# Patient Record
Sex: Male | Born: 1935 | Race: White | Hispanic: No | Marital: Married | Smoking: Former smoker
Health system: Southern US, Community
[De-identification: ages and names within clinical notes are randomized; demographics above are authoritative.]

## PROBLEM LIST (undated history)

## (undated) DIAGNOSIS — J90 Pleural effusion, not elsewhere classified: Secondary | ICD-10-CM

## (undated) DIAGNOSIS — I330 Acute and subacute infective endocarditis: Secondary | ICD-10-CM

## (undated) DIAGNOSIS — I456 Pre-excitation syndrome: Secondary | ICD-10-CM

## (undated) DIAGNOSIS — Z950 Presence of cardiac pacemaker: Secondary | ICD-10-CM

## (undated) DIAGNOSIS — D509 Iron deficiency anemia, unspecified: Secondary | ICD-10-CM

## (undated) DIAGNOSIS — E349 Endocrine disorder, unspecified: Secondary | ICD-10-CM

## (undated) DIAGNOSIS — Z951 Presence of aortocoronary bypass graft: Secondary | ICD-10-CM

## (undated) DIAGNOSIS — D693 Immune thrombocytopenic purpura: Secondary | ICD-10-CM

## (undated) DIAGNOSIS — Z954 Presence of other heart-valve replacement: Secondary | ICD-10-CM

## (undated) DIAGNOSIS — I351 Nonrheumatic aortic (valve) insufficiency: Secondary | ICD-10-CM

## (undated) DIAGNOSIS — G4733 Obstructive sleep apnea (adult) (pediatric): Secondary | ICD-10-CM

## (undated) DIAGNOSIS — R32 Unspecified urinary incontinence: Principal | ICD-10-CM

## (undated) DIAGNOSIS — Z9889 Other specified postprocedural states: Secondary | ICD-10-CM

## (undated) DIAGNOSIS — I5033 Acute on chronic diastolic (congestive) heart failure: Secondary | ICD-10-CM

## (undated) DIAGNOSIS — I10 Essential (primary) hypertension: Secondary | ICD-10-CM

## (undated) DIAGNOSIS — Z953 Presence of xenogenic heart valve: Secondary | ICD-10-CM

## (undated) DIAGNOSIS — E785 Hyperlipidemia, unspecified: Secondary | ICD-10-CM

## (undated) DIAGNOSIS — D61818 Other pancytopenia: Principal | ICD-10-CM

## (undated) DIAGNOSIS — R739 Hyperglycemia, unspecified: Secondary | ICD-10-CM

## (undated) DIAGNOSIS — Z952 Presence of prosthetic heart valve: Secondary | ICD-10-CM

## (undated) DIAGNOSIS — N189 Chronic kidney disease, unspecified: Secondary | ICD-10-CM

## (undated) DIAGNOSIS — E870 Hyperosmolality and hypernatremia: Secondary | ICD-10-CM

## (undated) DIAGNOSIS — I2510 Atherosclerotic heart disease of native coronary artery without angina pectoris: Secondary | ICD-10-CM

## (undated) DIAGNOSIS — M1990 Unspecified osteoarthritis, unspecified site: Secondary | ICD-10-CM

## (undated) DIAGNOSIS — N179 Acute kidney failure, unspecified: Secondary | ICD-10-CM

## (undated) DIAGNOSIS — Z8679 Personal history of other diseases of the circulatory system: Secondary | ICD-10-CM

## (undated) DIAGNOSIS — D72819 Decreased white blood cell count, unspecified: Secondary | ICD-10-CM

## (undated) DIAGNOSIS — N390 Urinary tract infection, site not specified: Secondary | ICD-10-CM

## (undated) DIAGNOSIS — K909 Intestinal malabsorption, unspecified: Secondary | ICD-10-CM

## (undated) DIAGNOSIS — R42 Dizziness and giddiness: Secondary | ICD-10-CM

## (undated) DIAGNOSIS — I219 Acute myocardial infarction, unspecified: Secondary | ICD-10-CM

## (undated) DIAGNOSIS — I441 Atrioventricular block, second degree: Secondary | ICD-10-CM

## (undated) DIAGNOSIS — N183 Chronic kidney disease, stage 3 (moderate): Secondary | ICD-10-CM

## (undated) DIAGNOSIS — I4891 Unspecified atrial fibrillation: Secondary | ICD-10-CM

## (undated) DIAGNOSIS — Z8619 Personal history of other infectious and parasitic diseases: Secondary | ICD-10-CM

## (undated) DIAGNOSIS — D649 Anemia, unspecified: Secondary | ICD-10-CM

## (undated) DIAGNOSIS — D696 Thrombocytopenia, unspecified: Secondary | ICD-10-CM

## (undated) HISTORY — DX: Personal history of other infectious and parasitic diseases: Z86.19

## (undated) HISTORY — DX: Endocrine disorder, unspecified: E34.9

## (undated) HISTORY — DX: Essential (primary) hypertension: I10

## (undated) HISTORY — DX: Other pancytopenia: D61.818

## (undated) HISTORY — PX: CORONARY ANGIOPLASTY WITH STENT PLACEMENT: SHX49

## (undated) HISTORY — DX: Intestinal malabsorption, unspecified: K90.9

## (undated) HISTORY — DX: Unspecified atrial fibrillation: I48.91

## (undated) HISTORY — DX: Decreased white blood cell count, unspecified: D72.819

## (undated) HISTORY — DX: Thrombocytopenia, unspecified: D69.6

## (undated) HISTORY — DX: Anemia, unspecified: D64.9

## (undated) HISTORY — DX: Obstructive sleep apnea (adult) (pediatric): G47.33

## (undated) HISTORY — DX: Iron deficiency anemia, unspecified: D50.9

## (undated) HISTORY — DX: Atrioventricular block, second degree: I44.1

## (undated) HISTORY — DX: Hyperosmolality and hypernatremia: E87.0

## (undated) HISTORY — DX: Pre-excitation syndrome: I45.6

## (undated) HISTORY — DX: Immune thrombocytopenic purpura: D69.3

## (undated) HISTORY — DX: Dizziness and giddiness: R42

## (undated) HISTORY — DX: Atherosclerotic heart disease of native coronary artery without angina pectoris: I25.10

## (undated) HISTORY — DX: Hyperglycemia, unspecified: R73.9

## (undated) HISTORY — DX: Unspecified urinary incontinence: R32

## (undated) HISTORY — PX: OTHER SURGICAL HISTORY: SHX169

## (undated) HISTORY — DX: Hyperlipidemia, unspecified: E78.5

---

## 1999-05-10 ENCOUNTER — Encounter: Payer: Self-pay | Admitting: Emergency Medicine

## 1999-05-10 ENCOUNTER — Inpatient Hospital Stay (HOSPITAL_COMMUNITY): Admission: EM | Admit: 1999-05-10 | Discharge: 1999-05-11 | Payer: Self-pay | Admitting: Emergency Medicine

## 1999-05-11 ENCOUNTER — Encounter: Payer: Self-pay | Admitting: Cardiovascular Disease

## 2005-09-27 LAB — HM COLONOSCOPY

## 2012-08-12 ENCOUNTER — Encounter: Payer: Self-pay | Admitting: Cardiology

## 2012-08-12 ENCOUNTER — Encounter: Payer: Self-pay | Admitting: *Deleted

## 2012-08-12 DIAGNOSIS — E785 Hyperlipidemia, unspecified: Secondary | ICD-10-CM | POA: Insufficient documentation

## 2012-08-12 DIAGNOSIS — I10 Essential (primary) hypertension: Secondary | ICD-10-CM | POA: Insufficient documentation

## 2012-08-12 DIAGNOSIS — I2510 Atherosclerotic heart disease of native coronary artery without angina pectoris: Secondary | ICD-10-CM | POA: Insufficient documentation

## 2012-08-13 ENCOUNTER — Ambulatory Visit (INDEPENDENT_AMBULATORY_CARE_PROVIDER_SITE_OTHER): Payer: Medicare Other | Admitting: Cardiology

## 2012-08-13 ENCOUNTER — Encounter: Payer: Self-pay | Admitting: Cardiology

## 2012-08-13 VITALS — BP 122/70 | HR 49 | Ht 71.0 in | Wt 189.0 lb

## 2012-08-13 DIAGNOSIS — E785 Hyperlipidemia, unspecified: Secondary | ICD-10-CM

## 2012-08-13 DIAGNOSIS — I10 Essential (primary) hypertension: Secondary | ICD-10-CM

## 2012-08-13 DIAGNOSIS — I679 Cerebrovascular disease, unspecified: Secondary | ICD-10-CM

## 2012-08-13 DIAGNOSIS — R002 Palpitations: Secondary | ICD-10-CM

## 2012-08-13 DIAGNOSIS — I2510 Atherosclerotic heart disease of native coronary artery without angina pectoris: Secondary | ICD-10-CM

## 2012-08-13 MED ORDER — NADOLOL 20 MG PO TABS: ORAL_TABLET | ORAL | Status: DC

## 2012-08-13 NOTE — Assessment & Plan Note (Signed)
If he has recurrent episodes in the future we'll plan CardioNet.

## 2012-08-13 NOTE — Assessment & Plan Note (Signed)
Continue aspirin and statin. Followup carotid Dopplers February 2014. 

## 2012-08-13 NOTE — Patient Instructions (Addendum)
Your physician wants you to follow-up in: 6 MONTHS WITH DR Jens Som You will receive a reminder letter in the mail two months in advance. If you don't receive a letter, please call our office to schedule the follow-up appointment.   DECREASE NADOLOL TO 5 MG ONCE DAILY= 1/4 OF 20 MG TABLET  Your physician has requested that you have a carotid duplex. This test is an ultrasound of the carotid arteries in your neck. It looks at blood flow through these arteries that supply the brain with blood. Allow one hour for this exam. There are no restrictions or special instructions. DUE IN [place]

## 2012-08-13 NOTE — Progress Notes (Signed)
HPI: 78 year old male for evaluation of coronary artery disease. Patient previously resided in [place] and recently moved here and would like to establish care. The patient had a previous LAD infarct and had PCI of his LAD in April of 1999. Stress echocardiogram in June of 2011 showed no ischemia at some pressure or heart rate with an ejection fraction of 55-60%. Carotid Dopplers in February 2013 showed less than 50% stenosis of the right internal carotid artery and less than 50% stenosis of the left. Patient has dyspnea with more extreme activities but not routine activities. No orthopnea, PND, pedal edema, claudication, syncope or chest pain. He has had one episode of palpitations that lasted several minutes and resolve spontaneously. No associated syncope.  Current Outpatient Prescriptions  Medication Sig Dispense Refill  . aspirin 325 MG tablet Take 325 mg by mouth daily.      Marland Kitchen atorvastatin (LIPITOR) 10 MG tablet Take 10 mg by mouth daily.      . Cholecalciferol (VITAMIN D3) 2000 UNITS TABS Take 1 tablet by mouth daily.      . Cyanocobalamin (VITAMIN B-12 CR PO) Take 1 tablet by mouth daily.      . Multiple Vitamin (MULTIVITAMIN) capsule Take 1 capsule by mouth daily.      . nadolol (CORGARD) 20 MG tablet 1/2 tab po qd      . nitroGLYCERIN (NITROSTAT) 0.4 MG SL tablet Place 0.4 mg under the tongue every 5 (five) minutes as needed.      . Pyridoxine HCl (VITAMIN B-6 PO) Take 1 tablet by mouth daily.        No Known Allergies  Past Medical History  Diagnosis Date  . CAD (coronary artery disease)   . Dyslipidemia   . HTN (hypertension)   . WPW (Wolff-Parkinson-White syndrome)     Past Surgical History  Procedure Date  . Coronary angioplasty with stent placement   . Vastectomy     History   Social History  . Marital Status: Married    Spouse Name: N/A    Number of Children: 3  . Years of Education: N/A   Occupational History  .      Retired   Social History Main Topics    . Smoking status: Former Games developer  . Smokeless tobacco: Not on file  . Alcohol Use: Yes     Occasional  . Drug Use: No  . Sexually Active: Not on file   Other Topics Concern  . Not on file   Social History Narrative  . No narrative on file    Family History  Problem Relation Age of Onset  . Hypertension Father   . Stroke Mother   . Coronary artery disease Sister     MI at age 36    ROS:  no fevers or chills, productive cough, hemoptysis, dysphasia, odynophagia, melena, hematochezia, dysuria, hematuria, rash, seizure activity, orthopnea, PND, pedal edema, claudication. Remaining systems are negative.  Physical Exam:   Blood pressure 122/70, pulse 49, height 5\' 11"  (1.803 m), weight 189 lb (85.73 kg).  General:  Well developed/well nourished in NAD Skin warm/dry Patient not depressed No peripheral clubbing Back-normal HEENT-normal/normal eyelids Neck supple/normal carotid upstroke bilaterally; no bruits; no JVD; no thyromegaly chest - CTA/ normal expansion CV - RRR/normal S1 and S2; no murmurs, rubs or gallops;  PMI nondisplaced Abdomen -NT/ND, no HSM, no mass, + bowel sounds, no bruit 2+ femoral pulses, no bruits Ext-no edema, chords, 2+ DP Neuro-grossly nonfocal  ECG marked sinus bradycardia at  a rate of 49. First degree AV block. Incomplete right bundle branch block. No significant ST changes.

## 2012-08-13 NOTE — Assessment & Plan Note (Signed)
Continue aspirin and statin. We'll plan functional study in one year.

## 2012-08-13 NOTE — Assessment & Plan Note (Signed)
Continue statin. 

## 2012-08-13 NOTE — Assessment & Plan Note (Signed)
Blood pressure is controlled. However he has a very long first degree AV block on his electrocardiogram and his heart rate is 49. Decrease nadolol to 5 mg daily. Follow blood pressure and add additional medications as needed.

## 2012-08-15 ENCOUNTER — Encounter: Payer: Self-pay | Admitting: Cardiology

## 2012-09-25 ENCOUNTER — Telehealth: Payer: Self-pay | Admitting: Cardiology

## 2012-09-25 MED ORDER — NITROGLYCERIN 0.4 MG SL SUBL: 0.4000 mg | SUBLINGUAL_TABLET | SUBLINGUAL | Status: DC | PRN

## 2012-09-25 NOTE — Telephone Encounter (Signed)
Pt needs refill of nitro, lost his bottle, uses [facility]

## 2012-09-27 ENCOUNTER — Other Ambulatory Visit: Payer: Self-pay | Admitting: Cardiology

## 2012-09-27 MED ORDER — NITROGLYCERIN 0.4 MG SL SUBL: 0.4000 mg | SUBLINGUAL_TABLET | SUBLINGUAL | Status: DC | PRN

## 2012-09-27 NOTE — Telephone Encounter (Signed)
Plz resend pt RX to [facility] ASAP, original request from 09/25/12 does not have a preferred pharmacy attached to the order, pt can be reached at hm# for additional info if needed

## 2012-10-01 ENCOUNTER — Telehealth: Payer: Self-pay | Admitting: Cardiology

## 2012-10-01 MED ORDER — NITROGLYCERIN 0.4 MG SL SUBL: 0.4000 mg | SUBLINGUAL_TABLET | SUBLINGUAL | Status: DC | PRN

## 2012-10-01 NOTE — Telephone Encounter (Signed)
k

## 2013-02-20 ENCOUNTER — Telehealth: Payer: Self-pay | Admitting: Cardiology

## 2013-02-20 NOTE — Telephone Encounter (Signed)
New problem   Pt need to speak with you concerning a form he received that he needed to have a carotid test done. Pt would like to speak to you about this.

## 2013-02-20 NOTE — Telephone Encounter (Signed)
Pt scheduled for carotid doppler study as ordered and f/u with Dr Jens Som in the [facility] office

## 2013-03-03 ENCOUNTER — Encounter (INDEPENDENT_AMBULATORY_CARE_PROVIDER_SITE_OTHER): Payer: Medicare Other

## 2013-03-03 DIAGNOSIS — I679 Cerebrovascular disease, unspecified: Secondary | ICD-10-CM

## 2013-03-05 ENCOUNTER — Ambulatory Visit (INDEPENDENT_AMBULATORY_CARE_PROVIDER_SITE_OTHER): Payer: Medicare Other | Admitting: Cardiology

## 2013-03-05 ENCOUNTER — Encounter: Payer: Self-pay | Admitting: Cardiology

## 2013-03-05 VITALS — BP 130/80 | HR 53 | Wt 192.0 lb

## 2013-03-05 DIAGNOSIS — R55 Syncope and collapse: Secondary | ICD-10-CM | POA: Insufficient documentation

## 2013-03-05 DIAGNOSIS — I10 Essential (primary) hypertension: Secondary | ICD-10-CM

## 2013-03-05 DIAGNOSIS — I2510 Atherosclerotic heart disease of native coronary artery without angina pectoris: Secondary | ICD-10-CM

## 2013-03-05 MED ORDER — ATORVASTATIN CALCIUM 10 MG PO TABS: 10.0000 mg | ORAL_TABLET | Freq: Every day | ORAL | Status: DC

## 2013-03-05 MED ORDER — AMLODIPINE BESYLATE 5 MG PO TABS: 5.0000 mg | ORAL_TABLET | Freq: Every day | ORAL | Status: DC

## 2013-03-05 NOTE — Assessment & Plan Note (Signed)
Etiology unclear. However I am concerned about the possibility of bradycardia mediated events. He has an extremely long first degree AV block on his electrocardiogram. Discontinue nadalol. Schedule CardioNet. I have asked him to allow his wife to drive while his monitor is in place.

## 2013-03-05 NOTE — Assessment & Plan Note (Addendum)
Continue aspirin and statin. Schedule stress Cardiolite for risk stratification.

## 2013-03-05 NOTE — Progress Notes (Signed)
HPI: Pleasant male for fu of coronary artery disease. The patient had a previous LAD infarct and had PCI of his LAD in April of 1999. Stress echocardiogram in June of 2011 showed no ischemia with an ejection fraction of 55-60%. Carotid Dopplers in February 2013 showed less than 50% stenosis of the right internal carotid artery and less than 50% stenosis of the left. I last saw him in Sept 2013. Since then, the patient has had presyncope recently. He had 3 separate episodes while in [place]. Each episode occurred suddenly and was described as pressure in his head with a machine sound in his ears. He felt like he was going to pass out and it would resolve in 2-3 seconds. He has not had frank syncope. He otherwise denies dyspnea on exertion, orthopnea, PND, pedal edema, palpitations.   Current Outpatient Prescriptions  Medication Sig Dispense Refill  . aspirin 325 MG tablet Take 325 mg by mouth daily.      Marland Kitchen atorvastatin (LIPITOR) 10 MG tablet Take 10 mg by mouth daily.      . Cholecalciferol (VITAMIN D3) 2000 UNITS TABS Take 1 tablet by mouth daily.      . Cyanocobalamin (VITAMIN B-12 CR PO) Take 1 tablet by mouth daily.      Marland Kitchen loratadine (CLARITIN) 10 MG tablet Take 10 mg by mouth daily.      . Multiple Vitamin (MULTIVITAMIN) capsule Take 1 capsule by mouth daily.      . nadolol (CORGARD) 20 MG tablet 1/4 tab po qd      . nitroGLYCERIN (NITROSTAT) 0.4 MG SL tablet Place 1 tablet (0.4 mg total) under the tongue every 5 (five) minutes as needed.  25 tablet  12  . Oxymetazoline HCl (NASAL SPRAY NA) Place into the nose as directed.      . Pyridoxine HCl (VITAMIN B-6 PO) Take 1 tablet by mouth daily.       No current facility-administered medications for this visit.     Past Medical History  Diagnosis Date  . CAD (coronary artery disease)   . Dyslipidemia   . HTN (hypertension)   . WPW (Wolff-Parkinson-White syndrome)     Past Surgical History  Procedure Laterality Date  . Coronary  angioplasty with stent placement    . Vastectomy      History   Social History  . Marital Status: Married    Spouse Name: N/A    Number of Children: 3  . Years of Education: N/A   Occupational History  .      Retired   Social History Main Topics  . Smoking status: Former Games developer  . Smokeless tobacco: Not on file  . Alcohol Use: Yes     Comment: Occasional  . Drug Use: No  . Sexually Active: Not on file   Other Topics Concern  . Not on file   Social History Narrative  . No narrative on file    ROS: no fevers or chills, productive cough, hemoptysis, dysphasia, odynophagia, melena, hematochezia, dysuria, hematuria, rash, seizure activity, orthopnea, PND, pedal edema, claudication. Remaining systems are negative.  Physical Exam: Well-developed well-nourished in no acute distress.  Skin is warm and dry.  HEENT is normal.  Neck is supple.  Chest is clear to auscultation with normal expansion.  Cardiovascular exam is regular rate and rhythm.  Abdominal exam nontender or distended. No masses palpated. Extremities show no edema. neuro grossly intact  ECG sinus rhythm at a rate of 53. First degree AV block.  RV conduction delay.

## 2013-03-05 NOTE — Assessment & Plan Note (Signed)
Discontinue beta blocker as outlined under near syncope. Instead treat with amlodipine 5 mg daily. Adjust regimen as needed.

## 2013-03-05 NOTE — Assessment & Plan Note (Signed)
Continue aspirin and statin. Patient had followup carotid Dopplers recently and we will await the final results.

## 2013-03-05 NOTE — Assessment & Plan Note (Signed)
Continue statin. 

## 2013-03-05 NOTE — Patient Instructions (Addendum)
Your physician recommends that you schedule a follow-up appointment in: 6- 8 WEEKS WITH DR CRENSHAW IN [place]  Your physician has requested that you have en exercise stress myoview. For further information please visit [email or web address] Please follow instruction sheet, as given.   STOP NADOLOL  START AMLODIPINE 5 MG ONCE DAILY  Your physician has recommended that you wear an event monitor. Event monitors are medical devices that record the heart's electrical activity. Doctors most often [place] these monitors to diagnose arrhythmias. Arrhythmias are problems with the speed or rhythm of the heartbeat. The monitor is a small, portable device. You can wear one while you do your normal daily activities. This is usually used to diagnose what is causing palpitations/syncope (passing out).

## 2013-03-10 ENCOUNTER — Telehealth: Payer: Self-pay | Admitting: Cardiology

## 2013-03-10 NOTE — Telephone Encounter (Signed)
Spoke with pt, aware unable to put monitor on today. He will come tomorrow morning for appointment

## 2013-03-10 NOTE — Telephone Encounter (Signed)
New problem     C/O  Heart rhythm . Wants to know can he get his heart monitor today.

## 2013-03-10 NOTE — Telephone Encounter (Signed)
Spoke with pt, he had a bad episode of palpitations last evening.  His pulse was irregular. He is going out of town tomorrow and wants to know if he can get the moniotr today. Will discuss with tara.

## 2013-03-11 ENCOUNTER — Telehealth: Payer: Self-pay | Admitting: *Deleted

## 2013-03-11 ENCOUNTER — Encounter (INDEPENDENT_AMBULATORY_CARE_PROVIDER_SITE_OTHER): Payer: Medicare Other

## 2013-03-11 ENCOUNTER — Encounter (HOSPITAL_COMMUNITY): Payer: Medicare Other

## 2013-03-11 DIAGNOSIS — R55 Syncope and collapse: Secondary | ICD-10-CM

## 2013-03-11 NOTE — Telephone Encounter (Signed)
21 day event monitor placed on Pt 03/11/13 TK

## 2013-03-14 ENCOUNTER — Telehealth: Payer: Self-pay | Admitting: *Deleted

## 2013-03-14 NOTE — Telephone Encounter (Signed)
Unable to reach pt or leave a message, e-cardio alert strips reviewed by dr Jens Som. 2nd degree AVB type ll noted. Need to make sure pt is not taking corgard. He also needs to see the EP doctors regarding pacemaker soon.

## 2013-03-18 NOTE — Telephone Encounter (Signed)
Left message for pt to call.

## 2013-03-19 NOTE — Telephone Encounter (Signed)
New Problem:    Patient called in returning your call. Please call back. 

## 2013-03-19 NOTE — Telephone Encounter (Signed)
Spoke with pt, he is aware of monitor strips. Follow up appt made for pt to see dr Graciela Husbands. He will call prior to appt with problems and will go to the ER for any episodes of syncope.

## 2013-03-20 ENCOUNTER — Ambulatory Visit (HOSPITAL_COMMUNITY): Payer: Medicare Other | Attending: Cardiology | Admitting: Radiology

## 2013-03-20 VITALS — BP 151/76 | HR 57 | Ht 71.0 in | Wt 192.0 lb

## 2013-03-20 DIAGNOSIS — Z87891 Personal history of nicotine dependence: Secondary | ICD-10-CM | POA: Insufficient documentation

## 2013-03-20 DIAGNOSIS — I4892 Unspecified atrial flutter: Secondary | ICD-10-CM

## 2013-03-20 DIAGNOSIS — R42 Dizziness and giddiness: Secondary | ICD-10-CM | POA: Insufficient documentation

## 2013-03-20 DIAGNOSIS — I10 Essential (primary) hypertension: Secondary | ICD-10-CM | POA: Insufficient documentation

## 2013-03-20 DIAGNOSIS — E785 Hyperlipidemia, unspecified: Secondary | ICD-10-CM | POA: Insufficient documentation

## 2013-03-20 DIAGNOSIS — Z8249 Family history of ischemic heart disease and other diseases of the circulatory system: Secondary | ICD-10-CM | POA: Insufficient documentation

## 2013-03-20 DIAGNOSIS — I252 Old myocardial infarction: Secondary | ICD-10-CM | POA: Insufficient documentation

## 2013-03-20 DIAGNOSIS — R002 Palpitations: Secondary | ICD-10-CM | POA: Insufficient documentation

## 2013-03-20 DIAGNOSIS — I2510 Atherosclerotic heart disease of native coronary artery without angina pectoris: Secondary | ICD-10-CM

## 2013-03-20 DIAGNOSIS — R55 Syncope and collapse: Secondary | ICD-10-CM | POA: Insufficient documentation

## 2013-03-20 DIAGNOSIS — I779 Disorder of arteries and arterioles, unspecified: Secondary | ICD-10-CM | POA: Insufficient documentation

## 2013-03-20 MED ORDER — TECHNETIUM TC 99M SESTAMIBI GENERIC - CARDIOLITE
30.0000 | Freq: Once | INTRAVENOUS | Status: AC | PRN
  Administered 2013-03-20: 30 via INTRAVENOUS

## 2013-03-20 MED ORDER — TECHNETIUM TC 99M SESTAMIBI GENERIC - CARDIOLITE
10.0000 | Freq: Once | INTRAVENOUS | Status: AC | PRN
  Administered 2013-03-20: 10 via INTRAVENOUS

## 2013-03-20 NOTE — Progress Notes (Signed)
[facility] SITE 3 NUCLEAR MED [address] [place], [place] [address] [phone]    Cardiology Nuclear Med Study  Justin [name] is a 79 y.o. male     MRN : [id number]     DOB: March 15, 1933  Procedure Date: 03/20/2013  Nuclear Med Background Indication for Stress Test:  Evaluation for Ischemia and Stent Patency History:  h/o WPW; '99 MI>Stent-LAD; '11 Stress Echo:no ischemia, EF=60% Cardiac Risk Factors: Carotid Disease, Family History - CAD, History of Smoking, Hypertension and Lipids  Symptoms:  Light-Headedness, Near Syncope, Palpitations and Rapid HR   Nuclear Pre-Procedure Caffeine/Decaff Intake:  None NPO After: 7:30pm   Lungs:  Clear. O2 Sat: 97% on room air. IV 0.9% NS with Angio Cath:  20g  IV Site: R Wrist  IV Started by:  Stanton Kidney, EMT-P  Chest Size (in):  42 Cup Size: n/a  Height: 5\' 11"  (1.803 m)  Weight:  192 lb (87.091 kg)  BMI:  Body mass index is 26.79 kg/(m^2). Tech Comments:  NA    Nuclear Med Study 1 or 2 day study: 1 day  Stress Test Type:  Stress  Reading MD: Marca Ancona, MD  Order Authorizing Provider:  Olga Millers, MD  Resting Radionuclide: Technetium 78m Sestamibi  Resting Radionuclide Dose: 11.0 mCi   Stress Radionuclide:  Technetium 31m Sestamibi  Stress Radionuclide Dose: 33.0 mCi           Stress Protocol Rest HR: 57 Stress HR: 126  Rest BP: 151/76 Stress BP: 201/55  Exercise Time (min): 8:03 METS: 10.1   Predicted Max HR: 142 bpm % Max HR: 88.73 bpm Rate Pressure Product: 96295   Dose of Adenosine (mg):  n/a Dose of Lexiscan: n/a mg  Dose of Atropine (mg): n/a Dose of Dobutamine: n/a mcg/kg/min (at max HR)  Stress Test Technologist: Smiley Houseman, CMA-N  Nuclear Technologist:  Domenic Polite, CNMT     Rest Procedure:  Myocardial perfusion imaging was performed at rest 45 minutes following the intravenous administration of Technetium 15m Sestamibi.  Rest ECG: NSR - Normal EKG  Stress Procedure:  The patient  exercised on the treadmill utilizing the Bruce Protocol for 8:03 minutes. The patient stopped due to fatigue and denied any chest pain.  Technetium 26m Sestamibi was injected at peak exercise and myocardial perfusion imaging was performed after a brief delay.  Stress EKG's were discussed with Dr. Myrtis Ser, DOD, and it was felt safe for the patient to leave and keep his follow up appointment with Dr. Berton Mount.  Stress ECG: Nonspecific changes.  Possible atypical atrial flutter in recovery.   QPS Raw Data Images:  Normal; no motion artifact; normal heart/lung ratio. Stress Images:  Normal homogeneous uptake in all areas of the myocardium. Rest Images:  Normal homogeneous uptake in all areas of the myocardium. Subtraction (SDS):  There is no evidence of scar or ischemia. Transient Ischemic Dilatation (Normal <1.22):  0.90 Lung/Heart Ratio (Normal <0.45):  0.28  Quantitative Gated Spect Images QGS EDV:  NA QGS ESV:  NA  Impression Exercise Capacity:  Fair exercise capacity. BP Response:  Hypertensive blood pressure response. Clinical Symptoms:  Fatigue, dyspnea.  ECG Impression:  Nonspecific ECG changes.  Possible atrial flutter in recovery.  Comparison with Prior Nuclear Study: No images to compare  Overall Impression:  No evidence for scar or ischemia.  Possible atypical atrial flutter in recovery.   LV Ejection Fraction: Study not gated.  LV Wall Motion: NA  Marca Ancona 03/20/2013

## 2013-04-03 ENCOUNTER — Encounter: Payer: Self-pay | Admitting: Internal Medicine

## 2013-04-03 ENCOUNTER — Ambulatory Visit (INDEPENDENT_AMBULATORY_CARE_PROVIDER_SITE_OTHER): Payer: Medicare Other | Admitting: Internal Medicine

## 2013-04-03 ENCOUNTER — Encounter: Payer: Self-pay | Admitting: *Deleted

## 2013-04-03 ENCOUNTER — Encounter (HOSPITAL_COMMUNITY): Payer: Self-pay | Admitting: Pharmacy Technician

## 2013-04-03 VITALS — BP 140/68 | HR 62 | Ht 71.0 in | Wt 195.0 lb

## 2013-04-03 DIAGNOSIS — I4891 Unspecified atrial fibrillation: Secondary | ICD-10-CM | POA: Insufficient documentation

## 2013-04-03 DIAGNOSIS — R002 Palpitations: Secondary | ICD-10-CM

## 2013-04-03 DIAGNOSIS — R42 Dizziness and giddiness: Secondary | ICD-10-CM

## 2013-04-03 DIAGNOSIS — R55 Syncope and collapse: Secondary | ICD-10-CM

## 2013-04-03 DIAGNOSIS — I441 Atrioventricular block, second degree: Secondary | ICD-10-CM

## 2013-04-03 DIAGNOSIS — I10 Essential (primary) hypertension: Secondary | ICD-10-CM

## 2013-04-03 LAB — BASIC METABOLIC PANEL
BUN: 23 mg/dL (ref 6–23)
Calcium: 9.3 mg/dL (ref 8.4–10.5)
Creatinine, Ser: 1.1 mg/dL (ref 0.4–1.5)
GFR: 66.69 mL/min (ref >60.00)
Glucose, Bld: 85 mg/dL (ref 70–99)

## 2013-04-03 LAB — CBC WITH DIFFERENTIAL/PLATELET
Basophils Absolute: 0 10*3/uL (ref 0.0–0.1)
Lymphocytes Relative: 29.5 % (ref 12.0–46.0)
Lymphs Abs: 1.3 10*3/uL (ref 0.7–4.0)
Monocytes Relative: 14.9 % — ABNORMAL HIGH (ref 3.0–12.0)
Neutrophils Relative %: 53.5 % (ref 43.0–77.0)
Platelets: 122 10*3/uL — ABNORMAL LOW (ref 150.0–400.0)
RDW: 13.1 % (ref 11.5–14.6)
WBC: 4.3 10*3/uL — ABNORMAL LOW (ref 4.5–10.5)

## 2013-04-03 NOTE — Assessment & Plan Note (Signed)
Patient has near syncope with documented symptomatic associated 2 to one block. This is not reversible. We have discussed pacemaker implantation as to the only therapeutic option. The benefits and risks were reviewed including but not limited to death,  perforation, infection, lead dislodgement and device malfunction.  The patient understands agrees and is willing to proceed. Dr. Ladona Ridgel is available next week while on vacation to do this.

## 2013-04-03 NOTE — Assessment & Plan Note (Signed)
The patient has identified atrial fibrillation. Thrombolic risk factors would make appropriate for him to be on anticoagulation. As such, I would recommend discontinuation of aspirin. We have reviewed NOAC therapy.specifically the use of apixaban. I will defer the final recommendation to Dr. Jens Som.

## 2013-04-03 NOTE — Assessment & Plan Note (Signed)
His symptoms of palpitations correlate with both PVCs and atrial fibrillation. If the latter is significantly problematic, antiarrhythmic therapy would be appropriate as rate control is already accomplished

## 2013-04-03 NOTE — Assessment & Plan Note (Signed)
Well controlled 

## 2013-04-03 NOTE — Assessment & Plan Note (Signed)
As above.

## 2013-04-03 NOTE — Progress Notes (Signed)
 ELECTROPHYSIOLOGY CONSULT NOTE  Patient ID: Justin [name], MRN: [id number], DOB/AGE: 77/09/1933 77 y.o. Admit date: (Not on file) Date of Consult: 04/03/2013  Primary Physician: SMITH,LYLE, MD Primary Cardiologist:  BC  Chief Complaint:  presyncope   HPI Justin [name] is a 79 y.o. male   Seen at the request of Dr. BC He is a history of coronary artery disease with prior LAD infarct and PCI of his LAD April 1999. Stress echo 2011 demonstrated no ischemia and ejection of 55-60%.  Has had recurrent episodes of presyncope.he has profound, greater than 400 ms,first degree AV block and he was given an event recorder. v QRS with normal  Event recorder demonstrated abrupt onset 2-1 heart block associated with symptomatic lightheadedness.   It also demonstrated atrial fibrillation associated with a sensation of irregularity which has been recurring symptom often associated with 4 or 5 hours of palpitations but not associated particularly with impairment of exercise performance. He has been on aspirin but not on anticoagulation. Thromboembolic risk factors are notable for age and Hypertenison for a CHADS-  score of two and a CHADS-VASc score of 4    Past Medical History  Diagnosis Date  . CAD (coronary artery disease) prior stenting 1999   . Dyslipidemia   . HTN (hypertension)   . WPW (Wolff-Parkinson-White syndrome) loss of preexcitation   . Atrial fibrillation   . AV block, Mobitz II   . Presyncope       Surgical History:  Past Surgical History  Procedure Laterality Date  . Coronary angioplasty with stent placement    . Vastectomy       Home Meds: Prior to Admission medications   Medication Sig Start Date End Date Taking? Authorizing Provider  amLODipine (NORVASC) 5 MG tablet Take 1 tablet (5 mg total) by mouth daily. 03/05/13 03/05/14 Yes Brian S Crenshaw, MD  aspirin 325 MG tablet Take 325 mg by mouth daily.   Yes Historical Provider, MD  atorvastatin (LIPITOR) 10 MG tablet  Take 1 tablet (10 mg total) by mouth daily. 03/05/13  Yes Brian S Crenshaw, MD  Cholecalciferol (VITAMIN D3) 2000 UNITS TABS Take 1 tablet by mouth daily.   Yes Historical Provider, MD  Cyanocobalamin (VITAMIN B-12 CR PO) Take 1 tablet by mouth daily.   Yes Historical Provider, MD  loratadine (CLARITIN) 10 MG tablet Take 10 mg by mouth daily.   Yes Historical Provider, MD  Multiple Vitamin (MULTIVITAMIN) capsule Take 1 capsule by mouth daily.   Yes Historical Provider, MD  nitroGLYCERIN (NITROSTAT) 0.4 MG SL tablet Place 1 tablet (0.4 mg total) under the tongue every 5 (five) minutes as needed. 10/01/12  Yes Brian S Crenshaw, MD  Oxymetazoline HCl (NASAL SPRAY NA) Place into the nose as directed.   Yes Historical Provider, MD  Pyridoxine HCl (VITAMIN B-6 PO) Take 1 tablet by mouth daily.   Yes Historical Provider, MD     Allergies: No Known Allergies  History   Social History  . Marital Status: Married    Spouse Name: N/A    Number of Children: 3  . Years of Education: N/A   Occupational History  .      Retired   Social History Main Topics  . Smoking status: Former Smoker  . Smokeless tobacco: Not on file  . Alcohol Use: Yes     Comment: Occasional  . Drug Use: No  . Sexually Active: Not on file   Other Topics Concern  . Not on file     Social History Narrative  . No narrative on file     Family History  Problem Relation Age of Onset  . Hypertension Father   . Stroke Mother   . Coronary artery disease Sister     MI at age 80     ROS:  Please see the history of present illness.   a  All other systems reviewed and negative.    Physical Exam:   Blood pressure 140/68, pulse 62, height 5' 11" (1.803 m), weight 195 lb (88.451 kg). General: Well developed, well nourished male in no acute distress. Head: Normocephalic, atraumatic, sclera non-icteric, no xanthomas, nares are without discharge. EENT: normal Lymph Nodes:  none Back: without scoliosis/kyphosis , no CVA  tendersness Neck: Negative for carotid bruits. JVD not elevated. Lungs: Clear bilaterally to auscultation without wheezes, rales, or rhonchi. Breathing is unlabored. Heart: RRR with soft S1 S2. No murmur , rubs, or gallops appreciated. Abdomen: Soft, non-tender, non-distended with normoactive bowel sounds. No hepatomegaly. No rebound/guarding. No obvious abdominal masses. Msk:  Strength and tone appear normal for age. Extremities: No clubbing or cyanosis. No edema.  Distal pedal pulses are 2+ and equal bilaterally. Skin: Warm and Dry Neuro: Alert and oriented X 3. CN III-XII intact Grossly normal sensory and motor function . Psych:  Responds to questions appropriately with a normal affect.       EKG:   Sinus rhythm with first degree AV block at 440 ms with a narrow QRS  Event recorder demonstrated atrial fibrillation with controlled ventricular response, atrial flutter, PVCs, and abrupt onset of 21 AV block associated with symptoms  Assessment and Plan:    Livan Hires     

## 2013-04-03 NOTE — Patient Instructions (Addendum)
Your physician recommends that you return for lab work drawn today - BMP, CBC  Your physician has recommended that you have a pacemaker inserted. A pacemaker is a small device that is placed under the skin of your chest or abdomen to help control abnormal heart rhythms. This device uses electrical pulses to prompt the heart to beat at a normal rate. Pacemakers are used to treat heart rhythms that are too slow. Wire (leads) are attached to the pacemaker that goes into the chambers of you heart. This is done in the hospital and usually requires and overnight stay. Please see the instruction sheet given to you today for more information.

## 2013-04-04 ENCOUNTER — Telehealth: Payer: Self-pay | Admitting: *Deleted

## 2013-04-04 DIAGNOSIS — R0989 Other specified symptoms and signs involving the circulatory and respiratory systems: Secondary | ICD-10-CM

## 2013-04-04 NOTE — Telephone Encounter (Signed)
Per dr Jens Som , pt had abdominal bruit when seen by dr Graciela Husbands. Will check abdominal aorta for aneurysm due to family hx of AAA. Pt made aware and ultrasound scheduled.

## 2013-04-06 MED ORDER — CEFAZOLIN SODIUM-DEXTROSE 2-3 GM-% IV SOLR
2.0000 g | INTRAVENOUS | Status: DC
  Filled 2013-04-06: qty 50

## 2013-04-06 MED ORDER — SODIUM CHLORIDE 0.9 % IR SOLN
80.0000 mg | Status: DC
  Filled 2013-04-06 (×2): qty 2

## 2013-04-07 ENCOUNTER — Ambulatory Visit (HOSPITAL_COMMUNITY)
Admission: RE | Admit: 2013-04-07 | Discharge: 2013-04-08 | Disposition: A | Discharge status: Home / Self Care | Payer: Medicare Other | Source: Ambulatory Visit | Attending: Internal Medicine | Admitting: Internal Medicine

## 2013-04-07 ENCOUNTER — Encounter (HOSPITAL_COMMUNITY): Payer: Self-pay | Admitting: General Practice

## 2013-04-07 ENCOUNTER — Ambulatory Visit (HOSPITAL_COMMUNITY): Payer: Medicare Other

## 2013-04-07 ENCOUNTER — Encounter (HOSPITAL_COMMUNITY)
Admission: RE | Disposition: A | Discharge status: Home / Self Care | Payer: Self-pay | Source: Ambulatory Visit | Attending: Internal Medicine

## 2013-04-07 DIAGNOSIS — Z950 Presence of cardiac pacemaker: Secondary | ICD-10-CM

## 2013-04-07 DIAGNOSIS — I441 Atrioventricular block, second degree: Secondary | ICD-10-CM

## 2013-04-07 DIAGNOSIS — I10 Essential (primary) hypertension: Secondary | ICD-10-CM | POA: Insufficient documentation

## 2013-04-07 DIAGNOSIS — I456 Pre-excitation syndrome: Secondary | ICD-10-CM | POA: Insufficient documentation

## 2013-04-07 DIAGNOSIS — E785 Hyperlipidemia, unspecified: Secondary | ICD-10-CM | POA: Insufficient documentation

## 2013-04-07 DIAGNOSIS — I4891 Unspecified atrial fibrillation: Secondary | ICD-10-CM | POA: Insufficient documentation

## 2013-04-07 DIAGNOSIS — I2510 Atherosclerotic heart disease of native coronary artery without angina pectoris: Secondary | ICD-10-CM | POA: Insufficient documentation

## 2013-04-07 DIAGNOSIS — I252 Old myocardial infarction: Secondary | ICD-10-CM | POA: Insufficient documentation

## 2013-04-07 DIAGNOSIS — Z9861 Coronary angioplasty status: Secondary | ICD-10-CM | POA: Insufficient documentation

## 2013-04-07 HISTORY — DX: Unspecified osteoarthritis, unspecified site: M19.90

## 2013-04-07 HISTORY — DX: Presence of cardiac pacemaker: Z95.0

## 2013-04-07 HISTORY — PX: PERMANENT PACEMAKER INSERTION: SHX5480

## 2013-04-07 HISTORY — PX: INSERT / REPLACE / REMOVE PACEMAKER: SUR710

## 2013-04-07 HISTORY — DX: Acute myocardial infarction, unspecified: I21.9

## 2013-04-07 SURGERY — PERMANENT PACEMAKER INSERTION
Anesthesia: LOCAL

## 2013-04-07 MED ORDER — CEFAZOLIN SODIUM-DEXTROSE 2-3 GM-% IV SOLR
2.0000 g | Freq: Four times a day (QID) | INTRAVENOUS | Status: AC
  Administered 2013-04-07 – 2013-04-08 (×3): 2 g via INTRAVENOUS
  Filled 2013-04-07 (×3): qty 50

## 2013-04-07 MED ORDER — SODIUM CHLORIDE 0.9 % IV SOLN
INTRAVENOUS | Status: DC
  Administered 2013-04-07: 06:00:00 via INTRAVENOUS

## 2013-04-07 MED ORDER — VITAMIN B-12 1000 MCG PO TABS
1000.0000 ug | ORAL_TABLET | Freq: Every evening | ORAL | Status: DC
  Filled 2013-04-07 (×2): qty 1

## 2013-04-07 MED ORDER — ATORVASTATIN CALCIUM 10 MG PO TABS
10.0000 mg | ORAL_TABLET | Freq: Every day | ORAL | Status: DC
Start: 2013-04-07 — End: 2013-04-08
  Administered 2013-04-07: 23:00:00 10 mg via ORAL
  Filled 2013-04-07 (×3): qty 1

## 2013-04-07 MED ORDER — ONDANSETRON HCL 4 MG/2ML IJ SOLN: 4.0000 mg | Freq: Four times a day (QID) | INTRAMUSCULAR | Status: DC | PRN

## 2013-04-07 MED ORDER — ASPIRIN 325 MG PO TABS
325.0000 mg | ORAL_TABLET | Freq: Every day | ORAL | Status: DC
  Filled 2013-04-07 (×2): qty 1

## 2013-04-07 MED ORDER — CHLORHEXIDINE GLUCONATE 4 % EX LIQD: 60.0000 mL | Freq: Once | CUTANEOUS | Status: DC

## 2013-04-07 MED ORDER — ADULT MULTIVITAMIN W/MINERALS CH
1.0000 | ORAL_TABLET | Freq: Every day | ORAL | Status: DC
  Filled 2013-04-07 (×2): qty 1

## 2013-04-07 MED ORDER — FENTANYL CITRATE 0.05 MG/ML IJ SOLN
INTRAMUSCULAR | Status: AC
  Filled 2013-04-07: qty 2

## 2013-04-07 MED ORDER — NITROGLYCERIN 0.4 MG SL SUBL: 0.4000 mg | SUBLINGUAL_TABLET | SUBLINGUAL | Status: DC | PRN

## 2013-04-07 MED ORDER — ACETAMINOPHEN 325 MG PO TABS
325.0000 mg | ORAL_TABLET | ORAL | Status: DC | PRN
  Administered 2013-04-07: 22:00:00 650 mg via ORAL
  Filled 2013-04-07: qty 2

## 2013-04-07 MED ORDER — VITAMIN B-6 100 MG PO TABS
100.0000 mg | ORAL_TABLET | Freq: Every evening | ORAL | Status: DC
  Filled 2013-04-07 (×2): qty 1

## 2013-04-07 MED ORDER — YOU HAVE A PACEMAKER BOOK
Freq: Once | Status: AC
  Administered 2013-04-07: 12:00:00
  Filled 2013-04-07: qty 1

## 2013-04-07 MED ORDER — LORATADINE 10 MG PO TABS
10.0000 mg | ORAL_TABLET | Freq: Every day | ORAL | Status: DC | PRN
  Filled 2013-04-07: qty 1

## 2013-04-07 MED ORDER — MUPIROCIN 2 % EX OINT
TOPICAL_OINTMENT | Freq: Two times a day (BID) | CUTANEOUS | Status: DC
  Administered 2013-04-07: 06:00:00 via NASAL

## 2013-04-07 MED ORDER — MIDAZOLAM HCL 5 MG/5ML IJ SOLN
INTRAMUSCULAR | Status: AC
  Filled 2013-04-07: qty 5

## 2013-04-07 MED ORDER — AMLODIPINE BESYLATE 5 MG PO TABS
5.0000 mg | ORAL_TABLET | Freq: Every day | ORAL | Status: DC
  Administered 2013-04-07 – 2013-04-08 (×2): 5 mg via ORAL
  Filled 2013-04-07 (×3): qty 1

## 2013-04-07 NOTE — H&P (View-Only) (Signed)
ELECTROPHYSIOLOGY CONSULT NOTE  Patient ID: Justin [name], MRN: [id number], DOB/AGE: Dec 30, 1932 79 y.o. Admit date: (Not on file) Date of Consult: 04/03/2013  Primary Physician: Jari Favre, MD Primary Cardiologist:  [facility]  Chief Complaint:  presyncope   HPI Justin [name] is a 79 y.o. male   Seen at the request of Dr. Marsa Aris He is a history of coronary artery disease with prior LAD infarct and PCI of his LAD April 1999. Stress echo 2011 demonstrated no ischemia and ejection of 55-60%.  Has had recurrent episodes of presyncope.he has profound, greater than 400 ms,first degree AV block and he was given an event recorder. v QRS with normal  Event recorder demonstrated abrupt onset 2-1 heart block associated with symptomatic lightheadedness.   It also demonstrated atrial fibrillation associated with a sensation of irregularity which has been recurring symptom often associated with 4 or 5 hours of palpitations but not associated particularly with impairment of exercise performance. He has been on aspirin but not on anticoagulation. Thromboembolic risk factors are notable for age and Hypertenison for a CHADS-  score of two and a CHADS-VASc score of 4    Past Medical History  Diagnosis Date  . CAD (coronary artery disease) prior stenting 1999   . Dyslipidemia   . HTN (hypertension)   . WPW (Wolff-Parkinson-White syndrome) loss of preexcitation   . Atrial fibrillation   . AV block, Mobitz II   . Presyncope       Surgical History:  Past Surgical History  Procedure Laterality Date  . Coronary angioplasty with stent placement    . Vastectomy       Home Meds: Prior to Admission medications   Medication Sig Start Date End Date Taking? Authorizing Provider  amLODipine (NORVASC) 5 MG tablet Take 1 tablet (5 mg total) by mouth daily. 03/05/13 03/05/14 Yes Lewayne Bunting, MD  aspirin 325 MG tablet Take 325 mg by mouth daily.   Yes Historical Provider, MD  atorvastatin (LIPITOR) 10 MG tablet  Take 1 tablet (10 mg total) by mouth daily. 03/05/13  Yes Lewayne Bunting, MD  Cholecalciferol (VITAMIN D3) 2000 UNITS TABS Take 1 tablet by mouth daily.   Yes Historical Provider, MD  Cyanocobalamin (VITAMIN B-12 CR PO) Take 1 tablet by mouth daily.   Yes Historical Provider, MD  loratadine (CLARITIN) 10 MG tablet Take 10 mg by mouth daily.   Yes Historical Provider, MD  Multiple Vitamin (MULTIVITAMIN) capsule Take 1 capsule by mouth daily.   Yes Historical Provider, MD  nitroGLYCERIN (NITROSTAT) 0.4 MG SL tablet Place 1 tablet (0.4 mg total) under the tongue every 5 (five) minutes as needed. 10/01/12  Yes Lewayne Bunting, MD  Oxymetazoline HCl (NASAL SPRAY NA) Place into the nose as directed.   Yes Historical Provider, MD  Pyridoxine HCl (VITAMIN B-6 PO) Take 1 tablet by mouth daily.   Yes Historical Provider, MD     Allergies: No Known Allergies  History   Social History  . Marital Status: Married    Spouse Name: N/A    Number of Children: 3  . Years of Education: N/A   Occupational History  .      Retired   Social History Main Topics  . Smoking status: Former Games developer  . Smokeless tobacco: Not on file  . Alcohol Use: Yes     Comment: Occasional  . Drug Use: No  . Sexually Active: Not on file   Other Topics Concern  . Not on file  Social History Narrative  . No narrative on file     Family History  Problem Relation Age of Onset  . Hypertension Father   . Stroke Mother   . Coronary artery disease Sister     MI at age 18     ROS:  Please see the history of present illness.   a  All other systems reviewed and negative.    Physical Exam:   Blood pressure 140/68, pulse 62, height 5\' 11"  (1.803 m), weight 195 lb (88.451 kg). General: Well developed, well nourished male in no acute distress. Head: Normocephalic, atraumatic, sclera non-icteric, no xanthomas, nares are without discharge. EENT: normal Lymph Nodes:  none Back: without scoliosis/kyphosis , no CVA  tendersness Neck: Negative for carotid bruits. JVD not elevated. Lungs: Clear bilaterally to auscultation without wheezes, rales, or rhonchi. Breathing is unlabored. Heart: RRR with soft S1 S2. No murmur , rubs, or gallops appreciated. Abdomen: Soft, non-tender, non-distended with normoactive bowel sounds. No hepatomegaly. No rebound/guarding. No obvious abdominal masses. Msk:  Strength and tone appear normal for age. Extremities: No clubbing or cyanosis. No edema.  Distal pedal pulses are 2+ and equal bilaterally. Skin: Warm and Dry Neuro: Alert and oriented X 3. CN III-XII intact Grossly normal sensory and motor function . Psych:  Responds to questions appropriately with a normal affect.       EKG:   Sinus rhythm with first degree AV block at 440 ms with a narrow QRS  Event recorder demonstrated atrial fibrillation with controlled ventricular response, atrial flutter, PVCs, and abrupt onset of 21 AV block associated with symptoms  Assessment and Plan:    Sherryl Manges

## 2013-04-07 NOTE — Progress Notes (Signed)
Utilization Review Completed Panfilo Ketchum J. Karita Dralle, RN, BSN, NCM [phone]  

## 2013-04-07 NOTE — Op Note (Signed)
DDD PPM insertion via the left subclavian vein without immediate complication. [id number].

## 2013-04-07 NOTE — Interval H&P Note (Signed)
History and Physical Interval Note:  04/07/2013 7:35 AM  Justin [name]  has presented today for surgery, with the diagnosis of 2nd degree AV block  The various methods of treatment have been discussed with the patient and family. After consideration of risks, benefits and other options for treatment, the patient has consented to  Procedure(s): PERMANENT PACEMAKER INSERTION (N/A) as a surgical intervention .  The patient's history has been reviewed, patient examined, no change in status, stable for surgery.  I have reviewed the patient's chart and labs.  Questions were answered to the patient's satisfaction.     Leonia Reeves.D.

## 2013-04-08 ENCOUNTER — Ambulatory Visit (HOSPITAL_COMMUNITY): Payer: Medicare Other

## 2013-04-08 MED FILL — Heparin Sodium (Porcine) 2 Unit/ML in Sodium Chloride 0.9%: INTRAMUSCULAR | Qty: 500 | Status: AC

## 2013-04-08 MED FILL — Mupirocin Oint 2%: CUTANEOUS | Qty: 22 | Status: AC

## 2013-04-08 NOTE — Op Note (Signed)
[name], Justin NO.:  [id number]  MEDICAL RECORD NO.:  [id number]  LOCATION:  6529                         FACILITY:  [facility]  PHYSICIAN:  Doylene Canning. Ladona Ridgel, MD    DATE OF BIRTH:  1933/10/01  DATE OF PROCEDURE:  04/07/2013 DATE OF DISCHARGE:                              OPERATIVE REPORT   PROCEDURE PERFORMED:  Insertion of a dual-chamber pacemaker.  INDICATION:  Symptomatic 2:1 heart block.  INTRODUCTION:  The patient is a 79 year old man who has a history of weakness, fatigue, and was subsequently found to have symptomatic 2:1 heart block.  He had no reversible causes for his heart block, specifically was not on any AV nodal blocking drugs.  He is now referred for insertion of a dual-chamber pacemaker insertion.  PROCEDURE:  After informed consent was obtained, the patient was taken to the Diagnostic EP Lab in the fasting state.  After usual preparation and draping, intravenous fentanyl and midazolam was given for sedation. A 30 mL of lidocaine was infiltrated into the left infraclavicular region.  A 5-cm incision was carried out over this region and electrocautery was utilized to dissect down to the fascial plane.  The left subclavian vein was punctured x2 and a Medtronic model 5076 58-cm active fixation pacing lead, serial number [id number] was advanced into the right ventricle and the Medtronic model 5076 52-cm active fixation pacing lead, serial number [id number] was advanced to the right atrium. Mapping was then carried out on the right ventricle.  At the final site, the R-waves were 18, the pacing impedance 1000 ohms and the threshold with the lead actively fixed was 0.8 volts at 0.5 milliseconds.  There was large injury current.  There was no diaphragmatic stimulation with 10-volt pacing.  The right ventricular lead in satisfactory position, attention was then turned to the atrial lead, was placed in the anterolateral portion of the right atrium  where the P-waves measured 4 millivolts, the pacing impedance with the lead actively fixed was 700 ohms and threshold was initially 1.3 volts at 0.5 milliseconds.  There was a modest injury current.  A 10-volt pacing did not stimulate the diaphragm.  With these satisfactory parameters, the leads were secured to the subpectoral fascia with a figure-of-eight silk suture.  The sewing sleeve was secured with silk suture.  Electrocautery was utilized to make a subcutaneous pocket.  Antibiotic irrigation was utilized to irrigate the pocket and electrocautery was utilized to assure hemostasis.  The Medtronic Adapta L dual-chamber pacemaker, serial number [id number] was connected to the atrial and ventricular leads and placed back in the subcutaneous pocket.  The pocket was irrigated with antibiotic irrigation and incision was closed with 2-0 and 3-0 Vicryl. Benzoin and Steri-Strips were painted on the skin.  A pressure dressing was applied.  The patient was returned to his room in satisfactory condition.  COMPLICATIONS:  There were no immediate procedure complications.  RESULTS:  This demonstrate successful implantation of a Medtronic dual- chamber pacemaker in the patient with symptomatic 2:1 heart block with no reversible causes.     Doylene Canning. Ladona Ridgel, MD     GWT/MEDQ  D:  04/07/2013  T:  04/08/2013  Job:  [id number]  cc:   Duke Salvia, MD, [facility]

## 2013-04-08 NOTE — Discharge Summary (Signed)
ELECTROPHYSIOLOGY DISCHARGE SUMMARY    Patient ID: Justin [name],  MRN: [id number], DOB/AGE: 1933/02/04 79 y.o.  Admit date: 04/07/2013 Discharge date: 04/08/2013  Primary Care Physician: Jari Favre, MD Primary Cardiologist / EP: Jens Som, MD / Graciela Husbands, MD  Primary Discharge Diagnosis:  1. Symptomatic 2:1 AV block s/p PPM implantation  Secondary Discharge Diagnoses:  1. CAD 2. PAF 3. Wolff-Parkinson White syndrome 4. Dyslipidemia 5. HTN  Procedures This Admission:  1. Dual chamber PPM implantation  RA lead - Medtronic model 5076 52-cm active fixation pacing lead, serial number [id number] RV lead - Medtronic model 5076 58-cm active fixation pacing lead, serial number [id number]  Device - Medtronic Adapta L dual-chamber pacemaker, serial number [id number]   History and Hospital Course:  Mr. Cromwell is a 79 year old man who has a history of weakness, fatigue, and was subsequently found to have symptomatic 2:1 heart block. He had no reversible causes for his heart block, specifically was not on any AV nodal blocking drugs. He presented yesterday for insertion of a dual-chamber pacemaker. Mr. Arocho tolerated this procedure well without any immediate complication. He remains hemodynamically stable and afebrile. His chest xray shows stable lead placement without pneumothorax. His device interrogation shows normal PPM function with stable lead parameters/measurements. His implant site is intact without significant bleeding or hematoma. He has been given discharge instructions including wound care and activity restrictions. He will follow-up in 10 days for wound check. There were no changes made to his medications. Of note, Mr. Haskell CHADS2-VASc score is 4 and Dr. Graciela Husbands recommended anticoagulation. According to his office note, he discussed NOACs with Mr. Stock; however, he deferred the final decision to his primary cardiologist, Dr. Jens Som, whom he will see in follow-up in 2 weeks. He has been  seen, examined and deemed stable for discharge today by Dr. Lewayne Bunting.  Physical Exam: Vitals: Blood pressure 131/79, pulse 66, temperature 98.1 F (36.7 C), temperature source Oral, resp. rate 18, height 5\' 11"  (1.803 m), weight 190 lb 4.1 oz (86.3 kg), SpO2 97.00%.  General: WD, WN 79 year old male in no acute distress. Neck: Supple. No JVD. Heart: RRR. No murmur, rub or S3.  Lungs: CTA bilaterally. No wheezes, rales or rhonchi. Abdomen: Soft, nondistended. Extremities: No cyanosis, clubbing or edema. Skin: Left upper chest intact/implant site intact without bleeding or hematoma.  Labs: Lab Results  Component Value Date   WBC 4.3* 04/03/2013   HGB 13.6 04/03/2013   HCT 39.2 04/03/2013   MCV 98.1 04/03/2013   PLT 122.0* 04/03/2013     Recent Labs Lab 04/03/13 1111  NA 137  K 4.3  CL 106  CO2 28  BUN 23  CREATININE 1.1  CALCIUM 9.3  GLUCOSE 85    Disposition:  The patient is being discharged in stable condition.  Follow-up:     Follow-up Information   Follow up with [facility] On 04/14/2013. (At 11:30 AM for wound check)    Contact information:   Architectural technologist - [place] [address] [place] [address] [phone]      Follow up with Sherryl Manges, MD On 07/14/2013. (At 9:00 AM)    Contact information:   Architectural technologist - [place] [address] [place] [address] [phone]      Follow up with Olga Millers, MD On 04/23/2013. (At 11:30 AM)    Contact information:   Pharmacist, hospital  [address] [employer] [address] [place]  [address] [phone]      Follow up with [facility] On 04/14/2013. (At 8:00 AM)    Contact information:   [address] [place] [address] [phone]     Discharge Medications:    Medication List    TAKE these medications       amLODipine 5 MG tablet  Commonly known as:  NORVASC  Take 5 mg by mouth every morning.     aspirin 325  MG tablet  Take 325 mg by mouth every morning.     atorvastatin 10 MG tablet  Commonly known as:  LIPITOR  Take 10 mg by mouth every evening.     FINACEA 15 % cream  Generic drug:  Azelaic Acid  Apply 1 application topically daily. After skin is thoroughly washed and patted dry, gently but thoroughly massage a thin film of azelaic acid creame     fluticasone 50 MCG/ACT nasal spray  Commonly known as:  FLONASE  Place 1 spray into the nose daily as needed for allergies. For seasonal allergies     loratadine 10 MG tablet  Commonly known as:  CLARITIN  Take 10 mg by mouth daily as needed for allergies. For seasonal allergies     multivitamin with minerals Tabs  Take 1 tablet by mouth daily.     nitroGLYCERIN 0.4 MG SL tablet  Commonly known as:  NITROSTAT  Place 0.4 mg under the tongue every 5 (five) minutes as needed for chest pain. x3 doses as needed for chest pain     pyridOXINE 100 MG tablet  Commonly known as:  VITAMIN B-6  Take 100 mg by mouth every evening.     vitamin B-12 1000 MCG tablet  Commonly known as:  CYANOCOBALAMIN  Take 1,000 mcg by mouth every evening.     Vitamin D3 2000 UNITS Tabs  Take 1 tablet by mouth every evening.       Duration of Discharge Encounter: Greater than 30 minutes including physician time.  Signed, Rick Duff, PA-C 04/08/2013, 8:18 AM  EP Attending  Patient seen and examined. Agree with above. Ok to discharge home with usual followup.  Leonia Reeves.D.

## 2013-04-14 ENCOUNTER — Encounter: Payer: Self-pay | Admitting: Internal Medicine

## 2013-04-14 ENCOUNTER — Ambulatory Visit (INDEPENDENT_AMBULATORY_CARE_PROVIDER_SITE_OTHER): Payer: Medicare Other | Admitting: *Deleted

## 2013-04-14 ENCOUNTER — Other Ambulatory Visit: Payer: Self-pay

## 2013-04-14 ENCOUNTER — Encounter (INDEPENDENT_AMBULATORY_CARE_PROVIDER_SITE_OTHER): Payer: Medicare Other

## 2013-04-14 DIAGNOSIS — I700 Atherosclerosis of aorta: Secondary | ICD-10-CM

## 2013-04-14 DIAGNOSIS — I441 Atrioventricular block, second degree: Secondary | ICD-10-CM

## 2013-04-14 DIAGNOSIS — I4891 Unspecified atrial fibrillation: Secondary | ICD-10-CM

## 2013-04-14 DIAGNOSIS — R0989 Other specified symptoms and signs involving the circulatory and respiratory systems: Secondary | ICD-10-CM

## 2013-04-14 LAB — PACEMAKER DEVICE OBSERVATION
AL AMPLITUDE: 5.6 mv
AL IMPEDENCE PM: 572 Ohm
BAMS-0001: 150 {beats}/min
BATTERY VOLTAGE: 2.8 V
BATTERY VOLTAGE: 2.8 V
RV LEAD AMPLITUDE: 15.68 mv
RV LEAD AMPLITUDE: 15.68 mv
RV LEAD THRESHOLD: 0.75 V
VENTRICULAR PACING PM: 100

## 2013-04-14 NOTE — Progress Notes (Signed)
Wound check ppm in clinic. Normal device function. Battery longevity 10.5 years. ROV 07-22-13 with SK.

## 2013-04-15 ENCOUNTER — Telehealth: Payer: Self-pay | Admitting: Cardiology

## 2013-04-15 NOTE — Telephone Encounter (Signed)
Left message for pt to call.

## 2013-04-15 NOTE — Telephone Encounter (Signed)
Spoke with pt, he recently had a pacer inserted and was told they were also going to give him anticoagulant for atrial fib. He has not heard anything and he restarted his aspirin 325 mg yesterday. He has a follow up appt with dr Jens Som 04-23-13. He wanted to know should he cont the asa until then or does he need to change. Will forward for dr Jens Som review

## 2013-04-15 NOTE — Telephone Encounter (Signed)
New problem   Pt need to talk to you concerning his medications. Please call pt.

## 2013-04-15 NOTE — Telephone Encounter (Signed)
Will address at upcoming ov after looking at pacer site [facility]

## 2013-04-16 ENCOUNTER — Ambulatory Visit: Payer: Medicare Other

## 2013-04-16 NOTE — Telephone Encounter (Signed)
Spoke with pt, Aware of dr crenshaw's recommendations.  °

## 2013-04-23 ENCOUNTER — Telehealth: Payer: Self-pay | Admitting: Internal Medicine

## 2013-04-23 ENCOUNTER — Encounter: Payer: Self-pay | Admitting: Cardiology

## 2013-04-23 ENCOUNTER — Ambulatory Visit (INDEPENDENT_AMBULATORY_CARE_PROVIDER_SITE_OTHER): Payer: Medicare Other | Admitting: Cardiology

## 2013-04-23 VITALS — BP 120/80 | HR 68 | Wt 193.0 lb

## 2013-04-23 DIAGNOSIS — I4891 Unspecified atrial fibrillation: Secondary | ICD-10-CM

## 2013-04-23 MED ORDER — APIXABAN 5 MG PO TABS: 5.0000 mg | ORAL_TABLET | Freq: Two times a day (BID) | ORAL | Status: DC

## 2013-04-23 MED ORDER — NADOLOL 20 MG PO TABS: ORAL_TABLET | ORAL | Status: DC

## 2013-04-23 NOTE — Assessment & Plan Note (Signed)
Patient was noted to have intermittent paroxysmal atrial fibrillation on his monitor. Discontinue Norvasc. Resume nadalol 10 mg by mouth daily. Embolic risk factors of hypertension and age greater than 38. Discontinue aspirin. Begin apixiban 5 mg by mouth twice a day. Check hemoglobin and renal function in one week. Note his white blood cell count and platelet count were mildly decreased on pre-procedure laboratories. We will recheck this as well. Schedule TSH and echocardiogram.

## 2013-04-23 NOTE — Telephone Encounter (Signed)
New Prob      Pt calling in wanting to speak to nurse. Did not specify what call was regarding.

## 2013-04-23 NOTE — Assessment & Plan Note (Signed)
Discontinue aspirin given need for anticoagulation. Continue statin. Recent nuclear study negative.

## 2013-04-23 NOTE — Patient Instructions (Addendum)
Your physician wants you to follow-up in: 4 MONTHS WITH DR Jens Som You will receive a reminder letter in the mail two months in advance. If you don't receive a letter, please call our office to schedule the follow-up appointment.   STOP ASPIRIN  START ELIQUIS 5 MG ONE TABLET TWICE DAILY  Your physician has requested that you have an echocardiogram. Echocardiography is a painless test that uses sound waves to create images of your heart. It provides your doctor with information about the size and shape of your heart and how well your heart's chambers and valves are working. This procedure takes approximately one hour. There are no restrictions for this procedure.   STOP AMLODIPINE  START NADOLOL 10 MG ONCE DAILY  Your physician recommends that you return for lab work in: 4 WEEKS

## 2013-04-23 NOTE — Progress Notes (Signed)
HPI: Pleasant male for fu of coronary artery disease. The patient had a previous LAD infarct and had PCI of his LAD in April of 1999. Carotid Dopplers in April of 2014 showed 0-39% bilateral stenosis. Followup recommended in 2 years. Last nuclear study in April of 2014 showed no scar or ischemia. There was possible atypical flutter in recovery. Abdominal ultrasound in May 2014 showed no aneurysm. When I last saw himhe was complaining of presyncopal episodes. A monitor showed abrupt onset of 2-1 heart block with associated symptoms. There is also atrial fibrillation associated with palpitations. Patient ultimately had a pacemaker placed. Note his white blood cell count was mildly decreased as was platelets on preprocedure laboratories. Since he was last seen, he occasionally has brief palpitations. No chest pain, dyspnea or syncope.   Current Outpatient Prescriptions  Medication Sig Dispense Refill  . amLODipine (NORVASC) 5 MG tablet Take 5 mg by mouth every morning.      Marland Kitchen aspirin 325 MG tablet Take 325 mg by mouth every morning.       Marland Kitchen atorvastatin (LIPITOR) 10 MG tablet Take 10 mg by mouth every evening.      . Azelaic Acid (FINACEA) 15 % cream Apply 1 application topically daily. After skin is thoroughly washed and patted dry, gently but thoroughly massage a thin film of azelaic acid creame      . Cholecalciferol (VITAMIN D3) 2000 UNITS TABS Take 1 tablet by mouth every evening.       . fluticasone (FLONASE) 50 MCG/ACT nasal spray Place 1 spray into the nose daily as needed for allergies. For seasonal allergies      . loratadine (CLARITIN) 10 MG tablet Take 10 mg by mouth daily as needed for allergies. For seasonal allergies      . Multiple Vitamin (MULTIVITAMIN WITH MINERALS) TABS Take 1 tablet by mouth daily.      . nitroGLYCERIN (NITROSTAT) 0.4 MG SL tablet Place 0.4 mg under the tongue every 5 (five) minutes as needed for chest pain. x3 doses as needed for chest pain      . pyridOXINE  (VITAMIN B-6) 100 MG tablet Take 100 mg by mouth every evening.      . vitamin B-12 (CYANOCOBALAMIN) 1000 MCG tablet Take 1,000 mcg by mouth every evening.       No current facility-administered medications for this visit.     Past Medical History  Diagnosis Date  . CAD (coronary artery disease) prior stenting 1999   . Dyslipidemia   . HTN (hypertension)   . WPW (Wolff-Parkinson-White syndrome) loss of preexcitation   . Atrial fibrillation   . AV block, Mobitz II   . Presyncope   . Myocardial infarction   . Pacemaker 04/07/2013  . Sleep apnea     USES CPAP  . Arthritis     Past Surgical History  Procedure Laterality Date  . Coronary angioplasty with stent placement    . Vastectomy    . Insert / replace / remove pacemaker  04/07/2013    History   Social History  . Marital Status: Married    Spouse Name: N/A    Number of Children: 3  . Years of Education: N/A   Occupational History  .      Retired   Social History Main Topics  . Smoking status: Former Smoker    Quit date: 12/05/1991  . Smokeless tobacco: Never Used  . Alcohol Use: Yes     Comment: Occasional  . Drug Use:  No  . Sexually Active: Not on file   Other Topics Concern  . Not on file   Social History Narrative  . No narrative on file    ROS: no fevers or chills, productive cough, hemoptysis, dysphasia, odynophagia, melena, hematochezia, dysuria, hematuria, rash, seizure activity, orthopnea, PND, pedal edema, claudication. Remaining systems are negative.  Physical Exam: Well-developed well-nourished in no acute distress.  Skin is warm and dry.  HEENT is normal.  Neck is supple.  Chest is clear to auscultation with normal expansion. Pacemaker left chest. Incision without evidence of infection. Cardiovascular exam is regular rate and rhythm.  Abdominal exam nontender or distended. No masses palpated. Extremities show no edema. neuro grossly intact

## 2013-04-23 NOTE — Assessment & Plan Note (Signed)
Continue statin. 

## 2013-04-23 NOTE — Assessment & Plan Note (Signed)
Continue statin. Followup carotid Dopplers April 2016. 

## 2013-04-23 NOTE — Telephone Encounter (Signed)
Spoke with pt, they need me to call [facility] and give the verbal okay for 90 day script for eliquis. Left message on voicemail at [facility].

## 2013-04-23 NOTE — Assessment & Plan Note (Signed)
Patient was noted to have AV block on recent monitor and is now status post pacemaker.

## 2013-04-23 NOTE — Assessment & Plan Note (Signed)
Given palpitations and intermittent atrial fibrillation  I will discontinue his Norvasc and instead treat with a beta blocker.

## 2013-05-20 ENCOUNTER — Other Ambulatory Visit (INDEPENDENT_AMBULATORY_CARE_PROVIDER_SITE_OTHER): Payer: Medicare Other

## 2013-05-20 ENCOUNTER — Ambulatory Visit (HOSPITAL_COMMUNITY): Payer: Medicare Other | Attending: Cardiovascular Disease | Admitting: Radiology

## 2013-05-20 DIAGNOSIS — I456 Pre-excitation syndrome: Secondary | ICD-10-CM | POA: Insufficient documentation

## 2013-05-20 DIAGNOSIS — I2510 Atherosclerotic heart disease of native coronary artery without angina pectoris: Secondary | ICD-10-CM

## 2013-05-20 DIAGNOSIS — I441 Atrioventricular block, second degree: Secondary | ICD-10-CM | POA: Insufficient documentation

## 2013-05-20 DIAGNOSIS — I4891 Unspecified atrial fibrillation: Secondary | ICD-10-CM

## 2013-05-20 LAB — CBC WITH DIFFERENTIAL/PLATELET
Basophils Absolute: 0 10*3/uL (ref 0.0–0.1)
Eosinophils Absolute: 0.1 10*3/uL (ref 0.0–0.7)
Hemoglobin: 13.3 g/dL (ref 13.0–17.0)
Lymphocytes Relative: 31.5 % (ref 12.0–46.0)
MCHC: 34.2 g/dL (ref 30.0–36.0)
Monocytes Relative: 13.5 % — ABNORMAL HIGH (ref 3.0–12.0)
Neutrophils Relative %: 51.5 % (ref 43.0–77.0)
Platelets: 102 10*3/uL — ABNORMAL LOW (ref 150.0–400.0)
RDW: 12.8 % (ref 11.5–14.6)

## 2013-05-20 NOTE — Progress Notes (Signed)
Echocardiogram performed.  

## 2013-05-21 LAB — BASIC METABOLIC PANEL
CO2: 29 mEq/L (ref 19–32)
Chloride: 110 mEq/L (ref 96–112)
Potassium: 4.5 mEq/L (ref 3.5–5.1)
Sodium: 149 mEq/L — ABNORMAL HIGH (ref 135–145)

## 2013-05-21 LAB — TSH: TSH: 1.91 u[IU]/mL (ref 0.35–5.50)

## 2013-05-22 ENCOUNTER — Telehealth: Payer: Self-pay | Admitting: Internal Medicine

## 2013-05-29 NOTE — Telephone Encounter (Signed)
New Problem  Pt is calling regarding you faxing his blood work results over to Dr Katrinka Blazing office.  His fax [phone]  Attn Dr Maggie Schwalbe. He also advice of switching one of his providers into the [facility] system.

## 2013-05-29 NOTE — Telephone Encounter (Signed)
Spoke with pt, aware labs faxed to PCP. His PCP is retiring and he was wondering about seeing a [facility] primary care. Questions answered.

## 2013-05-30 ENCOUNTER — Telehealth: Payer: Self-pay | Admitting: Cardiology

## 2013-05-30 NOTE — Telephone Encounter (Signed)
Spoke with Sherri at [facility] who states she did not receive the lab results that Debra faxed on 6/26.  Sherri states their office has been experiencing some problems with the fax machine.  I refaxed labs to [phone] (original number given) and also to [phone].  I received confirmation from both faxes.  Sherri will call me if there are any further problems.

## 2013-05-30 NOTE — Telephone Encounter (Signed)
New problem   Justin [name]/[facility] is need lab results to be fax to [phone]. Pt stated Dr Jens Som wanted his PCP to look at some lab results. They haven't received a fax of the labs yet.

## 2013-06-03 ENCOUNTER — Encounter: Payer: Self-pay | Admitting: Family Medicine

## 2013-06-03 ENCOUNTER — Ambulatory Visit (INDEPENDENT_AMBULATORY_CARE_PROVIDER_SITE_OTHER): Payer: Medicare Other | Admitting: Family Medicine

## 2013-06-03 VITALS — BP 130/72 | HR 60 | Temp 98.1°F | Ht 71.0 in | Wt 195.0 lb

## 2013-06-03 DIAGNOSIS — Z79899 Other long term (current) drug therapy: Secondary | ICD-10-CM

## 2013-06-03 DIAGNOSIS — R32 Unspecified urinary incontinence: Secondary | ICD-10-CM

## 2013-06-03 DIAGNOSIS — E785 Hyperlipidemia, unspecified: Secondary | ICD-10-CM | POA: Insufficient documentation

## 2013-06-03 DIAGNOSIS — I25810 Atherosclerosis of coronary artery bypass graft(s) without angina pectoris: Secondary | ICD-10-CM

## 2013-06-03 DIAGNOSIS — E870 Hyperosmolality and hypernatremia: Secondary | ICD-10-CM | POA: Insufficient documentation

## 2013-06-03 DIAGNOSIS — D61818 Other pancytopenia: Secondary | ICD-10-CM

## 2013-06-03 DIAGNOSIS — I10 Essential (primary) hypertension: Secondary | ICD-10-CM

## 2013-06-03 DIAGNOSIS — D696 Thrombocytopenia, unspecified: Secondary | ICD-10-CM | POA: Insufficient documentation

## 2013-06-03 DIAGNOSIS — R55 Syncope and collapse: Secondary | ICD-10-CM

## 2013-06-03 DIAGNOSIS — E878 Other disorders of electrolyte and fluid balance, not elsewhere classified: Secondary | ICD-10-CM

## 2013-06-03 DIAGNOSIS — D72819 Decreased white blood cell count, unspecified: Secondary | ICD-10-CM | POA: Insufficient documentation

## 2013-06-03 DIAGNOSIS — N400 Enlarged prostate without lower urinary tract symptoms: Secondary | ICD-10-CM | POA: Insufficient documentation

## 2013-06-03 DIAGNOSIS — I4891 Unspecified atrial fibrillation: Secondary | ICD-10-CM

## 2013-06-03 DIAGNOSIS — G4733 Obstructive sleep apnea (adult) (pediatric): Secondary | ICD-10-CM | POA: Insufficient documentation

## 2013-06-03 DIAGNOSIS — D649 Anemia, unspecified: Secondary | ICD-10-CM | POA: Insufficient documentation

## 2013-06-03 HISTORY — DX: Unspecified urinary incontinence: R32

## 2013-06-03 HISTORY — DX: Hyperosmolality and hypernatremia: E87.0

## 2013-06-03 HISTORY — DX: Obstructive sleep apnea (adult) (pediatric): G47.33

## 2013-06-03 HISTORY — DX: Decreased white blood cell count, unspecified: D72.819

## 2013-06-03 HISTORY — DX: Other pancytopenia: D61.818

## 2013-06-03 HISTORY — DX: Thrombocytopenia, unspecified: D69.6

## 2013-06-03 NOTE — Assessment & Plan Note (Signed)
No further episodes since pacemaker placed.

## 2013-06-03 NOTE — Progress Notes (Signed)
Patient ID: Justin [name], male   DOB: 02/08/1933, 79 y.o.   MRN: [id number] KEONTRE DEFINO [id number] February 21, 1933 06/03/2013      Progress Note New Patient  Subjective  Chief Complaint  Chief Complaint  Patient presents with  . Establish Care    new patient    HPI  79 year old Caucasian male who is in today to establish care. He's recently moved to the area from [place] with his wife to be closer to their daughter. He is residing at [facility]. He had a pacemaker placed 6 weeks ago and is tolerating that well. No complications with placement. He's had no more presyncopal episodes since then but does acknowledge his frustration with persistent fatigue. He had a would've improved more after his pacemaker placement. Otherwise he feels well. He does note some urinary hesitancy in the a.m. and occasional episodes of vertigo, but recently as well. Denies dysuria or hematuria.  No abdominal or back pain. Bowels move relatively well. No chest pain, palpitations, shortness of breath noted today  Past Medical History  Diagnosis Date  . CAD (coronary artery disease) prior stenting 1999   . Dyslipidemia   . HTN (hypertension)   . WPW (Wolff-Parkinson-White syndrome) loss of preexcitation   . Atrial fibrillation   . AV block, Mobitz II   . Presyncope   . Myocardial infarction   . Pacemaker 04/07/2013  . Sleep apnea     USES CPAP  . Arthritis   . History of chicken pox   . Hyperlipidemia   . Anemia   . Hypernatremia 06/03/2013  . Thrombocytopenia, unspecified 06/03/2013  . Leukopenia 06/03/2013  . Obstructive sleep apnea 06/03/2013  . Urinary incontinence 06/03/2013    Past Surgical History  Procedure Laterality Date  . Coronary angioplasty with stent placement    . Vastectomy    . Insert / replace / remove pacemaker  04/07/2013    Family History  Problem Relation Age of Onset  . Stroke Mother   . Hypertension Mother   . Stroke Sister   . Arthritis Sister     rheumatoid  . Heart  attack Sister   . Other Brother     tube put in aorta  . Other Son     cortisone deficiency  . Arthritis Son   . Stroke Brother   . Alcohol abuse Brother   . Barrett's esophagus Son     History   Social History  . Marital Status: Married    Spouse Name: N/A    Number of Children: 3  . Years of Education: N/A   Occupational History  .      Retired   Social History Main Topics  . Smoking status: Former Smoker    Quit date: 12/05/1991  . Smokeless tobacco: Never Used  . Alcohol Use: Yes     Comment: Occasional  . Drug Use: No  . Sexually Active: Not on file   Other Topics Concern  . Not on file   Social History Narrative  . No narrative on file    Current Outpatient Prescriptions on File Prior to Visit  Medication Sig Dispense Refill  . apixaban (ELIQUIS) 5 MG TABS tablet Take 1 tablet (5 mg total) by mouth 2 (two) times daily.  180 tablet  3  . atorvastatin (LIPITOR) 10 MG tablet Take 10 mg by mouth every evening.      . Azelaic Acid (FINACEA) 15 % cream Apply 1 application topically daily. After skin is thoroughly washed  and patted dry, gently but thoroughly massage a thin film of azelaic acid creame      . Cholecalciferol (VITAMIN D3) 2000 UNITS TABS Take 1 tablet by mouth every evening.       . loratadine (CLARITIN) 10 MG tablet Take 10 mg by mouth daily as needed for allergies. For seasonal allergies      . Multiple Vitamin (MULTIVITAMIN WITH MINERALS) TABS Take 1 tablet by mouth daily.      . nadolol (CORGARD) 20 MG tablet TAKE ONE HALF TABLET ONCE DAILY  90 tablet  4  . pyridOXINE (VITAMIN B-6) 100 MG tablet Take 100 mg by mouth every evening.      . vitamin B-12 (CYANOCOBALAMIN) 1000 MCG tablet Take 1,000 mcg by mouth every evening.      . fluticasone (FLONASE) 50 MCG/ACT nasal spray Place 1 spray into the nose daily as needed for allergies. For seasonal allergies      . nitroGLYCERIN (NITROSTAT) 0.4 MG SL tablet Place 0.4 mg under the tongue every 5 (five)  minutes as needed for chest pain. x3 doses as needed for chest pain       No current facility-administered medications on file prior to visit.    No Known Allergies  Review of Systems  Review of Systems  Constitutional: Positive for malaise/fatigue. Negative for fever and chills.  HENT: Negative for hearing loss, nosebleeds and congestion.   Eyes: Negative for pain and discharge.  Respiratory: Negative for cough, sputum production, shortness of breath and wheezing.   Cardiovascular: Negative for chest pain, palpitations and leg swelling.  Gastrointestinal: Negative for heartburn, nausea, vomiting, abdominal pain, diarrhea, constipation and blood in stool.  Genitourinary: Negative for dysuria, urgency, frequency and hematuria.  Musculoskeletal: Negative for myalgias, back pain and falls.  Skin: Negative for rash.  Neurological: Negative for dizziness, tremors, sensory change, focal weakness, loss of consciousness, weakness and headaches.  Endo/Heme/Allergies: Negative for polydipsia. Does not bruise/bleed easily.  Psychiatric/Behavioral: Negative for depression and suicidal ideas. The patient is not nervous/anxious and does not have insomnia.     Objective  BP 130/72  Pulse 60  Temp(Src) 98.1 F (36.7 C) (Oral)  Ht 5\' 11"  (1.803 m)  Wt 195 lb (88.451 kg)  BMI 27.21 kg/m2  SpO2 96%  Physical Exam  Physical Exam  Constitutional: He is oriented to person, place, and time and well-developed, well-nourished, and in no distress. No distress.  HENT:  Head: Normocephalic and atraumatic.  Eyes: Conjunctivae are normal.  Neck: Neck supple. No thyromegaly present.  Cardiovascular: Normal rate and regular rhythm.  Exam reveals no gallop.   Pacemaker palpable in left upper outer chest wall  Pulmonary/Chest: Effort normal and breath sounds normal. No respiratory distress.  Abdominal: He exhibits no distension and no mass. There is no tenderness.  Musculoskeletal: He exhibits no edema.   Neurological: He is alert and oriented to person, place, and time.  Skin: Skin is warm.  Psychiatric: Memory, affect and judgment normal.       Assessment & Plan  Near syncope No further episodes since pacemaker placed.   Atrial fibrillation Tolerating Eliquis. Follows with cardiology  HTN (hypertension) Well controlled, no changes today  Hyperlipidemia Avoid trans fats, tolerating Atorvastatin  Urinary incontinence Check PSA and UA, referred to urology for further work up has episodes of sudden urge incontinence  Hypernatremia New, repeat renal panel today, minimize sodium in diet  Anemia Very mild and new repeat cbc today  Thrombocytopenia, unspecified Persistent, repeat cbc today,  may need referral to hematology for further consideration  Obstructive sleep apnea Discussed possible referral to pulmonology for sleep study in future, will continue daily use of CPAP now

## 2013-06-03 NOTE — Assessment & Plan Note (Signed)
Avoid trans fats, tolerating Atorvastatin

## 2013-06-03 NOTE — Assessment & Plan Note (Signed)
Check PSA and UA, referred to urology for further work up has episodes of sudden urge incontinence

## 2013-06-03 NOTE — Assessment & Plan Note (Addendum)
New, repeat renal panel today, minimize sodium in diet

## 2013-06-03 NOTE — Assessment & Plan Note (Signed)
Discussed possible referral to pulmonology for sleep study in future, will continue daily use of CPAP now

## 2013-06-03 NOTE — Assessment & Plan Note (Signed)
Persistent, repeat cbc today, may need referral to hematology for further consideration

## 2013-06-03 NOTE — Assessment & Plan Note (Signed)
Very mild and new repeat cbc today

## 2013-06-03 NOTE — Assessment & Plan Note (Signed)
Tolerating Eliquis. Follows with cardiology

## 2013-06-03 NOTE — Patient Instructions (Addendum)
Rel of rec pulmonologist in [place] for sleep study and CPAP settings Dr Wilson Singer pmd in [facility] for Adults, Male A healthy lifestyle and preventive care can promote health and wellness. Preventive health guidelines for men include the following key practices:  A routine yearly physical is a good way to check with your caregiver about your health and preventative screening. It is a chance to share any concerns and updates on your health, and to receive a thorough exam.  Visit your dentist for a routine exam and preventative care every 6 months. Brush your teeth twice a day and floss once a day. Good oral hygiene prevents tooth decay and gum disease.  The frequency of eye exams is based on your age, health, family medical history, use of contact lenses, and other factors. Follow your caregiver's recommendations for frequency of eye exams.  Eat a healthy diet. Foods like vegetables, fruits, whole grains, low-fat dairy products, and lean protein foods contain the nutrients you need without too many calories. Decrease your intake of foods high in solid fats, added sugars, and salt. Eat the right amount of calories for you.Get information about a proper diet from your caregiver, if necessary.  Regular physical exercise is one of the most important things you can do for your health. Most adults should get at least 150 minutes of moderate-intensity exercise (any activity that increases your heart rate and causes you to sweat) each week. In addition, most adults need muscle-strengthening exercises on 2 or more days a week.  Maintain a healthy weight. The body mass index (BMI) is a screening tool to identify possible weight problems. It provides an estimate of body fat based on height and weight. Your caregiver can help determine your BMI, and can help you achieve or maintain a healthy weight.For adults 20 years and older:  A BMI below 18.5 is considered underweight.  A BMI of 18.5  to 24.9 is normal.  A BMI of 25 to 29.9 is considered overweight.  A BMI of 30 and above is considered obese.  Maintain normal blood lipids and cholesterol levels by exercising and minimizing your intake of saturated fat. Eat a balanced diet with plenty of fruit and vegetables. Blood tests for lipids and cholesterol should begin at age 70 and be repeated every 5 years. If your lipid or cholesterol levels are high, you are over 50, or you are a high risk for heart disease, you may need your cholesterol levels checked more frequently.Ongoing high lipid and cholesterol levels should be treated with medicines if diet and exercise are not effective.  If you smoke, find out from your caregiver how to quit. If you do not use tobacco, do not start.  If you choose to drink alcohol, do not exceed 2 drinks per day. One drink is considered to be 12 ounces (355 mL) of beer, 5 ounces (148 mL) of wine, or 1.5 ounces (44 mL) of liquor.  Avoid use of street drugs. Do not share needles with anyone. Ask for help if you need support or instructions about stopping the use of drugs.  High blood pressure causes heart disease and increases the risk of stroke. Your blood pressure should be checked at least every 1 to 2 years. Ongoing high blood pressure should be treated with medicines, if weight loss and exercise are not effective.  If you are 73 to 77 years old, ask your caregiver if you should take aspirin to prevent heart disease.  Diabetes screening involves  taking a blood sample to check your fasting blood sugar level. This should be done once every 3 years, after age 14, if you are within normal weight and without risk factors for diabetes. Testing should be considered at a younger age or be carried out more frequently if you are overweight and have at least 1 risk factor for diabetes.  Colorectal cancer can be detected and often prevented. Most routine colorectal cancer screening begins at the age of 47 and  continues through age 56. However, your caregiver may recommend screening at an earlier age if you have risk factors for colon cancer. On a yearly basis, your caregiver may provide home test kits to check for hidden blood in the stool. Use of a small camera at the end of a tube, to directly examine the colon (sigmoidoscopy or colonoscopy), can detect the earliest forms of colorectal cancer. Talk to your caregiver about this at age 69, when routine screening begins. Direct examination of the colon should be repeated every 5 to 10 years through age 77, unless early forms of pre-cancerous polyps or small growths are found.  Hepatitis C blood testing is recommended for all people born from [birth year] through 1965 and any individual with known risks for hepatitis C.  Practice safe sex. Use condoms and avoid high-risk sexual practices to reduce the spread of sexually transmitted infections (STIs). STIs include gonorrhea, chlamydia, syphilis, trichomonas, herpes, HPV, and human immunodeficiency virus (HIV). Herpes, HIV, and HPV are viral illnesses that have no cure. They can result in disability, cancer, and death.  A one-time screening for abdominal aortic aneurysm (AAA) and surgical repair of large AAAs by sound wave imaging (ultrasonography) is recommended for ages 4 to 89 years who are current or former smokers.  Healthy men should no longer receive prostate-specific antigen (PSA) blood tests as part of routine cancer screening. Consult with your caregiver about prostate cancer screening.  Testicular cancer screening is not recommended for adult males who have no symptoms. Screening includes self-exam, caregiver exam, and other screening tests. Consult with your caregiver about any symptoms you have or any concerns you have about testicular cancer.  Use sunscreen with skin protection factor (SPF) of 30 or more. Apply sunscreen liberally and repeatedly throughout the day. You should seek shade when your shadow  is shorter than you. Protect yourself by wearing long sleeves, pants, a wide-brimmed hat, and sunglasses year round, whenever you are outdoors.  Once a month, do a whole body skin exam, using a mirror to look at the skin on your back. Notify your caregiver of new moles, moles that have irregular borders, moles that are larger than a pencil eraser, or moles that have changed in shape or color.  Stay current with required immunizations.  Influenza. You need a dose every fall (or winter). The composition of the flu vaccine changes each year, so being vaccinated once is not enough.  Pneumococcal polysaccharide. You need 1 to 2 doses if you smoke cigarettes or if you have certain chronic medical conditions. You need 1 dose at age 66 (or older) if you have never been vaccinated.  Tetanus, diphtheria, pertussis (Tdap, Td). Get 1 dose of Tdap vaccine if you are younger than age 76 years, are over 44 and have contact with an infant, are a Research scientist (physical sciences), or simply want to be protected from whooping cough. After that, you need a Td booster dose every 10 years. Consult your caregiver if you have not had at least 3 tetanus and  diphtheria-containing shots sometime in your life or have a deep or dirty wound.  HPV. This vaccine is recommended for males 13 through 77 years of age. This vaccine may be given to men 22 through 77 years of age who have not completed the 3 dose series. It is recommended for men through age 27 who have sex with men or whose immune system is weakened because of HIV infection, other illness, or medications. The vaccine is given in 3 doses over 6 months.  Measles, mumps, rubella (MMR). You need at least 1 dose of MMR if you were born in 1957 or later. You may also need a 2nd dose.  Meningococcal. If you are age 32 to 73 years and a Orthoptist living in a residence hall, or have one of several medical conditions, you need to get vaccinated against meningococcal disease. You  may also need additional booster doses.  Zoster (shingles). If you are age 74 years or older, you should get this vaccine.  Varicella (chickenpox). If you have never had chickenpox or you were vaccinated but received only 1 dose, talk to your caregiver to find out if you need this vaccine.  Hepatitis A. You need this vaccine if you have a specific risk factor for hepatitis A virus infection, or you simply wish to be protected from this disease. The vaccine is usually given as 2 doses, 6 to 18 months apart.  Hepatitis B. You need this vaccine if you have a specific risk factor for hepatitis B virus infection or you simply wish to be protected from this disease. The vaccine is given in 3 doses, usually over 6 months. Preventative Service / Frequency Ages 89 to 34  Blood pressure check.** / Every 1 to 2 years.  Lipid and cholesterol check.** / Every 5 years beginning at age 27.  Hepatitis C blood test.** / For any individual with known risks for hepatitis C.  Skin self-exam. / Monthly.  Influenza immunization.** / Every year.  Pneumococcal polysaccharide immunization.** / 1 to 2 doses if you smoke cigarettes or if you have certain chronic medical conditions.  Tetanus, diphtheria, pertussis (Tdap,Td) immunization. / A one-time dose of Tdap vaccine. After that, you need a Td booster dose every 10 years.  HPV immunization. / 3 doses over 6 months, if 26 and younger.  Measles, mumps, rubella (MMR) immunization. / You need at least 1 dose of MMR if you were born in 1957 or later. You may also need a 2nd dose.  Meningococcal immunization. / 1 dose if you are age 42 to 33 years and a Orthoptist living in a residence hall, or have one of several medical conditions, you need to get vaccinated against meningococcal disease. You may also need additional booster doses.  Varicella immunization.** / Consult your caregiver.  Hepatitis A immunization.** / Consult your caregiver. 2  doses, 6 to 18 months apart.  Hepatitis B immunization.** / Consult your caregiver. 3 doses usually over 6 months. Ages 33 to 46  Blood pressure check.** / Every 1 to 2 years.  Lipid and cholesterol check.** / Every 5 years beginning at age 67.  Fecal occult blood test (FOBT) of stool. / Every year beginning at age 37 and continuing until age 41. You may not have to do this test if you get colonoscopy every 10 years.  Flexible sigmoidoscopy** or colonoscopy.** / Every 5 years for a flexible sigmoidoscopy or every 10 years for a colonoscopy beginning at age 36 and continuing  until age 59.  Hepatitis C blood test.** / For all people born from [birth year] through 1965 and any individual with known risks for hepatitis C.  Skin self-exam. / Monthly.  Influenza immunization.** / Every year.  Pneumococcal polysaccharide immunization.** / 1 to 2 doses if you smoke cigarettes or if you have certain chronic medical conditions.  Tetanus, diphtheria, pertussis (Tdap/Td) immunization.** / A one-time dose of Tdap vaccine. After that, you need a Td booster dose every 10 years.  Measles, mumps, rubella (MMR) immunization. / You need at least 1 dose of MMR if you were born in 1957 or later. You may also need a 2nd dose.  Varicella immunization.**/ Consult your caregiver.  Meningococcal immunization.** / Consult your caregiver.  Hepatitis A immunization.** / Consult your caregiver. 2 doses, 6 to 18 months apart.  Hepatitis B immunization.** / Consult your caregiver. 3 doses, usually over 6 months. Ages 52 and over  Blood pressure check.** / Every 1 to 2 years.  Lipid and cholesterol check.**/ Every 5 years beginning at age 59.  Fecal occult blood test (FOBT) of stool. / Every year beginning at age 62 and continuing until age 43. You may not have to do this test if you get colonoscopy every 10 years.  Flexible sigmoidoscopy** or colonoscopy.** / Every 5 years for a flexible sigmoidoscopy or every 10  years for a colonoscopy beginning at age 91 and continuing until age 42.  Hepatitis C blood test.** / For all people born from [birth year] through 1965 and any individual with known risks for hepatitis C.  Abdominal aortic aneurysm (AAA) screening.** / A one-time screening for ages 74 to 70 years who are current or former smokers.  Skin self-exam. / Monthly.  Influenza immunization.** / Every year.  Pneumococcal polysaccharide immunization.** / 1 dose at age 85 (or older) if you have never been vaccinated.  Tetanus, diphtheria, pertussis (Tdap, Td) immunization. / A one-time dose of Tdap vaccine if you are over 65 and have contact with an infant, are a Research scientist (physical sciences), or simply want to be protected from whooping cough. After that, you need a Td booster dose every 10 years.  Varicella immunization. ** / Consult your caregiver.  Meningococcal immunization.** / Consult your caregiver.  Hepatitis A immunization. ** / Consult your caregiver. 2 doses, 6 to 18 months apart.  Hepatitis B immunization.** / Check with your caregiver. 3 doses, usually over 6 months. **Family history and personal history of risk and conditions may change your caregiver's recommendations. Document Released: 01/16/2002 Document Revised: 02/12/2012 Document Reviewed: 04/17/2011 [facility] Patient Information 2014 [place], [place].

## 2013-06-03 NOTE — Assessment & Plan Note (Signed)
Well controlled, no changes today 

## 2013-06-04 LAB — HEPATIC FUNCTION PANEL
Albumin: 3.9 g/dL (ref 3.5–5.2)
Total Protein: 6.9 g/dL (ref 6.0–8.3)

## 2013-06-04 LAB — URINALYSIS
Bilirubin Urine: NEGATIVE
Ketones, ur: NEGATIVE mg/dL
Protein, ur: NEGATIVE mg/dL
Urobilinogen, UA: 0.2 mg/dL (ref 0.0–1.0)

## 2013-06-04 LAB — RENAL FUNCTION PANEL
BUN: 24 mg/dL — ABNORMAL HIGH (ref 6–23)
Calcium: 9.1 mg/dL (ref 8.4–10.5)
Creat: 1.21 mg/dL (ref 0.50–1.35)
Phosphorus: 3.5 mg/dL (ref 2.3–4.6)

## 2013-06-04 LAB — RETICULOCYTES
RBC.: 4.07 MIL/uL — ABNORMAL LOW (ref 4.22–5.81)
Retic Ct Pct: 0.7 % (ref 0.4–2.3)

## 2013-06-04 LAB — PSA: PSA: 2.81 ng/mL (ref <=4.00)

## 2013-06-05 LAB — URINE CULTURE: Organism ID, Bacteria: NO GROWTH

## 2013-06-13 ENCOUNTER — Encounter: Payer: Self-pay | Admitting: Cardiology

## 2013-07-09 ENCOUNTER — Other Ambulatory Visit: Payer: Self-pay

## 2013-07-14 ENCOUNTER — Encounter: Payer: Medicare Other | Admitting: Internal Medicine

## 2013-07-21 ENCOUNTER — Other Ambulatory Visit: Payer: Self-pay | Admitting: Family Medicine

## 2013-07-21 ENCOUNTER — Ambulatory Visit (INDEPENDENT_AMBULATORY_CARE_PROVIDER_SITE_OTHER): Payer: Medicare Other | Admitting: Family Medicine

## 2013-07-21 ENCOUNTER — Encounter: Payer: Self-pay | Admitting: Family Medicine

## 2013-07-21 VITALS — BP 118/64 | HR 60 | Temp 98.0°F | Ht 71.0 in | Wt 193.0 lb

## 2013-07-21 DIAGNOSIS — D649 Anemia, unspecified: Secondary | ICD-10-CM

## 2013-07-21 DIAGNOSIS — N400 Enlarged prostate without lower urinary tract symptoms: Secondary | ICD-10-CM

## 2013-07-21 DIAGNOSIS — D72819 Decreased white blood cell count, unspecified: Secondary | ICD-10-CM

## 2013-07-21 DIAGNOSIS — R945 Abnormal results of liver function studies: Secondary | ICD-10-CM

## 2013-07-21 DIAGNOSIS — E785 Hyperlipidemia, unspecified: Secondary | ICD-10-CM

## 2013-07-21 DIAGNOSIS — I10 Essential (primary) hypertension: Secondary | ICD-10-CM

## 2013-07-21 DIAGNOSIS — K769 Liver disease, unspecified: Secondary | ICD-10-CM

## 2013-07-21 LAB — HEPATIC FUNCTION PANEL
ALT: 35 U/L (ref 0–53)
AST: 47 U/L — ABNORMAL HIGH (ref 0–37)
Albumin: 4.2 g/dL (ref 3.5–5.2)
Alkaline Phosphatase: 54 U/L (ref 39–117)
Bilirubin, Direct: 0.2 mg/dL (ref 0.0–0.3)
Indirect Bilirubin: 0.4 mg/dL (ref 0.0–0.9)
Total Bilirubin: 0.6 mg/dL (ref 0.3–1.2)
Total Protein: 6.8 g/dL (ref 6.0–8.3)

## 2013-07-21 NOTE — Patient Instructions (Addendum)

## 2013-07-21 NOTE — Assessment & Plan Note (Addendum)
Responding well to Flomax, seen by Urology

## 2013-07-22 ENCOUNTER — Ambulatory Visit (INDEPENDENT_AMBULATORY_CARE_PROVIDER_SITE_OTHER): Payer: Medicare Other | Admitting: Internal Medicine

## 2013-07-22 ENCOUNTER — Encounter: Payer: Self-pay | Admitting: Internal Medicine

## 2013-07-22 VITALS — BP 144/69 | HR 60 | Ht 71.0 in | Wt 193.0 lb

## 2013-07-22 DIAGNOSIS — I10 Essential (primary) hypertension: Secondary | ICD-10-CM

## 2013-07-22 DIAGNOSIS — R55 Syncope and collapse: Secondary | ICD-10-CM

## 2013-07-22 DIAGNOSIS — I495 Sick sinus syndrome: Secondary | ICD-10-CM

## 2013-07-22 DIAGNOSIS — Z950 Presence of cardiac pacemaker: Secondary | ICD-10-CM

## 2013-07-22 DIAGNOSIS — I441 Atrioventricular block, second degree: Secondary | ICD-10-CM

## 2013-07-22 DIAGNOSIS — I498 Other specified cardiac arrhythmias: Secondary | ICD-10-CM

## 2013-07-22 DIAGNOSIS — I4891 Unspecified atrial fibrillation: Secondary | ICD-10-CM

## 2013-07-22 LAB — PACEMAKER DEVICE OBSERVATION
AL IMPEDENCE PM: 621 Ohm
AL THRESHOLD: 0.5 V
ATRIAL PACING PM: 55
BATTERY VOLTAGE: 2.8 V
RV LEAD IMPEDENCE PM: 699 Ohm
VENTRICULAR PACING PM: 100

## 2013-07-22 LAB — CBC
Hemoglobin: 13.2 g/dL (ref 13.0–17.0)
MCH: 32.4 pg (ref 26.0–34.0)
MCHC: 34.1 g/dL (ref 30.0–36.0)
MCV: 94.9 fL (ref 78.0–100.0)

## 2013-07-22 NOTE — Assessment & Plan Note (Signed)
No recurrent events following pacemaker insertion

## 2013-07-22 NOTE — Assessment & Plan Note (Signed)
Reasonably controlled 

## 2013-07-22 NOTE — Assessment & Plan Note (Signed)
Infrequent episodes of atrial fibrillation. On apixaban

## 2013-07-22 NOTE — Progress Notes (Signed)
Patient Care Team: Bradd Canary, MD as PCP - General (Family Medicine) Lewayne Bunting, MD as Attending Physician (Cardiology)   HPI  Justin [name] is a 79 y.o. male Seen in followup for pacemaker implantation 4 atrial fibrillation presyncope and documented 2: 1 heart block. He underwent pacemaker implantation 5/14.  He has had no recurrent presyncope. He has noted some exercise intolerance and noted that his heart rate with peak at 65-70 following exertion    He has a history of coronary artery disease with prior LAD infarct and PCI April 1999. Stress echo 2011 demonstrated no ischemia and normal left ventricular function  Past Medical History  Diagnosis Date  . CAD (coronary artery disease) prior stenting 1999   . Dyslipidemia   . HTN (hypertension)   . WPW (Wolff-Parkinson-White syndrome) loss of preexcitation   . Atrial fibrillation   . AV block, Mobitz II   . Presyncope   . Myocardial infarction   . Pacemaker 04/07/2013  . Sleep apnea     USES CPAP  . Arthritis   . History of chicken pox   . Hyperlipidemia   . Anemia   . Hypernatremia 06/03/2013  . Thrombocytopenia, unspecified 06/03/2013  . Leukopenia 06/03/2013  . Obstructive sleep apnea 06/03/2013  . Urinary incontinence 06/03/2013    Past Surgical History  Procedure Laterality Date  . Coronary angioplasty with stent placement    . Vastectomy    . Insert / replace / remove pacemaker  04/07/2013    Current Outpatient Prescriptions  Medication Sig Dispense Refill  . apixaban (ELIQUIS) 5 MG TABS tablet Take 1 tablet (5 mg total) by mouth 2 (two) times daily.  180 tablet  3  . atorvastatin (LIPITOR) 10 MG tablet Take 10 mg by mouth every evening.      . Azelaic Acid (FINACEA) 15 % cream Apply 1 application topically daily. After skin is thoroughly washed and patted dry, gently but thoroughly massage a thin film of azelaic acid creame      . Cholecalciferol (VITAMIN D-3) 1000 UNITS CAPS Take 1 capsule by mouth daily.       . fluticasone (FLONASE) 50 MCG/ACT nasal spray Place 1 spray into the nose daily as needed for allergies. For seasonal allergies      . loratadine (CLARITIN) 10 MG tablet Take 10 mg by mouth daily as needed for allergies. For seasonal allergies      . Multiple Vitamin (MULTIVITAMIN WITH MINERALS) TABS Take 1 tablet by mouth 2 (two) times a week.       . nadolol (CORGARD) 20 MG tablet TAKE ONE HALF TABLET ONCE DAILY  90 tablet  4  . nitroGLYCERIN (NITROSTAT) 0.4 MG SL tablet Place 0.4 mg under the tongue every 5 (five) minutes as needed for chest pain. x3 doses as needed for chest pain      . pyridOXINE (VITAMIN B-6) 100 MG tablet Take 100 mg by mouth every evening.      . tamsulosin (FLOMAX) 0.4 MG CAPS capsule Take 0.4 mg by mouth at bedtime.      . vitamin B-12 (CYANOCOBALAMIN) 1000 MCG tablet Take 1,000 mcg by mouth every evening.       No current facility-administered medications for this visit.    No Known Allergies  Review of Systems negative except from HPI and PMH  Physical Exam BP 144/69  Pulse 60  Ht 5\' 11"  (1.803 m)  Wt 193 lb (87.544 kg)  BMI 26.93 kg/m2 Well developed and well  nourished in no acute distress HENT normal E scleral and icterus clear Neck Supple JVP flat; carotids brisk and full Clear to ausculation Device pocket well healed; without hematoma or erythema.  There is no tethering Regular rate and rhythm, no murmurs gallops or rub Soft with active bowel sounds No clubbing cyanosis none Edema Alert and oriented, grossly normal motor and sensory function Skin Warm and Dry  ECG demonstrates P. Synchronous pacing  Assessment and  Plan \

## 2013-07-22 NOTE — Assessment & Plan Note (Signed)
The patient's device was interrogated and the information was fully reviewed.  The device was reprogrammed to maximize longevity and  We heactivated rate response

## 2013-07-22 NOTE — Assessment & Plan Note (Signed)
Results post pacing

## 2013-07-22 NOTE — Assessment & Plan Note (Signed)
Heart rate excursion seems to be blunted. Activated rate response. He will let us know how next empiric reprogramming helps with his exercise.

## 2013-07-22 NOTE — Patient Instructions (Signed)
Your physician wants you to follow-up in: May 2015 with Rick Duff, PA for Dr. Graciela Husbands. You will receive a reminder letter in the mail two months in advance. If you don't receive a letter, please call our office to schedule the follow-up appointment.  Your physician recommends that you continue on your current medications as directed. Please refer to the Current Medication list given to you today.

## 2013-07-27 NOTE — Assessment & Plan Note (Signed)
Tolerating Lipitor, avoid trans fats, increase activity as tolerated.

## 2013-07-27 NOTE — Progress Notes (Signed)
Patient ID: Justin [name], male   DOB: Apr 25, 1933, 79 y.o.   MRN: [id number] Justin [name] [id number] 1933/06/11 07/27/2013      Progress Note-Follow Up  Subjective  Chief Complaint  Chief Complaint  Patient presents with  . Follow-up    6 week    HPI  Patient is a 79 year old Caucasian male who is in today for followup. Feeling well. Taking medications as prescribed. Denies any recent illness. No chest pain, palpitations, shortness of breath, GI or GU concerns noted. Has been seen by urology and was started on Flomax and he is noting improvement and getting up less at night.  Past Medical History  Diagnosis Date  . CAD (coronary artery disease) prior stenting 1999   . Dyslipidemia   . HTN (hypertension)   . WPW (Wolff-Parkinson-White syndrome) loss of preexcitation   . Atrial fibrillation   . AV block, Mobitz II   . Presyncope   . Myocardial infarction   . Pacemaker 04/07/2013  . Sleep apnea     USES CPAP  . Arthritis   . History of chicken pox   . Hyperlipidemia   . Anemia   . Hypernatremia 06/03/2013  . Thrombocytopenia, unspecified 06/03/2013  . Leukopenia 06/03/2013  . Obstructive sleep apnea 06/03/2013  . Urinary incontinence 06/03/2013    Past Surgical History  Procedure Laterality Date  . Coronary angioplasty with stent placement    . Vastectomy    . Insert / replace / remove pacemaker  04/07/2013    Family History  Problem Relation Age of Onset  . Stroke Mother   . Hypertension Mother   . Stroke Sister   . Arthritis Sister     rheumatoid  . Heart attack Sister   . Anemia Sister   . Other Brother     tube put in aorta  . Anemia Brother   . Other Son     cortisone deficiency  . Arthritis Son   . Stroke Brother   . Alcohol abuse Brother   . Barrett's esophagus Son     History   Social History  . Marital Status: Married    Spouse Name: N/A    Number of Children: 3  . Years of Education: N/A   Occupational History  .      Retired   Social  History Main Topics  . Smoking status: Former Smoker    Quit date: 12/05/1991  . Smokeless tobacco: Never Used  . Alcohol Use: Yes     Comment: Occasional  . Drug Use: No  . Sexual Activity: Not on file   Other Topics Concern  . Not on file   Social History Narrative  . No narrative on file    Current Outpatient Prescriptions on File Prior to Visit  Medication Sig Dispense Refill  . apixaban (ELIQUIS) 5 MG TABS tablet Take 1 tablet (5 mg total) by mouth 2 (two) times daily.  180 tablet  3  . atorvastatin (LIPITOR) 10 MG tablet Take 10 mg by mouth every evening.      . Azelaic Acid (FINACEA) 15 % cream Apply 1 application topically daily. After skin is thoroughly washed and patted dry, gently but thoroughly massage a thin film of azelaic acid creame      . fluticasone (FLONASE) 50 MCG/ACT nasal spray Place 1 spray into the nose daily as needed for allergies. For seasonal allergies      . loratadine (CLARITIN) 10 MG tablet Take 10 mg by mouth daily as  needed for allergies. For seasonal allergies      . Multiple Vitamin (MULTIVITAMIN WITH MINERALS) TABS Take 1 tablet by mouth 2 (two) times a week.       . nadolol (CORGARD) 20 MG tablet TAKE ONE HALF TABLET ONCE DAILY  90 tablet  4  . nitroGLYCERIN (NITROSTAT) 0.4 MG SL tablet Place 0.4 mg under the tongue every 5 (five) minutes as needed for chest pain. x3 doses as needed for chest pain      . pyridOXINE (VITAMIN B-6) 100 MG tablet Take 100 mg by mouth every evening.      . vitamin B-12 (CYANOCOBALAMIN) 1000 MCG tablet Take 1,000 mcg by mouth every evening.       No current facility-administered medications on file prior to visit.    No Known Allergies  Review of Systems  Review of Systems  Constitutional: Negative for fever and malaise/fatigue.  HENT: Negative for congestion.   Eyes: Negative for discharge.  Respiratory: Negative for shortness of breath.   Cardiovascular: Negative for chest pain, palpitations and leg swelling.   Gastrointestinal: Negative for nausea, abdominal pain and diarrhea.  Genitourinary: Negative for dysuria.  Musculoskeletal: Negative for falls.  Skin: Negative for rash.  Neurological: Negative for loss of consciousness and headaches.  Endo/Heme/Allergies: Negative for polydipsia.  Psychiatric/Behavioral: Negative for depression and suicidal ideas. The patient is not nervous/anxious and does not have insomnia.     Objective  BP 118/64  Pulse 60  Temp(Src) 98 F (36.7 C) (Oral)  Ht 5\' 11"  (1.803 m)  Wt 193 lb (87.544 kg)  BMI 26.93 kg/m2  SpO2 97%  Physical Exam  Physical Exam  Constitutional: He is oriented to person, place, and time and well-developed, well-nourished, and in no distress. No distress.  HENT:  Head: Normocephalic and atraumatic.  Eyes: Conjunctivae are normal.  Neck: Neck supple. No thyromegaly present.  Cardiovascular: Normal rate and normal heart sounds.   No murmur heard. Pulmonary/Chest: Effort normal and breath sounds normal. No respiratory distress.  Abdominal: He exhibits no distension and no mass. There is no tenderness.  Musculoskeletal: He exhibits no edema.  Neurological: He is alert and oriented to person, place, and time.  Skin: Skin is warm.  Psychiatric: Memory, affect and judgment normal.    Lab Results  Component Value Date   TSH 1.91 05/20/2013   Lab Results  Component Value Date   WBC 5.1 07/21/2013   HGB 13.2 07/21/2013   HCT 38.7* 07/21/2013   MCV 94.9 07/21/2013   PLT 132* 07/21/2013   Lab Results  Component Value Date   CREATININE 1.21 06/03/2013   BUN 24* 06/03/2013   NA 142 06/03/2013   K 4.6 06/03/2013   CL 106 06/03/2013   CO2 29 06/03/2013   Lab Results  Component Value Date   ALT 35 07/21/2013   AST 47* 07/21/2013   ALKPHOS 54 07/21/2013   BILITOT 0.6 07/21/2013     Assessment & Plan  BPH (benign prostatic hyperplasia) Responding well to Flomax, seen by Urology  HTN (hypertension) Well controlled, no  changes  Anemia Very mild, increase leafy greens and red meat.   Hyperlipidemia Tolerating Lipitor, avoid trans fats, increase activity as tolerated.

## 2013-07-27 NOTE — Assessment & Plan Note (Signed)
Very mild, increase leafy greens and red meat.

## 2013-07-27 NOTE — Assessment & Plan Note (Signed)
Well controlled, no changes 

## 2013-08-13 ENCOUNTER — Encounter: Payer: Self-pay | Admitting: Cardiology

## 2013-08-13 ENCOUNTER — Ambulatory Visit (INDEPENDENT_AMBULATORY_CARE_PROVIDER_SITE_OTHER): Payer: Medicare Other | Admitting: Cardiology

## 2013-08-13 VITALS — BP 130/80 | HR 62 | Wt 183.0 lb

## 2013-08-13 DIAGNOSIS — I10 Essential (primary) hypertension: Secondary | ICD-10-CM

## 2013-08-13 DIAGNOSIS — E785 Hyperlipidemia, unspecified: Secondary | ICD-10-CM

## 2013-08-13 DIAGNOSIS — I059 Rheumatic mitral valve disease, unspecified: Secondary | ICD-10-CM

## 2013-08-13 DIAGNOSIS — I2510 Atherosclerotic heart disease of native coronary artery without angina pectoris: Secondary | ICD-10-CM

## 2013-08-13 DIAGNOSIS — Z950 Presence of cardiac pacemaker: Secondary | ICD-10-CM

## 2013-08-13 DIAGNOSIS — I679 Cerebrovascular disease, unspecified: Secondary | ICD-10-CM

## 2013-08-13 DIAGNOSIS — I340 Nonrheumatic mitral (valve) insufficiency: Secondary | ICD-10-CM | POA: Insufficient documentation

## 2013-08-13 DIAGNOSIS — I4891 Unspecified atrial fibrillation: Secondary | ICD-10-CM

## 2013-08-13 NOTE — Assessment & Plan Note (Signed)
Continue statin. Followup carotid Dopplers April 2016. 

## 2013-08-13 NOTE — Patient Instructions (Addendum)
Your physician recommends that you continue on your current medications as directed. Please refer to the Current Medication list given to you today.  Your physician wants you to follow-up in: 6 months with Justin [name]. You will receive a reminder letter in the mail two months in advance. If you don't receive a letter, please call our office to schedule the follow-up appointment.

## 2013-08-13 NOTE — Assessment & Plan Note (Signed)
Management per electrophysiology. 

## 2013-08-13 NOTE — Assessment & Plan Note (Addendum)
Continue statin. Not on aspirin given need for anticoagulation. 

## 2013-08-13 NOTE — Assessment & Plan Note (Signed)
Plan follow-up echocardiogram in the future. 

## 2013-08-13 NOTE — Assessment & Plan Note (Signed)
Plan continue beta blocker as well as apixaban.

## 2013-08-13 NOTE — Assessment & Plan Note (Signed)
Continue present blood pressure medications. 

## 2013-08-13 NOTE — Progress Notes (Signed)
HPI: Pleasant male for fu of coronary artery disease and atrial fibrillation. The patient had a previous LAD infarct and had PCI of his LAD in April of 1999. Carotid Dopplers in April of 2014 showed 0-39% bilateral stenosis. Followup recommended in 2 years. Last nuclear study in April of 2014 showed no scar or ischemia. There was possible atypical flutter in recovery. Abdominal ultrasound in May 2014 showed no aneurysm. Previous monitor showed symptomatic 2:1 AV block and paroxysmal atrial fibrillation. Patient had a pacemaker placed. Echocardiogram in June of 2014 showed an ejection fraction of 50-55%, moderate mitral regurgitation, trace aortic insufficiency and mild left atrial enlargement. Patient had CBC in June of 2014 that showed mild neutropenia, macrocytosis and hypernatremia. He was asked to followup with primary care. Since I last saw him the patient denies any dyspnea on exertion, orthopnea, PND, pedal edema, palpitations, syncope or chest pain.    Current Outpatient Prescriptions  Medication Sig Dispense Refill  . apixaban (ELIQUIS) 5 MG TABS tablet Take 1 tablet (5 mg total) by mouth 2 (two) times daily.  180 tablet  3  . atorvastatin (LIPITOR) 10 MG tablet Take 10 mg by mouth every evening.      . Azelaic Acid (FINACEA) 15 % cream Apply 1 application topically daily. After skin is thoroughly washed and patted dry, gently but thoroughly massage a thin film of azelaic acid creame      . Cholecalciferol (VITAMIN D-3) 1000 UNITS CAPS Take 1 capsule by mouth daily.      . fluticasone (FLONASE) 50 MCG/ACT nasal spray Place 1 spray into the nose daily as needed for allergies. For seasonal allergies      . loratadine (CLARITIN) 10 MG tablet Take 10 mg by mouth daily as needed for allergies. For seasonal allergies      . Multiple Vitamin (MULTIVITAMIN WITH MINERALS) TABS Take 1 tablet by mouth 2 (two) times a week.       . nadolol (CORGARD) 20 MG tablet TAKE ONE HALF TABLET ONCE DAILY  90  tablet  4  . nitroGLYCERIN (NITROSTAT) 0.4 MG SL tablet Place 0.4 mg under the tongue every 5 (five) minutes as needed for chest pain. x3 doses as needed for chest pain      . pyridOXINE (VITAMIN B-6) 100 MG tablet Take 100 mg by mouth every evening.      . tamsulosin (FLOMAX) 0.4 MG CAPS capsule Take 0.4 mg by mouth at bedtime.      . vitamin B-12 (CYANOCOBALAMIN) 1000 MCG tablet Take 1,000 mcg by mouth every evening.       No current facility-administered medications for this visit.     Past Medical History  Diagnosis Date  . CAD (coronary artery disease) prior stenting 1999   . Dyslipidemia   . HTN (hypertension)   . WPW (Wolff-Parkinson-White syndrome) loss of preexcitation   . Atrial fibrillation   . AV block, Mobitz II   . Presyncope   . Myocardial infarction   . Pacemaker 04/07/2013  . Sleep apnea     USES CPAP  . Arthritis   . History of chicken pox   . Hyperlipidemia   . Anemia   . Hypernatremia 06/03/2013  . Thrombocytopenia, unspecified 06/03/2013  . Leukopenia 06/03/2013  . Obstructive sleep apnea 06/03/2013  . Urinary incontinence 06/03/2013  . Atrial fibrillation     Past Surgical History  Procedure Laterality Date  . Coronary angioplasty with stent placement    . Vastectomy    .  Insert / replace / remove pacemaker  04/07/2013    History   Social History  . Marital Status: Married    Spouse Name: N/A    Number of Children: 3  . Years of Education: N/A   Occupational History  .      Retired   Social History Main Topics  . Smoking status: Former Smoker    Quit date: 12/05/1991  . Smokeless tobacco: Never Used  . Alcohol Use: Yes     Comment: Occasional  . Drug Use: No  . Sexual Activity: Not on file   Other Topics Concern  . Not on file   Social History Narrative  . No narrative on file    ROS: no fevers or chills, productive cough, hemoptysis, dysphasia, odynophagia, melena, hematochezia, dysuria, hematuria, rash, seizure activity, orthopnea,  PND, pedal edema, claudication. Remaining systems are negative.  Physical Exam: Well-developed well-nourished in no acute distress.  Skin is warm and dry.  HEENT is normal.  Neck is supple.  Chest is clear to auscultation with normal expansion.  Cardiovascular exam is regular rate and rhythm. 2/6 systolic murmur apex Abdominal exam nontender or distended. No masses palpated. Extremities show no edema. neuro grossly intact

## 2013-08-13 NOTE — Assessment & Plan Note (Signed)
Continue statin. 

## 2013-10-09 ENCOUNTER — Encounter: Payer: Self-pay | Admitting: Family Medicine

## 2013-10-09 ENCOUNTER — Other Ambulatory Visit: Payer: Self-pay

## 2013-11-12 ENCOUNTER — Telehealth: Payer: Self-pay

## 2013-11-12 NOTE — Telephone Encounter (Signed)
Patient has appointment on 11/18/13 for a 3 month follow-up with Dr. Abner Greenspan.  After reviewing most recent office note and lab results, it seems he has periodically had a CBC with low platelet counts.  Maybe she was going to recheck levels?  I would just wait and either message her directly or just let her place labs at patient's appointment.

## 2013-11-12 NOTE — Telephone Encounter (Signed)
Patient came in to get labs drawn today.  I don't see where Dr Abner Greenspan asked pt to come in?  Please advise if any lab orders need to be put in?

## 2013-11-13 NOTE — Telephone Encounter (Signed)
I spoke Katrina and the chemistry is good for 7 days but the hematology part is only good for 24 hours. So we will just have to redraw if MD wants labs

## 2013-11-17 ENCOUNTER — Telehealth: Payer: Self-pay

## 2013-11-17 DIAGNOSIS — D649 Anemia, unspecified: Secondary | ICD-10-CM

## 2013-11-17 DIAGNOSIS — I10 Essential (primary) hypertension: Secondary | ICD-10-CM

## 2013-11-17 DIAGNOSIS — E785 Hyperlipidemia, unspecified: Secondary | ICD-10-CM

## 2013-11-17 NOTE — Telephone Encounter (Signed)
Patient called stating he was wanting his lab results. Pt has an appt tomorrow with Dr Abner Greenspan.  This is the conversation from last week:  Court Joy, RMA at 11/13/2013  2:45 PM      Status: Signed            I spoke Katrina and the chemistry is good for 7 days but the hematology part is only good for 24 hours. So we will just have to redraw if MD wants labs         Piedad Climes, PA-C at 11/12/2013  8:37 PM      Status: Signed            Patient has appointment on 11/18/13 for a 3 month follow-up with Dr. Abner Greenspan.  After reviewing most recent office note and lab results, it seems he has periodically had a CBC with low platelet counts.  Maybe she was going to recheck levels?  I would just wait and either message her directly or just let her place labs at patient's appointment.         Court Joy, RMA at 11/12/2013  9:26 AM      Status: Signed            Patient came in to get labs drawn today.   I don't see where Dr Abner Greenspan asked pt to come in?   Please advise if any lab orders need to be put in?    Pt is upset and yelling at me due to this being a big mess and that "I" even wrote down what he needed on a piece of paper? I explained several times that "I" was not the one that saw him yesterday. Pt stated yes you were the one I saw last week.

## 2013-11-17 NOTE — Telephone Encounter (Signed)
FYI:  Pt informed and states he will just reschedule. Pt wants to know how the mistake won't happen again? I informed pt that I was putting the lab orders in and transferring the call to the front desk to reschedule. Pt stated that this was not helping him understand that this won't happen again. I tried to explain again that I can't explain what someone else had done.  Pt agreed to come in for labs and then stated that he would transfer.  I transferred the call to Victorino Dike to reschedule and she called me back 2 times because the guy had questions that had already been answered. Victorino Dike then back for the 3rd time to have me please talk to the pt.   I took the call again and explained to the pt what we were ordering. Pt states that is not what is wrote on his paper? I asked pt to just bring in the paper with him and we will look at it to see what the MD wrote. Pt stated why don't you just order what she wrote? I tried to explain that we would need to see the paper. Pt then stated "why don't I just ask the doctor what she wants ordered". I told the pt I was not going to argue with him or have him getting "rude" with me and he hung up.  I then when to get a pt and heard Judeth Cornfield trying to explain the same thing to him. Bethann Berkshire then went to tell Judeth Cornfield to have the pt bring the paper in that MD wrote on and we would go from there.  I didn't put orders in. Will order when pt brings in paperwork

## 2013-11-17 NOTE — Telephone Encounter (Signed)
So offer a reschedule if he wants, I am fine for him to do labs while he is here if he does not want to reschedule or he can do labs tomorrow and see me after the holidays

## 2013-11-17 NOTE — Telephone Encounter (Signed)
Needs cbc, renal, hepatic, for anemia, abnormal liver tests and renal insufficiency, not fasting tests

## 2013-11-18 ENCOUNTER — Ambulatory Visit: Payer: Medicare Other | Admitting: Family Medicine

## 2013-11-18 LAB — CBC
HCT: 40.6 % (ref 39.0–52.0)
MCHC: 35.2 g/dL (ref 30.0–36.0)
MCV: 95.1 fL (ref 78.0–100.0)
Platelets: 137 10*3/uL — ABNORMAL LOW (ref 150–400)
RDW: 13.5 % (ref 11.5–15.5)
WBC: 4.7 10*3/uL (ref 4.0–10.5)

## 2013-11-18 LAB — RENAL FUNCTION PANEL
Albumin: 4.4 g/dL (ref 3.5–5.2)
BUN: 23 mg/dL (ref 6–23)
CO2: 28 mEq/L (ref 19–32)
Calcium: 9.5 mg/dL (ref 8.4–10.5)
Chloride: 104 mEq/L (ref 96–112)
Creat: 1.16 mg/dL (ref 0.50–1.35)
Phosphorus: 2.8 mg/dL (ref 2.3–4.6)

## 2013-11-18 LAB — TSH: TSH: 3.207 u[IU]/mL (ref 0.350–4.500)

## 2013-11-18 LAB — LIPID PANEL
Cholesterol: 135 mg/dL (ref 0–200)
HDL: 54 mg/dL (ref >39)
Total CHOL/HDL Ratio: 2.5 Ratio
Triglycerides: 63 mg/dL (ref <150)

## 2013-11-18 LAB — HEPATIC FUNCTION PANEL
ALT: 20 U/L (ref 0–53)
AST: 29 U/L (ref 0–37)
Albumin: 4.4 g/dL (ref 3.5–5.2)
Bilirubin, Direct: 0.2 mg/dL (ref 0.0–0.3)
Indirect Bilirubin: 0.5 mg/dL (ref 0.0–0.9)

## 2013-12-16 ENCOUNTER — Encounter: Payer: Self-pay | Admitting: Family Medicine

## 2013-12-16 ENCOUNTER — Ambulatory Visit (INDEPENDENT_AMBULATORY_CARE_PROVIDER_SITE_OTHER): Payer: Medicare Other | Admitting: Family Medicine

## 2013-12-16 ENCOUNTER — Telehealth: Payer: Self-pay | Admitting: Family Medicine

## 2013-12-16 VITALS — BP 154/80 | HR 73 | Temp 98.0°F | Ht 71.0 in | Wt 187.1 lb

## 2013-12-16 DIAGNOSIS — E785 Hyperlipidemia, unspecified: Secondary | ICD-10-CM

## 2013-12-16 DIAGNOSIS — Z0000 Encounter for general adult medical examination without abnormal findings: Secondary | ICD-10-CM

## 2013-12-16 DIAGNOSIS — I2510 Atherosclerotic heart disease of native coronary artery without angina pectoris: Secondary | ICD-10-CM

## 2013-12-16 DIAGNOSIS — D696 Thrombocytopenia, unspecified: Secondary | ICD-10-CM

## 2013-12-16 DIAGNOSIS — I10 Essential (primary) hypertension: Secondary | ICD-10-CM

## 2013-12-16 DIAGNOSIS — N400 Enlarged prostate without lower urinary tract symptoms: Secondary | ICD-10-CM

## 2013-12-16 DIAGNOSIS — H9190 Unspecified hearing loss, unspecified ear: Secondary | ICD-10-CM

## 2013-12-16 DIAGNOSIS — D649 Anemia, unspecified: Secondary | ICD-10-CM

## 2013-12-16 DIAGNOSIS — R7309 Other abnormal glucose: Secondary | ICD-10-CM

## 2013-12-16 DIAGNOSIS — I4891 Unspecified atrial fibrillation: Secondary | ICD-10-CM

## 2013-12-16 DIAGNOSIS — R739 Hyperglycemia, unspecified: Secondary | ICD-10-CM

## 2013-12-16 MED ORDER — LOSARTAN POTASSIUM 25 MG PO TABS: 25.0000 mg | ORAL_TABLET | Freq: Every day | ORAL | Status: DC

## 2013-12-16 NOTE — Progress Notes (Signed)
Patient ID: Justin [name], male   DOB: 09-Sep-1933, 80 y.o.   MRN: [id number]   CC: 3 mo f/u HTN & anemia.   HPI: Pt reports no new medical complaints. Pt reports Flomax for BPH is very helpful. Denies dry mouth or lightheadedness. Taking medications as prescribed. No recent illness or fevers  NUU:VOZDGU of Systems  Constitutional: Negative for fever, chills and malaise/fatigue.  Respiratory: Negative for cough and shortness of breath.   Cardiovascular: Negative for chest pain, palpitations, orthopnea and leg swelling.  Gastrointestinal: Negative for nausea, vomiting, abdominal pain, diarrhea and constipation.  Genitourinary: Negative for dysuria, urgency and frequency.  Musculoskeletal: Negative for falls and myalgias.  Endo/Heme/Allergies: Negative for polydipsia.    Past Medical History  Diagnosis Date  . CAD (coronary artery disease) prior stenting 1999   . Dyslipidemia   . HTN (hypertension)   . WPW (Wolff-Parkinson-White syndrome) loss of preexcitation   . Atrial fibrillation   . AV block, Mobitz II   . Presyncope   . Myocardial infarction   . Pacemaker 04/07/2013  . Sleep apnea     USES CPAP  . Arthritis   . History of chicken pox   . Hyperlipidemia   . Anemia   . Hypernatremia 06/03/2013  . Thrombocytopenia, unspecified 06/03/2013  . Leukopenia 06/03/2013  . Obstructive sleep apnea 06/03/2013  . Urinary incontinence 06/03/2013  . Atrial fibrillation     Family History  Problem Relation Age of Onset  . Stroke Mother   . Hypertension Mother   . Stroke Sister   . Arthritis Sister     rheumatoid  . Heart attack Sister   . Anemia Sister   . Other Brother     tube put in aorta  . Anemia Brother   . Other Son     cortisone deficiency  . Arthritis Son   . Stroke Brother   . Alcohol abuse Brother   . Barrett's esophagus Son    No Known Allergies  Current Outpatient Prescriptions on File Prior to Visit  Medication Sig Dispense Refill  . apixaban (ELIQUIS) 5 MG TABS  tablet Take 1 tablet (5 mg total) by mouth 2 (two) times daily.  180 tablet  3  . atorvastatin (LIPITOR) 10 MG tablet Take 10 mg by mouth every evening.      . Azelaic Acid (FINACEA) 15 % cream Apply 1 application topically daily. After skin is thoroughly washed and patted dry, gently but thoroughly massage a thin film of azelaic acid creame      . Cholecalciferol (VITAMIN D-3) 1000 UNITS CAPS Take 1 capsule by mouth daily.      . fluticasone (FLONASE) 50 MCG/ACT nasal spray Place 1 spray into the nose daily as needed for allergies. For seasonal allergies      . loratadine (CLARITIN) 10 MG tablet Take 10 mg by mouth daily as needed for allergies. For seasonal allergies      . Multiple Vitamin (MULTIVITAMIN WITH MINERALS) TABS Take 1 tablet by mouth 2 (two) times a week.       . nadolol (CORGARD) 20 MG tablet TAKE ONE HALF TABLET ONCE DAILY  90 tablet  4  . nitroGLYCERIN (NITROSTAT) 0.4 MG SL tablet Place 0.4 mg under the tongue every 5 (five) minutes as needed for chest pain. x3 doses as needed for chest pain      . pyridOXINE (VITAMIN B-6) 100 MG tablet Take 100 mg by mouth every evening.      . tamsulosin (FLOMAX)  0.4 MG CAPS capsule Take 0.4 mg by mouth at bedtime.      . vitamin B-12 (CYANOCOBALAMIN) 1000 MCG tablet Take 1,000 mcg by mouth every evening.       No current facility-administered medications on file prior to visit.   Physical Exam  Constitutional: He is oriented to person, place, and time and well-developed, well-nourished, and in no distress. He appears not dehydrated and not jaundiced.  Non-toxic appearance. No distress.  HENT:  Head: Normocephalic and atraumatic.  Right Ear: Hearing and external ear normal.  Left Ear: Hearing and external ear normal.  Pt wears hearing aids bilaterally.   Eyes: Right eye exhibits no discharge. Left eye exhibits no discharge.  Neck: No tracheal deviation present.  Cardiovascular: Normal rate, regular rhythm and normal heart sounds.  Exam  reveals no gallop and no friction rub.   No murmur heard. Pt has pacemaker  Pulmonary/Chest: Effort normal and breath sounds normal. No stridor. No respiratory distress. He has no wheezes. He has no rales.  Musculoskeletal: He exhibits no edema.  Neurological: He is alert and oriented to person, place, and time. Gait normal.  Skin: Skin is warm and dry.  Psychiatric: Memory, affect and judgment normal.     Labs reviewed with patient.  Recent Results (from the past 2160 hour(s))  LIPID PANEL     Status: None   Collection Time    11/18/13 11:36 AM      Result Value Range   Cholesterol 135  0 - 200 mg/dL   Comment: ATP III Classification:           < 200        mg/dL        Desirable          200 - 239     mg/dL        Borderline High          >= 240        mg/dL        High         Triglycerides 63  <150 mg/dL   HDL 54  >39 mg/dL   Total CHOL/HDL Ratio 2.5     VLDL 13  0 - 40 mg/dL   LDL Cholesterol 68  0 - 99 mg/dL   Comment:       Total Cholesterol/HDL Ratio:CHD Risk                            Coronary Heart Disease Risk Table                                            Men       Women              1/2 Average Risk              3.4        3.3                  Average Risk              5.0        4.4               2X Average Risk  9.6        7.1               3X Average Risk             23.4       11.0     Use the calculated Patient Ratio above and the CHD Risk table      to determine the patient's CHD Risk.     ATP III Classification (LDL):           < 100        mg/dL         Optimal          100 - 129     mg/dL         Near or Above Optimal          130 - 159     mg/dL         Borderline High          160 - 189     mg/dL         High           > 190        mg/dL         Very High        RENAL FUNCTION PANEL     Status: Abnormal   Collection Time    11/18/13 11:36 AM      Result Value Range   Sodium 141  135 - 145 mEq/L   Potassium 4.7  3.5 - 5.3 mEq/L    Chloride 104  96 - 112 mEq/L   CO2 28  19 - 32 mEq/L   Glucose, Bld 119 (*) 70 - 99 mg/dL   BUN 23  6 - 23 mg/dL   Creat 1.16  0.50 - 1.35 mg/dL   Albumin 4.4  3.5 - 5.2 g/dL   Calcium 9.5  8.4 - 10.5 mg/dL   Phosphorus 2.8  2.3 - 4.6 mg/dL  CBC     Status: Abnormal   Collection Time    11/18/13 11:36 AM      Result Value Range   WBC 4.7  4.0 - 10.5 K/uL   RBC 4.27  4.22 - 5.81 MIL/uL   Hemoglobin 14.3  13.0 - 17.0 g/dL   HCT 40.6  39.0 - 52.0 %   MCV 95.1  78.0 - 100.0 fL   MCH 33.5  26.0 - 34.0 pg   MCHC 35.2  30.0 - 36.0 g/dL   RDW 13.5  11.5 - 15.5 %   Platelets 137 (*) 150 - 400 K/uL  TSH     Status: None   Collection Time    11/18/13 11:36 AM      Result Value Range   TSH 3.207  0.350 - 4.500 uIU/mL  HEPATIC FUNCTION PANEL     Status: None   Collection Time    11/18/13 11:36 AM      Result Value Range   Total Bilirubin 0.7  0.3 - 1.2 mg/dL   Bilirubin, Direct 0.2  0.0 - 0.3 mg/dL   Indirect Bilirubin 0.5  0.0 - 0.9 mg/dL   Alkaline Phosphatase 50  39 - 117 U/L   AST 29  0 - 37 U/L   ALT 20  0 - 53 U/L   Total Protein 7.5  6.0 - 8.3 g/dL   Albumin 4.4  3.5 - 5.2 g/dL    Assessment  1.  HTN uncontrolled with medications 2. BPH controlled with Flomax 3. A Fib, CVA risk controlled with Elliquis 4. Pacemaker, monitored by cardiology 5. H/o cerebrovascular disease 6. H/o OSA 7. H/o CAD  Plan  Pharm: Add Losartan x mg once per day.  Non-pharm: Continue to check BP at home. Pt to set up pacemaker home device.  Pt. Educaton: Discussed cholesterol panel and cbc improvements. Pt understands risks/benefits of taking Losartan for blood pressure regimen.  Referral: [facility] pt [facility].  Labs/diagnostics: See below. Follow up:  2-3 weeks to check BP and medication tolerance  3-4 months with labs prior, including CBC, Renal panel, Liver panel, TSH, Lipid panel, HgbA1c   Patient seen, interviewed and examined with patient agree  with documentation per above.  HTN (hypertension) Add Losartan 25 mg daily, attempt DASH diet  CAD (coronary artery disease) Asymptomatic, follows with cardiology  Atrial fibrillation Tolerating Eliquis RRR with pacer  Thrombocytopenia, unspecified Mild, asymptomatic, recheck at next visit.  Hearing loss Referred to audiology for further consideration  Hyperlipidemia Well controlled on Atorvastatin, avoid trans fats  Hyperglycemia Minimize simple carbs, check hgba1c with next blood draw  BPH (benign prostatic hyperplasia) Good response to Flomax

## 2013-12-16 NOTE — Patient Instructions (Signed)

## 2013-12-16 NOTE — Telephone Encounter (Signed)
Lab order week of 01-12-2014 -4 weeks nurse visit for BP check and labs to include, lipid, renal, cbc, hgba1c, tsh, hepatic then repeat these same l

## 2013-12-16 NOTE — Progress Notes (Signed)
Pre visit review using our clinic review tool, if applicable. No additional management support is needed unless otherwise documented below in the visit note. 

## 2013-12-17 ENCOUNTER — Telehealth: Payer: Self-pay | Admitting: Family Medicine

## 2013-12-17 NOTE — Telephone Encounter (Signed)
Relevant patient education assigned to patient using Emmi. ° °

## 2013-12-19 NOTE — Telephone Encounter (Signed)
Lab order placed except the A1c. It won't let me order

## 2013-12-21 ENCOUNTER — Encounter: Payer: Self-pay | Admitting: Family Medicine

## 2013-12-21 DIAGNOSIS — R739 Hyperglycemia, unspecified: Secondary | ICD-10-CM | POA: Insufficient documentation

## 2013-12-21 DIAGNOSIS — H9190 Unspecified hearing loss, unspecified ear: Secondary | ICD-10-CM | POA: Insufficient documentation

## 2013-12-21 HISTORY — DX: Hyperglycemia, unspecified: R73.9

## 2013-12-21 NOTE — Assessment & Plan Note (Signed)
Asymptomatic, follows with cardiology 

## 2013-12-21 NOTE — Assessment & Plan Note (Signed)
Tolerating Eliquis RRR with pacer

## 2013-12-21 NOTE — Assessment & Plan Note (Signed)
Mild, asymptomatic, recheck at next visit.

## 2013-12-21 NOTE — Assessment & Plan Note (Signed)
Well controlled on Atorvastatin, avoid trans fats

## 2013-12-21 NOTE — Assessment & Plan Note (Signed)
Good response to Flomax 

## 2013-12-21 NOTE — Assessment & Plan Note (Signed)
Minimize simple carbs, check hgba1c with next blood draw

## 2013-12-21 NOTE — Assessment & Plan Note (Signed)
Referred to audiology for further consideration. 

## 2013-12-21 NOTE — Assessment & Plan Note (Signed)
Add Losartan 25 mg daily, attempt DASH diet

## 2014-01-13 ENCOUNTER — Ambulatory Visit (INDEPENDENT_AMBULATORY_CARE_PROVIDER_SITE_OTHER): Payer: Medicare Other | Admitting: Family

## 2014-01-13 VITALS — BP 140/86 | HR 65 | Temp 97.8°F | Resp 16 | Ht 71.0 in | Wt 185.0 lb

## 2014-01-13 DIAGNOSIS — I10 Essential (primary) hypertension: Secondary | ICD-10-CM

## 2014-01-13 LAB — RENAL FUNCTION PANEL
ALBUMIN: 4 g/dL (ref 3.5–5.2)
BUN: 23 mg/dL (ref 6–23)
CALCIUM: 9.4 mg/dL (ref 8.4–10.5)
CO2: 28 mEq/L (ref 19–32)
CREATININE: 1.11 mg/dL (ref 0.50–1.35)
Chloride: 104 mEq/L (ref 96–112)
Glucose, Bld: 97 mg/dL (ref 70–99)
Phosphorus: 3.1 mg/dL (ref 2.3–4.6)
Potassium: 4.5 mEq/L (ref 3.5–5.3)
SODIUM: 141 meq/L (ref 135–145)

## 2014-01-13 LAB — HEPATIC FUNCTION PANEL
ALT: 19 U/L (ref 0–53)
AST: 28 U/L (ref 0–37)
Albumin: 4 g/dL (ref 3.5–5.2)
Alkaline Phosphatase: 45 U/L (ref 39–117)
BILIRUBIN DIRECT: 0.2 mg/dL (ref 0.0–0.3)
Indirect Bilirubin: 0.6 mg/dL (ref 0.2–1.2)
Total Bilirubin: 0.8 mg/dL (ref 0.2–1.2)
Total Protein: 7.1 g/dL (ref 6.0–8.3)

## 2014-01-13 LAB — TSH: TSH: 1.892 u[IU]/mL (ref 0.350–4.500)

## 2014-01-13 LAB — CBC
HCT: 38.9 % — ABNORMAL LOW (ref 39.0–52.0)
Hemoglobin: 13.6 g/dL (ref 13.0–17.0)
MCH: 33.3 pg (ref 26.0–34.0)
MCHC: 35 g/dL (ref 30.0–36.0)
MCV: 95.3 fL (ref 78.0–100.0)
PLATELETS: 124 10*3/uL — AB (ref 150–400)
RBC: 4.08 MIL/uL — AB (ref 4.22–5.81)
RDW: 13.4 % (ref 11.5–15.5)
WBC: 3.5 10*3/uL — AB (ref 4.0–10.5)

## 2014-01-13 LAB — LIPID PANEL
Cholesterol: 126 mg/dL (ref 0–200)
HDL: 49 mg/dL (ref >39)
LDL CALC: 64 mg/dL (ref 0–99)
Total CHOL/HDL Ratio: 2.6 Ratio
Triglycerides: 66 mg/dL (ref <150)
VLDL: 13 mg/dL (ref 0–40)

## 2014-01-13 NOTE — Assessment & Plan Note (Signed)
BP Readings from Last 3 Encounters:  01/13/14 140/86  12/21/13 154/80  08/13/13 130/80   Continue current meds.  No changes.

## 2014-02-24 ENCOUNTER — Encounter: Payer: Self-pay | Admitting: Cardiology

## 2014-03-13 ENCOUNTER — Encounter: Payer: Self-pay | Admitting: Cardiology

## 2014-03-17 ENCOUNTER — Other Ambulatory Visit: Payer: Self-pay

## 2014-03-17 ENCOUNTER — Other Ambulatory Visit: Payer: Self-pay | Admitting: *Deleted

## 2014-03-17 DIAGNOSIS — I4891 Unspecified atrial fibrillation: Secondary | ICD-10-CM

## 2014-03-17 MED ORDER — ATORVASTATIN CALCIUM 10 MG PO TABS: 10.0000 mg | ORAL_TABLET | Freq: Every evening | ORAL | Status: DC

## 2014-03-17 MED ORDER — APIXABAN 5 MG PO TABS: 5.0000 mg | ORAL_TABLET | Freq: Two times a day (BID) | ORAL | Status: DC

## 2014-03-18 ENCOUNTER — Encounter: Payer: Self-pay | Admitting: Cardiology

## 2014-03-18 ENCOUNTER — Ambulatory Visit (INDEPENDENT_AMBULATORY_CARE_PROVIDER_SITE_OTHER): Payer: Medicare Other | Admitting: Cardiology

## 2014-03-18 VITALS — BP 138/80 | HR 73 | Ht 71.0 in | Wt 180.0 lb

## 2014-03-18 DIAGNOSIS — I4891 Unspecified atrial fibrillation: Secondary | ICD-10-CM

## 2014-03-18 DIAGNOSIS — I10 Essential (primary) hypertension: Secondary | ICD-10-CM

## 2014-03-18 DIAGNOSIS — I2510 Atherosclerotic heart disease of native coronary artery without angina pectoris: Secondary | ICD-10-CM

## 2014-03-18 MED ORDER — APIXABAN 5 MG PO TABS: 5.0000 mg | ORAL_TABLET | Freq: Two times a day (BID) | ORAL | Status: DC

## 2014-03-18 MED ORDER — ATORVASTATIN CALCIUM 10 MG PO TABS: 10.0000 mg | ORAL_TABLET | Freq: Every evening | ORAL | Status: AC

## 2014-03-18 MED ORDER — NITROGLYCERIN 0.4 MG SL SUBL: 0.4000 mg | SUBLINGUAL_TABLET | SUBLINGUAL | Status: AC | PRN

## 2014-03-18 MED ORDER — LOSARTAN POTASSIUM 25 MG PO TABS: 25.0000 mg | ORAL_TABLET | Freq: Every day | ORAL | Status: DC

## 2014-03-18 MED ORDER — NADOLOL 20 MG PO TABS: ORAL_TABLET | ORAL | Status: DC

## 2014-03-18 NOTE — Assessment & Plan Note (Signed)
Management per electrophysiology. 

## 2014-03-18 NOTE — Assessment & Plan Note (Signed)
Followup carotid Dopplers April 2016. 

## 2014-03-18 NOTE — Assessment & Plan Note (Signed)
Blood pressure controlled. Continue present medications. 

## 2014-03-18 NOTE — Assessment & Plan Note (Signed)
Plan continue beta blocker and apixaban.

## 2014-03-18 NOTE — Patient Instructions (Signed)
Your physician wants you to follow-up in: [place] will receive a reminder letter in the mail two months in advance. If you don't receive a letter, please call our office to schedule the follow-up appointment. b

## 2014-03-18 NOTE — Assessment & Plan Note (Signed)
Continue statin. Not on aspirin given need for Anticoagulation. 

## 2014-03-18 NOTE — Assessment & Plan Note (Signed)
Followup echocardiogram one year.

## 2014-03-18 NOTE — Progress Notes (Signed)
HPI: FU coronary artery disease and atrial fibrillation. The patient had a previous LAD infarct and had PCI of his LAD in April of 1999. Carotid Dopplers in April of 2014 showed 0-39% bilateral stenosis. Followup recommended in 2 years. Last nuclear study in April of 2014 showed no scar or ischemia. There was possible atypical flutter in recovery. Abdominal ultrasound in May 2014 showed no aneurysm. Previous monitor showed symptomatic 2:1 AV block and paroxysmal atrial fibrillation. Patient had a pacemaker placed. Echocardiogram in June of 2014 showed an ejection fraction of 50-55%, moderate mitral regurgitation, trace aortic insufficiency and mild left atrial enlargement. Since I last saw him the patient denies any dyspnea on exertion, orthopnea, PND, pedal edema, palpitations, syncope or chest pain.   Current Outpatient Prescriptions  Medication Sig Dispense Refill  . apixaban (ELIQUIS) 5 MG TABS tablet Take 1 tablet (5 mg total) by mouth 2 (two) times daily.  180 tablet  0  . atorvastatin (LIPITOR) 10 MG tablet Take 1 tablet (10 mg total) by mouth every evening.  30 tablet  3  . Azelaic Acid (FINACEA) 15 % cream Apply 1 application topically daily. After skin is thoroughly washed and patted dry, gently but thoroughly massage a thin film of azelaic acid creame      . Cholecalciferol (VITAMIN D-3) 1000 UNITS CAPS Take 1 capsule by mouth daily.      . fluticasone (FLONASE) 50 MCG/ACT nasal spray Place 1 spray into the nose 2 (two) times daily. For seasonal allergies      . losartan (COZAAR) 25 MG tablet Take 1 tablet (25 mg total) by mouth daily.  30 tablet  3  . Multiple Vitamin (MULTIVITAMIN WITH MINERALS) TABS Take 1 tablet by mouth 2 (two) times a week.       . nadolol (CORGARD) 20 MG tablet TAKE ONE HALF TABLET ONCE DAILY  90 tablet  4  . nitroGLYCERIN (NITROSTAT) 0.4 MG SL tablet Place 0.4 mg under the tongue every 5 (five) minutes as needed for chest pain. x3 doses as needed for chest  pain      . Probiotic Product (PROBIOTIC DAILY PO) Take by mouth daily.      Marland Kitchen pyridOXINE (VITAMIN B-6) 100 MG tablet Take 100 mg by mouth every evening.      . tamsulosin (FLOMAX) 0.4 MG CAPS capsule Take 0.4 mg by mouth at bedtime.      . vitamin B-12 (CYANOCOBALAMIN) 1000 MCG tablet Take 1,000 mcg by mouth every evening.       No current facility-administered medications for this visit.     Past Medical History  Diagnosis Date  . CAD (coronary artery disease) prior stenting 1999   . Dyslipidemia   . HTN (hypertension)   . WPW (Wolff-Parkinson-White syndrome) loss of preexcitation   . Atrial fibrillation   . AV block, Mobitz II   . Presyncope   . Myocardial infarction   . Pacemaker 04/07/2013  . Sleep apnea     USES CPAP  . Arthritis   . History of chicken pox   . Hyperlipidemia   . Anemia   . Hypernatremia 06/03/2013  . Thrombocytopenia, unspecified 06/03/2013  . Leukopenia 06/03/2013  . Obstructive sleep apnea 06/03/2013  . Urinary incontinence 06/03/2013  . Atrial fibrillation   . Hyperglycemia 12/21/2013    Past Surgical History  Procedure Laterality Date  . Coronary angioplasty with stent placement    . Vastectomy    . Insert / replace / remove  pacemaker  04/07/2013    History   Social History  . Marital Status: Married    Spouse Name: N/A    Number of Children: 3  . Years of Education: N/A   Occupational History  .      Retired   Social History Main Topics  . Smoking status: Former Smoker    Quit date: 12/05/1991  . Smokeless tobacco: Never Used  . Alcohol Use: Yes     Comment: Occasional  . Drug Use: No  . Sexual Activity: Not on file   Other Topics Concern  . Not on file   Social History Narrative  . No narrative on file    ROS: no fevers or chills, productive cough, hemoptysis, dysphasia, odynophagia, melena, hematochezia, dysuria, hematuria, rash, seizure activity, orthopnea, PND, pedal edema, claudication. Remaining systems are  negative.  Physical Exam: Well-developed well-nourished in no acute distress.  Skin is warm and dry.  HEENT is normal.  Neck is supple.  Chest is clear to auscultation with normal expansion. Pacemaker left chest Cardiovascular exam is regular rate and rhythm.  Abdominal exam nontender or distended. No masses palpated. Extremities show no edema. neuro grossly intact  ECG AV paced.

## 2014-03-18 NOTE — Assessment & Plan Note (Signed)
Continue statin. 

## 2014-03-31 ENCOUNTER — Encounter: Payer: Medicare Other | Admitting: Cardiology

## 2014-04-14 ENCOUNTER — Encounter: Payer: Self-pay | Admitting: Family Medicine

## 2014-04-14 ENCOUNTER — Ambulatory Visit (INDEPENDENT_AMBULATORY_CARE_PROVIDER_SITE_OTHER): Payer: Medicare Other | Admitting: Family Medicine

## 2014-04-14 VITALS — BP 128/62 | HR 76 | Temp 98.2°F | Ht 71.0 in | Wt 183.1 lb

## 2014-04-14 DIAGNOSIS — R6889 Other general symptoms and signs: Secondary | ICD-10-CM

## 2014-04-14 DIAGNOSIS — E785 Hyperlipidemia, unspecified: Secondary | ICD-10-CM

## 2014-04-14 DIAGNOSIS — R739 Hyperglycemia, unspecified: Secondary | ICD-10-CM

## 2014-04-14 DIAGNOSIS — D649 Anemia, unspecified: Secondary | ICD-10-CM

## 2014-04-14 DIAGNOSIS — R7309 Other abnormal glucose: Secondary | ICD-10-CM

## 2014-04-14 DIAGNOSIS — Z23 Encounter for immunization: Secondary | ICD-10-CM

## 2014-04-14 DIAGNOSIS — I2510 Atherosclerotic heart disease of native coronary artery without angina pectoris: Secondary | ICD-10-CM

## 2014-04-14 DIAGNOSIS — I10 Essential (primary) hypertension: Secondary | ICD-10-CM

## 2014-04-14 LAB — CBC
HEMATOCRIT: 31.6 % — AB (ref 39.0–52.0)
HEMOGLOBIN: 10.8 g/dL — AB (ref 13.0–17.0)
MCH: 32.4 pg (ref 26.0–34.0)
MCHC: 34.2 g/dL (ref 30.0–36.0)
MCV: 94.9 fL (ref 78.0–100.0)
Platelets: 97 10*3/uL — ABNORMAL LOW (ref 150–400)
RBC: 3.33 MIL/uL — ABNORMAL LOW (ref 4.22–5.81)
RDW: 13.3 % (ref 11.5–15.5)
WBC: 5.8 10*3/uL (ref 4.0–10.5)

## 2014-04-14 LAB — HEMOGLOBIN A1C
Hgb A1c MFr Bld: 5.5 %
Mean Plasma Glucose: 111 mg/dL

## 2014-04-14 LAB — RENAL FUNCTION PANEL
ALBUMIN: 3.8 g/dL (ref 3.5–5.2)
BUN: 23 mg/dL (ref 6–23)
CALCIUM: 8.8 mg/dL (ref 8.4–10.5)
CO2: 25 mEq/L (ref 19–32)
CREATININE: 1.25 mg/dL (ref 0.50–1.35)
Chloride: 105 mEq/L (ref 96–112)
GLUCOSE: 102 mg/dL — AB (ref 70–99)
Phosphorus: 2.6 mg/dL (ref 2.3–4.6)
Potassium: 4.7 mEq/L (ref 3.5–5.3)
Sodium: 139 mEq/L (ref 135–145)

## 2014-04-14 LAB — TSH: TSH: 1.311 u[IU]/mL (ref 0.350–4.500)

## 2014-04-14 NOTE — Progress Notes (Signed)
Pre visit review using our clinic review tool, if applicable. No additional management support is needed unless otherwise documented below in the visit note. 

## 2014-04-14 NOTE — Patient Instructions (Signed)

## 2014-04-16 ENCOUNTER — Telehealth: Payer: Self-pay | Admitting: Family Medicine

## 2014-04-16 DIAGNOSIS — D649 Anemia, unspecified: Secondary | ICD-10-CM

## 2014-04-16 NOTE — Telephone Encounter (Signed)
Patient called back stating that a nurse does not need to call him since he has appointment tomorrow

## 2014-04-16 NOTE — Telephone Encounter (Signed)
Patient says that he has questions regarding last labs. He states that he can see the results in mychart but still has questions.

## 2014-04-17 ENCOUNTER — Encounter: Payer: Self-pay | Admitting: Family Medicine

## 2014-04-17 ENCOUNTER — Ambulatory Visit (INDEPENDENT_AMBULATORY_CARE_PROVIDER_SITE_OTHER): Payer: Medicare Other | Admitting: Family Medicine

## 2014-04-17 VITALS — BP 148/54 | HR 74 | Temp 98.4°F | Ht 71.0 in | Wt 183.1 lb

## 2014-04-17 DIAGNOSIS — R55 Syncope and collapse: Secondary | ICD-10-CM

## 2014-04-17 DIAGNOSIS — R0609 Other forms of dyspnea: Secondary | ICD-10-CM

## 2014-04-17 DIAGNOSIS — I10 Essential (primary) hypertension: Secondary | ICD-10-CM

## 2014-04-17 DIAGNOSIS — R5381 Other malaise: Secondary | ICD-10-CM

## 2014-04-17 DIAGNOSIS — R6889 Other general symptoms and signs: Secondary | ICD-10-CM

## 2014-04-17 DIAGNOSIS — E785 Hyperlipidemia, unspecified: Secondary | ICD-10-CM

## 2014-04-17 DIAGNOSIS — R5383 Other fatigue: Secondary | ICD-10-CM

## 2014-04-17 DIAGNOSIS — R739 Hyperglycemia, unspecified: Secondary | ICD-10-CM

## 2014-04-17 DIAGNOSIS — I4891 Unspecified atrial fibrillation: Secondary | ICD-10-CM

## 2014-04-17 DIAGNOSIS — D649 Anemia, unspecified: Secondary | ICD-10-CM

## 2014-04-17 DIAGNOSIS — R0989 Other specified symptoms and signs involving the circulatory and respiratory systems: Secondary | ICD-10-CM

## 2014-04-17 DIAGNOSIS — I2510 Atherosclerotic heart disease of native coronary artery without angina pectoris: Secondary | ICD-10-CM

## 2014-04-17 DIAGNOSIS — D61818 Other pancytopenia: Secondary | ICD-10-CM

## 2014-04-17 DIAGNOSIS — R002 Palpitations: Secondary | ICD-10-CM

## 2014-04-17 DIAGNOSIS — R0600 Dyspnea, unspecified: Secondary | ICD-10-CM

## 2014-04-17 DIAGNOSIS — R7309 Other abnormal glucose: Secondary | ICD-10-CM

## 2014-04-17 NOTE — Patient Instructions (Signed)

## 2014-04-17 NOTE — Progress Notes (Signed)
Pre visit review using our clinic review tool, if applicable. No additional management support is needed unless otherwise documented below in the visit note. 

## 2014-04-19 ENCOUNTER — Encounter: Payer: Self-pay | Admitting: Family Medicine

## 2014-04-19 NOTE — Progress Notes (Signed)
Patient ID: Justin [name], male   DOB: September 20, 1933, 80 y.o.   MRN: [id number] Justin [name] [id number] 1933/11/06 04/19/2014      Progress Note-Follow Up  Subjective  Chief Complaint  Chief Complaint  Patient presents with  . Follow-up    on lab results    HPI  Patient is a 80 year old male in today for routine medical care. He is here today with his wife to discuss his lab results. He denies any changes in bowel habits. He's not having any abdominal pain, bloody or tarry stool. He denies a change in appetite, nausea vomiting. No fevers or recent illness. No chest pain, palpitations or shortness of breath.  Past Medical History  Diagnosis Date  . CAD (coronary artery disease) prior stenting 1999   . Dyslipidemia   . HTN (hypertension)   . WPW (Wolff-Parkinson-White syndrome) loss of preexcitation   . Atrial fibrillation   . AV block, Mobitz II   . Presyncope   . Myocardial infarction   . Pacemaker 04/07/2013  . Sleep apnea     USES CPAP  . Arthritis   . History of chicken pox   . Hyperlipidemia   . Anemia   . Hypernatremia 06/03/2013  . Thrombocytopenia, unspecified 06/03/2013  . Leukopenia 06/03/2013  . Obstructive sleep apnea 06/03/2013  . Urinary incontinence 06/03/2013  . Atrial fibrillation   . Hyperglycemia 12/21/2013  . Pancytopenia 06/03/2013    Past Surgical History  Procedure Laterality Date  . Coronary angioplasty with stent placement    . Vastectomy    . Insert / replace / remove pacemaker  04/07/2013    Family History  Problem Relation Age of Onset  . Stroke Mother   . Hypertension Mother   . Stroke Sister   . Arthritis Sister     rheumatoid  . Heart attack Sister   . Anemia Sister   . Other Brother     tube put in aorta  . Anemia Brother   . Other Son     cortisone deficiency  . Arthritis Son   . Stroke Brother   . Alcohol abuse Brother   . Barrett's esophagus Son     History   Social History  . Marital Status: Married    Spouse Name: N/A   Number of Children: 3  . Years of Education: N/A   Occupational History  .      Retired   Social History Main Topics  . Smoking status: Former Smoker    Quit date: 12/05/1991  . Smokeless tobacco: Never Used  . Alcohol Use: Yes     Comment: Occasional  . Drug Use: No  . Sexual Activity: Not on file   Other Topics Concern  . Not on file   Social History Narrative  . No narrative on file    Current Outpatient Prescriptions on File Prior to Visit  Medication Sig Dispense Refill  . apixaban (ELIQUIS) 5 MG TABS tablet Take 1 tablet (5 mg total) by mouth 2 (two) times daily.  180 tablet  3  . atorvastatin (LIPITOR) 10 MG tablet Take 1 tablet (10 mg total) by mouth every evening.  90 tablet  3  . Azelaic Acid (FINACEA) 15 % cream Apply 1 application topically daily. After skin is thoroughly washed and patted dry, gently but thoroughly massage a thin film of azelaic acid creame      . Cholecalciferol (VITAMIN D-3) 1000 UNITS CAPS Take 1 capsule by mouth daily.      Marland Kitchen  fluticasone (FLONASE) 50 MCG/ACT nasal spray Place 1 spray into the nose 2 (two) times daily. For seasonal allergies      . losartan (COZAAR) 25 MG tablet Take 1 tablet (25 mg total) by mouth daily.  90 tablet  3  . Multiple Vitamin (MULTIVITAMIN WITH MINERALS) TABS Take 1 tablet by mouth 2 (two) times a week.       . nadolol (CORGARD) 20 MG tablet TAKE ONE HALF TABLET ONCE DAILY  90 tablet  4  . nitroGLYCERIN (NITROSTAT) 0.4 MG SL tablet Place 1 tablet (0.4 mg total) under the tongue every 5 (five) minutes as needed for chest pain. x3 doses as needed for chest pain  25 tablet  12  . Probiotic Product (PROBIOTIC DAILY PO) Take by mouth daily.      Marland Kitchen pyridOXINE (VITAMIN B-6) 100 MG tablet Take 100 mg by mouth every evening.      . tamsulosin (FLOMAX) 0.4 MG CAPS capsule Take 0.4 mg by mouth at bedtime.      . vitamin B-12 (CYANOCOBALAMIN) 1000 MCG tablet Take 1,000 mcg by mouth every evening.       No current  facility-administered medications on file prior to visit.    No Known Allergies  Review of Systems  Review of Systems  Constitutional: Negative for fever and malaise/fatigue.  HENT: Negative for congestion.   Eyes: Negative for discharge.  Respiratory: Negative for shortness of breath.   Cardiovascular: Negative for chest pain, palpitations and leg swelling.  Gastrointestinal: Negative for nausea, abdominal pain and diarrhea.  Genitourinary: Negative for dysuria.  Musculoskeletal: Negative for falls.  Skin: Negative for rash.  Neurological: Negative for loss of consciousness and headaches.  Endo/Heme/Allergies: Negative for polydipsia.  Psychiatric/Behavioral: Negative for depression and suicidal ideas. The patient is not nervous/anxious and does not have insomnia.     Objective  BP 148/54  Pulse 74  Temp(Src) 98.4 F (36.9 C) (Oral)  Ht 5\' 11"  (1.803 m)  Wt 183 lb 1.3 oz (83.045 kg)  BMI 25.55 kg/m2  SpO2 97%  Physical Exam  Physical Exam  Constitutional: He is oriented to person, place, and time and well-developed, well-nourished, and in no distress. No distress.  HENT:  Head: Normocephalic and atraumatic.  Eyes: Conjunctivae are normal.  Neck: Neck supple. No thyromegaly present.  Cardiovascular: Normal rate, regular rhythm and normal heart sounds.   No murmur heard. Pulmonary/Chest: Effort normal and breath sounds normal. No respiratory distress.  Abdominal: He exhibits no distension and no mass. There is no tenderness.  Musculoskeletal: He exhibits no edema.  Neurological: He is alert and oriented to person, place, and time.  Skin: Skin is warm.  Psychiatric: Memory, affect and judgment normal.    Lab Results  Component Value Date   TSH 1.311 04/14/2014   Lab Results  Component Value Date   WBC 5.8 04/14/2014   HGB 10.8* 04/14/2014   HCT 31.6* 04/14/2014   MCV 94.9 04/14/2014   PLT 97* 04/14/2014   Lab Results  Component Value Date   CREATININE 1.25  04/14/2014   BUN 23 04/14/2014   NA 139 04/14/2014   K 4.7 04/14/2014   CL 105 04/14/2014   CO2 25 04/14/2014   Lab Results  Component Value Date   ALT 19 01/13/2014   AST 28 01/13/2014   ALKPHOS 45 01/13/2014   BILITOT 0.8 01/13/2014   Lab Results  Component Value Date   CHOL 126 01/13/2014   Lab Results  Component Value Date  HDL 49 01/13/2014   Lab Results  Component Value Date   LDLCALC 64 01/13/2014   Lab Results  Component Value Date   TRIG 66 01/13/2014   Lab Results  Component Value Date   CHOLHDL 2.6 01/13/2014     Assessment & Plan  HTN (hypertension) Adequately controlled, no changes to meds. Encouraged heart healthy diet such as the DASH diet and exercise as tolerated.   Hyperlipidemia Encouraged heart healthy diet, increase exercise, avoid trans fats, consider a krill oil cap daily   Atrial fibrillation Tolerating Eliquis  Pancytopenia Check IFOB and he is referred to hematology for further consideration  Hyperglycemia A1C is 5.5. Minimize simple carbs

## 2014-04-19 NOTE — Assessment & Plan Note (Signed)
A1C is 5.5. Minimize simple carbs

## 2014-04-19 NOTE — Assessment & Plan Note (Signed)
Encouraged heart healthy diet, increase exercise, avoid trans fats, consider a krill oil cap daily 

## 2014-04-19 NOTE — Assessment & Plan Note (Signed)
Sent home with IFOB to complete.  Repeat CBC

## 2014-04-19 NOTE — Assessment & Plan Note (Signed)
Tolerating statin, encouraged heart healthy diet, avoid trans fats, minimize simple carbs and saturated fats. Increase exercise as tolerated 

## 2014-04-19 NOTE — Assessment & Plan Note (Signed)
Tolerating Eliquis 

## 2014-04-19 NOTE — Assessment & Plan Note (Signed)
Check IFOB and he is referred to hematology for further consideration

## 2014-04-19 NOTE — Assessment & Plan Note (Signed)
A1C 5.5. Minimize simple carbs

## 2014-04-19 NOTE — Assessment & Plan Note (Signed)
Well controlled, no changes to meds. Encouraged heart healthy diet such as the DASH diet and exercise as tolerated.  °

## 2014-04-19 NOTE — Assessment & Plan Note (Signed)
Adequately controlled, no changes to meds. Encouraged heart healthy diet such as the DASH diet and exercise as tolerated.

## 2014-04-19 NOTE — Progress Notes (Signed)
Patient ID: Justin [name], male   DOB: 1933/03/01, 80 y.o.   MRN: [id number] Justin [name] [id number] Dec 18, 1932 04/19/2014      Progress Note-Follow Up  Subjective  Chief Complaint  Chief Complaint  Patient presents with  . Follow-up  . Injections    pneumovax    HPI  Patient is a 80 year old male in today for routine medical care. In today with his wife complaining of increased fatigue and coldness.  it is worse in the p.m. and others are not cold. He has lost roughly 10 pounds but has been trying to eat less and eat better. Secondary to his blood pressure and diabetes. His only other complaint is of stiffness in his joints especially the right thumb  Past Medical History  Diagnosis Date  . CAD (coronary artery disease) prior stenting 1999   . Dyslipidemia   . HTN (hypertension)   . WPW (Wolff-Parkinson-White syndrome) loss of preexcitation   . Atrial fibrillation   . AV block, Mobitz II   . Presyncope   . Myocardial infarction   . Pacemaker 04/07/2013  . Sleep apnea     USES CPAP  . Arthritis   . History of chicken pox   . Hyperlipidemia   . Anemia   . Hypernatremia 06/03/2013  . Thrombocytopenia, unspecified 06/03/2013  . Leukopenia 06/03/2013  . Obstructive sleep apnea 06/03/2013  . Urinary incontinence 06/03/2013  . Atrial fibrillation   . Hyperglycemia 12/21/2013    Past Surgical History  Procedure Laterality Date  . Coronary angioplasty with stent placement    . Vastectomy    . Insert / replace / remove pacemaker  04/07/2013    Family History  Problem Relation Age of Onset  . Stroke Mother   . Hypertension Mother   . Stroke Sister   . Arthritis Sister     rheumatoid  . Heart attack Sister   . Anemia Sister   . Other Brother     tube put in aorta  . Anemia Brother   . Other Son     cortisone deficiency  . Arthritis Son   . Stroke Brother   . Alcohol abuse Brother   . Barrett's esophagus Son     History   Social History  . Marital Status: Married    Spouse Name: N/A    Number of Children: 3  . Years of Education: N/A   Occupational History  .      Retired   Social History Main Topics  . Smoking status: Former Smoker    Quit date: 12/05/1991  . Smokeless tobacco: Never Used  . Alcohol Use: Yes     Comment: Occasional  . Drug Use: No  . Sexual Activity: Not on file   Other Topics Concern  . Not on file   Social History Narrative  . No narrative on file    Current Outpatient Prescriptions on File Prior to Visit  Medication Sig Dispense Refill  . apixaban (ELIQUIS) 5 MG TABS tablet Take 1 tablet (5 mg total) by mouth 2 (two) times daily.  180 tablet  3  . atorvastatin (LIPITOR) 10 MG tablet Take 1 tablet (10 mg total) by mouth every evening.  90 tablet  3  . Azelaic Acid (FINACEA) 15 % cream Apply 1 application topically daily. After skin is thoroughly washed and patted dry, gently but thoroughly massage a thin film of azelaic acid creame      . Cholecalciferol (VITAMIN D-3) 1000 UNITS CAPS  Take 1 capsule by mouth daily.      . fluticasone (FLONASE) 50 MCG/ACT nasal spray Place 1 spray into the nose 2 (two) times daily. For seasonal allergies      . losartan (COZAAR) 25 MG tablet Take 1 tablet (25 mg total) by mouth daily.  90 tablet  3  . Multiple Vitamin (MULTIVITAMIN WITH MINERALS) TABS Take 1 tablet by mouth 2 (two) times a week.       . nadolol (CORGARD) 20 MG tablet TAKE ONE HALF TABLET ONCE DAILY  90 tablet  4  . nitroGLYCERIN (NITROSTAT) 0.4 MG SL tablet Place 1 tablet (0.4 mg total) under the tongue every 5 (five) minutes as needed for chest pain. x3 doses as needed for chest pain  25 tablet  12  . Probiotic Product (PROBIOTIC DAILY PO) Take by mouth daily.      Marland Kitchen pyridOXINE (VITAMIN B-6) 100 MG tablet Take 100 mg by mouth every evening.      . tamsulosin (FLOMAX) 0.4 MG CAPS capsule Take 0.4 mg by mouth at bedtime.      . vitamin B-12 (CYANOCOBALAMIN) 1000 MCG tablet Take 1,000 mcg by mouth every evening.       No  current facility-administered medications on file prior to visit.    No Known Allergies  Review of Systems  Review of Systems  Constitutional: Positive for malaise/fatigue. Negative for fever and chills.  HENT: Negative for congestion, hearing loss and nosebleeds.   Eyes: Negative for discharge.  Respiratory: Negative for cough, sputum production, shortness of breath and wheezing.   Cardiovascular: Negative for chest pain, palpitations and leg swelling.  Gastrointestinal: Negative for heartburn, nausea, vomiting, abdominal pain, diarrhea, constipation and blood in stool.  Genitourinary: Negative for dysuria, urgency, frequency and hematuria.  Musculoskeletal: Positive for joint pain. Negative for back pain, falls and myalgias.  Skin: Negative for rash.  Neurological: Negative for dizziness, tremors, sensory change, focal weakness, loss of consciousness, weakness and headaches.  Endo/Heme/Allergies: Negative for polydipsia. Does not bruise/bleed easily.  Psychiatric/Behavioral: Negative for depression and suicidal ideas. The patient is not nervous/anxious and does not have insomnia.     Objective  BP 128/62  Pulse 76  Temp(Src) 98.2 F (36.8 C) (Oral)  Ht 5\' 11"  (1.803 m)  Wt 183 lb 1.9 oz (83.063 kg)  BMI 25.55 kg/m2  SpO2 97%  Physical Exam  Physical Exam  Constitutional: He is oriented to person, place, and time and well-developed, well-nourished, and in no distress. No distress.  HENT:  Head: Normocephalic and atraumatic.  Eyes: Conjunctivae are normal.  Neck: Neck supple. No thyromegaly present.  Cardiovascular: Normal rate, regular rhythm and normal heart sounds.   Pulmonary/Chest: Effort normal and breath sounds normal. No respiratory distress.  Abdominal: He exhibits no distension and no mass. There is no tenderness.  Musculoskeletal: He exhibits no edema.  Neurological: He is alert and oriented to person, place, and time.  Skin: Skin is warm.  Psychiatric:  Memory, affect and judgment normal.    Lab Results  Component Value Date   TSH 1.311 04/14/2014   Lab Results  Component Value Date   WBC 5.8 04/14/2014   HGB 10.8* 04/14/2014   HCT 31.6* 04/14/2014   MCV 94.9 04/14/2014   PLT 97* 04/14/2014   Lab Results  Component Value Date   CREATININE 1.25 04/14/2014   BUN 23 04/14/2014   NA 139 04/14/2014   K 4.7 04/14/2014   CL 105 04/14/2014   CO2 25 04/14/2014  Lab Results  Component Value Date   ALT 19 01/13/2014   AST 28 01/13/2014   ALKPHOS 45 01/13/2014   BILITOT 0.8 01/13/2014   Lab Results  Component Value Date   CHOL 126 01/13/2014   Lab Results  Component Value Date   HDL 49 01/13/2014   Lab Results  Component Value Date   LDLCALC 64 01/13/2014   Lab Results  Component Value Date   TRIG 66 01/13/2014   Lab Results  Component Value Date   CHOLHDL 2.6 01/13/2014     Assessment & Plan  HTN (hypertension) Well controlled, no changes to meds. Encouraged heart healthy diet such as the DASH diet and exercise as tolerated.   Hyperlipidemia Tolerating statin, encouraged heart healthy diet, avoid trans fats, minimize simple carbs and saturated fats. Increase exercise as tolerated  Hyperglycemia A1C 5.5. Minimize simple carbs  Anemia Sent home with IFOB to complete.  Repeat CBC

## 2014-04-21 ENCOUNTER — Encounter: Payer: Medicare Other | Admitting: Cardiology

## 2014-04-24 ENCOUNTER — Telehealth: Payer: Self-pay | Admitting: Family Medicine

## 2014-04-24 DIAGNOSIS — D649 Anemia, unspecified: Secondary | ICD-10-CM

## 2014-04-24 NOTE — Telephone Encounter (Signed)
He had labs drawn on 04-17-2014 and would like the results

## 2014-04-28 ENCOUNTER — Other Ambulatory Visit (INDEPENDENT_AMBULATORY_CARE_PROVIDER_SITE_OTHER): Payer: Medicare Other

## 2014-04-28 DIAGNOSIS — D649 Anemia, unspecified: Secondary | ICD-10-CM

## 2014-04-28 LAB — FECAL OCCULT BLOOD, IMMUNOCHEMICAL: Fecal Occult Bld: POSITIVE — AB

## 2014-04-28 MED ORDER — FERROUS FUMARATE-FOLIC ACID 324-1 MG PO TABS: 1.0000 | ORAL_TABLET | Freq: Every day | ORAL | Status: DC

## 2014-04-28 MED ORDER — OMEPRAZOLE 20 MG PO CPDR: 20.0000 mg | DELAYED_RELEASE_CAPSULE | Freq: Every day | ORAL | Status: DC

## 2014-04-28 NOTE — Telephone Encounter (Signed)
Left a detailed message on patients home answering machine. Sent both RX's and ordered lab work. Asked patient to let us know about GI

## 2014-04-28 NOTE — Telephone Encounter (Signed)
I left a message for patient to return my call.  I don't see where the labs have been drawn? Will send a note to Katrina w/[facility]

## 2014-04-28 NOTE — Telephone Encounter (Signed)
More anemic, needs to start Hemocyte F 1 tab daily repeat CBC in next week, has referred to hematology and if he does not have a gastroenterologist I can set up referral for heme positive and anemia

## 2014-04-28 NOTE — Telephone Encounter (Signed)
Please advise? Lab results put on md's desk

## 2014-04-28 NOTE — Telephone Encounter (Signed)
Also start Omeprazole 20 mg daily, disp 30 with 3 refill

## 2014-04-29 NOTE — Telephone Encounter (Signed)
04/17/14 results still not crossing into EPIC. Received hard copy and forwarded to Provider for recommendations. Pt would also like copy. Please advise.

## 2014-04-29 NOTE — Telephone Encounter (Signed)
Please follow up and make sure I get these labs

## 2014-04-29 NOTE — Telephone Encounter (Signed)
I cannot see the 5/15 labs

## 2014-04-29 NOTE — Telephone Encounter (Signed)
Advised pt of below recommendations. He requested copy of results and I advised him that I released them to mychart. Upon further discussion I see that results from 04/17/14 have not crossed over into EPIC. Advised pt I would call the lab and we would get back with him re: additional results. Spoke with Maudie Mercury at [facility] and asked her to re-send and fax hard copy of 04/17/14 results. Awaiting results.

## 2014-04-30 ENCOUNTER — Other Ambulatory Visit: Payer: Self-pay | Admitting: Family Medicine

## 2014-04-30 ENCOUNTER — Telehealth: Payer: Self-pay | Admitting: Family Medicine

## 2014-04-30 ENCOUNTER — Encounter: Payer: Medicare Other | Admitting: Cardiology

## 2014-04-30 DIAGNOSIS — Z79899 Other long term (current) drug therapy: Secondary | ICD-10-CM

## 2014-04-30 DIAGNOSIS — D649 Anemia, unspecified: Secondary | ICD-10-CM

## 2014-04-30 DIAGNOSIS — R195 Other fecal abnormalities: Secondary | ICD-10-CM

## 2014-04-30 NOTE — Telephone Encounter (Signed)
Requesting referral to Gastroenterology

## 2014-04-30 NOTE — Telephone Encounter (Signed)
done

## 2014-05-01 ENCOUNTER — Telehealth: Payer: Self-pay | Admitting: Family Medicine

## 2014-05-01 NOTE — Telephone Encounter (Signed)
This is already been done and patient informed

## 2014-05-01 NOTE — Telephone Encounter (Signed)
Patient was told by Alyse Low that she mailed his lab results  He has not received them and wants to pick up a copy at the office today.

## 2014-05-01 NOTE — Telephone Encounter (Signed)
The results are not crossing over into EPIC so [facility] faxed [place] a copy on Wednesday and I placed the hard copy in Provider's tray on her desk with a sticky note to see phone note re: labs. Alyse Low, can you follow up on this?

## 2014-05-01 NOTE — Telephone Encounter (Signed)
I informed him that it would take a few days before he got this. And that the other copy had been sent to be scanned into his records at another location. I informed him that I didn't have a copy of his records in front of me until they get this scanned in.

## 2014-05-05 ENCOUNTER — Ambulatory Visit (INDEPENDENT_AMBULATORY_CARE_PROVIDER_SITE_OTHER): Payer: Medicare Other | Admitting: Gastroenterology

## 2014-05-05 ENCOUNTER — Encounter: Payer: Self-pay | Admitting: Internal Medicine

## 2014-05-05 ENCOUNTER — Ambulatory Visit (INDEPENDENT_AMBULATORY_CARE_PROVIDER_SITE_OTHER): Payer: Medicare Other | Admitting: Internal Medicine

## 2014-05-05 ENCOUNTER — Encounter: Payer: Self-pay | Admitting: Gastroenterology

## 2014-05-05 VITALS — BP 136/74 | HR 87 | Ht 71.0 in | Wt 190.0 lb

## 2014-05-05 VITALS — BP 124/70 | HR 66 | Ht 71.0 in | Wt 188.8 lb

## 2014-05-05 DIAGNOSIS — I2510 Atherosclerotic heart disease of native coronary artery without angina pectoris: Secondary | ICD-10-CM

## 2014-05-05 DIAGNOSIS — R195 Other fecal abnormalities: Secondary | ICD-10-CM

## 2014-05-05 DIAGNOSIS — D649 Anemia, unspecified: Secondary | ICD-10-CM

## 2014-05-05 DIAGNOSIS — I441 Atrioventricular block, second degree: Secondary | ICD-10-CM

## 2014-05-05 LAB — MDC_IDC_ENUM_SESS_TYPE_INCLINIC
Battery Impedance: 100 Ohm
Battery Remaining Longevity: 138 mo
Battery Voltage: 2.8 V
Brady Statistic AP VP Percent: 71 %
Brady Statistic AP VS Percent: 0 %
Brady Statistic AS VP Percent: 29 %
Brady Statistic AS VS Percent: 0 %
Date Time Interrogation Session: 20150602161537
Lead Channel Impedance Value: 498 Ohm
Lead Channel Impedance Value: 516 Ohm
Lead Channel Pacing Threshold Amplitude: 0.5 V
Lead Channel Pacing Threshold Amplitude: 0.75 V
Lead Channel Pacing Threshold Pulse Width: 0.4 ms
Lead Channel Pacing Threshold Pulse Width: 0.4 ms
Lead Channel Sensing Intrinsic Amplitude: 11.2 mV
Lead Channel Sensing Intrinsic Amplitude: 4 mV
Lead Channel Setting Pacing Amplitude: 2 V
Lead Channel Setting Pacing Amplitude: 2.5 V
Lead Channel Setting Pacing Pulse Width: 0.4 ms
Lead Channel Setting Sensing Sensitivity: 2.8 mV

## 2014-05-05 MED ORDER — MOVIPREP 100 G PO SOLR: 1.0000 | Freq: Once | ORAL | Status: DC

## 2014-05-05 NOTE — Progress Notes (Signed)
HPI: This is a   very pleasant 80 year old man whom I am meeting for the first time today.   Hb 10.8, normal MCV.  Follow up stool testing was positive.  He does not see blood in his stool.  Hb 1 year ago 13.  Normal renal function.   plts a bit low.   Maybe a bit grey in colo (stool).  Lost 10 pounds intentionally, some back on.  Had colonoscopy, most recent in [place] (8-9 years ago).  He believes it was for routine screening.  Does not recall any polyps.  No FH of colon cancer or polyps.  Rarely NSAIDs.  No serious abd pians.  No overt hematemesis.  Has been on eliquis for about a year, for a fib.  Never big etoh drinker.  Review of systems: Pertinent positive and negative review of systems were noted in the above HPI section. Complete review of systems was performed and was otherwise normal.    Past Medical History  Diagnosis Date  . CAD (coronary artery disease) prior stenting 1999   . Dyslipidemia   . HTN (hypertension)   . WPW (Wolff-Parkinson-White syndrome) loss of preexcitation   . Atrial fibrillation   . AV block, Mobitz II   . Presyncope   . Myocardial infarction   . Pacemaker 04/07/2013  . Sleep apnea     USES CPAP  . Arthritis   . History of chicken pox   . Hyperlipidemia   . Anemia   . Hypernatremia 06/03/2013  . Thrombocytopenia, unspecified 06/03/2013  . Leukopenia 06/03/2013  . Obstructive sleep apnea 06/03/2013  . Urinary incontinence 06/03/2013  . Atrial fibrillation   . Hyperglycemia 12/21/2013  . Pancytopenia 06/03/2013    Past Surgical History  Procedure Laterality Date  . Coronary angioplasty with stent placement    . Vastectomy    . Insert / replace / remove pacemaker  04/07/2013    Current Outpatient Prescriptions  Medication Sig Dispense Refill  . apixaban (ELIQUIS) 5 MG TABS tablet Take 1 tablet (5 mg total) by mouth 2 (two) times daily.  180 tablet  3  . atorvastatin (LIPITOR) 10 MG tablet Take 1 tablet (10 mg total) by mouth every  evening.  90 tablet  3  . Azelaic Acid (FINACEA) 15 % cream Apply 1 application topically daily. After skin is thoroughly washed and patted dry, gently but thoroughly massage a thin film of azelaic acid creame      . Cholecalciferol (VITAMIN D-3) 1000 UNITS CAPS Take 1 capsule by mouth daily.      . Ferrous Fumarate-Folic Acid (HEMOCYTE-F) 324-1 MG TABS Take 1 tablet by mouth daily.  30 each  3  . fluticasone (FLONASE) 50 MCG/ACT nasal spray Place 1 spray into the nose 2 (two) times daily. For seasonal allergies      . losartan (COZAAR) 25 MG tablet Take 1 tablet (25 mg total) by mouth daily.  90 tablet  3  . Multiple Vitamin (MULTIVITAMIN WITH MINERALS) TABS Take 1 tablet by mouth 2 (two) times a week.       . nadolol (CORGARD) 20 MG tablet TAKE ONE HALF TABLET ONCE DAILY  90 tablet  4  . nitroGLYCERIN (NITROSTAT) 0.4 MG SL tablet Place 1 tablet (0.4 mg total) under the tongue every 5 (five) minutes as needed for chest pain. x3 doses as needed for chest pain  25 tablet  12  . omeprazole (PRILOSEC) 20 MG capsule Take 1 capsule (20 mg total) by mouth daily.  30 capsule  3  . Probiotic Product (PROBIOTIC DAILY PO) Take by mouth daily.      Marland Kitchen pyridOXINE (VITAMIN B-6) 100 MG tablet Take 100 mg by mouth every evening.      . tamsulosin (FLOMAX) 0.4 MG CAPS capsule Take 0.4 mg by mouth at bedtime.      . vitamin B-12 (CYANOCOBALAMIN) 1000 MCG tablet Take 1,000 mcg by mouth every evening.       No current facility-administered medications for this visit.    Allergies as of 05/05/2014  . (No Known Allergies)    Family History  Problem Relation Age of Onset  . Stroke Mother   . Hypertension Mother   . Stroke Sister   . Arthritis Sister     rheumatoid  . Heart attack Sister   . Anemia Sister   . Other Brother     tube put in aorta  . Anemia Brother   . Other Son     cortisone deficiency  . Arthritis Son   . Stroke Brother   . Alcohol abuse Brother   . Barrett's esophagus Son      History   Social History  . Marital Status: Married    Spouse Name: N/A    Number of Children: 3  . Years of Education: N/A   Occupational History  .      Retired   Social History Main Topics  . Smoking status: Former Smoker    Quit date: 12/05/1991  . Smokeless tobacco: Never Used  . Alcohol Use: Yes     Comment: Occasional  . Drug Use: No  . Sexual Activity: Not on file   Other Topics Concern  . Not on file   Social History Narrative  . No narrative on file       Physical Exam: BP 124/70  Pulse 66  Ht 5\' 11"  (1.803 m)  Wt 188 lb 12.8 oz (85.639 kg)  BMI 26.34 kg/m2 Constitutional: generally well-appearing Psychiatric: alert and oriented x3 Eyes: extraocular movements intact Mouth: oral pharynx moist, no lesions Neck: supple no lymphadenopathy Cardiovascular: heart regular rate and rhythm Lungs: clear to auscultation bilaterally Abdomen: soft, nontender, nondistended, no obvious ascites, no peritoneal signs, normal bowel sounds Extremities: no lower extremity edema bilaterally Skin: no lesions on visible extremities    Assessment and plan: 80 y.o. male with  anemia, Hemoccult positive stool  I did not mention above but he does have intermittent left lower quadrant pains that are usually improved when he moves his bowels. I would like to proceed with colonoscopy plus minus EGD if nothing is found on the colonoscopy. He understands that he is on a blood thinner and that increases his risk for procedure complications. We will communicate with his cardiologist about holding the blood thinner for 2 days prior colonoscopy, upper endoscopy. I see no reason for any further blood tests or imaging studies prior to then.

## 2014-05-05 NOTE — Patient Instructions (Signed)
You will be set up for a colonoscopy +/- EGD for hemocult positive anemia. We will contact Dr. Stanford Breed about the safety of holding eliquis for 2 days prior to those tests.

## 2014-05-05 NOTE — Progress Notes (Signed)
Patient Care Team: Mosie Lukes, MD as PCP - General (Family Medicine) Lelon Perla, MD as Attending Physician (Cardiology)   HPI  Justin [name] is a 80 y.o. male Seen in followup for pacemaker implantation for atrial fibrillation presyncope and documented 2: 1 heart block. He underwent pacemaker implantation 5/14.   He has had no recurrent presyncope. He has noted some exercise intolerance and noted that his heart rate with peak at 65-70 following exertion   He has had no recurrent syncope.  He has intercurrently been diagnosed with guaiac positive stool and anemia. He is being seen by Dr. Ardis Hughs He is scheduled to undergo colonoscopy  He has a history of coronary artery disease with prior LAD infarct and PCI April 1999. Echocardiogram June 2000 4T demonstrated normal LV function, moderate MR and mild LAE    Past Medical History  Diagnosis Date  . CAD (coronary artery disease) prior stenting 1999   . Dyslipidemia   . HTN (hypertension)   . WPW (Wolff-Parkinson-White syndrome) loss of preexcitation   . Atrial fibrillation   . AV block, Mobitz II   . Presyncope   . Myocardial infarction   . Pacemaker 04/07/2013  . Sleep apnea     USES CPAP  . Arthritis   . History of chicken pox   . Hyperlipidemia   . Anemia   . Hypernatremia 06/03/2013  . Thrombocytopenia, unspecified 06/03/2013  . Leukopenia 06/03/2013  . Obstructive sleep apnea 06/03/2013  . Urinary incontinence 06/03/2013  . Atrial fibrillation   . Hyperglycemia 12/21/2013  . Pancytopenia 06/03/2013    Past Surgical History  Procedure Laterality Date  . Coronary angioplasty with stent placement    . Vastectomy    . Insert / replace / remove pacemaker  04/07/2013    Current Outpatient Prescriptions  Medication Sig Dispense Refill  . apixaban (ELIQUIS) 5 MG TABS tablet Take 1 tablet (5 mg total) by mouth 2 (two) times daily.  180 tablet  3  . atorvastatin (LIPITOR) 10 MG tablet Take 1 tablet (10 mg total) by  mouth every evening.  90 tablet  3  . Azelaic Acid (FINACEA) 15 % cream Apply 1 application topically daily. After skin is thoroughly washed and patted dry, gently but thoroughly massage a thin film of azelaic acid creame      . Cholecalciferol (VITAMIN D-3) 1000 UNITS CAPS Take 1 capsule by mouth daily.      . Ferrous Fumarate-Folic Acid (HEMOCYTE-F) 324-1 MG TABS Take 1 tablet by mouth daily.  30 each  3  . fluticasone (FLONASE) 50 MCG/ACT nasal spray Place 1 spray into the nose 2 (two) times daily. For seasonal allergies      . losartan (COZAAR) 25 MG tablet Take 1 tablet (25 mg total) by mouth daily.  90 tablet  3  . MOVIPREP 100 G SOLR Take 1 kit (200 g total) by mouth once.  1 kit  0  . Multiple Vitamin (MULTIVITAMIN WITH MINERALS) TABS Take 1 tablet by mouth 2 (two) times a week.       . nadolol (CORGARD) 20 MG tablet TAKE ONE HALF TABLET ONCE DAILY  90 tablet  4  . nitroGLYCERIN (NITROSTAT) 0.4 MG SL tablet Place 1 tablet (0.4 mg total) under the tongue every 5 (five) minutes as needed for chest pain. x3 doses as needed for chest pain  25 tablet  12  . omeprazole (PRILOSEC) 20 MG capsule Take 1 capsule (20 mg total)  by mouth daily.  30 capsule  3  . Probiotic Product (PROBIOTIC DAILY PO) Take by mouth daily.      Marland Kitchen pyridOXINE (VITAMIN B-6) 100 MG tablet Take 100 mg by mouth every evening.      . tamsulosin (FLOMAX) 0.4 MG CAPS capsule Take 0.4 mg by mouth at bedtime.      . vitamin B-12 (CYANOCOBALAMIN) 1000 MCG tablet Take 1,000 mcg by mouth every evening.       No current facility-administered medications for this visit.    No Known Allergies  Review of Systems negative except from HPI and PMH  Physical Exam BP 136/74  Pulse 87  Ht $R'5\' 11"'gb$  (1.803 m)  Wt 190 lb (86.183 kg)  BMI 26.51 kg/m2 Well developed and well nourished in no acute distress HENT normal E scleral and icterus clear Neck Supple JVP flat; carotids brisk and full Clear to ausculation  Regular rate and rhythm,  no murmurs gallops or rub Soft with active bowel sounds No clubbing cyanosis  Edema Alert and oriented, grossly normal motor and sensory function Skin Warm and Dry    Assessment and  Plan  High Grade Heart Block  PPacemaker Medtronic The patient's device was interrogated.  The information was reviewed. No changes were made in the programming.    CAD prior stenting  Sinus node dysfunction  Hypertension   Anemia/Guaic Positive Stool  And is scheduled to undergo colonoscopy. His apixaban can be stopped 48 hours prior the procedure. His wife particularly is concerned about the implications of this study.  Well controlled HTN  No angina

## 2014-05-05 NOTE — Patient Instructions (Signed)

## 2014-05-07 ENCOUNTER — Encounter: Payer: Self-pay | Admitting: Internal Medicine

## 2014-05-08 ENCOUNTER — Telehealth: Payer: Self-pay

## 2014-05-08 ENCOUNTER — Telehealth: Payer: Self-pay | Admitting: Gastroenterology

## 2014-05-08 NOTE — Telephone Encounter (Signed)
Response received in a office note per Dr Lequita Halt 05/05/14 pt is aware

## 2014-05-08 NOTE — Telephone Encounter (Signed)
I have placed a call to Dr Stanford Breed regarding the Eloquis.  The pt is aware that I will call him as soon as a recommendation is received  I researched the note per Dr Lequita Halt and the hold has been ok'd    His apixaban can be stopped 48 hours prior the procedure. (see 05/05/14 note)   Pt is aware

## 2014-05-08 NOTE — Telephone Encounter (Signed)
Message copied by Barron Alvine on Fri May 08, 2014  8:59 AM ------      Message from: Barron Alvine      Created: Tue May 05, 2014 10:00 AM       Dr Stanford Breed anti coag ------

## 2014-05-11 ENCOUNTER — Encounter: Payer: Self-pay | Admitting: Family Medicine

## 2014-05-13 ENCOUNTER — Encounter: Payer: Self-pay | Admitting: Internal Medicine

## 2014-05-13 ENCOUNTER — Ambulatory Visit (AMBULATORY_SURGERY_CENTER): Payer: Medicare Other | Admitting: Gastroenterology

## 2014-05-13 ENCOUNTER — Encounter: Payer: Self-pay | Admitting: Gastroenterology

## 2014-05-13 VITALS — BP 128/56 | HR 61 | Temp 98.0°F | Resp 18 | Ht 71.0 in | Wt 188.0 lb

## 2014-05-13 DIAGNOSIS — K449 Diaphragmatic hernia without obstruction or gangrene: Secondary | ICD-10-CM

## 2014-05-13 DIAGNOSIS — D126 Benign neoplasm of colon, unspecified: Secondary | ICD-10-CM

## 2014-05-13 DIAGNOSIS — R195 Other fecal abnormalities: Secondary | ICD-10-CM

## 2014-05-13 DIAGNOSIS — K644 Residual hemorrhoidal skin tags: Secondary | ICD-10-CM

## 2014-05-13 DIAGNOSIS — D649 Anemia, unspecified: Secondary | ICD-10-CM

## 2014-05-13 DIAGNOSIS — K648 Other hemorrhoids: Secondary | ICD-10-CM

## 2014-05-13 MED ORDER — SODIUM CHLORIDE 0.9 % IV SOLN: 500.0000 mL | INTRAVENOUS | Status: DC

## 2014-05-13 NOTE — Op Note (Signed)
[facility]  [address] [place], [id number]   ENDOSCOPY PROCEDURE REPORT  PATIENT: Braxen, Dobek  MR#: [id number] BIRTHDATE: Jun 05, 1933 , 81  yrs. old GENDER: Male ENDOSCOPIST: Milus Banister, MD PROCEDURE DATE:  05/13/2014 PROCEDURE:  EGD, diagnostic ASA CLASS:     Class III INDICATIONS:  hemocult positive anemia, on blood thinners. MEDICATIONS: MAC sedation, administered by CRNA and propofol (Diprivan) 100mg  IV TOPICAL ANESTHETIC: none  DESCRIPTION OF PROCEDURE: After the risks benefits and alternatives of the procedure were thoroughly explained, informed consent was obtained.  The LB [id number] endoscope was introduced through the mouth and advanced to the second portion of the duodenum. Without limitations.  The instrument was slowly withdrawn as the mucosa was fully examined.    There was a 4cm hiatal hernia with sings of Cameron's type erosion, irritation.  The examination was otherwise normal.  Retroflexed views revealed no abnormalities.     The scope was then withdrawn from the patient and the procedure completed. COMPLICATIONS: There were no complications.  ENDOSCOPIC IMPRESSION: There was a 4cm hiatal hernia with sings of Cameron's type erosion, irritation. The examination was otherwise normal.  RECOMMENDATIONS: The findings on today's colonoscopy and EGD do not clearly explain your anemia.  It is ok for you to restart eloquis today.  My office will arrange repeat CBC in 1 month and if your anemia is worsening then will likely plan on further GI testing (such as SB capsule endoscopy).   eSigned:  Milus Banister, MD 05/13/2014 9:05 AM   CC: Penni Homans, MD

## 2014-05-13 NOTE — Progress Notes (Signed)
Called to room to assist during endoscopic procedure.  Patient ID and intended procedure confirmed with present staff. Received instructions for my participation in the procedure from the performing physician.  

## 2014-05-13 NOTE — Op Note (Signed)
[place]  [address] [place], [id number]   COLONOSCOPY PROCEDURE REPORT  PATIENT: Justin [name], Justin [name]  MR#: [id number] BIRTHDATE: Aug 26, 1933 , 81  yrs. old GENDER: Male ENDOSCOPIST: Milus Banister, MD REFERRED XB:JYNWG Charlett Blake, M.D. PROCEDURE DATE:  05/13/2014 PROCEDURE:   Colonoscopy with snare polypectomy First Screening Colonoscopy - Avg.  risk and is 50 yrs.  old or older - No.  Prior Negative Screening - Now for repeat screening. N/A  History of Adenoma - Now for follow-up colonoscopy & has been > or = to 3 yrs.  N/A  Polyps Removed Today? Yes. ASA CLASS:   Class III INDICATIONS:hemocult positive, anemia. MEDICATIONS: MAC sedation, administered by CRNA and propofol (Diprivan) 200mg  IV  DESCRIPTION OF PROCEDURE:   After the risks benefits and alternatives of the procedure were thoroughly explained, informed consent was obtained.  A digital rectal exam revealed no abnormalities of the rectum.   The LB [id number]  endoscope was introduced through the anus and advanced to the cecum, which was identified by both the appendix and ileocecal valve. No adverse events experienced.   The quality of the prep was good.  The instrument was then slowly withdrawn as the colon was fully examined.   COLON FINDINGS: Two polyps were found, removed and sent to pathology.  These were both sessile, 2-42mm across, located in cecum and sigmoid, removed with cold snare.  There were medium sized internal/external hemorrhoids.  Retroflexed views revealed no abnormalities. The time to cecum=4 minutes 55 seconds.  Withdrawal time=9 minutes 48 seconds.  The scope was withdrawn and the procedure completed. COMPLICATIONS: There were no complications.  ENDOSCOPIC IMPRESSION: Two polyps were found, removed and sent to pathology. There were medium sized internal/external hemorrhoids. These findings probably do NOT explain his anemia and so will proceed with EGD  now.  RECOMMENDATIONS: You will receive a letter within 1-2 weeks with the results of your biopsy as well as final recommendations.  Please call my office if you have not received a letter after 3 weeks.   eSigned:  Milus Banister, MD 05/13/2014 8:53 AM

## 2014-05-13 NOTE — Patient Instructions (Signed)
YOU HAD AN ENDOSCOPIC PROCEDURE TODAY AT THE [facility]: Refer to the procedure report that was given to you for any specific questions about what was found during the examination.  If the procedure report does not answer your questions, please call your gastroenterologist to clarify.  If you requested that your care partner not be given the details of your procedure findings, then the procedure report has been included in a sealed envelope for you to review at your convenience later.  YOU SHOULD EXPECT: Some feelings of bloating in the abdomen. Passage of more gas than usual.  Walking can help get rid of the air that was put into your GI tract during the procedure and reduce the bloating. If you had a lower endoscopy (such as a colonoscopy or flexible sigmoidoscopy) you may notice spotting of blood in your stool or on the toilet paper. If you underwent a bowel prep for your procedure, then you may not have a normal bowel movement for a few days.  DIET: Your first meal following the procedure should be a light meal and then it is ok to progress to your normal diet.  A half-sandwich or bowl of soup is an example of a good first meal.  Heavy or fried foods are harder to digest and may make you feel nauseous or bloated.  Likewise meals heavy in dairy and vegetables can cause extra gas to form and this can also increase the bloating.  Drink plenty of fluids but you should avoid alcoholic beverages for 24 hours.  ACTIVITY: Your care partner should take you home directly after the procedure.  You should plan to take it easy, moving slowly for the rest of the day.  You can resume normal activity the day after the procedure however you should NOT DRIVE or use heavy machinery for 24 hours (because of the sedation medicines used during the test).    SYMPTOMS TO REPORT IMMEDIATELY: A gastroenterologist can be reached at any hour.  During normal business hours, 8:30 AM to 5:00 PM Monday through Friday,  call [phone].  After hours and on weekends, please call the GI answering service at [phone] who will take a message and have the physician on call contact you.   Following lower endoscopy (colonoscopy or flexible sigmoidoscopy):  Excessive amounts of blood in the stool  Significant tenderness or worsening of abdominal pains  Swelling of the abdomen that is new, acute  Fever of 100F or higher  Following upper endoscopy (EGD)  Vomiting of blood or coffee ground material  New chest pain or pain under the shoulder blades  Painful or persistently difficult swallowing  New shortness of breath  Fever of 100F or higher  Black, tarry-looking stools  FOLLOW UP: If any biopsies were taken you will be contacted by phone or by letter within the next 1-3 weeks.  Call your gastroenterologist if you have not heard about the biopsies in 3 weeks.  Our staff will call the home number listed on your records the next business day following your procedure to check on you and address any questions or concerns that you may have at that time regarding the information given to you following your procedure. This is a courtesy call and so if there is no answer at the home number and we have not heard from you through the emergency physician on call, we will assume that you have returned to your regular daily activities without incident.  SIGNATURES/CONFIDENTIALITY: You and/or your care   partner have signed paperwork which will be entered into your electronic medical record.  These signatures attest to the fact that that the information above on your After Visit Summary has been reviewed and is understood.  Full responsibility of the confidentiality of this discharge information lies with you and/or your care-partner.  Polyps, hemorrhoids, hiatal hernia-handouts given  Restart Eloquis today 05/13/14.  Repeat labs in 1 month, office will set this up for you.

## 2014-05-14 ENCOUNTER — Telehealth: Payer: Self-pay

## 2014-05-14 ENCOUNTER — Telehealth: Payer: Self-pay | Admitting: Family Medicine

## 2014-05-14 NOTE — Telephone Encounter (Signed)
Pt informed and states that he doesn't want a mask that covers his mouth and nose. He wants to keep the mask he is using.   However, pt would like to know if there is anything he can use/take for the drainage.   Pt was informed that someone will get back with him tomorrow. Pt voiced understanding

## 2014-05-14 NOTE — Telephone Encounter (Signed)
OK to talk to res med about new mask but patient needs to know that the mask that covers the mouth also covers the nose. If he wants that we can send an order

## 2014-05-14 NOTE — Telephone Encounter (Signed)
Patient states that the cpap machine that he uses now is giving him sinus drainage. He would like to know if he could change from a nasal mask to one that blows air into his mouth? He states that he uses [employer] but any company would be fine.

## 2014-05-14 NOTE — Telephone Encounter (Signed)
Start with plain Mucinex 600 mg twice daily and nasal saline flushes twice daily. Let us know if no improvement

## 2014-05-14 NOTE — Telephone Encounter (Signed)
  Follow up Call-  Call back number 05/13/2014  Post procedure Call Back phone  # [phone]  Permission to leave phone message Yes     Patient questions:  Do you have a fever, pain , or abdominal swelling? no Pain Score  0 *  Have you tolerated food without any problems? yes  Have you been able to return to your normal activities? yes  Do you have any questions about your discharge instructions: Diet   no Medications  no Follow up visit  no  Do you have questions or concerns about your Care? no  Actions: * If pain score is 4 or above: No action needed, pain <4.

## 2014-05-15 ENCOUNTER — Encounter: Payer: Self-pay | Admitting: Physician Assistant

## 2014-05-15 ENCOUNTER — Ambulatory Visit (HOSPITAL_BASED_OUTPATIENT_CLINIC_OR_DEPARTMENT_OTHER): Admission: RE | Admit: 2014-05-15 | Payer: Medicare Other | Source: Ambulatory Visit

## 2014-05-15 ENCOUNTER — Ambulatory Visit (INDEPENDENT_AMBULATORY_CARE_PROVIDER_SITE_OTHER): Payer: Medicare Other | Admitting: Physician Assistant

## 2014-05-15 ENCOUNTER — Ambulatory Visit (HOSPITAL_BASED_OUTPATIENT_CLINIC_OR_DEPARTMENT_OTHER)
Admission: RE | Admit: 2014-05-15 | Discharge: 2014-05-15 | Disposition: A | Discharge status: Home / Self Care | Payer: Medicare Other | Source: Ambulatory Visit | Attending: Physician Assistant | Admitting: Physician Assistant

## 2014-05-15 ENCOUNTER — Other Ambulatory Visit: Payer: Self-pay | Admitting: Family Medicine

## 2014-05-15 ENCOUNTER — Telehealth: Payer: Self-pay | Admitting: Family Medicine

## 2014-05-15 ENCOUNTER — Other Ambulatory Visit: Payer: Self-pay

## 2014-05-15 ENCOUNTER — Telehealth: Payer: Self-pay | Admitting: *Deleted

## 2014-05-15 VITALS — BP 148/62 | HR 68 | Temp 97.8°F | Resp 16 | Ht 71.0 in | Wt 186.8 lb

## 2014-05-15 DIAGNOSIS — D649 Anemia, unspecified: Secondary | ICD-10-CM

## 2014-05-15 DIAGNOSIS — R0602 Shortness of breath: Secondary | ICD-10-CM

## 2014-05-15 DIAGNOSIS — I2510 Atherosclerotic heart disease of native coronary artery without angina pectoris: Secondary | ICD-10-CM

## 2014-05-15 DIAGNOSIS — J209 Acute bronchitis, unspecified: Secondary | ICD-10-CM

## 2014-05-15 DIAGNOSIS — J90 Pleural effusion, not elsewhere classified: Secondary | ICD-10-CM | POA: Insufficient documentation

## 2014-05-15 LAB — IRON AND TIBC
%SAT: 12 % — AB (ref 20–55)
IRON: 33 ug/dL — AB (ref 42–165)
TIBC: 265 ug/dL (ref 215–435)
UIBC: 232 ug/dL (ref 125–400)

## 2014-05-15 LAB — CBC
HCT: 29.8 % — ABNORMAL LOW (ref 39.0–52.0)
Hemoglobin: 10.4 g/dL — ABNORMAL LOW (ref 13.0–17.0)
MCH: 32.2 pg (ref 26.0–34.0)
MCHC: 34.9 g/dL (ref 30.0–36.0)
MCV: 92.3 fL (ref 78.0–100.0)
Platelets: 99 10*3/uL — ABNORMAL LOW (ref 150–400)
RBC: 3.23 MIL/uL — AB (ref 4.22–5.81)
RDW: 13.3 % (ref 11.5–15.5)
WBC: 6.3 10*3/uL (ref 4.0–10.5)

## 2014-05-15 MED ORDER — AMOXICILLIN 875 MG PO TABS: 875.0000 mg | ORAL_TABLET | Freq: Two times a day (BID) | ORAL | Status: DC

## 2014-05-15 NOTE — Telephone Encounter (Signed)
Will send as FYI to PCP: Pt was having issue with phone lines at living facility earlier, called on mobile and spoke with pt to Triage symptoms, as pt had already been placed on [facility] schedule for today at 3:30p w/o being triaged. Patient reports that last month when he seen his PCP [Dr. Blyth] , his Anemia had worsened & he was told that "he was bleeding internally somewhere" and was sent to Dr. Ardis Hughs [GI]; results note stated: Notes Recorded by Mosie Lukes, MD on 04/14/2014 at 11:07 PM Anemia worse all of a sudden. Please have him complete an IFOB and repeat cbc on 5/14 or 15 with iron studies and seek care if bloody or tarry stool note; labs ordered have never been completed by patient. Patient seen GI and had Colonoscopy & Endoscopy on 06.10.15, which did not show any location of internal bleed and informed patient that they would f/u in one month w/further testing. Patient seems to be very anxious about this matter and doesn't feel that he should wait that length of time. Patient is experiencing weakness, no energy & SOB on exertion; he is Asymptomatic of urgent symptoms [NO lightheaded/dizzy, change in vision, nausea/vomiting, H/A, pain in jaw/chest/arm]. Pt seems to be concerned about the Anemia/loss of blood and would like to have more tests done sooner. Reassured pt that we can do repeat labs [that will show [place] if blood count has dropped] and in office check for fecal blood at [facility] today; seemed to feel better about symptoms knowing this. Pt was instructed to proceed to Emergent facility if any symptoms discussed were to become present or worsening before his appointment today; pt understood & agreed. Provider informed/SLS

## 2014-05-15 NOTE — Telephone Encounter (Signed)
Pt called complaining of SOB when he walks around. I advised pt to come in to be seen to evaluate symptoms. Pt transferred to [facility] for possible work-in.

## 2014-05-15 NOTE — Telephone Encounter (Signed)
LMOM to CB. 

## 2014-05-15 NOTE — Patient Instructions (Addendum)
Please obtain labs. I will call you with your results.  Continue medications as directed.  Continue Eliquis.  If there are any worrisome findings, I will call you from home this weekend.  For congestion -- Stay well-hydrated.  Take antibiotic as directed with food.  Take Mucinex for cough/congestion.  Stay well hydrated.  Put a humidifier in your bedroom.  Please stop on the 1st floor for imaging.

## 2014-05-15 NOTE — Telephone Encounter (Signed)
Attempted multiple times to contact patient via [phone]. Call would not go through. Called cell phone multiple times and unable to reach patient at this time for triage. Left message for patient to contact office.

## 2014-05-15 NOTE — Telephone Encounter (Signed)
Call placed to patient for triage of difficulty breathing. Call placed to patient, no answer. Call is already being handled by Maryln Manuel RN.

## 2014-05-15 NOTE — Progress Notes (Signed)
Patient presents to clinic today c/o continued SOB with exertion and mild fatigue.  Patient also endorses occasional productive cough.  Denies wheezing or pleuritic chest pain.  Patient with recent diagnosis of anemia with + hemoccult.  Patient has underwent colonoscopy that was negative for bleed.  GI recommended capsule endoscopy to assess small intestines.  Patient is worried his anemia has worsened.  Patient denies fever, chills, sweats or aches.  Patient endorses taking medications as directed.   Past Medical History  Diagnosis Date  . CAD (coronary artery disease) prior stenting 1999   . Dyslipidemia   . HTN (hypertension)   . WPW (Wolff-Parkinson-White syndrome) loss of preexcitation   . Atrial fibrillation   . AV block, Mobitz II   . Presyncope   . Myocardial infarction   . Pacemaker 04/07/2013  . Sleep apnea     USES CPAP  . Arthritis   . History of chicken pox   . Hyperlipidemia   . Anemia   . Hypernatremia 06/03/2013  . Thrombocytopenia, unspecified 06/03/2013  . Leukopenia 06/03/2013  . Obstructive sleep apnea 06/03/2013  . Urinary incontinence 06/03/2013  . Atrial fibrillation   . Hyperglycemia 12/21/2013  . Pancytopenia 06/03/2013    Current Outpatient Prescriptions on File Prior to Visit  Medication Sig Dispense Refill  . apixaban (ELIQUIS) 5 MG TABS tablet Take 1 tablet (5 mg total) by mouth 2 (two) times daily.  180 tablet  3  . atorvastatin (LIPITOR) 10 MG tablet Take 1 tablet (10 mg total) by mouth every evening.  90 tablet  3  . Azelaic Acid (FINACEA) 15 % cream Apply 1 application topically daily. After skin is thoroughly washed and patted dry, gently but thoroughly massage a thin film of azelaic acid creame      . Cholecalciferol (VITAMIN D-3) 1000 UNITS CAPS Take 1 capsule by mouth daily.      . Ferrous Fumarate-Folic Acid (HEMOCYTE-F) 324-1 MG TABS Take 1 tablet by mouth daily.  30 each  3  . fluticasone (FLONASE) 50 MCG/ACT nasal spray Place 1 spray into the nose 2  (two) times daily. For seasonal allergies      . losartan (COZAAR) 25 MG tablet Take 1 tablet (25 mg total) by mouth daily.  90 tablet  3  . Multiple Vitamin (MULTIVITAMIN WITH MINERALS) TABS Take 1 tablet by mouth 2 (two) times a week.       . nadolol (CORGARD) 20 MG tablet TAKE ONE HALF TABLET ONCE DAILY  90 tablet  4  . nitroGLYCERIN (NITROSTAT) 0.4 MG SL tablet Place 1 tablet (0.4 mg total) under the tongue every 5 (five) minutes as needed for chest pain. x3 doses as needed for chest pain  25 tablet  12  . omeprazole (PRILOSEC) 20 MG capsule Take 1 capsule (20 mg total) by mouth daily.  30 capsule  3  . Probiotic Product (PROBIOTIC DAILY PO) Take by mouth daily.      Marland Kitchen pyridOXINE (VITAMIN B-6) 100 MG tablet Take 100 mg by mouth every evening.      . tamsulosin (FLOMAX) 0.4 MG CAPS capsule Take 0.4 mg by mouth at bedtime.      . vitamin B-12 (CYANOCOBALAMIN) 1000 MCG tablet Take 1,000 mcg by mouth every evening.       No current facility-administered medications on file prior to visit.    No Known Allergies  Family History  Problem Relation Age of Onset  . Stroke Mother   . Hypertension Mother   .  Stroke Sister   . Arthritis Sister     rheumatoid  . Heart attack Sister   . Anemia Sister   . Other Brother     tube put in aorta  . Anemia Brother   . Other Son     cortisone deficiency  . Arthritis Son   . Stroke Brother   . Alcohol abuse Brother   . Barrett's esophagus Son   . Colon cancer Maternal Grandmother   . Colon cancer Maternal Grandfather     History   Social History  . Marital Status: Married    Spouse Name: N/A    Number of Children: 3  . Years of Education: N/A   Occupational History  .      Retired   Social History Main Topics  . Smoking status: Former Smoker    Quit date: 12/05/1991  . Smokeless tobacco: Never Used  . Alcohol Use: Yes     Comment: Occasional  . Drug Use: No  . Sexual Activity: None   Other Topics Concern  . None   Social  History Narrative  . None   Review of Systems - See HPI.  All other ROS are negative.  BP 148/62  Pulse 68  Temp(Src) 97.8 F (36.6 C) (Oral)  Resp 16  Ht 5\' 11"  (1.803 m)  Wt 186 lb 12 oz (84.709 kg)  BMI 26.06 kg/m2  SpO2 98%  Physical Exam  Constitutional: He is oriented to person, place, and time and well-developed, well-nourished, and in no distress.  HENT:  Head: Normocephalic and atraumatic.  Right Ear: External ear normal.  Left Ear: External ear normal.  Nose: Nose normal.  Mouth/Throat: Oropharynx is clear and moist. No oropharyngeal exudate.  TM within normal limits bilaterally.  Eyes: Conjunctivae are normal. Pupils are equal, round, and reactive to light.  Neck: Neck supple.  Cardiovascular: Normal rate, regular rhythm, normal heart sounds and intact distal pulses.   Pulmonary/Chest: Effort normal and breath sounds normal. No respiratory distress. He has no wheezes. He has no rales. He exhibits no tenderness.  Lymphadenopathy:    He has no cervical adenopathy.  Neurological: He is alert and oriented to person, place, and time.  Skin: Skin is warm and dry. No rash noted.  Psychiatric: Affect normal.    Recent Results (from the past 2160 hour(s))  CBC     Status: Abnormal   Collection Time    04/14/14 10:44 AM      Result Value Ref Range   WBC 5.8  4.0 - 10.5 K/uL   RBC 3.33 (*) 4.22 - 5.81 MIL/uL   Hemoglobin 10.8 (*) 13.0 - 17.0 g/dL   HCT 31.6 (*) 39.0 - 52.0 %   MCV 94.9  78.0 - 100.0 fL   MCH 32.4  26.0 - 34.0 pg   MCHC 34.2  30.0 - 36.0 g/dL   RDW 13.3  11.5 - 15.5 %   Platelets 97 (*) 150 - 400 K/uL  RENAL FUNCTION PANEL     Status: Abnormal   Collection Time    04/14/14 10:44 AM      Result Value Ref Range   Sodium 139  135 - 145 mEq/L   Potassium 4.7  3.5 - 5.3 mEq/L   Chloride 105  96 - 112 mEq/L   CO2 25  19 - 32 mEq/L   Glucose, Bld 102 (*) 70 - 99 mg/dL   BUN 23  6 - 23 mg/dL   Creat 1.25  0.50 -  1.35 mg/dL   Albumin 3.8  3.5 - 5.2  g/dL   Calcium 8.8  8.4 - 10.5 mg/dL   Phosphorus 2.6  2.3 - 4.6 mg/dL  TSH     Status: None   Collection Time    04/14/14 10:44 AM      Result Value Ref Range   TSH 1.311  0.350 - 4.500 uIU/mL  HEMOGLOBIN A1C     Status: None   Collection Time    04/14/14 10:44 AM      Result Value Ref Range   Hemoglobin A1C 5.5  <5.7 %   Comment:                                                                            According to the ADA Clinical Practice Recommendations for 2011, when     HbA1c is used as a screening test:             >=6.5%   Diagnostic of Diabetes Mellitus                (if abnormal result is confirmed)           5.7-6.4%   Increased risk of developing Diabetes Mellitus           References:Diagnosis and Classification of Diabetes Mellitus,Diabetes     IRSW,5462,70(JJKKX 1):S62-S69 and Standards of Medical Care in             Diabetes - 2011,Diabetes Care,2011,34 (Suppl 1):S11-S61.         Mean Plasma Glucose 111  <117 mg/dL  FECAL OCCULT BLOOD, IMMUNOCHEMICAL     Status: Abnormal   Collection Time    04/28/14 11:59 AM      Result Value Ref Range   Fecal Occult Bld Positive (*) Negative  MDC_IDC_ENUM_SESS_TYPE_INCLINIC     Status: None   Collection Time    05/05/14  7:58 PM      Result Value Ref Range   Date Time Interrogation Session [id number]     Pulse Generator Manufacturer Medtronic     Pulse Gen Model [id number] Adapta     Pulse Gen Serial Number [id number]     RV Sense Sensitivity 2.80     RA Pace Amplitude 2.000     RV Pace PulseWidth 0.40     RV Pace Amplitude 2.500     RA Impedance 498     RA Amplitude 4.00     RA Pacing Amplitude 0.500     RA Pacing PulseWidth 0.40     RV IMPEDANCE 516     RV Amplitude 11.20     RV Pacing Amplitude 0.750     RV Pacing PulseWidth 0.40     Battery Status Unknown     Battery Longevity 138     Battery Voltage 2.80     Battery Impedance 100     Brady AP VP Percent 71     Brady AS VP Percent 29     Brady AP VS  Percent 0     Brady AS VS Percent 0     Eval Rhythm SR@62      Miscellaneous Comment       Value: Pacemaker check in clinic.  Normal device function. Thresholds, sensing, impedances consistent with previous measurements. Device programmed to maximize longevity. 17 mode switches, <0.1%, + eliquis.  No high ventricular rates noted. Device programmed at      appropriate safety margins. Histogram distribution appropriate for patient activity level. Device programmed to optimize intrinsic conduction. Estimated longevity 11.5 years. Patient enrolled in remote follow-up/TTM's with [employer]. Plan to follow every 3      months remotely and see annually in office. Patient education completed.  [facility] 08/06/14.  INTRINSIC FACTOR ANTIBODIES     Status: None   Collection Time    05/15/14  4:28 PM      Result Value Ref Range   Intrinsic Factor Negative  Negative  VITAMIN B12     Status: Abnormal   Collection Time    05/15/14  4:28 PM      Result Value Ref Range   Vitamin B-12 919 (*) 211 - 911 pg/mL  FOLATE     Status: None   Collection Time    05/15/14  4:28 PM      Result Value Ref Range   Folate >20.0     Comment:       Reference Ranges             Deficient:       0.4 - 3.3 ng/mL             Indeterminate:   3.4 - 5.4 ng/mL             Normal:              > 5.4 ng/mL        CBC     Status: Abnormal   Collection Time    05/15/14  4:28 PM      Result Value Ref Range   WBC 6.3  4.0 - 10.5 K/uL   RBC 3.23 (*) 4.22 - 5.81 MIL/uL   Hemoglobin 10.4 (*) 13.0 - 17.0 g/dL   HCT 29.8 (*) 39.0 - 52.0 %   MCV 92.3  78.0 - 100.0 fL   MCH 32.2  26.0 - 34.0 pg   MCHC 34.9  30.0 - 36.0 g/dL   RDW 13.3  11.5 - 15.5 %   Platelets 99 (*) 150 - 400 K/uL  IRON AND TIBC     Status: Abnormal   Collection Time    05/15/14  4:28 PM      Result Value Ref Range   Iron 33 (*) 42 - 165 ug/dL   UIBC 232  125 - 400 ug/dL   TIBC 265  215 - 435 ug/dL   %SAT 12 (*) 20 - 55 %    Assessment/Plan: Acute  bronchitis Rx azithromycin.  Increase fluids.  Rest. Saline nasal spray.  Plain Mucinex.  Humidifier in bedroom.  Will obtain CBC and CXR.  SOB (shortness of breath) Patient with recent diagnosis of anemia, slow bleed suspected.  Will recheck CBC, CMP and anemia panel.  Will also obtain CXR to assess for infection. Recommend he set up capsule endoscopy.  If anemia stable and CXR unremarkable, further workup may be needed.

## 2014-05-15 NOTE — Telephone Encounter (Signed)
I spoke with pt, advised him to come in for office visit to evaluate symptoms. Colletta [place] will try to work him in today.

## 2014-05-15 NOTE — Progress Notes (Signed)
Pre visit review using our clinic review tool, if applicable. No additional management support is needed unless otherwise documented below in the visit note/SLS  

## 2014-05-16 LAB — FOLATE

## 2014-05-16 LAB — VITAMIN B12: Vitamin B-12: 919 pg/mL — ABNORMAL HIGH (ref 211–911)

## 2014-05-16 LAB — INTRINSIC FACTOR ANTIBODIES: Intrinsic Factor: NEGATIVE

## 2014-05-16 MED ORDER — AZITHROMYCIN 250 MG PO TABS: ORAL_TABLET | ORAL | Status: DC

## 2014-05-16 NOTE — Telephone Encounter (Signed)
Spoke with patient concerning lab results and further instructions -- Hgb stable. Anemia panel pending. CXR with possible evidence of infection.  D/C amoxicillin and will start Azithromycin to cover bronchitis and possible developing CAP. CXR also reveals signs of early congestive heart failure.  Patient advised to return to clinic on Monday for appointment to re-evaluate and for further lab work to include BNP.  Will also need echocardiogram.  Advised to limit salt intake.  Continue other medications as directed.

## 2014-05-17 ENCOUNTER — Other Ambulatory Visit: Payer: Self-pay | Admitting: Family Medicine

## 2014-05-18 ENCOUNTER — Ambulatory Visit (INDEPENDENT_AMBULATORY_CARE_PROVIDER_SITE_OTHER): Payer: Medicare Other | Admitting: Physician Assistant

## 2014-05-18 ENCOUNTER — Encounter: Payer: Self-pay | Admitting: Physician Assistant

## 2014-05-18 VITALS — BP 130/62 | HR 76 | Temp 98.4°F | Resp 16 | Ht 71.0 in | Wt 187.8 lb

## 2014-05-18 DIAGNOSIS — I2510 Atherosclerotic heart disease of native coronary artery without angina pectoris: Secondary | ICD-10-CM

## 2014-05-18 DIAGNOSIS — J209 Acute bronchitis, unspecified: Secondary | ICD-10-CM

## 2014-05-18 DIAGNOSIS — I509 Heart failure, unspecified: Secondary | ICD-10-CM

## 2014-05-18 DIAGNOSIS — R0602 Shortness of breath: Secondary | ICD-10-CM | POA: Insufficient documentation

## 2014-05-18 MED ORDER — FUROSEMIDE 20 MG PO TABS: 20.0000 mg | ORAL_TABLET | Freq: Every day | ORAL | Status: DC

## 2014-05-18 NOTE — Patient Instructions (Signed)
Please finish antibiotic as directed.  Continue medications.  Take Lasix each am.  I will call you with your labs results.  You will be contacted for an echocardiogram.  I will try to contact Dr. Ardis Hughs to discuss your case with him.  The front desk will try to help you schedule an appointment with Cardiology.

## 2014-05-18 NOTE — Assessment & Plan Note (Signed)
Rx azithromycin.  Increase fluids.  Rest. Saline nasal spray.  Plain Mucinex.  Humidifier in bedroom.  Will obtain CBC and CXR.

## 2014-05-18 NOTE — Assessment & Plan Note (Signed)
Patient with recent diagnosis of anemia, slow bleed suspected.  Will recheck CBC, CMP and anemia panel.  Will also obtain CXR to assess for infection. Recommend he set up capsule endoscopy.  If anemia stable and CXR unremarkable, further workup may be needed.

## 2014-05-18 NOTE — Progress Notes (Signed)
Pre visit review using our clinic review tool, if applicable. No additional management support is needed unless otherwise documented below in the visit note/SLS  

## 2014-05-18 NOTE — Progress Notes (Signed)
Patient presents to clinic today c/o to discuss lab results in further detail.  Patient with chronic complaint of SOB with history of OSA and anemia related to small blood loss, presented to clinic last week for his chronic symptoms.  Since last visit labs and cxr were obtained.  Anemia is stable. Patient still with upcoming capsule endoscopy.  CXR revealed possible beginnings of respiratory infection, as well as signs on CHF.  Patient here today for further workup.  Endorses some improvement in symptoms since starting antibiotic.  endorses taking as directed.  Denies new or worsening symptoms.  Past Medical History  Diagnosis Date  . CAD (coronary artery disease) prior stenting 1999   . Dyslipidemia   . HTN (hypertension)   . WPW (Wolff-Parkinson-White syndrome) loss of preexcitation   . Atrial fibrillation   . AV block, Mobitz II   . Presyncope   . Myocardial infarction   . Pacemaker 04/07/2013  . Sleep apnea     USES CPAP  . Arthritis   . History of chicken pox   . Hyperlipidemia   . Anemia   . Hypernatremia 06/03/2013  . Thrombocytopenia, unspecified 06/03/2013  . Leukopenia 06/03/2013  . Obstructive sleep apnea 06/03/2013  . Urinary incontinence 06/03/2013  . Atrial fibrillation   . Hyperglycemia 12/21/2013  . Pancytopenia 06/03/2013    Current Outpatient Prescriptions on File Prior to Visit  Medication Sig Dispense Refill  . apixaban (ELIQUIS) 5 MG TABS tablet Take 1 tablet (5 mg total) by mouth 2 (two) times daily.  180 tablet  3  . atorvastatin (LIPITOR) 10 MG tablet Take 1 tablet (10 mg total) by mouth every evening.  90 tablet  3  . Azelaic Acid (FINACEA) 15 % cream Apply 1 application topically daily. After skin is thoroughly washed and patted dry, gently but thoroughly massage a thin film of azelaic acid creame      . azithromycin (ZITHROMAX) 250 MG tablet Take 2 tablets on Day 1.  Then take 1 tablet daily.  6 tablet  0  . Cholecalciferol (VITAMIN D-3) 1000 UNITS CAPS Take 1 capsule  by mouth daily.      . Ferrous Fumarate-Folic Acid (HEMOCYTE-F) 324-1 MG TABS Take 1 tablet by mouth daily.  30 each  3  . fluticasone (FLONASE) 50 MCG/ACT nasal spray Place 1 spray into the nose 2 (two) times daily. For seasonal allergies      . losartan (COZAAR) 25 MG tablet Take 1 tablet (25 mg total) by mouth daily.  90 tablet  3  . Multiple Vitamin (MULTIVITAMIN WITH MINERALS) TABS Take 1 tablet by mouth 2 (two) times a week.       . nadolol (CORGARD) 20 MG tablet TAKE ONE HALF TABLET ONCE DAILY  90 tablet  4  . nitroGLYCERIN (NITROSTAT) 0.4 MG SL tablet Place 1 tablet (0.4 mg total) under the tongue every 5 (five) minutes as needed for chest pain. x3 doses as needed for chest pain  25 tablet  12  . omeprazole (PRILOSEC) 20 MG capsule Take 1 capsule (20 mg total) by mouth daily.  30 capsule  3  . Probiotic Product (PROBIOTIC DAILY PO) Take 1 capsule by mouth daily.       Marland Kitchen pyridOXINE (VITAMIN B-6) 100 MG tablet Take 100 mg by mouth every evening.      . tamsulosin (FLOMAX) 0.4 MG CAPS capsule Take 0.4 mg by mouth at bedtime.      . vitamin B-12 (CYANOCOBALAMIN) 1000 MCG tablet Take 1,000  mcg by mouth every evening.       No current facility-administered medications on file prior to visit.    No Known Allergies  Family History  Problem Relation Age of Onset  . Stroke Mother   . Hypertension Mother   . Stroke Sister   . Arthritis Sister     rheumatoid  . Heart attack Sister   . Anemia Sister   . Other Brother     tube put in aorta  . Anemia Brother   . Other Son     cortisone deficiency  . Arthritis Son   . Stroke Brother   . Alcohol abuse Brother   . Barrett's esophagus Son   . Colon cancer Maternal Grandmother   . Colon cancer Maternal Grandfather     History   Social History  . Marital Status: Married    Spouse Name: N/A    Number of Children: 3  . Years of Education: N/A   Occupational History  .      Retired   Social History Main Topics  . Smoking status:  Former Smoker    Quit date: 12/05/1991  . Smokeless tobacco: Never Used  . Alcohol Use: Yes     Comment: Occasional  . Drug Use: No  . Sexual Activity: None   Other Topics Concern  . None   Social History Narrative  . None   Review of Systems - See HPI.  All other ROS are negative.  BP 130/62  Pulse 76  Temp(Src) 98.4 F (36.9 C) (Oral)  Resp 16  Ht 5\' 11"  (1.803 m)  Wt 187 lb 12 oz (85.163 kg)  BMI 26.20 kg/m2  SpO2 98%  Physical Exam  Vitals reviewed. Constitutional: He is oriented to person, place, and time and well-developed, well-nourished, and in no distress.  Cardiovascular: Normal rate, regular rhythm, normal heart sounds and intact distal pulses.   Pulses:      Dorsalis pedis pulses are 2+ on the right side, and 2+ on the left side.       Posterior tibial pulses are 2+ on the right side, and 2+ on the left side.  No peripheral edema or JVD noted.  Pulmonary/Chest: Effort normal and breath sounds normal. No respiratory distress. He has no wheezes. He has no rales. He exhibits no tenderness.  Neurological: He is alert and oriented to person, place, and time.  Skin: Skin is warm and dry. No rash noted.  Psychiatric: Affect normal.    Recent Results (from the past 2160 hour(s))  CBC     Status: Abnormal   Collection Time    04/14/14 10:44 AM      Result Value Ref Range   WBC 5.8  4.0 - 10.5 K/uL   RBC 3.33 (*) 4.22 - 5.81 MIL/uL   Hemoglobin 10.8 (*) 13.0 - 17.0 g/dL   HCT 31.6 (*) 39.0 - 52.0 %   MCV 94.9  78.0 - 100.0 fL   MCH 32.4  26.0 - 34.0 pg   MCHC 34.2  30.0 - 36.0 g/dL   RDW 13.3  11.5 - 15.5 %   Platelets 97 (*) 150 - 400 K/uL  RENAL FUNCTION PANEL     Status: Abnormal   Collection Time    04/14/14 10:44 AM      Result Value Ref Range   Sodium 139  135 - 145 mEq/L   Potassium 4.7  3.5 - 5.3 mEq/L   Chloride 105  96 - 112 mEq/L  CO2 25  19 - 32 mEq/L   Glucose, Bld 102 (*) 70 - 99 mg/dL   BUN 23  6 - 23 mg/dL   Creat 1.25  0.50 - 1.35  mg/dL   Albumin 3.8  3.5 - 5.2 g/dL   Calcium 8.8  8.4 - 10.5 mg/dL   Phosphorus 2.6  2.3 - 4.6 mg/dL  TSH     Status: None   Collection Time    04/14/14 10:44 AM      Result Value Ref Range   TSH 1.311  0.350 - 4.500 uIU/mL  HEMOGLOBIN A1C     Status: None   Collection Time    04/14/14 10:44 AM      Result Value Ref Range   Hemoglobin A1C 5.5  <5.7 %   Comment:                                                                            According to the ADA Clinical Practice Recommendations for 2011, when     HbA1c is used as a screening test:             >=6.5%   Diagnostic of Diabetes Mellitus                (if abnormal result is confirmed)           5.7-6.4%   Increased risk of developing Diabetes Mellitus           References:Diagnosis and Classification of Diabetes Mellitus,Diabetes     LIDC,3013,14(HOOIL 1):S62-S69 and Standards of Medical Care in             Diabetes - 2011,Diabetes Care,2011,34 (Suppl 1):S11-S61.         Mean Plasma Glucose 111  <117 mg/dL  FECAL OCCULT BLOOD, IMMUNOCHEMICAL     Status: Abnormal   Collection Time    04/28/14 11:59 AM      Result Value Ref Range   Fecal Occult Bld Positive (*) Negative  MDC_IDC_ENUM_SESS_TYPE_INCLINIC     Status: None   Collection Time    05/05/14  7:58 PM      Result Value Ref Range   Date Time Interrogation Session [id number]     Pulse Generator Manufacturer Medtronic     Pulse Gen Model [id number] Adapta     Pulse Gen Serial Number [id number]     RV Sense Sensitivity 2.80     RA Pace Amplitude 2.000     RV Pace PulseWidth 0.40     RV Pace Amplitude 2.500     RA Impedance 498     RA Amplitude 4.00     RA Pacing Amplitude 0.500     RA Pacing PulseWidth 0.40     RV IMPEDANCE 516     RV Amplitude 11.20     RV Pacing Amplitude 0.750     RV Pacing PulseWidth 0.40     Battery Status Unknown     Battery Longevity 138     Battery Voltage 2.80     Battery Impedance 100     Brady AP VP Percent 71     Brady AS  VP Percent 29     Brady AP VS Percent 0  Brady AS VS Percent 0     Eval Rhythm SR@62      Miscellaneous Comment       Value: Pacemaker check in clinic. Normal device function. Thresholds, sensing, impedances consistent with previous measurements. Device programmed to maximize longevity. 17 mode switches, <0.1%, + eliquis.  No high ventricular rates noted. Device programmed at      appropriate safety margins. Histogram distribution appropriate for patient activity level. Device programmed to optimize intrinsic conduction. Estimated longevity 11.5 years. Patient enrolled in remote follow-up/TTM's with [employer]. Plan to follow every 3      months remotely and see annually in office. Patient education completed.  [facility] 08/06/14.  INTRINSIC FACTOR ANTIBODIES     Status: None   Collection Time    05/15/14  4:28 PM      Result Value Ref Range   Intrinsic Factor Negative  Negative  VITAMIN B12     Status: Abnormal   Collection Time    05/15/14  4:28 PM      Result Value Ref Range   Vitamin B-12 919 (*) 211 - 911 pg/mL  FOLATE     Status: None   Collection Time    05/15/14  4:28 PM      Result Value Ref Range   Folate >20.0     Comment:       Reference Ranges             Deficient:       0.4 - 3.3 ng/mL             Indeterminate:   3.4 - 5.4 ng/mL             Normal:              > 5.4 ng/mL        VITAMIN B1     Status: None   Collection Time    05/15/14  4:28 PM      Result Value Ref Range   Vitamin B1 (Thiamine) 11  8 - 30 nmol/L  CBC     Status: Abnormal   Collection Time    05/15/14  4:28 PM      Result Value Ref Range   WBC 6.3  4.0 - 10.5 K/uL   RBC 3.23 (*) 4.22 - 5.81 MIL/uL   Hemoglobin 10.4 (*) 13.0 - 17.0 g/dL   HCT 29.8 (*) 39.0 - 52.0 %   MCV 92.3  78.0 - 100.0 fL   MCH 32.2  26.0 - 34.0 pg   MCHC 34.9  30.0 - 36.0 g/dL   RDW 13.3  11.5 - 15.5 %   Platelets 99 (*) 150 - 400 K/uL  IRON AND TIBC     Status: Abnormal   Collection Time    05/15/14  4:28 PM       Result Value Ref Range   Iron 33 (*) 42 - 165 ug/dL   UIBC 232  125 - 400 ug/dL   TIBC 265  215 - 435 ug/dL   %SAT 12 (*) 20 - 55 %  BRAIN NATRIURETIC PEPTIDE     Status: Abnormal   Collection Time    05/18/14  2:37 PM      Result Value Ref Range   Brain Natriuretic Peptide 357.1 (*) 0.0 - 100.0 pg/mL  BRAIN NATRIURETIC PEPTIDE     Status: Abnormal   Collection Time    05/20/14 11:16 AM      Result Value Ref Range  Brain Natriuretic Peptide 330.0 (*) 0.0 - 100.0 pg/mL  BASIC METABOLIC PANEL     Status: Abnormal   Collection Time    05/20/14 11:16 AM      Result Value Ref Range   Sodium 137  135 - 145 mEq/L   Potassium 3.9  3.5 - 5.3 mEq/L   Chloride 102  96 - 112 mEq/L   CO2 27  19 - 32 mEq/L   Glucose, Bld 117 (*) 70 - 99 mg/dL   BUN 26 (*) 6 - 23 mg/dL   Creat 1.34  0.50 - 1.35 mg/dL   Calcium 8.9  8.4 - 10.5 mg/dL   Assessment/Plan: CHF (congestive heart failure) Will obtain BNP and Echocardiogram. Appointment scheduled with Cardiology. Monitor fluid and salt intake.  Acute bronchitis Continue antibiotic and supportive measures as discussed at last visit.

## 2014-05-19 ENCOUNTER — Telehealth: Payer: Self-pay

## 2014-05-19 ENCOUNTER — Encounter: Payer: Self-pay | Admitting: Gastroenterology

## 2014-05-19 LAB — BRAIN NATRIURETIC PEPTIDE: BRAIN NATRIURETIC PEPTIDE: 357.1 pg/mL — AB (ref 0.0–100.0)

## 2014-05-19 NOTE — Telephone Encounter (Signed)
FYI:  Pt left a message stating that [facility] is sending some paperwork for md to sign so he can get a different cpap mask.  Pt stated he just wanted md to know he did ask [facility] to send something

## 2014-05-20 ENCOUNTER — Telehealth: Payer: Self-pay | Admitting: Family Medicine

## 2014-05-20 ENCOUNTER — Encounter: Payer: Medicare Other | Admitting: Cardiology

## 2014-05-20 DIAGNOSIS — I509 Heart failure, unspecified: Secondary | ICD-10-CM

## 2014-05-20 LAB — VITAMIN B1: VITAMIN B1 (THIAMINE): 11 nmol/L (ref 8–30)

## 2014-05-20 NOTE — Progress Notes (Signed)
HPI: FU coronary artery disease and atrial fibrillation. The patient had a previous LAD infarct and had PCI of his LAD in April of 1999. Carotid Dopplers in April of 2014 showed 0-39% bilateral stenosis. Followup recommended in 2 years. Last nuclear study in April of 2014 showed no scar or ischemia. There was possible atypical flutter in recovery. Abdominal ultrasound in May 2014 showed no aneurysm. Previous monitor showed symptomatic 2:1 AV block and paroxysmal atrial fibrillation. Patient had a pacemaker placed. Echocardiogram in June of 2014 showed an ejection fraction of 50-55%, moderate mitral regurgitation, trace aortic insufficiency and mild left atrial enlargement. Patient has recently been seen for dyspnea. Treated for possible bronchitis. Chest x-ray 05/15/2014 showed bilateral pleural effusions. BNP on June 16 357. Patient undergoing evaluation for anemia/thrombocytopenia. Hemoglobin June 12 10.4 with platelet count 99,000.    Current Outpatient Prescriptions  Medication Sig Dispense Refill  . apixaban (ELIQUIS) 5 MG TABS tablet Take 1 tablet (5 mg total) by mouth 2 (two) times daily.  180 tablet  3  . atorvastatin (LIPITOR) 10 MG tablet Take 1 tablet (10 mg total) by mouth every evening.  90 tablet  3  . Azelaic Acid (FINACEA) 15 % cream Apply 1 application topically daily. After skin is thoroughly washed and patted dry, gently but thoroughly massage a thin film of azelaic acid creame      . azithromycin (ZITHROMAX) 250 MG tablet Take 2 tablets on Day 1.  Then take 1 tablet daily.  6 tablet  0  . Cholecalciferol (VITAMIN D-3) 1000 UNITS CAPS Take 1 capsule by mouth daily.      . Ferrous Fumarate-Folic Acid (HEMOCYTE-F) 324-1 MG TABS Take 1 tablet by mouth daily.  30 each  3  . fluticasone (FLONASE) 50 MCG/ACT nasal spray Place 1 spray into the nose 2 (two) times daily. For seasonal allergies      . furosemide (LASIX) 20 MG tablet Take 1 tablet (20 mg total) by mouth daily.  30  tablet  1  . losartan (COZAAR) 25 MG tablet Take 1 tablet (25 mg total) by mouth daily.  90 tablet  3  . Multiple Vitamin (MULTIVITAMIN WITH MINERALS) TABS Take 1 tablet by mouth 2 (two) times a week.       . nadolol (CORGARD) 20 MG tablet TAKE ONE HALF TABLET ONCE DAILY  90 tablet  4  . nitroGLYCERIN (NITROSTAT) 0.4 MG SL tablet Place 1 tablet (0.4 mg total) under the tongue every 5 (five) minutes as needed for chest pain. x3 doses as needed for chest pain  25 tablet  12  . omeprazole (PRILOSEC) 20 MG capsule Take 1 capsule (20 mg total) by mouth daily.  30 capsule  3  . Probiotic Product (PROBIOTIC DAILY PO) Take 1 capsule by mouth daily.       Marland Kitchen pyridOXINE (VITAMIN B-6) 100 MG tablet Take 100 mg by mouth every evening.      . tamsulosin (FLOMAX) 0.4 MG CAPS capsule Take 0.4 mg by mouth at bedtime.      . vitamin B-12 (CYANOCOBALAMIN) 1000 MCG tablet Take 1,000 mcg by mouth every evening.       No current facility-administered medications for this visit.     Past Medical History  Diagnosis Date  . CAD (coronary artery disease) prior stenting 1999   . Dyslipidemia   . HTN (hypertension)   . WPW (Wolff-Parkinson-White syndrome) loss of preexcitation   . Atrial fibrillation   . AV block,  Mobitz II   . Presyncope   . Myocardial infarction   . Pacemaker 04/07/2013  . Sleep apnea     USES CPAP  . Arthritis   . History of chicken pox   . Hyperlipidemia   . Anemia   . Hypernatremia 06/03/2013  . Thrombocytopenia, unspecified 06/03/2013  . Leukopenia 06/03/2013  . Obstructive sleep apnea 06/03/2013  . Urinary incontinence 06/03/2013  . Atrial fibrillation   . Hyperglycemia 12/21/2013  . Pancytopenia 06/03/2013    Past Surgical History  Procedure Laterality Date  . Coronary angioplasty with stent placement    . Vastectomy    . Insert / replace / remove pacemaker  04/07/2013    History   Social History  . Marital Status: Married    Spouse Name: N/A    Number of Children: 3  . Years of  Education: N/A   Occupational History  .      Retired   Social History Main Topics  . Smoking status: Former Smoker    Quit date: 12/05/1991  . Smokeless tobacco: Never Used  . Alcohol Use: Yes     Comment: Occasional  . Drug Use: No  . Sexual Activity: Not on file   Other Topics Concern  . Not on file   Social History Narrative  . No narrative on file    ROS: no fevers or chills, productive cough, hemoptysis, dysphasia, odynophagia, melena, hematochezia, dysuria, hematuria, rash, seizure activity, orthopnea, PND, pedal edema, claudication. Remaining systems are negative.  Physical Exam: Well-developed well-nourished in no acute distress.  Skin is warm and dry.  HEENT is normal.  Neck is supple.  Chest is clear to auscultation with normal expansion.  Cardiovascular exam is regular rate and rhythm.  Abdominal exam nontender or distended. No masses palpated. Extremities show no edema. neuro grossly intact  ECG     This encounter was created in error - please disregard.

## 2014-05-20 NOTE — Telephone Encounter (Signed)
Please inform pt that the results are sent to his mychart for him to look at. Have him please check that.  But also this is what md said Anemia essentially stable, intrinsic factor neg and B12 actually hi, no need for increased supplements. Could lower the level being taken some. Other results normal so far

## 2014-05-20 NOTE — Telephone Encounter (Signed)
Patient states that he has looked in [place] but does not understand results. Please call patient

## 2014-05-20 NOTE — Telephone Encounter (Signed)
Please call patient. This is clinical and I do not feel comfortable telling patient this. Thanks!

## 2014-05-20 NOTE — Telephone Encounter (Signed)
Patient informed, reports that he missed his appointment with Dr. Stanford Breed this morning d/t coming in at wrong time and is not rescheduled until 07.01.15/SLS Per VO Dr. Charlett Blake, patient is to double dosage of Lasix for the next three days [40 mg total daily] and come in for STAT labs Friday [repeat BNP & Renal] and this will let us know if there is an urgency for pt to be seen sooner by Cardiology. Patient informed, understood & agreed; lab orders placed. Debra w/Dr. Stanford Breed also informed of provider instructions/SLS

## 2014-05-20 NOTE — Telephone Encounter (Signed)
Requesting lab results

## 2014-05-20 NOTE — Telephone Encounter (Signed)
Ok tell him to check his mychart. The message is on there.

## 2014-05-22 LAB — BASIC METABOLIC PANEL
BUN: 26 mg/dL — AB (ref 6–23)
CO2: 27 mEq/L (ref 19–32)
Calcium: 8.9 mg/dL (ref 8.4–10.5)
Chloride: 102 mEq/L (ref 96–112)
Creat: 1.34 mg/dL (ref 0.50–1.35)
Glucose, Bld: 117 mg/dL — ABNORMAL HIGH (ref 70–99)
Potassium: 3.9 mEq/L (ref 3.5–5.3)
SODIUM: 137 meq/L (ref 135–145)

## 2014-05-22 LAB — BRAIN NATRIURETIC PEPTIDE: Brain Natriuretic Peptide: 330 pg/mL — ABNORMAL HIGH (ref 0.0–100.0)

## 2014-05-24 DIAGNOSIS — I509 Heart failure, unspecified: Secondary | ICD-10-CM | POA: Insufficient documentation

## 2014-05-24 NOTE — Assessment & Plan Note (Signed)
Will obtain BNP and Echocardiogram. Appointment scheduled with Cardiology. Monitor fluid and salt intake.

## 2014-05-24 NOTE — Assessment & Plan Note (Signed)
Continue antibiotic and supportive measures as discussed at last visit.

## 2014-05-25 ENCOUNTER — Telehealth: Payer: Self-pay | Admitting: Family Medicine

## 2014-05-25 NOTE — Telephone Encounter (Signed)
I did this paperwork already, It should be there somewhere I would prefer not to do it again.

## 2014-05-25 NOTE — Telephone Encounter (Signed)
Paperwork refaxed and in provider's box on desk/SLS

## 2014-05-25 NOTE — Telephone Encounter (Signed)
Patient wants to know if we received this paperwork

## 2014-05-25 NOTE — Telephone Encounter (Signed)
Patient left message requesting the status of his paperwork that was sent over from [facility] for his full face mask attachment for Cpap machine.

## 2014-05-26 NOTE — Telephone Encounter (Signed)
The paperwork was found and re-faxed, patient informed

## 2014-05-26 NOTE — Telephone Encounter (Signed)
Justin Booty, do you know where this paperwork is? [facility] called again stating that something has been wrong with their fax and still have not received this. Fax# [phone]

## 2014-05-27 ENCOUNTER — Ambulatory Visit (HOSPITAL_COMMUNITY)
Admission: RE | Admit: 2014-05-27 | Discharge: 2014-05-27 | Disposition: A | Discharge status: Home / Self Care | Payer: Medicare Other | Source: Ambulatory Visit | Attending: Physician Assistant | Admitting: Physician Assistant

## 2014-05-27 DIAGNOSIS — I059 Rheumatic mitral valve disease, unspecified: Secondary | ICD-10-CM | POA: Insufficient documentation

## 2014-05-27 DIAGNOSIS — I509 Heart failure, unspecified: Secondary | ICD-10-CM

## 2014-05-27 NOTE — Telephone Encounter (Signed)
Paperwork brought in office this Am by Dr. Charlett Blake completed, forwarded to [facility] for faxing to [facility]/SLS

## 2014-05-27 NOTE — Telephone Encounter (Signed)
Faxed paperwork again to [facility].

## 2014-05-27 NOTE — Progress Notes (Signed)
2D Echo Performed 05/27/2014    Tammie Crouch, RCS  

## 2014-05-29 NOTE — Progress Notes (Signed)
Pt informed and voiced understanding

## 2014-06-01 ENCOUNTER — Telehealth: Payer: Self-pay

## 2014-06-01 NOTE — Telephone Encounter (Signed)
Message copied by Varney Daily on Mon Jun 01, 2014  8:39 AM ------      Message from: Penni Homans A      Created: Fri May 29, 2014  7:41 PM       Let him know I ran the echo results by Dr Stanford Breed, no structural changes but the function is a little less so he agreed to see him in follow up. Please let him know and have jenn call to set him up an appt with with cardiology for further consideration. Thx      ----- Message -----         From: Lelon Perla, MD         Sent: 05/29/2014   6:02 PM           To: Mosie Lukes, MD      Subject: RE: heavin, Flavio echo results                           i would be happy to see in fu. Thanks!      BC            ----- Message -----         From: Mosie Lukes, MD         Sent: 05/29/2014   5:05 PM           To: Lelon Perla, MD      Subject: Otila Kluver, [place] echo results                               Hoping you would be willing to review this mutual patient's new Echo. He has begun to experience increased SOB and his BNP was elevated so we repeated an Echo.             No major changes in EF, structure or valves which I passed on to him but he did show worsening of his diastolic dysfunction.       Please let me know if you think it would be beneficial for you to see him again sooner or if you would like me to adjust his meds and monitor him here.            Thanks for your assistance            Mosie Lukes, MD             ------

## 2014-06-01 NOTE — Telephone Encounter (Signed)
FYI:  I called and informed patient and he stated that he has an appt with Dr Stanford Breed at 11:30 on July 1st.

## 2014-06-03 ENCOUNTER — Encounter: Payer: Self-pay | Admitting: Cardiology

## 2014-06-03 ENCOUNTER — Ambulatory Visit (INDEPENDENT_AMBULATORY_CARE_PROVIDER_SITE_OTHER): Payer: Medicare Other | Admitting: Cardiology

## 2014-06-03 VITALS — BP 170/80 | HR 81 | Ht 71.0 in | Wt 188.0 lb

## 2014-06-03 DIAGNOSIS — I5032 Chronic diastolic (congestive) heart failure: Secondary | ICD-10-CM

## 2014-06-03 DIAGNOSIS — R0602 Shortness of breath: Secondary | ICD-10-CM

## 2014-06-03 DIAGNOSIS — D6489 Other specified anemias: Secondary | ICD-10-CM

## 2014-06-03 DIAGNOSIS — I5030 Unspecified diastolic (congestive) heart failure: Secondary | ICD-10-CM | POA: Insufficient documentation

## 2014-06-03 DIAGNOSIS — E785 Hyperlipidemia, unspecified: Secondary | ICD-10-CM

## 2014-06-03 DIAGNOSIS — Z950 Presence of cardiac pacemaker: Secondary | ICD-10-CM

## 2014-06-03 DIAGNOSIS — I059 Rheumatic mitral valve disease, unspecified: Secondary | ICD-10-CM

## 2014-06-03 DIAGNOSIS — I509 Heart failure, unspecified: Secondary | ICD-10-CM

## 2014-06-03 DIAGNOSIS — I2510 Atherosclerotic heart disease of native coronary artery without angina pectoris: Secondary | ICD-10-CM

## 2014-06-03 DIAGNOSIS — I10 Essential (primary) hypertension: Secondary | ICD-10-CM

## 2014-06-03 DIAGNOSIS — I679 Cerebrovascular disease, unspecified: Secondary | ICD-10-CM

## 2014-06-03 DIAGNOSIS — I340 Nonrheumatic mitral (valve) insufficiency: Secondary | ICD-10-CM

## 2014-06-03 DIAGNOSIS — I2583 Coronary atherosclerosis due to lipid rich plaque: Secondary | ICD-10-CM

## 2014-06-03 MED ORDER — LOSARTAN POTASSIUM 50 MG PO TABS: 50.0000 mg | ORAL_TABLET | Freq: Every day | ORAL | Status: AC

## 2014-06-03 MED ORDER — FUROSEMIDE 20 MG PO TABS: 40.0000 mg | ORAL_TABLET | Freq: Every day | ORAL | Status: DC

## 2014-06-03 NOTE — Assessment & Plan Note (Signed)
Patient has developed anemia. He also has thrombocytopenia. This could be contributing to his fatigue. GI evaluation in progress. He is also scheduled to see hematology. Continue iron. Repeat hemoglobin.

## 2014-06-03 NOTE — Assessment & Plan Note (Signed)
Continue statin. 

## 2014-06-03 NOTE — Assessment & Plan Note (Signed)
Continue statin. Followup carotid Dopplers April 2016.

## 2014-06-03 NOTE — Patient Instructions (Addendum)
Your physician recommends that you schedule a follow-up appointment in: Regino Ramirez TO 40 MG ONCE DAILY= 2 20 MG TABLETS ONCE DAILY  INCREASE LOSARTAN TO 10 MG ONCE DAILY  Your physician recommends that you return for lab work IN ONE WEEK IN [place]  A chest x-ray takes a picture of the organs and structures inside the chest, including the heart, lungs, and blood vessels. This test can show several things, including, whether the heart is enlarges; whether fluid is building up in the lungs; and whether pacemaker / defibrillator leads are still in place. IN ONE WEEK IN [place]

## 2014-06-03 NOTE — Assessment & Plan Note (Signed)
Moderate on most recent echocardiogram. Question contributing to CHF. Blood pressure needs improved control. Increase Cozaar. If symptoms persist could consider transesophageal echocardiogram to further assess.

## 2014-06-03 NOTE — Assessment & Plan Note (Signed)
Patient has developed dyspnea on exertion and cough. Previous chest x-ray showed pleural effusions. Echocardiogram showed diastolic dysfunction. Increase Lasix to 40 mg daily. Check potassium, renal function and BNP in one week. Repeat chest x-ray in one week following diuresis. Hopefully his symptoms will improve with diuresis. Otherwise he may require a pulmonary evaluation.

## 2014-06-03 NOTE — Progress Notes (Signed)
HPI: FU coronary artery disease and atrial fibrillation. The patient had a previous LAD infarct and had PCI of his LAD in April of 1999. Carotid Dopplers in April of 2014 showed 0-39% bilateral stenosis. Followup recommended in 2 years. Last nuclear study in April of 2014 showed no scar or ischemia. There was possible atypical flutter in recovery. Abdominal ultrasound in May 2014 showed no aneurysm. Previous monitor showed symptomatic 2:1 AV block and paroxysmal atrial fibrillation. Patient had a pacemaker placed. Patient seen recently for increased dyspnea. Chest x-ray June 2015 showed bilateral pleural effusions and vascular congestion. Echocardiogram in June of 2015 showed normal LV function, grade 2 diastolic dysfunction, mild aortic insufficiency, moderate mitral regurgitation, mild left atrial enlargement. Laboratories in June of 2015 showed a BNP of 330. BUN 26 and creatinine 1.34. Hemoglobin 10.4 and platelet count 99,000. Since he was last seen, He has noticed fatigue. He also describes dyspnea on exertion that improved with diuretics but persists. He did have orthopnea but this has resolved with diuretics. Minimal pedal edema. No chest pain. He has developed a cough that is nonproductive and not related to position. Note he apparently has had an EGD and colonoscopy for anemia which has been essentially unrevealing. He may require capsule endoscopy. He has an appointment to see hematology for anemia/thrombocytopenia. TSH in May 2015 normal.   Current Outpatient Prescriptions  Medication Sig Dispense Refill  . apixaban (ELIQUIS) 5 MG TABS tablet Take 1 tablet (5 mg total) by mouth 2 (two) times daily.  180 tablet  3  . atorvastatin (LIPITOR) 10 MG tablet Take 1 tablet (10 mg total) by mouth every evening.  90 tablet  3  . Azelaic Acid (FINACEA) 15 % cream Apply 1 application topically daily. After skin is thoroughly washed and patted dry, gently but thoroughly massage a thin film of azelaic  acid creame      . Cholecalciferol (VITAMIN D-3) 1000 UNITS CAPS Take 1 capsule by mouth daily.      . Ferrous Fumarate-Folic Acid (HEMOCYTE-F) 324-1 MG TABS Take 1 tablet by mouth daily.  30 each  3  . fluticasone (FLONASE) 50 MCG/ACT nasal spray Place 1 spray into the nose 2 (two) times daily. For seasonal allergies      . furosemide (LASIX) 20 MG tablet Take 1 tablet (20 mg total) by mouth daily.  30 tablet  1  . losartan (COZAAR) 25 MG tablet Take 1 tablet (25 mg total) by mouth daily.  90 tablet  3  . Multiple Vitamin (MULTIVITAMIN WITH MINERALS) TABS Take 1 tablet by mouth 2 (two) times a week.       . nadolol (CORGARD) 20 MG tablet TAKE ONE HALF TABLET ONCE DAILY  90 tablet  4  . nitroGLYCERIN (NITROSTAT) 0.4 MG SL tablet Place 1 tablet (0.4 mg total) under the tongue every 5 (five) minutes as needed for chest pain. x3 doses as needed for chest pain  25 tablet  12  . omeprazole (PRILOSEC) 20 MG capsule Take 20 mg by mouth as needed.      . Probiotic Product (PROBIOTIC DAILY PO) Take 1 capsule by mouth daily.       Marland Kitchen pyridOXINE (VITAMIN B-6) 100 MG tablet Take 100 mg by mouth every evening.      . tamsulosin (FLOMAX) 0.4 MG CAPS capsule Take 0.4 mg by mouth at bedtime.      . vitamin B-12 (CYANOCOBALAMIN) 1000 MCG tablet Take 1,000 mcg by mouth every evening.  No current facility-administered medications for this visit.     Past Medical History  Diagnosis Date  . CAD (coronary artery disease) prior stenting 1999   . Dyslipidemia   . HTN (hypertension)   . WPW (Wolff-Parkinson-White syndrome) loss of preexcitation   . Atrial fibrillation   . AV block, Mobitz II   . Presyncope   . Myocardial infarction   . Pacemaker 04/07/2013  . Sleep apnea     USES CPAP  . Arthritis   . History of chicken pox   . Hyperlipidemia   . Anemia   . Hypernatremia 06/03/2013  . Thrombocytopenia, unspecified 06/03/2013  . Leukopenia 06/03/2013  . Obstructive sleep apnea 06/03/2013  . Urinary  incontinence 06/03/2013  . Atrial fibrillation   . Hyperglycemia 12/21/2013  . Pancytopenia 06/03/2013    Past Surgical History  Procedure Laterality Date  . Coronary angioplasty with stent placement    . Vastectomy    . Insert / replace / remove pacemaker  04/07/2013    History   Social History  . Marital Status: Married    Spouse Name: N/A    Number of Children: 3  . Years of Education: N/A   Occupational History  .      Retired   Social History Main Topics  . Smoking status: Former Smoker    Quit date: 12/05/1991  . Smokeless tobacco: Never Used  . Alcohol Use: Yes     Comment: Occasional  . Drug Use: No  . Sexual Activity: Not on file   Other Topics Concern  . Not on file   Social History Narrative  . No narrative on file    ROS: Fatigue and cough but no fevers or chills, hemoptysis, dysphasia, odynophagia, melena, hematochezia, dysuria, hematuria, rash, seizure activity, claudication. Remaining systems are negative.  Physical Exam: Well-developed well-nourished in no acute distress.  Skin is warm and dry.  HEENT is normal.  Neck is supple.  Chest is clear to auscultation with normal expansion.  Cardiovascular exam is regular rate and rhythm.  Abdominal exam nontender or distended. No masses palpated. Extremities show trace edema. neuro grossly intact  ECG AV paced rhythm.

## 2014-06-03 NOTE — Assessment & Plan Note (Signed)
Followed by electrophysiology. 

## 2014-06-03 NOTE — Assessment & Plan Note (Signed)
Blood pressure elevated. Increase Cozaar to 50 mg daily and follow.

## 2014-06-03 NOTE — Assessment & Plan Note (Signed)
Patient remains in sinus rhythm. Continue beta blocker and apixaban. 

## 2014-06-03 NOTE — Assessment & Plan Note (Signed)
Continue statin. Not on aspirin given need for anticoagulation. 

## 2014-06-08 ENCOUNTER — Other Ambulatory Visit: Payer: Self-pay | Admitting: Family Medicine

## 2014-06-08 ENCOUNTER — Telehealth: Payer: Self-pay | Admitting: Family Medicine

## 2014-06-08 DIAGNOSIS — J309 Allergic rhinitis, unspecified: Secondary | ICD-10-CM

## 2014-06-08 NOTE — Telephone Encounter (Signed)
Please inform patient

## 2014-06-08 NOTE — Telephone Encounter (Signed)
I have placed referral to ENT for evaluation if they do not have  A good answer will try allergist next

## 2014-06-08 NOTE — Telephone Encounter (Signed)
Patient states that he has rhinitis which causes him to have constant mucous flow and it makes it hard for him to use his cpap machine. He would like to be referred to someone who specifically specializes in rhinitis.

## 2014-06-08 NOTE — Telephone Encounter (Signed)
Left detailed message informing patient of this. °

## 2014-06-10 ENCOUNTER — Ambulatory Visit (HOSPITAL_BASED_OUTPATIENT_CLINIC_OR_DEPARTMENT_OTHER)
Admission: RE | Admit: 2014-06-10 | Discharge: 2014-06-10 | Disposition: A | Discharge status: Home / Self Care | Payer: Medicare Other | Source: Ambulatory Visit | Attending: Cardiology | Admitting: Cardiology

## 2014-06-10 DIAGNOSIS — J9819 Other pulmonary collapse: Secondary | ICD-10-CM | POA: Insufficient documentation

## 2014-06-10 DIAGNOSIS — I509 Heart failure, unspecified: Secondary | ICD-10-CM

## 2014-06-10 DIAGNOSIS — J90 Pleural effusion, not elsewhere classified: Secondary | ICD-10-CM | POA: Insufficient documentation

## 2014-06-10 DIAGNOSIS — R0602 Shortness of breath: Secondary | ICD-10-CM

## 2014-06-10 LAB — BASIC METABOLIC PANEL WITH GFR
BUN: 22 mg/dL (ref 6–23)
CHLORIDE: 103 meq/L (ref 96–112)
CO2: 26 meq/L (ref 19–32)
Calcium: 8.7 mg/dL (ref 8.4–10.5)
Creat: 1.21 mg/dL (ref 0.50–1.35)
GFR, Est African American: 65 mL/min
GFR, Est Non African American: 57 mL/min — ABNORMAL LOW
Glucose, Bld: 114 mg/dL — ABNORMAL HIGH (ref 70–99)
Potassium: 4.1 mEq/L (ref 3.5–5.3)
Sodium: 137 mEq/L (ref 135–145)

## 2014-06-11 ENCOUNTER — Other Ambulatory Visit: Payer: Self-pay | Admitting: *Deleted

## 2014-06-11 DIAGNOSIS — I509 Heart failure, unspecified: Secondary | ICD-10-CM

## 2014-06-11 DIAGNOSIS — R0602 Shortness of breath: Secondary | ICD-10-CM

## 2014-06-11 LAB — CBC
HEMATOCRIT: 28.2 % — AB (ref 39.0–52.0)
Hemoglobin: 9.8 g/dL — ABNORMAL LOW (ref 13.0–17.0)
MCH: 31.2 pg (ref 26.0–34.0)
MCHC: 34.8 g/dL (ref 30.0–36.0)
MCV: 89.8 fL (ref 78.0–100.0)
Platelets: 83 10*3/uL — ABNORMAL LOW (ref 150–400)
RBC: 3.14 MIL/uL — ABNORMAL LOW (ref 4.22–5.81)
RDW: 13.9 % (ref 11.5–15.5)
WBC: 5 10*3/uL (ref 4.0–10.5)

## 2014-06-11 LAB — BRAIN NATRIURETIC PEPTIDE: Brain Natriuretic Peptide: 307 pg/mL — ABNORMAL HIGH (ref 0.0–100.0)

## 2014-06-11 MED ORDER — FUROSEMIDE 20 MG PO TABS: 60.0000 mg | ORAL_TABLET | Freq: Every day | ORAL | Status: AC

## 2014-06-15 ENCOUNTER — Telehealth: Payer: Self-pay

## 2014-06-15 NOTE — Telephone Encounter (Signed)
The patient has been notified that labs are due

## 2014-06-15 NOTE — Telephone Encounter (Signed)
Message copied by Barron Alvine on Mon Jun 15, 2014  8:17 AM ------      Message from: Barron Alvine      Created: Fri May 15, 2014  9:39 AM       Pt to get labs see procedure report  ------

## 2014-06-22 ENCOUNTER — Telehealth: Payer: Self-pay | Admitting: Gastroenterology

## 2014-06-22 LAB — BASIC METABOLIC PANEL WITH GFR
BUN: 31 mg/dL — ABNORMAL HIGH (ref 6–23)
CO2: 28 meq/L (ref 19–32)
Calcium: 8.4 mg/dL (ref 8.4–10.5)
Chloride: 103 mEq/L (ref 96–112)
Creat: 1.38 mg/dL — ABNORMAL HIGH (ref 0.50–1.35)
GFR, Est African American: 56 mL/min — ABNORMAL LOW
GFR, Est Non African American: 48 mL/min — ABNORMAL LOW
GLUCOSE: 122 mg/dL — AB (ref 70–99)
POTASSIUM: 3.7 meq/L (ref 3.5–5.3)
SODIUM: 136 meq/L (ref 135–145)

## 2014-06-22 NOTE — Telephone Encounter (Signed)
Spoke with Justin [name], he was supposed to have a follow-up CBC one month following his ECL with Dr. Ardis Hughs and if HGB has dropped possible capsule endo. Justin [name] had CBC 06/10/14 with cardiologist and it had dropped slightly. Justin [name] states he sees his cardiologist 06/24/14 and also hematologist 06/24/14. Justin [name] states in the last week or so his stool has changed to a firm brown instead of sticky black stool. Justin [name] wants to wait on a CBC for Dr. Ardis Hughs since he will have more labs later this week. Dr. Ardis Hughs notified.

## 2014-06-22 NOTE — Telephone Encounter (Signed)
Results discussed with pt. Note sent back to Dr. Ardis Hughs to make him aware.

## 2014-06-22 NOTE — Telephone Encounter (Signed)
No answer no voice mail  

## 2014-06-23 ENCOUNTER — Telehealth: Payer: Self-pay | Admitting: Hematology & Oncology

## 2014-06-23 NOTE — Telephone Encounter (Signed)
Pt aware.

## 2014-06-23 NOTE — Telephone Encounter (Signed)
Spoke w NEW PATIENT wife Britt Boozer) today to remind them of their appointment with Dr. Marin Olp. Also, advised them to bring all medication bottles and insurance card information.

## 2014-06-23 NOTE — Telephone Encounter (Signed)
Ok, please have him contact our office after her cardiology and hematology appts to discuss plan.

## 2014-06-24 ENCOUNTER — Encounter (HOSPITAL_COMMUNITY): Payer: Self-pay | Admitting: Pharmacy Technician

## 2014-06-24 ENCOUNTER — Other Ambulatory Visit (HOSPITAL_BASED_OUTPATIENT_CLINIC_OR_DEPARTMENT_OTHER): Payer: Medicare Other | Admitting: Lab

## 2014-06-24 ENCOUNTER — Ambulatory Visit (INDEPENDENT_AMBULATORY_CARE_PROVIDER_SITE_OTHER): Payer: Medicare Other | Admitting: Cardiology

## 2014-06-24 ENCOUNTER — Ambulatory Visit: Payer: Medicare Other

## 2014-06-24 ENCOUNTER — Encounter: Payer: Self-pay | Admitting: Hematology & Oncology

## 2014-06-24 ENCOUNTER — Encounter: Payer: Self-pay | Admitting: *Deleted

## 2014-06-24 ENCOUNTER — Other Ambulatory Visit: Payer: Self-pay | Admitting: Cardiology

## 2014-06-24 ENCOUNTER — Encounter: Payer: Self-pay | Admitting: Cardiology

## 2014-06-24 ENCOUNTER — Ambulatory Visit (HOSPITAL_BASED_OUTPATIENT_CLINIC_OR_DEPARTMENT_OTHER): Payer: Medicare Other | Admitting: Hematology & Oncology

## 2014-06-24 VITALS — BP 136/53 | HR 74 | Temp 98.4°F | Resp 18 | Ht 70.0 in | Wt 185.0 lb

## 2014-06-24 VITALS — BP 112/48 | HR 72 | Ht 71.0 in | Wt 184.1 lb

## 2014-06-24 DIAGNOSIS — D631 Anemia in chronic kidney disease: Secondary | ICD-10-CM

## 2014-06-24 DIAGNOSIS — D500 Iron deficiency anemia secondary to blood loss (chronic): Secondary | ICD-10-CM

## 2014-06-24 DIAGNOSIS — D61818 Other pancytopenia: Secondary | ICD-10-CM

## 2014-06-24 DIAGNOSIS — I509 Heart failure, unspecified: Secondary | ICD-10-CM

## 2014-06-24 DIAGNOSIS — I2584 Coronary atherosclerosis due to calcified coronary lesion: Secondary | ICD-10-CM

## 2014-06-24 DIAGNOSIS — I4891 Unspecified atrial fibrillation: Secondary | ICD-10-CM

## 2014-06-24 DIAGNOSIS — I059 Rheumatic mitral valve disease, unspecified: Secondary | ICD-10-CM

## 2014-06-24 DIAGNOSIS — I679 Cerebrovascular disease, unspecified: Secondary | ICD-10-CM

## 2014-06-24 DIAGNOSIS — I2510 Atherosclerotic heart disease of native coronary artery without angina pectoris: Secondary | ICD-10-CM

## 2014-06-24 DIAGNOSIS — I5031 Acute diastolic (congestive) heart failure: Secondary | ICD-10-CM

## 2014-06-24 DIAGNOSIS — N039 Chronic nephritic syndrome with unspecified morphologic changes: Secondary | ICD-10-CM

## 2014-06-24 DIAGNOSIS — N189 Chronic kidney disease, unspecified: Secondary | ICD-10-CM

## 2014-06-24 DIAGNOSIS — Z950 Presence of cardiac pacemaker: Secondary | ICD-10-CM

## 2014-06-24 LAB — CBC WITH DIFFERENTIAL (CANCER CENTER ONLY)
BASO#: 0 10*3/uL (ref 0.0–0.2)
BASO%: 0.3 % (ref 0.0–2.0)
EOS%: 0.1 % (ref 0.0–7.0)
Eosinophils Absolute: 0 10*3/uL (ref 0.0–0.5)
HCT: 30.6 % — ABNORMAL LOW (ref 38.7–49.9)
HGB: 10 g/dL — ABNORMAL LOW (ref 13.0–17.1)
LYMPH#: 0.9 10*3/uL (ref 0.9–3.3)
LYMPH%: 13.1 % — AB (ref 14.0–48.0)
MCH: 31.3 pg (ref 28.0–33.4)
MCHC: 32.7 g/dL (ref 32.0–35.9)
MCV: 96 fL (ref 82–98)
MONO#: 0.7 10*3/uL (ref 0.1–0.9)
MONO%: 10.5 % (ref 0.0–13.0)
NEUT%: 76 % (ref 40.0–80.0)
NEUTROS ABS: 5.1 10*3/uL (ref 1.5–6.5)
PLATELETS: 38 10*3/uL — AB (ref 145–400)
RBC: 3.19 10*6/uL — ABNORMAL LOW (ref 4.20–5.70)
RDW: 13.7 % (ref 11.1–15.7)
WBC: 6.7 10*3/uL (ref 4.0–10.0)

## 2014-06-24 LAB — BASIC METABOLIC PANEL WITH GFR
BUN: 36 mg/dL — ABNORMAL HIGH (ref 6–23)
CALCIUM: 8.1 mg/dL — AB (ref 8.4–10.5)
CO2: 28 mEq/L (ref 19–32)
CREATININE: 1.35 mg/dL (ref 0.50–1.35)
Chloride: 99 mEq/L (ref 96–112)
GFR, EST AFRICAN AMERICAN: 57 mL/min — AB
GFR, EST NON AFRICAN AMERICAN: 50 mL/min — AB
Glucose, Bld: 130 mg/dL — ABNORMAL HIGH (ref 70–99)
Potassium: 3.7 mEq/L (ref 3.5–5.3)
Sodium: 134 mEq/L — ABNORMAL LOW (ref 135–145)

## 2014-06-24 LAB — CHCC SATELLITE - SMEAR

## 2014-06-24 LAB — CMP (CANCER CENTER ONLY)
ALK PHOS: 66 U/L (ref 26–84)
ALT(SGPT): 45 U/L (ref 10–47)
AST: 66 U/L — ABNORMAL HIGH (ref 11–38)
Albumin: 2.9 g/dL — ABNORMAL LOW (ref 3.3–5.5)
BILIRUBIN TOTAL: 1.1 mg/dL (ref 0.20–1.60)
BUN: 34 mg/dL — AB (ref 7–22)
CO2: 30 mEq/L (ref 18–33)
CREATININE: 1.5 mg/dL — AB (ref 0.6–1.2)
Calcium: 8.8 mg/dL (ref 8.0–10.3)
Chloride: 98 mEq/L (ref 98–108)
GLUCOSE: 124 mg/dL — AB (ref 73–118)
Potassium: 3.4 mEq/L (ref 3.3–4.7)
SODIUM: 134 meq/L (ref 128–145)
Total Protein: 7.5 g/dL (ref 6.4–8.1)

## 2014-06-24 LAB — HEPATIC FUNCTION PANEL
ALBUMIN: 3 g/dL — AB (ref 3.5–5.2)
ALK PHOS: 65 U/L (ref 39–117)
ALT: 40 U/L (ref 0–53)
AST: 63 U/L — AB (ref 0–37)
Bilirubin, Direct: 0.3 mg/dL (ref 0.0–0.3)
Indirect Bilirubin: 0.7 mg/dL (ref 0.2–1.2)
TOTAL PROTEIN: 6.7 g/dL (ref 6.0–8.3)
Total Bilirubin: 1 mg/dL (ref 0.2–1.2)

## 2014-06-24 LAB — TECHNOLOGIST REVIEW CHCC SATELLITE

## 2014-06-24 NOTE — Assessment & Plan Note (Signed)
Continue statin. Not on aspirin given need for anticoagulation. 

## 2014-06-24 NOTE — Assessment & Plan Note (Addendum)
For hematology evaluation today. I will order SPEP, UPEP and free kappa light chain today.

## 2014-06-24 NOTE — Assessment & Plan Note (Signed)
Managed electrophysiology.

## 2014-06-24 NOTE — Progress Notes (Signed)
HPI: FU coronary artery disease and atrial fibrillation. The patient had a previous LAD infarct and had PCI of his LAD in April of 1999. Carotid Dopplers in April of 2014 showed 0-39% bilateral stenosis. Followup recommended in 2 years. Last nuclear study in April of 2014 showed no scar or ischemia. There was possible atypical flutter in recovery. Abdominal ultrasound in May 2014 showed no aneurysm. Previous monitor showed symptomatic 2:1 AV block and paroxysmal atrial fibrillation. Patient had a pacemaker placed. Patient seen recently for increased dyspnea. Chest x-ray June 2015 showed bilateral pleural effusions and vascular congestion. Echocardiogram in June of 2015 showed normal LV function, grade 2 diastolic dysfunction, mild aortic insufficiency, moderate mitral regurgitation, mild left atrial enlargement. Laboratories in June of 2015 showed a BNP of 330. BUN 26 and creatinine 1.34. Hemoglobin 10.4 and platelet count 99,000. Note he apparently has had an EGD and colonoscopy for anemia which has been essentially unrevealing. He may require capsule endoscopy. He has an appointment to see hematology for anemia/thrombocytopenia. TSH in May 2015 normal. We increased his Lasix at last office visit. Since that time Patient continues to have fatigue. He has some dyspnea on exertion but no orthopnea. His pedal edema persists. No chest pain or syncope.   Current Outpatient Prescriptions  Medication Sig Dispense Refill  . apixaban (ELIQUIS) 5 MG TABS tablet Take 1 tablet (5 mg total) by mouth 2 (two) times daily.  180 tablet  3  . atorvastatin (LIPITOR) 10 MG tablet Take 1 tablet (10 mg total) by mouth every evening.  90 tablet  3  . Azelaic Acid (FINACEA) 15 % cream Apply 1 application topically daily. After skin is thoroughly washed and patted dry, gently but thoroughly massage a thin film of azelaic acid creame      . Cholecalciferol (VITAMIN D-3) 1000 UNITS CAPS Take 1 capsule by mouth daily.        . Ferrous Fumarate-Folic Acid (HEMOCYTE-F) 324-1 MG TABS Take 1 tablet by mouth daily.  30 each  3  . fluticasone (FLONASE) 50 MCG/ACT nasal spray Place 1 spray into the nose 2 (two) times daily. For seasonal allergies      . furosemide (LASIX) 20 MG tablet Take 3 tablets (60 mg total) by mouth daily.  90 tablet  12  . losartan (COZAAR) 50 MG tablet Take 1 tablet (50 mg total) by mouth daily.  90 tablet  3  . Multiple Vitamin (MULTIVITAMIN WITH MINERALS) TABS Take 1 tablet by mouth 2 (two) times a week.       . nadolol (CORGARD) 20 MG tablet TAKE ONE HALF TABLET ONCE DAILY  90 tablet  4  . nitroGLYCERIN (NITROSTAT) 0.4 MG SL tablet Place 1 tablet (0.4 mg total) under the tongue every 5 (five) minutes as needed for chest pain. x3 doses as needed for chest pain  25 tablet  12  . omeprazole (PRILOSEC) 20 MG capsule Take 20 mg by mouth as needed.      . Probiotic Product (PROBIOTIC DAILY PO) Take 1 capsule by mouth daily.       Marland Kitchen pyridOXINE (VITAMIN B-6) 100 MG tablet Take 100 mg by mouth every evening.      . tamsulosin (FLOMAX) 0.4 MG CAPS capsule Take 0.4 mg by mouth at bedtime.      . vitamin B-12 (CYANOCOBALAMIN) 1000 MCG tablet Take 1,000 mcg by mouth every evening.       No current facility-administered medications for this visit.  Past Medical History  Diagnosis Date  . CAD (coronary artery disease) prior stenting 1999   . Dyslipidemia   . HTN (hypertension)   . WPW (Wolff-Parkinson-White syndrome) loss of preexcitation   . Atrial fibrillation   . AV block, Mobitz II   . Presyncope   . Myocardial infarction   . Pacemaker 04/07/2013  . Sleep apnea     USES CPAP  . Arthritis   . History of chicken pox   . Hyperlipidemia   . Anemia   . Hypernatremia 06/03/2013  . Thrombocytopenia, unspecified 06/03/2013  . Leukopenia 06/03/2013  . Obstructive sleep apnea 06/03/2013  . Urinary incontinence 06/03/2013  . Atrial fibrillation   . Hyperglycemia 12/21/2013  . Pancytopenia 06/03/2013     Past Surgical History  Procedure Laterality Date  . Coronary angioplasty with stent placement    . Vastectomy    . Insert / replace / remove pacemaker  04/07/2013    History   Social History  . Marital Status: Married    Spouse Name: N/A    Number of Children: 3  . Years of Education: N/A   Occupational History  .      Retired   Social History Main Topics  . Smoking status: Former Smoker    Quit date: 12/05/1991  . Smokeless tobacco: Never Used  . Alcohol Use: Yes     Comment: Occasional  . Drug Use: No  . Sexual Activity: Not on file   Other Topics Concern  . Not on file   Social History Narrative  . No narrative on file    ROS: Pedal edema and fatigue but no fevers or chills, productive cough, hemoptysis, dysphasia, odynophagia, melena, hematochezia, dysuria, hematuria, rash, seizure activity, orthopnea, PND, claudication. Remaining systems are negative.  Physical Exam: Well-developed well-nourished in no acute distress.  Skin is warm and dry.  HEENT is normal.  Neck is supple.  Chest is clear to auscultation with normal expansion.  Cardiovascular exam is regular rate and rhythm. 2/6 systolic murmur left sternal border. Abdominal exam nontender or distended. No masses palpated. Extremities show 1+ edema. neuro grossly intact

## 2014-06-24 NOTE — Patient Instructions (Signed)
Your physician recommends that you schedule a follow-up appointment in: 07-15-2014  Your physician recommends that you have lab work today  Your physician has requested that you have a TEE. During a TEE, sound waves are used to create images of your heart. It provides your doctor with information about the size and shape of your heart and how well your heart's chambers and valves are working. In this test, a transducer is attached to the end of a flexible tube that's guided down your throat and into your esophagus (the tube leading from you mouth to your stomach) to get a more detailed image of your heart. You are not awake for the procedure. Please see the instruction sheet given to you today. For further information please visit [email or web address].     Transesophageal Echocardiogram Transesophageal echocardiography (TEE) is a special type of test that produces images of the heart by using sound waves (echocardiogram). This type of echocardiography can obtain better images of the heart than standard echocardiography. TEE is done by passing a flexible tube down the esophagus. The heart is located in front of the esophagus. Because the heart and esophagus are close to one another, your health care provider can take very clear, detailed pictures of the heart via ultrasound waves. TEE may be done:  If your health care provider needs more information based on standard echocardiography findings.  If you had a stroke. This might have happened because a clot formed in your heart. TEE can visualize different areas of the heart and check for clots.  To check valve anatomy and function.  To check for infection on the inside of your heart (endocarditis).  To evaluate the dividing wall (septum) of the heart and presence of a hole that did not close after birth (patent foramen ovale or atrial septal defect).  To help diagnose a tear in the wall of the aorta (aortic dissection).  During cardiac valve  surgery. This allows the surgeon to assess the valve repair before closing the chest.  During a variety of other cardiac procedures to guide positioning of catheters.  Sometimes before a cardioversion, which is a shock to convert heart rhythm back to normal. LET [facility] CARE PROVIDER KNOW ABOUT:   Any allergies you have.  All medicines you are taking, including vitamins, herbs, eye drops, creams, and over-the-counter medicines.  Previous problems you or members of your family have had with the use of anesthetics.  Any blood disorders you have.  Previous surgeries you have had.  Medical conditions you have.  Swallowing difficulties.  An esophageal obstruction. RISKS AND COMPLICATIONS  Generally, TEE is a safe procedure. However, as with any procedure, complications can occur. Possible complications include an esophageal tear (rupture). BEFORE THE PROCEDURE   Do not eat or drink for 6 hours before the procedure or as directed by your health care provider.  Arrange for someone to drive you home after the procedure. Do not drive yourself home. During the procedure, you will be given medicines that can continue to make you feel drowsy and can impair your reflexes.  An IV access tube will be started in the arm. PROCEDURE   A medicine to help you relax (sedative) will be given through the IV access tube.  A medicine may be sprayed or gargled to numb the back of the throat.  Your blood pressure, heart rate, and breathing (vital signs) will be monitored during the procedure.  The TEE probe is a long, flexible tube. The tip of the  probe is placed into the back of the mouth, and you will be asked to swallow. This helps to pass the tip of the probe into the esophagus. Once the tip of the probe is in the correct area, your health care provider can take pictures of the heart.  TEE is usually not a painful procedure. You may feel the probe press against the back of the throat. The probe  does not enter the trachea and does not affect your breathing. AFTER THE PROCEDURE   You will be in bed, resting, until you have fully returned to consciousness.  When you first awaken, your throat may feel slightly sore and will probably still feel numb. This will improve slowly over time.  You will not be allowed to eat or drink until it is clear that the numbness has improved.  Once you have been able to drink, urinate, and sit on the edge of the bed without feeling sick to your stomach (nausea) or dizzy, you may be cleared to go home.  You should have a friend or family member with you for the next 24 hours after your procedure. Document Released: 02/10/2003 Document Revised: 11/25/2013 Document Reviewed: 05/22/2013 [facility] Patient Information 2015 [place], [place]. This information is not intended to replace advice given to you by your health care provider. Make sure you discuss any questions you have with your health care provider.

## 2014-06-24 NOTE — Assessment & Plan Note (Signed)
Patient remains in sinus rhythm on examination. Continue beta blocker and apixaban.

## 2014-06-24 NOTE — Assessment & Plan Note (Addendum)
Patient continues to have volume excess exam despite diuretics. His renal function has deteriorated mildly. I am not clear as to the etiology of his recent increased congestive heart failure. He does have grade 2 diastolic dysfunction on his echocardiogram with preserved LV function. He is noted to have moderate mitral regurgitation and mild aortic insufficiency. Question if MR has been under estimated by transthoracic echocardiogram. We'll plan transesophageal echocardiogram to further assess. Recheck hemoglobin as anemia may have contributed to CHF superimposed on diastolic dysfunction/mitral regurgitation. Check renal function and albumin. He may ultimately require left and right heart catheterization.  I will also arrange an evaluation by Dr. Haroldine Laws in [place] [facility]. I have told him I would be happy to arrange an evaluation at [facility] if they are so inclined. I will check UPEP SPEP and free kappa light chain to screen for amyloid.

## 2014-06-24 NOTE — Assessment & Plan Note (Signed)
Plan transesophageal echocardiogram to further assess.

## 2014-06-24 NOTE — Assessment & Plan Note (Signed)
Followup carotid Dopplers April 2016.

## 2014-06-24 NOTE — Assessment & Plan Note (Signed)
Continue statin. 

## 2014-06-24 NOTE — Assessment & Plan Note (Signed)
Blood pressure controlled. Continue present medications. 

## 2014-06-25 ENCOUNTER — Encounter: Payer: Self-pay | Admitting: Cardiology

## 2014-06-25 ENCOUNTER — Other Ambulatory Visit: Payer: Self-pay | Admitting: *Deleted

## 2014-06-25 LAB — CBC
HEMATOCRIT: 28.9 % — AB (ref 39.0–52.0)
HEMOGLOBIN: 9.9 g/dL — AB (ref 13.0–17.0)
MCH: 30.6 pg (ref 26.0–34.0)
MCHC: 34.3 g/dL (ref 30.0–36.0)
MCV: 89.2 fL (ref 78.0–100.0)
Platelets: 41 10*3/uL — ABNORMAL LOW (ref 150–400)
RBC: 3.24 MIL/uL — AB (ref 4.22–5.81)
RDW: 13.6 % (ref 11.5–15.5)
WBC: 6.6 10*3/uL (ref 4.0–10.5)

## 2014-06-25 LAB — FERRITIN CHCC: Ferritin: 398 ng/ml — ABNORMAL HIGH (ref 22–316)

## 2014-06-25 LAB — IRON AND TIBC CHCC
%SAT: 12 % — AB (ref 20–55)
IRON: 32 ug/dL — AB (ref 42–163)
TIBC: 259 ug/dL (ref 202–409)
UIBC: 227 ug/dL (ref 117–376)

## 2014-06-25 NOTE — Telephone Encounter (Signed)
This encounter was created in error - please disregard.

## 2014-06-25 NOTE — Progress Notes (Signed)
Referral MD  Reason for Referral: Anemia and thrombocytopenia   Chief Complaint  Patient presents with  . NEW PATIENT  : My blood is low. I am very tired.  HPI: [name] is a nice 80 year old gentleman. He is followed by cardiology and internal medicine. He has a pacemaker in. He has history of atrial fibrillation. He is on ELIQUIS.  He also has sleep apnea.  He apparently had GI bleeding back in May. He had a GI evaluation with upper and lower endoscopy. This was negative. He has not yet had a capsule endoscopy. He had melena. He says this is improving.  His blood count has gone down. He said that his blood counts back in February of this year were pretty normal. His white cell count was 3.5. Hemoglobin 13.6. Platelets count was 124.  He now has had continued drop in his blood counts. In June, his white cell count 6.3. He will and 10.4. And platelet count 99,000. MCV was 92.  2 weeks ago, his hemoglobin was 9.8. Reticulocyte count 83,000.  He was referred to the [facility] to try to help with his dropping red cells and platelets.  He is not on any new medications. He's had no weight loss or weight gain. His appetite is down. He's had no rashes. He's had no bruises. He's had no nausea vomiting.  He has had past surgeries without any difficulties.  He is not a vegetarian. He smoked in the past but not for many years. He really does not have any alcohol use. He may have a drink on occasion.  There is no occupational exposures. He worked for a [employer] but in [employer].  He was count referred to the [facility].  He's had no fever. He's had no swollen glands. He's had no cough. He does get tired more frequently.            Past Medical History  Diagnosis Date  . CAD (coronary artery disease) prior stenting 1999   . Dyslipidemia   . HTN (hypertension)   . WPW (Wolff-Parkinson-White syndrome) loss of preexcitation   . Atrial fibrillation   . AV block,  Mobitz II   . Presyncope   . Myocardial infarction   . Pacemaker 04/07/2013  . Sleep apnea     USES CPAP  . Arthritis   . History of chicken pox   . Hyperlipidemia   . Anemia   . Hypernatremia 06/03/2013  . Thrombocytopenia, unspecified 06/03/2013  . Leukopenia 06/03/2013  . Obstructive sleep apnea 06/03/2013  . Urinary incontinence 06/03/2013  . Atrial fibrillation   . Hyperglycemia 12/21/2013  . Pancytopenia 06/03/2013  :  Past Surgical History  Procedure Laterality Date  . Coronary angioplasty with stent placement    . Vastectomy    . Insert / replace / remove pacemaker  04/07/2013  :  Current outpatient prescriptions:apixaban (ELIQUIS) 5 MG TABS tablet, Take 1 tablet (5 mg total) by mouth 2 (two) times daily., Disp: 180 tablet, Rfl: 3;  atorvastatin (LIPITOR) 10 MG tablet, Take 1 tablet (10 mg total) by mouth every evening., Disp: 90 tablet, Rfl: 3 Azelaic Acid (FINACEA) 15 % cream, Apply 1 application topically daily. After skin is thoroughly washed and patted dry, gently but thoroughly massage a thin film of azelaic acid creame, Disp: , Rfl: ;  Cholecalciferol (VITAMIN D-3) 1000 UNITS CAPS, Take 1 capsule by mouth daily., Disp: , Rfl: ;  fluticasone (FLONASE) 50 MCG/ACT nasal spray, Place 1 spray into the  nose 2 (two) times daily. For seasonal allergies, Disp: , Rfl:  furosemide (LASIX) 20 MG tablet, Take 3 tablets (60 mg total) by mouth daily., Disp: 90 tablet, Rfl: 12;  losartan (COZAAR) 50 MG tablet, Take 1 tablet (50 mg total) by mouth daily., Disp: 90 tablet, Rfl: 3;  Multiple Vitamin (MULTIVITAMIN WITH MINERALS) TABS, Take 1 tablet by mouth 2 (two) times a week. , Disp: , Rfl:  nitroGLYCERIN (NITROSTAT) 0.4 MG SL tablet, Place 1 tablet (0.4 mg total) under the tongue every 5 (five) minutes as needed for chest pain. x3 doses as needed for chest pain, Disp: 25 tablet, Rfl: 12;  omeprazole (PRILOSEC) 20 MG capsule, Take 20 mg by mouth daily as needed (for heartburn). , Disp: , Rfl: ;   Probiotic Product (PROBIOTIC DAILY PO), Take 1 capsule by mouth daily. , Disp: , Rfl:  pyridOXINE (VITAMIN B-6) 100 MG tablet, Take 100 mg by mouth every evening., Disp: , Rfl: ;  tamsulosin (FLOMAX) 0.4 MG CAPS capsule, Take 0.4 mg by mouth at bedtime., Disp: , Rfl: ;  vitamin B-12 (CYANOCOBALAMIN) 1000 MCG tablet, Take 1,000 mcg by mouth every evening., Disp: , Rfl: ;  nadolol (CORGARD) 20 MG tablet, Take 10 mg by mouth daily., Disp: , Rfl:  Polyethyl Glycol-Propyl Glycol (SYSTANE ULTRA OP), Apply 1 drop to eye 4 (four) times daily as needed (for dry eyes)., Disp: , Rfl: :  :  No Known Allergies:  Family History  Problem Relation Age of Onset  . Stroke Mother   . Hypertension Mother   . Stroke Sister   . Arthritis Sister     rheumatoid  . Heart attack Sister   . Anemia Sister   . Other Brother     tube put in aorta  . Anemia Brother   . Other Son     cortisone deficiency  . Arthritis Son   . Stroke Brother   . Alcohol abuse Brother   . Barrett's esophagus Son   . Colon cancer Maternal Grandmother   . Colon cancer Maternal Grandfather   :  History   Social History  . Marital Status: Married    Spouse Name: N/A    Number of Children: 3  . Years of Education: N/A   Occupational History  .      Retired   Social History Main Topics  . Smoking status: Former Smoker -- 1.00 packs/day for 36 years    Types: Cigarettes    Start date: 01/25/1955    Quit date: 12/05/1991  . Smokeless tobacco: Never Used     Comment: 6quit smoking 22 yeras ago  . Alcohol Use: Yes     Comment: Occasional  . Drug Use: No  . Sexual Activity: Not on file   Other Topics Concern  . Not on file   Social History Narrative  . No narrative on file  :  Pertinent items are noted in HPI.  Exam: @IPVITALS @  totally but well-nourished white gentleman in no obvious distress. Vital signs show temperature of 98.4. Pulse 74. Blood pressure 136/53. Weight is 185 pounds. Head and neck exam shows no  ocular or oral lesions. He has no scleritis. Has no adenopathy in the neck. He has no glossitis. There is no mucositis. Lungs are clear. Cardiac exam regular in rhythm. He has no obvious atrial fibrillation. He has a 1/6 systolic murmur. Abdomen is soft. Has good bowel sounds. There is no fluid wave. There is no palpable liver or spleen tip. Neck  exam shows no tenderness over the spine ribs or hips. Extremities shows some age related osteoarthritic changes. He has muscle atrophy in the upper and lower jammies. He has good muscle strength and tone. Skin exam no rashes. Neurological exam shows no focal neurological deficits.    Recent Labs  06/24/14 1320 06/24/14 1355  WBC 6.6 6.7  HGB 9.9* 10.0*  HCT 28.9* 30.6*  PLT 41* 38*    Recent Labs  06/24/14 1320 06/24/14 1355  NA 134* 134  K 3.7 3.4  CL 99 98  CO2 28 30  GLUCOSE 130* 124*  BUN 36* 34*  CREATININE 1.35 1.5*  CALCIUM 8.1* 8.8    Blood smear review: Normochromic and normocytic population of red blood cells. He has no nucleated red cells. I see no teardrop cells. He has no schistocytes or spherocytes. There are no target cells. White cells. Normal in morphology maturation. There are no hyper segmented polys. He has no immature myeloid lymphoid forms. I see no atypical lymphocytes. Platelets are decreased in number. Platelets are well granulated. He has numerous large platelets.  Pathology: No data     Assessment and Plan: Justin [name] is a 80 year old [place]. He has anemia and thrombocytopenia.  I think he has 2 separate problems. The platelet count is much more concerning to me. It is dropping relatively quickly. His platelet count was normal 5 months ago.  By the appearance of the blood smear, one would have to think that he has some type of distortion or some sequestration. I cannot feel his spleen so I would think that he may have some type of autoimmune process. This would be a little unusual at his age but yet not  impossible.  I think the anemia is probably iron deficient. He would though he does not have a low MCV, his iron studies back in June were iron deficient. He's on oral iron but I don't think he is doing well with this. He is likely not absorbing iron because of his Prilosec. In addition, he says the oral I really makes him have abdominal pain and cramps.  I do think there might be a component of erythropoietin deficiency. We are checking this.  Another possibility for the anemia is low testosterone. We are checking this.  Again, I am most worried about the thrombocytopenia. I think we're going to have to do a bone marrow test on him. I think this would be the simplest way for [place] to figure out what is going on with his platelets. I suppose that he could have underlying myelodysplasia.  I spoke to he and his wife for about an hour. I explained to them the problem. I showed him the lab work. I explained to them what platelets were. He is on ELIQUIS. I would not stop this for now even though he is thrombocytopenic. I still think he has a risk of thrombosing and have a potential CVA off ELIQUIS.  We will set him up for a bone marrow test next week. Once I have the results back, then we will be a will to figure out how to make adjustments.  He and his wife are very nice. I certainly had a good time talking with him.

## 2014-06-25 NOTE — Telephone Encounter (Signed)
CAndice from [employer] about an Alert on [employer] .Marland Kitchen

## 2014-06-26 ENCOUNTER — Telehealth: Payer: Self-pay

## 2014-06-26 LAB — RETICULOCYTES (CHCC)
ABS Retic: 71.5 10*3/uL (ref 19.0–186.0)
RBC.: 3.25 MIL/uL — ABNORMAL LOW (ref 4.22–5.81)
Retic Ct Pct: 2.2 % (ref 0.4–2.3)

## 2014-06-26 LAB — TESTOSTERONE: Testosterone: 172 ng/dL — ABNORMAL LOW (ref 300–890)

## 2014-06-26 LAB — VITAMIN B12: Vitamin B-12: 928 pg/mL — ABNORMAL HIGH (ref 211–911)

## 2014-06-26 LAB — LACTATE DEHYDROGENASE: LDH: 352 U/L — ABNORMAL HIGH (ref 94–250)

## 2014-06-26 LAB — ERYTHROPOIETIN: Erythropoietin: 37.9 m[IU]/mL — ABNORMAL HIGH (ref 2.6–18.5)

## 2014-06-26 NOTE — Telephone Encounter (Signed)
Central Scheduling called patient's TEE cancelled for 06/29/14 per Dr.Crenshaw.

## 2014-06-29 ENCOUNTER — Encounter (HOSPITAL_COMMUNITY): Admission: RE | Payer: Self-pay | Source: Ambulatory Visit

## 2014-06-29 ENCOUNTER — Ambulatory Visit (HOSPITAL_COMMUNITY): Admission: RE | Admit: 2014-06-29 | Payer: Medicare Other | Source: Ambulatory Visit | Admitting: Cardiology

## 2014-06-29 SURGERY — ECHOCARDIOGRAM, TRANSESOPHAGEAL
Anesthesia: Moderate Sedation

## 2014-06-30 ENCOUNTER — Encounter: Payer: Medicare Other | Admitting: Hematology & Oncology

## 2014-06-30 ENCOUNTER — Other Ambulatory Visit (HOSPITAL_COMMUNITY)
Admission: RE | Admit: 2014-06-30 | Discharge: 2014-06-30 | Disposition: A | Discharge status: Home / Self Care | Payer: Medicare Other | Source: Ambulatory Visit | Attending: Hematology & Oncology | Admitting: Hematology & Oncology

## 2014-06-30 ENCOUNTER — Other Ambulatory Visit: Payer: Medicare Other | Admitting: Lab

## 2014-06-30 ENCOUNTER — Ambulatory Visit (HOSPITAL_BASED_OUTPATIENT_CLINIC_OR_DEPARTMENT_OTHER): Payer: Medicare Other | Admitting: Hematology & Oncology

## 2014-06-30 VITALS — BP 113/48 | HR 64 | Temp 98.1°F | Resp 16

## 2014-06-30 DIAGNOSIS — D649 Anemia, unspecified: Secondary | ICD-10-CM | POA: Insufficient documentation

## 2014-06-30 DIAGNOSIS — D696 Thrombocytopenia, unspecified: Secondary | ICD-10-CM | POA: Diagnosis not present

## 2014-06-30 DIAGNOSIS — D72822 Plasmacytosis: Secondary | ICD-10-CM | POA: Diagnosis not present

## 2014-06-30 DIAGNOSIS — D61818 Other pancytopenia: Secondary | ICD-10-CM

## 2014-06-30 LAB — CBC
HCT: 29 % — ABNORMAL LOW (ref 39.0–52.0)
HEMOGLOBIN: 9.4 g/dL — AB (ref 13.0–17.0)
MCH: 30.8 pg (ref 26.0–34.0)
MCHC: 32.4 g/dL (ref 30.0–36.0)
MCV: 95.1 fL (ref 78.0–100.0)
Platelets: 26 10*3/uL — CL (ref 150–400)
RBC: 3.05 MIL/uL — ABNORMAL LOW (ref 4.22–5.81)
RDW: 14.6 % (ref 11.5–15.5)
WBC: 5.6 10*3/uL (ref 4.0–10.5)

## 2014-06-30 LAB — BONE MARROW EXAM

## 2014-06-30 NOTE — Progress Notes (Signed)
This is a bone marrow biopsy and aspirate procedure note for [employer].  This was done because of his anemia and thrombocytopenia.  He was brought to the treatment room at the cancer center. Apparently did the appropriate time out procedure.  He was placed onto his right side. We cleaned the left posterior iliac crest region in sterile fashion.  We used 2% lidocaine under the skin and down to the periosteum. We use 5 cc.  We then used a scalpel to make an incision into the skin.  I had my nurse practitioner Sarah assist. We used an aspirate needle. We obtained 2 aspirates.  With the Jamshidi biopsy needle, we then obtained a bone marrow biopsy cor. It was a low bit difficult to get the biopsy core because of his bones being a little bit soft. We finally did find a solid area that we could answered and we obtained a good specimen.  We cleaned and dressed the biopsy site sterilely.  He tolerated the procedure well. He is a very tough gentleman.  We obtained a very good specimens so that we could find out what is going on with his bone marrow.

## 2014-07-01 ENCOUNTER — Encounter: Payer: Self-pay | Admitting: Hematology & Oncology

## 2014-07-02 ENCOUNTER — Encounter: Payer: Self-pay | Admitting: Hematology & Oncology

## 2014-07-02 ENCOUNTER — Other Ambulatory Visit: Payer: Self-pay | Admitting: Hematology & Oncology

## 2014-07-02 DIAGNOSIS — K909 Intestinal malabsorption, unspecified: Secondary | ICD-10-CM | POA: Insufficient documentation

## 2014-07-02 DIAGNOSIS — D509 Iron deficiency anemia, unspecified: Secondary | ICD-10-CM

## 2014-07-02 DIAGNOSIS — D693 Immune thrombocytopenic purpura: Secondary | ICD-10-CM

## 2014-07-02 DIAGNOSIS — E349 Endocrine disorder, unspecified: Secondary | ICD-10-CM

## 2014-07-02 HISTORY — DX: Intestinal malabsorption, unspecified: K90.9

## 2014-07-02 HISTORY — DX: Iron deficiency anemia, unspecified: D50.9

## 2014-07-02 HISTORY — DX: Endocrine disorder, unspecified: E34.9

## 2014-07-02 HISTORY — DX: Immune thrombocytopenic purpura: D69.3

## 2014-07-03 ENCOUNTER — Ambulatory Visit (HOSPITAL_BASED_OUTPATIENT_CLINIC_OR_DEPARTMENT_OTHER): Payer: Medicare Other

## 2014-07-03 ENCOUNTER — Encounter: Payer: Self-pay | Admitting: Hematology & Oncology

## 2014-07-03 ENCOUNTER — Ambulatory Visit (HOSPITAL_BASED_OUTPATIENT_CLINIC_OR_DEPARTMENT_OTHER): Payer: Medicare Other | Admitting: Hematology & Oncology

## 2014-07-03 ENCOUNTER — Telehealth: Payer: Self-pay | Admitting: Hematology & Oncology

## 2014-07-03 ENCOUNTER — Other Ambulatory Visit (HOSPITAL_BASED_OUTPATIENT_CLINIC_OR_DEPARTMENT_OTHER): Payer: Medicare Other | Admitting: Lab

## 2014-07-03 ENCOUNTER — Other Ambulatory Visit: Payer: Self-pay | Admitting: *Deleted

## 2014-07-03 VITALS — BP 127/51 | HR 81 | Temp 98.6°F | Resp 18 | Ht 70.0 in | Wt 181.0 lb

## 2014-07-03 DIAGNOSIS — D693 Immune thrombocytopenic purpura: Secondary | ICD-10-CM

## 2014-07-03 DIAGNOSIS — D509 Iron deficiency anemia, unspecified: Secondary | ICD-10-CM

## 2014-07-03 DIAGNOSIS — E349 Endocrine disorder, unspecified: Secondary | ICD-10-CM

## 2014-07-03 DIAGNOSIS — K909 Intestinal malabsorption, unspecified: Secondary | ICD-10-CM

## 2014-07-03 DIAGNOSIS — E291 Testicular hypofunction: Secondary | ICD-10-CM

## 2014-07-03 LAB — CBC WITH DIFFERENTIAL (CANCER CENTER ONLY)
BASO#: 0 10*3/uL (ref 0.0–0.2)
BASO%: 0.3 % (ref 0.0–2.0)
EOS%: 0.4 % (ref 0.0–7.0)
Eosinophils Absolute: 0 10*3/uL (ref 0.0–0.5)
HEMATOCRIT: 28.7 % — AB (ref 38.7–49.9)
HGB: 9.4 g/dL — ABNORMAL LOW (ref 13.0–17.1)
LYMPH#: 0.9 10*3/uL (ref 0.9–3.3)
LYMPH%: 11.6 % — ABNORMAL LOW (ref 14.0–48.0)
MCH: 31.4 pg (ref 28.0–33.4)
MCHC: 32.8 g/dL (ref 32.0–35.9)
MCV: 96 fL (ref 82–98)
MONO#: 0.7 10*3/uL (ref 0.1–0.9)
MONO%: 9.5 % (ref 0.0–13.0)
NEUT#: 5.8 10*3/uL (ref 1.5–6.5)
NEUT%: 78.2 % (ref 40.0–80.0)
Platelets: 14 10*3/uL — ABNORMAL LOW (ref 145–400)
RBC: 2.99 10*6/uL — ABNORMAL LOW (ref 4.20–5.70)
RDW: 14.5 % (ref 11.1–15.7)
WBC: 7.4 10*3/uL (ref 4.0–10.0)

## 2014-07-03 MED ORDER — METHYLPREDNISOLONE SODIUM SUCC 125 MG IJ SOLR
125.0000 mg | Freq: Once | INTRAMUSCULAR | Status: AC
  Administered 2014-07-03: 125 mg via INTRAVENOUS

## 2014-07-03 MED ORDER — SODIUM CHLORIDE 0.9 % IV SOLN
Freq: Once | INTRAVENOUS | Status: AC
  Administered 2014-07-03: 13:00:00 via INTRAVENOUS

## 2014-07-03 MED ORDER — SODIUM CHLORIDE 0.9 % IV SOLN: Freq: Once | INTRAVENOUS | Status: DC

## 2014-07-03 MED ORDER — TESTOSTERONE CYPIONATE 200 MG/ML IM SOLN
400.0000 mg | INTRAMUSCULAR | Status: DC
  Administered 2014-07-03: 400 mg via INTRAMUSCULAR

## 2014-07-03 MED ORDER — PREDNISONE 20 MG PO TABS: ORAL_TABLET | ORAL | Status: DC

## 2014-07-03 MED ORDER — TESTOSTERONE CYPIONATE 200 MG/ML IM SOLN
INTRAMUSCULAR | Status: AC
  Filled 2014-07-03: qty 2

## 2014-07-03 MED ORDER — SODIUM CHLORIDE 0.9 % IV SOLN
1020.0000 mg | Freq: Once | INTRAVENOUS | Status: AC
  Administered 2014-07-03: 1020 mg via INTRAVENOUS
  Filled 2014-07-03: qty 34

## 2014-07-03 NOTE — Telephone Encounter (Signed)
Per MD since we are doing lab on 8-3 he doesn't need another until 67-17

## 2014-07-03 NOTE — Progress Notes (Signed)
Hematology and Oncology Follow Up Visit  Justin [name] [id number] 03/15/1933 81 y.o. 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  [name] is back for followup. We did go ahead and do a bone marrow biopsy on him. We did this because his platelet count is going down fairly quickly. The bone marrow biopsy was done on July 28. The pathology report ([id number]) showed a mildly hypercellular bone marrow. There were about 5-10% plasma cells. There was no monoclonal population of cells. There were adequate megakaryocytes. There is no dysplastic changes. There is no known leukemic abnormality. [place] red stain was negative for amyloid.  His  FISH panel so for is NEGATIVE for myeloma.  Cytogenetics are pending.  Iron studies show a ferritin of 398, but iron saturation of 12%. Vit B12 is 928. Testosterone is 172.  Epo is 38.  I had him come in today so we could try to get started on improving his blood counts.  He's had no bleeding. He's had no bruising.  His appetite is doing okay. He's had no change in bowel or bladder habits. There's been no melena or bright red blood per rectum. He has had no emesis. He's had no epistaxis. There is no headache. He's had no visual changes.  Overall, his performance status is ECOG 2    Medications: Current outpatient prescriptions:atorvastatin (LIPITOR) 10 MG tablet, Take 1 tablet (10 mg total) by mouth every evening., Disp: 90 tablet, Rfl: 3;  Azelaic Acid (FINACEA) 15 % cream, Apply 1 application topically daily. After skin is thoroughly washed and patted dry, gently but thoroughly massage a thin film of azelaic acid creame, Disp: , Rfl: ;  Cholecalciferol (VITAMIN D-3) 1000 UNITS CAPS, Take 1 capsule by  mouth daily., Disp: , Rfl:  fluticasone (FLONASE) 50 MCG/ACT nasal spray, Place 1 spray into the nose 2 (two) times daily. For seasonal allergies, Disp: , Rfl: ;  furosemide (LASIX) 20 MG tablet, Take 3 tablets (60 mg total) by mouth daily., Disp: 90 tablet, Rfl: 12;  losartan (COZAAR) 50 MG tablet, Take 1 tablet (50 mg total) by mouth daily., Disp: 90 tablet, Rfl: 3 Multiple Vitamin (MULTIVITAMIN WITH MINERALS) TABS, Take 1 tablet by mouth 2 (two) times a week. , Disp: , Rfl: ;  nadolol (CORGARD) 20 MG tablet, Take 10 mg by mouth daily., Disp: , Rfl: ;  nitroGLYCERIN (NITROSTAT) 0.4 MG SL tablet, Place 1 tablet (0.4 mg total) under the tongue every 5 (five) minutes as needed for chest pain. x3 doses as needed for chest pain, Disp: 25 tablet, Rfl: 12 Polyethyl Glycol-Propyl Glycol (SYSTANE ULTRA OP), Apply 1 drop to eye 4 (four) times daily as needed (for dry eyes)., Disp: , Rfl: ;  Probiotic Product (PROBIOTIC DAILY PO), Take 1 capsule by mouth daily. , Disp: , Rfl: ;  pyridOXINE (VITAMIN B-6) 100 MG tablet, Take 100 mg by mouth every evening., Disp: , Rfl: ;  tamsulosin (FLOMAX) 0.4 MG CAPS capsule, Take 0.4 mg by mouth at bedtime., Disp: , Rfl:  vitamin B-12 (CYANOCOBALAMIN) 1000 MCG tablet, Take 1,000 mcg by mouth every evening., Disp: , Rfl: ;  omeprazole (PRILOSEC) 20 MG capsule, Take 20 mg by mouth daily as needed (for heartburn). , Disp: ,   Rfl: ;  predniSONE (DELTASONE) 20 MG tablet, Take 4 pills a day with food, Disp: 120 tablet, Rfl: 3 No current facility-administered medications for this visit. Facility-Administered Medications Ordered in Other Visits: 0.9 %  sodium chloride infusion, , Intravenous, Once, Peter R Ennever, MD;  testosterone cypionate (DEPOTESTOTERONE CYPIONATE) injection 400 mg, 400 mg, Intramuscular, Q28 days, Peter R Ennever, MD, 400 mg at 07/03/14 1254  Allergies: No Known Allergies  Past Medical History, Surgical history, Social history, and Family History were reviewed and  updated.  Review of Systems: As above  Physical Exam:  height is 5' 10" (1.778 m) and weight is 181 lb (82.101 kg). His oral temperature is 98.6 F (37 C). His blood pressure is 127/51 and his pulse is 81. His respiration is 18.   Elderly white woman in no obvious distress. Head and neck exam shows no ocular or oral lesion. He has no palpable cervical or supraclavicular lymph nodes. Lungs are clear. Cardiac exam regular in rhythm with an occasional is to be. I do not hear any obvious atrial fibrillation. Abdomen is soft. He has no palpable liver or spleen tip. Back exam shows no tenderness over the spine ribs or hips. Extremities shows some mild pitting edema in his lower legs. Skin exam shows no rashes, ecchymosis or petechia. Neurological exam is nonfocal.  Lab Results  Component Value Date   WBC 7.4 07/03/2014   HGB 9.4* 07/03/2014   HCT 28.7* 07/03/2014   MCV 96 07/03/2014   PLT 14 Platelet count consistent in citrate* 07/03/2014     Chemistry      Component Value Date/Time   NA 134 06/24/2014 1355   NA 134* 06/24/2014 1320   K 3.4 06/24/2014 1355   K 3.7 06/24/2014 1320   CL 98 06/24/2014 1355   CL 99 06/24/2014 1320   CO2 30 06/24/2014 1355   CO2 28 06/24/2014 1320   BUN 34* 06/24/2014 1355   BUN 36* 06/24/2014 1320   CREATININE 1.5* 06/24/2014 1355   CREATININE 1.2 05/20/2013 0734      Component Value Date/Time   CALCIUM 8.8 06/24/2014 1355   CALCIUM 8.1* 06/24/2014 1320   ALKPHOS 66 06/24/2014 1355   ALKPHOS 65 06/24/2014 1320   AST 66* 06/24/2014 1355   AST 63* 06/24/2014 1320   ALT 45 06/24/2014 1355   ALT 40 06/24/2014 1320   BILITOT 1.10 06/24/2014 1355   BILITOT 1.0 06/24/2014 1320         Impression and Plan: Justin [name] is 81-year-old white gentleman. He has progressive thrombocytopenia. By his bone marrow, and this certainly appears to be autoimmune. As such, I think we have to try immune suppressive therapy. I will put him on some prednisone. I will start him off at 80 mg a  day. I told him to take this with food. I also told him to take it with the Prilosec that he has.  I think we have to try some IVIG. I think this would be reasonable. I would give him 2 days worth.  I don't think we have to try Nplate as of yet. However, this is an option for us.  I will give him a dose of testosterone today. Again I would think this can help.  We will also give him some iron. I do realize that iron he can cause platelets to decrease but he is anemic and again we have to try to get him to feel better.  We will get him back in   on August 3 for IVIG. I will check his blood count then.  I will give him a dose of Solu-Medrol today. The dose will be 125 mg. He will start the prednisone tomorrow.  We will check his blood counts weekly.  I spent about 45 minutes with he and his wife. I answered all their questions. I showed him the bone marrow report. I explained the bone marrow report and the bone marrow situation that I think he has.  I told him to make sure that he calls [place] if he notes any problems with bleeding or bruising.  Volanda Napoleon, MD 7/31/20151:50 PM

## 2014-07-03 NOTE — Patient Instructions (Signed)
Ferumoxytol injection What is this medicine? FERUMOXYTOL is an iron complex. Iron is used to make healthy red blood cells, which carry oxygen and nutrients throughout the body. This medicine is used to treat iron deficiency anemia in people with chronic kidney disease. This medicine may be used for other purposes; ask your health care provider or pharmacist if you have questions. COMMON BRAND NAME(S): Feraheme What should I tell my health care provider before I take this medicine? They need to know if you have any of these conditions: -anemia not caused by low iron levels -high levels of iron in the blood -magnetic resonance imaging (MRI) test scheduled -an unusual or allergic reaction to iron, other medicines, foods, dyes, or preservatives -pregnant or trying to get pregnant -breast-feeding How should I use this medicine? This medicine is for injection into a vein. It is given by a health care professional in a hospital or clinic setting. Talk to your pediatrician regarding the use of this medicine in children. Special care may be needed. Overdosage: If you think you've taken too much of this medicine contact a poison control center or emergency room at once. Overdosage: If you think you have taken too much of this medicine contact a poison control center or emergency room at once. NOTE: This medicine is only for you. Do not share this medicine with others. What if I miss a dose? It is important not to miss your dose. Call your doctor or health care professional if you are unable to keep an appointment. What may interact with this medicine? This medicine may interact with the following medications: -other iron products This list may not describe all possible interactions. Give your health care provider a list of all the medicines, herbs, non-prescription drugs, or dietary supplements you use. Also tell them if you smoke, drink alcohol, or use illegal drugs. Some items may interact with your  medicine. What should I watch for while using this medicine? Visit your doctor or healthcare professional regularly. Tell your doctor or healthcare professional if your symptoms do not start to get better or if they get worse. You may need blood work done while you are taking this medicine. You may need to follow a special diet. Talk to your doctor. Foods that contain iron include: whole grains/cereals, dried fruits, beans, or peas, leafy green vegetables, and organ meats (liver, kidney). What side effects may I notice from receiving this medicine? Side effects that you should report to your doctor or health care professional as soon as possible: -allergic reactions like skin rash, itching or hives, swelling of the face, lips, or tongue -breathing problems -changes in blood pressure -feeling faint or lightheaded, falls -fever or chills -flushing, sweating, or hot feelings -swelling of the ankles or feet Side effects that usually do not require medical attention (Report these to your doctor or health care professional if they continue or are bothersome.): -diarrhea -headache -nausea, vomiting -stomach pain This list may not describe all possible side effects. Call your doctor for medical advice about side effects. You may report side effects to FDA at 1-800-FDA-1088. Where should I keep my medicine? This drug is given in a hospital or clinic and will not be stored at home. NOTE: This sheet is a summary. It may not cover all possible information. If you have questions about this medicine, talk to your doctor, pharmacist, or health care provider.  2015, Elsevier/Gold Standard. (2012-07-05 15:23:36) Testosterone injection What is this medicine? TESTOSTERONE (tes TOS ter one) is the main   male hormone. It supports normal male development such as muscle growth, facial hair, and deep voice. It is used in males to treat low testosterone levels. This medicine may be used for other purposes; ask your  health care provider or pharmacist if you have questions. COMMON BRAND NAME(S): Andro-L.A., Aveed, Delatestryl, Depo-Testosterone, Virilon What should I tell my health care provider before I take this medicine? They need to know if you have any of these conditions: -breast cancer -diabetes -heart disease -kidney disease -liver disease -lung disease -prostate cancer, enlargement -an unusual or allergic reaction to testosterone, other medicines, foods, dyes, or preservatives -pregnant or trying to get pregnant -breast-feeding How should I use this medicine? This medicine is for injection into a muscle. It is usually given by a health care professional in a hospital or clinic setting. Contact your pediatrician regarding the use of this medicine in children. While this medicine may be prescribed for children as young as 12 years of age for selected conditions, precautions do apply. Overdosage: If you think you have taken too much of this medicine contact a poison control center or emergency room at once. NOTE: This medicine is only for you. Do not share this medicine with others. What if I miss a dose? Try not to miss a dose. Your doctor or health care professional will tell you when your next injection is due. Notify the office if you are unable to keep an appointment. What may interact with this medicine? -medicines for diabetes -medicines that treat or prevent blood clots like warfarin -oxyphenbutazone -propranolol -steroid medicines like prednisone or cortisone This list may not describe all possible interactions. Give your health care provider a list of all the medicines, herbs, non-prescription drugs, or dietary supplements you use. Also tell them if you smoke, drink alcohol, or use illegal drugs. Some items may interact with your medicine. What should I watch for while using this medicine? Visit your doctor or health care professional for regular checks on your progress. They will  need to check the level of testosterone in your blood. This medicine is only approved for use in men who have low levels of testosterone related to certain medical conditions. Heart attacks and strokes have been reported with the use of this medicine. Notify your doctor or health care professional and seek emergency treatment if you develop breathing problems; changes in vision; confusion; chest pain or chest tightness; sudden arm pain; severe, sudden headache; trouble speaking or understanding; sudden numbness or weakness of the face, arm or leg; loss of balance or coordination. Talk to your doctor about the risks and benefits of this medicine. This medicine may affect blood sugar levels. If you have diabetes, check with your doctor or health care professional before you change your diet or the dose of your diabetic medicine. This drug is banned from use in athletes by most athletic organizations. What side effects may I notice from receiving this medicine? Side effects that you should report to your doctor or health care professional as soon as possible: -allergic reactions like skin rash, itching or hives, swelling of the face, lips, or tongue -breast enlargement -breathing problems -changes in mood, especially anger, depression, or rage -dark urine -general ill feeling or flu-like symptoms -light-colored stools -loss of appetite, nausea -nausea, vomiting -right upper belly pain -stomach pain -swelling of ankles -too frequent or persistent erections -trouble passing urine or change in the amount of urine -unusually weak or tired -yellowing of the eyes or skin Additional side effects that   can occur in women include: -deep or hoarse voice -facial hair growth -irregular menstrual periods Side effects that usually do not require medical attention (report to your doctor or health care professional if they continue or are bothersome): -acne -change in sex drive or performance -hair  loss -headache This list may not describe all possible side effects. Call your doctor for medical advice about side effects. You may report side effects to FDA at 1-800-FDA-1088. Where should I keep my medicine? Keep out of the reach of children. This medicine can be abused. Keep your medicine in a safe place to protect it from theft. Do not share this medicine with anyone. Selling or giving away this medicine is dangerous and against the law. Store at room temperature between 20 and 25 degrees C (68 and 77 degrees F). Do not freeze. Protect from light. Follow the directions for the product you are prescribed. Throw away any unused medicine after the expiration date. NOTE: This sheet is a summary. It may not cover all possible information. If you have questions about this medicine, talk to your doctor, pharmacist, or health care provider.  2015, Elsevier/Gold Standard. (2014-02-05 08:27:24)  

## 2014-07-06 ENCOUNTER — Emergency Department (HOSPITAL_BASED_OUTPATIENT_CLINIC_OR_DEPARTMENT_OTHER): Payer: Medicare Other

## 2014-07-06 ENCOUNTER — Ambulatory Visit (HOSPITAL_BASED_OUTPATIENT_CLINIC_OR_DEPARTMENT_OTHER)
Admission: RE | Admit: 2014-07-06 | Discharge: 2014-07-06 | Disposition: A | Discharge status: Home / Self Care | Payer: Medicare Other | Source: Ambulatory Visit | Attending: Hematology & Oncology | Admitting: Hematology & Oncology

## 2014-07-06 ENCOUNTER — Other Ambulatory Visit: Payer: Self-pay

## 2014-07-06 ENCOUNTER — Inpatient Hospital Stay (HOSPITAL_BASED_OUTPATIENT_CLINIC_OR_DEPARTMENT_OTHER)
Admission: EM | Admit: 2014-07-06 | Discharge: 2014-09-03 | DRG: 853 | Disposition: E | Payer: Medicare Other | Attending: Thoracic Surgery (Cardiothoracic Vascular Surgery) | Admitting: Thoracic Surgery (Cardiothoracic Vascular Surgery)

## 2014-07-06 ENCOUNTER — Encounter (HOSPITAL_BASED_OUTPATIENT_CLINIC_OR_DEPARTMENT_OTHER): Payer: Self-pay

## 2014-07-06 ENCOUNTER — Ambulatory Visit (HOSPITAL_BASED_OUTPATIENT_CLINIC_OR_DEPARTMENT_OTHER): Payer: Medicare Other

## 2014-07-06 ENCOUNTER — Other Ambulatory Visit (HOSPITAL_BASED_OUTPATIENT_CLINIC_OR_DEPARTMENT_OTHER): Payer: Medicare Other | Admitting: Lab

## 2014-07-06 VITALS — BP 120/59 | HR 81 | Temp 96.5°F | Resp 18

## 2014-07-06 DIAGNOSIS — Z8679 Personal history of other diseases of the circulatory system: Secondary | ICD-10-CM

## 2014-07-06 DIAGNOSIS — D696 Thrombocytopenia, unspecified: Secondary | ICD-10-CM | POA: Diagnosis present

## 2014-07-06 DIAGNOSIS — J9600 Acute respiratory failure, unspecified whether with hypoxia or hypercapnia: Secondary | ICD-10-CM | POA: Diagnosis not present

## 2014-07-06 DIAGNOSIS — I509 Heart failure, unspecified: Secondary | ICD-10-CM | POA: Diagnosis present

## 2014-07-06 DIAGNOSIS — D508 Other iron deficiency anemias: Secondary | ICD-10-CM

## 2014-07-06 DIAGNOSIS — I959 Hypotension, unspecified: Secondary | ICD-10-CM

## 2014-07-06 DIAGNOSIS — Z87891 Personal history of nicotine dependence: Secondary | ICD-10-CM

## 2014-07-06 DIAGNOSIS — M129 Arthropathy, unspecified: Secondary | ICD-10-CM | POA: Diagnosis present

## 2014-07-06 DIAGNOSIS — D509 Iron deficiency anemia, unspecified: Secondary | ICD-10-CM

## 2014-07-06 DIAGNOSIS — Z515 Encounter for palliative care: Secondary | ICD-10-CM | POA: Diagnosis not present

## 2014-07-06 DIAGNOSIS — R509 Fever, unspecified: Secondary | ICD-10-CM

## 2014-07-06 DIAGNOSIS — N390 Urinary tract infection, site not specified: Secondary | ICD-10-CM

## 2014-07-06 DIAGNOSIS — I248 Other forms of acute ischemic heart disease: Secondary | ICD-10-CM | POA: Diagnosis present

## 2014-07-06 DIAGNOSIS — I76 Septic arterial embolism: Secondary | ICD-10-CM

## 2014-07-06 DIAGNOSIS — I480 Paroxysmal atrial fibrillation: Secondary | ICD-10-CM

## 2014-07-06 DIAGNOSIS — I340 Nonrheumatic mitral (valve) insufficiency: Secondary | ICD-10-CM | POA: Diagnosis present

## 2014-07-06 DIAGNOSIS — IMO0002 Reserved for concepts with insufficient information to code with codable children: Secondary | ICD-10-CM | POA: Diagnosis not present

## 2014-07-06 DIAGNOSIS — I456 Pre-excitation syndrome: Secondary | ICD-10-CM | POA: Diagnosis present

## 2014-07-06 DIAGNOSIS — R34 Anuria and oliguria: Secondary | ICD-10-CM | POA: Diagnosis not present

## 2014-07-06 DIAGNOSIS — D649 Anemia, unspecified: Secondary | ICD-10-CM

## 2014-07-06 DIAGNOSIS — N183 Chronic kidney disease, stage 3 unspecified: Secondary | ICD-10-CM | POA: Diagnosis not present

## 2014-07-06 DIAGNOSIS — N186 End stage renal disease: Secondary | ICD-10-CM | POA: Diagnosis not present

## 2014-07-06 DIAGNOSIS — R5381 Other malaise: Secondary | ICD-10-CM | POA: Diagnosis present

## 2014-07-06 DIAGNOSIS — I079 Rheumatic tricuspid valve disease, unspecified: Secondary | ICD-10-CM | POA: Diagnosis present

## 2014-07-06 DIAGNOSIS — I2789 Other specified pulmonary heart diseases: Secondary | ICD-10-CM | POA: Diagnosis present

## 2014-07-06 DIAGNOSIS — E785 Hyperlipidemia, unspecified: Secondary | ICD-10-CM

## 2014-07-06 DIAGNOSIS — I442 Atrioventricular block, complete: Secondary | ICD-10-CM

## 2014-07-06 DIAGNOSIS — I252 Old myocardial infarction: Secondary | ICD-10-CM

## 2014-07-06 DIAGNOSIS — E872 Acidosis, unspecified: Secondary | ICD-10-CM | POA: Diagnosis present

## 2014-07-06 DIAGNOSIS — I080 Rheumatic disorders of both mitral and aortic valves: Secondary | ICD-10-CM | POA: Diagnosis present

## 2014-07-06 DIAGNOSIS — D693 Immune thrombocytopenic purpura: Secondary | ICD-10-CM | POA: Diagnosis present

## 2014-07-06 DIAGNOSIS — Z66 Do not resuscitate: Secondary | ICD-10-CM | POA: Diagnosis not present

## 2014-07-06 DIAGNOSIS — I330 Acute and subacute infective endocarditis: Secondary | ICD-10-CM | POA: Diagnosis present

## 2014-07-06 DIAGNOSIS — R404 Transient alteration of awareness: Secondary | ICD-10-CM | POA: Diagnosis not present

## 2014-07-06 DIAGNOSIS — I6340 Cerebral infarction due to embolism of unspecified cerebral artery: Secondary | ICD-10-CM | POA: Diagnosis not present

## 2014-07-06 DIAGNOSIS — E119 Type 2 diabetes mellitus without complications: Secondary | ICD-10-CM | POA: Diagnosis not present

## 2014-07-06 DIAGNOSIS — D689 Coagulation defect, unspecified: Secondary | ICD-10-CM | POA: Diagnosis not present

## 2014-07-06 DIAGNOSIS — R4701 Aphasia: Secondary | ICD-10-CM | POA: Diagnosis not present

## 2014-07-06 DIAGNOSIS — G9340 Encephalopathy, unspecified: Secondary | ICD-10-CM | POA: Diagnosis present

## 2014-07-06 DIAGNOSIS — J3489 Other specified disorders of nose and nasal sinuses: Secondary | ICD-10-CM | POA: Diagnosis not present

## 2014-07-06 DIAGNOSIS — R0602 Shortness of breath: Secondary | ICD-10-CM | POA: Diagnosis present

## 2014-07-06 DIAGNOSIS — A419 Sepsis, unspecified organism: Secondary | ICD-10-CM

## 2014-07-06 DIAGNOSIS — Z823 Family history of stroke: Secondary | ICD-10-CM | POA: Diagnosis not present

## 2014-07-06 DIAGNOSIS — Z9889 Other specified postprocedural states: Secondary | ICD-10-CM

## 2014-07-06 DIAGNOSIS — I2489 Other forms of acute ischemic heart disease: Secondary | ICD-10-CM | POA: Diagnosis present

## 2014-07-06 DIAGNOSIS — I441 Atrioventricular block, second degree: Secondary | ICD-10-CM

## 2014-07-06 DIAGNOSIS — R0682 Tachypnea, not elsewhere classified: Secondary | ICD-10-CM

## 2014-07-06 DIAGNOSIS — B49 Unspecified mycosis: Secondary | ICD-10-CM | POA: Diagnosis not present

## 2014-07-06 DIAGNOSIS — Z952 Presence of prosthetic heart valve: Secondary | ICD-10-CM

## 2014-07-06 DIAGNOSIS — I059 Rheumatic mitral valve disease, unspecified: Secondary | ICD-10-CM

## 2014-07-06 DIAGNOSIS — N170 Acute kidney failure with tubular necrosis: Secondary | ICD-10-CM | POA: Diagnosis present

## 2014-07-06 DIAGNOSIS — Z9861 Coronary angioplasty status: Secondary | ICD-10-CM | POA: Diagnosis not present

## 2014-07-06 DIAGNOSIS — I5033 Acute on chronic diastolic (congestive) heart failure: Secondary | ICD-10-CM | POA: Diagnosis present

## 2014-07-06 DIAGNOSIS — E46 Unspecified protein-calorie malnutrition: Secondary | ICD-10-CM | POA: Diagnosis present

## 2014-07-06 DIAGNOSIS — Z950 Presence of cardiac pacemaker: Secondary | ICD-10-CM | POA: Diagnosis not present

## 2014-07-06 DIAGNOSIS — K219 Gastro-esophageal reflux disease without esophagitis: Secondary | ICD-10-CM | POA: Diagnosis present

## 2014-07-06 DIAGNOSIS — A408 Other streptococcal sepsis: Secondary | ICD-10-CM

## 2014-07-06 DIAGNOSIS — I10 Essential (primary) hypertension: Secondary | ICD-10-CM

## 2014-07-06 DIAGNOSIS — E876 Hypokalemia: Secondary | ICD-10-CM | POA: Diagnosis not present

## 2014-07-06 DIAGNOSIS — I5032 Chronic diastolic (congestive) heart failure: Secondary | ICD-10-CM

## 2014-07-06 DIAGNOSIS — I5030 Unspecified diastolic (congestive) heart failure: Secondary | ICD-10-CM

## 2014-07-06 DIAGNOSIS — I771 Stricture of artery: Secondary | ICD-10-CM | POA: Diagnosis present

## 2014-07-06 DIAGNOSIS — G4733 Obstructive sleep apnea (adult) (pediatric): Secondary | ICD-10-CM

## 2014-07-06 DIAGNOSIS — R197 Diarrhea, unspecified: Secondary | ICD-10-CM | POA: Diagnosis not present

## 2014-07-06 DIAGNOSIS — E2749 Other adrenocortical insufficiency: Secondary | ICD-10-CM

## 2014-07-06 DIAGNOSIS — J90 Pleural effusion, not elsewhere classified: Secondary | ICD-10-CM | POA: Diagnosis present

## 2014-07-06 DIAGNOSIS — I6529 Occlusion and stenosis of unspecified carotid artery: Secondary | ICD-10-CM | POA: Diagnosis present

## 2014-07-06 DIAGNOSIS — E871 Hypo-osmolality and hyponatremia: Secondary | ICD-10-CM | POA: Diagnosis not present

## 2014-07-06 DIAGNOSIS — D469 Myelodysplastic syndrome, unspecified: Secondary | ICD-10-CM

## 2014-07-06 DIAGNOSIS — N189 Chronic kidney disease, unspecified: Secondary | ICD-10-CM

## 2014-07-06 DIAGNOSIS — Z79899 Other long term (current) drug therapy: Secondary | ICD-10-CM | POA: Diagnosis not present

## 2014-07-06 DIAGNOSIS — D631 Anemia in chronic kidney disease: Secondary | ICD-10-CM

## 2014-07-06 DIAGNOSIS — Z953 Presence of xenogenic heart valve: Secondary | ICD-10-CM

## 2014-07-06 DIAGNOSIS — R6521 Severe sepsis with septic shock: Secondary | ICD-10-CM

## 2014-07-06 DIAGNOSIS — Z954 Presence of other heart-valve replacement: Secondary | ICD-10-CM

## 2014-07-06 DIAGNOSIS — Z951 Presence of aortocoronary bypass graft: Secondary | ICD-10-CM

## 2014-07-06 DIAGNOSIS — T502X5A Adverse effect of carbonic-anhydrase inhibitors, benzothiadiazides and other diuretics, initial encounter: Secondary | ICD-10-CM | POA: Diagnosis present

## 2014-07-06 DIAGNOSIS — Z23 Encounter for immunization: Secondary | ICD-10-CM

## 2014-07-06 DIAGNOSIS — I679 Cerebrovascular disease, unspecified: Secondary | ICD-10-CM

## 2014-07-06 DIAGNOSIS — K909 Intestinal malabsorption, unspecified: Secondary | ICD-10-CM

## 2014-07-06 DIAGNOSIS — E349 Endocrine disorder, unspecified: Secondary | ICD-10-CM

## 2014-07-06 DIAGNOSIS — G458 Other transient cerebral ischemic attacks and related syndromes: Secondary | ICD-10-CM

## 2014-07-06 DIAGNOSIS — I495 Sick sinus syndrome: Secondary | ICD-10-CM | POA: Diagnosis present

## 2014-07-06 DIAGNOSIS — R011 Cardiac murmur, unspecified: Secondary | ICD-10-CM | POA: Diagnosis not present

## 2014-07-06 DIAGNOSIS — R7881 Bacteremia: Secondary | ICD-10-CM

## 2014-07-06 DIAGNOSIS — A409 Streptococcal sepsis, unspecified: Principal | ICD-10-CM | POA: Diagnosis present

## 2014-07-06 DIAGNOSIS — N179 Acute kidney failure, unspecified: Secondary | ICD-10-CM

## 2014-07-06 DIAGNOSIS — B952 Enterococcus as the cause of diseases classified elsewhere: Secondary | ICD-10-CM

## 2014-07-06 DIAGNOSIS — Y841 Kidney dialysis as the cause of abnormal reaction of the patient, or of later complication, without mention of misadventure at the time of the procedure: Secondary | ICD-10-CM | POA: Diagnosis not present

## 2014-07-06 DIAGNOSIS — I38 Endocarditis, valve unspecified: Secondary | ICD-10-CM

## 2014-07-06 DIAGNOSIS — T82898A Other specified complication of vascular prosthetic devices, implants and grafts, initial encounter: Secondary | ICD-10-CM | POA: Diagnosis not present

## 2014-07-06 DIAGNOSIS — R778 Other specified abnormalities of plasma proteins: Secondary | ICD-10-CM

## 2014-07-06 DIAGNOSIS — I669 Occlusion and stenosis of unspecified cerebral artery: Secondary | ICD-10-CM

## 2014-07-06 DIAGNOSIS — J9601 Acute respiratory failure with hypoxia: Secondary | ICD-10-CM

## 2014-07-06 DIAGNOSIS — I2510 Atherosclerotic heart disease of native coronary artery without angina pectoris: Secondary | ICD-10-CM | POA: Diagnosis present

## 2014-07-06 DIAGNOSIS — R739 Hyperglycemia, unspecified: Secondary | ICD-10-CM

## 2014-07-06 DIAGNOSIS — D62 Acute posthemorrhagic anemia: Secondary | ICD-10-CM | POA: Diagnosis not present

## 2014-07-06 DIAGNOSIS — I25110 Atherosclerotic heart disease of native coronary artery with unstable angina pectoris: Secondary | ICD-10-CM

## 2014-07-06 DIAGNOSIS — I5023 Acute on chronic systolic (congestive) heart failure: Secondary | ICD-10-CM

## 2014-07-06 DIAGNOSIS — E861 Hypovolemia: Secondary | ICD-10-CM | POA: Diagnosis present

## 2014-07-06 DIAGNOSIS — T827XXS Infection and inflammatory reaction due to other cardiac and vascular devices, implants and grafts, sequela: Secondary | ICD-10-CM

## 2014-07-06 DIAGNOSIS — I120 Hypertensive chronic kidney disease with stage 5 chronic kidney disease or end stage renal disease: Secondary | ICD-10-CM | POA: Diagnosis present

## 2014-07-06 DIAGNOSIS — R5383 Other fatigue: Secondary | ICD-10-CM

## 2014-07-06 DIAGNOSIS — I498 Other specified cardiac arrhythmias: Secondary | ICD-10-CM

## 2014-07-06 DIAGNOSIS — I4891 Unspecified atrial fibrillation: Secondary | ICD-10-CM | POA: Diagnosis present

## 2014-07-06 DIAGNOSIS — I351 Nonrheumatic aortic (valve) insufficiency: Secondary | ICD-10-CM

## 2014-07-06 DIAGNOSIS — R443 Hallucinations, unspecified: Secondary | ICD-10-CM | POA: Diagnosis not present

## 2014-07-06 DIAGNOSIS — R6520 Severe sepsis without septic shock: Secondary | ICD-10-CM

## 2014-07-06 DIAGNOSIS — R600 Localized edema: Secondary | ICD-10-CM

## 2014-07-06 DIAGNOSIS — R7989 Other specified abnormal findings of blood chemistry: Secondary | ICD-10-CM

## 2014-07-06 DIAGNOSIS — I482 Chronic atrial fibrillation, unspecified: Secondary | ICD-10-CM

## 2014-07-06 DIAGNOSIS — J202 Acute bronchitis due to streptococcus: Secondary | ICD-10-CM

## 2014-07-06 DIAGNOSIS — G459 Transient cerebral ischemic attack, unspecified: Secondary | ICD-10-CM

## 2014-07-06 HISTORY — DX: Presence of other heart-valve replacement: Z95.4

## 2014-07-06 HISTORY — DX: Personal history of other diseases of the circulatory system: Z86.79

## 2014-07-06 HISTORY — DX: Presence of aortocoronary bypass graft: Z95.1

## 2014-07-06 HISTORY — DX: Acute on chronic diastolic (congestive) heart failure: I50.33

## 2014-07-06 HISTORY — DX: Presence of xenogenic heart valve: Z95.3

## 2014-07-06 HISTORY — DX: Other specified postprocedural states: Z98.890

## 2014-07-06 HISTORY — DX: Nonrheumatic aortic (valve) insufficiency: I35.1

## 2014-07-06 HISTORY — DX: Pleural effusion, not elsewhere classified: J90

## 2014-07-06 HISTORY — DX: Urinary tract infection, site not specified: N39.0

## 2014-07-06 HISTORY — DX: Chronic kidney disease, stage 3 (moderate): N18.3

## 2014-07-06 HISTORY — DX: Presence of prosthetic heart valve: Z95.2

## 2014-07-06 HISTORY — DX: Acute and subacute infective endocarditis: I33.0

## 2014-07-06 HISTORY — DX: Acute kidney failure, unspecified: N17.9

## 2014-07-06 HISTORY — DX: Chronic kidney disease, unspecified: N18.9

## 2014-07-06 LAB — PROTIME-INR
INR: 1.48 (ref 0.00–1.49)
PROTHROMBIN TIME: 17.9 s — AB (ref 11.6–15.2)

## 2014-07-06 LAB — CBC WITH DIFFERENTIAL/PLATELET
Band Neutrophils: 7 % (ref 0–10)
Basophils Absolute: 0 10*3/uL (ref 0.0–0.1)
Basophils Relative: 0 % (ref 0–1)
Eosinophils Absolute: 0 10*3/uL (ref 0.0–0.7)
Eosinophils Relative: 0 % (ref 0–5)
HCT: 30.4 % — ABNORMAL LOW (ref 39.0–52.0)
HEMOGLOBIN: 9.8 g/dL — AB (ref 13.0–17.0)
LYMPHS ABS: 0.7 10*3/uL (ref 0.7–4.0)
Lymphocytes Relative: 5 % — ABNORMAL LOW (ref 12–46)
MCH: 31.1 pg (ref 26.0–34.0)
MCHC: 32.2 g/dL (ref 30.0–36.0)
MCV: 96.5 fL (ref 78.0–100.0)
MONO ABS: 0.3 10*3/uL (ref 0.1–1.0)
MYELOCYTES: 2 %
Monocytes Relative: 2 % — ABNORMAL LOW (ref 3–12)
Neutro Abs: 12.4 10*3/uL — ABNORMAL HIGH (ref 1.7–7.7)
Neutrophils Relative %: 84 % — ABNORMAL HIGH (ref 43–77)
Platelets: 8 10*3/uL — CL (ref 150–400)
RBC: 3.15 MIL/uL — ABNORMAL LOW (ref 4.22–5.81)
RDW: 15.5 % (ref 11.5–15.5)
WBC: 13.4 10*3/uL — ABNORMAL HIGH (ref 4.0–10.5)

## 2014-07-06 LAB — COMPREHENSIVE METABOLIC PANEL
ALBUMIN: 2.6 g/dL — AB (ref 3.5–5.2)
ALK PHOS: 106 U/L (ref 39–117)
ALT: 39 U/L (ref 0–53)
AST: 77 U/L — AB (ref 0–37)
Anion gap: 13 (ref 5–15)
BUN: 45 mg/dL — ABNORMAL HIGH (ref 6–23)
CHLORIDE: 99 meq/L (ref 96–112)
CO2: 24 meq/L (ref 19–32)
CREATININE: 1.5 mg/dL — AB (ref 0.50–1.35)
Calcium: 8.9 mg/dL (ref 8.4–10.5)
GFR calc Af Amer: 49 mL/min — ABNORMAL LOW (ref >90)
GFR calc non Af Amer: 43 mL/min — ABNORMAL LOW (ref >90)
Glucose, Bld: 130 mg/dL — ABNORMAL HIGH (ref 70–99)
Potassium: 4.6 mEq/L (ref 3.7–5.3)
Sodium: 136 mEq/L — ABNORMAL LOW (ref 137–147)
Total Bilirubin: 1.7 mg/dL — ABNORMAL HIGH (ref 0.3–1.2)
Total Protein: 7.8 g/dL (ref 6.0–8.3)

## 2014-07-06 LAB — URINALYSIS, ROUTINE W REFLEX MICROSCOPIC
BILIRUBIN URINE: NEGATIVE
GLUCOSE, UA: NEGATIVE mg/dL
KETONES UR: NEGATIVE mg/dL
Nitrite: NEGATIVE
PH: 5 (ref 5.0–8.0)
PROTEIN: 100 mg/dL — AB
Specific Gravity, Urine: 1.017 (ref 1.005–1.030)
Urobilinogen, UA: 1 mg/dL (ref 0.0–1.0)

## 2014-07-06 LAB — URINE MICROSCOPIC-ADD ON

## 2014-07-06 LAB — CBC WITH DIFFERENTIAL (CANCER CENTER ONLY)
BASO#: 0 10*3/uL (ref 0.0–0.2)
BASO%: 0.1 % (ref 0.0–2.0)
EOS%: 0 % (ref 0.0–7.0)
Eosinophils Absolute: 0 10*3/uL (ref 0.0–0.5)
HCT: 28.8 % — ABNORMAL LOW (ref 38.7–49.9)
HGB: 9.5 g/dL — ABNORMAL LOW (ref 13.0–17.1)
LYMPH#: 0.8 10*3/uL — AB (ref 0.9–3.3)
LYMPH%: 6 % — ABNORMAL LOW (ref 14.0–48.0)
MCH: 31.9 pg (ref 28.0–33.4)
MCHC: 33 g/dL (ref 32.0–35.9)
MCV: 97 fL (ref 82–98)
MONO#: 0.7 10*3/uL (ref 0.1–0.9)
MONO%: 4.8 % (ref 0.0–13.0)
NEUT#: 12.5 10*3/uL — ABNORMAL HIGH (ref 1.5–6.5)
NEUT%: 89.1 % — ABNORMAL HIGH (ref 40.0–80.0)
PLATELETS: 10 10*3/uL — AB (ref 145–400)
RBC: 2.98 10*6/uL — ABNORMAL LOW (ref 4.20–5.70)
RDW: 14.8 % (ref 11.1–15.7)
WBC: 14 10*3/uL — AB (ref 4.0–10.0)

## 2014-07-06 LAB — MRSA PCR SCREENING: MRSA by PCR: NEGATIVE

## 2014-07-06 LAB — ERYTHROPOIETIN: ERYTHROPOIETIN: 18 m[IU]/mL (ref 2.6–18.5)

## 2014-07-06 LAB — TROPONIN I
Troponin I: 0.68 ng/mL (ref <0.30)
Troponin I: 1.59 ng/mL (ref <0.30)

## 2014-07-06 LAB — I-STAT CG4 LACTIC ACID, ED: LACTIC ACID, VENOUS: 2.66 mmol/L — AB (ref 0.5–2.2)

## 2014-07-06 LAB — PRO B NATRIURETIC PEPTIDE: Pro B Natriuretic peptide (BNP): 12303 pg/mL — ABNORMAL HIGH (ref 0–450)

## 2014-07-06 LAB — GLUCOSE, CAPILLARY: Glucose-Capillary: 98 mg/dL (ref 70–99)

## 2014-07-06 LAB — MAGNESIUM: Magnesium: 2 mg/dL (ref 1.5–2.5)

## 2014-07-06 LAB — PROCALCITONIN: PROCALCITONIN: 59.31 ng/mL

## 2014-07-06 MED ORDER — CETYLPYRIDINIUM CHLORIDE 0.05 % MT LIQD
7.0000 mL | Freq: Two times a day (BID) | OROMUCOSAL | Status: DC
  Administered 2014-07-06 – 2014-07-12 (×12): 7 mL via OROMUCOSAL

## 2014-07-06 MED ORDER — MEPERIDINE HCL 25 MG/ML IJ SOLN
INTRAMUSCULAR | Status: AC
  Filled 2014-07-06: qty 1

## 2014-07-06 MED ORDER — DIPHENHYDRAMINE HCL 25 MG PO CAPS
ORAL_CAPSULE | ORAL | Status: AC
  Filled 2014-07-06: qty 1

## 2014-07-06 MED ORDER — VITAMIN B-6 100 MG PO TABS
100.0000 mg | ORAL_TABLET | Freq: Every evening | ORAL | Status: DC
  Administered 2014-07-06 – 2014-07-23 (×17): 100 mg via ORAL
  Filled 2014-07-06 (×19): qty 1

## 2014-07-06 MED ORDER — NOREPINEPHRINE BITARTRATE 1 MG/ML IV SOLN
2.0000 ug/min | INTRAVENOUS | Status: DC
  Filled 2014-07-06: qty 4

## 2014-07-06 MED ORDER — IOHEXOL 350 MG/ML SOLN
80.0000 mL | Freq: Once | INTRAVENOUS | Status: AC | PRN
  Administered 2014-07-06: 80 mL via INTRAVENOUS

## 2014-07-06 MED ORDER — LEVOFLOXACIN IN D5W 750 MG/150ML IV SOLN
750.0000 mg | INTRAVENOUS | Status: DC
  Administered 2014-07-07: 750 mg via INTRAVENOUS
  Filled 2014-07-06 (×2): qty 150

## 2014-07-06 MED ORDER — SODIUM CHLORIDE 0.9 % IV SOLN
INTRAVENOUS | Status: DC
  Administered 2014-07-06: 10:00:00 via INTRAVENOUS

## 2014-07-06 MED ORDER — VANCOMYCIN HCL IN DEXTROSE 1-5 GM/200ML-% IV SOLN
1000.0000 mg | Freq: Once | INTRAVENOUS | Status: AC
  Administered 2014-07-06: 1000 mg via INTRAVENOUS
  Filled 2014-07-06: qty 200

## 2014-07-06 MED ORDER — PIPERACILLIN-TAZOBACTAM 3.375 G IVPB
3.3750 g | Freq: Three times a day (TID) | INTRAVENOUS | Status: DC
  Administered 2014-07-07 – 2014-07-08 (×5): 3.375 g via INTRAVENOUS
  Filled 2014-07-06 (×7): qty 50

## 2014-07-06 MED ORDER — DIPHENHYDRAMINE HCL 25 MG PO CAPS
25.0000 mg | ORAL_CAPSULE | Freq: Once | ORAL | Status: AC
  Administered 2014-07-06: 25 mg via ORAL

## 2014-07-06 MED ORDER — SODIUM CHLORIDE 0.9 % IV SOLN: 250.0000 mL | INTRAVENOUS | Status: DC | PRN

## 2014-07-06 MED ORDER — VANCOMYCIN HCL IN DEXTROSE 750-5 MG/150ML-% IV SOLN
750.0000 mg | Freq: Two times a day (BID) | INTRAVENOUS | Status: DC
  Administered 2014-07-06 – 2014-07-11 (×9): 750 mg via INTRAVENOUS
  Filled 2014-07-06 (×10): qty 150

## 2014-07-06 MED ORDER — SODIUM CHLORIDE 0.9 % IV SOLN: INTRAVENOUS | Status: DC

## 2014-07-06 MED ORDER — ATORVASTATIN CALCIUM 10 MG PO TABS
10.0000 mg | ORAL_TABLET | Freq: Every evening | ORAL | Status: DC
  Administered 2014-07-06: 10 mg via ORAL
  Filled 2014-07-06 (×2): qty 1

## 2014-07-06 MED ORDER — SODIUM CHLORIDE 0.9 % IV BOLUS (SEPSIS)
1000.0000 mL | Freq: Once | INTRAVENOUS | Status: AC
  Administered 2014-07-06 (×2): 1000 mL via INTRAVENOUS

## 2014-07-06 MED ORDER — PIPERACILLIN-TAZOBACTAM 3.375 G IVPB 30 MIN
3.3750 g | Freq: Once | INTRAVENOUS | Status: AC
  Administered 2014-07-06: 3.375 g via INTRAVENOUS
  Filled 2014-07-06 (×2): qty 50

## 2014-07-06 MED ORDER — SODIUM CHLORIDE 0.9 % IV BOLUS (SEPSIS)
2000.0000 mL | Freq: Once | INTRAVENOUS | Status: AC
  Administered 2014-07-06: 2000 mL via INTRAVENOUS

## 2014-07-06 MED ORDER — MEPERIDINE HCL 25 MG/ML IJ SOLN
12.5000 mg | Freq: Once | INTRAMUSCULAR | Status: AC
  Administered 2014-07-06: 12.5 mg via INTRAVENOUS

## 2014-07-06 MED ORDER — ACETAMINOPHEN 500 MG PO TABS
1000.0000 mg | ORAL_TABLET | Freq: Once | ORAL | Status: AC
  Administered 2014-07-06: 1000 mg via ORAL
  Filled 2014-07-06: qty 2

## 2014-07-06 MED ORDER — NOREPINEPHRINE BITARTRATE 1 MG/ML IV SOLN
2.0000 ug/min | Freq: Once | INTRAVENOUS | Status: AC
  Administered 2014-07-06: 5 ug/min via INTRAVENOUS
  Filled 2014-07-06: qty 4

## 2014-07-06 MED ORDER — IMMUNE GLOBULIN (HUMAN) 20 GM/200ML IV SOLN
80.0000 g | Freq: Once | INTRAVENOUS | Status: AC
Start: 2014-07-06 — End: 2014-07-06
  Administered 2014-07-06: 80 g via INTRAVENOUS
  Filled 2014-07-06: qty 800

## 2014-07-06 MED ORDER — SODIUM CHLORIDE 0.9 % IV BOLUS (SEPSIS): 500.0000 mL | Freq: Once | INTRAVENOUS | Status: DC

## 2014-07-06 MED ORDER — ACETAMINOPHEN 325 MG PO TABS: 650.0000 mg | ORAL_TABLET | ORAL | Status: DC | PRN

## 2014-07-06 MED ORDER — SODIUM CHLORIDE 0.9 % IV BOLUS (SEPSIS)
500.0000 mL | Freq: Once | INTRAVENOUS | Status: AC
  Administered 2014-07-06: 17:00:00 via INTRAVENOUS

## 2014-07-06 MED ORDER — PANTOPRAZOLE SODIUM 40 MG PO TBEC
40.0000 mg | DELAYED_RELEASE_TABLET | Freq: Every day | ORAL | Status: DC
  Administered 2014-07-06 – 2014-07-23 (×17): 40 mg via ORAL
  Filled 2014-07-06 (×16): qty 1

## 2014-07-06 MED ORDER — SODIUM CHLORIDE 0.9 % IV SOLN
1000.0000 mL | INTRAVENOUS | Status: DC
  Administered 2014-07-06: 1000 mL via INTRAVENOUS

## 2014-07-06 MED ORDER — VITAMIN B-12 1000 MCG PO TABS
1000.0000 ug | ORAL_TABLET | Freq: Every evening | ORAL | Status: DC
  Administered 2014-07-06 – 2014-07-10 (×5): 1000 ug via ORAL
  Filled 2014-07-06 (×6): qty 1

## 2014-07-06 MED ORDER — HYDROCORTISONE NA SUCCINATE PF 100 MG IJ SOLR
50.0000 mg | Freq: Four times a day (QID) | INTRAMUSCULAR | Status: DC
  Administered 2014-07-07 – 2014-07-08 (×2): 50 mg via INTRAVENOUS
  Filled 2014-07-06 (×6): qty 1

## 2014-07-06 NOTE — ED Notes (Signed)
[facility] has been notified of room 1238 at [facility] and truck has dispatched for transfer.

## 2014-07-06 NOTE — Progress Notes (Signed)
CRITICAL VALUE ALERT  Critical value received:  Troponin 1.59  Date of notification:  07/12/2014  Time of notification:  2245  Critical value read back:Yes.    Nurse who received alert:  Corrinne Eagle RN  MD notified (1st page):  Georgann Housekeeper ACNP/Dr. Z  Time of first page:  Relayed to ACNP & MD on unit at 2250  MD notified (2nd page):NA  Time of second page:  Responding MD:  Cyd Silence. Z  Time MD responded:  2250

## 2014-07-06 NOTE — Progress Notes (Signed)
80yo male tx'd to [facility] from [facility] for presumed sepsis w/ c/o fever/SOB that started yesterday, found to have leukocytosis, hypotension, and lactic acidosis, to broaden ABX.  Will begin Levaquin 750mg  IV Q48H for CrCl ~40 ml/min and monitor CBC and Cx.  Wynona Neat, PharmD, BCPS 07/17/2014 10:31 PM

## 2014-07-06 NOTE — H&P (Addendum)
PULMONARY / CRITICAL CARE MEDICINE   Name: Justin [name] MRN: [id number] DOB: Jul 02, 1933    ADMISSION DATE:  07/16/2014 CONSULTATION DATE:  07/26/2014  REFERRING MD :  EDP  CHIEF COMPLAINT:  Dyspnea  INITIAL PRESENTATION: 80 yo male with diastolic heart failure currently treated for ITP with systemic steroids  and on IVIG presents to [facility] 8/3 with dyspnea and fever. Hypotensive requiring vasopressors.  STUDIES:  6/24 TTE >>> LVEF 55%, Grade 2 DD 8/3   CT chest >>> bilateral effusions, bilateral GGO, several nodular opacities   SIGNIFICANT EVENTS:  HISTORY OF PRESENT ILLNESS:  80 y.o. male with history of coronary artery disease status post stents, hypertension, dyslipidemia, atrial fibrillation no longer on anticoagulation secondary to recent GI bleed, pacemaker, ITP currently being treated with IVIG by Dr. Marin Olp (most recently 8/3) and prednisone 80mg  daily, who presents to the emergency department with complaints of fever and shortness of breath that started 8/2. In ED found to have leukocytosis, hypotension, and lactic acidosis. Given 1L IVF bolus and started on levophed. Transferred to MICU for suspected sepsis.   PAST MEDICAL HISTORY :  Past Medical History  Diagnosis Date  . CAD (coronary artery disease) prior stenting 1999   . Dyslipidemia   . HTN (hypertension)   . WPW (Wolff-Parkinson-White syndrome) loss of preexcitation   . Atrial fibrillation   . AV block, Mobitz II   . Presyncope   . Myocardial infarction   . Pacemaker 04/07/2013  . Sleep apnea     USES CPAP  . Arthritis   . History of chicken pox   . Hyperlipidemia   . Anemia   . Hypernatremia 06/03/2013  . Thrombocytopenia, unspecified 06/03/2013  . Leukopenia 06/03/2013  . Obstructive sleep apnea 06/03/2013  . Urinary incontinence 06/03/2013  . Atrial fibrillation   . Hyperglycemia 12/21/2013  . Pancytopenia 06/03/2013  . Iron deficiency anemia, unspecified 07/02/2014  . Malabsorption of iron 07/02/2014  .  Hypotestosteronemia 07/02/2014  . ITP (idiopathic thrombocytopenic purpura) 07/02/2014   Past Surgical History  Procedure Laterality Date  . Coronary angioplasty with stent placement    . Vastectomy    . Insert / replace / remove pacemaker  04/07/2013   Prior to Admission medications   Medication Sig Start Date End Date Taking? Authorizing Provider  atorvastatin (LIPITOR) 10 MG tablet Take 1 tablet (10 mg total) by mouth every evening. 03/18/14   Lelon Perla, MD  Azelaic Acid (FINACEA) 15 % cream Apply 1 application topically daily. After skin is thoroughly washed and patted dry, gently but thoroughly massage a thin film of azelaic acid creame    Historical Provider, MD  Cholecalciferol (VITAMIN D-3) 1000 UNITS CAPS Take 1 capsule by mouth daily.    Historical Provider, MD  fluticasone (FLONASE) 50 MCG/ACT nasal spray Place 1 spray into the nose 2 (two) times daily. For seasonal allergies    Historical Provider, MD  furosemide (LASIX) 20 MG tablet Take 3 tablets (60 mg total) by mouth daily. 06/11/14   Lelon Perla, MD  losartan (COZAAR) 50 MG tablet Take 1 tablet (50 mg total) by mouth daily. 06/03/14   Lelon Perla, MD  Multiple Vitamin (MULTIVITAMIN WITH MINERALS) TABS Take 1 tablet by mouth 2 (two) times a week.     Historical Provider, MD  nadolol (CORGARD) 20 MG tablet Take 10 mg by mouth daily. 03/18/14   Lelon Perla, MD  nitroGLYCERIN (NITROSTAT) 0.4 MG SL tablet Place 1 tablet (0.4 mg total) under  the tongue every 5 (five) minutes as needed for chest pain. x3 doses as needed for chest pain 03/18/14   Lelon Perla, MD  omeprazole (PRILOSEC) 20 MG capsule Take 20 mg by mouth daily as needed (for heartburn).  04/28/14   Mosie Lukes, MD  Polyethyl Glycol-Propyl Glycol (SYSTANE ULTRA OP) Apply 1 drop to eye 4 (four) times daily as needed (for dry eyes).    Historical Provider, MD  predniSONE (DELTASONE) 20 MG tablet Take 4 pills a day with food 07/03/14   Volanda Napoleon, MD   Probiotic Product (PROBIOTIC DAILY PO) Take 1 capsule by mouth daily.     Historical Provider, MD  pyridOXINE (VITAMIN B-6) 100 MG tablet Take 100 mg by mouth every evening.    Historical Provider, MD  tamsulosin (FLOMAX) 0.4 MG CAPS capsule Take 0.4 mg by mouth at bedtime.    Historical Provider, MD  vitamin B-12 (CYANOCOBALAMIN) 1000 MCG tablet Take 1,000 mcg by mouth every evening.    Historical Provider, MD   No Known Allergies  FAMILY HISTORY:  Family History  Problem Relation Age of Onset  . Stroke Mother   . Hypertension Mother   . Stroke Sister   . Arthritis Sister     rheumatoid  . Heart attack Sister   . Anemia Sister   . Other Brother     tube put in aorta  . Anemia Brother   . Other Son     cortisone deficiency  . Arthritis Son   . Stroke Brother   . Alcohol abuse Brother   . Barrett's esophagus Son   . Colon cancer Maternal Grandmother   . Colon cancer Maternal Grandfather    SOCIAL HISTORY:  reports that he quit smoking about 22 years ago. His smoking use included Cigarettes. He started smoking about 59 years ago. He has a 36 pack-year smoking history. He has never used smokeless tobacco. He reports that he drinks alcohol. He reports that he does not use illicit drugs.  REVIEW OF SYSTEMS:   Bolds are positive  Constitutional: weight loss, gain, night sweats, Fevers, chills, fatigue .  HEENT: headaches, Sore throat, sneezing, nasal congestion, post nasal drip, Difficulty swallowing, Tooth/dental problems, visual complaints visual changes, ear ache CV:  chest pain, radiates: ,Orthopnea, PND, swelling in lower extremities, dizziness, palpitations, syncope.  GI  heartburn, indigestion, abdominal pain, nausea, vomiting, diarrhea, change in bowel habits, loss of appetite, bloody stools.  Resp: cough, productive: , hemoptysis, dyspnea, chest pain, pleuritic.  Skin: rash or itching or icterus GU: dysuria, change in color of urine, urgency or frequency. flank pain,  hematuria  MS: joint pain or swelling. decreased range of motion  Psych: change in mood or affect. depression or anxiety.  Neuro: difficulty with speech, weakness, numbness, ataxia, confusion  INTERVAL HISTORY:   VITAL SIGNS: Temp:  [96.5 F (35.8 C)-102.6 F (39.2 C)] 98.3 F (36.8 C) (08/03 2000) Pulse Rate:  [42-97] 78 (08/03 2015) Resp:  [18-36] 29 (08/03 2015) BP: (85-142)/(43-59) 93/43 mmHg (08/03 2015) SpO2:  [95 %-100 %] 99 % (08/03 2015) Weight:  [82.101 kg (181 lb)-86.7 kg (191 lb 2.2 oz)] 86.7 kg (191 lb 2.2 oz) (08/03 1958)  HEMODYNAMICS:   VENTILATOR SETTINGS:   INTAKE / OUTPUT: Intake/Output     08/03 0701 - 08/04 0700   I.V. (mL/kg) 1057.7 (12.2)   IV Piggyback 1000   Total Intake(mL/kg) 2057.7 (23.7)   Net +2057.7         PHYSICAL EXAMINATION:  General:  Elderly male of normal body habitus in NAD Neuro:  Alert, oriented, mild confusion HEENT:  [place]/AT, PERRL, No JVD noted Cardiovascular:  RRR, No MRG Lungs:  Clear, resps even unlabored.  Abdomen:  Soft, non-tender, non-distended Musculoskeletal:  No acute deformity or ROM limitation.  Skin:  Trace pitting edema BLE.   LABS:  CBC  Recent Labs Lab 07/03/14 1118 07/10/2014 0833 07/05/2014 1530  WBC 7.4 14.0* 13.4*  HGB 9.4* 9.5* 9.8*  HCT 28.7* 28.8* 30.4*  PLT 14 Platelet count consistent in citrate* 10* 8*   Coag's No results found for this basename: APTT, INR,  in the last 168 hours  BMET  Recent Labs Lab 07/10/2014 1530  NA 136*  K 4.6  CL 99  CO2 24  BUN 45*  CREATININE 1.50*  GLUCOSE 130*   Electrolytes  Recent Labs Lab 07/19/2014 1530  CALCIUM 8.9   Sepsis Markers  Recent Labs Lab 07/15/2014 1537  LATICACIDVEN 2.66*   ABG No results found for this basename: PHART, PCO2ART, PO2ART,  in the last 168 hours  Liver Enzymes  Recent Labs Lab 07/14/2014 1530  AST 77*  ALT 39  ALKPHOS 106  BILITOT 1.7*  ALBUMIN 2.6*   Cardiac Enzymes  Recent Labs Lab 07/04/2014 1530   TROPONINI 0.68*  PROBNP 12303.0*   Glucose  Recent Labs Lab 07/22/2014 1953  GLUCAP 98   IMAGING:  No results found.  ASSESSMENT / PLAN:  PULMONARY A: Possible pneumonia Bilateral effusions No evidence of acute pulmonary edema Minimal oxygen requirements P:   Goal SpO2>92 Supplemental oxygen  CARDIOVASCULAR A:  SIRS / sepsis Hypotension in setting of hypovolemia, infection, suspected adrenal insufficiency Chronic diastolic heart failure H/o PAF, now in SR NSTEMI vs demand ischemia P:  MAP goal > 65 Levophed gtt, titrate to off May need CVL Trend troponin / lactatetatin Hold preadmission Losartan, Nadolol ASA contraindicated - severe thrombocytopenia BB / ACEI contraindicated - hypotension TTE to check for RWMA  RENAL A:   AKI Hypovolemia  P:   Trend BMP NS x 1000 mL, then KVO CVP if CVL is placed  GASTROINTESTINAL A:   GERD Nutrition P:   Protonix NPO for now  HEMATOLOGIC A:   ITP, first IVIG treatment 8/3, on chronic prednisone TTP? ( renal failure, thrombocytopenia, encephalopathy) Anemia VTE prophylaxis P:  Follow CBC Consult hematology (Dr. Marin Olp) in AM SCDs Conitnue Folate, Vit B  INFECTIOUS A:   Possible pneumonia Doubt meningitis P:   Vancomycin 8/3 Zosyn 8/3 Levaquin 8/3  PCT Blood cx Urine cx  Defer LP as very low platelets  ENDOCRINE A:   Suspected adrenal insufficiency ( chronic steroids ) P:   Check cortisol  Stress dose steroids ( Hydrocortisone 50 mg q6h)  NEUROLOGIC A:   Acute encephalopathy Acute delirium Non focal P:   Avoid sedatives / hypnotics If focal findings >>> head CT  Georgann Housekeeper, ACNP [place] [facility]/Critical Care Pager [phone] or [phone]  I have personally obtained history, examined patient, evaluated and interpreted laboratory and imaging results, reviewed medical records, formulated assessment / plan and placed orders.  CRITICAL CARE:  The patient is critically  ill with multiple organ systems failure and requires high complexity decision making for assessment and support, frequent evaluation and titration of therapies, application of advanced monitoring technologies and extensive interpretation of multiple databases. Critical Care Time devoted to patient care services described in this note is 60 minutes.   Doree Fudge, MD Pulmonary and [place]  Pager: [phone]  07/30/2014, 10:13 PM

## 2014-07-06 NOTE — Progress Notes (Signed)
Patient c/o of being cold before the treatment began. After two hours into treatment, pt start to shivered and cold. Infusion was stop and M.D was notified of the situation.  See MAR for orders given.  Pt was taken down for chest x-ray  And to ED per M.d orders.

## 2014-07-06 NOTE — ED Notes (Signed)
Report called to Cindee Salt.  ETA 30 minutes.

## 2014-07-06 NOTE — ED Notes (Signed)
[facility] preparing to transport.

## 2014-07-06 NOTE — Progress Notes (Signed)
Accepted for transfer this 80 year old man with a PMH of progressive thrombocytopenia thought to be secondary to ITP treated with high-dose prednisone with plans and IVIG (given first dose of 2 today), iron deficiency anemia who presented to [facility] with dyspnea and fever after being given a dose of IVIG. He was given Benadryl and Demerol prior to admission. A septic evaluation has been undertaken and broad-spectrum antibiotics initiated. In addition, a CT of the chest is being done to rule out pulmonary embolism. Accepted to SDU bed.  Braidan Ricciardi 08/03/2014 5:30 PM

## 2014-07-06 NOTE — ED Notes (Signed)
Called Pharmacist at [facility] for dose on Zosyn and dose on Vancomycin... Clarene Critchley Pharmacist clarified the dosing of meds.

## 2014-07-06 NOTE — Progress Notes (Addendum)
ANTIBIOTIC CONSULT NOTE - INITIAL  Pharmacy Consult for vancomycin and zosyn Indication: rule out sepsis  No Known Allergies  Patient Measurements:   Body Weight: 82 kg  Vital Signs: Temp: 102.6 F (39.2 C) (08/03 1500) Temp src: Oral (08/03 1500) BP: 142/53 mmHg (08/03 1500) Pulse Rate: 93 (08/03 1500) Intake/Output from previous day:   Intake/Output from this shift:    Labs:  Recent Labs  07/21/2014 0833 07/20/2014 1530  WBC 14.0* 13.4*  HGB 9.5* 9.8*  PLT 10* PENDING   The CrCl is unknown because both a height and weight (above a minimum accepted value) are required for this calculation. No results found for this basename: VANCOTROUGH, VANCOPEAK, VANCORANDOM, GENTTROUGH, GENTPEAK, GENTRANDOM, TOBRATROUGH, TOBRAPEAK, TOBRARND, AMIKACINPEAK, AMIKACINTROU, AMIKACIN,  in the last 72 hours   Microbiology: No results found for this or any previous visit (from the past 720 hour(s)).  Medical History: Past Medical History  Diagnosis Date  . CAD (coronary artery disease) prior stenting 1999   . Dyslipidemia   . HTN (hypertension)   . WPW (Wolff-Parkinson-White syndrome) loss of preexcitation   . Atrial fibrillation   . AV block, Mobitz II   . Presyncope   . Myocardial infarction   . Pacemaker 04/07/2013  . Sleep apnea     USES CPAP  . Arthritis   . History of chicken pox   . Hyperlipidemia   . Anemia   . Hypernatremia 06/03/2013  . Thrombocytopenia, unspecified 06/03/2013  . Leukopenia 06/03/2013  . Obstructive sleep apnea 06/03/2013  . Urinary incontinence 06/03/2013  . Atrial fibrillation   . Hyperglycemia 12/21/2013  . Pancytopenia 06/03/2013  . Iron deficiency anemia, unspecified 07/02/2014  . Malabsorption of iron 07/02/2014  . Hypotestosteronemia 07/02/2014  . ITP (idiopathic thrombocytopenic purpura) 07/02/2014    Medications:  See medication history Assessment: 80 yo man to start vancomycin and zosyn for r/o sepsis.  First doses have been ordered at [facility].  SrCr is  1.5.  Goal of Therapy:  Vancomycin trough level 15-20 mcg/ml  Plan:  Cont vancomycin 750 mg IV q12 hours Cont zosyn 3.375 gm IV q8 hours F/u renal function, cultures and clinical course  Thanks for allowing pharmacy to be a part of this patient's care.  Excell Seltzer, PharmD Clinical Pharmacist, [phone] 07/16/2014,4:18 PM

## 2014-07-06 NOTE — ED Notes (Signed)
IV Levophen initiated at 27mcg/min per Dr. Leonides Schanz verbal order.  [facility] initiating gtt.

## 2014-07-06 NOTE — ED Notes (Signed)
MD at bedside speaking with family 

## 2014-07-06 NOTE — ED Notes (Signed)
Family assisted pt to [facility].  Tachypneic.  Assisted back to bed.

## 2014-07-06 NOTE — ED Notes (Signed)
SHOB that started today.  Was in Dr. Ulis Rias office PTA and developed severe [facility] after initiation of Iron infusion. No sick contacts

## 2014-07-06 NOTE — ED Notes (Signed)
MD at bedside. 

## 2014-07-06 NOTE — ED Notes (Signed)
Patient transported to CT 

## 2014-07-06 NOTE — ED Notes (Signed)
[facility] at bedside 

## 2014-07-06 NOTE — ED Provider Notes (Addendum)
TIME SEEN: 3:20 PM  CHIEF COMPLAINT: Fever, shortness of breath  HPI: Patient is a 80 y.o. M with history of coronary artery disease status post stents, hypertension, dyslipidemia, atrial fibrillation no longer on anticoagulation secondary to recent GI bleed, pacemaker, ITP currently being treated with IVIG by Dr. Marin Olp who presents to the emergency department with complaints of fever and shortness of breath that started yesterday. He denies any chest pain or chest discomfort. No bloody stool or melena. No vomiting or diarrhea. No headache, neck pain or neck tightness. No tick bite. No rash. No cough. No sick contacts or recent travel. He does not wear oxygen at home.  ROS: See HPI Constitutional: no fever  Eyes: no drainage  ENT: no runny nose   Cardiovascular:  no chest pain  Resp:  SOB  GI: no vomiting GU: no dysuria Integumentary: no rash  Allergy: no hives  Musculoskeletal: no leg swelling  Neurological: no slurred speech ROS otherwise negative  PAST MEDICAL HISTORY/PAST SURGICAL HISTORY:  Past Medical History  Diagnosis Date  . CAD (coronary artery disease) prior stenting 1999   . Dyslipidemia   . HTN (hypertension)   . WPW (Wolff-Parkinson-White syndrome) loss of preexcitation   . Atrial fibrillation   . AV block, Mobitz II   . Presyncope   . Myocardial infarction   . Pacemaker 04/07/2013  . Sleep apnea     USES CPAP  . Arthritis   . History of chicken pox   . Hyperlipidemia   . Anemia   . Hypernatremia 06/03/2013  . Thrombocytopenia, unspecified 06/03/2013  . Leukopenia 06/03/2013  . Obstructive sleep apnea 06/03/2013  . Urinary incontinence 06/03/2013  . Atrial fibrillation   . Hyperglycemia 12/21/2013  . Pancytopenia 06/03/2013  . Iron deficiency anemia, unspecified 07/02/2014  . Malabsorption of iron 07/02/2014  . Hypotestosteronemia 07/02/2014  . ITP (idiopathic thrombocytopenic purpura) 07/02/2014    MEDICATIONS:  Prior to Admission medications   Medication Sig  Start Date End Date Taking? Authorizing Provider  atorvastatin (LIPITOR) 10 MG tablet Take 1 tablet (10 mg total) by mouth every evening. 03/18/14   Lelon Perla, MD  Azelaic Acid (FINACEA) 15 % cream Apply 1 application topically daily. After skin is thoroughly washed and patted dry, gently but thoroughly massage a thin film of azelaic acid creame    Historical Provider, MD  Cholecalciferol (VITAMIN D-3) 1000 UNITS CAPS Take 1 capsule by mouth daily.    Historical Provider, MD  fluticasone (FLONASE) 50 MCG/ACT nasal spray Place 1 spray into the nose 2 (two) times daily. For seasonal allergies    Historical Provider, MD  furosemide (LASIX) 20 MG tablet Take 3 tablets (60 mg total) by mouth daily. 06/11/14   Lelon Perla, MD  losartan (COZAAR) 50 MG tablet Take 1 tablet (50 mg total) by mouth daily. 06/03/14   Lelon Perla, MD  Multiple Vitamin (MULTIVITAMIN WITH MINERALS) TABS Take 1 tablet by mouth 2 (two) times a week.     Historical Provider, MD  nadolol (CORGARD) 20 MG tablet Take 10 mg by mouth daily. 03/18/14   Lelon Perla, MD  nitroGLYCERIN (NITROSTAT) 0.4 MG SL tablet Place 1 tablet (0.4 mg total) under the tongue every 5 (five) minutes as needed for chest pain. x3 doses as needed for chest pain 03/18/14   Lelon Perla, MD  omeprazole (PRILOSEC) 20 MG capsule Take 20 mg by mouth daily as needed (for heartburn).  04/28/14   Mosie Lukes, MD  Polyethyl  Glycol-Propyl Glycol (SYSTANE ULTRA OP) Apply 1 drop to eye 4 (four) times daily as needed (for dry eyes).    Historical Provider, MD  predniSONE (DELTASONE) 20 MG tablet Take 4 pills a day with food 07/03/14   Volanda Napoleon, MD  Probiotic Product (PROBIOTIC DAILY PO) Take 1 capsule by mouth daily.     Historical Provider, MD  pyridOXINE (VITAMIN B-6) 100 MG tablet Take 100 mg by mouth every evening.    Historical Provider, MD  tamsulosin (FLOMAX) 0.4 MG CAPS capsule Take 0.4 mg by mouth at bedtime.    Historical Provider, MD   vitamin B-12 (CYANOCOBALAMIN) 1000 MCG tablet Take 1,000 mcg by mouth every evening.    Historical Provider, MD    ALLERGIES:  No Known Allergies  SOCIAL HISTORY:  History  Substance Use Topics  . Smoking status: Former Smoker -- 1.00 packs/day for 36 years    Types: Cigarettes    Start date: 01/25/1955    Quit date: 12/05/1991  . Smokeless tobacco: Never Used     Comment: quit smoking 22 yeras ago  . Alcohol Use: Yes     Comment: Occasional    FAMILY HISTORY: Family History  Problem Relation Age of Onset  . Stroke Mother   . Hypertension Mother   . Stroke Sister   . Arthritis Sister     rheumatoid  . Heart attack Sister   . Anemia Sister   . Other Brother     tube put in aorta  . Anemia Brother   . Other Son     cortisone deficiency  . Arthritis Son   . Stroke Brother   . Alcohol abuse Brother   . Barrett's esophagus Son   . Colon cancer Maternal Grandmother   . Colon cancer Maternal Grandfather     EXAM: BP 142/53  Pulse 93  Temp(Src) 102.6 F (39.2 C) (Oral)  Resp 36  SpO2 95% CONSTITUTIONAL: Alert and oriented and responds appropriately to questions. Well-appearing; well-nourished HEAD: Normocephalic EYES: Conjunctivae clear, PERRL ENT: normal nose; no rhinorrhea; moist mucous membranes; pharynx without lesions noted NECK: Supple, no meningismus, no LAD  CARD: RRR; S1 and S2 appreciated; no murmurs, no clicks, no rubs, no gallops RESP: Normal chest excursion without splinting, patient is tachypneic, mild expiratory wheezing heard diffusely, no rhonchi or rales, good aeration, speaking full sentences ABD/GI: Normal bowel sounds; non-distended; soft, non-tender, no rebound, no guarding BACK:  The back appears normal and is non-tender to palpation, there is no CVA tenderness EXT: Normal ROM in all joints; non-tender to palpation; no edema; normal capillary refill; no cyanosis    SKIN: Normal color for age and race; warm NEURO: Moves all extremities  equally PSYCH: The patient's mood and manner are appropriate. Grooming and personal hygiene are appropriate.  MEDICAL DECISION MAKING: Patient here with fever, tachypnea. Concern for possible pneumonia. Differential also includes CHF exacerbation, CAD, PE. We'll obtain labs, cultures, urine, chest x-ray. We'll need admission.  ED PROGRESS: Patient is a leukocytosis of 13.4 with left shift. He meets SIRS criteria. We'll give broad-spectrum antibiotics.   5:30 PM  Patient's troponin is positive at 0.68. I think this is likely secondary to strain due to his sepsis. Will hold giving aspirin at time given he has a platelet level of 8000. He is not actively bleeding. He will likely need transfusion but we do not have platelets available at [facility] [place]. Urine shows leukocytes, hemoglobin and bacteria. Culture pending. Chest x-ray done earlier today  shows bronchitic changes. CT of his chest is still pending. Discussed with Dr. Rockne Menghini with hospitalist service for admission to step down at [facility] long. Family updated with plan.   6:15 PM  Patient CT shows no pulmonary embolus. He has bilateral effusions with extensive groundglass opacities that may be ovulatory edema versus atypical infection. He does have an elevated BNP but does not appear volume overloaded. He is mildly hypotensive. We'll continue to hydrate at this time. He will be going to step down bed.   6:45 PM  Pt's pressure continues to drop despite IV fluids. He is now received a half liters of IV fluids and his maps were in the 40s to 50s. We'll start Levophed. Will consult intensivist at [facility]. CT scan shows no pulmonary embolus. He does have alveolar edema versus atypical infection.   7:05 PM  MAP has from 47 to 58 with levophed. Discussed with Dr. Elsworth Soho with intensivist service for admission. Family updated. Critical care bedside. Patient reassessed. His tachypnea has resolved. No hypoxia. He is mentating normally. No current  complaints.   Date: 07/31/2014 15:09  Rate: 93  Rhythm: normal sinus rhythm  QRS Axis: normal  Intervals: normal  ST/T Wave abnormalities: normal  Conduction Disutrbances: none  Narrative Interpretation: Atrial sensed ventricular paced rhythm    CRITICAL CARE Performed by: Nyra Jabs   Total critical care time: 40 minutes  Critical care time was exclusive of separately billable procedures and treating other patients.  Critical care was necessary to treat or prevent imminent or life-threatening deterioration.  Critical care was time spent personally by me on the following activities: development of treatment plan with patient and/or surrogate as well as nursing, discussions with consultants, evaluation of patient's response to treatment, examination of patient, obtaining history from patient or surrogate, ordering and performing treatments and interventions, ordering and review of laboratory studies, ordering and review of radiographic studies, pulse oximetry and re-evaluation of patient's condition.       [place], DO 07/10/2014 [place], DO 07/29/2014 1909

## 2014-07-06 NOTE — ED Notes (Signed)
Pt sent to ED for further evaluation of SHOB and fever that developed after initiation of Immunoglobulin G IV from oncologist.  He was given Benadryl and Demerol 25mg  IV total PTA.   S/P bone marrow aspiration 3 days ago.  Wife reports fever of 101 yesterday and chills.

## 2014-07-07 ENCOUNTER — Encounter (HOSPITAL_COMMUNITY): Payer: Self-pay

## 2014-07-07 ENCOUNTER — Encounter: Payer: Medicare Other | Admitting: Hematology & Oncology

## 2014-07-07 ENCOUNTER — Inpatient Hospital Stay (HOSPITAL_COMMUNITY): Payer: Medicare Other

## 2014-07-07 ENCOUNTER — Other Ambulatory Visit: Payer: Medicare Other | Admitting: Lab

## 2014-07-07 ENCOUNTER — Ambulatory Visit: Payer: Medicare Other

## 2014-07-07 DIAGNOSIS — R0682 Tachypnea, not elsewhere classified: Secondary | ICD-10-CM

## 2014-07-07 DIAGNOSIS — I059 Rheumatic mitral valve disease, unspecified: Secondary | ICD-10-CM

## 2014-07-07 DIAGNOSIS — R0602 Shortness of breath: Secondary | ICD-10-CM

## 2014-07-07 LAB — BASIC METABOLIC PANEL
ANION GAP: 14 (ref 5–15)
BUN: 48 mg/dL — ABNORMAL HIGH (ref 6–23)
CO2: 18 mEq/L — ABNORMAL LOW (ref 19–32)
CREATININE: 1.57 mg/dL — AB (ref 0.50–1.35)
Calcium: 7.4 mg/dL — ABNORMAL LOW (ref 8.4–10.5)
Chloride: 101 mEq/L (ref 96–112)
GFR, EST AFRICAN AMERICAN: 47 mL/min — AB (ref >90)
GFR, EST NON AFRICAN AMERICAN: 40 mL/min — AB (ref >90)
Glucose, Bld: 147 mg/dL — ABNORMAL HIGH (ref 70–99)
Potassium: 4.4 mEq/L (ref 3.7–5.3)
Sodium: 133 mEq/L — ABNORMAL LOW (ref 137–147)

## 2014-07-07 LAB — CORTISOL: Cortisol, Plasma: 36.9 ug/dL

## 2014-07-07 LAB — CBC
HCT: 26.1 % — ABNORMAL LOW (ref 39.0–52.0)
Hemoglobin: 8.6 g/dL — ABNORMAL LOW (ref 13.0–17.0)
MCH: 31.3 pg (ref 26.0–34.0)
MCHC: 33 g/dL (ref 30.0–36.0)
MCV: 94.9 fL (ref 78.0–100.0)
PLATELETS: 11 10*3/uL — AB (ref 150–400)
RBC: 2.75 MIL/uL — ABNORMAL LOW (ref 4.22–5.81)
RDW: 16.2 % — AB (ref 11.5–15.5)
WBC: 24.8 10*3/uL — AB (ref 4.0–10.5)

## 2014-07-07 LAB — GLUCOSE, CAPILLARY
GLUCOSE-CAPILLARY: 151 mg/dL — AB (ref 70–99)
GLUCOSE-CAPILLARY: 144 mg/dL — AB (ref 70–99)
Glucose-Capillary: 131 mg/dL — ABNORMAL HIGH (ref 70–99)

## 2014-07-07 LAB — ABO/RH: ABO/RH(D): A NEG

## 2014-07-07 LAB — PROCALCITONIN: PROCALCITONIN: 90.83 ng/mL

## 2014-07-07 LAB — PREPARE RBC (CROSSMATCH)

## 2014-07-07 LAB — LACTIC ACID, PLASMA: Lactic Acid, Venous: 2.6 mmol/L — ABNORMAL HIGH (ref 0.5–2.2)

## 2014-07-07 MED ORDER — ROMIPLOSTIM 250 MCG ~~LOC~~ SOLR
2.0000 ug/kg | Freq: Once | SUBCUTANEOUS | Status: AC
  Administered 2014-07-07: 175 ug via SUBCUTANEOUS
  Filled 2014-07-07: qty 0.35

## 2014-07-07 MED ORDER — INSULIN ASPART 100 UNIT/ML ~~LOC~~ SOLN
1.0000 [IU] | Freq: Three times a day (TID) | SUBCUTANEOUS | Status: DC
  Administered 2014-07-07 (×2): 1 [IU] via SUBCUTANEOUS
  Administered 2014-07-08: 3 [IU] via SUBCUTANEOUS
  Administered 2014-07-08 (×2): 2 [IU] via SUBCUTANEOUS
  Administered 2014-07-08 – 2014-07-09 (×3): 1 [IU] via SUBCUTANEOUS
  Administered 2014-07-09 – 2014-07-10 (×2): 2 [IU] via SUBCUTANEOUS
  Administered 2014-07-10: 1 [IU] via SUBCUTANEOUS

## 2014-07-07 MED ORDER — SODIUM CHLORIDE 0.9 % IV SOLN
INTRAVENOUS | Status: DC
  Administered 2014-07-07 – 2014-07-08 (×2): via INTRAVENOUS

## 2014-07-07 MED ORDER — SODIUM CHLORIDE 0.9 % IV SOLN: Freq: Once | INTRAVENOUS | Status: DC

## 2014-07-07 MED ORDER — INSULIN ASPART 100 UNIT/ML ~~LOC~~ SOLN
1.0000 [IU] | SUBCUTANEOUS | Status: DC
  Administered 2014-07-07: 2 [IU] via SUBCUTANEOUS

## 2014-07-07 NOTE — Consult Note (Signed)
Justin [name], Justin [name] NO.:  [id number]  MEDICAL RECORD NO.:  [id number]  LOCATION:  2M05C                        FACILITY:  [facility]  PHYSICIAN:  Volanda Napoleon, M.D.  DATE OF BIRTH:  1933-11-07  DATE OF CONSULTATION:  07/07/2014 DATE OF DISCHARGE:                                CONSULTATION   REFERRING PHYSICIAN:  Leanna Sato. Elsworth Soho, MD  REASON FOR CONSULTATION: 1. Thrombocytopenia-likely immune based. 2. Sepsis.  HISTORY OF PRESENT ILLNESS:  Justin [name] is a very nice 80 year old white gentleman.  I saw him initially about 2 weeks ago.  He had progressive thrombocytopenia.  We went ahead and did a bone marrow biopsy on him.  This was done 1 week ago.  The bone marrow was essentially slightly hypercellular with no dyspoiesis.  He had adequate megakaryocytes.  His iron studies were low. His testosterone level was low.  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  I went ahead and start him on prednisone 80 mg a day on July 03, 2014.  I was to give him IVIG on July 06, 2014.  He got a dose of IV iron on July 03, 2014.  On July 05, 2014, he had a fever of 101.6.  He had shaking chills. This resolved on its own.  In the office yesterday, he had chills.  He was afebrile.  We did a chest x-ray, which is normal.  His blood work shows a platelet count down at 10,000.  He continued to decline in the office.  He required some more oxygen. His blood pressure dropped.  He subsequently was admitted to the ICU.  He had a CT angiogram done.  This was negative for pulmonary embolism. There is no obvious infiltrate.  He was started on pressors for hypotension.  He was given stress dose steroids.  We are seeing him for the thrombocytopenia management.  He has had no bleeding.  There may be some bruising.  His son was in the room this morning.  I was able to talk to him.  I gave him an update.  [name] looks a little better.  His blood pressure on pressors  is 104/70.  He has had no cough.  On his admission labs, his urine did look somewhat cloudy and infected. Cultures are still pending.  He currently is on Zosyn and vancomycin.  PAST MEDICAL HISTORY:  Remarkable for coronary artery disease.  He had stenting.  He has a pacemaker in place.  He has history of atrial fibrillation.  He was on Eliquis but this has been stopped.  He has sleep apnea.  ALLERGIES:  None.  ADMISSION MEDICATIONS: 1. Lipitor 10 mg p.o. daily. 2. Finacea cream 15% apply daily. 3. Vitamin D3 1000 units daily. 4. Lasix 60 mg p.o. daily. 5. Cozaar 50 mg p.o. daily. 6. Nadolol 10 mg p.o. daily. 7. Prilosec 20 mg p.o. daily. 8. Prednisone 80 mg p.o. daily. 9. Vitamin B6 100 mg p.o. each bedtime. 10.Flomax 0.4 mg p.o. each bedtime. 11.Vitamin B12 1000 mcg p.o. each bedtime.  SOCIAL HISTORY:  Negative for current tobacco use.  He smoked which he stopped 22 years ago.  He has about  35 pack year history of tobacco use. He has rare alcohol use.  PHYSICAL EXAMINATION:  GENERAL:  He is an elderly white gentleman in no obvious distress.  He is alert and oriented x3. VITAL SIGNS:  Temperature of 98.2.  Pulse 68, respiratory rate 30, blood pressure 109/42.  Oxygen saturation is 98%. HEAD AND NECK:  No ocular or oral lesions.  He has no adenopathy in the neck.  He has no petechiae in the oral cavity. LUNGS:  Clear.  There may be some slight wheezes in the expiratory phase. CARDIAC:  Regular rate and rhythm with no murmurs, rubs, or bruits. ABDOMEN:  Soft.  He has good bowel sounds.  There is no fluid wave. There is no palpable liver or spleen tip. BACK:  No erythema or swelling in the buttock area.  He has no tenderness over the spine. EXTREMITIES:  Compression boots on.  He has no obvious edema. SKIN:  Scattered ecchymoses.  LABORATORY STUDIES:  White cell count 24.8, hemoglobin 8.6, platelet count 11,000.  MCV 95.  His sodium 133, potassium 4.4, BUN  48, creatinine 1.57.  Calcium 7.4.  His procalcitonin is 90.83.  Lactic acid 2.6.  Chest x-ray today is pending.  IMPRESSION:  Justin [name] is a 80 year old gentleman with immune thrombocytopenia.  This is the working diagnosis from the hematologic standpoint.  He had a bone marrow test done.  Bone marrow testing is negative for amyloid.  This is still a concern from the cardiologist given his diastolic dysfunction.  We are sending off monoclonal studies. I would like to get a 24-hour urine on him but this might be difficult right now given his tenuous status.  As far as his thrombocytopenia is concerned, I will give him a dose of Nplate.  I think this is worthwhile.  This should be able to work quickly.  I would not transfuse unless he has obvious bleeding.  His hemoglobin is 8.6.  I think 1 unit of blood will help.  His erythropoietin level is on the low side, so Aranesp also would be helpful.  Blood, however, I think would be more short-term benefit given his hypotension.  I spoke with his son.  I gave him an update.  He will be interesting to see what his cultures show.  We will follow along very closely.  We will provide any help that we can for his blood.  I very, very, very much appreciate the outstanding care that he is getting in the ICU.  I am very grateful for the compassion that the staff have for him and the other patients.     Volanda Napoleon, M.D.     PRE/MEDQ  D:  07/07/2014  T:  07/07/2014  Job:  [id number]

## 2014-07-07 NOTE — Progress Notes (Signed)
PULMONARY / CRITICAL CARE MEDICINE   Name: Justin [name] MRN: [id number] DOB: 03/17/1933    ADMISSION DATE:  07/19/2014 CONSULTATION DATE:  07/15/2014  REFERRING MD :  EDP  CHIEF COMPLAINT:  Dyspnea  INITIAL PRESENTATION: 80 yo male with diastolic heart failure currently treated for ITP with systemic steroids  and on IVIG presents to [facility] 8/3 with dyspnea and fever. Hypotensive requiring vasopressors.  STUDIES:  6/24 TTE >>> LVEF 55%, Grade 2 DD 8/3   CT chest >>> bilateral effusions, bilateral GGO, several nodular opacities   SIGNIFICANT EVENTS:  No acute overnight events, states that he just has generalized weakness, no fever overnight.  Dr. Marin Olp saw him this this morning  KEY EVENTS: 8/3 - admitted with SOB, Fever\Chills, fatigue after 1st IVIG infusion for suspected ITP  HISTORY OF PRESENT ILLNESS:  80 y.o. male with history of coronary artery disease status post stents, hypertension, dyslipidemia, atrial fibrillation no longer on anticoagulation secondary to recent GI bleed, pacemaker, ITP currently being treated with IVIG by Dr. Marin Olp (most recently 8/3) and prednisone 80mg  daily, who presents to the emergency department with complaints of fever and shortness of breath that started 8/2. In ED found to have leukocytosis, hypotension, and lactic acidosis. Given 1L IVF bolus and started on levophed. Transferred to MICU for suspected sepsis.   PAST MEDICAL HISTORY :  Past Medical History  Diagnosis Date  . CAD (coronary artery disease) prior stenting 1999   . Dyslipidemia   . HTN (hypertension)   . WPW (Wolff-Parkinson-White syndrome) loss of preexcitation   . Atrial fibrillation   . AV block, Mobitz II   . Presyncope   . Myocardial infarction   . Pacemaker 04/07/2013  . Sleep apnea     USES CPAP  . Arthritis   . History of chicken pox   . Hyperlipidemia   . Anemia   . Hypernatremia 06/03/2013  . Thrombocytopenia, unspecified 06/03/2013  . Leukopenia 06/03/2013  .  Obstructive sleep apnea 06/03/2013  . Urinary incontinence 06/03/2013  . Atrial fibrillation   . Hyperglycemia 12/21/2013  . Pancytopenia 06/03/2013  . Iron deficiency anemia, unspecified 07/02/2014  . Malabsorption of iron 07/02/2014  . Hypotestosteronemia 07/02/2014  . ITP (idiopathic thrombocytopenic purpura) 07/02/2014   Past Surgical History  Procedure Laterality Date  . Coronary angioplasty with stent placement    . Vastectomy    . Insert / replace / remove pacemaker  04/07/2013   Prior to Admission medications   Medication Sig Start Date End Date Taking? Authorizing Provider  atorvastatin (LIPITOR) 10 MG tablet Take 1 tablet (10 mg total) by mouth every evening. 03/18/14   Lelon Perla, MD  Azelaic Acid (FINACEA) 15 % cream Apply 1 application topically daily. After skin is thoroughly washed and patted dry, gently but thoroughly massage a thin film of azelaic acid creame    Historical Provider, MD  Cholecalciferol (VITAMIN D-3) 1000 UNITS CAPS Take 1 capsule by mouth daily.    Historical Provider, MD  fluticasone (FLONASE) 50 MCG/ACT nasal spray Place 1 spray into the nose 2 (two) times daily. For seasonal allergies    Historical Provider, MD  furosemide (LASIX) 20 MG tablet Take 3 tablets (60 mg total) by mouth daily. 06/11/14   Lelon Perla, MD  losartan (COZAAR) 50 MG tablet Take 1 tablet (50 mg total) by mouth daily. 06/03/14   Lelon Perla, MD  Multiple Vitamin (MULTIVITAMIN WITH MINERALS) TABS Take 1 tablet by mouth 2 (two) times a week.  Historical Provider, MD  nadolol (CORGARD) 20 MG tablet Take 10 mg by mouth daily. 03/18/14   Lelon Perla, MD  nitroGLYCERIN (NITROSTAT) 0.4 MG SL tablet Place 1 tablet (0.4 mg total) under the tongue every 5 (five) minutes as needed for chest pain. x3 doses as needed for chest pain 03/18/14   Lelon Perla, MD  omeprazole (PRILOSEC) 20 MG capsule Take 20 mg by mouth daily as needed (for heartburn).  04/28/14   Mosie Lukes, MD   Polyethyl Glycol-Propyl Glycol (SYSTANE ULTRA OP) Apply 1 drop to eye 4 (four) times daily as needed (for dry eyes).    Historical Provider, MD  predniSONE (DELTASONE) 20 MG tablet Take 4 pills a day with food 07/03/14   Volanda Napoleon, MD  Probiotic Product (PROBIOTIC DAILY PO) Take 1 capsule by mouth daily.     Historical Provider, MD  pyridOXINE (VITAMIN B-6) 100 MG tablet Take 100 mg by mouth every evening.    Historical Provider, MD  tamsulosin (FLOMAX) 0.4 MG CAPS capsule Take 0.4 mg by mouth at bedtime.    Historical Provider, MD  vitamin B-12 (CYANOCOBALAMIN) 1000 MCG tablet Take 1,000 mcg by mouth every evening.    Historical Provider, MD   No Known Allergies  FAMILY HISTORY:  Family History  Problem Relation Age of Onset  . Stroke Mother   . Hypertension Mother   . Stroke Sister   . Arthritis Sister     rheumatoid  . Heart attack Sister   . Anemia Sister   . Other Brother     tube put in aorta  . Anemia Brother   . Other Son     cortisone deficiency  . Arthritis Son   . Stroke Brother   . Alcohol abuse Brother   . Barrett's esophagus Son   . Colon cancer Maternal Grandmother   . Colon cancer Maternal Grandfather    SOCIAL HISTORY:  reports that he quit smoking about 22 years ago. His smoking use included Cigarettes. He started smoking about 59 years ago. He has a 36 pack-year smoking history. He has never used smokeless tobacco. He reports that he drinks alcohol. He reports that he does not use illicit drugs.  REVIEW OF SYSTEMS:   Bolds are positive  Constitutional: weight loss, gain, night sweats, Fevers, chills, fatigue .  HEENT: headaches, Sore throat, sneezing, nasal congestion, post nasal drip, Difficulty swallowing, Tooth/dental problems, visual complaints visual changes, ear ache CV:  chest pain, radiates: ,Orthopnea, PND, swelling in lower extremities, dizziness, palpitations, syncope.  GI  heartburn, indigestion, abdominal pain, nausea, vomiting, diarrhea,  change in bowel habits, loss of appetite, bloody stools.  Resp: cough, productive: , hemoptysis, dyspnea, chest pain, pleuritic.  Skin: rash or itching or icterus GU: dysuria, change in color of urine, urgency or frequency. flank pain, hematuria  MS: joint pain or swelling. decreased range of motion  Psych: change in mood or affect. depression or anxiety.  Neuro: difficulty with speech, weakness, numbness, ataxia, confusion  INTERVAL HISTORY:   VITAL SIGNS: Temp:  [96.5 F (35.8 C)-102.6 F (39.2 C)] 98 F (36.7 C) (08/04 0735) Pulse Rate:  [42-97] 66 (08/04 0700) Resp:  [9-36] 9 (08/04 0700) BP: (79-142)/(41-59) 115/48 mmHg (08/04 0700) SpO2:  [95 %-100 %] 100 % (08/04 0700) Weight:  [181 lb (82.101 kg)-191 lb 2.2 oz (86.7 kg)] 191 lb 2.2 oz (86.7 kg) (08/03 1958)     INTAKE / OUTPUT: Intake/Output     08/03 0701 - 08/04  0700 08/04 0701 - 08/05 0700   I.V. (mL/kg) 1143.5 (13.2)    IV Piggyback 1350    Total Intake(mL/kg) 2493.5 (28.8)    Urine (mL/kg/hr) 275 50 (0.8)   Total Output 275 50   Net +2218.5 -50          PHYSICAL EXAMINATION: General:  Elderly male of normal body habitus in NAD Neuro:  Alert, oriented, mild confusion HEENT:  [place]/AT, PERRL, No JVD noted Cardiovascular:  RRR, No MRG Lungs:  Mild coarse upper airway sounds, mild dec b\l breath sounds, good effort, good air entry Abdomen:  Soft, non-tender, non-distended Musculoskeletal:  No acute deformity or ROM limitation.  Skin:  Trace pitting edema BLE.   LABS:  CBC  Recent Labs Lab 07/10/2014 0833 07/05/2014 1530 07/07/14 0235  WBC 14.0* 13.4* 24.8*  HGB 9.5* 9.8* 8.6*  HCT 28.8* 30.4* 26.1*  PLT 10* 8* 11*   Coag's  Recent Labs Lab 08/02/2014 2140  INR 1.48    BMET  Recent Labs Lab 08/02/2014 1530 07/07/14 0235  NA 136* 133*  K 4.6 4.4  CL 99 101  CO2 24 18*  BUN 45* 48*  CREATININE 1.50* 1.57*  GLUCOSE 130* 147*   Electrolytes  Recent Labs Lab 07/30/2014 1530 08/01/2014 2315  07/07/14 0235  CALCIUM 8.9  --  7.4*  MG  --  2.0  --    Sepsis Markers  Recent Labs Lab 07/31/2014 1537 07/26/2014 2140 07/07/14 0235  LATICACIDVEN 2.66*  --  2.6*  PROCALCITON  --  59.31 90.83   ABG No results found for this basename: PHART, PCO2ART, PO2ART,  in the last 168 hours  Liver Enzymes  Recent Labs Lab 07/23/2014 1530  AST 77*  ALT 39  ALKPHOS 106  BILITOT 1.7*  ALBUMIN 2.6*   Cardiac Enzymes  Recent Labs Lab 07/07/2014 1530 07/29/2014 2140  TROPONINI 0.68* 1.59*  PROBNP 12303.0*  --    Glucose  Recent Labs Lab 07/14/2014 1953  GLUCAP 98   IMAGING:  Dg Chest 2 View  07/24/2014   CLINICAL DATA:  Fever, chills, shortness of breath.  EXAM: CHEST  2 VIEW  COMPARISON:  06/10/2014  FINDINGS: Left pacer in place with leads in the right atrium and right ventricle. Heart is normal size. Mild peribronchial thickening. No confluent airspace opacities or effusions. No acute bony abnormality.  IMPRESSION: Bronchitic changes.   Electronically Signed   By: Rolm Baptise M.D.   On: 07/23/2014 15:07   Ct Angio Chest W/cm &/or Wo Cm  07/29/2014   CLINICAL DATA:  Shortness of breath and fever  EXAM: CT ANGIOGRAPHY CHEST WITH CONTRAST  TECHNIQUE: Multidetector CT imaging of the chest was performed using the standard protocol during bolus administration of intravenous contrast. Multiplanar CT image reconstructions and MIPs were obtained to evaluate the vascular anatomy.  CONTRAST:  10mL OMNIPAQUE IOHEXOL 350 MG/ML SOLN  COMPARISON:  Chest radiograph July 06, 2014  FINDINGS: There is no demonstrable pulmonary embolus. There is no thoracic aortic aneurysm or dissection.  There are free-flowing pleural effusions bilaterally. There is hazy ground-glass opacity diffusely.  On axial CT slice 55 series 6, there is a 6 x 4 mm nodular opacity in the medial segment of the right middle lobe. On axial slice 64 series 6, there is a 6 x 4 mm nodular opacity in the lateral segment of the left lower  lobe. On axial slice 62 series 6, there is a 7 x 5 mm nodular opacity in the lateral segment  of the right middle lobe. On axial slice 69 series 6, there is a 5 x 4 mm nodular opacity in the superior segment of the right lower lobe.  There is no appreciable thoracic adenopathy. Pericardium is not thickened. Pacemaker leads are attached to the right atrium and right ventricle. There is a focal hiatal hernia.  Visualized upper abdominal structures appear unremarkable. There are no blastic or lytic bone lesions. There is degenerative change in the thoracic spine. Thyroid appears unremarkable.  Review of the MIP images confirms the above findings.  IMPRESSION: No demonstrable pulmonary embolus.  Bilateral effusions with extensive ground-glass opacity bilaterally. Suspect alveolar edema with findings consistent with congestive heart failure. Atypical infection or allergic type phenomenon are differential considerations, however.  Several nodular opacities, largest measuring 7 mm. Followup of these nodular opacity should be based on Fleischner Society guidelines. If the patient is at high risk for bronchogenic carcinoma, follow-up chest CT at 3-48months is recommended. If the patient is at low risk for bronchogenic carcinoma, follow-up chest CT at 6-12 months is recommended. This recommendation follows the consensus statement: Guidelines for Management of Small Pulmonary Nodules Detected on CT Scans: A Statement from the [place] as published in Radiology 2005; 237:395-400.  Focal hiatal hernia.  No adenopathy.   Electronically Signed   By: Lowella Grip M.D.   On: 07/15/2014 18:09    LINES / TUBES:   CULTURES:  8/3 BCx2 >> GPC in chains  8/3 urine>>GPC in chains   ANTIBIOTICS:  Zosyn 8/3  Vanc 8/3  Levaquin 8/3    ASSESSMENT / PLAN:  PULMONARY A: Possible pneumonia vs TRALI from IVIG Bilateral effusions - stable No evidence of acute pulmonary edema Minimal oxygen requirements P:    Goal SpO2>92 Cont with Zosyn, Vanc, Levaquin (immunosuppressed with GPC in blood and urine) Supplemental oxygen Hold IVIG, start on GM-CSF by Heme\Onc (Dr. Marin Olp)  CARDIOVASCULAR A:  SIRS / sepsis Hypotension in setting of hypovolemia, infection, suspected adrenal insufficiency Chronic diastolic heart failure H/o PAF, now in SR demand ischemia P:  MAP goal > 65 Levophed gtt as need, currently titrated off overnight Trend troponin / lactate - Elevated lactate possibly due to both IVIG and Sepsis Hold outpatient Losartan, Nadolol ASA contraindicated - severe thrombocytopenia BB / ACEI contraindicated - hypotension TTE to check for RWMA  RENAL A:   AKI Hypovolemia  P:   Trend BMP Got 2.5L total bolus yesterday, off pressors, still a little dry, with AKI, will cont with NS@100cc \hr for today (Monitor resp status)  Place CVL is need (MAP <60, continued pressor requirement)  GASTROINTESTINAL A:   GERD Nutrition  P:   Protonix Start heart healthy diet today  HEMATOLOGIC A:   ITP, first IVIG treatment 8/3, on chronic prednisone TTP? ( renal failure, thrombocytopenia, encephalopathy) Anemia VTE prophylaxis P:  Follow CBC Dr. Marin Olp  SCDs Conitnue Folate, Vit B  INFECTIOUS A:   Possible pneumonia Doubt meningitis - more alert and awake today P:   Vancomycin 8/3 Zosyn 8/3 Levaquin 8/3  PCT Defer LP as very low platelets  ENDOCRINE A:   Suspected adrenal insufficiency ( chronic steroids ) P:   Cont with Stress dose steroids ( Hydrocortisone 50 mg q6h)  NEUROLOGIC A:   Acute encephalopathy - resolving Acute delirium - resolving Non focal P:   Avoid sedatives / hypnotics If focal findings >>> head CT Most likely related to IVIG   TODAY'S SUMMARY: Patient admitted for shock (sepsis), after getting his first dose of  IVIG for suspected ITP as an outpatient, presented with fever, hypotension, confusion.  This AM, more alert, gave appropriate history.   Seen by Heme\Onc, plan to monitor plts, give GM-CSF, transfuse PRBC if needed, treat sepsis (possible PNA, bacteremia). Partial Code - no intubation, no CPR, may give pressors and anti-arrhythmics for acute disease process but not for code purposes.    I have personally obtained history, examined patient, evaluated and interpreted laboratory and imaging results, reviewed medical records, formulated assessment / plan and placed orders.  CRITICAL CARE:  The patient is critically ill with multiple organ systems failure and requires high complexity decision making for assessment and support, frequent evaluation and titration of therapies, application of advanced monitoring technologies and extensive interpretation of multiple databases. Critical Care Time devoted to patient care services described in this note is 45 minutes.   Vilinda Boehringer, MD Pulmonary and [place] Pager: [phone]  07/07/2014, 7:44 AM

## 2014-07-07 NOTE — Progress Notes (Signed)
Critical Lab Results given to oncoming RN Tiney Rouge.   Critical Lab: Positive blood cultures, both bottles. Gram positive cocci in chains  Time result given: 0938

## 2014-07-07 NOTE — Consult Note (Signed)
#   [id number] is consult note.  Justin E.  Justin [name]

## 2014-07-07 NOTE — Progress Notes (Signed)
  Echocardiogram 2D Echocardiogram has been performed.  Justin [name] FRANCES 07/07/2014, 4:34 PM

## 2014-07-08 ENCOUNTER — Encounter: Payer: Self-pay | Admitting: Hematology & Oncology

## 2014-07-08 DIAGNOSIS — D696 Thrombocytopenia, unspecified: Secondary | ICD-10-CM

## 2014-07-08 DIAGNOSIS — I059 Rheumatic mitral valve disease, unspecified: Secondary | ICD-10-CM

## 2014-07-08 DIAGNOSIS — B952 Enterococcus as the cause of diseases classified elsewhere: Secondary | ICD-10-CM | POA: Diagnosis present

## 2014-07-08 DIAGNOSIS — R509 Fever, unspecified: Secondary | ICD-10-CM

## 2014-07-08 DIAGNOSIS — R7881 Bacteremia: Secondary | ICD-10-CM

## 2014-07-08 LAB — GLUCOSE, CAPILLARY
GLUCOSE-CAPILLARY: 215 mg/dL — AB (ref 70–99)
Glucose-Capillary: 138 mg/dL — ABNORMAL HIGH (ref 70–99)
Glucose-Capillary: 191 mg/dL — ABNORMAL HIGH (ref 70–99)
Glucose-Capillary: 181 mg/dL — ABNORMAL HIGH (ref 70–99)

## 2014-07-08 LAB — BASIC METABOLIC PANEL
ANION GAP: 13 (ref 5–15)
BUN: 46 mg/dL — ABNORMAL HIGH (ref 6–23)
CALCIUM: 7.8 mg/dL — AB (ref 8.4–10.5)
CO2: 18 meq/L — AB (ref 19–32)
Chloride: 104 mEq/L (ref 96–112)
Creatinine, Ser: 1.55 mg/dL — ABNORMAL HIGH (ref 0.50–1.35)
GFR calc Af Amer: 47 mL/min — ABNORMAL LOW (ref >90)
GFR calc non Af Amer: 41 mL/min — ABNORMAL LOW (ref >90)
Glucose, Bld: 127 mg/dL — ABNORMAL HIGH (ref 70–99)
POTASSIUM: 3.8 meq/L (ref 3.7–5.3)
SODIUM: 135 meq/L — AB (ref 137–147)

## 2014-07-08 LAB — CBC WITH DIFFERENTIAL/PLATELET
Basophils Absolute: 0 10*3/uL (ref 0.0–0.1)
Basophils Relative: 0 % (ref 0–1)
EOS ABS: 0 10*3/uL (ref 0.0–0.7)
EOS PCT: 0 % (ref 0–5)
HCT: 28.3 % — ABNORMAL LOW (ref 39.0–52.0)
Hemoglobin: 9.6 g/dL — ABNORMAL LOW (ref 13.0–17.0)
Lymphocytes Relative: 5 % — ABNORMAL LOW (ref 12–46)
Lymphs Abs: 1 10*3/uL (ref 0.7–4.0)
MCH: 31.6 pg (ref 26.0–34.0)
MCHC: 33.9 g/dL (ref 30.0–36.0)
MCV: 93.1 fL (ref 78.0–100.0)
MONOS PCT: 3 % (ref 3–12)
Monocytes Absolute: 0.7 10*3/uL (ref 0.1–1.0)
NEUTROS PCT: 91 % — AB (ref 43–77)
Neutro Abs: 18 10*3/uL — ABNORMAL HIGH (ref 1.7–7.7)
PLATELETS: 8 10*3/uL — AB (ref 150–400)
RBC: 3.04 MIL/uL — ABNORMAL LOW (ref 4.22–5.81)
RDW: 16.1 % — ABNORMAL HIGH (ref 11.5–15.5)
WBC: 19.7 10*3/uL — ABNORMAL HIGH (ref 4.0–10.5)

## 2014-07-08 LAB — IGG, IGA, IGM
IGA: 430 mg/dL — AB (ref 68–379)
IGG (IMMUNOGLOBIN G), SERUM: 2100 mg/dL — AB (ref 650–1600)
IGM, SERUM: 119 mg/dL (ref 41–251)

## 2014-07-08 LAB — TYPE AND SCREEN
ABO/RH(D): A NEG
ANTIBODY SCREEN: NEGATIVE
Unit division: 0

## 2014-07-08 LAB — KAPPA/LAMBDA LIGHT CHAINS
KAPPA FREE LGHT CHN: 7.79 mg/dL — AB (ref 0.33–1.94)
Kappa, lambda light chain ratio: 1.16 (ref 0.26–1.65)
LAMDA FREE LIGHT CHAINS: 6.69 mg/dL — AB (ref 0.57–2.63)

## 2014-07-08 LAB — VANCOMYCIN, TROUGH: VANCOMYCIN TR: 17.7 ug/mL (ref 10.0–20.0)

## 2014-07-08 LAB — RETICULOCYTES
RBC.: 3.04 MIL/uL — AB (ref 4.22–5.81)
RETIC COUNT ABSOLUTE: 139.8 10*3/uL (ref 19.0–186.0)
Retic Ct Pct: 4.6 % — ABNORMAL HIGH (ref 0.4–3.1)

## 2014-07-08 LAB — LACTATE DEHYDROGENASE: LDH: 559 U/L — ABNORMAL HIGH (ref 94–250)

## 2014-07-08 LAB — PROCALCITONIN: Procalcitonin: 70.85 ng/mL

## 2014-07-08 MED ORDER — HYDROCORTISONE NA SUCCINATE PF 100 MG IJ SOLR
50.0000 mg | Freq: Two times a day (BID) | INTRAMUSCULAR | Status: AC
  Administered 2014-07-08 – 2014-07-09 (×3): 50 mg via INTRAVENOUS
  Filled 2014-07-08 (×4): qty 1

## 2014-07-08 MED ORDER — VITAMIN E 180 MG (400 UNIT) PO CAPS
400.0000 [IU] | ORAL_CAPSULE | Freq: Every day | ORAL | Status: DC
  Administered 2014-07-08 – 2014-07-23 (×15): 400 [IU] via ORAL
  Filled 2014-07-08 (×17): qty 1

## 2014-07-08 MED ORDER — FAMOTIDINE 20 MG PO TABS
20.0000 mg | ORAL_TABLET | Freq: Two times a day (BID) | ORAL | Status: DC
  Administered 2014-07-08 – 2014-07-09 (×3): 20 mg via ORAL
  Filled 2014-07-08 (×7): qty 1

## 2014-07-08 MED ORDER — POLYVINYL ALCOHOL 1.4 % OP SOLN
1.0000 [drp] | OPHTHALMIC | Status: DC | PRN
  Filled 2014-07-08: qty 15

## 2014-07-08 MED ORDER — TAMSULOSIN HCL 0.4 MG PO CAPS
0.4000 mg | ORAL_CAPSULE | Freq: Every day | ORAL | Status: DC
  Administered 2014-07-08 – 2014-07-23 (×15): 0.4 mg via ORAL
  Filled 2014-07-08 (×17): qty 1

## 2014-07-08 MED ORDER — HYDROCORTISONE NA SUCCINATE PF 100 MG IJ SOLR
25.0000 mg | Freq: Two times a day (BID) | INTRAMUSCULAR | Status: DC
  Filled 2014-07-08 (×2): qty 0.5

## 2014-07-08 MED ORDER — FUROSEMIDE 40 MG PO TABS
40.0000 mg | ORAL_TABLET | Freq: Two times a day (BID) | ORAL | Status: DC
  Administered 2014-07-08 – 2014-07-09 (×4): 40 mg via ORAL
  Filled 2014-07-08 (×7): qty 1

## 2014-07-08 NOTE — Progress Notes (Signed)
Justin [name] this morning. No surprise that he is growing gram-positive cocci in chains. This must be some kind of Streptococcus. To be interesting to see what the identification is of the bacteria. Apparently he is on vancomycin. He is on Zosyn. He is off pressors. His blood pressure is better.  He did get a dose of Nplate. His platelet count is 8000. He is not bleeding.  I looked at his blood smear. The few pleasures that he has are large. They are well granulated. I believe that they are very functional. He had a rare schistocytes. There was some polychromasia. There were no nucleated red cells. The myeloid cells had some toxic granulations.  His hemoglobin is 9.6. Get 1 unit of blood yesterday. His color does look better.  He is still quite weak. I think this probably will be the case for at least 4 weeks.  His renal function is stable. His LDH is on the high side. His corrected reticulocyte count is about 2.6%.  He is alert.  His echocardiogram looked pretty much unrevealing.     His vital signs are pretty stable. Temperature is 98.2. Blood pressure 147/57. Respiratory rate 25. Oxygen saturation is 100%. His lungs sound with some good breath sounds bilaterally. He has some scattered crackles. Cardiac exam regular in rhythm. Abdomen is soft. He has good bowel sounds. There is no fluid wave. There is no palpable liver or spleen tip. Extremities shows no clubbing cyanosis or edema. Neurological exam is non-focal. Skin exam does show some ecchymoses.  He has gram-positive bacteremia. We will have to see what the identification is on the organism.  I still believe that he has immune thrombocytopenia. His blood smear is consistent with this.  I do need to keep in the back of my mind the possibility of TTP. He does have the elevated LDH. He is not hemolyzing. His renal function is pretty stable. His blood smear shows a rare schistocytes.  We will continue to follow along closely.  I updated  his son.

## 2014-07-08 NOTE — Consult Note (Signed)
[facility]    Date of Admission:  07/28/2014           Day 3 vancomycin        Day 3 piperacillin tazobactam       Reason for Consult: Enterococcal bacteremia    Referring Physician: Automatic consultation for enterococcal bacteremia   Principal Problem:   Enterococcal bacteremia Active Problems:   CAD (coronary artery disease)   Anemia   Thrombocytopenia   Pacemaker-[employer]   Mitral regurgitation   Sepsis   . sodium chloride   Intravenous Once  . antiseptic oral rinse  7 mL Mouth Rinse BID  . famotidine  20 mg Oral BID  . furosemide  40 mg Oral BID  . hydrocortisone sod succinate (SOLU-CORTEF) inj  50 mg Intravenous BID   Followed by  . [START ON 07/10/2014] hydrocortisone sod succinate (SOLU-CORTEF) inj  25 mg Intravenous BID  . insulin aspart  1-3 Units Subcutaneous TID WC & HS  . pantoprazole  40 mg Oral Daily  . pyridOXINE  100 mg Oral QPM  . tamsulosin  0.4 mg Oral QPC supper  . vancomycin  750 mg Intravenous Q12H  . vitamin B-12  1,000 mcg Oral QPM    Recommendations: 1. Continue vancomycin 2. Discontinue piperacillin tazobactam 3. Await results of repeat blood cultures 4. Recommended cardiology consult to compare recent TTE's looking for evidence of endocarditis   Assessment: He was admitted with the acute onset of fever and has enterococcal bacteremia. He does not have a chronic indwelling IV line. A new IV catheter was started at the [facility] for his IVIG. The source of his bacteremia is unclear. I am concerned that his infection may be more subacute given his history and recent cardiac problems. He was scheduled to undergo TEE recently after seeing his cardiologist, Dr. Stanford Breed, but that was postponed because of his thrombocytopenia. His last TTE showed moderate mitral regurgitation. Today's TTE is reported to show moderate to severe mitral regurgitation. I will continue vancomycin alone pending final susceptibility  results. I may consider the addition of gentamicin if his kidney function is improving.    HPI: Justin [name] is a 80 y.o. male who has been in declining health for the past few months. He has had anorexia and some weight loss. He developed what sounds like an upper GI bleed with melena but no source of bleeding was found on upper and lower endoscopies. He has developed progressive thrombocytopenia. He was at the cancer center 2 days ago receiving his first infusion of IVIG for his thrombocytopenia. He developed high fever leading to admission here. He was started on broad empiric antibiotic therapy. Both sets of admission blood cultures are now growing enterococcus. He is now afebrile and he is feeling better.   Review of Systems: Constitutional: positive for anorexia, chills, fevers, malaise and weight loss, negative for sweats Eyes: negative Ears, nose, mouth, throat, and face: negative Respiratory: positive for dyspnea on exertion, negative for cough, hemoptysis, pleurisy/chest pain and sputum Cardiovascular: positive for dyspnea Gastrointestinal: positive for change in bowel habits and diarrhea, negative for abdominal pain and constipation Genitourinary:negative  Past Medical History  Diagnosis Date  . CAD (coronary artery disease) prior stenting 1999   . Dyslipidemia   . HTN (hypertension)   . WPW (Wolff-Parkinson-White syndrome) loss of preexcitation   . Atrial fibrillation   . AV block, Mobitz II   . Presyncope   .  Myocardial infarction   . Pacemaker 04/07/2013  . Sleep apnea     USES CPAP  . Arthritis   . History of chicken pox   . Hyperlipidemia   . Anemia   . Hypernatremia 06/03/2013  . Thrombocytopenia, unspecified 06/03/2013  . Leukopenia 06/03/2013  . Obstructive sleep apnea 06/03/2013  . Urinary incontinence 06/03/2013  . Atrial fibrillation   . Hyperglycemia 12/21/2013  . Pancytopenia 06/03/2013  . Iron deficiency anemia, unspecified 07/02/2014  . Malabsorption of iron  07/02/2014  . Hypotestosteronemia 07/02/2014  . ITP (idiopathic thrombocytopenic purpura) 07/02/2014    History  Substance Use Topics  . Smoking status: Former Smoker -- 1.00 packs/day for 36 years    Types: Cigarettes    Start date: 01/25/1955    Quit date: 12/05/1991  . Smokeless tobacco: Never Used     Comment: quit smoking 22 yeras ago  . Alcohol Use: Yes     Comment: Occasional    Family History  Problem Relation Age of Onset  . Stroke Mother   . Hypertension Mother   . Stroke Sister   . Arthritis Sister     rheumatoid  . Heart attack Sister   . Anemia Sister   . Other Brother     tube put in aorta  . Anemia Brother   . Other Son     cortisone deficiency  . Arthritis Son   . Stroke Brother   . Alcohol abuse Brother   . Barrett's esophagus Son   . Colon cancer Maternal Grandmother   . Colon cancer Maternal Grandfather    No Known Allergies  OBJECTIVE: Blood pressure 138/52, pulse 60, temperature 97.7 F (36.5 C), temperature source Oral, resp. rate 24, height 5\' 11"  (1.803 m), weight 198 lb 3.1 oz (89.9 kg), SpO2 100.00%. General: He is alert and in no distress sitting up in bed Skin: No acute rash or splinter hemorrhages Lungs: Clear Cor: Regular S1 and S2 with a 1/6 systolic murmur Abdomen: Soft and nontender Joints and extremities: No acute abnormalities  Lab Results Lab Results  Component Value Date   WBC 19.7* 07/08/2014   HGB 9.6* 07/08/2014   HCT 28.3* 07/08/2014   MCV 93.1 07/08/2014   PLT 8* 07/08/2014    Lab Results  Component Value Date   CREATININE 1.55* 07/08/2014   BUN 46* 07/08/2014   NA 135* 07/08/2014   K 3.8 07/08/2014   CL 104 07/08/2014   CO2 18* 07/08/2014    Lab Results  Component Value Date   ALT 39 07/26/2014   AST 77* 07/21/2014   ALKPHOS 106 08/01/2014   BILITOT 1.7* 07/21/2014     Microbiology: Recent Results (from the past 240 hour(s))  CULTURE, BLOOD (ROUTINE X 2)     Status: None   Collection Time    07/25/2014  3:30 PM      Result Value  Ref Range Status   Specimen Description BLOOD LEFT ARM   Final   Special Requests BOTTLES DRAWN AEROBIC AND ANAEROBIC [facility]   Final   Culture  Setup Time     Final   Value: 08/01/2014 19:23     Performed at [facility]   Culture     Final   Value: ENTEROCOCCUS SPECIES     Note: Gram Stain Report Called to,Read Back By and Verified With: JAMES ARTIS 07/07/14 AT 0730 [place]     Performed at [facility]   Report Status PENDING   Incomplete  URINE CULTURE  Status: None   Collection Time    08/01/2014  4:18 PM      Result Value Ref Range Status   Specimen Description URINE, CLEAN CATCH   Final   Special Requests NONE   Final   Culture  Setup Time     Final   Value: 07/07/2014 02:46     Performed at [facility]     Final   Value: 35,000 COLONIES/ML     Performed at [facility]   Culture     Final   Value: [place]     Performed at [facility]   Report Status PENDING   Incomplete  CULTURE, BLOOD (ROUTINE X 2)     Status: None   Collection Time    07/15/2014  4:30 PM      Result Value Ref Range Status   Specimen Description BLOOD R ARM   Final   Special Requests BOTTLES DRAWN AEROBIC AND ANAEROBIC [facility]   Final   Culture  Setup Time     Final   Value: 07/16/2014 19:22     Performed at [facility]   Culture     Final   Value: ENTEROCOCCUS SPECIES     Note: Gram Stain Report Called to,Read Back By and Verified With: JAMES ARTIS 07/07/14 AT 0730 [place]     Performed at [facility]   Report Status PENDING   Incomplete  MRSA PCR SCREENING     Status: None   Collection Time    07/18/2014  7:52 PM      Result Value Ref Range Status   MRSA by PCR NEGATIVE  NEGATIVE Final   Comment:            The GeneXpert MRSA Assay (FDA     approved for NASAL specimens     only), is one component of a     comprehensive MRSA colonization     surveillance program. It is not     intended to diagnose MRSA     infection  nor to guide or     monitor treatment for     MRSA infections.    Michel Bickers, MD [facility] [phone] pager   [phone] cell 07/08/2014, 5:06 PM

## 2014-07-08 NOTE — Progress Notes (Signed)
ANTIBIOTIC CONSULT NOTE - FOLLOW UP  Pharmacy Consult for Vancomycin + Zosyn Indication: Enterococcus Bacteremia  No Known Allergies  Patient Measurements: Height: 5\' 11"  (180.3 cm) Weight: 198 lb 3.1 oz (89.9 kg) IBW/kg (Calculated) : 75.3  Vital Signs: Temp: 97.5 F (36.4 C) (08/05 1200) Temp src: Oral (08/05 1200) BP: 138/52 mmHg (08/05 1200) Pulse Rate: 60 (08/05 1200) Intake/Output from previous day: 08/04 0701 - 08/05 0700 In: 2710 [P.O.:100; I.V.:1860; Blood:312.5; IV Piggyback:437.5] Out: 1625 [YSAYT:0160] Intake/Output from this shift: Total I/O In: 150 [I.V.:100; IV Piggyback:50] Out: 100 [Urine:100]  Labs:  Recent Labs  07/24/2014 1530 07/07/14 0235 07/08/14 0235  WBC 13.4* 24.8* 19.7*  HGB 9.8* 8.6* 9.6*  PLT 8* 11* 8*  CREATININE 1.50* 1.57* 1.55*   Estimated Creatinine Clearance: 41.2 ml/min (by C-G formula based on Cr of 1.55).  Recent Labs  07/08/14 1252  [place] 17.7     Microbiology: Recent Results (from the past 720 hour(s))  CULTURE, BLOOD (ROUTINE X 2)     Status: None   Collection Time    07/23/2014  3:30 PM      Result Value Ref Range Status   Specimen Description BLOOD LEFT ARM   Final   Special Requests BOTTLES DRAWN AEROBIC AND ANAEROBIC [facility]   Final   Culture  Setup Time     Final   Value: 07/04/2014 19:23     Performed at [facility]   Culture     Final   Value: ENTEROCOCCUS SPECIES     Note: Gram Stain Report Called to,Read Back By and Verified With: JAMES ARTIS 07/07/14 AT 0730 [place]     Performed at [facility]   Report Status PENDING   Incomplete  URINE CULTURE     Status: None   Collection Time    07/05/2014  4:18 PM      Result Value Ref Range Status   Specimen Description URINE, CLEAN CATCH   Final   Special Requests NONE   Final   Culture  Setup Time     Final   Value: 07/07/2014 02:46     Performed at [place]     Final   Value: 35,000 COLONIES/ML     Performed at  [facility]   Culture     Final   Value: [place]     Performed at [facility]   Report Status PENDING   Incomplete  CULTURE, BLOOD (ROUTINE X 2)     Status: None   Collection Time    07/12/2014  4:30 PM      Result Value Ref Range Status   Specimen Description BLOOD R ARM   Final   Special Requests BOTTLES DRAWN AEROBIC AND ANAEROBIC [place]   Final   Culture  Setup Time     Final   Value: 07/20/2014 19:22     Performed at [facility]   Culture     Final   Value: ENTEROCOCCUS SPECIES     Note: Gram Stain Report Called to,Read Back By and Verified With: JAMES ARTIS 07/07/14 AT 0730 [place]     Performed at [facility]   Report Status PENDING   Incomplete  MRSA PCR SCREENING     Status: None   Collection Time    07/05/2014  7:52 PM      Result Value Ref Range Status   MRSA by PCR NEGATIVE  NEGATIVE Final  Comment:            The GeneXpert MRSA Assay (FDA     approved for NASAL specimens     only), is one component of a     comprehensive MRSA colonization     surveillance program. It is not     intended to diagnose MRSA     infection nor to guide or     monitor treatment for     MRSA infections.    Anti-infectives   Start     Dose/Rate Route Frequency Ordered Stop   07/07/14 0030  piperacillin-tazobactam (ZOSYN) IVPB 3.375 g     3.375 g 12.5 mL/hr over 240 Minutes Intravenous Every 8 hours 07/11/2014 2042     07/20/2014 2300  levofloxacin (LEVAQUIN) IVPB 750 mg  Status:  Discontinued     750 mg 100 mL/hr over 90 Minutes Intravenous Every 48 hours 07/13/2014 2232 07/08/14 1022   07/31/2014 1615  piperacillin-tazobactam (ZOSYN) IVPB 3.375 g     3.375 g 100 mL/hr over 30 Minutes Intravenous  Once 07/10/2014 1604 07/30/2014 1755   07/14/2014 1615  vancomycin (VANCOCIN) IVPB 1000 mg/200 mL premix     1,000 mg 200 mL/hr over 60 Minutes Intravenous  Once 07/29/2014 1604 07/22/2014 1930   07/13/2014 0030  vancomycin (VANCOCIN) IVPB 750 mg/150 ml premix      750 mg 150 mL/hr over 60 Minutes Intravenous Every 12 hours 07/24/2014 2042        Assessment: 81 YOM on day # 3 of vancomycin and Zosyn for enterococcus bacteremia (sensitivities pending). Patient with pacer and will likely need treatment long-term for presumed endocarditis (ECHO negative for vegetations; unable to perform TEE). WBC trending down (19.7). Currently patient is afebrile.  Vancomycin trough is therapeutic at 17.7.   Goal of Therapy:  Vancomycin trough level 15-20 mcg/ml  Plan:  1. Continue vancomycin 750mg  IV q12h. 2. Continue Zosyn 3.375g IV q8h- 4hr infusion. 3. Consider addition of gentamicin with pacer leads. ID to see patient.  Sloan Leiter, PharmD, BCPS Clinical Pharmacist [phone] 07/08/2014,2:57 PM

## 2014-07-08 NOTE — Progress Notes (Signed)
PULMONARY / CRITICAL CARE MEDICINE   Name: Justin [name] MRN: [id number] DOB: 20-May-1933    ADMISSION DATE:  07/20/2014 CONSULTATION DATE:  07/12/2014  REFERRING MD :  EDP  CHIEF COMPLAINT:  Dyspnea  INITIAL PRESENTATION: 80 yo male with diastolic heart failure currently treated for ITP with systemic steroids  and on IVIG presents to [facility] 8/3 with dyspnea and fever. Hypotensive requiring vasopressors.  STUDIES:  6/24 TTE >>> LVEF 55%, Grade 2 DD 8/3   CT chest >>> bilateral effusions, bilateral GGO, several nodular opacities  8/4 ECHO >>> Mild to mod MR, EF 50-55%, G2 Dia  Dys Fcn  SIGNIFICANT EVENTS:  No acute overnight events, family at bedside (concerns addressed), mild sob and weakness.   KEY EVENTS: 8/3 - admitted with SOB, Fever\Chills, fatigue after 1st IVIG infusion for suspected ITP 8/4 - got one dose of NPLATE (GM-CSF)  HISTORY OF PRESENT ILLNESS:  80 y.o. male with history of coronary artery disease status post stents, hypertension, dyslipidemia, atrial fibrillation no longer on anticoagulation secondary to recent GI bleed, pacemaker, ITP currently being treated with IVIG by Dr. Marin Olp (most recently 8/3) and prednisone 80mg  daily, who presents to the emergency department with complaints of fever and shortness of breath that started 8/2. In ED found to have leukocytosis, hypotension, and lactic acidosis. Given 1L IVF bolus and started on levophed. Transferred to MICU for suspected sepsis.   PAST MEDICAL HISTORY :  Past Medical History  Diagnosis Date  . CAD (coronary artery disease) prior stenting 1999   . Dyslipidemia   . HTN (hypertension)   . WPW (Wolff-Parkinson-White syndrome) loss of preexcitation   . Atrial fibrillation   . AV block, Mobitz II   . Presyncope   . Myocardial infarction   . Pacemaker 04/07/2013  . Sleep apnea     USES CPAP  . Arthritis   . History of chicken pox   . Hyperlipidemia   . Anemia   . Hypernatremia 06/03/2013  . Thrombocytopenia,  unspecified 06/03/2013  . Leukopenia 06/03/2013  . Obstructive sleep apnea 06/03/2013  . Urinary incontinence 06/03/2013  . Atrial fibrillation   . Hyperglycemia 12/21/2013  . Pancytopenia 06/03/2013  . Iron deficiency anemia, unspecified 07/02/2014  . Malabsorption of iron 07/02/2014  . Hypotestosteronemia 07/02/2014  . ITP (idiopathic thrombocytopenic purpura) 07/02/2014   Past Surgical History  Procedure Laterality Date  . Coronary angioplasty with stent placement    . Vastectomy    . Insert / replace / remove pacemaker  04/07/2013   Prior to Admission medications   Medication Sig Start Date End Date Taking? Authorizing Provider  atorvastatin (LIPITOR) 10 MG tablet Take 1 tablet (10 mg total) by mouth every evening. 03/18/14   Lelon Perla, MD  Azelaic Acid (FINACEA) 15 % cream Apply 1 application topically daily. After skin is thoroughly washed and patted dry, gently but thoroughly massage a thin film of azelaic acid creame    Historical Provider, MD  Cholecalciferol (VITAMIN D-3) 1000 UNITS CAPS Take 1 capsule by mouth daily.    Historical Provider, MD  fluticasone (FLONASE) 50 MCG/ACT nasal spray Place 1 spray into the nose 2 (two) times daily. For seasonal allergies    Historical Provider, MD  furosemide (LASIX) 20 MG tablet Take 3 tablets (60 mg total) by mouth daily. 06/11/14   Lelon Perla, MD  losartan (COZAAR) 50 MG tablet Take 1 tablet (50 mg total) by mouth daily. 06/03/14   Lelon Perla, MD  Multiple Vitamin (  MULTIVITAMIN WITH MINERALS) TABS Take 1 tablet by mouth 2 (two) times a week.     Historical Provider, MD  nadolol (CORGARD) 20 MG tablet Take 10 mg by mouth daily. 03/18/14   Lelon Perla, MD  nitroGLYCERIN (NITROSTAT) 0.4 MG SL tablet Place 1 tablet (0.4 mg total) under the tongue every 5 (five) minutes as needed for chest pain. x3 doses as needed for chest pain 03/18/14   Lelon Perla, MD  omeprazole (PRILOSEC) 20 MG capsule Take 20 mg by mouth daily as needed (for  heartburn).  04/28/14   Mosie Lukes, MD  Polyethyl Glycol-Propyl Glycol (SYSTANE ULTRA OP) Apply 1 drop to eye 4 (four) times daily as needed (for dry eyes).    Historical Provider, MD  predniSONE (DELTASONE) 20 MG tablet Take 4 pills a day with food 07/03/14   Volanda Napoleon, MD  Probiotic Product (PROBIOTIC DAILY PO) Take 1 capsule by mouth daily.     Historical Provider, MD  pyridOXINE (VITAMIN B-6) 100 MG tablet Take 100 mg by mouth every evening.    Historical Provider, MD  tamsulosin (FLOMAX) 0.4 MG CAPS capsule Take 0.4 mg by mouth at bedtime.    Historical Provider, MD  vitamin B-12 (CYANOCOBALAMIN) 1000 MCG tablet Take 1,000 mcg by mouth every evening.    Historical Provider, MD   No Known Allergies  FAMILY HISTORY:  Family History  Problem Relation Age of Onset  . Stroke Mother   . Hypertension Mother   . Stroke Sister   . Arthritis Sister     rheumatoid  . Heart attack Sister   . Anemia Sister   . Other Brother     tube put in aorta  . Anemia Brother   . Other Son     cortisone deficiency  . Arthritis Son   . Stroke Brother   . Alcohol abuse Brother   . Barrett's esophagus Son   . Colon cancer Maternal Grandmother   . Colon cancer Maternal Grandfather    SOCIAL HISTORY:  reports that he quit smoking about 22 years ago. His smoking use included Cigarettes. He started smoking about 59 years ago. He has a 36 pack-year smoking history. He has never used smokeless tobacco. He reports that he drinks alcohol. He reports that he does not use illicit drugs.  REVIEW OF SYSTEMS:   Bolds are positive  Constitutional: weight loss, gain, night sweats, Fevers, chills, fatigue .  HEENT: headaches, Sore throat, sneezing, nasal congestion, post nasal drip, Difficulty swallowing, Tooth/dental problems, visual complaints visual changes, ear ache CV:  chest pain, radiates: ,Orthopnea, PND, swelling in lower extremities, dizziness, palpitations, syncope.  GI  heartburn, indigestion,  abdominal pain, nausea, vomiting, diarrhea, change in bowel habits, loss of appetite, bloody stools.  Resp: cough, productive: , hemoptysis, dyspnea, chest pain, pleuritic.  Skin: rash or itching or icterus GU: dysuria, change in color of urine, urgency or frequency. flank pain, hematuria  MS: joint pain or swelling. decreased range of motion  Psych: change in mood or affect. depression or anxiety.  Neuro: difficulty with speech, weakness, numbness, ataxia, confusion  INTERVAL HISTORY:   VITAL SIGNS: Temp:  [97.7 F (36.5 C)-98.2 F (36.8 C)] 97.8 F (36.6 C) (08/05 0848) Pulse Rate:  [45-68] 63 (08/05 0800) Resp:  [13-29] 23 (08/05 0800) BP: (95-157)/(42-94) 143/68 mmHg (08/05 0800) SpO2:  [98 %-100 %] 98 % (08/05 0800) Weight:  [198 lb 3.1 oz (89.9 kg)] 198 lb 3.1 oz (89.9 kg) (08/05 0400)  INTAKE / OUTPUT: Intake/Output     08/04 0701 - 08/05 0700 08/05 0701 - 08/06 0700   P.O. 100    I.V. (mL/kg) 1860 (20.7) 100 (1.1)   Blood 312.5    IV Piggyback 437.5 50   Total Intake(mL/kg) 2710 (30.1) 150 (1.7)   Urine (mL/kg/hr) 1625 (0.8) 50 (0.2)   Total Output 1625 50   Net +1085 +100          PHYSICAL EXAMINATION: General:  Elderly male of normal body habitus in NAD Neuro:  Alert, oriented, mild confusion HEENT:  [place]/AT, PERRL, No JVD noted Cardiovascular:  RRR, No MRG Lungs:  Mild coarse upper airway sounds, mild dec b\l breath sounds, good effort, good air entry Abdomen:  Soft, non-tender, non-distended Musculoskeletal:  No acute deformity or ROM limitation.  Skin:  Trace pitting edema BLE.   LABS:  CBC  Recent Labs Lab 07/13/2014 1530 07/07/14 0235 07/08/14 0235  WBC 13.4* 24.8* 19.7*  HGB 9.8* 8.6* 9.6*  HCT 30.4* 26.1* 28.3*  PLT 8* 11* 8*   Coag's  Recent Labs Lab 07/21/2014 2140  INR 1.48    BMET  Recent Labs Lab 07/17/2014 1530 07/07/14 0235 07/08/14 0235  NA 136* 133* 135*  K 4.6 4.4 3.8  CL 99 101 104  CO2 24 18* 18*  BUN 45* 48* 46*   CREATININE 1.50* 1.57* 1.55*  GLUCOSE 130* 147* 127*   Electrolytes  Recent Labs Lab 07/12/2014 1530 07/22/2014 2315 07/07/14 0235 07/08/14 0235  CALCIUM 8.9  --  7.4* 7.8*  MG  --  2.0  --   --    Sepsis Markers  Recent Labs Lab 07/28/2014 1537 07/29/2014 2140 07/07/14 0235 07/08/14 0235  LATICACIDVEN 2.66*  --  2.6*  --   PROCALCITON  --  59.31 90.83 70.85   ABG No results found for this basename: PHART, PCO2ART, PO2ART,  in the last 168 hours  Liver Enzymes  Recent Labs Lab 07/20/2014 1530  AST 77*  ALT 39  ALKPHOS 106  BILITOT 1.7*  ALBUMIN 2.6*   Cardiac Enzymes  Recent Labs Lab 07/19/2014 1530 07/25/2014 2140  TROPONINI 0.68* 1.59*  PROBNP 12303.0*  --    Glucose  Recent Labs Lab 08/01/2014 1953 07/07/14 1149 07/07/14 1731 07/07/14 2204 07/08/14 0828  GLUCAP 98 151* 144* 131* 138*   IMAGING:  Dg Chest Port 1 View  07/07/2014   CLINICAL DATA:  Evaluate airspace.  EXAM: PORTABLE CHEST - 1 VIEW  COMPARISON:  CT 07/25/2014.  Chest x-ray 07/14/2014 and 06/10/2014.  FINDINGS: Cardiac pacer with lead tips in right atrium and right ventricle. Mediastinum and hilar structures are unremarkable. Cardiomegaly with mild pulmonary vascular prominence and interstitial prominence consistent with congestive heart failure. Small pleural effusions. No pneumothorax. Reference is made to recent chest CT which demonstrated small pulmonary nodules. These are not definitely identified by chest x-ray. No acute bony abnormality.  IMPRESSION: 1. Persistent changes of congestive heart failure pulmonary edema. Small pleural effusions. 2. Reference is made to recent chest CT which demonstrated small pulmonary nodules. Follow-up is recommended as on prior CT report.   Electronically Signed   By: Marcello Moores  Register   On: 07/07/2014 07:26   ECHO 2D TTE (07/07/14) Study Conclusions  - Left ventricle: The cavity size was mildly dilated. Systolic function was normal. The estimated ejection fraction  was in the range of 50% to 55%. Wall motion was normal; there were no regional wall motion abnormalities. Features are consistent witha pseudonormal left ventricular  filling pattern, with concomitant abnormal relaxation and increased filling pressure (grade 2 diastolic dysfunction). Doppler parameters are consistent with elevated ventricular end-diastolic filling pressure.  - Aortic valve: Trileaflet; mildly thickened leaflets. There was moderate regurgitation. - Mitral valve: There was moderate to severe regurgitation. - Right ventricle: Systolic function was mildly reduced. - Tricuspid valve: There was moderate regurgitation. - Pulmonary arteries: Systolic pressure was severely increased. PA peak pressure: 65 mm Hg (S).  Impressions:  - Mildly dilated left ventricle with normal systolic function and grade 2 diastolic dysfunction. Moderate aortic insufficiency, moderate to severe mitral and moderate tricusid regurgitation. Mildly reduced RV systolic function. Severe pulmonary hypertension.  LINES / TUBES:   CULTURES:  8/3 BCx2 >> GPC in chains  8/3 urine>>GPC in chains (35 K)  ANTIBIOTICS:  Zosyn 8/3  Vanc 8/3  Levaquin 8/3 >>8/5    ASSESSMENT / PLAN:  PULMONARY A: Possible pneumonia vs TRALI from IVIG Bilateral effusions - stable No evidence of acute pulmonary edema Minimal oxygen requirements P:   Goal SpO2>92 Cont with Zosyn, Vanc (D\C levaquin today) Supplemental oxygen Hold IVIG, staredt on GM-CSF by Heme\Onc (Dr. Marin Olp)  CARDIOVASCULAR A:  SIRS / sepsis Hypotension in setting of hypovolemia, infection, suspected adrenal insufficiency - now resolving Chronic diastolic heart failure - on lasix at home H/o PAF, now in SR demand ischemia P:  MAP goal > 65 Off pressors,  Hold outpatient Losartan, Nadolol ASA contraindicated - severe thrombocytopenia BB / ACEI contraindicated - hypotension TTE results as stated above (mild to mod MR, G2 Dia dysfcn, EF  50-55%) Restart lasix (40mg  BID)  RENAL A:   AKI - improving mildly  Hypovolemia  P:   Trend BMP Got 2.5L total bolus 8/4, off pressors, still a little dry, with AKI,  Started eating, will d\c IVF BMP in the AM.   GASTROINTESTINAL A:   GERD Nutrition  P:   Protonix Start heart healthy diet today  HEMATOLOGIC A:   ITP, first IVIG treatment 8/3, on chronic prednisone Anemia VTE prophylaxis P:  Follow CBC Dr. Marin Olp following, will defer to Heme\Onc on need for chronic prednisone (currently only on a tapering dose of solu-cortef).  SCDs Conitnue Folate, Vit B  INFECTIOUS A:   Possible pneumonia Doubt meningitis - more alert and awake today P:   Vancomycin 8/3 Zosyn 8/3 Levaquin 8/3>>8/5  PCT Defer LP as very low platelets  ENDOCRINE A:   Suspected adrenal insufficiency ( chronic steroids ) P:   Cont with Stress dose steroids ( Hydrocortisone 50 mg q6h), start weaning today  NEUROLOGIC A:   Acute encephalopathy - resolved Acute delirium - resolved Non focal P:   Avoid sedatives / hypnotics If focal findings >>> head CT Most likely related to IVIG   TODAY'S SUMMARY: Patient admitted for shock (sepsis), after getting his first dose of IVIG for suspected ITP as an outpatient, presented with fever, hypotension, confusion.  Partial Code - no intubation, no CPR, may give pressors and anti-arrhythmics for acute disease process but not for code purposes. Transfer to [facility].   I have personally obtained history, examined patient, evaluated and interpreted laboratory and imaging results, reviewed medical records, formulated assessment / plan and placed orders.  CRITICAL CARE:  The patient is critically ill with multiple organ systems failure and requires high complexity decision making for assessment and support, frequent evaluation and titration of therapies, application of advanced monitoring technologies and extensive interpretation of multiple  databases. Critical Care Time devoted to patient care services described  in this note is 45 minutes.   Vilinda Boehringer, MD Pulmonary and [place] Pager: [phone]  07/08/2014, 10:16 AM

## 2014-07-09 ENCOUNTER — Inpatient Hospital Stay (HOSPITAL_COMMUNITY): Payer: Medicare Other

## 2014-07-09 ENCOUNTER — Encounter (HOSPITAL_COMMUNITY): Payer: Self-pay | Admitting: Physician Assistant

## 2014-07-09 DIAGNOSIS — I5032 Chronic diastolic (congestive) heart failure: Secondary | ICD-10-CM

## 2014-07-09 DIAGNOSIS — I4891 Unspecified atrial fibrillation: Secondary | ICD-10-CM

## 2014-07-09 DIAGNOSIS — N390 Urinary tract infection, site not specified: Secondary | ICD-10-CM

## 2014-07-09 LAB — COMPREHENSIVE METABOLIC PANEL
ALK PHOS: 75 U/L (ref 39–117)
ALT: 53 U/L (ref 0–53)
AST: 64 U/L — ABNORMAL HIGH (ref 0–37)
Albumin: 2.1 g/dL — ABNORMAL LOW (ref 3.5–5.2)
Anion gap: 11 (ref 5–15)
BILIRUBIN TOTAL: 1.4 mg/dL — AB (ref 0.3–1.2)
BUN: 43 mg/dL — ABNORMAL HIGH (ref 6–23)
CO2: 22 mEq/L (ref 19–32)
CREATININE: 1.57 mg/dL — AB (ref 0.50–1.35)
Calcium: 8.4 mg/dL (ref 8.4–10.5)
Chloride: 106 mEq/L (ref 96–112)
GFR calc Af Amer: 47 mL/min — ABNORMAL LOW (ref >90)
GFR, EST NON AFRICAN AMERICAN: 40 mL/min — AB (ref >90)
GLUCOSE: 141 mg/dL — AB (ref 70–99)
Potassium: 3.8 mEq/L (ref 3.7–5.3)
Sodium: 139 mEq/L (ref 137–147)
Total Protein: 6.5 g/dL (ref 6.0–8.3)

## 2014-07-09 LAB — PROTEIN ELECTROPHORESIS, SERUM
ALPHA-2-GLOBULIN: 10.2 % (ref 7.1–11.8)
Albumin ELP: 40.2 % — ABNORMAL LOW (ref 55.8–66.1)
Alpha-1-Globulin: 7.3 % — ABNORMAL HIGH (ref 2.9–4.9)
Beta 2: 6.7 % — ABNORMAL HIGH (ref 3.2–6.5)
Beta Globulin: 5.1 % (ref 4.7–7.2)
GAMMA GLOBULIN: 30.5 % — AB (ref 11.1–18.8)
M-Spike, %: NOT DETECTED g/dL
TOTAL PROTEIN ELP: 6.1 g/dL (ref 6.0–8.3)

## 2014-07-09 LAB — GLUCOSE, CAPILLARY
GLUCOSE-CAPILLARY: 126 mg/dL — AB (ref 70–99)
Glucose-Capillary: 144 mg/dL — ABNORMAL HIGH (ref 70–99)
Glucose-Capillary: 147 mg/dL — ABNORMAL HIGH (ref 70–99)
Glucose-Capillary: 158 mg/dL — ABNORMAL HIGH (ref 70–99)

## 2014-07-09 LAB — CBC
HEMATOCRIT: 32.1 % — AB (ref 39.0–52.0)
HEMOGLOBIN: 10.8 g/dL — AB (ref 13.0–17.0)
MCH: 31.5 pg (ref 26.0–34.0)
MCHC: 33.6 g/dL (ref 30.0–36.0)
MCV: 93.6 fL (ref 78.0–100.0)
Platelets: 10 10*3/uL — CL (ref 150–400)
RBC: 3.43 MIL/uL — ABNORMAL LOW (ref 4.22–5.81)
RDW: 16.8 % — ABNORMAL HIGH (ref 11.5–15.5)
WBC: 16.7 10*3/uL — AB (ref 4.0–10.5)

## 2014-07-09 LAB — URINE CULTURE: Colony Count: 35000

## 2014-07-09 LAB — CULTURE, BLOOD (ROUTINE X 2)

## 2014-07-09 LAB — CHROMOSOME ANALYSIS, BONE MARROW

## 2014-07-09 LAB — TISSUE HYBRIDIZATION (BONE MARROW)-NCBH

## 2014-07-09 MED ORDER — POTASSIUM CHLORIDE CRYS ER 20 MEQ PO TBCR
20.0000 meq | EXTENDED_RELEASE_TABLET | Freq: Once | ORAL | Status: AC
  Administered 2014-07-09: 20 meq via ORAL

## 2014-07-09 MED ORDER — IOHEXOL 300 MG/ML  SOLN
25.0000 mL | INTRAMUSCULAR | Status: AC
  Administered 2014-07-09 (×2): 25 mL via ORAL

## 2014-07-09 NOTE — Progress Notes (Signed)
Mr. Hinks is looking a whole lot better. He, unfortunately, is growing both enterococcus and Enterobacter in his urine and blood. The enterococcus is in his blood and Enterobacter is in the urine. We are awaiting sensitivities. He is on Levaquin, Zosyn, and vancomycin.  He does have a pacemaker in. This, I think, may dictate how long he needs antibiotic therapy.  He just looks better. His color is better. He's sitting up in bed.  He will start physical therapy.  His appetite is still down a little bit. He is afebrile. His blood pressure is fantastic at 131/63.  He's not had any bleeding.  There is no vomiting. He's had no diarrhea.  He did have a colonoscopy recently. I think this is very helpful, because enterococcus in the blood has been show to indicate a higher risk of having colon cancer. I do think that a CT of the abdomen and pelvis still would be helpful.  His son and daughter were in the room today. I answered all their questions.  On his exam, his lungs sounded good with some decrease in the bases. He had some crackles at the bases. Cardiac exam regular rate and rhythm. He over 6 systolic ejection murmur. Abdomen is soft. He is good bowel sounds. There is no guarding or rebound tenderness. Extremities shows muscle atrophy in upper and lower cervix. He has some trace edema in his legs. Skin exam shows scattered ecchymoses.  He'll be very interesting to see whether his platelets now that he is on treatment for the bacteremia and urosepsis. I wonder if he had some type of "subclinical" infection that was leading to platelet destruction.  I spent about 30 minutes or so with he and his family. I tried to get her as requested as possible.  I would think that he is going to need outpatient antibiotics. If so, a PICC line will need to be put in.  Lum Keas  Psalm 32:8

## 2014-07-09 NOTE — Progress Notes (Addendum)
[facility] PROGRESS NOTE  Justin [name] ZOX:[id number] DOB: [birth year] DOA: 07/22/2014 PCP: Penni Homans, MD  Assessment/Plan: 80 yo male with diastolic heart failure currently treated for ITP with systemic steroids and on IVIG presents to [facility] 8/3 with dyspnea and fever. Hypotensive requiring vasopressors.  Enterococcus Bacteremia: On IV vancomycin day 3. Blood Culture 8-5 no growth to date. Needs to follow blood culture result to place PICC line. Cardiology consulted for review ECHO , evaluation for endocarditis. WBC trending down. CT abdomen pelvis ordered.   Possible pneumonia vs TRALI from IVIG  Bilateral effusions - stable Continue with Lasix. Monitor renal function.   Elevated troponin: in setting sepsis. ECHO; normal Wall motion. Cardiology to follow.   Enterobacter Urine: 30,000 colonies.   Chronic diastolic heart failure - on lasix at home Restart lasix (40mg  BID) on 8-5.  Urine out put 2 L on 8-5.  Sepsis: enterococcus bacteremia. Received pressors (Levophed). BP stable. Continue with Vancomycin.   AKI:in setting of infection. Monitor on Lasix.   Thrombocytopenia, ITP, first IVIG treatment 8/3, on chronic prednisone. Platelet at 10. Dr Marin Olp following.   Suspected Adrenal insufficiency ( chronic steroids )  Cont with Stress dose steroids ( Hydrocortisone 50 mg q6h), start weaning 8-5. Need to resume chronic steroid, will follow Dr Marin Olp recommendation.   H/o PAF, now in SR   Code Status: Partial: yes to antiarrythmic and vasopressors.  Family Communication:  Disposition Plan:    Consultants:  ID  Cardiology.   Procedures: 6/24 TTE >>> LVEF 55%, Grade 2 DD  8/3 CT chest >>> bilateral effusions, bilateral GGO, several nodular opacities  8/4 ECHO >>> Mild to mod MR, EF 50-55%, G2 Dia Dys Fcn   Antibiotics:  Vancomycin 8-3  HPI/Subjective: Related dyspnea on exertion, feels has improved some.   Objective: Filed Vitals:   07/09/14 0845   BP: 142/71  Pulse: 67  Temp: 97.7 F (36.5 C)  Resp: 18    Intake/Output Summary (Last 24 hours) at 07/09/14 1356 Last data filed at 07/09/14 1314  Gross per 24 hour  Intake   1110 ml  Output   1975 ml  Net   -865 ml   Filed Weights   07/08/14 0400 07/08/14 2139 07/09/14 0541  Weight: 89.9 kg (198 lb 3.1 oz) 87.59 kg (193 lb 1.6 oz) 87.6 kg (193 lb 2 oz)    Exam:   General:  No distress.   Cardiovascular: S 1, S 2 IRR. JVD.   Respiratory: decrease breath sound bases, B/L crackles.   Abdomen: Bs present, soft, NT  Musculoskeletal: plus 2 edema.   Data Reviewed: Basic Metabolic Panel:  Recent Labs Lab 07/18/2014 1530 07/08/2014 2315 07/07/14 0235 07/08/14 0235 07/09/14 0945  NA 136*  --  133* 135* 139  K 4.6  --  4.4 3.8 3.8  CL 99  --  101 104 106  CO2 24  --  18* 18* 22  GLUCOSE 130*  --  147* 127* 141*  BUN 45*  --  48* 46* 43*  CREATININE 1.50*  --  1.57* 1.55* 1.57*  CALCIUM 8.9  --  7.4* 7.8* 8.4  MG  --  2.0  --   --   --    Liver Function Tests:  Recent Labs Lab 07/16/2014 1530 07/09/14 0945  AST 77* 64*  ALT 39 53  ALKPHOS 106 75  BILITOT 1.7* 1.4*  PROT 7.8 6.5  ALBUMIN 2.6* 2.1*   No results found for this basename: LIPASE, AMYLASE,  in the last 168 hours No results found for this basename: AMMONIA,  in the last 168 hours CBC:  Recent Labs Lab 07/03/14 1118 07/30/2014 0833 07/07/2014 1530 07/07/14 0235 07/08/14 0235 07/09/14 0945  WBC 7.4 14.0* 13.4* 24.8* 19.7* 16.7*  NEUTROABS 5.8 12.5* 12.4*  --  18.0*  --   HGB 9.4* 9.5* 9.8* 8.6* 9.6* 10.8*  HCT 28.7* 28.8* 30.4* 26.1* 28.3* 32.1*  MCV 96 97 96.5 94.9 93.1 93.6  PLT 14 Platelet count consistent in citrate* 10* 8* 11* 8* 10*   Cardiac Enzymes:  Recent Labs Lab 07/26/2014 1530 07/12/2014 2140  TROPONINI 0.68* 1.59*   BNP (last 3 results)  Recent Labs  08/01/2014 1530  PROBNP 12303.0*   CBG:  Recent Labs Lab 07/08/14 1155 07/08/14 1559 07/08/14 2105 07/09/14 0629  07/09/14 1122  GLUCAP 215* 191* 181* 126* 144*    Recent Results (from the past 240 hour(s))  CULTURE, BLOOD (ROUTINE X 2)     Status: None   Collection Time    08/01/2014  3:30 PM      Result Value Ref Range Status   Specimen Description BLOOD LEFT ARM   Final   Special Requests BOTTLES DRAWN AEROBIC AND ANAEROBIC [facility]   Final   Culture  Setup Time     Final   Value: 07/08/2014 19:23     Performed at [facility]   Culture     Final   Value: ENTEROCOCCUS SPECIES     Note: SUSCEPTIBILITIES PERFORMED ON PREVIOUS CULTURE WITHIN THE LAST 5 DAYS.     Note: Gram Stain Report Called to,Read Back By and Verified With: JAMES ARTIS 07/07/14 AT 0730 RIDK     Performed at [facility]   Report Status 07/09/2014 FINAL   Final  URINE CULTURE     Status: None   Collection Time    07/24/2014  4:18 PM      Result Value Ref Range Status   Specimen Description URINE, CLEAN CATCH   Final   Special Requests NONE   Final   Culture  Setup Time     Final   Value: 07/07/2014 02:46     Performed at [place]     Final   Value: 35,000 COLONIES/ML     Performed at [facility]   Culture     Final   Value: ENTEROBACTER CLOACAE     Performed at [facility]   Report Status 07/09/2014 FINAL   Final   Organism ID, Bacteria ENTEROBACTER CLOACAE   Final  CULTURE, BLOOD (ROUTINE X 2)     Status: None   Collection Time    07/21/2014  4:30 PM      Result Value Ref Range Status   Specimen Description BLOOD R ARM   Final   Special Requests BOTTLES DRAWN AEROBIC AND ANAEROBIC [place]   Final   Culture  Setup Time     Final   Value: 07/10/2014 19:22     Performed at [facility]   Culture     Final   Value: ENTEROCOCCUS SPECIES     Note: Gram Stain Report Called to,Read Back By and Verified With: JAMES ARTIS 07/07/14 AT 0730 RIDK     Performed at [facility]   Report Status 07/09/2014 FINAL   Final   Organism ID, Bacteria ENTEROCOCCUS  SPECIES   Final  MRSA PCR SCREENING     Status: None  Collection Time    07/26/2014  7:52 PM      Result Value Ref Range Status   MRSA by PCR NEGATIVE  NEGATIVE Final   Comment:            The GeneXpert MRSA Assay (FDA     approved for NASAL specimens     only), is one component of a     comprehensive MRSA colonization     surveillance program. It is not     intended to diagnose MRSA     infection nor to guide or     monitor treatment for     MRSA infections.  CULTURE, BLOOD (ROUTINE X 2)     Status: None   Collection Time    07/08/14 12:00 PM      Result Value Ref Range Status   Specimen Description BLOOD RIGHT HAND   Final   Special Requests BOTTLES DRAWN AEROBIC ONLY [facility]   Final   Culture  Setup Time     Final   Value: 07/08/2014 17:14     Performed at [facility]   Culture     Final   Value:        BLOOD CULTURE RECEIVED NO GROWTH TO DATE CULTURE WILL BE HELD FOR 5 DAYS BEFORE ISSUING A FINAL NEGATIVE REPORT     Performed at [facility]   Report Status PENDING   Incomplete  CULTURE, BLOOD (ROUTINE X 2)     Status: None   Collection Time    07/08/14 12:10 PM      Result Value Ref Range Status   Specimen Description BLOOD LEFT HAND   Final   Special Requests BOTTLES DRAWN AEROBIC ONLY [facility]   Final   Culture  Setup Time     Final   Value: 07/08/2014 17:05     Performed at [facility]   Culture     Final   Value:        BLOOD CULTURE RECEIVED NO GROWTH TO DATE CULTURE WILL BE HELD FOR 5 DAYS BEFORE ISSUING A FINAL NEGATIVE REPORT     Performed at [facility]   Report Status PENDING   Incomplete     Studies: Ct Abdomen Pelvis Wo Contrast  07/09/2014   CLINICAL DATA:  Enterococcus bacteremia. Evaluate for abdominal source. Evaluate for potential tumor. Enterobacter is also present in the urine.  EXAM: CT ABDOMEN AND PELVIS WITHOUT CONTRAST  TECHNIQUE: Multidetector CT imaging of the abdomen and pelvis was performed following the standard  protocol without IV contrast.  COMPARISON:  No priors.  FINDINGS: Lung Bases: Atherosclerotic calcifications in the right coronary artery. Pacemaker lead terminating in the right ventricular apex. Moderate-sized hiatal hernia. Moderate to large bilateral pleural effusions which appear to layer dependently and are associated with extensive passive atelectasis in the visualized portions of the lower lobes of the lungs bilaterally.  Abdomen/Pelvis: 9 mm low attenuation lesion in segment 2 of the liver is incompletely characterized on today's noncontrast CT examination, but is unchanged, and favored to represent a tiny cyst. No other focal hepatic lesions are identified on today's non contrast CT examination. There is a tiny calcifications in the head of the pancreas (image 31 of series 2) which appears to be immediately posterior to the distal common bile duct, potentially related to prior episodes of pancreatitis. Other small calcifications are also noted in the head and tail of the pancreas as well. The calcification in the head of the  pancreas is not favored to be within the common bile duct, as the common bile duct is normal in caliber measuring 4 mm, and there is no intrahepatic biliary ductal dilatation to suggest obstruction. The unenhanced appearance of the gallbladder, spleen and bilateral adrenal glands is unremarkable. There is bilateral perinephric stranding which is nonspecific. 1.5 cm intermediate attenuation lesion extending exophytically off the lateral aspect of the upper pole of the right kidney is incompletely characterized on today's non contrast CT examination, but is statistically likely to represent a mildly proteinaceous cyst.  Normal appendix. Trace volume of ascites. No pneumoperitoneum. No pathologic distention of small bowel. 10 mm short axis inguinal lymph nodes bilaterally are nonspecific. No other definite lymphadenopathy identified in the abdomen or pelvis on today's non contrast CT  examination. Mild diffuse mesenteric edema. Prostate gland is enlarged measuring 4.9 x 6.1 cm. Urinary bladder is generally unremarkable in appearance, however, the attenuation values in the urine are rather high ranging from 45-55 HU, suggesting proteinaceous or hemorrhagic contents.  Musculoskeletal: Mild diffuse body wall edema. There are no aggressive appearing lytic or blastic lesions noted in the visualized portions of the skeleton.  IMPRESSION: 1. No definite source for bacteremia identified on today's examination. 2. Relatively high attenuation of the urine is of uncertain etiology and significance, but may suggest some proteinaceous or hemorrhagic contents. 3. Findings suggestive of anasarca, including moderate to large bilateral pleural effusions, trace volume of ascites, mild diffuse mesenteric edema and diffuse body wall edema. 4. Prostatomegaly. 5. Additional incidental findings, as above.   Electronically Signed   By: Vinnie Langton M.D.   On: 07/09/2014 12:46    Scheduled Meds: . sodium chloride   Intravenous Once  . antiseptic oral rinse  7 mL Mouth Rinse BID  . famotidine  20 mg Oral BID  . furosemide  40 mg Oral BID  . hydrocortisone sod succinate (SOLU-CORTEF) inj  50 mg Intravenous BID   Followed by  . [START ON 07/10/2014] hydrocortisone sod succinate (SOLU-CORTEF) inj  25 mg Intravenous BID  . insulin aspart  1-3 Units Subcutaneous TID WC & HS  . pantoprazole  40 mg Oral Daily  . pyridOXINE  100 mg Oral QPM  . tamsulosin  0.4 mg Oral QPC supper  . vancomycin  750 mg Intravenous Q12H  . vitamin B-12  1,000 mcg Oral QPM  . vitamin E  400 Units Oral Daily   Continuous Infusions: . sodium chloride Stopped (07/08/14 0849)    Principal Problem:   Enterococcal bacteremia Active Problems:   CAD (coronary artery disease)   Anemia   Thrombocytopenia   Pacemaker-[employer]   Mitral regurgitation   Sepsis    Time spent: 35 minutes.     Niel Hummer A  [facility] Pager [phone]. If 7PM-7AM, please contact night-coverage at [email or web address], password [facility] 07/09/2014, 1:56 PM  LOS: 3 days

## 2014-07-09 NOTE — Evaluation (Signed)
Physical Therapy Evaluation Patient Details Name: Justin [name] MRN: [id number] DOB: 05/24/1933 Today's Date: 07/09/2014   History of Present Illness  Pt is a 80 y.o. male with history of CAD status post stents, hypertension, dyslipidemia, a-fib, pacemaker, ITP currently being treated with IVIG, who presents to the emergency department with complaints of fever and shortness of breath that started 8/2. In ED found to have leukocytosis, hypotension, and lactic acidosis. Transferred to MICU for suspected sepsis.   Clinical Impression  Pt admitted with the above. Pt currently with functional limitations due to the deficits listed below (see PT Problem List). At the time of PT eval pt was able to perform ambulation without an AD, and became somewhat SOB with gait training, however was more so SOB with MMT. Pt will benefit from skilled PT to increase their independence and safety with mobility to allow discharge to the venue listed below.       Follow Up Recommendations Home health PT;Supervision for mobility/OOB    Equipment Recommendations  Rolling walker with 5" wheels    Recommendations for Other Services       Precautions / Restrictions Precautions Precautions: Fall Restrictions Weight Bearing Restrictions: No      Mobility  Bed Mobility               General bed mobility comments: Pt sitting in chair upon PT arrival.   Transfers Overall transfer level: Needs assistance Equipment used: None Transfers: Sit to/from Stand Sit to Stand: Min guard         General transfer comment: VC's for hand placement on seated surface for safety. No assist to power-up to full standing.   Ambulation/Gait Ambulation/Gait assistance: Min guard Ambulation Distance (Feet): 50 Feet Assistive device: None Gait Pattern/deviations: Step-through pattern;Decreased stride length;Trunk flexed Gait velocity: Decreased Gait velocity interpretation: Below normal speed for age/gender General Gait  Details: VC's for general safety awareness. Pt fatigues quickly with standing activity. Steadying assist only.  Stairs            Wheelchair Mobility    Modified Rankin (Stroke Patients Only)       Balance Overall balance assessment: Needs assistance Sitting-balance support: Feet supported;No upper extremity supported Sitting balance-Leahy Scale: Good     Standing balance support: No upper extremity supported Standing balance-Leahy Scale: Fair                               Pertinent Vitals/Pain      Home Living Family/patient expects to be discharged to:: Private residence (Independent living) Living Arrangements: Spouse/significant other Available Help at Discharge: Family;Available PRN/intermittently Type of Home: House Home Access: Level entry     Home Layout: One level Home Equipment: Walker - 2 wheels;Cane - single point;Shower seat - built in;Grab bars - toilet;Grab bars - tub/shower;Toilet riser      Prior Function Level of Independence: Independent         Comments: Still driving, does most of the grocery shopping, minimal cooking - meals provided,.     Hand Dominance   Dominant Hand: Right    Extremity/Trunk Assessment   Upper Extremity Assessment: Overall WFL for tasks assessed           Lower Extremity Assessment: Overall WFL for tasks assessed      Cervical / Trunk Assessment: Normal  Communication   Communication: No difficulties  Cognition Arousal/Alertness: Awake/alert Behavior During Therapy: WFL for tasks assessed/performed Overall Cognitive Status:  Within Functional Limits for tasks assessed                      General Comments General comments (skin integrity, edema, etc.): Became SOB during MMT moreso than during ambulation.    Exercises        Assessment/Plan    PT Assessment Patient needs continued PT services  PT Diagnosis Difficulty walking;Generalized weakness   PT Problem List  Decreased strength;Decreased range of motion;Decreased activity tolerance;Decreased balance;Decreased mobility;Decreased knowledge of use of DME;Decreased safety awareness;Decreased knowledge of precautions;Cardiopulmonary status limiting activity  PT Treatment Interventions DME instruction;Gait training;Stair training;Functional mobility training;Therapeutic activities;Therapeutic exercise;Neuromuscular re-education;Patient/family education   PT Goals (Current goals can be found in the Care Plan section) Acute Rehab PT Goals Patient Stated Goal: To return home with wife PT Goal Formulation: With patient/family Time For Goal Achievement: 07/16/14 Potential to Achieve Goals: Good    Frequency Min 3X/week   Barriers to discharge        Co-evaluation               End of Session Equipment Utilized During Treatment: Gait belt Activity Tolerance: Patient limited by fatigue Patient left: with call bell/phone within reach;with family/visitor present (Sitting EOB) Nurse Communication: Mobility status         Time: 1112-1140 PT Time Calculation (min): 28 min   Charges:   PT Evaluation $Initial PT Evaluation Tier I: 1 Procedure PT Treatments $Gait Training: 8-22 mins $Therapeutic Activity: 8-22 mins   PT G CodesJolyn Lent 07/09/2014, 5:34 PM  Jolyn Lent, PT, DPT Acute Rehabilitation Services Pager: [phone]

## 2014-07-09 NOTE — Progress Notes (Signed)
Patient ID: Justin [name], male   DOB: [birth year], 80 y.o.   MRN: [id number]         [facility]    Date of Admission:  07/24/2014           Day 4 vancomycin  Principal Problem:   Enterococcal bacteremia Active Problems:   CAD (coronary artery disease)   Anemia   Thrombocytopenia   Pacemaker-[employer]   Mitral regurgitation   Sepsis   . sodium chloride   Intravenous Once  . antiseptic oral rinse  7 mL Mouth Rinse BID  . famotidine  20 mg Oral BID  . furosemide  40 mg Oral BID  . hydrocortisone sod succinate (SOLU-CORTEF) inj  50 mg Intravenous BID   Followed by  . [START ON 07/10/2014] hydrocortisone sod succinate (SOLU-CORTEF) inj  25 mg Intravenous BID  . insulin aspart  1-3 Units Subcutaneous TID WC & HS  . pantoprazole  40 mg Oral Daily  . pyridOXINE  100 mg Oral QPM  . tamsulosin  0.4 mg Oral QPC supper  . vancomycin  750 mg Intravenous Q12H  . vitamin B-12  1,000 mcg Oral QPM  . vitamin E  400 Units Oral Daily    Subjective: He is feeling much better today.  Objective: Temp:  [97.4 F (36.3 C)-97.7 F (36.5 C)] 97.7 F (36.5 C) (08/06 0845) Pulse Rate:  [60-68] 67 (08/06 0845) Resp:  [18-32] 18 (08/06 0845) BP: (118-150)/(49-78) 142/71 mmHg (08/06 0845) SpO2:  [94 %-99 %] 98 % (08/06 0845) Weight:  [193 lb 1.6 oz (87.59 kg)-193 lb 2 oz (87.6 kg)] 193 lb 2 oz (87.6 kg) (08/06 0541)  General: He is sitting on the side of his bed visiting with family Skin: Sclera conjunctival hemorrhages Lungs: Clear Cor: Regular S1 and S2. I do not hear a murmur today Abdomen: Soft and nontender  Lab Results Lab Results  Component Value Date   WBC 16.7* 07/09/2014   HGB 10.8* 07/09/2014   HCT 32.1* 07/09/2014   MCV 93.6 07/09/2014   PLT 10* 07/09/2014    Lab Results  Component Value Date   CREATININE 1.57* 07/09/2014   BUN 43* 07/09/2014   NA 139 07/09/2014   K 3.8 07/09/2014   CL 106 07/09/2014   CO2 22 07/09/2014    Lab Results  Component Value Date     ALT 53 07/09/2014   AST 64* 07/09/2014   ALKPHOS 75 07/09/2014   BILITOT 1.4* 07/09/2014      Microbiology: Recent Results (from the past 240 hour(s))  CULTURE, BLOOD (ROUTINE X 2)     Status: None   Collection Time    07/22/2014  3:30 PM      Result Value Ref Range Status   Specimen Description BLOOD LEFT ARM   Final   Special Requests BOTTLES DRAWN AEROBIC AND ANAEROBIC [facility]   Final   Culture  Setup Time     Final   Value: 07/25/2014 19:23     Performed at [facility]   Culture     Final   Value: ENTEROCOCCUS SPECIES     Note: SUSCEPTIBILITIES PERFORMED ON PREVIOUS CULTURE WITHIN THE LAST 5 DAYS.     Note: Gram Stain Report Called to,Read Back By and Verified With: JAMES ARTIS 07/07/14 AT 0730 [place]     Performed at [facility]   Report Status 07/09/2014 FINAL   Final  URINE CULTURE     Status: None   Collection  Time    07/05/2014  4:18 PM      Result Value Ref Range Status   Specimen Description URINE, CLEAN CATCH   Final   Special Requests NONE   Final   Culture  Setup Time     Final   Value: 07/07/2014 02:46     Performed at [place]     Final   Value: 35,000 COLONIES/ML     Performed at [facility]   Culture     Final   Value: ENTEROBACTER CLOACAE     Performed at [facility]   Report Status 07/09/2014 FINAL   Final   Organism ID, Bacteria ENTEROBACTER CLOACAE   Final  CULTURE, BLOOD (ROUTINE X 2)     Status: None   Collection Time    07/25/2014  4:30 PM      Result Value Ref Range Status   Specimen Description BLOOD R ARM   Final   Special Requests BOTTLES DRAWN AEROBIC AND ANAEROBIC [facility]   Final   Culture  Setup Time     Final   Value: 07/13/2014 19:22     Performed at [facility]   Culture     Final   Value: ENTEROCOCCUS SPECIES     Note: Gram Stain Report Called to,Read Back By and Verified With: JAMES ARTIS 07/07/14 AT 0730 [place]     Performed at [facility]   Report Status 07/09/2014  FINAL   Final   Organism ID, Bacteria ENTEROCOCCUS SPECIES   Final  MRSA PCR SCREENING     Status: None   Collection Time    07/11/2014  7:52 PM      Result Value Ref Range Status   MRSA by PCR NEGATIVE  NEGATIVE Final   Comment:            The GeneXpert MRSA Assay (FDA     approved for NASAL specimens     only), is one component of a     comprehensive MRSA colonization     surveillance program. It is not     intended to diagnose MRSA     infection nor to guide or     monitor treatment for     MRSA infections.  CULTURE, BLOOD (ROUTINE X 2)     Status: None   Collection Time    07/08/14 12:00 PM      Result Value Ref Range Status   Specimen Description BLOOD RIGHT HAND   Final   Special Requests BOTTLES DRAWN AEROBIC ONLY [facility]   Final   Culture  Setup Time     Final   Value: 07/08/2014 17:14     Performed at [facility]   Culture     Final   Value:        BLOOD CULTURE RECEIVED NO GROWTH TO DATE CULTURE WILL BE HELD FOR 5 DAYS BEFORE ISSUING A FINAL NEGATIVE REPORT     Performed at [facility]   Report Status PENDING   Incomplete  CULTURE, BLOOD (ROUTINE X 2)     Status: None   Collection Time    07/08/14 12:10 PM      Result Value Ref Range Status   Specimen Description BLOOD LEFT HAND   Final   Special Requests BOTTLES DRAWN AEROBIC ONLY [facility]   Final   Culture  Setup Time     Final   Value: 07/08/2014 17:05  Performed at [facility]     Final   Value:        BLOOD CULTURE RECEIVED NO GROWTH TO DATE CULTURE WILL BE HELD FOR 5 DAYS BEFORE ISSUING A FINAL NEGATIVE REPORT     Performed at [facility]   Report Status PENDING   Incomplete    Studies/Results: Ct Abdomen Pelvis Wo Contrast  07/09/2014   CLINICAL DATA:  Enterococcus bacteremia. Evaluate for abdominal source. Evaluate for potential tumor. Enterobacter is also present in the urine.  EXAM: CT ABDOMEN AND PELVIS WITHOUT CONTRAST  TECHNIQUE: Multidetector CT imaging of the  abdomen and pelvis was performed following the standard protocol without IV contrast.  COMPARISON:  No priors.  FINDINGS: Lung Bases: Atherosclerotic calcifications in the right coronary artery. Pacemaker lead terminating in the right ventricular apex. Moderate-sized hiatal hernia. Moderate to large bilateral pleural effusions which appear to layer dependently and are associated with extensive passive atelectasis in the visualized portions of the lower lobes of the lungs bilaterally.  Abdomen/Pelvis: 9 mm low attenuation lesion in segment 2 of the liver is incompletely characterized on today's noncontrast CT examination, but is unchanged, and favored to represent a tiny cyst. No other focal hepatic lesions are identified on today's non contrast CT examination. There is a tiny calcifications in the head of the pancreas (image 31 of series 2) which appears to be immediately posterior to the distal common bile duct, potentially related to prior episodes of pancreatitis. Other small calcifications are also noted in the head and tail of the pancreas as well. The calcification in the head of the pancreas is not favored to be within the common bile duct, as the common bile duct is normal in caliber measuring 4 mm, and there is no intrahepatic biliary ductal dilatation to suggest obstruction. The unenhanced appearance of the gallbladder, spleen and bilateral adrenal glands is unremarkable. There is bilateral perinephric stranding which is nonspecific. 1.5 cm intermediate attenuation lesion extending exophytically off the lateral aspect of the upper pole of the right kidney is incompletely characterized on today's non contrast CT examination, but is statistically likely to represent a mildly proteinaceous cyst.  Normal appendix. Trace volume of ascites. No pneumoperitoneum. No pathologic distention of small bowel. 10 mm short axis inguinal lymph nodes bilaterally are nonspecific. No other definite lymphadenopathy identified  in the abdomen or pelvis on today's non contrast CT examination. Mild diffuse mesenteric edema. Prostate gland is enlarged measuring 4.9 x 6.1 cm. Urinary bladder is generally unremarkable in appearance, however, the attenuation values in the urine are rather high ranging from 45-55 HU, suggesting proteinaceous or hemorrhagic contents.  Musculoskeletal: Mild diffuse body wall edema. There are no aggressive appearing lytic or blastic lesions noted in the visualized portions of the skeleton.  IMPRESSION: 1. No definite source for bacteremia identified on today's examination. 2. Relatively high attenuation of the urine is of uncertain etiology and significance, but may suggest some proteinaceous or hemorrhagic contents. 3. Findings suggestive of anasarca, including moderate to large bilateral pleural effusions, trace volume of ascites, mild diffuse mesenteric edema and diffuse body wall edema. 4. Prostatomegaly. 5. Additional incidental findings, as above.   Electronically Signed   By: Vinnie Langton M.D.   On: 07/09/2014 12:46    Assessment: He is improving on therapy for enterococcal bacteremia. Repeat blood cultures are pending. No obvious source for the bacteremia has been found.  Plan: 1. Continue vancomycin for now 2. Await results of repeat blood cultures  3. Cardiology evaluation 4. Monitor her creatinine and platelets  Michel Bickers, MD [facility] [place] [phone] pager   [phone] cell 07/09/2014, 1:50 PM

## 2014-07-09 NOTE — Consult Note (Signed)
Reason for Consult:   Referring Physician: Primary Cardiologist:  Kirk Ruths  Justin [name] is an 80 y.o. male.  HPI:   The patient is a 80 yo male who is being treated for ITP by Dr. Marin Olp.   He has a history of CAD, Chronic diastolic CHF, dyslipidemia, HTN, WPW, Afib(was taking apixaban but stopped due to GIB), Mobitz II, PPM, OSA-CPAP, Hypotestosteronemia, iron deficiency anemia.  He had a previous LAD infarct and had PCI of his LAD in April of 1999. Carotid Dopplers in April of 2014 showed 0-39% bilateral stenosis. Followup recommended in 2 years. Last nuclear study in April of 2014 showed no scar or ischemia. There was possible atypical flutter in recovery. Abdominal ultrasound in May 2014 showed no aneurysm. Previous monitor showed symptomatic 2:1 AV block and paroxysmal atrial fibrillation. Patient had a Medtronic dual- chamber pacemaker implanted May 2014.   He was seen recently for increased dyspnea. Chest x-ray June 2015 showed bilateral pleural effusions and vascular congestion. Echocardiogram in June of 2015 showed normal LV function, grade 2 diastolic dysfunction, mild aortic insufficiency, moderate mitral regurgitation, mild left atrial enlargement.  Note he apparently has had an EGD and colonoscopy for anemia which has been essentially unrevealing.  TSH in May 2015 normal.  His Lasix was incerased On July 1 to 35m daily.    He presented with fever and SOB.  Blood cultures were positive for enterococcus and urine for enterobacter.  We are asked to consult and compare the two most recent echos as the MR seems more severe.  Concern for endocarditis.  He had an echo on 07/07/14 which revealed EF of 50-55%, G2DD, normal wall motion, mod to sev MR, mod TR, mod AI, peak PA pressure of 614mg.  He has had some dyspnea and LEE.  Lasix was restarted and these symptoms are improving.   He reports feeling a lot better and currently denies nausea, vomiting, fever, chest pain, orthopnea,  dizziness, PND, cough, congestion, abdominal pain, hematochezia, melena.     Past Medical History  Diagnosis Date  . CAD (coronary artery disease) prior stenting 1999   . Dyslipidemia   . HTN (hypertension)   . WPW (Wolff-Parkinson-White syndrome) loss of preexcitation   . Atrial fibrillation   . AV block, Mobitz II   . Presyncope   . Myocardial infarction   . Pacemaker 04/07/2013  . Arthritis   . History of chicken pox   . Anemia   . Hypernatremia 06/03/2013  . Thrombocytopenia, unspecified 06/03/2013  . Leukopenia 06/03/2013  . Obstructive sleep apnea 06/03/2013    USES CPAP  . Urinary incontinence 06/03/2013  . Hyperglycemia 12/21/2013  . Pancytopenia 06/03/2013  . Iron deficiency anemia, unspecified 07/02/2014  . Malabsorption of iron 07/02/2014  . Hypotestosteronemia 07/02/2014  . ITP (idiopathic thrombocytopenic purpura) 07/02/2014    Past Surgical History  Procedure Laterality Date  . Coronary angioplasty with stent placement    . Vastectomy    . Insert / replace / remove pacemaker  04/07/2013    Family History  Problem Relation Age of Onset  . Stroke Mother   . Hypertension Mother   . Stroke Sister   . Arthritis Sister     rheumatoid  . Heart attack Sister   . Anemia Sister   . Other Brother     tube put in aorta  . Anemia Brother   . Other Son     cortisone deficiency  . Arthritis Son   . Stroke  Brother   . Alcohol abuse Brother   . Barrett's esophagus Son   . Colon cancer Maternal Grandmother   . Colon cancer Maternal Grandfather     Social History:  reports that he quit smoking about 22 years ago. His smoking use included Cigarettes. He started smoking about 59 years ago. He has a 36 pack-year smoking history. He has never used smokeless tobacco. He reports that he drinks alcohol. He reports that he does not use illicit drugs.  Allergies: No Known Allergies  Medications:  Scheduled Meds: . sodium chloride   Intravenous Once  . antiseptic oral rinse  7 mL  Mouth Rinse BID  . famotidine  20 mg Oral BID  . furosemide  40 mg Oral BID  . hydrocortisone sod succinate (SOLU-CORTEF) inj  50 mg Intravenous BID   Followed by  . [START ON 07/10/2014] hydrocortisone sod succinate (SOLU-CORTEF) inj  25 mg Intravenous BID  . insulin aspart  1-3 Units Subcutaneous TID WC & HS  . pantoprazole  40 mg Oral Daily  . pyridOXINE  100 mg Oral QPM  . tamsulosin  0.4 mg Oral QPC supper  . vancomycin  750 mg Intravenous Q12H  . vitamin B-12  1,000 mcg Oral QPM  . vitamin E  400 Units Oral Daily   Continuous Infusions: . sodium chloride Stopped (07/08/14 0849)   PRN Meds:.sodium chloride, acetaminophen, polyvinyl alcohol   Results for orders placed during the hospital encounter of 07/27/2014 (from the past 48 hour(s))  GLUCOSE, CAPILLARY     Status: Abnormal   Collection Time    07/07/14  5:31 PM      Result Value Ref Range   Glucose-Capillary 144 (*) 70 - 99 mg/dL  GLUCOSE, CAPILLARY     Status: Abnormal   Collection Time    07/07/14 10:04 PM      Result Value Ref Range   Glucose-Capillary 131 (*) 70 - 99 mg/dL  BASIC METABOLIC PANEL     Status: Abnormal   Collection Time    07/08/14  2:35 AM      Result Value Ref Range   Sodium 135 (*) 137 - 147 mEq/L   Potassium 3.8  3.7 - 5.3 mEq/L   Chloride 104  96 - 112 mEq/L   CO2 18 (*) 19 - 32 mEq/L   Glucose, Bld 127 (*) 70 - 99 mg/dL   BUN 46 (*) 6 - 23 mg/dL   Creatinine, Ser 1.55 (*) 0.50 - 1.35 mg/dL   Calcium 7.8 (*) 8.4 - 10.5 mg/dL   GFR calc non Af Amer 41 (*) >90 mL/min   GFR calc Af Amer 47 (*) >90 mL/min   Comment: (NOTE)     The eGFR has been calculated using the CKD EPI equation.     This calculation has not been validated in all clinical situations.     eGFR's persistently <90 mL/min signify possible Chronic Kidney     Disease.   Anion gap 13  5 - 15  CBC WITH DIFFERENTIAL     Status: Abnormal   Collection Time    07/08/14  2:35 AM      Result Value Ref Range   WBC 19.7 (*) 4.0 - 10.5  K/uL   RBC 3.04 (*) 4.22 - 5.81 MIL/uL   Hemoglobin 9.6 (*) 13.0 - 17.0 g/dL   HCT 28.3 (*) 39.0 - 52.0 %   MCV 93.1  78.0 - 100.0 fL   MCH 31.6  26.0 - 34.0 pg  MCHC 33.9  30.0 - 36.0 g/dL   RDW 16.1 (*) 11.5 - 15.5 %   Platelets 8 (*) 150 - 400 K/uL   Comment: PLATELET COUNT CONFIRMED BY SMEAR     CRITICAL VALUE NOTED.  VALUE IS CONSISTENT WITH PREVIOUSLY REPORTED AND CALLED VALUE.   Neutrophils Relative % 91 (*) 43 - 77 %   Neutro Abs 18.0 (*) 1.7 - 7.7 K/uL   Lymphocytes Relative 5 (*) 12 - 46 %   Lymphs Abs 1.0  0.7 - 4.0 K/uL   Monocytes Relative 3  3 - 12 %   Monocytes Absolute 0.7  0.1 - 1.0 K/uL   Eosinophils Relative 0  0 - 5 %   Eosinophils Absolute 0.0  0.0 - 0.7 K/uL   Basophils Relative 0  0 - 1 %   Basophils Absolute 0.0  0.0 - 0.1 K/uL  PROCALCITONIN     Status: None   Collection Time    07/08/14  2:35 AM      Result Value Ref Range   Procalcitonin 70.85     Comment:            Interpretation:     PCT >= 10 ng/mL:     Important systemic inflammatory response,     almost exclusively due to severe bacterial     sepsis or septic shock.     (NOTE)             ICU PCT Algorithm               Non ICU PCT Algorithm        ----------------------------     ------------------------------             PCT < 0.25 ng/mL                 PCT < 0.1 ng/mL         Stopping of antibiotics            Stopping of antibiotics           strongly encouraged.               strongly encouraged.        ----------------------------     ------------------------------           PCT level decrease by               PCT < 0.25 ng/mL           >= 80% from peak PCT           OR PCT 0.25 - 0.5 ng/mL          Stopping of antibiotics                                                 encouraged.         Stopping of antibiotics               encouraged.        ----------------------------     ------------------------------           PCT level decrease by              PCT >= 0.25 ng/mL            < 80% from peak PCT  AND PCT >= 0.5 ng/mL            Continuing antibiotics                                                  encouraged.           Continuing antibiotics                encouraged.        ----------------------------     ------------------------------         PCT level increase compared          PCT > 0.5 ng/mL             with peak PCT AND              PCT >= 0.5 ng/mL             Escalation of antibiotics                                              strongly encouraged.          Escalation of antibiotics            strongly encouraged.  LACTATE DEHYDROGENASE     Status: Abnormal   Collection Time    07/08/14  2:35 AM      Result Value Ref Range   LDH 559 (*) 94 - 250 U/L  RETICULOCYTES     Status: Abnormal   Collection Time    07/08/14  2:35 AM      Result Value Ref Range   Retic Ct Pct 4.6 (*) 0.4 - 3.1 %   RBC. 3.04 (*) 4.22 - 5.81 MIL/uL   Retic Count, Manual 139.8  19.0 - 186.0 K/uL  GLUCOSE, CAPILLARY     Status: Abnormal   Collection Time    07/08/14  8:28 AM      Result Value Ref Range   Glucose-Capillary 138 (*) 70 - 99 mg/dL  GLUCOSE, CAPILLARY     Status: Abnormal   Collection Time    07/08/14 11:55 AM      Result Value Ref Range   Glucose-Capillary 215 (*) 70 - 99 mg/dL  CULTURE, BLOOD (ROUTINE X 2)     Status: None   Collection Time    07/08/14 12:00 PM      Result Value Ref Range   Specimen Description BLOOD RIGHT HAND     Special Requests BOTTLES DRAWN AEROBIC ONLY 6CC     Culture  Setup Time       Value: 07/08/2014 17:14     Performed at [facility]   Culture       Value:        BLOOD CULTURE RECEIVED NO GROWTH TO DATE CULTURE WILL BE HELD FOR 5 DAYS BEFORE ISSUING A FINAL NEGATIVE REPORT     Performed at [facility]   Report Status PENDING    CULTURE, BLOOD (ROUTINE X 2)     Status: None   Collection Time    07/08/14 12:10 PM      Result Value Ref Range   Specimen Description BLOOD LEFT HAND     Special  Requests BOTTLES  DRAWN AEROBIC ONLY 6CC     Culture  Setup Time       Value: 07/08/2014 17:05     Performed at [facility]   Culture       Value:        BLOOD CULTURE RECEIVED NO GROWTH TO DATE CULTURE WILL BE HELD FOR 5 DAYS BEFORE ISSUING A FINAL NEGATIVE REPORT     Performed at [facility]   Report Status PENDING    VANCOMYCIN, TROUGH     Status: None   Collection Time    07/08/14 12:52 PM      Result Value Ref Range   Vancomycin Tr 17.7  10.0 - 20.0 ug/mL  GLUCOSE, CAPILLARY     Status: Abnormal   Collection Time    07/08/14  3:59 PM      Result Value Ref Range   Glucose-Capillary 191 (*) 70 - 99 mg/dL  GLUCOSE, CAPILLARY     Status: Abnormal   Collection Time    07/08/14  9:05 PM      Result Value Ref Range   Glucose-Capillary 181 (*) 70 - 99 mg/dL  GLUCOSE, CAPILLARY     Status: Abnormal   Collection Time    07/09/14  6:29 AM      Result Value Ref Range   Glucose-Capillary 126 (*) 70 - 99 mg/dL  CBC     Status: Abnormal   Collection Time    07/09/14  9:45 AM      Result Value Ref Range   WBC 16.7 (*) 4.0 - 10.5 K/uL   RBC 3.43 (*) 4.22 - 5.81 MIL/uL   Hemoglobin 10.8 (*) 13.0 - 17.0 g/dL   HCT 32.1 (*) 39.0 - 52.0 %   MCV 93.6  78.0 - 100.0 fL   MCH 31.5  26.0 - 34.0 pg   MCHC 33.6  30.0 - 36.0 g/dL   RDW 16.8 (*) 11.5 - 15.5 %   Platelets 10 (*) 150 - 400 K/uL   Comment: CONSISTENT WITH PREVIOUS RESULT     CRITICAL VALUE NOTED.  VALUE IS CONSISTENT WITH PREVIOUSLY REPORTED AND CALLED VALUE.  COMPREHENSIVE METABOLIC PANEL     Status: Abnormal   Collection Time    07/09/14  9:45 AM      Result Value Ref Range   Sodium 139  137 - 147 mEq/L   Potassium 3.8  3.7 - 5.3 mEq/L   Chloride 106  96 - 112 mEq/L   CO2 22  19 - 32 mEq/L   Glucose, Bld 141 (*) 70 - 99 mg/dL   BUN 43 (*) 6 - 23 mg/dL   Creatinine, Ser 1.57 (*) 0.50 - 1.35 mg/dL   Calcium 8.4  8.4 - 10.5 mg/dL   Total Protein 6.5  6.0 - 8.3 g/dL   Albumin 2.1 (*) 3.5 - 5.2 g/dL   AST  64 (*) 0 - 37 U/L   ALT 53  0 - 53 U/L   Alkaline Phosphatase 75  39 - 117 U/L   Total Bilirubin 1.4 (*) 0.3 - 1.2 mg/dL   GFR calc non Af Amer 40 (*) >90 mL/min   GFR calc Af Amer 47 (*) >90 mL/min   Comment: (NOTE)     The eGFR has been calculated using the CKD EPI equation.     This calculation has not been validated in all clinical situations.     eGFR's persistently <90 mL/min signify possible Chronic Kidney     Disease.  Anion gap 11  5 - 15  GLUCOSE, CAPILLARY     Status: Abnormal   Collection Time    07/09/14 11:22 AM      Result Value Ref Range   Glucose-Capillary 144 (*) 70 - 99 mg/dL   Comment 1 Notify RN      Ct Abdomen Pelvis Wo Contrast  07/09/2014   CLINICAL DATA:  Enterococcus bacteremia. Evaluate for abdominal source. Evaluate for potential tumor. Enterobacter is also present in the urine.  EXAM: CT ABDOMEN AND PELVIS WITHOUT CONTRAST  TECHNIQUE: Multidetector CT imaging of the abdomen and pelvis was performed following the standard protocol without IV contrast.  COMPARISON:  No priors.  FINDINGS: Lung Bases: Atherosclerotic calcifications in the right coronary artery. Pacemaker lead terminating in the right ventricular apex. Moderate-sized hiatal hernia. Moderate to large bilateral pleural effusions which appear to layer dependently and are associated with extensive passive atelectasis in the visualized portions of the lower lobes of the lungs bilaterally.  Abdomen/Pelvis: 9 mm low attenuation lesion in segment 2 of the liver is incompletely characterized on today's noncontrast CT examination, but is unchanged, and favored to represent a tiny cyst. No other focal hepatic lesions are identified on today's non contrast CT examination. There is a tiny calcifications in the head of the pancreas (image 31 of series 2) which appears to be immediately posterior to the distal common bile duct, potentially related to prior episodes of pancreatitis. Other small calcifications are also  noted in the head and tail of the pancreas as well. The calcification in the head of the pancreas is not favored to be within the common bile duct, as the common bile duct is normal in caliber measuring 4 mm, and there is no intrahepatic biliary ductal dilatation to suggest obstruction. The unenhanced appearance of the gallbladder, spleen and bilateral adrenal glands is unremarkable. There is bilateral perinephric stranding which is nonspecific. 1.5 cm intermediate attenuation lesion extending exophytically off the lateral aspect of the upper pole of the right kidney is incompletely characterized on today's non contrast CT examination, but is statistically likely to represent a mildly proteinaceous cyst.  Normal appendix. Trace volume of ascites. No pneumoperitoneum. No pathologic distention of small bowel. 10 mm short axis inguinal lymph nodes bilaterally are nonspecific. No other definite lymphadenopathy identified in the abdomen or pelvis on today's non contrast CT examination. Mild diffuse mesenteric edema. Prostate gland is enlarged measuring 4.9 x 6.1 cm. Urinary bladder is generally unremarkable in appearance, however, the attenuation values in the urine are rather high ranging from 45-55 HU, suggesting proteinaceous or hemorrhagic contents.  Musculoskeletal: Mild diffuse body wall edema. There are no aggressive appearing lytic or blastic lesions noted in the visualized portions of the skeleton.  IMPRESSION: 1. No definite source for bacteremia identified on today's examination. 2. Relatively high attenuation of the urine is of uncertain etiology and significance, but may suggest some proteinaceous or hemorrhagic contents. 3. Findings suggestive of anasarca, including moderate to large bilateral pleural effusions, trace volume of ascites, mild diffuse mesenteric edema and diffuse body wall edema. 4. Prostatomegaly. 5. Additional incidental findings, as above.   Electronically Signed   By: Vinnie Langton  M.D.   On: 07/09/2014 12:46    Review of Systems  All other systems reviewed and are negative. The patient currently denies nausea, vomiting, fever, chest pain, orthopnea, dizziness, PND, cough, congestion, abdominal pain, hematochezia, melena, claudication.  Blood pressure 142/71, pulse 67, temperature 97.7 F (36.5 C), temperature source Oral, resp. rate  18, height _0  (1.803 m), weight 193 lb 2 oz (87.6 kg), SpO2 98.00%. Physical Exam  Nursing note and vitals reviewed. Constitutional: He is oriented to person, place, and time. He appears well-developed and well-nourished. No distress.  HENT:  Head: Normocephalic and atraumatic.  Mouth/Throat: No oropharyngeal exudate.  Eyes: EOM are normal. Pupils are equal, round, and reactive to light. No scleral icterus.  Neck: Normal range of motion. Neck supple.  Cardiovascular: Normal rate, regular rhythm, S1 normal and S2 normal.   Murmur heard.  Systolic murmur is present with a grade of 2/6  Pulses:      Radial pulses are 2+ on the right side, and 2+ on the left side.       Dorsalis pedis pulses are 2+ on the right side, and 2+ on the left side.  Respiratory: Effort normal. He has no wheezes. He has no rales.  Decreased BS in the bases.   GI: Soft. Bowel sounds are normal. He exhibits no distension. There is no tenderness.  Musculoskeletal: He exhibits no edema.  Lymphadenopathy:    He has no cervical adenopathy.  Neurological: He is alert and oriented to person, place, and time. He exhibits normal muscle tone.  Skin: Skin is warm and dry.  Psychiatric: He has a normal mood and affect.    Assessment/Plan:  Principal Problem:   Enterococcal bacteremia Active Problems:   CAD (coronary artery disease)   Anemia   Thrombocytopenia   Pacemaker-Medtronic   Mitral regurgitation, Mod to severe   Sepsis   Acute on chronic diastolic CHF  Plan Concern for endocarditis.  There is question of worsening MR on the new echo.  ID  following.  He is on vancomycin.  He is back on lasix and starting to diuress with net fluids yesterday of -1.2L.  No Bacteremia source seen on the CT abd/pel but  moderate to large pleural effusions seen. BP and HR are stable.  He is SP one unit PRBCs on 8/4 and Hgb has improved from 8.6 to 10.8.  Platelets still very low.    A-sense V-pace on EKG.  Tarri Fuller, [place] 07/09/2014, 1:30 PM   History and all data above reviewed.  Patient examined.  I agree with the findings as above. The patient has had dyspnea.  He did get chills and fever over the weekend and has bacteremia as above.  ID is concerned about endocarditis.    The patient exam reveals COR:RRR, systolic murmur  ,  Lungs: Clear  ,  Abd: Positive bowel sounds, no rebound no guarding, Ext No edema  .  All available labs, radiology testing, previous records reviewed. Agree with documented assessment and plan. I did review the echo from two days ago and the June echo.  The moderately severe MR appears to be about the same and there is no obvious vegetation.  However, given the possibility of endocarditis and the need to quantify the degree of MR further with his dyspnea I will schedule a TEE.    Jeneen Rinks Wajiha Versteeg  3:24 PM  07/09/2014

## 2014-07-09 NOTE — Progress Notes (Signed)
Family stated that patient has been seeing things. Stated that he was seeing bugs flying around, and hair moving on his fingers. He was asking if they could see it also or if it was his eyes.

## 2014-07-09 NOTE — Progress Notes (Signed)
Inpatient Diabetes Program Recommendations  AACE/ADA: New Consensus Statement on Inpatient Glycemic Control (2013)  Target Ranges:  Prepandial:   less than 140 mg/dL      Peak postprandial:   less than 180 mg/dL (1-2 hours)      Critically ill patients:  140 - 180 mg/dL   Reason for Assessment:  Results for CHARLESTON, HANKIN (MRN [id number]) as of 07/09/2014 11:12  Ref. Range 07/08/2014 08:28 07/08/2014 11:55 07/08/2014 15:59 07/08/2014 21:05 07/09/2014 06:29  Glucose-Capillary Latest Range: 70-99 mg/dL 138 (H) 215 (H) 191 (H) 181 (H) 126 (H)   Diabetes history: No history of diabetes/Patient is on steroids Outpatient Diabetes medications: None Current orders for Inpatient glycemic control:  ICU Glycemic control protocol 1-2-3 q 3 hours.  Please discontinue ICU Glycemic Control and add Novolog moderate tid with meals (while patient is on steroids).  Thanks, Adah Perl, RN, BC-ADM Inpatient Diabetes Coordinator Pager [phone]

## 2014-07-10 DIAGNOSIS — Z950 Presence of cardiac pacemaker: Secondary | ICD-10-CM

## 2014-07-10 DIAGNOSIS — G459 Transient cerebral ischemic attack, unspecified: Secondary | ICD-10-CM | POA: Diagnosis present

## 2014-07-10 DIAGNOSIS — N289 Disorder of kidney and ureter, unspecified: Secondary | ICD-10-CM

## 2014-07-10 DIAGNOSIS — G458 Other transient cerebral ischemic attacks and related syndromes: Secondary | ICD-10-CM

## 2014-07-10 DIAGNOSIS — R609 Edema, unspecified: Secondary | ICD-10-CM

## 2014-07-10 LAB — COMPREHENSIVE METABOLIC PANEL
ALBUMIN: 2.1 g/dL — AB (ref 3.5–5.2)
ALT: 49 U/L (ref 0–53)
ANION GAP: 12 (ref 5–15)
AST: 54 U/L — AB (ref 0–37)
Alkaline Phosphatase: 64 U/L (ref 39–117)
BUN: 38 mg/dL — AB (ref 6–23)
CALCIUM: 8.2 mg/dL — AB (ref 8.4–10.5)
CO2: 23 mEq/L (ref 19–32)
CREATININE: 1.53 mg/dL — AB (ref 0.50–1.35)
Chloride: 104 mEq/L (ref 96–112)
GFR calc Af Amer: 48 mL/min — ABNORMAL LOW (ref >90)
GFR calc non Af Amer: 42 mL/min — ABNORMAL LOW (ref >90)
Glucose, Bld: 135 mg/dL — ABNORMAL HIGH (ref 70–99)
Potassium: 3.4 mEq/L — ABNORMAL LOW (ref 3.7–5.3)
Sodium: 139 mEq/L (ref 137–147)
Total Bilirubin: 1.1 mg/dL (ref 0.3–1.2)
Total Protein: 5.9 g/dL — ABNORMAL LOW (ref 6.0–8.3)

## 2014-07-10 LAB — CBC
HEMATOCRIT: 28.8 % — AB (ref 39.0–52.0)
Hemoglobin: 9.7 g/dL — ABNORMAL LOW (ref 13.0–17.0)
MCH: 31.6 pg (ref 26.0–34.0)
MCHC: 33.7 g/dL (ref 30.0–36.0)
MCV: 93.8 fL (ref 78.0–100.0)
Platelets: 6 10*3/uL — CL (ref 150–400)
RBC: 3.07 MIL/uL — ABNORMAL LOW (ref 4.22–5.81)
RDW: 17.4 % — AB (ref 11.5–15.5)
WBC: 10.6 10*3/uL — ABNORMAL HIGH (ref 4.0–10.5)

## 2014-07-10 LAB — TSH: TSH: 2.45 u[IU]/mL (ref 0.350–4.500)

## 2014-07-10 LAB — GLUCOSE, CAPILLARY
GLUCOSE-CAPILLARY: 180 mg/dL — AB (ref 70–99)
Glucose-Capillary: 132 mg/dL — ABNORMAL HIGH (ref 70–99)
Glucose-Capillary: 301 mg/dL — ABNORMAL HIGH (ref 70–99)
Glucose-Capillary: 174 mg/dL — ABNORMAL HIGH (ref 70–99)

## 2014-07-10 LAB — VITAMIN B12: Vitamin B-12: >2000 pg/mL — ABNORMAL HIGH (ref 211–911)

## 2014-07-10 MED ORDER — INSULIN ASPART 100 UNIT/ML ~~LOC~~ SOLN
0.0000 [IU] | Freq: Three times a day (TID) | SUBCUTANEOUS | Status: DC
  Filled 2014-07-10: qty 0.09

## 2014-07-10 MED ORDER — FUROSEMIDE 80 MG PO TABS
80.0000 mg | ORAL_TABLET | Freq: Every day | ORAL | Status: DC
  Administered 2014-07-10 – 2014-07-16 (×6): 80 mg via ORAL
  Filled 2014-07-10 (×7): qty 1

## 2014-07-10 MED ORDER — INSULIN ASPART 100 UNIT/ML ~~LOC~~ SOLN
0.0000 [IU] | Freq: Three times a day (TID) | SUBCUTANEOUS | Status: DC
  Administered 2014-07-10: 7 [IU] via SUBCUTANEOUS
  Administered 2014-07-11: 2 [IU] via SUBCUTANEOUS
  Administered 2014-07-11: 1 [IU] via SUBCUTANEOUS
  Administered 2014-07-11 – 2014-07-13 (×4): 3 [IU] via SUBCUTANEOUS
  Administered 2014-07-13: 9 [IU] via SUBCUTANEOUS
  Administered 2014-07-14: 1 [IU] via SUBCUTANEOUS

## 2014-07-10 MED ORDER — POTASSIUM CHLORIDE CRYS ER 20 MEQ PO TBCR
40.0000 meq | EXTENDED_RELEASE_TABLET | Freq: Once | ORAL | Status: AC
  Administered 2014-07-10: 40 meq via ORAL
  Filled 2014-07-10: qty 2

## 2014-07-10 MED ORDER — ATORVASTATIN CALCIUM 10 MG PO TABS
10.0000 mg | ORAL_TABLET | Freq: Every evening | ORAL | Status: DC
  Administered 2014-07-10 – 2014-07-23 (×13): 10 mg via ORAL
  Filled 2014-07-10 (×16): qty 1

## 2014-07-10 MED ORDER — PREDNISONE 50 MG PO TABS
80.0000 mg | ORAL_TABLET | Freq: Every day | ORAL | Status: DC
  Administered 2014-07-10 – 2014-07-14 (×5): 80 mg via ORAL
  Filled 2014-07-10 (×8): qty 1

## 2014-07-10 NOTE — Progress Notes (Addendum)
[facility] PROGRESS NOTE  Justin [name] YWV:[id number] DOB: 13-Jul-1933 DOA: 07/07/2014 PCP: Penni Homans, MD  Assessment/Plan: 80 yo male with diastolic heart failure currently treated for ITP with systemic steroids and on IVIG presents to [facility] 8/3 with dyspnea and fever. Hypotensive requiring vasopressors.  Enterococcus Bacteremia: On IV vancomycin day 4. Blood Culture 8-5 no growth to date. Needs to follow blood culture result to place PICC line. Cardiology consulted for review ECHO , evaluation for endocarditis. WBC trending down. CT abdomen  no mas. Will order PICC line 8-8 if blood cultures continue to be negative.   Transient t Hallucination 8-6: related to medications, infections, delirium. CT head was negative.   Transient hand weakness, numbness. : neuro exam non focal. Episode resolved. Replete K. Neurology evaluation. Multiple risk factors for TIA, Stroke. Difficult situation with thrombocytopenia, high risk for bleeding.   Possible pneumonia vs TRALI from IVIG  Bilateral effusions - stable Continue with Lasix. Monitor renal function.   Diarrhea; check c diff.   Elevated troponin: in setting sepsis. ECHO; normal Wall motion. Cardiology to following.   Enterobacter Urine: 30,000 colonies.   Chronic diastolic heart failure - on lasix at home Restart lasix (80 mg ) on 8-5.  Urine out put 1 L on 8-6.  Dyspnea improving.   Sepsis: enterococcus bacteremia. Received pressors (Levophed). BP stable. Continue with Vancomycin.   AKI:in setting of infection. Monitor on Lasix.   Thrombocytopenia, ITP, first IVIG treatment 8/3, on chronic prednisone. Platelet at 6. Dr Marin Olp following. Continue with prednisone 80 mg daily.   Suspected Adrenal insufficiency ( chronic steroids )  Transition to chronic dose of prednisone.   H/o PAF, now in SR   Code Status: Partial: yes to antiarrythmic and vasopressors.  Family Communication: care discussed with Daughter Disposition  Plan: home when stable.    Consultants:  ID  Cardiology.   Procedures: 6/24 TTE >>> LVEF 55%, Grade 2 DD  8/3 CT chest >>> bilateral effusions, bilateral GGO, several nodular opacities  8/4 ECHO >>> Mild to mod MR, EF 50-55%, G2 Dia Dys Fcn   Antibiotics:  Vancomycin 8-3  HPI/Subjective: Relates episode of right hand grip weakness, numbness lasted for few seconds.  Yesterday had an episode of hallucination, seeing bugs.   Objective: Filed Vitals:   07/10/14 1431  BP: 135/48  Pulse: 69  Temp: 97.8 F (36.6 C)  Resp: 20    Intake/Output Summary (Last 24 hours) at 07/10/14 1510 Last data filed at 07/10/14 1326  Gross per 24 hour  Intake   1110 ml  Output   1325 ml  Net   -215 ml   Filed Weights   07/08/14 2139 07/09/14 0541 07/10/14 0547  Weight: 87.59 kg (193 lb 1.6 oz) 87.6 kg (193 lb 2 oz) 86.902 kg (191 lb 9.4 oz)    Exam:   General:  No distress.   Cardiovascular: S 1, S 2 IRR. JVD.   Respiratory: decrease breath sound bases, B/L crackles.   Abdomen: Bs present, soft, NT  Musculoskeletal: plus 2 edema.   Neuro exam ; non focal.   Data Reviewed: Basic Metabolic Panel:  Recent Labs Lab 07/11/2014 1530 07/04/2014 2315 07/07/14 0235 07/08/14 0235 07/09/14 0945 07/10/14 0410  NA 136*  --  133* 135* 139 139  K 4.6  --  4.4 3.8 3.8 3.4*  CL 99  --  101 104 106 104  CO2 24  --  18* 18* 22 23  GLUCOSE 130*  --  147*  127* 141* 135*  BUN 45*  --  48* 46* 43* 38*  CREATININE 1.50*  --  1.57* 1.55* 1.57* 1.53*  CALCIUM 8.9  --  7.4* 7.8* 8.4 8.2*  MG  --  2.0  --   --   --   --    Liver Function Tests:  Recent Labs Lab 07/31/2014 1530 07/09/14 0945 07/10/14 0410  AST 77* 64* 54*  ALT 39 53 49  ALKPHOS 106 75 64  BILITOT 1.7* 1.4* 1.1  PROT 7.8 6.5 5.9*  ALBUMIN 2.6* 2.1* 2.1*   No results found for this basename: LIPASE, AMYLASE,  in the last 168 hours No results found for this basename: AMMONIA,  in the last 168 hours CBC:  Recent  Labs Lab 07/17/2014 0833 07/18/2014 1530 07/07/14 0235 07/08/14 0235 07/09/14 0945 07/10/14 0410  WBC 14.0* 13.4* 24.8* 19.7* 16.7* 10.6*  NEUTROABS 12.5* 12.4*  --  18.0*  --   --   HGB 9.5* 9.8* 8.6* 9.6* 10.8* 9.7*  HCT 28.8* 30.4* 26.1* 28.3* 32.1* 28.8*  MCV 97 96.5 94.9 93.1 93.6 93.8  PLT 10* 8* 11* 8* 10* 6*   Cardiac Enzymes:  Recent Labs Lab 07/21/2014 1530 07/20/2014 2140  TROPONINI 0.68* 1.59*   BNP (last 3 results)  Recent Labs  08/01/2014 1530  PROBNP 12303.0*   CBG:  Recent Labs Lab 07/09/14 1122 07/09/14 1704 07/09/14 2119 07/10/14 0552 07/10/14 1057  GLUCAP 144* 147* 158* 132* 180*    Recent Results (from the past 240 hour(s))  CULTURE, BLOOD (ROUTINE X 2)     Status: None   Collection Time    07/20/2014  3:30 PM      Result Value Ref Range Status   Specimen Description BLOOD LEFT ARM   Final   Special Requests BOTTLES DRAWN AEROBIC AND ANAEROBIC [facility]   Final   Culture  Setup Time     Final   Value: 07/24/2014 19:23     Performed at [facility]   Culture     Final   Value: ENTEROCOCCUS SPECIES     Note: SUSCEPTIBILITIES PERFORMED ON PREVIOUS CULTURE WITHIN THE LAST 5 DAYS.     Note: Gram Stain Report Called to,Read Back By and Verified With: JAMES ARTIS 07/07/14 AT 0730 RIDK     Performed at [facility]   Report Status 07/09/2014 FINAL   Final  URINE CULTURE     Status: None   Collection Time    07/08/2014  4:18 PM      Result Value Ref Range Status   Specimen Description URINE, CLEAN CATCH   Final   Special Requests NONE   Final   Culture  Setup Time     Final   Value: 07/07/2014 02:46     Performed at [place]     Final   Value: 35,000 COLONIES/ML     Performed at [facility]   Culture     Final   Value: ENTEROBACTER CLOACAE     Performed at [facility]   Report Status 07/09/2014 FINAL   Final   Organism ID, Bacteria ENTEROBACTER CLOACAE   Final  CULTURE, BLOOD (ROUTINE X 2)      Status: None   Collection Time    07/19/2014  4:30 PM      Result Value Ref Range Status   Specimen Description BLOOD R ARM   Final   Special Requests BOTTLES DRAWN AEROBIC AND ANAEROBIC  [place]   Final   Culture  Setup Time     Final   Value: 08/02/2014 19:22     Performed at [facility]   Culture     Final   Value: ENTEROCOCCUS SPECIES     Note: Gram Stain Report Called to,Read Back By and Verified With: JAMES ARTIS 07/07/14 AT 0730 RIDK     Performed at [facility]   Report Status 07/09/2014 FINAL   Final   Organism ID, Bacteria ENTEROCOCCUS SPECIES   Final  MRSA PCR SCREENING     Status: None   Collection Time    07/24/2014  7:52 PM      Result Value Ref Range Status   MRSA by PCR NEGATIVE  NEGATIVE Final   Comment:            The GeneXpert MRSA Assay (FDA     approved for NASAL specimens     only), is one component of a     comprehensive MRSA colonization     surveillance program. It is not     intended to diagnose MRSA     infection nor to guide or     monitor treatment for     MRSA infections.  CULTURE, BLOOD (ROUTINE X 2)     Status: None   Collection Time    07/08/14 12:00 PM      Result Value Ref Range Status   Specimen Description BLOOD RIGHT HAND   Final   Special Requests BOTTLES DRAWN AEROBIC ONLY [facility]   Final   Culture  Setup Time     Final   Value: 07/08/2014 17:14     Performed at [facility]   Culture     Final   Value:        BLOOD CULTURE RECEIVED NO GROWTH TO DATE CULTURE WILL BE HELD FOR 5 DAYS BEFORE ISSUING A FINAL NEGATIVE REPORT     Performed at [facility]   Report Status PENDING   Incomplete  CULTURE, BLOOD (ROUTINE X 2)     Status: None   Collection Time    07/08/14 12:10 PM      Result Value Ref Range Status   Specimen Description BLOOD LEFT HAND   Final   Special Requests BOTTLES DRAWN AEROBIC ONLY [facility]   Final   Culture  Setup Time     Final   Value: 07/08/2014 17:05     Performed at [facility]    Culture     Final   Value:        BLOOD CULTURE RECEIVED NO GROWTH TO DATE CULTURE WILL BE HELD FOR 5 DAYS BEFORE ISSUING A FINAL NEGATIVE REPORT     Performed at [facility]   Report Status PENDING   Incomplete     Studies: Ct Abdomen Pelvis Wo Contrast  07/09/2014   CLINICAL DATA:  Enterococcus bacteremia. Evaluate for abdominal source. Evaluate for potential tumor. Enterobacter is also present in the urine.  EXAM: CT ABDOMEN AND PELVIS WITHOUT CONTRAST  TECHNIQUE: Multidetector CT imaging of the abdomen and pelvis was performed following the standard protocol without IV contrast.  COMPARISON:  No priors.  FINDINGS: Lung Bases: Atherosclerotic calcifications in the right coronary artery. Pacemaker lead terminating in the right ventricular apex. Moderate-sized hiatal hernia. Moderate to large bilateral pleural effusions which appear to layer dependently and are associated with extensive passive atelectasis in the visualized portions of the lower lobes of the  lungs bilaterally.  Abdomen/Pelvis: 9 mm low attenuation lesion in segment 2 of the liver is incompletely characterized on today's noncontrast CT examination, but is unchanged, and favored to represent a tiny cyst. No other focal hepatic lesions are identified on today's non contrast CT examination. There is a tiny calcifications in the head of the pancreas (image 31 of series 2) which appears to be immediately posterior to the distal common bile duct, potentially related to prior episodes of pancreatitis. Other small calcifications are also noted in the head and tail of the pancreas as well. The calcification in the head of the pancreas is not favored to be within the common bile duct, as the common bile duct is normal in caliber measuring 4 mm, and there is no intrahepatic biliary ductal dilatation to suggest obstruction. The unenhanced appearance of the gallbladder, spleen and bilateral adrenal glands is unremarkable. There is bilateral  perinephric stranding which is nonspecific. 1.5 cm intermediate attenuation lesion extending exophytically off the lateral aspect of the upper pole of the right kidney is incompletely characterized on today's non contrast CT examination, but is statistically likely to represent a mildly proteinaceous cyst.  Normal appendix. Trace volume of ascites. No pneumoperitoneum. No pathologic distention of small bowel. 10 mm short axis inguinal lymph nodes bilaterally are nonspecific. No other definite lymphadenopathy identified in the abdomen or pelvis on today's non contrast CT examination. Mild diffuse mesenteric edema. Prostate gland is enlarged measuring 4.9 x 6.1 cm. Urinary bladder is generally unremarkable in appearance, however, the attenuation values in the urine are rather high ranging from 45-55 HU, suggesting proteinaceous or hemorrhagic contents.  Musculoskeletal: Mild diffuse body wall edema. There are no aggressive appearing lytic or blastic lesions noted in the visualized portions of the skeleton.  IMPRESSION: 1. No definite source for bacteremia identified on today's examination. 2. Relatively high attenuation of the urine is of uncertain etiology and significance, but may suggest some proteinaceous or hemorrhagic contents. 3. Findings suggestive of anasarca, including moderate to large bilateral pleural effusions, trace volume of ascites, mild diffuse mesenteric edema and diffuse body wall edema. 4. Prostatomegaly. 5. Additional incidental findings, as above.   Electronically Signed   By: Vinnie Langton M.D.   On: 07/09/2014 12:46   Ct Head Wo Contrast  07/09/2014   CLINICAL DATA:  Hallucinations.  Thrombocytopenia  EXAM: CT HEAD WITHOUT CONTRAST  TECHNIQUE: Contiguous axial images were obtained from the base of the skull through the vertex without intravenous contrast.  COMPARISON:  None.  FINDINGS: Mild atrophy. Negative for hemorrhage. Negative for acute infarct or mass. Calvarium intact.   IMPRESSION: No acute abnormality.   Electronically Signed   By: Franchot Gallo M.D.   On: 07/09/2014 18:47    Scheduled Meds: . sodium chloride   Intravenous Once  . antiseptic oral rinse  7 mL Mouth Rinse BID  . atorvastatin  10 mg Oral QPM  . furosemide  80 mg Oral Daily  . insulin aspart  1-3 Units Subcutaneous TID WC & HS  . pantoprazole  40 mg Oral Daily  . predniSONE  80 mg Oral Q breakfast  . pyridOXINE  100 mg Oral QPM  . tamsulosin  0.4 mg Oral QPC supper  . vancomycin  750 mg Intravenous Q12H  . vitamin B-12  1,000 mcg Oral QPM  . vitamin E  400 Units Oral Daily   Continuous Infusions:    Principal Problem:   Enterococcal bacteremia Active Problems:   CAD (coronary artery disease)  Anemia   Thrombocytopenia   Pacemaker-[employer]   Mitral regurgitation   Sepsis    Time spent: 35 minutes.     Niel Hummer A  [facility] Pager [phone]. If 7PM-7AM, please contact night-coverage at [email or web address], password [facility] 07/10/2014, 3:10 PM  LOS: 4 days

## 2014-07-10 NOTE — Consult Note (Signed)
Referring Physician: Regalado    Chief Complaint: TIA  HPI:                                                                                                                                         Justin [name] is an 80 y.o. male who was initially brought to hospital for dyspnea.  He is currently being followed by Dr. Marin Olp for ITP and treated with systemic steroids and was treated with IVIG. While in hospital he was noted to have both enterococcus and Enterobacter in his urine and blood.  ID has been following. No obvious source of bacteremia has been found. This AM he went to pick up the top to the dish and noted he had a hard time grasping and feeling the lid with his right hand. He states "this only lasted for a few seconds" then resolved. "If he was not in the hospital he probably would have not thought anything about it".  Currently he is back to his baseline and feels fine. Cardiology was consulted for possible worsening of MR.  They have reviewed the echo and feels it has not worsened.  Patient was to get a TEE for possibility  Of endocarditis but due to PLT of 6 this is going to be postponed.   Note --Patient has pacer and cannot obtain MRI  Recent:  BUN/Cr 38/1.53 WBC 10.6 PLT 6--received Nplate 3 days ago.  Oncology is involved.    2 D echo Impressions:  - Mildly dilated left ventricle with normal systolic function and grade 2 diastolic dysfunction. Moderate aortic insufficiency, moderate to severe mitral and moderate tricusid regurgitation. Mildly reduced RV systolic function. Severe pulmonary hypertension.    Date last known well: Date: 07/10/2014 Time last known well: Time: 08:00 tPA Given: No: symptoms resolved  Past Medical History  Diagnosis Date  . CAD (coronary artery disease) prior stenting 1999   . Dyslipidemia   . HTN (hypertension)   . WPW (Wolff-Parkinson-White syndrome) loss of preexcitation   . Atrial fibrillation   . AV block, Mobitz II   .  Presyncope   . Myocardial infarction   . Pacemaker 04/07/2013  . Arthritis   . History of chicken pox   . Anemia   . Hypernatremia 06/03/2013  . Thrombocytopenia, unspecified 06/03/2013  . Leukopenia 06/03/2013  . Obstructive sleep apnea 06/03/2013    USES CPAP  . Urinary incontinence 06/03/2013  . Hyperglycemia 12/21/2013  . Pancytopenia 06/03/2013  . Iron deficiency anemia, unspecified 07/02/2014  . Malabsorption of iron 07/02/2014  . Hypotestosteronemia 07/02/2014  . ITP (idiopathic thrombocytopenic purpura) 07/02/2014    Past Surgical History  Procedure Laterality Date  . Coronary angioplasty with stent placement    . Vastectomy    . Insert / replace / remove pacemaker  04/07/2013    Family History  Problem Relation Age of Onset  . Stroke Mother   .  Hypertension Mother   . Stroke Sister   . Arthritis Sister     rheumatoid  . Heart attack Sister   . Anemia Sister   . Other Brother     tube put in aorta  . Anemia Brother   . Other Son     cortisone deficiency  . Arthritis Son   . Stroke Brother   . Alcohol abuse Brother   . Barrett's esophagus Son   . Colon cancer Maternal Grandmother   . Colon cancer Maternal Grandfather    Social History:  reports that he quit smoking about 22 years ago. His smoking use included Cigarettes. He started smoking about 59 years ago. He has a 36 pack-year smoking history. He has never used smokeless tobacco. He reports that he drinks alcohol. He reports that he does not use illicit drugs.  Allergies: No Known Allergies  Medications:                                                                                                                           Prior to Admission:  Prescriptions prior to admission  Medication Sig Dispense Refill  . atorvastatin (LIPITOR) 10 MG tablet Take 1 tablet (10 mg total) by mouth every evening.  90 tablet  3  . Azelaic Acid (FINACEA) 15 % cream Apply 1 application topically daily. After skin is thoroughly washed  and patted dry, gently but thoroughly massage a thin film of azelaic acid creame      . Cholecalciferol (VITAMIN D-3) 1000 UNITS CAPS Take 1 capsule by mouth daily.      . furosemide (LASIX) 20 MG tablet Take 3 tablets (60 mg total) by mouth daily.  90 tablet  12  . losartan (COZAAR) 50 MG tablet Take 1 tablet (50 mg total) by mouth daily.  90 tablet  3  . Multiple Vitamin (MULTIVITAMIN WITH MINERALS) TABS Take 1 tablet by mouth 2 (two) times a week.       . nadolol (CORGARD) 20 MG tablet Take 10 mg by mouth daily.      . nitroGLYCERIN (NITROSTAT) 0.4 MG SL tablet Place 1 tablet (0.4 mg total) under the tongue every 5 (five) minutes as needed for chest pain. x3 doses as needed for chest pain  25 tablet  12  . omeprazole (PRILOSEC) 20 MG capsule Take 20 mg by mouth daily as needed (for heartburn).       Vladimir Faster Glycol-Propyl Glycol (SYSTANE ULTRA OP) Apply 1 drop to eye 4 (four) times daily as needed (for dry eyes).      . predniSONE (DELTASONE) 20 MG tablet Take 80 mg by mouth daily with breakfast.      . Probiotic Product (PROBIOTIC DAILY PO) Take 1 capsule by mouth daily.       Marland Kitchen pyridOXINE (VITAMIN B-6) 100 MG tablet Take 50 mg by mouth every evening.       . tamsulosin (FLOMAX) 0.4 MG CAPS capsule Take 0.4 mg  by mouth at bedtime.      . vitamin B-12 (CYANOCOBALAMIN) 1000 MCG tablet Take 1,000 mcg by mouth every evening.      . Vitamin E (VITA-PLUS E PO) Take 1 capsule by mouth daily.       Scheduled: . sodium chloride   Intravenous Once  . antiseptic oral rinse  7 mL Mouth Rinse BID  . atorvastatin  10 mg Oral QPM  . furosemide  80 mg Oral Daily  . insulin aspart  1-3 Units Subcutaneous TID WC & HS  . pantoprazole  40 mg Oral Daily  . predniSONE  80 mg Oral Q breakfast  . pyridOXINE  100 mg Oral QPM  . tamsulosin  0.4 mg Oral QPC supper  . vancomycin  750 mg Intravenous Q12H  . vitamin B-12  1,000 mcg Oral QPM  . vitamin E  400 Units Oral Daily    ROS:                                                                                                                                        History obtained from the patient  General ROS: negative for - chills, fatigue, fever, night sweats, weight gain or weight loss Psychological ROS: negative for - behavioral disorder, hallucinations, memory difficulties, mood swings or suicidal ideation Ophthalmic ROS: negative for - blurry vision, double vision, eye pain or loss of vision ENT ROS: negative for - epistaxis, nasal discharge, oral lesions, sore throat, tinnitus or vertigo Allergy and Immunology ROS: negative for - hives or itchy/watery eyes Hematological and Lymphatic ROS: negative for - bleeding problems, bruising or swollen lymph nodes Endocrine ROS: negative for - galactorrhea, hair pattern changes, polydipsia/polyuria or temperature intolerance Respiratory ROS: negative for - cough, hemoptysis, shortness of breath or wheezing Cardiovascular ROS: negative for - chest pain, dyspnea on exertion, edema or irregular heartbeat Gastrointestinal ROS: negative for - abdominal pain, diarrhea, hematemesis, nausea/vomiting or stool incontinence Genito-Urinary ROS: negative for - dysuria, hematuria, incontinence or urinary frequency/urgency Musculoskeletal ROS: negative for - joint swelling or muscular weakness Neurological ROS: as noted in HPI Dermatological ROS: negative for rash and skin lesion changes  Neurologic Examination:                                                                                                      Blood pressure 137/47, pulse 71, temperature 97.5 F (36.4 C), temperature source Oral, resp. rate 18, height 5\' 11"  (1.803 m), weight 86.902 kg (191  lb 9.4 oz), SpO2 94.00%.   General: NAD Mental Status: Alert, oriented, thought content appropriate.  Speech fluent without evidence of aphasia.  Able to follow 3 step commands without difficulty. Cranial Nerves: II: Discs flat bilaterally; Visual fields  grossly normal, pupils equal, round, reactive to light and accommodation III,IV, VI: ptosis not present, extra-ocular motions intact bilaterally V,VII: smile symmetric, facial light touch sensation normal bilaterally VIII: hearing normal bilaterally IX,X: gag reflex present XI: bilateral shoulder shrug XII: midline tongue extension without atrophy or fasciculations  Motor: Right : Upper extremity   5/5    Left:     Upper extremity   5/5  Lower extremity   5/5     Lower extremity   5/5 Tone and bulk:normal tone throughout; no atrophy noted Sensory: Pinprick and light touch intact throughout, bilaterally Deep Tendon Reflexes:  Right: Upper Extremity   Left: Upper extremity   biceps (C-5 to C-6) 2/4   biceps (C-5 to C-6) 2/4 tricep (C7) 2/4    triceps (C7) 2/4 Brachioradialis (C6) 2/4  Brachioradialis (C6) 2/4  Lower Extremity Lower Extremity  quadriceps (L-2 to L-4) 2/4   quadriceps (L-2 to L-4) 2/4 Achilles (S1) 1/4   Achilles (S1) 1/4  Plantars: Right: downgoing   Left: downgoing Cerebellar: normal finger-to-nose,  normal heel-to-shin test Gait: not tested.  CV: pulses palpable throughout    Lab Results: Basic Metabolic Panel:  Recent Labs Lab 07/20/2014 1530 07/24/2014 2315 07/07/14 0235 07/08/14 0235 07/09/14 0945 07/10/14 0410  NA 136*  --  133* 135* 139 139  K 4.6  --  4.4 3.8 3.8 3.4*  CL 99  --  101 104 106 104  CO2 24  --  18* 18* 22 23  GLUCOSE 130*  --  147* 127* 141* 135*  BUN 45*  --  48* 46* 43* 38*  CREATININE 1.50*  --  1.57* 1.55* 1.57* 1.53*  CALCIUM 8.9  --  7.4* 7.8* 8.4 8.2*  MG  --  2.0  --   --   --   --     Liver Function Tests:  Recent Labs Lab 07/16/2014 1530 07/09/14 0945 07/10/14 0410  AST 77* 64* 54*  ALT 39 53 49  ALKPHOS 106 75 64  BILITOT 1.7* 1.4* 1.1  PROT 7.8 6.5 5.9*  ALBUMIN 2.6* 2.1* 2.1*   No results found for this basename: LIPASE, AMYLASE,  in the last 168 hours No results found for this basename: AMMONIA,  in the  last 168 hours  CBC:  Recent Labs Lab 08/03/2014 0833 07/07/2014 1530 07/07/14 0235 07/08/14 0235 07/09/14 0945 07/10/14 0410  WBC 14.0* 13.4* 24.8* 19.7* 16.7* 10.6*  NEUTROABS 12.5* 12.4*  --  18.0*  --   --   HGB 9.5* 9.8* 8.6* 9.6* 10.8* 9.7*  HCT 28.8* 30.4* 26.1* 28.3* 32.1* 28.8*  MCV 97 96.5 94.9 93.1 93.6 93.8  PLT 10* 8* 11* 8* 10* 6*    Cardiac Enzymes:  Recent Labs Lab 07/05/2014 1530 07/22/2014 2140  TROPONINI 0.68* 1.59*    Lipid Panel: No results found for this basename: CHOL, TRIG, HDL, CHOLHDL, VLDL, LDLCALC,  in the last 168 hours  CBG:  Recent Labs Lab 07/09/14 1122 07/09/14 1704 07/09/14 2119 07/10/14 0552 07/10/14 1057  GLUCAP 144* 147* 158* 132* 180*    Microbiology: Results for orders placed during the hospital encounter of 07/07/2014  CULTURE, BLOOD (ROUTINE X 2)     Status: None   Collection Time    07/09/2014  3:30 PM      Result Value Ref Range Status   Specimen Description BLOOD LEFT ARM   Final   Special Requests BOTTLES DRAWN AEROBIC AND ANAEROBIC [facility]   Final   Culture  Setup Time     Final   Value: 07/12/2014 19:23     Performed at [facility]   Culture     Final   Value: ENTEROCOCCUS SPECIES     Note: SUSCEPTIBILITIES PERFORMED ON PREVIOUS CULTURE WITHIN THE LAST 5 DAYS.     Note: Gram Stain Report Called to,Read Back By and Verified With: JAMES ARTIS 07/07/14 AT 0730 RIDK     Performed at [facility]   Report Status 07/09/2014 FINAL   Final  URINE CULTURE     Status: None   Collection Time    07/08/2014  4:18 PM      Result Value Ref Range Status   Specimen Description URINE, CLEAN CATCH   Final   Special Requests NONE   Final   Culture  Setup Time     Final   Value: 07/07/2014 02:46     Performed at [facility]     Final   Value: 35,000 COLONIES/ML     Performed at [facility]   Culture     Final   Value: ENTEROBACTER CLOACAE     Performed at [facility]   Report  Status 07/09/2014 FINAL   Final   Organism ID, Bacteria ENTEROBACTER CLOACAE   Final  CULTURE, BLOOD (ROUTINE X 2)     Status: None   Collection Time    07/25/2014  4:30 PM      Result Value Ref Range Status   Specimen Description BLOOD R ARM   Final   Special Requests BOTTLES DRAWN AEROBIC AND ANAEROBIC [place]   Final   Culture  Setup Time     Final   Value: 08/01/2014 19:22     Performed at [facility]   Culture     Final   Value: ENTEROCOCCUS SPECIES     Note: Gram Stain Report Called to,Read Back By and Verified With: JAMES ARTIS 07/07/14 AT 0730 RIDK     Performed at [facility]   Report Status 07/09/2014 FINAL   Final   Organism ID, Bacteria ENTEROCOCCUS SPECIES   Final  MRSA PCR SCREENING     Status: None   Collection Time    08/02/2014  7:52 PM      Result Value Ref Range Status   MRSA by PCR NEGATIVE  NEGATIVE Final   Comment:            The GeneXpert MRSA Assay (FDA     approved for NASAL specimens     only), is one component of a     comprehensive MRSA colonization     surveillance program. It is not     intended to diagnose MRSA     infection nor to guide or     monitor treatment for     MRSA infections.  CULTURE, BLOOD (ROUTINE X 2)     Status: None   Collection Time    07/08/14 12:00 PM      Result Value Ref Range Status   Specimen Description BLOOD RIGHT HAND   Final   Special Requests BOTTLES DRAWN AEROBIC ONLY [facility]   Final   Culture  Setup Time     Final   Value:  07/08/2014 17:14     Performed at [facility]   Culture     Final   Value:        BLOOD CULTURE RECEIVED NO GROWTH TO DATE CULTURE WILL BE HELD FOR 5 DAYS BEFORE ISSUING A FINAL NEGATIVE REPORT     Performed at [facility]   Report Status PENDING   Incomplete  CULTURE, BLOOD (ROUTINE X 2)     Status: None   Collection Time    07/08/14 12:10 PM      Result Value Ref Range Status   Specimen Description BLOOD LEFT HAND   Final   Special Requests BOTTLES DRAWN  AEROBIC ONLY [facility]   Final   Culture  Setup Time     Final   Value: 07/08/2014 17:05     Performed at [facility]   Culture     Final   Value:        BLOOD CULTURE RECEIVED NO GROWTH TO DATE CULTURE WILL BE HELD FOR 5 DAYS BEFORE ISSUING A FINAL NEGATIVE REPORT     Performed at [facility]   Report Status PENDING   Incomplete    Coagulation Studies: No results found for this basename: LABPROT, INR,  in the last 72 hours  Imaging: Ct Abdomen Pelvis Wo Contrast  07/09/2014   CLINICAL DATA:  Enterococcus bacteremia. Evaluate for abdominal source. Evaluate for potential tumor. Enterobacter is also present in the urine.  EXAM: CT ABDOMEN AND PELVIS WITHOUT CONTRAST  TECHNIQUE: Multidetector CT imaging of the abdomen and pelvis was performed following the standard protocol without IV contrast.  COMPARISON:  No priors.  FINDINGS: Lung Bases: Atherosclerotic calcifications in the right coronary artery. Pacemaker lead terminating in the right ventricular apex. Moderate-sized hiatal hernia. Moderate to large bilateral pleural effusions which appear to layer dependently and are associated with extensive passive atelectasis in the visualized portions of the lower lobes of the lungs bilaterally.  Abdomen/Pelvis: 9 mm low attenuation lesion in segment 2 of the liver is incompletely characterized on today's noncontrast CT examination, but is unchanged, and favored to represent a tiny cyst. No other focal hepatic lesions are identified on today's non contrast CT examination. There is a tiny calcifications in the head of the pancreas (image 31 of series 2) which appears to be immediately posterior to the distal common bile duct, potentially related to prior episodes of pancreatitis. Other small calcifications are also noted in the head and tail of the pancreas as well. The calcification in the head of the pancreas is not favored to be within the common bile duct, as the common bile duct is normal in  caliber measuring 4 mm, and there is no intrahepatic biliary ductal dilatation to suggest obstruction. The unenhanced appearance of the gallbladder, spleen and bilateral adrenal glands is unremarkable. There is bilateral perinephric stranding which is nonspecific. 1.5 cm intermediate attenuation lesion extending exophytically off the lateral aspect of the upper pole of the right kidney is incompletely characterized on today's non contrast CT examination, but is statistically likely to represent a mildly proteinaceous cyst.  Normal appendix. Trace volume of ascites. No pneumoperitoneum. No pathologic distention of small bowel. 10 mm short axis inguinal lymph nodes bilaterally are nonspecific. No other definite lymphadenopathy identified in the abdomen or pelvis on today's non contrast CT examination. Mild diffuse mesenteric edema. Prostate gland is enlarged measuring 4.9 x 6.1 cm. Urinary bladder is generally unremarkable in appearance, however, the attenuation values in the urine are  rather high ranging from 45-55 HU, suggesting proteinaceous or hemorrhagic contents.  Musculoskeletal: Mild diffuse body wall edema. There are no aggressive appearing lytic or blastic lesions noted in the visualized portions of the skeleton.  IMPRESSION: 1. No definite source for bacteremia identified on today's examination. 2. Relatively high attenuation of the urine is of uncertain etiology and significance, but may suggest some proteinaceous or hemorrhagic contents. 3. Findings suggestive of anasarca, including moderate to large bilateral pleural effusions, trace volume of ascites, mild diffuse mesenteric edema and diffuse body wall edema. 4. Prostatomegaly. 5. Additional incidental findings, as above.   Electronically Signed   By: Vinnie Langton M.D.   On: 07/09/2014 12:46   Ct Head Wo Contrast  07/09/2014   CLINICAL DATA:  Hallucinations.  Thrombocytopenia  EXAM: CT HEAD WITHOUT CONTRAST  TECHNIQUE: Contiguous axial images were  obtained from the base of the skull through the vertex without intravenous contrast.  COMPARISON:  None.  FINDINGS: Mild atrophy. Negative for hemorrhage. Negative for acute infarct or mass. Calvarium intact.  IMPRESSION: No acute abnormality.   Electronically Signed   By: Franchot Gallo M.D.   On: 07/09/2014 18:47       Assessment and plan discussed with with attending physician and they are in agreement.    Etta Quill PA-C [facility] [phone]  07/10/2014, 1:02 PM   Assessment: 80 y.o. male currently being treated by oncology for ITP and ID for enterococcus and Enterobacter in his urine and blood. While hospitalized patient exhibited transient right hand decreased strength and sensation most consistent with TIA. ITP can be associated with stroke, though I am not sure if this diagnosis is definitive. Certainly with his bacteremia, the possibility of endocarditis would need to be considered. Echo was negative, however TEE cannot be performed given his thrombocytopenia. Also, he does have atrial fibrillation, but is not anticoagulated currently do to thrombocytopenia.   Stroke Risk Factors - atrial fibrillation, hyperlipidemia and hypertension  1) agree with treatment with antibiotics per infectious disease team 2) I agree with holding anticoagulation currently given severe thrombocytopenia 3) he will need anticoagulation over the long-term once his platelets begin to improve.  Roland Rack, MD [facility] [phone]  If 7pm- 7am, please page neurology on call as listed in [place].

## 2014-07-10 NOTE — Progress Notes (Signed)
Patient Name: Justin [name] Date of Encounter: 07/10/2014     Principal Problem:   Enterococcal bacteremia Active Problems:   CAD (coronary artery disease)   Anemia   Thrombocytopenia   Pacemaker-Medtronic   Mitral regurgitation   Sepsis    SUBJECTIVE  No CP or SOB. No fever or chill. Some delirium last night where he saw things that no one else can see. CT of brain negative last night. This morning he had 3 minutes of R hand numbness and coldness. Quickly resolved. Neuro involved.  CURRENT MEDS . sodium chloride   Intravenous Once  . antiseptic oral rinse  7 mL Mouth Rinse BID  . furosemide  80 mg Oral Daily  . insulin aspart  1-3 Units Subcutaneous TID WC & HS  . pantoprazole  40 mg Oral Daily  . predniSONE  80 mg Oral Q breakfast  . pyridOXINE  100 mg Oral QPM  . tamsulosin  0.4 mg Oral QPC supper  . vancomycin  750 mg Intravenous Q12H  . vitamin B-12  1,000 mcg Oral QPM  . vitamin E  400 Units Oral Daily    OBJECTIVE  Filed Vitals:   07/09/14 2113 07/10/14 0209 07/10/14 0547 07/10/14 1042  BP: 162/56 151/49 128/43 137/47  Pulse: 67 61 72 71  Temp: 97.3 F (36.3 C) 97.8 F (36.6 C) 97.5 F (36.4 C) 97.5 F (36.4 C)  TempSrc: Oral Oral Oral Oral  Resp: 18 18 18 18   Height:      Weight:   191 lb 9.4 oz (86.902 kg)   SpO2: 100% 98% 93% 94%    Intake/Output Summary (Last 24 hours) at 07/10/14 1229 Last data filed at 07/10/14 0954  Gross per 24 hour  Intake   1110 ml  Output   1325 ml  Net   -215 ml   Filed Weights   07/08/14 2139 07/09/14 0541 07/10/14 0547  Weight: 193 lb 1.6 oz (87.59 kg) 193 lb 2 oz (87.6 kg) 191 lb 9.4 oz (86.902 kg)    PHYSICAL EXAM  General: Pleasant, NAD. Neuro: Alert and oriented X 3. Moves all extremities spontaneously. Psych: Normal affect. HEENT:  Normal  Neck: Supple without bruits or JVD. Lungs:  Resp regular and unlabored, CTA. Heart: RRR no s3, s4, or murmurs. Abdomen: Soft, non-tender, non-distended, BS + x 4.   Extremities: No clubbing, cyanosis DP/PT/Radials 2+ and equal bilaterally. Extremity appears cold  Accessory Clinical Findings  CBC  Recent Labs  07/08/14 0235 07/09/14 0945 07/10/14 0410  WBC 19.7* 16.7* 10.6*  NEUTROABS 18.0*  --   --   HGB 9.6* 10.8* 9.7*  HCT 28.3* 32.1* 28.8*  MCV 93.1 93.6 93.8  PLT 8* 10* 6*   Basic Metabolic Panel  Recent Labs  07/09/14 0945 07/10/14 0410  NA 139 139  K 3.8 3.4*  CL 106 104  CO2 22 23  GLUCOSE 141* 135*  BUN 43* 38*  CREATININE 1.57* 1.53*  CALCIUM 8.4 8.2*   Liver Function Tests  Recent Labs  07/09/14 0945 07/10/14 0410  AST 64* 54*  ALT 53 49  ALKPHOS 75 64  BILITOT 1.4* 1.1  PROT 6.5 5.9*  ALBUMIN 2.1* 2.1*    TELE  Atrial sensed with ventricular paced rhythm with HR 70  ECG  No EKG today  Radiology/Studies  Ct Abdomen Pelvis Wo Contrast  07/09/2014   CLINICAL DATA:  Enterococcus bacteremia. Evaluate for abdominal source. Evaluate for potential tumor. Enterobacter is also present in the urine.  EXAM: CT ABDOMEN AND PELVIS WITHOUT CONTRAST  TECHNIQUE: Multidetector CT imaging of the abdomen and pelvis was performed following the standard protocol without IV contrast.  COMPARISON:  No priors.  FINDINGS: Lung Bases: Atherosclerotic calcifications in the right coronary artery. Pacemaker lead terminating in the right ventricular apex. Moderate-sized hiatal hernia. Moderate to large bilateral pleural effusions which appear to layer dependently and are associated with extensive passive atelectasis in the visualized portions of the lower lobes of the lungs bilaterally.  Abdomen/Pelvis: 9 mm low attenuation lesion in segment 2 of the liver is incompletely characterized on today's noncontrast CT examination, but is unchanged, and favored to represent a tiny cyst. No other focal hepatic lesions are identified on today's non contrast CT examination. There is a tiny calcifications in the head of the pancreas (image 31 of  series 2) which appears to be immediately posterior to the distal common bile duct, potentially related to prior episodes of pancreatitis. Other small calcifications are also noted in the head and tail of the pancreas as well. The calcification in the head of the pancreas is not favored to be within the common bile duct, as the common bile duct is normal in caliber measuring 4 mm, and there is no intrahepatic biliary ductal dilatation to suggest obstruction. The unenhanced appearance of the gallbladder, spleen and bilateral adrenal glands is unremarkable. There is bilateral perinephric stranding which is nonspecific. 1.5 cm intermediate attenuation lesion extending exophytically off the lateral aspect of the upper pole of the right kidney is incompletely characterized on today's non contrast CT examination, but is statistically likely to represent a mildly proteinaceous cyst.  Normal appendix. Trace volume of ascites. No pneumoperitoneum. No pathologic distention of small bowel. 10 mm short axis inguinal lymph nodes bilaterally are nonspecific. No other definite lymphadenopathy identified in the abdomen or pelvis on today's non contrast CT examination. Mild diffuse mesenteric edema. Prostate gland is enlarged measuring 4.9 x 6.1 cm. Urinary bladder is generally unremarkable in appearance, however, the attenuation values in the urine are rather high ranging from 45-55 HU, suggesting proteinaceous or hemorrhagic contents.  Musculoskeletal: Mild diffuse body wall edema. There are no aggressive appearing lytic or blastic lesions noted in the visualized portions of the skeleton.  IMPRESSION: 1. No definite source for bacteremia identified on today's examination. 2. Relatively high attenuation of the urine is of uncertain etiology and significance, but may suggest some proteinaceous or hemorrhagic contents. 3. Findings suggestive of anasarca, including moderate to large bilateral pleural effusions, trace volume of  ascites, mild diffuse mesenteric edema and diffuse body wall edema. 4. Prostatomegaly. 5. Additional incidental findings, as above.   Electronically Signed   By: Vinnie Langton M.D.   On: 07/09/2014 12:46   Dg Chest 2 View  07/08/2014   CLINICAL DATA:  Fever, chills, shortness of breath.  EXAM: CHEST  2 VIEW  COMPARISON:  06/10/2014  FINDINGS: Left pacer in place with leads in the right atrium and right ventricle. Heart is normal size. Mild peribronchial thickening. No confluent airspace opacities or effusions. No acute bony abnormality.  IMPRESSION: Bronchitic changes.   Electronically Signed   By: Rolm Baptise M.D.   On: 07/09/2014 15:07   Ct Head Wo Contrast  07/09/2014   CLINICAL DATA:  Hallucinations.  Thrombocytopenia  EXAM: CT HEAD WITHOUT CONTRAST  TECHNIQUE: Contiguous axial images were obtained from the base of the skull through the vertex without intravenous contrast.  COMPARISON:  None.  FINDINGS: Mild atrophy. Negative for hemorrhage. Negative for  acute infarct or mass. Calvarium intact.  IMPRESSION: No acute abnormality.   Electronically Signed   By: Franchot Gallo M.D.   On: 07/09/2014 18:47   Ct Angio Chest W/cm &/or Wo Cm  07/10/2014   CLINICAL DATA:  Shortness of breath and fever  EXAM: CT ANGIOGRAPHY CHEST WITH CONTRAST  TECHNIQUE: Multidetector CT imaging of the chest was performed using the standard protocol during bolus administration of intravenous contrast. Multiplanar CT image reconstructions and MIPs were obtained to evaluate the vascular anatomy.  CONTRAST:  29mL OMNIPAQUE IOHEXOL 350 MG/ML SOLN  COMPARISON:  Chest radiograph July 06, 2014  FINDINGS: There is no demonstrable pulmonary embolus. There is no thoracic aortic aneurysm or dissection.  There are free-flowing pleural effusions bilaterally. There is hazy ground-glass opacity diffusely.  On axial CT slice 55 series 6, there is a 6 x 4 mm nodular opacity in the medial segment of the right middle lobe. On axial slice 64  series 6, there is a 6 x 4 mm nodular opacity in the lateral segment of the left lower lobe. On axial slice 62 series 6, there is a 7 x 5 mm nodular opacity in the lateral segment of the right middle lobe. On axial slice 69 series 6, there is a 5 x 4 mm nodular opacity in the superior segment of the right lower lobe.  There is no appreciable thoracic adenopathy. Pericardium is not thickened. Pacemaker leads are attached to the right atrium and right ventricle. There is a focal hiatal hernia.  Visualized upper abdominal structures appear unremarkable. There are no blastic or lytic bone lesions. There is degenerative change in the thoracic spine. Thyroid appears unremarkable.  Review of the MIP images confirms the above findings.  IMPRESSION: No demonstrable pulmonary embolus.  Bilateral effusions with extensive ground-glass opacity bilaterally. Suspect alveolar edema with findings consistent with congestive heart failure. Atypical infection or allergic type phenomenon are differential considerations, however.  Several nodular opacities, largest measuring 7 mm. Followup of these nodular opacity should be based on Fleischner Society guidelines. If the patient is at high risk for bronchogenic carcinoma, follow-up chest CT at 3-32months is recommended. If the patient is at low risk for bronchogenic carcinoma, follow-up chest CT at 6-12 months is recommended. This recommendation follows the consensus statement: Guidelines for Management of Small Pulmonary Nodules Detected on CT Scans: A Statement from the [employer] as published in Radiology 2005; 237:395-400.  Focal hiatal hernia.  No adenopathy.   Electronically Signed   By: Lowella Grip M.D.   On: 07/17/2014 18:09   Dg Chest Port 1 View  07/07/2014   CLINICAL DATA:  Evaluate airspace.  EXAM: PORTABLE CHEST - 1 VIEW  COMPARISON:  CT 07/29/2014.  Chest x-ray 07/15/2014 and 06/10/2014.  FINDINGS: Cardiac pacer with lead tips in right atrium and right  ventricle. Mediastinum and hilar structures are unremarkable. Cardiomegaly with mild pulmonary vascular prominence and interstitial prominence consistent with congestive heart failure. Small pleural effusions. No pneumothorax. Reference is made to recent chest CT which demonstrated small pulmonary nodules. These are not definitely identified by chest x-ray. No acute bony abnormality.  IMPRESSION: 1. Persistent changes of congestive heart failure pulmonary edema. Small pleural effusions. 2. Reference is made to recent chest CT which demonstrated small pulmonary nodules. Follow-up is recommended as on prior CT report.   Electronically Signed   By: Marcello Moores  Register   On: 07/07/2014 07:26    ASSESSMENT AND PLAN  1. Enterococcal bacteremia   - WBC improving  -  hold off on TEE with anemia and critical thrombocytopenia with platelet of 6 this morning  - potentially do TEE once platelet >50  2. Worsening MR on recent echo - ?if related to endocarditis  3. CAD (coronary artery disease)   - h/o LAD infarct and PACI in PCI in 03/1998  - Nuc 03/2013 no scar or ischemia  4. Anemia   - previous EKG and colonoscopy unrevealing  - s/p 1 unit PRBC on 8/4  5. Severe Thrombocytopenia   - recently worked up for ITP 6. S/p dual chamber pacemaker-Medtronic  7. Acute on chronic diastolic CHF 8. Delirium  - negative CT of head w/o contrast 8/6  - neuro consulted by primary team 9. Chronic renal insufficiency  Signed, Almyra Deforest PA-C Pager: [phone]    Patient seen and examined. Agree with assessment and plan. No chest pain. Breathing better. No spontaneous bleeding; plt 6 k precludes TEE. 1 - 2+ pedal edema; Continue with diuresis.   Troy Sine, MD, [facility] 07/10/2014 2:55 PM

## 2014-07-10 NOTE — Progress Notes (Signed)
Mr. Hakim continues to improve slowly. The and her coccus and Enterobacter are sensitive to most antibiotics.  He had a CT of the had been and pelvis yesterday. This was a negative for any obvious mass. He he did have anasarca. This, I suspect is from all the fluids that he got.  He had CT of the brain. This also was unremarkable.  Is no bleeding.  His platelet count is 6. He had Nplate 3 days ago. Hopefully we will start to see this work. With the sepsis, his platelet destruction has increased. I would not transfuse him at this point.  His hemoglobin is 9.7.  He did do some physical therapy. He definitely will need a lot of help. Physical therapy recommended that home health.  I will question is how long to have him on antibiotics. I with him having a pacemaker in place, he might be considered as endocarditis risk. He says that he may undergo a transesophageal echocardiogram.  Appetite is improving. There is no nausea or vomiting.  He still is on a  Solu-Cortef. He might be able to go back to prednisone. I would put him at 80 mg a day.  The cytogenetics on his bone marrow shows and inversion 10 abnormality. This has been associate with myelodysplasia. It is certainly one of the more unusual abnormalities. However, this might indicate that we could be saying some myelodysplasia and not so much immune thrombocytopenia. Even so, I would still have him get the Nplate which he received.  His vital signs are stable this morning. His temperature is 97.5. Pulse is 72. Blood pressure is 120/43.  Lungs sound pretty good. Has some slight decrease in the bases. No wheezes are noted. Cardiac exam is blennorrhagia and rhythm. He may have a 1/6 systolic ejection murmur. Abdomen is soft. There is no fluid wave. There is no palpable liver or spleen tip. Back exam no tenderness over the spine ribs or hips. Pharynx shows some 1+ edema in his feet. Skin exam shows no petechia. He has some scattered  ecchymoses.  We will continue to follow along. Ibuprofen only outstanding care that he is getting a mall the other doctors and the staff on 3 E.   Stormy Card 3:17

## 2014-07-10 NOTE — Progress Notes (Signed)
All three IV's that patient has are outdated as of today. Attempted to insert #20g IV into left FA. Attempt was unsuccessful. Patient has stated that he would not like to be stuck again since he's getting a picc line placed.  He had 3 IV's dated for 07/15/2014, I removed one of them. With platelet count being so low, the sit where Iv was attempted did bleed a lot. Pressure held to site for about 10 minutes to stop bleeding.

## 2014-07-10 NOTE — Progress Notes (Signed)
Patient ID: Justin LICHTY, male   DOB: 29-Dec-1932, 80 y.o.   MRN: [id number]         [facility] for Infectious Disease    Date of Admission:  07/15/2014           Day 5 vancomycin  Principal Problem:   Enterococcal bacteremia Active Problems:   CAD (coronary artery disease)   Anemia   Thrombocytopenia   Pacemaker-[employer]   Mitral regurgitation   Sepsis   . sodium chloride   Intravenous Once  . antiseptic oral rinse  7 mL Mouth Rinse BID  . atorvastatin  10 mg Oral QPM  . furosemide  80 mg Oral Daily  . insulin aspart  1-3 Units Subcutaneous TID WC & HS  . pantoprazole  40 mg Oral Daily  . predniSONE  80 mg Oral Q breakfast  . pyridOXINE  100 mg Oral QPM  . tamsulosin  0.4 mg Oral QPC supper  . vancomycin  750 mg Intravenous Q12H  . vitamin B-12  1,000 mcg Oral QPM  . vitamin E  400 Units Oral Daily    Subjective: He had a very transient episode this morning of difficulty holding the lid to his breakfast plate. He states that it only lasted seconds and then it resolved. Overall he is feeling much better. He states that he has much more energy and better appetite since admission to the hospital.  Objective: Temp:  [97.3 F (36.3 C)-97.8 F (36.6 C)] 97.8 F (36.6 C) (08/07 1431) Pulse Rate:  [61-72] 69 (08/07 1431) Resp:  [18-20] 20 (08/07 1431) BP: (128-162)/(43-56) 135/48 mmHg (08/07 1431) SpO2:  [93 %-100 %] 100 % (08/07 1431) Weight:  [191 lb 9.4 oz (86.902 kg)] 191 lb 9.4 oz (86.902 kg) (08/07 0547)  General: He is in good spirits sitting up talking to his daughter-in-law Skin: No rash Lungs: Clear Cor: Regular S1 and S2 with a 2/6 systolic murmur. Pacemaker site appears normal Abdomen: Soft and nontender   Lab Results Lab Results  Component Value Date   WBC 10.6* 07/10/2014   HGB 9.7* 07/10/2014   HCT 28.8* 07/10/2014   MCV 93.8 07/10/2014   PLT 6* 07/10/2014    Lab Results  Component Value Date   CREATININE 1.53* 07/10/2014   BUN 38* 07/10/2014   NA  139 07/10/2014   K 3.4* 07/10/2014   CL 104 07/10/2014   CO2 23 07/10/2014    Lab Results  Component Value Date   ALT 49 07/10/2014   AST 54* 07/10/2014   ALKPHOS 64 07/10/2014   BILITOT 1.1 07/10/2014      Microbiology: Recent Results (from the past 240 hour(s))  CULTURE, BLOOD (ROUTINE X 2)     Status: None   Collection Time    07/31/2014  3:30 PM      Result Value Ref Range Status   Specimen Description BLOOD LEFT ARM   Final   Special Requests BOTTLES DRAWN AEROBIC AND ANAEROBIC [facility]   Final   Culture  Setup Time     Final   Value: 07/07/2014 19:23     Performed at [facility]   Culture     Final   Value: ENTEROCOCCUS SPECIES     Note: SUSCEPTIBILITIES PERFORMED ON PREVIOUS CULTURE WITHIN THE LAST 5 DAYS.     Note: Gram Stain Report Called to,Read Back By and Verified With: JAMES ARTIS 07/07/14 AT 0730 [place]     Performed at [facility]   Report Status  07/09/2014 FINAL   Final  URINE CULTURE     Status: None   Collection Time    07/29/2014  4:18 PM      Result Value Ref Range Status   Specimen Description URINE, CLEAN CATCH   Final   Special Requests NONE   Final   Culture  Setup Time     Final   Value: 07/07/2014 02:46     Performed at [place]     Final   Value: 35,000 COLONIES/ML     Performed at [facility]   Culture     Final   Value: ENTEROBACTER CLOACAE     Performed at [facility]   Report Status 07/09/2014 FINAL   Final   Organism ID, Bacteria ENTEROBACTER CLOACAE   Final  CULTURE, BLOOD (ROUTINE X 2)     Status: None   Collection Time    07/07/2014  4:30 PM      Result Value Ref Range Status   Specimen Description BLOOD R ARM   Final   Special Requests BOTTLES DRAWN AEROBIC AND ANAEROBIC [facility] EACH   Final   Culture  Setup Time     Final   Value: 07/17/2014 19:22     Performed at [facility]   Culture     Final   Value: ENTEROCOCCUS SPECIES     Note: Gram Stain Report Called to,Read Back By and  Verified With: JAMES ARTIS 07/07/14 AT 0730 RIDK     Performed at [facility]   Report Status 07/09/2014 FINAL   Final   Organism ID, Bacteria ENTEROCOCCUS SPECIES   Final  MRSA PCR SCREENING     Status: None   Collection Time    07/14/2014  7:52 PM      Result Value Ref Range Status   MRSA by PCR NEGATIVE  NEGATIVE Final   Comment:            The GeneXpert MRSA Assay (FDA     approved for NASAL specimens     only), is one component of a     comprehensive MRSA colonization     surveillance program. It is not     intended to diagnose MRSA     infection nor to guide or     monitor treatment for     MRSA infections.  CULTURE, BLOOD (ROUTINE X 2)     Status: None   Collection Time    07/08/14 12:00 PM      Result Value Ref Range Status   Specimen Description BLOOD RIGHT HAND   Final   Special Requests BOTTLES DRAWN AEROBIC ONLY [facility]   Final   Culture  Setup Time     Final   Value: 07/08/2014 17:14     Performed at [facility]   Culture     Final   Value:        BLOOD CULTURE RECEIVED NO GROWTH TO DATE CULTURE WILL BE HELD FOR 5 DAYS BEFORE ISSUING A FINAL NEGATIVE REPORT     Performed at [facility]   Report Status PENDING   Incomplete  CULTURE, BLOOD (ROUTINE X 2)     Status: None   Collection Time    07/08/14 12:10 PM      Result Value Ref Range Status   Specimen Description BLOOD LEFT HAND   Final   Special Requests BOTTLES DRAWN AEROBIC ONLY [place]   Final  Culture  Setup Time     Final   Value: 07/08/2014 17:05     Performed at [facility]   Culture     Final   Value:        BLOOD CULTURE RECEIVED NO GROWTH TO DATE CULTURE WILL BE HELD FOR 5 DAYS BEFORE ISSUING A FINAL NEGATIVE REPORT     Performed at [facility]   Report Status PENDING   Incomplete    Studies/Results: Ct Abdomen Pelvis Wo Contrast  07/09/2014   CLINICAL DATA:  Enterococcus bacteremia. Evaluate for abdominal source. Evaluate for potential tumor. Enterobacter is  also present in the urine.  EXAM: CT ABDOMEN AND PELVIS WITHOUT CONTRAST  TECHNIQUE: Multidetector CT imaging of the abdomen and pelvis was performed following the standard protocol without IV contrast.  COMPARISON:  No priors.  FINDINGS: Lung Bases: Atherosclerotic calcifications in the right coronary artery. Pacemaker lead terminating in the right ventricular apex. Moderate-sized hiatal hernia. Moderate to large bilateral pleural effusions which appear to layer dependently and are associated with extensive passive atelectasis in the visualized portions of the lower lobes of the lungs bilaterally.  Abdomen/Pelvis: 9 mm low attenuation lesion in segment 2 of the liver is incompletely characterized on today's noncontrast CT examination, but is unchanged, and favored to represent a tiny cyst. No other focal hepatic lesions are identified on today's non contrast CT examination. There is a tiny calcifications in the head of the pancreas (image 31 of series 2) which appears to be immediately posterior to the distal common bile duct, potentially related to prior episodes of pancreatitis. Other small calcifications are also noted in the head and tail of the pancreas as well. The calcification in the head of the pancreas is not favored to be within the common bile duct, as the common bile duct is normal in caliber measuring 4 mm, and there is no intrahepatic biliary ductal dilatation to suggest obstruction. The unenhanced appearance of the gallbladder, spleen and bilateral adrenal glands is unremarkable. There is bilateral perinephric stranding which is nonspecific. 1.5 cm intermediate attenuation lesion extending exophytically off the lateral aspect of the upper pole of the right kidney is incompletely characterized on today's non contrast CT examination, but is statistically likely to represent a mildly proteinaceous cyst.  Normal appendix. Trace volume of ascites. No pneumoperitoneum. No pathologic distention of small  bowel. 10 mm short axis inguinal lymph nodes bilaterally are nonspecific. No other definite lymphadenopathy identified in the abdomen or pelvis on today's non contrast CT examination. Mild diffuse mesenteric edema. Prostate gland is enlarged measuring 4.9 x 6.1 cm. Urinary bladder is generally unremarkable in appearance, however, the attenuation values in the urine are rather high ranging from 45-55 HU, suggesting proteinaceous or hemorrhagic contents.  Musculoskeletal: Mild diffuse body wall edema. There are no aggressive appearing lytic or blastic lesions noted in the visualized portions of the skeleton.  IMPRESSION: 1. No definite source for bacteremia identified on today's examination. 2. Relatively high attenuation of the urine is of uncertain etiology and significance, but may suggest some proteinaceous or hemorrhagic contents. 3. Findings suggestive of anasarca, including moderate to large bilateral pleural effusions, trace volume of ascites, mild diffuse mesenteric edema and diffuse body wall edema. 4. Prostatomegaly. 5. Additional incidental findings, as above.   Electronically Signed   By: Vinnie Langton M.D.   On: 07/09/2014 12:46   Ct Head Wo Contrast  07/09/2014   CLINICAL DATA:  Hallucinations.  Thrombocytopenia  EXAM: CT HEAD WITHOUT CONTRAST  TECHNIQUE: Contiguous axial images were obtained from the base of the skull through the vertex without intravenous contrast.  COMPARISON:  None.  FINDINGS: Mild atrophy. Negative for hemorrhage. Negative for acute infarct or mass. Calvarium intact.  IMPRESSION: No acute abnormality.   Electronically Signed   By: Franchot Gallo M.D.   On: 07/09/2014 18:47    Assessment: He is improving on therapy for enterococcal bacteremia. He is unable to undergo TEE currently because of continued, severe thrombocytopenia. He is at high risk for native valve and pacemaker wire endocarditis. He would prefer to treat him with high-dose IV ampicillin and gentamicin but he  still has some acute renal insufficiency. If his blood cultures remain negative tomorrow I would go ahead with PICC placement.  Plan: 1. Continue vancomycin alone for now but consider switching to ampicillin and gentamicin soon if his renal function continues to improve 2. PICC placement tomorrow okay if his blood cultures remained negative  Michel Bickers, MD [facility] for [place] [phone] pager   [phone] cell 07/10/2014, 2:55 PM

## 2014-07-10 NOTE — Progress Notes (Signed)
UR completed Kenric Ginger K. Dontavion Noxon, RN, BSN, [place], CCM  07/10/2014 12:03 PM

## 2014-07-10 NOTE — Care Management Note (Addendum)
    Page 1 of 2   08/31/2014     10:51:29 AM CARE MANAGEMENT NOTE 08/31/2014  Patient:  Justin [name], Justin [name]   Account Number:  [id number]  Date Initiated:  07/07/2014  Documentation initiated by:  [facility]  Subjective/Objective Assessment:   Admitted with sepsis     Action/Plan:   Anticipated DC Date:     Anticipated DC Plan:  [place] referral  Clinical Social Worker      DC Planning Services  CM consult      Choice offered to / List presented to:  C-1 Patient           Status of service:  Completed, signed off Medicare Important Message given?  YES (If response is "NO", the following Medicare IM given date fields will be blank) Date Medicare IM given:  08/27/2014 Medicare IM given by:  Elissa Hefty Date Additional Medicare IM given:  08/31/2014 Additional Medicare IM given by:  [facility] Ronald Vinsant  Discharge Disposition:    Per UR Regulation:  Reviewed for med. necessity/level of care/duration of stay  If discussed at [place] of Stay Meetings, dates discussed:   07/07/2014  07/16/2014  07/21/2014  07/23/2014  08/11/2014  08/13/2014  08/18/2014  08/11/2014  08/25/2014  08/27/2014  09-24-14    Comments:  9/24  0933 debbie Ahnya Akre rn,bsn spoke w pt. he prefers to go to [facility] but if they cannot handle pleurex drains he would be willing to explore option of cir vs ltac. will have soc work ck w [facility] to see if they can take pleurex cath if needed.  [id number] Davis,RN,BSN,CCM: hemodialysis on [id number]/tolerated well/remains hypotensive and weak/for pleurx-cath insertions today to increase lung expansion/last dose of iv ampicillin later today and iv fluconazole continued-may go home with x1 month/iv heparin. Poss.  good ltach candidate due to ppm,iv needs and pleurx cath/ PICC line is patent.  [id number] Davis,RN,BSN,CCM: Patient continues to have difficulty with CRRT clotting/iv inotrope continuing/pod42/stable at this  point/iv heparin started.  08/04/14 Ellan Lambert, RN, BSN [phone] CSW cont to follow for dc to SNF when medically stable. Will follow progress.  07/25/2014 [place] MSN BSN CCM To cath lab for pacer.  07/29/14 1012 [place] MSN BSN CCM Pt is a resident of [facility] (I) living - notified CSW that pt will need SNF for rehab - can d/c to [facility] when medically stable.  Talked with son who agrees.  Pt ambulated short distance with PT, PTA.  07/27/14 1201 [place] MSN BSN CCM Admitted to 2S s/p CABG/AVR/ARR; extubated-on 4L [place], remains on dopamine gtt.  Crystal Hutchinson RN, BSN, MSH[facility], CCM  Nurse - Case Manager,  ([facility] [place])  [phone]  07/10/2014 PT RECS:  HH PT Dispositon Plan:  HHS:  PT and possible IV ABX pending and will need PICC prior to d/c.    Contact:  Boghosian,Dolores Spouse [phone]   [phone]

## 2014-07-10 NOTE — Progress Notes (Signed)
Complained of right hand numbness. Patient stated that "I tried to pick up my tray top , but i could not pick it up" . Numbness lasted for about 3 minutes, then it went away.

## 2014-07-11 DIAGNOSIS — T827XXA Infection and inflammatory reaction due to other cardiac and vascular devices, implants and grafts, initial encounter: Secondary | ICD-10-CM

## 2014-07-11 DIAGNOSIS — D469 Myelodysplastic syndrome, unspecified: Secondary | ICD-10-CM

## 2014-07-11 DIAGNOSIS — D631 Anemia in chronic kidney disease: Secondary | ICD-10-CM

## 2014-07-11 DIAGNOSIS — N039 Chronic nephritic syndrome with unspecified morphologic changes: Secondary | ICD-10-CM

## 2014-07-11 DIAGNOSIS — Y849 Medical procedure, unspecified as the cause of abnormal reaction of the patient, or of later complication, without mention of misadventure at the time of the procedure: Secondary | ICD-10-CM

## 2014-07-11 DIAGNOSIS — N189 Chronic kidney disease, unspecified: Secondary | ICD-10-CM

## 2014-07-11 DIAGNOSIS — I495 Sick sinus syndrome: Secondary | ICD-10-CM

## 2014-07-11 DIAGNOSIS — I441 Atrioventricular block, second degree: Secondary | ICD-10-CM

## 2014-07-11 DIAGNOSIS — R7989 Other specified abnormal findings of blood chemistry: Secondary | ICD-10-CM

## 2014-07-11 DIAGNOSIS — R7309 Other abnormal glucose: Secondary | ICD-10-CM

## 2014-07-11 DIAGNOSIS — I669 Occlusion and stenosis of unspecified cerebral artery: Secondary | ICD-10-CM

## 2014-07-11 DIAGNOSIS — A409 Streptococcal sepsis, unspecified: Principal | ICD-10-CM

## 2014-07-11 LAB — CBC
HCT: 28.7 % — ABNORMAL LOW (ref 39.0–52.0)
HEMOGLOBIN: 9.4 g/dL — AB (ref 13.0–17.0)
MCH: 31.9 pg (ref 26.0–34.0)
MCHC: 32.8 g/dL (ref 30.0–36.0)
MCV: 97.3 fL (ref 78.0–100.0)
PLATELETS: 8 10*3/uL — AB (ref 150–400)
RBC: 2.95 MIL/uL — AB (ref 4.22–5.81)
RDW: 18.4 % — ABNORMAL HIGH (ref 11.5–15.5)
WBC: 12.1 10*3/uL — ABNORMAL HIGH (ref 4.0–10.5)

## 2014-07-11 LAB — GLUCOSE, CAPILLARY
GLUCOSE-CAPILLARY: 122 mg/dL — AB (ref 70–99)
GLUCOSE-CAPILLARY: 249 mg/dL — AB (ref 70–99)
Glucose-Capillary: 157 mg/dL — ABNORMAL HIGH (ref 70–99)
Glucose-Capillary: 203 mg/dL — ABNORMAL HIGH (ref 70–99)

## 2014-07-11 LAB — COMPREHENSIVE METABOLIC PANEL
ALK PHOS: 68 U/L (ref 39–117)
ALT: 47 U/L (ref 0–53)
AST: 56 U/L — AB (ref 0–37)
Albumin: 2.1 g/dL — ABNORMAL LOW (ref 3.5–5.2)
Anion gap: 15 (ref 5–15)
BILIRUBIN TOTAL: 0.9 mg/dL (ref 0.3–1.2)
BUN: 39 mg/dL — ABNORMAL HIGH (ref 6–23)
CO2: 21 meq/L (ref 19–32)
Calcium: 8.3 mg/dL — ABNORMAL LOW (ref 8.4–10.5)
Chloride: 105 mEq/L (ref 96–112)
Creatinine, Ser: 1.43 mg/dL — ABNORMAL HIGH (ref 0.50–1.35)
GFR calc non Af Amer: 45 mL/min — ABNORMAL LOW (ref >90)
GFR, EST AFRICAN AMERICAN: 52 mL/min — AB (ref >90)
GLUCOSE: 137 mg/dL — AB (ref 70–99)
POTASSIUM: 3.7 meq/L (ref 3.7–5.3)
Sodium: 141 mEq/L (ref 137–147)
Total Protein: 5.9 g/dL — ABNORMAL LOW (ref 6.0–8.3)

## 2014-07-11 MED ORDER — SODIUM CHLORIDE 0.9 % IV SOLN
2.0000 g | Freq: Four times a day (QID) | INTRAVENOUS | Status: DC
  Administered 2014-07-11 – 2014-07-27 (×59): 2 g via INTRAVENOUS
  Filled 2014-07-11 (×67): qty 2000

## 2014-07-11 MED ORDER — DARBEPOETIN ALFA-POLYSORBATE 300 MCG/0.6ML IJ SOLN
300.0000 ug | Freq: Once | INTRAMUSCULAR | Status: AC
  Administered 2014-07-11: 300 ug via SUBCUTANEOUS
  Filled 2014-07-11: qty 0.6

## 2014-07-11 NOTE — Progress Notes (Addendum)
[facility]    Subjective: He had trouble with hand grip yesterday and numbness that was temporary concerning for possible TIA versus stroke   Antibiotics:  Anti-infectives   Start     Dose/Rate Route Frequency Ordered Stop   07/11/14 1200  ampicillin (OMNIPEN) 2 g in sodium chloride 0.9 % 50 mL IVPB     2 g 150 mL/hr over 20 Minutes Intravenous 4 times per day 07/11/14 0945     07/07/14 0030  piperacillin-tazobactam (ZOSYN) IVPB 3.375 g  Status:  Discontinued     3.375 g 12.5 mL/hr over 240 Minutes Intravenous Every 8 hours 07/28/2014 2042 07/08/14 1602   07/11/2014 2300  levofloxacin (LEVAQUIN) IVPB 750 mg  Status:  Discontinued     750 mg 100 mL/hr over 90 Minutes Intravenous Every 48 hours 07/05/2014 2232 07/08/14 1022   07/25/2014 1615  piperacillin-tazobactam (ZOSYN) IVPB 3.375 g     3.375 g 100 mL/hr over 30 Minutes Intravenous  Once 07/25/2014 1604 07/28/2014 1755   08/02/2014 1615  vancomycin (VANCOCIN) IVPB 1000 mg/200 mL premix     1,000 mg 200 mL/hr over 60 Minutes Intravenous  Once 07/31/2014 1604 07/16/2014 1930   07/26/2014 0030  vancomycin (VANCOCIN) IVPB 750 mg/150 ml premix  Status:  Discontinued     750 mg 150 mL/hr over 60 Minutes Intravenous Every 12 hours 07/25/2014 2042 07/11/14 0945      Medications: Scheduled Meds: . sodium chloride   Intravenous Once  . ampicillin (OMNIPEN) IV  2 g Intravenous 4 times per day  . antiseptic oral rinse  7 mL Mouth Rinse BID  . atorvastatin  10 mg Oral QPM  . darbepoetin (ARANESP) injection - NON-DIALYSIS  300 mcg Subcutaneous Once  . furosemide  80 mg Oral Daily  . insulin aspart  0-9 Units Subcutaneous TID WC  . pantoprazole  40 mg Oral Daily  . predniSONE  80 mg Oral Q breakfast  . pyridOXINE  100 mg Oral QPM  . tamsulosin  0.4 mg Oral QPC supper  . vitamin E  400 Units Oral Daily   Continuous Infusions:  PRN Meds:.sodium chloride, acetaminophen, polyvinyl alcohol    Objective: Weight change: 1  lb 5.1 oz (0.598 kg)  Intake/Output Summary (Last 24 hours) at 07/11/14 1036 Last data filed at 07/11/14 0828  Gross per 24 hour  Intake    960 ml  Output   1325 ml  Net   -365 ml   Blood pressure 132/52, pulse 62, temperature 97.4 F (36.3 C), temperature source Oral, resp. rate 20, height 5\' 11"  (1.803 m), weight 192 lb 14.4 oz (87.5 kg), SpO2 100.00%. Temp:  [97.4 F (36.3 C)-97.9 F (36.6 C)] 97.4 F (36.3 C) (08/08 0636) Pulse Rate:  [62-71] 62 (08/08 0636) Resp:  [18-20] 20 (08/08 0636) BP: (126-137)/(47-52) 132/52 mmHg (08/08 0636) SpO2:  [94 %-100 %] 100 % (08/08 0636) Weight:  [192 lb 14.4 oz (87.5 kg)] 192 lb 14.4 oz (87.5 kg) (08/08 0636)  Physical Exam: General: Alert and awake, oriented x3, not in any acute distress. HEENT: anicteric sclera, pupils reactive to light and accommodation, EOMI CVS irregularly irregular rate, normal r,  no murmur rubs or gallops Chest: clear to auscultation bilaterally, no wheezing, rales or rhonchi Abdomen: soft nontender, nondistended, normal bowel sounds, Extremities: no  clubbing or edema noted bilaterally Skin: Some ecchymoses pacemaker pocket itself is not overtly infected Neuro: nonfocal  CBC:  Recent Labs Lab 07/31/2014 1530 08/01/2014 2140 07/07/14  5361 07/08/14 0235 07/09/14 0945 07/10/14 0410 07/11/14 0345  HGB 9.8*  --  8.6* 9.6* 10.8* 9.7* 9.4*  HCT 30.4*  --  26.1* 28.3* 32.1* 28.8* 28.7*  PLT 8*  --  11* 8* 10* 6* 8*  INR  --  1.48  --   --   --   --   --      BMET  Recent Labs  07/10/14 0410 07/11/14 0345  NA 139 141  K 3.4* 3.7  CL 104 105  CO2 23 21  GLUCOSE 135* 137*  BUN 38* 39*  CREATININE 1.53* 1.43*  CALCIUM 8.2* 8.3*     Liver Panel   Recent Labs  07/10/14 0410 07/11/14 0345  PROT 5.9* 5.9*  ALBUMIN 2.1* 2.1*  AST 54* 56*  ALT 49 47  ALKPHOS 64 68  BILITOT 1.1 0.9       Sedimentation Rate No results found for this basename: ESRSEDRATE,  in the last 72 hours C-Reactive  Protein No results found for this basename: CRP,  in the last 72 hours  Micro Results: Recent Results (from the past 720 hour(s))  CULTURE, BLOOD (ROUTINE X 2)     Status: None   Collection Time    07/27/2014  3:30 PM      Result Value Ref Range Status   Specimen Description BLOOD LEFT ARM   Final   Special Requests BOTTLES DRAWN AEROBIC AND ANAEROBIC [facility]   Final   Culture  Setup Time     Final   Value: 07/12/2014 19:23     Performed at [facility]   Culture     Final   Value: ENTEROCOCCUS SPECIES     Note: SUSCEPTIBILITIES PERFORMED ON PREVIOUS CULTURE WITHIN THE LAST 5 DAYS.     Note: Gram Stain Report Called to,Read Back By and Verified With: JAMES ARTIS 07/07/14 AT 0730 RIDK     Performed at [facility]   Report Status 07/09/2014 FINAL   Final  URINE CULTURE     Status: None   Collection Time    07/22/2014  4:18 PM      Result Value Ref Range Status   Specimen Description URINE, CLEAN CATCH   Final   Special Requests NONE   Final   Culture  Setup Time     Final   Value: 07/07/2014 02:46     Performed at [place]     Final   Value: 35,000 COLONIES/ML     Performed at [facility]   Culture     Final   Value: ENTEROBACTER CLOACAE     Performed at [facility]   Report Status 07/09/2014 FINAL   Final   Organism ID, Bacteria ENTEROBACTER CLOACAE   Final  CULTURE, BLOOD (ROUTINE X 2)     Status: None   Collection Time    07/24/2014  4:30 PM      Result Value Ref Range Status   Specimen Description BLOOD R ARM   Final   Special Requests BOTTLES DRAWN AEROBIC AND ANAEROBIC [place]   Final   Culture  Setup Time     Final   Value: 07/15/2014 19:22     Performed at [facility]   Culture     Final   Value: ENTEROCOCCUS SPECIES     Note: Gram Stain Report Called to,Read Back By and Verified With: JAMES ARTIS 07/07/14 AT [place]     Performed  at [facility]   Report Status 07/09/2014 FINAL   Final    Organism ID, Bacteria ENTEROCOCCUS SPECIES   Final  MRSA PCR SCREENING     Status: None   Collection Time    07/28/2014  7:52 PM      Result Value Ref Range Status   MRSA by PCR NEGATIVE  NEGATIVE Final   Comment:            The GeneXpert MRSA Assay (FDA     approved for NASAL specimens     only), is one component of a     comprehensive MRSA colonization     surveillance program. It is not     intended to diagnose MRSA     infection nor to guide or     monitor treatment for     MRSA infections.  CULTURE, BLOOD (ROUTINE X 2)     Status: None   Collection Time    07/08/14 12:00 PM      Result Value Ref Range Status   Specimen Description BLOOD RIGHT HAND   Final   Special Requests BOTTLES DRAWN AEROBIC ONLY [facility]   Final   Culture  Setup Time     Final   Value: 07/08/2014 17:14     Performed at [facility]   Culture     Final   Value:        BLOOD CULTURE RECEIVED NO GROWTH TO DATE CULTURE WILL BE HELD FOR 5 DAYS BEFORE ISSUING A FINAL NEGATIVE REPORT     Performed at [facility]   Report Status PENDING   Incomplete  CULTURE, BLOOD (ROUTINE X 2)     Status: None   Collection Time    07/08/14 12:10 PM      Result Value Ref Range Status   Specimen Description BLOOD LEFT HAND   Final   Special Requests BOTTLES DRAWN AEROBIC ONLY [facility]   Final   Culture  Setup Time     Final   Value: 07/08/2014 17:05     Performed at [facility]   Culture     Final   Value:        BLOOD CULTURE RECEIVED NO GROWTH TO DATE CULTURE WILL BE HELD FOR 5 DAYS BEFORE ISSUING A FINAL NEGATIVE REPORT     Performed at [facility]   Report Status PENDING   Incomplete    Studies/Results: Ct Abdomen Pelvis Wo Contrast  07/09/2014   CLINICAL DATA:  Enterococcus bacteremia. Evaluate for abdominal source. Evaluate for potential tumor. Enterobacter is also present in the urine.  EXAM: CT ABDOMEN AND PELVIS WITHOUT CONTRAST  TECHNIQUE: Multidetector CT imaging of the abdomen and  pelvis was performed following the standard protocol without IV contrast.  COMPARISON:  No priors.  FINDINGS: Lung Bases: Atherosclerotic calcifications in the right coronary artery. Pacemaker lead terminating in the right ventricular apex. Moderate-sized hiatal hernia. Moderate to large bilateral pleural effusions which appear to layer dependently and are associated with extensive passive atelectasis in the visualized portions of the lower lobes of the lungs bilaterally.  Abdomen/Pelvis: 9 mm low attenuation lesion in segment 2 of the liver is incompletely characterized on today's noncontrast CT examination, but is unchanged, and favored to represent a tiny cyst. No other focal hepatic lesions are identified on today's non contrast CT examination. There is a tiny calcifications in the head of the pancreas (image 31 of series 2) which appears to be immediately posterior to  the distal common bile duct, potentially related to prior episodes of pancreatitis. Other small calcifications are also noted in the head and tail of the pancreas as well. The calcification in the head of the pancreas is not favored to be within the common bile duct, as the common bile duct is normal in caliber measuring 4 mm, and there is no intrahepatic biliary ductal dilatation to suggest obstruction. The unenhanced appearance of the gallbladder, spleen and bilateral adrenal glands is unremarkable. There is bilateral perinephric stranding which is nonspecific. 1.5 cm intermediate attenuation lesion extending exophytically off the lateral aspect of the upper pole of the right kidney is incompletely characterized on today's non contrast CT examination, but is statistically likely to represent a mildly proteinaceous cyst.  Normal appendix. Trace volume of ascites. No pneumoperitoneum. No pathologic distention of small bowel. 10 mm short axis inguinal lymph nodes bilaterally are nonspecific. No other definite lymphadenopathy identified in the  abdomen or pelvis on today's non contrast CT examination. Mild diffuse mesenteric edema. Prostate gland is enlarged measuring 4.9 x 6.1 cm. Urinary bladder is generally unremarkable in appearance, however, the attenuation values in the urine are rather high ranging from 45-55 HU, suggesting proteinaceous or hemorrhagic contents.  Musculoskeletal: Mild diffuse body wall edema. There are no aggressive appearing lytic or blastic lesions noted in the visualized portions of the skeleton.  IMPRESSION: 1. No definite source for bacteremia identified on today's examination. 2. Relatively high attenuation of the urine is of uncertain etiology and significance, but may suggest some proteinaceous or hemorrhagic contents. 3. Findings suggestive of anasarca, including moderate to large bilateral pleural effusions, trace volume of ascites, mild diffuse mesenteric edema and diffuse body wall edema. 4. Prostatomegaly. 5. Additional incidental findings, as above.   Electronically Signed   By: Vinnie Langton M.D.   On: 07/09/2014 12:46   Ct Head Wo Contrast  07/09/2014   CLINICAL DATA:  Hallucinations.  Thrombocytopenia  EXAM: CT HEAD WITHOUT CONTRAST  TECHNIQUE: Contiguous axial images were obtained from the base of the skull through the vertex without intravenous contrast.  COMPARISON:  None.  FINDINGS: Mild atrophy. Negative for hemorrhage. Negative for acute infarct or mass. Calvarium intact.  IMPRESSION: No acute abnormality.   Electronically Signed   By: Franchot Gallo M.D.   On: 07/09/2014 18:47      Assessment/Plan:  Principal Problem:   Enterococcal bacteremia Active Problems:   CAD (coronary artery disease)   Anemia   Thrombocytopenia   Pacemaker-[employer]   Mitral regurgitation   Sepsis   TIA (transient ischemic attack)    Justin [name] is a 80 y.o. 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 I certainly can appreciate however that with his thrombocytopenia in this endeavor will not be trivial.  I will switch him from vancomycin to ampicillin today. I will let the vancomycin wash out of his system and had gentamicin tomorrow given his improvement in renal function.  If he cannot tolerate gentamicin with ampicillin Ultram ampicillin with high-dose ceftriaxone.  I agree with pursuing transesophageal echocardiogram and would to consider platelet transfusion to facilitate this if necessary.  I think he should also be but seen by Crissie Sickles for consideration of pacemaker removal. The  fact that he is having symptoms concerning for TIA and/or stroke made me very concerned that he is embolizing from the pacemaker or his heart valve despite effective antibiotics which would by text [place] indication not just for pacemaker removal but also heart valve replacement. She is not going to be a good candidate for open heart surgery.  I do think however we should strongly consider removal of the pacemaker. Again if the TEE can help further in the form that decision all the better.  With re to the PICC line insertion, my preference would be to withhold placing the [facility] line and less as absolutely needed for access. I'm concerned about the fact that his infection is not controlled and that he could have recurrence of his bacteremia and seating of a PICC line. Therefore I would continue to hold on placement of PICC line. Again if it is absolutely needed to facilitate his care including pacemaker removal for example I would not oppose it  I spent greater than 40 minutes with the patient including greater than 50% of time in face to face counsel of the patient and in coordination of their care.  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  #3 renal failure: Creatinine is improved today again I let his vancomycin wash out of his system before I had gentamicin and worried about his ability to tolerate this    LOS: 5 days   Alcide Evener 07/11/2014, 10:36 AM

## 2014-07-11 NOTE — Progress Notes (Signed)
Stroke Team Progress Note  HISTORY Justin [name] is an 80 y.o. male who was initially brought to hospital for dyspnea. He is currently being followed by Dr. Marin Olp for ITP and treated with systemic steroids and was treated with IVIG. While in hospital he was noted to have both enterococcus and Enterobacter in his urine and blood. ID has been following. No obvious source of bacteremia has been found. This AM he went to pick up the top to the dish and noted he had a hard time grasping and feeling the lid with his right hand. He states "this only lasted for a few seconds" then resolved. "If he was not in the hospital he probably would have not thought anything about it". Currently he is back to his baseline and feels fine. Cardiology was consulted for possible worsening of MR. They have reviewed the echo and feels it has not worsened. Patient was to get a TEE for possibility Of endocarditis but due to PLT of 6 this is going to be postponed.  Note --Patient has pacer and cannot obtain MRI  Patient was not administered TPA secondary to low platelets. He was admitted to the medical floor for further evaluation and treatment.  SUBJECTIVE His daughter is at the bedside.  Overall he feels his condition is stable.  TEE not done due to low platelets. Dr Tommy Medal at bedside and discussed case with me  OBJECTIVE Most recent Vital Signs: Filed Vitals:   07/10/14 1042 07/10/14 1431 07/10/14 2056 07/11/14 0636  BP: 137/47 135/48 126/52 132/52  Pulse: 71 69 63 62  Temp: 97.5 F (36.4 C) 97.8 F (36.6 C) 97.9 F (36.6 C) 97.4 F (36.3 C)  TempSrc: Oral Oral Oral Oral  Resp: 18 20 20 20   Height:      Weight:    87.5 kg (192 lb 14.4 oz)  SpO2: 94% 100% 99% 100%   CBG (last 3)   Recent Labs  07/10/14 2103 07/11/14 0619 07/11/14 1101  GLUCAP 174* 122* 249*    IV Fluid Intake:     MEDICATIONS  . sodium chloride   Intravenous Once  . ampicillin (OMNIPEN) IV  2 g Intravenous 4 times per day  .  antiseptic oral rinse  7 mL Mouth Rinse BID  . atorvastatin  10 mg Oral QPM  . furosemide  80 mg Oral Daily  . insulin aspart  0-9 Units Subcutaneous TID WC  . pantoprazole  40 mg Oral Daily  . predniSONE  80 mg Oral Q breakfast  . pyridOXINE  100 mg Oral QPM  . tamsulosin  0.4 mg Oral QPC supper  . vitamin E  400 Units Oral Daily   PRN:  sodium chloride, acetaminophen, polyvinyl alcohol  Diet:  Cardiac   Activity:  Bedrest  DVT Prophylaxis:  SCds CLINICALLY SIGNIFICANT STUDIES Basic Metabolic Panel:  Recent Labs Lab 07/13/2014 1530 07/16/2014 2315  07/10/14 0410 07/11/14 0345  NA 136*  --   < > 139 141  K 4.6  --   < > 3.4* 3.7  CL 99  --   < > 104 105  CO2 24  --   < > 23 21  GLUCOSE 130*  --   < > 135* 137*  BUN 45*  --   < > 38* 39*  CREATININE 1.50*  --   < > 1.53* 1.43*  CALCIUM 8.9  --   < > 8.2* 8.3*  MG  --  2.0  --   --   --   < > =  values in this interval not displayed. Liver Function Tests:  Recent Labs Lab 07/10/14 0410 07/11/14 0345  AST 54* 56*  ALT 49 47  ALKPHOS 64 68  BILITOT 1.1 0.9  PROT 5.9* 5.9*  ALBUMIN 2.1* 2.1*   CBC:  Recent Labs Lab 07/26/2014 1530  07/08/14 0235  07/10/14 0410 07/11/14 0345  WBC 13.4*  < > 19.7*  < > 10.6* 12.1*  NEUTROABS 12.4*  --  18.0*  --   --   --   HGB 9.8*  < > 9.6*  < > 9.7* 9.4*  HCT 30.4*  < > 28.3*  < > 28.8* 28.7*  MCV 96.5  < > 93.1  < > 93.8 97.3  PLT 8*  < > 8*  < > 6* 8*  < > = values in this interval not displayed. Coagulation:  Recent Labs Lab 08/01/2014 2140  LABPROT 17.9*  INR 1.48   Cardiac Enzymes:  Recent Labs Lab 07/05/2014 1530 07/20/2014 2140  TROPONINI 0.68* 1.59*   Urinalysis:  Recent Labs Lab 07/05/2014 1617  COLORURINE AMBER*  LABSPEC 1.017  PHURINE 5.0  GLUCOSEU NEGATIVE  HGBUR LARGE*  BILIRUBINUR NEGATIVE  KETONESUR NEGATIVE  PROTEINUR 100*  UROBILINOGEN 1.0  NITRITE NEGATIVE  LEUKOCYTESUR MODERATE*   Lipid Panel    Component Value Date/Time   CHOL 126 01/13/2014  0849   TRIG 66 01/13/2014 0849   HDL 49 01/13/2014 0849   CHOLHDL 2.6 01/13/2014 0849   VLDL 13 01/13/2014 0849   LDLCALC 64 01/13/2014 0849   HgbA1C  Lab Results  Component Value Date   HGBA1C 5.5 04/14/2014    Urine Drug Screen:   No results found for this basename: labopia, cocainscrnur, labbenz, amphetmu, thcu, labbarb    Alcohol Level: No results found for this basename: ETH,  in the last 168 hours  Ct Abdomen Pelvis Wo Contrast  07/09/2014   CLINICAL DATA:  Enterococcus bacteremia. Evaluate for abdominal source. Evaluate for potential tumor. Enterobacter is also present in the urine.  EXAM: CT ABDOMEN AND PELVIS WITHOUT CONTRAST  TECHNIQUE: Multidetector CT imaging of the abdomen and pelvis was performed following the standard protocol without IV contrast.  COMPARISON:  No priors.  FINDINGS: Lung Bases: Atherosclerotic calcifications in the right coronary artery. Pacemaker lead terminating in the right ventricular apex. Moderate-sized hiatal hernia. Moderate to large bilateral pleural effusions which appear to layer dependently and are associated with extensive passive atelectasis in the visualized portions of the lower lobes of the lungs bilaterally.  Abdomen/Pelvis: 9 mm low attenuation lesion in segment 2 of the liver is incompletely characterized on today's noncontrast CT examination, but is unchanged, and favored to represent a tiny cyst. No other focal hepatic lesions are identified on today's non contrast CT examination. There is a tiny calcifications in the head of the pancreas (image 31 of series 2) which appears to be immediately posterior to the distal common bile duct, potentially related to prior episodes of pancreatitis. Other small calcifications are also noted in the head and tail of the pancreas as well. The calcification in the head of the pancreas is not favored to be within the common bile duct, as the common bile duct is normal in caliber measuring 4 mm, and there is no  intrahepatic biliary ductal dilatation to suggest obstruction. The unenhanced appearance of the gallbladder, spleen and bilateral adrenal glands is unremarkable. There is bilateral perinephric stranding which is nonspecific. 1.5 cm intermediate attenuation lesion extending exophytically off the lateral aspect of the  upper pole of the right kidney is incompletely characterized on today's non contrast CT examination, but is statistically likely to represent a mildly proteinaceous cyst.  Normal appendix. Trace volume of ascites. No pneumoperitoneum. No pathologic distention of small bowel. 10 mm short axis inguinal lymph nodes bilaterally are nonspecific. No other definite lymphadenopathy identified in the abdomen or pelvis on today's non contrast CT examination. Mild diffuse mesenteric edema. Prostate gland is enlarged measuring 4.9 x 6.1 cm. Urinary bladder is generally unremarkable in appearance, however, the attenuation values in the urine are rather high ranging from 45-55 HU, suggesting proteinaceous or hemorrhagic contents.  Musculoskeletal: Mild diffuse body wall edema. There are no aggressive appearing lytic or blastic lesions noted in the visualized portions of the skeleton.  IMPRESSION: 1. No definite source for bacteremia identified on today's examination. 2. Relatively high attenuation of the urine is of uncertain etiology and significance, but may suggest some proteinaceous or hemorrhagic contents. 3. Findings suggestive of anasarca, including moderate to large bilateral pleural effusions, trace volume of ascites, mild diffuse mesenteric edema and diffuse body wall edema. 4. Prostatomegaly. 5. Additional incidental findings, as above.   Electronically Signed   By: Vinnie Langton M.D.   On: 07/09/2014 12:46   Ct Head Wo Contrast  07/09/2014   CLINICAL DATA:  Hallucinations.  Thrombocytopenia  EXAM: CT HEAD WITHOUT CONTRAST  TECHNIQUE: Contiguous axial images were obtained from the base of the skull  through the vertex without intravenous contrast.  COMPARISON:  None.  FINDINGS: Mild atrophy. Negative for hemorrhage. Negative for acute infarct or mass. Calvarium intact.  IMPRESSION: No acute abnormality.   Electronically Signed   By: Franchot Gallo M.D.   On: 07/09/2014 18:47        MRI of the brain  pacer  MRA of the brain  pacer  Carotid Doppler  ordered  2D Echocardiogram  Systolic function was normal. The estimated ejection fraction was in the range of 50% to 55%. Wall motion was normal; there were no regional wall motion abnormalities.    CXR  07/07/2014 Persistent changes of congestive heart failure pulmonary edema.  Small pleural effusions   EKG 07/28/2014 Atrial-sensed ventricular-paced rhythm Therapy Recommendations  None as TIA  Physical Exam  pleasant elderly caucasian male not in distress.Awake alert. Afebrile. Head is nontraumatic. Neck is supple without bruit. Hearing is normal. Cardiac exam no murmur or gallop. Lungs are clear to auscultation. Distal pulses are well felt. Neurological Exam ;  Awake  Alert oriented x 3. Normal speech and language.eye movements full without nystagmus.fundi were not visualized. Vision acuity and fields appear normal. Hearing is normal. Palatal movements are normal. Face symmetric. Tongue midline. Normal strength, tone, reflexes and coordination. Normal sensation. Gait deferred. ASSESSMENT Justin [name] is a 80 y.o. male presenting with  Transient right hand weakness and numbness likely from left brain TIA.  Etiology to be determined. Possiblities include endocarditis given enterococcus bacteremia but stroke w/u pending.  On no antiplatelets prior to admission. Now on no antiplatelet for secondary stroke prevention. Patient with resultant  No deficits.. Stroke work up underway.   Enterococcus bacteremia  LDL 64  HbA1c 5.5   Hospital day # 5  TREATMENT/PLAN  Agree to hold antiplatelts for secondary stroke prevention until  platelet count greater than 50,000  Check carotid dopplers  TEE would be beneficial to decide on duration of antibiotics when deemed safe to be done by cardiology  May need pacer removal if endocarditis confirmed  D/W patient  and daughter and Dr Tommy Medal & Hilty x 15 minutes I have personally examined this patient, reviewed notes, independently viewed imaging studies, participated in medical decision making of high complexityand developed plan of care.The patient remains at significant risk for recurrent stroke,. Bleeding and neurological worsening. Antony Contras, MD Medical Director [place] Pager: [phone] 07/11/2014 12:15 PM  Antony Contras, MD  SIGNED    To contact Stroke Continuity provider, please refer to [email or web address] After hours, contact General Neurology

## 2014-07-11 NOTE — Progress Notes (Signed)
[name] continues to improve. He is eating better. His daughter says that his appetite is improving. There's been no nausea or vomiting.  He is doing somewhat physical therapy. Hopefully physical therapy will comply again today to work with him.  He continues on vancomycin. This for the enterococcus his blood. I guess that the Enterobacter his urine was not clinically significant.  Infectious disease wants him on high-dose ampicillin gentamicin. This is for outpatient therapy for endocarditis given his pacemaker and mitral valve issues. A PICC line will need to be placed. If one is going to be placed, I would make sure a double-lumen is put in. This is so that we had the ability to be able to transfuse him with blood products, if needed, as an out patient.  His platelet count is still 8000. He received Nplate on Wednesday. He is not due for another dose until Wednesday. He is now on oral prednisone. This is for the probability of immune thrombocytopenia.  I will go ahead and give him a dose of Aranesp today. This is for the anemia. His hemoglobin is 9.4. Given his low erythropoietin level, and his mild renal insufficiency, I think that his bone marrow is going to have a tell time trying to increase blood production. I talked to him about Aranesp. I explained to him how it works. I explained to him the possible side effects of high blood pressure, stroke, cardiac events. I think all of these would be highly unlikely given his thrombocytopenia and actual hypo-tension. He understands. His daughter was with him. She also understands. I will give him one dose of 300 mcg.  On physical exam, temperature 97.4. Pulse 60. Blood pressure 130/52. Head and neck exam shows no ocular or oral lesion. There's no sinus. Lungs are with slight decrease at the bases. Cardiac exam is regular rate and rhythm. He has a 1/6 systolic murmur. Abdomen is soft. There is no palpable liver or spleen. Currently shows some slight  pitting edema in his lower legs. Skin exam shows no petechia or ecchymoses. Neurological exam is nonfocal.  We will continue to follow along. With his sepsis, his thrombocytopenia may take a little bit of time to improve. I know that cardiology want to hold off on his trans-esophageal ECHO cardiogram given his thrombocytopenia.  We will continue to follow along closely.  Loann Quill 7:7

## 2014-07-11 NOTE — Progress Notes (Signed)
[facility] PROGRESS NOTE  HARM JOU BBC:[id number] DOB: 02-Jan-1933 DOA: 07/31/2014 PCP: Penni Homans, MD  Assessment/Plan: 80 yo male with diastolic heart failure currently treated for ITP with systemic steroids and on IVIG presents to [facility] 8/3 with dyspnea and fever. Hypotensive requiring vasopressors.  Enterococcus Bacteremia: Received IV vancomycin for 4 days. Blood Culture 8-5 no growth to date.  WBC trending down. CT abdomen  no mas. -Will hold on placement of PICC line for now until infection better controlled. Will follow ID recommendation to when to order PICC line. . At time of PICC line placement will need to order double lumen per Dr Marin Olp request.  -Vancomycin change to ampicillin 8-8. Plan to start gentamycin 8-9.  -TEE when platelet improved.  -HIV, viral hepatitis pending.   Transient Hallucination 8-6: related to medications, infections, delirium. CT head was negative.   Transient hand weakness, numbness. ? TIA, Stroke. Marland Kitchen  Neurology recommending TEE to rule out endocarditis. Multiple risk factors for TIA, Stroke. Difficult situation with thrombocytopenia, high risk for bleeding.   Possible pneumonia vs TRALI from IVIG  Bilateral effusions - stable Continue with Lasix. Monitor renal function.   Diarrhea; C diff no collected.   Elevated troponin: in setting sepsis. ECHO; normal Wall motion. Cardiology to following. High risk for anticoagulation.   Enterobacter Urine: 30,000 colonies.   Chronic diastolic heart failure - on lasix at home Restart lasix (80 mg ) on 8-5.  Urine out put 1.7 L on 8-7.  Dyspnea improving.   Sepsis: enterococcus bacteremia. Received pressors (Levophed). BP stable. On antibiotics.   AKI:in setting of infection. Monitor on Lasix. Stable, cr trending down. Good urine out put.   Thrombocytopenia, ITP, first IVIG treatment 8/3, on chronic prednisone. Platelet at 8. Dr Marin Olp following. Continue with prednisone 80 mg daily. Received  one dose of Nplate.  Anemia; Epogen ordered. Urine electrophoresis ordered.   Suspected Adrenal insufficiency ( chronic steroids )  Transition to chronic dose of prednisone.   H/o PAF, now in SR   Code Status: Partial: yes to antiarrythmic and vasopressors.   Family Communication: care discussed with Daughter Disposition Plan: home when stable.    Consultants:  ID  Cardiology.  neurology   Procedures: 6/24 TTE >>> LVEF 55%, Grade 2 DD  8/3 CT chest >>> bilateral effusions, bilateral GGO, several nodular opacities  8/4 ECHO >>> Mild to mod MR, EF 50-55%, G2 Dia Dys Fcn   Antibiotics:  Vancomycin 8-3---8-8  Ampicillin.     HPI/Subjective: He is feeling stronger every day. Breathing better.  Appetite improved.   Objective: Filed Vitals:   07/11/14 0636  BP: 132/52  Pulse: 62  Temp: 97.4 F (36.3 C)  Resp: 20    Intake/Output Summary (Last 24 hours) at 07/11/14 1127 Last data filed at 07/11/14 0828  Gross per 24 hour  Intake    960 ml  Output   1325 ml  Net   -365 ml   Filed Weights   07/09/14 0541 07/10/14 0547 07/11/14 0636  Weight: 87.6 kg (193 lb 2 oz) 86.902 kg (191 lb 9.4 oz) 87.5 kg (192 lb 14.4 oz)    Exam:   General:  No distress.   Cardiovascular: S 1, S 2 IRR. JVD.   Respiratory: decrease breath sound bases, B/L crackles.   Abdomen: Bs present, soft, NT  Musculoskeletal: plus 2 edema LE.   Neuro exam ; non focal.   Data Reviewed: Basic Metabolic Panel:  Recent Labs Lab 07/15/2014 1530 07/17/2014  2315 07/07/14 0235 07/08/14 0235 07/09/14 0945 07/10/14 0410 07/11/14 0345  NA 136*  --  133* 135* 139 139 141  K 4.6  --  4.4 3.8 3.8 3.4* 3.7  CL 99  --  101 104 106 104 105  CO2 24  --  18* 18* 22 23 21   GLUCOSE 130*  --  147* 127* 141* 135* 137*  BUN 45*  --  48* 46* 43* 38* 39*  CREATININE 1.50*  --  1.57* 1.55* 1.57* 1.53* 1.43*  CALCIUM 8.9  --  7.4* 7.8* 8.4 8.2* 8.3*  MG  --  2.0  --   --   --   --   --    Liver  Function Tests:  Recent Labs Lab 07/12/2014 1530 07/09/14 0945 07/10/14 0410 07/11/14 0345  AST 77* 64* 54* 56*  ALT 39 53 49 47  ALKPHOS 106 75 64 68  BILITOT 1.7* 1.4* 1.1 0.9  PROT 7.8 6.5 5.9* 5.9*  ALBUMIN 2.6* 2.1* 2.1* 2.1*   No results found for this basename: LIPASE, AMYLASE,  in the last 168 hours No results found for this basename: AMMONIA,  in the last 168 hours CBC:  Recent Labs Lab 07/08/2014 0833  07/05/2014 1530 07/07/14 0235 07/08/14 0235 07/09/14 0945 07/10/14 0410 07/11/14 0345  WBC 14.0*  < > 13.4* 24.8* 19.7* 16.7* 10.6* 12.1*  NEUTROABS 12.5*  --  12.4*  --  18.0*  --   --   --   HGB 9.5*  < > 9.8* 8.6* 9.6* 10.8* 9.7* 9.4*  HCT 28.8*  < > 30.4* 26.1* 28.3* 32.1* 28.8* 28.7*  MCV 97  < > 96.5 94.9 93.1 93.6 93.8 97.3  PLT 10*  < > 8* 11* 8* 10* 6* 8*  < > = values in this interval not displayed. Cardiac Enzymes:  Recent Labs Lab 07/05/2014 1530 07/15/2014 2140  TROPONINI 0.68* 1.59*   BNP (last 3 results)  Recent Labs  07/13/2014 1530  PROBNP 12303.0*   CBG:  Recent Labs Lab 07/10/14 1057 07/10/14 1611 07/10/14 2103 07/11/14 0619 07/11/14 1101  GLUCAP 180* 301* 174* 122* 249*    Recent Results (from the past 240 hour(s))  CULTURE, BLOOD (ROUTINE X 2)     Status: None   Collection Time    07/15/2014  3:30 PM      Result Value Ref Range Status   Specimen Description BLOOD LEFT ARM   Final   Special Requests BOTTLES DRAWN AEROBIC AND ANAEROBIC [facility]   Final   Culture  Setup Time     Final   Value: 07/09/2014 19:23     Performed at [facility]   Culture     Final   Value: ENTEROCOCCUS SPECIES     Note: SUSCEPTIBILITIES PERFORMED ON PREVIOUS CULTURE WITHIN THE LAST 5 DAYS.     Note: Gram Stain Report Called to,Read Back By and Verified With: JAMES ARTIS 07/07/14 AT 0730 [place]     Performed at [facility]   Report Status 07/09/2014 FINAL   Final  URINE CULTURE     Status: None   Collection Time    07/25/2014  4:18 PM       Result Value Ref Range Status   Specimen Description URINE, CLEAN CATCH   Final   Special Requests NONE   Final   Culture  Setup Time     Final   Value: 07/07/2014 02:46     Performed at [facility]  Colony Count     Final   Value: 35,000 COLONIES/ML     Performed at [facility]   Culture     Final   Value: ENTEROBACTER CLOACAE     Performed at [facility]   Report Status 07/09/2014 FINAL   Final   Organism ID, Bacteria ENTEROBACTER CLOACAE   Final  CULTURE, BLOOD (ROUTINE X 2)     Status: None   Collection Time    07/17/2014  4:30 PM      Result Value Ref Range Status   Specimen Description BLOOD R ARM   Final   Special Requests BOTTLES DRAWN AEROBIC AND ANAEROBIC [facility]   Final   Culture  Setup Time     Final   Value: 07/20/2014 19:22     Performed at [facility]   Culture     Final   Value: ENTEROCOCCUS SPECIES     Note: Gram Stain Report Called to,Read Back By and Verified With: JAMES ARTIS 07/07/14 AT 0730 [place]     Performed at [facility]   Report Status 07/09/2014 FINAL   Final   Organism ID, Bacteria ENTEROCOCCUS SPECIES   Final  MRSA PCR SCREENING     Status: None   Collection Time    07/19/2014  7:52 PM      Result Value Ref Range Status   MRSA by PCR NEGATIVE  NEGATIVE Final   Comment:            The GeneXpert MRSA Assay (FDA     approved for NASAL specimens     only), is one component of a     comprehensive MRSA colonization     surveillance program. It is not     intended to diagnose MRSA     infection nor to guide or     monitor treatment for     MRSA infections.  CULTURE, BLOOD (ROUTINE X 2)     Status: None   Collection Time    07/08/14 12:00 PM      Result Value Ref Range Status   Specimen Description BLOOD RIGHT HAND   Final   Special Requests BOTTLES DRAWN AEROBIC ONLY [facility]   Final   Culture  Setup Time     Final   Value: 07/08/2014 17:14     Performed at [facility]   Culture     Final   Value:         BLOOD CULTURE RECEIVED NO GROWTH TO DATE CULTURE WILL BE HELD FOR 5 DAYS BEFORE ISSUING A FINAL NEGATIVE REPORT     Performed at [facility]   Report Status PENDING   Incomplete  CULTURE, BLOOD (ROUTINE X 2)     Status: None   Collection Time    07/08/14 12:10 PM      Result Value Ref Range Status   Specimen Description BLOOD LEFT HAND   Final   Special Requests BOTTLES DRAWN AEROBIC ONLY [facility]   Final   Culture  Setup Time     Final   Value: 07/08/2014 17:05     Performed at [facility]   Culture     Final   Value:        BLOOD CULTURE RECEIVED NO GROWTH TO DATE CULTURE WILL BE HELD FOR 5 DAYS BEFORE ISSUING A FINAL NEGATIVE REPORT     Performed at [facility]   Report Status PENDING   Incomplete  Studies: Ct Abdomen Pelvis Wo Contrast  07/09/2014   CLINICAL DATA:  Enterococcus bacteremia. Evaluate for abdominal source. Evaluate for potential tumor. Enterobacter is also present in the urine.  EXAM: CT ABDOMEN AND PELVIS WITHOUT CONTRAST  TECHNIQUE: Multidetector CT imaging of the abdomen and pelvis was performed following the standard protocol without IV contrast.  COMPARISON:  No priors.  FINDINGS: Lung Bases: Atherosclerotic calcifications in the right coronary artery. Pacemaker lead terminating in the right ventricular apex. Moderate-sized hiatal hernia. Moderate to large bilateral pleural effusions which appear to layer dependently and are associated with extensive passive atelectasis in the visualized portions of the lower lobes of the lungs bilaterally.  Abdomen/Pelvis: 9 mm low attenuation lesion in segment 2 of the liver is incompletely characterized on today's noncontrast CT examination, but is unchanged, and favored to represent a tiny cyst. No other focal hepatic lesions are identified on today's non contrast CT examination. There is a tiny calcifications in the head of the pancreas (image 31 of series 2) which appears to be immediately posterior to  the distal common bile duct, potentially related to prior episodes of pancreatitis. Other small calcifications are also noted in the head and tail of the pancreas as well. The calcification in the head of the pancreas is not favored to be within the common bile duct, as the common bile duct is normal in caliber measuring 4 mm, and there is no intrahepatic biliary ductal dilatation to suggest obstruction. The unenhanced appearance of the gallbladder, spleen and bilateral adrenal glands is unremarkable. There is bilateral perinephric stranding which is nonspecific. 1.5 cm intermediate attenuation lesion extending exophytically off the lateral aspect of the upper pole of the right kidney is incompletely characterized on today's non contrast CT examination, but is statistically likely to represent a mildly proteinaceous cyst.  Normal appendix. Trace volume of ascites. No pneumoperitoneum. No pathologic distention of small bowel. 10 mm short axis inguinal lymph nodes bilaterally are nonspecific. No other definite lymphadenopathy identified in the abdomen or pelvis on today's non contrast CT examination. Mild diffuse mesenteric edema. Prostate gland is enlarged measuring 4.9 x 6.1 cm. Urinary bladder is generally unremarkable in appearance, however, the attenuation values in the urine are rather high ranging from 45-55 HU, suggesting proteinaceous or hemorrhagic contents.  Musculoskeletal: Mild diffuse body wall edema. There are no aggressive appearing lytic or blastic lesions noted in the visualized portions of the skeleton.  IMPRESSION: 1. No definite source for bacteremia identified on today's examination. 2. Relatively high attenuation of the urine is of uncertain etiology and significance, but may suggest some proteinaceous or hemorrhagic contents. 3. Findings suggestive of anasarca, including moderate to large bilateral pleural effusions, trace volume of ascites, mild diffuse mesenteric edema and diffuse body wall  edema. 4. Prostatomegaly. 5. Additional incidental findings, as above.   Electronically Signed   By: Vinnie Langton M.D.   On: 07/09/2014 12:46   Ct Head Wo Contrast  07/09/2014   CLINICAL DATA:  Hallucinations.  Thrombocytopenia  EXAM: CT HEAD WITHOUT CONTRAST  TECHNIQUE: Contiguous axial images were obtained from the base of the skull through the vertex without intravenous contrast.  COMPARISON:  None.  FINDINGS: Mild atrophy. Negative for hemorrhage. Negative for acute infarct or mass. Calvarium intact.  IMPRESSION: No acute abnormality.   Electronically Signed   By: Franchot Gallo M.D.   On: 07/09/2014 18:47    Scheduled Meds: . sodium chloride   Intravenous Once  . ampicillin (OMNIPEN) IV  2 g Intravenous 4  times per day  . antiseptic oral rinse  7 mL Mouth Rinse BID  . atorvastatin  10 mg Oral QPM  . darbepoetin (ARANESP) injection - NON-DIALYSIS  300 mcg Subcutaneous Once  . furosemide  80 mg Oral Daily  . insulin aspart  0-9 Units Subcutaneous TID WC  . pantoprazole  40 mg Oral Daily  . predniSONE  80 mg Oral Q breakfast  . pyridOXINE  100 mg Oral QPM  . tamsulosin  0.4 mg Oral QPC supper  . vitamin E  400 Units Oral Daily   Continuous Infusions:    Principal Problem:   Enterococcal bacteremia Active Problems:   CAD (coronary artery disease)   Anemia   Thrombocytopenia   Pacemaker-[employer]   Mitral regurgitation   Sepsis   TIA (transient ischemic attack)    Time spent: 25 minutes.     Niel Hummer A  [facility] Pager [phone]. If 7PM-7AM, please contact night-coverage at [email or web address], password [facility] 07/11/2014, 11:27 AM  LOS: 5 days

## 2014-07-11 NOTE — Progress Notes (Signed)
*  PRELIMINARY RESULTS* Vascular Ultrasound Carotid Duplex (Doppler) has been completed.  Preliminary findings: Bilateral:  1-39% ICA stenosis.  Vertebral artery flow is antegrade.   Right subclavian artery demonstrates significant stenosis compared to left subclavian artery.    Landry Mellow, RDMS, RVT  07/11/2014, 2:33 PM

## 2014-07-11 NOTE — Progress Notes (Signed)
Patient Name: Justin [name] Date of Encounter: 07/11/2014     Principal Problem:   Enterococcal bacteremia Active Problems:   CAD (coronary artery disease)   Anemia   Thrombocytopenia   Pacemaker-Medtronic   Mitral regurgitation   Sepsis   TIA (transient ischemic attack)    SUBJECTIVE  Neuro seeing for probably TIA. Still with likely questions of enterococcal endocarditis. Platelet count is too low for TEE.  ?need for pacer extraction   CURRENT MEDS . sodium chloride   Intravenous Once  . ampicillin (OMNIPEN) IV  2 g Intravenous 4 times per day  . antiseptic oral rinse  7 mL Mouth Rinse BID  . atorvastatin  10 mg Oral QPM  . darbepoetin (ARANESP) injection - NON-DIALYSIS  300 mcg Subcutaneous Once  . furosemide  80 mg Oral Daily  . insulin aspart  0-9 Units Subcutaneous TID WC  . pantoprazole  40 mg Oral Daily  . predniSONE  80 mg Oral Q breakfast  . pyridOXINE  100 mg Oral QPM  . tamsulosin  0.4 mg Oral QPC supper  . vitamin E  400 Units Oral Daily    OBJECTIVE  Filed Vitals:   07/10/14 1042 07/10/14 1431 07/10/14 2056 07/11/14 0636  BP: 137/47 135/48 126/52 132/52  Pulse: 71 69 63 62  Temp: 97.5 F (36.4 C) 97.8 F (36.6 C) 97.9 F (36.6 C) 97.4 F (36.3 C)  TempSrc: Oral Oral Oral Oral  Resp: 18 20 20 20   Height:      Weight:    192 lb 14.4 oz (87.5 kg)  SpO2: 94% 100% 99% 100%    Intake/Output Summary (Last 24 hours) at 07/11/14 1031 Last data filed at 07/11/14 0828  Gross per 24 hour  Intake    960 ml  Output   1325 ml  Net   -365 ml   Filed Weights   07/09/14 0541 07/10/14 0547 07/11/14 0636  Weight: 193 lb 2 oz (87.6 kg) 191 lb 9.4 oz (86.902 kg) 192 lb 14.4 oz (87.5 kg)    PHYSICAL EXAM  General: Pleasant, NAD. Neuro: Alert and oriented X 3. Moves all extremities spontaneously. Psych: Normal affect. HEENT:  Normal  Neck: Supple without bruits or JVD. Lungs:  Resp regular and unlabored, CTA. Heart: RRR no s3, s4, or  murmurs. Abdomen: Soft, non-tender, non-distended, BS + x 4.  Extremities: No clubbing, cyanosis DP/PT/Radials 2+ and equal bilaterally. Extremity appears cold  Accessory Clinical Findings  CBC  Recent Labs  07/10/14 0410 07/11/14 0345  WBC 10.6* 12.1*  HGB 9.7* 9.4*  HCT 28.8* 28.7*  MCV 93.8 97.3  PLT 6* 8*   Basic Metabolic Panel  Recent Labs  07/10/14 0410 07/11/14 0345  NA 139 141  K 3.4* 3.7  CL 104 105  CO2 23 21  GLUCOSE 135* 137*  BUN 38* 39*  CREATININE 1.53* 1.43*  CALCIUM 8.2* 8.3*   Liver Function Tests  Recent Labs  07/10/14 0410 07/11/14 0345  AST 54* 56*  ALT 49 47  ALKPHOS 64 68  BILITOT 1.1 0.9  PROT 5.9* 5.9*  ALBUMIN 2.1* 2.1*    TELE  Atrial sensed with ventricular paced rhythm with HR 70  ECG  No EKG today  Radiology/Studies  Ct Abdomen Pelvis Wo Contrast  07/09/2014   CLINICAL DATA:  Enterococcus bacteremia. Evaluate for abdominal source. Evaluate for potential tumor. Enterobacter is also present in the urine.  EXAM: CT ABDOMEN AND PELVIS WITHOUT CONTRAST  TECHNIQUE: Multidetector CT imaging of  the abdomen and pelvis was performed following the standard protocol without IV contrast.  COMPARISON:  No priors.  FINDINGS: Lung Bases: Atherosclerotic calcifications in the right coronary artery. Pacemaker lead terminating in the right ventricular apex. Moderate-sized hiatal hernia. Moderate to large bilateral pleural effusions which appear to layer dependently and are associated with extensive passive atelectasis in the visualized portions of the lower lobes of the lungs bilaterally.  Abdomen/Pelvis: 9 mm low attenuation lesion in segment 2 of the liver is incompletely characterized on today's noncontrast CT examination, but is unchanged, and favored to represent a tiny cyst. No other focal hepatic lesions are identified on today's non contrast CT examination. There is a tiny calcifications in the head of the pancreas (image 31 of series 2)  which appears to be immediately posterior to the distal common bile duct, potentially related to prior episodes of pancreatitis. Other small calcifications are also noted in the head and tail of the pancreas as well. The calcification in the head of the pancreas is not favored to be within the common bile duct, as the common bile duct is normal in caliber measuring 4 mm, and there is no intrahepatic biliary ductal dilatation to suggest obstruction. The unenhanced appearance of the gallbladder, spleen and bilateral adrenal glands is unremarkable. There is bilateral perinephric stranding which is nonspecific. 1.5 cm intermediate attenuation lesion extending exophytically off the lateral aspect of the upper pole of the right kidney is incompletely characterized on today's non contrast CT examination, but is statistically likely to represent a mildly proteinaceous cyst.  Normal appendix. Trace volume of ascites. No pneumoperitoneum. No pathologic distention of small bowel. 10 mm short axis inguinal lymph nodes bilaterally are nonspecific. No other definite lymphadenopathy identified in the abdomen or pelvis on today's non contrast CT examination. Mild diffuse mesenteric edema. Prostate gland is enlarged measuring 4.9 x 6.1 cm. Urinary bladder is generally unremarkable in appearance, however, the attenuation values in the urine are rather high ranging from 45-55 HU, suggesting proteinaceous or hemorrhagic contents.  Musculoskeletal: Mild diffuse body wall edema. There are no aggressive appearing lytic or blastic lesions noted in the visualized portions of the skeleton.  IMPRESSION: 1. No definite source for bacteremia identified on today's examination. 2. Relatively high attenuation of the urine is of uncertain etiology and significance, but may suggest some proteinaceous or hemorrhagic contents. 3. Findings suggestive of anasarca, including moderate to large bilateral pleural effusions, trace volume of ascites, mild  diffuse mesenteric edema and diffuse body wall edema. 4. Prostatomegaly. 5. Additional incidental findings, as above.   Electronically Signed   By: Vinnie Langton M.D.   On: 07/09/2014 12:46   Dg Chest 2 View  07/17/2014   CLINICAL DATA:  Fever, chills, shortness of breath.  EXAM: CHEST  2 VIEW  COMPARISON:  06/10/2014  FINDINGS: Left pacer in place with leads in the right atrium and right ventricle. Heart is normal size. Mild peribronchial thickening. No confluent airspace opacities or effusions. No acute bony abnormality.  IMPRESSION: Bronchitic changes.   Electronically Signed   By: Rolm Baptise M.D.   On: 07/19/2014 15:07   Ct Head Wo Contrast  07/09/2014   CLINICAL DATA:  Hallucinations.  Thrombocytopenia  EXAM: CT HEAD WITHOUT CONTRAST  TECHNIQUE: Contiguous axial images were obtained from the base of the skull through the vertex without intravenous contrast.  COMPARISON:  None.  FINDINGS: Mild atrophy. Negative for hemorrhage. Negative for acute infarct or mass. Calvarium intact.  IMPRESSION: No acute abnormality.  Electronically Signed   By: Franchot Gallo M.D.   On: 07/09/2014 18:47   Ct Angio Chest W/cm &/or Wo Cm  07/16/2014   CLINICAL DATA:  Shortness of breath and fever  EXAM: CT ANGIOGRAPHY CHEST WITH CONTRAST  TECHNIQUE: Multidetector CT imaging of the chest was performed using the standard protocol during bolus administration of intravenous contrast. Multiplanar CT image reconstructions and MIPs were obtained to evaluate the vascular anatomy.  CONTRAST:  39mL OMNIPAQUE IOHEXOL 350 MG/ML SOLN  COMPARISON:  Chest radiograph July 06, 2014  FINDINGS: There is no demonstrable pulmonary embolus. There is no thoracic aortic aneurysm or dissection.  There are free-flowing pleural effusions bilaterally. There is hazy ground-glass opacity diffusely.  On axial CT slice 55 series 6, there is a 6 x 4 mm nodular opacity in the medial segment of the right middle lobe. On axial slice 64 series 6, there is  a 6 x 4 mm nodular opacity in the lateral segment of the left lower lobe. On axial slice 62 series 6, there is a 7 x 5 mm nodular opacity in the lateral segment of the right middle lobe. On axial slice 69 series 6, there is a 5 x 4 mm nodular opacity in the superior segment of the right lower lobe.  There is no appreciable thoracic adenopathy. Pericardium is not thickened. Pacemaker leads are attached to the right atrium and right ventricle. There is a focal hiatal hernia.  Visualized upper abdominal structures appear unremarkable. There are no blastic or lytic bone lesions. There is degenerative change in the thoracic spine. Thyroid appears unremarkable.  Review of the MIP images confirms the above findings.  IMPRESSION: No demonstrable pulmonary embolus.  Bilateral effusions with extensive ground-glass opacity bilaterally. Suspect alveolar edema with findings consistent with congestive heart failure. Atypical infection or allergic type phenomenon are differential considerations, however.  Several nodular opacities, largest measuring 7 mm. Followup of these nodular opacity should be based on Fleischner Society guidelines. If the patient is at high risk for bronchogenic carcinoma, follow-up chest CT at 3-53months is recommended. If the patient is at low risk for bronchogenic carcinoma, follow-up chest CT at 6-12 months is recommended. This recommendation follows the consensus statement: Guidelines for Management of Small Pulmonary Nodules Detected on CT Scans: A Statement from the [place] as published in Radiology 2005; 237:395-400.  Focal hiatal hernia.  No adenopathy.   Electronically Signed   By: Lowella Grip M.D.   On: 07/12/2014 18:09   Dg Chest Port 1 View  07/07/2014   CLINICAL DATA:  Evaluate airspace.  EXAM: PORTABLE CHEST - 1 VIEW  COMPARISON:  CT 07/23/2014.  Chest x-ray 07/13/2014 and 06/10/2014.  FINDINGS: Cardiac pacer with lead tips in right atrium and right ventricle. Mediastinum and  hilar structures are unremarkable. Cardiomegaly with mild pulmonary vascular prominence and interstitial prominence consistent with congestive heart failure. Small pleural effusions. No pneumothorax. Reference is made to recent chest CT which demonstrated small pulmonary nodules. These are not definitely identified by chest x-ray. No acute bony abnormality.  IMPRESSION: 1. Persistent changes of congestive heart failure pulmonary edema. Small pleural effusions. 2. Reference is made to recent chest CT which demonstrated small pulmonary nodules. Follow-up is recommended as on prior CT report.   Electronically Signed   By: Marcello Moores  Register   On: 07/07/2014 07:26    ASSESSMENT AND PLAN  1. Enterococcal bacteremia   - Would require platelet count >50K for TEE and/or pacer extraction.   - For now,  would continue antibiotics  2. Worsening MR on recent echo - ?if related to endocarditis  3. CAD (coronary artery disease)   - h/o LAD infarct and PACI in PCI in 03/1998  - Nuc 03/2013 no scar or ischemia  4. Anemia   - previous EKG and colonoscopy unrevealing  - s/p 1 unit PRBC on 8/4  5. Severe Thrombocytopenia   - recently worked up for ITP  - Dr. Marin Olp seeing, receiving nPlate  6. S/p dual chamber pacemaker-Medtronic   - Discussed with Dr. Lovena Le - lead extraction cannot be performed with low platelet count and not until   definitive TEE has been performed.  7. Acute on chronic diastolic CHF  - stable on lasix 80 mg daily  8. Delirium  - negative CT of head w/o contrast 8/6  - neuro consulted by primary team  9. Chronic renal insufficiency  - creatinine is slowly coming down to baseline  Pixie Casino, MD, [facility] Attending Cardiologist [facility]   07/11/2014 10:31 AM

## 2014-07-12 DIAGNOSIS — N179 Acute kidney failure, unspecified: Secondary | ICD-10-CM

## 2014-07-12 LAB — COMPREHENSIVE METABOLIC PANEL
ALBUMIN: 2.2 g/dL — AB (ref 3.5–5.2)
ALT: 53 U/L (ref 0–53)
AST: 69 U/L — AB (ref 0–37)
Alkaline Phosphatase: 56 U/L (ref 39–117)
Anion gap: 11 (ref 5–15)
BILIRUBIN TOTAL: 0.8 mg/dL (ref 0.3–1.2)
BUN: 38 mg/dL — AB (ref 6–23)
CO2: 25 mEq/L (ref 19–32)
Calcium: 8.4 mg/dL (ref 8.4–10.5)
Chloride: 105 mEq/L (ref 96–112)
Creatinine, Ser: 1.56 mg/dL — ABNORMAL HIGH (ref 0.50–1.35)
GFR calc Af Amer: 47 mL/min — ABNORMAL LOW (ref >90)
GFR calc non Af Amer: 41 mL/min — ABNORMAL LOW (ref >90)
Glucose, Bld: 109 mg/dL — ABNORMAL HIGH (ref 70–99)
Potassium: 3.6 mEq/L — ABNORMAL LOW (ref 3.7–5.3)
SODIUM: 141 meq/L (ref 137–147)
TOTAL PROTEIN: 5.8 g/dL — AB (ref 6.0–8.3)

## 2014-07-12 LAB — GLUCOSE, CAPILLARY
GLUCOSE-CAPILLARY: 100 mg/dL — AB (ref 70–99)
Glucose-Capillary: 204 mg/dL — ABNORMAL HIGH (ref 70–99)
Glucose-Capillary: 228 mg/dL — ABNORMAL HIGH (ref 70–99)

## 2014-07-12 LAB — HEPATITIS PANEL, ACUTE
HCV AB: NEGATIVE
HEP B S AG: NEGATIVE
Hep A IgM: BORDERLINE
Hep B C IgM: NONREACTIVE

## 2014-07-12 LAB — CBC
HCT: 28.3 % — ABNORMAL LOW (ref 39.0–52.0)
Hemoglobin: 9.4 g/dL — ABNORMAL LOW (ref 13.0–17.0)
MCH: 32.3 pg (ref 26.0–34.0)
MCHC: 33.2 g/dL (ref 30.0–36.0)
MCV: 97.3 fL (ref 78.0–100.0)
Platelets: 10 10*3/uL — CL (ref 150–400)
RBC: 2.91 MIL/uL — ABNORMAL LOW (ref 4.22–5.81)
RDW: 19.6 % — AB (ref 11.5–15.5)
WBC: 12 10*3/uL — AB (ref 4.0–10.5)

## 2014-07-12 LAB — LACTATE DEHYDROGENASE: LDH: 585 U/L — ABNORMAL HIGH (ref 94–250)

## 2014-07-12 LAB — HEMOGLOBIN A1C
HEMOGLOBIN A1C: 5.6 % (ref <5.7)
Mean Plasma Glucose: 114 mg/dL (ref <117)

## 2014-07-12 LAB — HIV ANTIBODY (ROUTINE TESTING W REFLEX): HIV: NONREACTIVE

## 2014-07-12 LAB — RETICULOCYTES
RBC.: 2.91 MIL/uL — AB (ref 4.22–5.81)
Retic Count, Absolute: 244.4 10*3/uL — ABNORMAL HIGH (ref 19.0–186.0)
Retic Ct Pct: 8.4 % — ABNORMAL HIGH (ref 0.4–3.1)

## 2014-07-12 LAB — SAVE SMEAR

## 2014-07-12 MED ORDER — POTASSIUM CHLORIDE CRYS ER 20 MEQ PO TBCR: 40.0000 meq | EXTENDED_RELEASE_TABLET | Freq: Once | ORAL | Status: DC

## 2014-07-12 MED ORDER — POTASSIUM CHLORIDE CRYS ER 20 MEQ PO TBCR
20.0000 meq | EXTENDED_RELEASE_TABLET | Freq: Once | ORAL | Status: AC
  Administered 2014-07-12: 20 meq via ORAL
  Filled 2014-07-12: qty 1

## 2014-07-12 MED ORDER — GENTAMICIN IN SALINE 1.6-0.9 MG/ML-% IV SOLN
80.0000 mg | INTRAVENOUS | Status: DC
  Administered 2014-07-12 – 2014-07-15 (×4): 80 mg via INTRAVENOUS
  Filled 2014-07-12 (×5): qty 50

## 2014-07-12 NOTE — Progress Notes (Signed)
[facility] PROGRESS NOTE  KAINOA SWOBODA VEH:[id number] DOB: 13-Jul-1933 DOA: 07/11/2014 PCP: Penni Homans, MD  Assessment/Plan: 80 yo male with diastolic heart failure currently treated for ITP with systemic steroids and on IVIG presents to [facility] 8/3 with dyspnea and fever. Hypotensive requiring vasopressors.  Enterococcus Bacteremia: Received IV vancomycin for 4 days. Blood Culture 8-5 no growth to date.  WBC trending down. CT abdomen  no mas. -Will hold on placement of PICC line for now until infection better controlled. Will follow ID recommendation to when to order PICC line. . At time of PICC line placement will need to order double lumen per Dr Marin Olp request.  -Vancomycin change to ampicillin 8-8.Start gentamycin 8-9.  -Plan for TEE early this week. Need platelet transfusion prior to TEE. This will need to be arrange with Dr Marin Olp.  -HIV, viral hepatitis pending.  -Day 6 antibiotics.   Transient Hallucination 8-6: Resolved. related to medications, infections, delirium. CT head was negative.   Transient hand weakness, numbness. ? TIA, Stroke. Marland Kitchen  Neurology recommending TEE to rule out endocarditis. Multiple risk factors for TIA, Stroke. Difficult situation with thrombocytopenia, high risk for bleeding.  -Carotid Duplex (Doppler) has been completed. Preliminary findings: Bilateral: 1-39% ICA stenosis. Vertebral artery flow is antegrade. Right subclavian artery demonstrates significant stenosis compared to left subclavian artery. -Hb-A1 C: pending.  -Continue with Lipitor.   Possible pneumonia vs TRALI from IVIG  Bilateral effusions - stable Continue with Lasix. Monitor renal function.   Diarrhea; resolved. Now constipation.   Elevated troponin: in setting sepsis. ECHO; normal Wall motion. Cardiology to following. High risk for anticoagulation.   Enterobacter Urine: 30,000 colonies.   Chronic diastolic heart failure - on lasix at home Restarted lasix (80 mg ) on 8-5.   Urine out put 2.3 L on 8-9.  Dyspnea improving.   Sepsis: enterococcus bacteremia. Received pressors (Levophed). BP stable. On antibiotics.   AKI:in setting of infection. Monitor on Lasix. Stable, cr trending down. Good urine out put.   Thrombocytopenia, ITP, first IVIG treatment 8/3, on chronic prednisone. Platelet at 8. Dr Marin Olp following. Continue with prednisone 80 mg daily. Received one dose of Nplate.  Anemia; received Epogen 8-8. Urine electrophoresis ordered.   Suspected Adrenal insufficiency ( chronic steroids )  Transition to chronic dose of prednisone.   H/o PAF, now in SR   Code Status: Partial: yes to antiarrythmic and vasopressors.   Family Communication: care discussed with Daughter Disposition Plan: home when stable.    Consultants:  ID  Cardiology.  neurology   Procedures: 6/24 TTE >>> LVEF 55%, Grade 2 DD  8/3 CT chest >>> bilateral effusions, bilateral GGO, several nodular opacities  8/4 ECHO >>> Mild to mod MR, EF 50-55%, G2 Dia Dys Fcn   Antibiotics:  Vancomycin 8-3---8-8  Ampicillin.     HPI/Subjective: His only complaint today is about how often he was wake up last night for vitals, for CBG check.  He is feeling better, no dyspnea.  Diarrhea has resolved. Last BM was coffee ground. His Hb is stable at 9.  No BM last 2 days.  He was not able to tolerates SCD overnight.   Objective: Filed Vitals:   07/12/14 0436  BP: 152/55  Pulse: 74  Temp: 98.7 F (37.1 C)  Resp: 19    Intake/Output Summary (Last 24 hours) at 07/12/14 1401 Last data filed at 07/12/14 1347  Gross per 24 hour  Intake   1110 ml  Output   2350 ml  Net  -1240 ml   Filed Weights   07/10/14 0547 07/11/14 0636 07/12/14 0436  Weight: 86.902 kg (191 lb 9.4 oz) 87.5 kg (192 lb 14.4 oz) 87.4 kg (192 lb 10.9 oz)    Exam:   General:  No distress.   Cardiovascular: S 1, S 2 IRR. JVD.   Respiratory: decrease breath sound bases, B/L crackles.   Abdomen: Bs  present, soft, NT  Musculoskeletal: plus 2 edema LE.   Neuro exam ; non focal.   Data Reviewed: Basic Metabolic Panel:  Recent Labs Lab 07/24/2014 1530 07/19/2014 2315  07/08/14 0235 07/09/14 0945 07/10/14 0410 07/11/14 0345 07/12/14 0430  NA 136*  --   < > 135* 139 139 141 141  K 4.6  --   < > 3.8 3.8 3.4* 3.7 3.6*  CL 99  --   < > 104 106 104 105 105  CO2 24  --   < > 18* 22 23 21 25   GLUCOSE 130*  --   < > 127* 141* 135* 137* 109*  BUN 45*  --   < > 46* 43* 38* 39* 38*  CREATININE 1.50*  --   < > 1.55* 1.57* 1.53* 1.43* 1.56*  CALCIUM 8.9  --   < > 7.8* 8.4 8.2* 8.3* 8.4  MG  --  2.0  --   --   --   --   --   --   < > = values in this interval not displayed. Liver Function Tests:  Recent Labs Lab 07/20/2014 1530 07/09/14 0945 07/10/14 0410 07/11/14 0345 07/12/14 0430  AST 77* 64* 54* 56* 69*  ALT 39 53 49 47 53  ALKPHOS 106 75 64 68 56  BILITOT 1.7* 1.4* 1.1 0.9 0.8  PROT 7.8 6.5 5.9* 5.9* 5.8*  ALBUMIN 2.6* 2.1* 2.1* 2.1* 2.2*   No results found for this basename: LIPASE, AMYLASE,  in the last 168 hours No results found for this basename: AMMONIA,  in the last 168 hours CBC:  Recent Labs Lab 07/12/2014 0833 07/10/2014 1530  07/08/14 0235 07/09/14 0945 07/10/14 0410 07/11/14 0345 07/12/14 0430  WBC 14.0* 13.4*  < > 19.7* 16.7* 10.6* 12.1* 12.0*  NEUTROABS 12.5* 12.4*  --  18.0*  --   --   --   --   HGB 9.5* 9.8*  < > 9.6* 10.8* 9.7* 9.4* 9.4*  HCT 28.8* 30.4*  < > 28.3* 32.1* 28.8* 28.7* 28.3*  MCV 97 96.5  < > 93.1 93.6 93.8 97.3 97.3  PLT 10* 8*  < > 8* 10* 6* 8* 10*  < > = values in this interval not displayed. Cardiac Enzymes:  Recent Labs Lab 07/08/2014 1530 07/30/2014 2140  TROPONINI 0.68* 1.59*   BNP (last 3 results)  Recent Labs  07/31/2014 1530  PROBNP 12303.0*   CBG:  Recent Labs Lab 07/11/14 1101 07/11/14 1612 07/11/14 2102 07/12/14 0621 07/12/14 1120  GLUCAP 249* 157* 203* 100* 204*    Recent Results (from the past 240 hour(s))   CULTURE, BLOOD (ROUTINE X 2)     Status: None   Collection Time    07/15/2014  3:30 PM      Result Value Ref Range Status   Specimen Description BLOOD LEFT ARM   Final   Special Requests BOTTLES DRAWN AEROBIC AND ANAEROBIC [facility]   Final   Culture  Setup Time     Final   Value: 07/26/2014 19:23     Performed at [facility]  Culture     Final   Value: ENTEROCOCCUS SPECIES     Note: SUSCEPTIBILITIES PERFORMED ON PREVIOUS CULTURE WITHIN THE LAST 5 DAYS.     Note: Gram Stain Report Called to,Read Back By and Verified With: JAMES ARTIS 07/07/14 AT 0730 RIDK     Performed at [facility]   Report Status 07/09/2014 FINAL   Final  URINE CULTURE     Status: None   Collection Time    07/30/2014  4:18 PM      Result Value Ref Range Status   Specimen Description URINE, CLEAN CATCH   Final   Special Requests NONE   Final   Culture  Setup Time     Final   Value: 07/07/2014 02:46     Performed at [place]     Final   Value: 35,000 COLONIES/ML     Performed at [facility]   Culture     Final   Value: ENTEROBACTER CLOACAE     Performed at [facility]   Report Status 07/09/2014 FINAL   Final   Organism ID, Bacteria ENTEROBACTER CLOACAE   Final  CULTURE, BLOOD (ROUTINE X 2)     Status: None   Collection Time    08/03/2014  4:30 PM      Result Value Ref Range Status   Specimen Description BLOOD R ARM   Final   Special Requests BOTTLES DRAWN AEROBIC AND ANAEROBIC [place]   Final   Culture  Setup Time     Final   Value: 07/05/2014 19:22     Performed at [facility]   Culture     Final   Value: ENTEROCOCCUS SPECIES     Note: Gram Stain Report Called to,Read Back By and Verified With: JAMES ARTIS 07/07/14 AT 0730 RIDK     Performed at [facility]   Report Status 07/09/2014 FINAL   Final   Organism ID, Bacteria ENTEROCOCCUS SPECIES   Final  MRSA PCR SCREENING     Status: None   Collection Time    07/04/2014  7:52 PM       Result Value Ref Range Status   MRSA by PCR NEGATIVE  NEGATIVE Final   Comment:            The GeneXpert MRSA Assay (FDA     approved for NASAL specimens     only), is one component of a     comprehensive MRSA colonization     surveillance program. It is not     intended to diagnose MRSA     infection nor to guide or     monitor treatment for     MRSA infections.  CULTURE, BLOOD (ROUTINE X 2)     Status: None   Collection Time    07/08/14 12:00 PM      Result Value Ref Range Status   Specimen Description BLOOD RIGHT HAND   Final   Special Requests BOTTLES DRAWN AEROBIC ONLY [facility]   Final   Culture  Setup Time     Final   Value: 07/08/2014 17:14     Performed at [facility]   Culture     Final   Value:        BLOOD CULTURE RECEIVED NO GROWTH TO DATE CULTURE WILL BE HELD FOR 5 DAYS BEFORE ISSUING A FINAL NEGATIVE REPORT     Performed at [facility]   Report Status  PENDING   Incomplete  CULTURE, BLOOD (ROUTINE X 2)     Status: None   Collection Time    07/08/14 12:10 PM      Result Value Ref Range Status   Specimen Description BLOOD LEFT HAND   Final   Special Requests BOTTLES DRAWN AEROBIC ONLY [facility]   Final   Culture  Setup Time     Final   Value: 07/08/2014 17:05     Performed at [facility]   Culture     Final   Value:        BLOOD CULTURE RECEIVED NO GROWTH TO DATE CULTURE WILL BE HELD FOR 5 DAYS BEFORE ISSUING A FINAL NEGATIVE REPORT     Performed at [facility]   Report Status PENDING   Incomplete     Studies: No results found.  Scheduled Meds: . sodium chloride   Intravenous Once  . ampicillin (OMNIPEN) IV  2 g Intravenous 4 times per day  . antiseptic oral rinse  7 mL Mouth Rinse BID  . atorvastatin  10 mg Oral QPM  . furosemide  80 mg Oral Daily  . gentamicin  80 mg Intravenous Q24H  . insulin aspart  0-9 Units Subcutaneous TID WC  . pantoprazole  40 mg Oral Daily  . potassium chloride  20 mEq Oral Once  . predniSONE  80 mg  Oral Q breakfast  . pyridOXINE  100 mg Oral QPM  . tamsulosin  0.4 mg Oral QPC supper  . vitamin E  400 Units Oral Daily   Continuous Infusions:    Principal Problem:   Enterococcal bacteremia Active Problems:   CAD (coronary artery disease)   Anemia   Thrombocytopenia   Pacemaker-Medtronic   Mitral regurgitation   Sepsis   TIA (transient ischemic attack)    Time spent: 25 minutes.     Niel Hummer A  [facility] Pager [phone]. If 7PM-7AM, please contact night-coverage at [email or web address], password [facility] 07/12/2014, 2:01 PM  LOS: 6 days

## 2014-07-12 NOTE — Progress Notes (Signed)
I have noted the concerns from the consultants. There clearly is a concern about enterococcus seeing the mitral valve or and the pacemaker leads. It seems as if the a pacemaker is going to have to come out.  He has remained afebrile. I think recent cultures have been negative. He currently is on ampicillin. I think gentamicin is going to be added.  If he needs any procedure done (TEE or pacemaker removal), I really don't think the thrombocytopenia is going to be an issue. We can always give him platelets before the procedure and they will last and provide adequate hemostasis.  His platelet are functional. Even though he is thrombocytopenia, his platelets are young and typically are very "sticky".  His platelet count is 10,000 today. His hemoglobin is 9.4. I did give him a dose of Aranesp yesterday.  He is on prednisone 80 mg a day.  He is doing a 24-hour urine collection.  His white cell count is holding pretty steady. He is having a reticulocytosis now. Hopefully, this might be from the iron that he got 10 days ago. Hopefully, with he and her last, his hemoglobin will start going up although but.  We can give him another dose of Nplate next week.  He's too well. He's not had any more physical therapy. I really think he needs physical therapy.  His renal function is about the same. His creatinine is 1.56.  On physical exam, his vital signs are temperature 98.7. Pulse 74. Blood pressure 152/55. Oxygen saturation is 94%. Head and neck exam shows no ocular or oral lesions. He does no palpable cervical or supraclavicular lymph nodes. Lungs are clear. I do not hear any rales wheezes or rhonchi. Cardiac exam is regular in rhythm. He has a 1/6 systolic murmur. Abdomen is soft. Good bowel sounds. There is no fluid wave there is no palpable liver or spleen tip. Extremities shows no clubbing cyanosis or edema.  Again, it sounds like the pacemaker will have to come out. All we have to do is just let me  know when this will happen and we can have platelets administered right before the procedure.  The same goes for him having a TEE. If cardiology wants him to have platelets, just 11 no and we can get these in Mr. right before the procedure.  Please call please make sure physical therapy is seeing him so that he can continue to strengthen.   Pete E.  Rodman Key 18:20

## 2014-07-12 NOTE — Progress Notes (Addendum)
ANTIBIOTIC CONSULT NOTE - FOLLOW UP  Pharmacy Consult for gentamicin Indication: bactermia with possible endocarditis  No Known Allergies  Patient Measurements: Height: 5\' 11"  (180.3 cm) Weight: 192 lb 10.9 oz (87.4 kg) (scale a) IBW/kg (Calculated) : 75.3  Vital Signs: Temp: 98.7 F (37.1 C) (08/09 0436) Temp src: Oral (08/09 0436) BP: 152/55 mmHg (08/09 0436) Pulse Rate: 74 (08/09 0436) Intake/Output from previous day: 08/08 0701 - 08/09 0700 In: 920 [P.O.:720; IV Piggyback:200] Out: 2350 [Urine:2350] Intake/Output from this shift: Total I/O In: 480 [P.O.:480] Out: 150 [Urine:150]  Labs:  Recent Labs  07/10/14 0410 07/11/14 0345 07/12/14 0430  WBC 10.6* 12.1* 12.0*  HGB 9.7* 9.4* 9.4*  PLT 6* 8* 10*  CREATININE 1.53* 1.43* 1.56*   Estimated Creatinine Clearance: 40.9 ml/min (by C-G formula based on Cr of 1.56). No results found for this basename: VANCOTROUGH, Corlis Leak, VANCORANDOM, GENTTROUGH, GENTPEAK, GENTRANDOM, TOBRATROUGH, TOBRAPEAK, TOBRARND, AMIKACINPEAK, AMIKACINTROU, AMIKACIN,  in the last 72 hours   Microbiology: Recent Results (from the past 720 hour(s))  CULTURE, BLOOD (ROUTINE X 2)     Status: None   Collection Time    07/28/2014  3:30 PM      Result Value Ref Range Status   Specimen Description BLOOD LEFT ARM   Final   Special Requests BOTTLES DRAWN AEROBIC AND ANAEROBIC [facility]   Final   Culture  Setup Time     Final   Value: 07/24/2014 19:23     Performed at [facility]   Culture     Final   Value: ENTEROCOCCUS SPECIES     Note: SUSCEPTIBILITIES PERFORMED ON PREVIOUS CULTURE WITHIN THE LAST 5 DAYS.     Note: Gram Stain Report Called to,Read Back By and Verified With: JAMES ARTIS 07/07/14 AT 0730 RIDK     Performed at [facility]   Report Status 07/09/2014 FINAL   Final  URINE CULTURE     Status: None   Collection Time    07/07/2014  4:18 PM      Result Value Ref Range Status   Specimen Description URINE, CLEAN CATCH   Final   Special Requests NONE   Final   Culture  Setup Time     Final   Value: 07/07/2014 02:46     Performed at [place]     Final   Value: 35,000 COLONIES/ML     Performed at [facility]   Culture     Final   Value: ENTEROBACTER CLOACAE     Performed at [facility]   Report Status 07/09/2014 FINAL   Final   Organism ID, Bacteria ENTEROBACTER CLOACAE   Final  CULTURE, BLOOD (ROUTINE X 2)     Status: None   Collection Time    07/16/2014  4:30 PM      Result Value Ref Range Status   Specimen Description BLOOD R ARM   Final   Special Requests BOTTLES DRAWN AEROBIC AND ANAEROBIC [place]   Final   Culture  Setup Time     Final   Value: 07/27/2014 19:22     Performed at [facility]   Culture     Final   Value: ENTEROCOCCUS SPECIES     Note: Gram Stain Report Called to,Read Back By and Verified With: JAMES ARTIS 07/07/14 AT 0730 [place]     Performed at [facility]   Report Status 07/09/2014 FINAL   Final   Organism ID, Bacteria  ENTEROCOCCUS SPECIES   Final  MRSA PCR SCREENING     Status: None   Collection Time    07/05/2014  7:52 PM      Result Value Ref Range Status   MRSA by PCR NEGATIVE  NEGATIVE Final   Comment:            The GeneXpert MRSA Assay (FDA     approved for NASAL specimens     only), is one component of a     comprehensive MRSA colonization     surveillance program. It is not     intended to diagnose MRSA     infection nor to guide or     monitor treatment for     MRSA infections.  CULTURE, BLOOD (ROUTINE X 2)     Status: None   Collection Time    07/08/14 12:00 PM      Result Value Ref Range Status   Specimen Description BLOOD RIGHT HAND   Final   Special Requests BOTTLES DRAWN AEROBIC ONLY 6CC   Final   Culture  Setup Time     Final   Value: 07/08/2014 17:14     Performed at [facility]   Culture     Final   Value:        BLOOD CULTURE RECEIVED NO GROWTH TO DATE CULTURE WILL BE HELD FOR 5 DAYS  BEFORE ISSUING A FINAL NEGATIVE REPORT     Performed at [facility]   Report Status PENDING   Incomplete  CULTURE, BLOOD (ROUTINE X 2)     Status: None   Collection Time    07/08/14 12:10 PM      Result Value Ref Range Status   Specimen Description BLOOD LEFT HAND   Final   Special Requests BOTTLES DRAWN AEROBIC ONLY 6CC   Final   Culture  Setup Time     Final   Value: 07/08/2014 17:05     Performed at [facility]   Culture     Final   Value:        BLOOD CULTURE RECEIVED NO GROWTH TO DATE CULTURE WILL BE HELD FOR 5 DAYS BEFORE ISSUING A FINAL NEGATIVE REPORT     Performed at [facility]   Report Status PENDING   Incomplete    Anti-infectives   Start     Dose/Rate Route Frequency Ordered Stop   07/11/14 1200  ampicillin (OMNIPEN) 2 g in sodium chloride 0.9 % 50 mL IVPB     2 g 150 mL/hr over 20 Minutes Intravenous 4 times per day 07/11/14 0945     07/07/14 0030  piperacillin-tazobactam (ZOSYN) IVPB 3.375 g  Status:  Discontinued     3.375 g 12.5 mL/hr over 240 Minutes Intravenous Every 8 hours 07/20/2014 2042 07/08/14 1602   07/05/2014 2300  levofloxacin (LEVAQUIN) IVPB 750 mg  Status:  Discontinued     750 mg 100 mL/hr over 90 Minutes Intravenous Every 48 hours 07/25/2014 2232 07/08/14 1022   07/07/2014 1615  piperacillin-tazobactam (ZOSYN) IVPB 3.375 g     3.375 g 100 mL/hr over 30 Minutes Intravenous  Once 07/19/2014 1604 07/19/2014 1755   07/31/2014 1615  vancomycin (VANCOCIN) IVPB 1000 mg/200 mL premix     1,000 mg 200 mL/hr over 60 Minutes Intravenous  Once 07/14/2014 1604 07/24/2014 1930   07/26/2014 0030  vancomycin (VANCOCIN) IVPB 750 mg/150 ml premix  Status:  Discontinued     750 mg 150 mL/hr over 60  Minutes Intravenous Every 12 hours 07/15/2014 2042 07/11/14 0945      Assessment: 80 yo male with 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 He is an amipicillin and pharmacy has been asked to dose gentamicin.  WBC= 12, afebrile, SCr= 1.56  (range 1.43-1.57 in the last 6 days) and CrCl ~ 40. Patient noted for TEE.  Levaquin 8/3 >>8/5 Vanc 8/3 >>8/8 Zosyn 8/3 >> 8/5 Ampicillin 8/8>>   8/3 Bld >> 2/2 Enterococcus (sens amp) 8/3 Urine >> 35K enterobacter (resis ancef) 8/5 Repeat Bld >> 2/2 ngtd  Goal of Therapy:  Gentamicin pk: 2-4 Gentamycin trough: <1  Plan:  -Gentamicin 80mg  IV q24h -Will follow renal cunction, gentamicin levels as needed  Hildred Laser, Pharm D 07/12/2014 11:43 AM

## 2014-07-12 NOTE — Progress Notes (Addendum)
Patient Name: Justin [name] Date of Encounter: 07/12/2014     Principal Problem:   Enterococcal bacteremia Active Problems:   CAD (coronary artery disease)   Anemia   Thrombocytopenia   Pacemaker-Medtronic   Mitral regurgitation   Sepsis   TIA (transient ischemic attack)    SUBJECTIVE  Neuro seeing for probably TIA. Still with likely questions of enterococcal endocarditis. Platelet count is rising slowly, now up to 10K.  Appreciate Dr. Antonieta Pert recommendations. Understand concerns from ID regarding enterococcus and the need to remove implement.  Discussed with Dr. Lovena Le yesterday.  CURRENT MEDS . sodium chloride   Intravenous Once  . ampicillin (OMNIPEN) IV  2 g Intravenous 4 times per day  . antiseptic oral rinse  7 mL Mouth Rinse BID  . atorvastatin  10 mg Oral QPM  . furosemide  80 mg Oral Daily  . insulin aspart  0-9 Units Subcutaneous TID WC  . pantoprazole  40 mg Oral Daily  . predniSONE  80 mg Oral Q breakfast  . pyridOXINE  100 mg Oral QPM  . tamsulosin  0.4 mg Oral QPC supper  . vitamin E  400 Units Oral Daily    OBJECTIVE  Filed Vitals:   07/11/14 1347 07/11/14 2011 07/12/14 0212 07/12/14 0436  BP: 128/50 143/53 144/62 152/55  Pulse: 68 72 77 74  Temp: 97.4 F (36.3 C) 97.8 F (36.6 C) 98.5 F (36.9 C) 98.7 F (37.1 C)  TempSrc: Oral Oral Oral Oral  Resp: 20 18 19 19   Height:      Weight:    192 lb 10.9 oz (87.4 kg)  SpO2: 100% 100% 100% 94%    Intake/Output Summary (Last 24 hours) at 07/12/14 0817 Last data filed at 07/12/14 0800  Gross per 24 hour  Intake    920 ml  Output   2500 ml  Net  -1580 ml   Filed Weights   07/10/14 0547 07/11/14 0636 07/12/14 0436  Weight: 191 lb 9.4 oz (86.902 kg) 192 lb 14.4 oz (87.5 kg) 192 lb 10.9 oz (87.4 kg)    PHYSICAL EXAM  General: Pleasant, NAD. Neuro: Alert and oriented X 3. Moves all extremities spontaneously. Psych: Normal affect. HEENT:  Normal  Neck: Supple without bruits or JVD. Lungs:   Resp regular and unlabored, CTA. Heart: RRR no s3, s4, or murmurs. Abdomen: Soft, non-tender, non-distended, BS + x 4.  Extremities: No clubbing, cyanosis DP/PT/Radials 2+ and equal bilaterally.  Accessory Clinical Findings  CBC  Recent Labs  07/11/14 0345 07/12/14 0430  WBC 12.1* 12.0*  HGB 9.4* 9.4*  HCT 28.7* 28.3*  MCV 97.3 97.3  PLT 8* 10*   Basic Metabolic Panel  Recent Labs  07/11/14 0345 07/12/14 0430  NA 141 141  K 3.7 3.6*  CL 105 105  CO2 21 25  GLUCOSE 137* 109*  BUN 39* 38*  CREATININE 1.43* 1.56*  CALCIUM 8.3* 8.4   Liver Function Tests  Recent Labs  07/11/14 0345 07/12/14 0430  AST 56* 69*  ALT 47 53  ALKPHOS 68 56  BILITOT 0.9 0.8  PROT 5.9* 5.8*  ALBUMIN 2.1* 2.2*    TELE  Atrial sensed with ventricular paced rhythm with HR 70  ECG  No EKG today  Radiology/Studies  Ct Abdomen Pelvis Wo Contrast  07/09/2014   CLINICAL DATA:  Enterococcus bacteremia. Evaluate for abdominal source. Evaluate for potential tumor. Enterobacter is also present in the urine.  EXAM: CT ABDOMEN AND PELVIS WITHOUT CONTRAST  TECHNIQUE: Multidetector  CT imaging of the abdomen and pelvis was performed following the standard protocol without IV contrast.  COMPARISON:  No priors.  FINDINGS: Lung Bases: Atherosclerotic calcifications in the right coronary artery. Pacemaker lead terminating in the right ventricular apex. Moderate-sized hiatal hernia. Moderate to large bilateral pleural effusions which appear to layer dependently and are associated with extensive passive atelectasis in the visualized portions of the lower lobes of the lungs bilaterally.  Abdomen/Pelvis: 9 mm low attenuation lesion in segment 2 of the liver is incompletely characterized on today's noncontrast CT examination, but is unchanged, and favored to represent a tiny cyst. No other focal hepatic lesions are identified on today's non contrast CT examination. There is a tiny calcifications in the head of  the pancreas (image 31 of series 2) which appears to be immediately posterior to the distal common bile duct, potentially related to prior episodes of pancreatitis. Other small calcifications are also noted in the head and tail of the pancreas as well. The calcification in the head of the pancreas is not favored to be within the common bile duct, as the common bile duct is normal in caliber measuring 4 mm, and there is no intrahepatic biliary ductal dilatation to suggest obstruction. The unenhanced appearance of the gallbladder, spleen and bilateral adrenal glands is unremarkable. There is bilateral perinephric stranding which is nonspecific. 1.5 cm intermediate attenuation lesion extending exophytically off the lateral aspect of the upper pole of the right kidney is incompletely characterized on today's non contrast CT examination, but is statistically likely to represent a mildly proteinaceous cyst.  Normal appendix. Trace volume of ascites. No pneumoperitoneum. No pathologic distention of small bowel. 10 mm short axis inguinal lymph nodes bilaterally are nonspecific. No other definite lymphadenopathy identified in the abdomen or pelvis on today's non contrast CT examination. Mild diffuse mesenteric edema. Prostate gland is enlarged measuring 4.9 x 6.1 cm. Urinary bladder is generally unremarkable in appearance, however, the attenuation values in the urine are rather high ranging from 45-55 HU, suggesting proteinaceous or hemorrhagic contents.  Musculoskeletal: Mild diffuse body wall edema. There are no aggressive appearing lytic or blastic lesions noted in the visualized portions of the skeleton.  IMPRESSION: 1. No definite source for bacteremia identified on today's examination. 2. Relatively high attenuation of the urine is of uncertain etiology and significance, but may suggest some proteinaceous or hemorrhagic contents. 3. Findings suggestive of anasarca, including moderate to large bilateral pleural  effusions, trace volume of ascites, mild diffuse mesenteric edema and diffuse body wall edema. 4. Prostatomegaly. 5. Additional incidental findings, as above.   Electronically Signed   By: Vinnie Langton M.D.   On: 07/09/2014 12:46   Dg Chest 2 View  07/11/2014   CLINICAL DATA:  Fever, chills, shortness of breath.  EXAM: CHEST  2 VIEW  COMPARISON:  06/10/2014  FINDINGS: Left pacer in place with leads in the right atrium and right ventricle. Heart is normal size. Mild peribronchial thickening. No confluent airspace opacities or effusions. No acute bony abnormality.  IMPRESSION: Bronchitic changes.   Electronically Signed   By: Rolm Baptise M.D.   On: 07/16/2014 15:07   Ct Head Wo Contrast  07/09/2014   CLINICAL DATA:  Hallucinations.  Thrombocytopenia  EXAM: CT HEAD WITHOUT CONTRAST  TECHNIQUE: Contiguous axial images were obtained from the base of the skull through the vertex without intravenous contrast.  COMPARISON:  None.  FINDINGS: Mild atrophy. Negative for hemorrhage. Negative for acute infarct or mass. Calvarium intact.  IMPRESSION: No acute  abnormality.   Electronically Signed   By: Franchot Gallo M.D.   On: 07/09/2014 18:47   Ct Angio Chest W/cm &/or Wo Cm  07/18/2014   CLINICAL DATA:  Shortness of breath and fever  EXAM: CT ANGIOGRAPHY CHEST WITH CONTRAST  TECHNIQUE: Multidetector CT imaging of the chest was performed using the standard protocol during bolus administration of intravenous contrast. Multiplanar CT image reconstructions and MIPs were obtained to evaluate the vascular anatomy.  CONTRAST:  18mL OMNIPAQUE IOHEXOL 350 MG/ML SOLN  COMPARISON:  Chest radiograph July 06, 2014  FINDINGS: There is no demonstrable pulmonary embolus. There is no thoracic aortic aneurysm or dissection.  There are free-flowing pleural effusions bilaterally. There is hazy ground-glass opacity diffusely.  On axial CT slice 55 series 6, there is a 6 x 4 mm nodular opacity in the medial segment of the right middle  lobe. On axial slice 64 series 6, there is a 6 x 4 mm nodular opacity in the lateral segment of the left lower lobe. On axial slice 62 series 6, there is a 7 x 5 mm nodular opacity in the lateral segment of the right middle lobe. On axial slice 69 series 6, there is a 5 x 4 mm nodular opacity in the superior segment of the right lower lobe.  There is no appreciable thoracic adenopathy. Pericardium is not thickened. Pacemaker leads are attached to the right atrium and right ventricle. There is a focal hiatal hernia.  Visualized upper abdominal structures appear unremarkable. There are no blastic or lytic bone lesions. There is degenerative change in the thoracic spine. Thyroid appears unremarkable.  Review of the MIP images confirms the above findings.  IMPRESSION: No demonstrable pulmonary embolus.  Bilateral effusions with extensive ground-glass opacity bilaterally. Suspect alveolar edema with findings consistent with congestive heart failure. Atypical infection or allergic type phenomenon are differential considerations, however.  Several nodular opacities, largest measuring 7 mm. Followup of these nodular opacity should be based on Fleischner Society guidelines. If the patient is at high risk for bronchogenic carcinoma, follow-up chest CT at 3-53months is recommended. If the patient is at low risk for bronchogenic carcinoma, follow-up chest CT at 6-12 months is recommended. This recommendation follows the consensus statement: Guidelines for Management of Small Pulmonary Nodules Detected on CT Scans: A Statement from the [employer] as published in Radiology 2005; 237:395-400.  Focal hiatal hernia.  No adenopathy.   Electronically Signed   By: Lowella Grip M.D.   On: 07/23/2014 18:09   Dg Chest Port 1 View  07/07/2014   CLINICAL DATA:  Evaluate airspace.  EXAM: PORTABLE CHEST - 1 VIEW  COMPARISON:  CT 08/02/2014.  Chest x-ray 07/30/2014 and 06/10/2014.  FINDINGS: Cardiac pacer with lead tips in right  atrium and right ventricle. Mediastinum and hilar structures are unremarkable. Cardiomegaly with mild pulmonary vascular prominence and interstitial prominence consistent with congestive heart failure. Small pleural effusions. No pneumothorax. Reference is made to recent chest CT which demonstrated small pulmonary nodules. These are not definitely identified by chest x-ray. No acute bony abnormality.  IMPRESSION: 1. Persistent changes of congestive heart failure pulmonary edema. Small pleural effusions. 2. Reference is made to recent chest CT which demonstrated small pulmonary nodules. Follow-up is recommended as on prior CT report.   Electronically Signed   By: Marcello Moores  Register   On: 07/07/2014 07:26    ASSESSMENT AND PLAN  1. Enterococcal bacteremia   - Will require platelet infusion to support TEE - we will try to  arrange for early this week.   - For now, would continue antibiotics  2. Worsening MR on recent echo - probably related to endocarditis.  3. CAD (coronary artery disease)   - h/o LAD infarct and PACI in PCI in 03/1998  - Nuc 03/2013 no scar or ischemia  4. Anemia   - previous EKG and colonoscopy unrevealing  - s/p 1 unit PRBC on 8/4  5. Severe Thrombocytopenia   - recently worked up for ITP  - Dr. Marin Olp seeing, receiving nPlate  - platelet count is rising slowly  6. S/p dual chamber pacemaker-Medtronic   - Discussed with Dr. Lovena Le - lead extraction cannot be performed with low platelet count and not until   definitive TEE has been performed. Will try to arrange for TEE to evaluate leads and valves.  7. Acute on chronic diastolic CHF  - stable on lasix 80 mg daily  8. Delirium  - negative CT of head w/o contrast 8/6  - neuro consulted by primary team  9. Chronic renal insufficiency  - creatinine is slowly coming down to baseline  Pixie Casino, MD, [facility] Attending Cardiologist [facility]   07/12/2014 8:17 AM

## 2014-07-12 NOTE — Progress Notes (Signed)
Stroke Team Progress Note  HISTORY Justin [name] is an 80 y.o. male who was initially brought to hospital for dyspnea. He is currently being followed by Dr. Marin Olp for ITP and treated with systemic steroids and was treated with IVIG. While in hospital he was noted to have both enterococcus and Enterobacter in his urine and blood. ID has been following. No obvious source of bacteremia has been found. This AM he went to pick up the top to the dish and noted he had a hard time grasping and feeling the lid with his right hand. He states "this only lasted for a few seconds" then resolved. "If he was not in the hospital he probably would have not thought anything about it". Currently he is back to his baseline and feels fine. Cardiology was consulted for possible worsening of MR. They have reviewed the echo and feels it has not worsened. Patient was to get a TEE for possibility Of endocarditis but due to PLT of 6 this is going to be postponed.  Note --Patient has pacer and cannot obtain MRI  Patient was not administered TPA secondary to low platelets. He was admitted to the medical floor for further evaluation and treatment.  SUBJECTIVE  .  Overall he feels his condition is stable.  TEE not done due to low platelets. But planned for next Tuesday he has seen Dr Debara Pickett  OBJECTIVE Most recent Vital Signs: Filed Vitals:   07/11/14 1347 07/11/14 2011 07/12/14 0212 07/12/14 0436  BP: 128/50 143/53 144/62 152/55  Pulse: 68 72 77 74  Temp: 97.4 F (36.3 C) 97.8 F (36.6 C) 98.5 F (36.9 C) 98.7 F (37.1 C)  TempSrc: Oral Oral Oral Oral  Resp: 20 18 19 19   Height:      Weight:    87.4 kg (192 lb 10.9 oz)  SpO2: 100% 100% 100% 94%   CBG (last 3)   Recent Labs  07/11/14 2102 07/12/14 0621 07/12/14 1120  GLUCAP 203* 100* 204*    IV Fluid Intake:     MEDICATIONS  . sodium chloride   Intravenous Once  . ampicillin (OMNIPEN) IV  2 g Intravenous 4 times per day  . antiseptic oral rinse  7 mL  Mouth Rinse BID  . atorvastatin  10 mg Oral QPM  . furosemide  80 mg Oral Daily  . insulin aspart  0-9 Units Subcutaneous TID WC  . pantoprazole  40 mg Oral Daily  . predniSONE  80 mg Oral Q breakfast  . pyridOXINE  100 mg Oral QPM  . tamsulosin  0.4 mg Oral QPC supper  . vitamin E  400 Units Oral Daily   PRN:  sodium chloride, acetaminophen, polyvinyl alcohol  Diet:  Cardiac   Activity:  Bedrest  DVT Prophylaxis:  SCds CLINICALLY SIGNIFICANT STUDIES Basic Metabolic Panel:  Recent Labs Lab 07/19/2014 1530 07/28/2014 2315  07/11/14 0345 07/12/14 0430  NA 136*  --   < > 141 141  K 4.6  --   < > 3.7 3.6*  CL 99  --   < > 105 105  CO2 24  --   < > 21 25  GLUCOSE 130*  --   < > 137* 109*  BUN 45*  --   < > 39* 38*  CREATININE 1.50*  --   < > 1.43* 1.56*  CALCIUM 8.9  --   < > 8.3* 8.4  MG  --  2.0  --   --   --   < > =  values in this interval not displayed. Liver Function Tests:   Recent Labs Lab 07/11/14 0345 07/12/14 0430  AST 56* 69*  ALT 47 53  ALKPHOS 68 56  BILITOT 0.9 0.8  PROT 5.9* 5.8*  ALBUMIN 2.1* 2.2*   CBC:  Recent Labs Lab 07/18/2014 1530  07/08/14 0235  07/11/14 0345 07/12/14 0430  WBC 13.4*  < > 19.7*  < > 12.1* 12.0*  NEUTROABS 12.4*  --  18.0*  --   --   --   HGB 9.8*  < > 9.6*  < > 9.4* 9.4*  HCT 30.4*  < > 28.3*  < > 28.7* 28.3*  MCV 96.5  < > 93.1  < > 97.3 97.3  PLT 8*  < > 8*  < > 8* 10*  < > = values in this interval not displayed. Coagulation:   Recent Labs Lab 07/12/2014 2140  LABPROT 17.9*  INR 1.48   Cardiac Enzymes:   Recent Labs Lab 07/09/2014 1530 07/22/2014 2140  TROPONINI 0.68* 1.59*   Urinalysis:   Recent Labs Lab 07/07/2014 1617  COLORURINE AMBER*  LABSPEC 1.017  PHURINE 5.0  GLUCOSEU NEGATIVE  HGBUR LARGE*  BILIRUBINUR NEGATIVE  KETONESUR NEGATIVE  PROTEINUR 100*  UROBILINOGEN 1.0  NITRITE NEGATIVE  LEUKOCYTESUR MODERATE*   Lipid Panel    Component Value Date/Time   CHOL 126 01/13/2014 0849   TRIG 66  01/13/2014 0849   HDL 49 01/13/2014 0849   CHOLHDL 2.6 01/13/2014 0849   VLDL 13 01/13/2014 0849   LDLCALC 64 01/13/2014 0849   HgbA1C  Lab Results  Component Value Date   HGBA1C 5.5 04/14/2014    Urine Drug Screen:   No results found for this basename: labopia,  cocainscrnur,  labbenz,  amphetmu,  thcu,  labbarb    Alcohol Level: No results found for this basename: ETH,  in the last 168 hours  No results found.      MRI of the brain  pacer  MRA of the brain  pacer  Carotid Doppler  Preliminary findings: Bilateral: 1-39% ICA stenosis. Vertebral artery flow is antegrade.  Right subclavian artery demonstrates significant stenosis compared to left subclavian artery.      2D Echocardiogram  Systolic function was normal. The estimated ejection fraction was in the range of 50% to 55%. Wall motion was normal; there were no regional wall motion abnormalities.    CXR  07/07/2014 Persistent changes of congestive heart failure pulmonary edema.  Small pleural effusions   EKG 07/15/2014 Atrial-sensed ventricular-paced rhythm Therapy Recommendations  None as TIA  Physical Exam  pleasant elderly caucasian male not in distress.Awake alert. Afebrile. Head is nontraumatic. Neck is supple without bruit. Hearing is normal. Cardiac exam no murmur or gallop. Lungs are clear to auscultation. Distal pulses are well felt. Neurological Exam ;  Awake  Alert oriented x 3. Normal speech and language.eye movements full without nystagmus.fundi were not visualized. Vision acuity and fields appear normal. Hearing is normal. Palatal movements are normal. Face symmetric. Tongue midline. Normal strength, tone, reflexes and coordination. Normal sensation. Gait deferred. ASSESSMENT Justin [name] is a 80 y.o. male presenting with  Transient right hand weakness and numbness likely from left brain TIA.  Etiology to be determined. Possiblities include endocarditis given enterococcus bacteremia but stroke w/u  pending.  On no antiplatelets prior to admission. Now on no antiplatelet for secondary stroke prevention. Patient with resultant  No deficits.. Stroke work up underway.   Enterococcus bacteremia  LDL 64  HbA1c 5.5   Hospital day # 6  TREATMENT/PLAN  Agree to hold antiplatelts for secondary stroke prevention until platelet count greater than 50,000  TEE would be beneficial to decide on duration of antibiotics when deemed safe to be done by cardiology  May need pacer removal if endocarditis confirmed  D/W patient      Antony Contras, MD Medical Director [place] Pager: [phone] 07/12/2014 11:43 AM         To contact Stroke Continuity provider, please refer to [email or web address] After hours, contact General Neurology

## 2014-07-12 NOTE — Progress Notes (Addendum)
[facility] for Infectious Disease    Subjective: No new symptoms  Antibiotics:  Anti-infectives   Start     Dose/Rate Route Frequency Ordered Stop   07/12/14 1300  gentamicin (GARAMYCIN) IVPB 80 mg     80 mg 100 mL/hr over 30 Minutes Intravenous Every 24 hours 07/12/14 1200     07/11/14 1200  ampicillin (OMNIPEN) 2 g in sodium chloride 0.9 % 50 mL IVPB     2 g 150 mL/hr over 20 Minutes Intravenous 4 times per day 07/11/14 0945     07/07/14 0030  piperacillin-tazobactam (ZOSYN) IVPB 3.375 g  Status:  Discontinued     3.375 g 12.5 mL/hr over 240 Minutes Intravenous Every 8 hours 07/14/2014 2042 07/08/14 1602   07/07/2014 2300  levofloxacin (LEVAQUIN) IVPB 750 mg  Status:  Discontinued     750 mg 100 mL/hr over 90 Minutes Intravenous Every 48 hours 07/19/2014 2232 07/08/14 1022   07/26/2014 1615  piperacillin-tazobactam (ZOSYN) IVPB 3.375 g     3.375 g 100 mL/hr over 30 Minutes Intravenous  Once 07/19/2014 1604 07/16/2014 1755   07/16/2014 1615  vancomycin (VANCOCIN) IVPB 1000 mg/200 mL premix     1,000 mg 200 mL/hr over 60 Minutes Intravenous  Once 07/21/2014 1604 07/09/2014 1930   07/10/2014 0030  vancomycin (VANCOCIN) IVPB 750 mg/150 ml premix  Status:  Discontinued     750 mg 150 mL/hr over 60 Minutes Intravenous Every 12 hours 07/05/2014 2042 07/11/14 0945      Medications: Scheduled Meds: . sodium chloride   Intravenous Once  . ampicillin (OMNIPEN) IV  2 g Intravenous 4 times per day  . antiseptic oral rinse  7 mL Mouth Rinse BID  . atorvastatin  10 mg Oral QPM  . furosemide  80 mg Oral Daily  . gentamicin  80 mg Intravenous Q24H  . insulin aspart  0-9 Units Subcutaneous TID WC  . pantoprazole  40 mg Oral Daily  . potassium chloride  20 mEq Oral Once  . predniSONE  80 mg Oral Q breakfast  . pyridOXINE  100 mg Oral QPM  . tamsulosin  0.4 mg Oral QPC supper  . vitamin E  400 Units Oral Daily   Continuous Infusions:  PRN Meds:.sodium chloride, acetaminophen, polyvinyl  alcohol    Objective: Weight change: -3.5 oz (-0.1 kg)  Intake/Output Summary (Last 24 hours) at 07/12/14 1701 Last data filed at 07/12/14 1608  Gross per 24 hour  Intake   1110 ml  Output   2675 ml  Net  -1565 ml   Blood pressure 137/50, pulse 77, temperature 98.7 F (37.1 C), temperature source Oral, resp. rate 20, height 5\' 11"  (1.803 m), weight 192 lb 10.9 oz (87.4 kg), SpO2 98.00%. Temp:  [97.8 F (36.6 C)-98.7 F (37.1 C)] 98.7 F (37.1 C) (08/09 1413) Pulse Rate:  [72-77] 77 (08/09 1413) Resp:  [18-20] 20 (08/09 1413) BP: (137-152)/(50-62) 137/50 mmHg (08/09 1413) SpO2:  [94 %-100 %] 98 % (08/09 1413) Weight:  [192 lb 10.9 oz (87.4 kg)] 192 lb 10.9 oz (87.4 kg) (08/09 0436)  Physical Exam: General: Alert and awake, oriented x3, not in any acute distress. HEENT: anicteric sclera, pupils reactive to light and accommodation, EOMI CVS irregularly irregular rate, normal r,  no murmur rubs or gallops Chest: clear to auscultation bilaterally, no wheezing, rales or rhonchi Abdomen: soft nontender, nondistended, normal bowel sounds, Extremities: no  clubbing or edema noted bilaterally Skin: Some ecchymoses pacemaker pocket itself is not overtly  infected Neuro: nonfocal  CBC:  Recent Labs Lab 07/07/2014 1530 07/07/2014 2140  07/08/14 0235 07/09/14 0945 07/10/14 0410 07/11/14 0345 07/12/14 0430  HGB 9.8*  --   < > 9.6* 10.8* 9.7* 9.4* 9.4*  HCT 30.4*  --   < > 28.3* 32.1* 28.8* 28.7* 28.3*  PLT 8*  --   < > 8* 10* 6* 8* 10*  INR  --  1.48  --   --   --   --   --   --   < > = values in this interval not displayed.   BMET  Recent Labs  07/11/14 0345 07/12/14 0430  NA 141 141  K 3.7 3.6*  CL 105 105  CO2 21 25  GLUCOSE 137* 109*  BUN 39* 38*  CREATININE 1.43* 1.56*  CALCIUM 8.3* 8.4     Liver Panel   Recent Labs  07/11/14 0345 07/12/14 0430  PROT 5.9* 5.8*  ALBUMIN 2.1* 2.2*  AST 56* 69*  ALT 47 53  ALKPHOS 68 56  BILITOT 0.9 0.8        Sedimentation Rate No results found for this basename: ESRSEDRATE,  in the last 72 hours C-Reactive Protein No results found for this basename: CRP,  in the last 72 hours  Micro Results: Recent Results (from the past 720 hour(s))  CULTURE, BLOOD (ROUTINE X 2)     Status: None   Collection Time    07/31/2014  3:30 PM      Result Value Ref Range Status   Specimen Description BLOOD LEFT ARM   Final   Special Requests BOTTLES DRAWN AEROBIC AND ANAEROBIC [facility]   Final   Culture  Setup Time     Final   Value: 07/25/2014 19:23     Performed at [facility]   Culture     Final   Value: ENTEROCOCCUS SPECIES     Note: SUSCEPTIBILITIES PERFORMED ON PREVIOUS CULTURE WITHIN THE LAST 5 DAYS.     Note: Gram Stain Report Called to,Read Back By and Verified With: JAMES ARTIS 07/07/14 AT 0730 RIDK     Performed at [facility]   Report Status 07/09/2014 FINAL   Final  URINE CULTURE     Status: None   Collection Time    07/21/2014  4:18 PM      Result Value Ref Range Status   Specimen Description URINE, CLEAN CATCH   Final   Special Requests NONE   Final   Culture  Setup Time     Final   Value: 07/07/2014 02:46     Performed at [facility]     Final   Value: 35,000 COLONIES/ML     Performed at [facility]   Culture     Final   Value: ENTEROBACTER CLOACAE     Performed at [facility]   Report Status 07/09/2014 FINAL   Final   Organism ID, Bacteria ENTEROBACTER CLOACAE   Final  CULTURE, BLOOD (ROUTINE X 2)     Status: None   Collection Time    07/29/2014  4:30 PM      Result Value Ref Range Status   Specimen Description BLOOD R ARM   Final   Special Requests BOTTLES DRAWN AEROBIC AND ANAEROBIC [place]   Final   Culture  Setup Time     Final   Value: 07/20/2014 19:22     Performed at [facility]  Final   Value: ENTEROCOCCUS SPECIES     Note: Gram Stain Report Called to,Read Back By and Verified With: JAMES  ARTIS 07/07/14 AT 0730 RIDK     Performed at [facility]   Report Status 07/09/2014 FINAL   Final   Organism ID, Bacteria ENTEROCOCCUS SPECIES   Final  MRSA PCR SCREENING     Status: None   Collection Time    07/09/2014  7:52 PM      Result Value Ref Range Status   MRSA by PCR NEGATIVE  NEGATIVE Final   Comment:            The GeneXpert MRSA Assay (FDA     approved for NASAL specimens     only), is one component of a     comprehensive MRSA colonization     surveillance program. It is not     intended to diagnose MRSA     infection nor to guide or     monitor treatment for     MRSA infections.  CULTURE, BLOOD (ROUTINE X 2)     Status: None   Collection Time    07/08/14 12:00 PM      Result Value Ref Range Status   Specimen Description BLOOD RIGHT HAND   Final   Special Requests BOTTLES DRAWN AEROBIC ONLY [facility]   Final   Culture  Setup Time     Final   Value: 07/08/2014 17:14     Performed at [facility]   Culture     Final   Value:        BLOOD CULTURE RECEIVED NO GROWTH TO DATE CULTURE WILL BE HELD FOR 5 DAYS BEFORE ISSUING A FINAL NEGATIVE REPORT     Performed at [facility]   Report Status PENDING   Incomplete  CULTURE, BLOOD (ROUTINE X 2)     Status: None   Collection Time    07/08/14 12:10 PM      Result Value Ref Range Status   Specimen Description BLOOD LEFT HAND   Final   Special Requests BOTTLES DRAWN AEROBIC ONLY [facility]   Final   Culture  Setup Time     Final   Value: 07/08/2014 17:05     Performed at [facility]   Culture     Final   Value:        BLOOD CULTURE RECEIVED NO GROWTH TO DATE CULTURE WILL BE HELD FOR 5 DAYS BEFORE ISSUING A FINAL NEGATIVE REPORT     Performed at [facility]   Report Status PENDING   Incomplete    Studies/Results: No results found.    Assessment/Plan:  Principal Problem:   Enterococcal bacteremia Active Problems:   CAD (coronary artery disease)   Anemia   Thrombocytopenia    Pacemaker-[employer]   Mitral regurgitation   Sepsis   TIA (transient ischemic attack)    ADEN SEK is a 81 y.o. 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 I certainly can appreciate however that with his thrombocytopenia in this endeavor will not be trivial.  CONTINUE AMPICILLIN AND I AM ADDING GENTAMICIN TODAY DESPITE RENAL FXN BEING SLIGHTLY WORSE THAN YESTERDAY  If he cannot tolerate gentamicin  with ampicillin WILL CHANGE TO ampicillin with high-dose ceftriaxone.  I agree with pursuing transesophageal echocardiogram and would to consider platelet transfusion to facilitate this if necessary.  MY understanding is that Crissie Sickles for consideration of pacemaker removal after we have TEE  If he is having embolic events on antibiotics then that would be indication for heart valve replacement but he would clearly NOT be a candidate for that (perhaps if this is going on however the PM may be the source of embolization and its removal can stop further emboli on abx  I do think however we should strongly consider removal of the pacemaker. Again if the TEE can help further in the form that decision all the better.  With re to the PICC line insertion, my preference would be to withhold placing the [facility] [place] line and less as absolutely needed for access. I'm concerned about the fact that his infection is not controlled and that he could have recurrence of his bacteremia and seating of a PICC line. Therefore I would continue to hold on placement of PICC line  He made some point be prudent to get a palliative care consult to help the patient and family in balance and goals of care   #2 thrombocytpenia;  multifactorial I would think and Dr. Marin Olp has started steroids.  #3 renal failure: Creatinine is improved yesterday worse again today but I would like to now challenge him with gentamicin  Dr. Megan Salon back tomorrow.    LOS: 6 days   Alcide Evener 07/12/2014, 5:01 PM

## 2014-07-13 ENCOUNTER — Telehealth: Payer: Self-pay | Admitting: Cardiology

## 2014-07-13 ENCOUNTER — Encounter (HOSPITAL_COMMUNITY): Payer: Self-pay | Admitting: Anesthesiology

## 2014-07-13 ENCOUNTER — Other Ambulatory Visit: Payer: Medicare Other

## 2014-07-13 DIAGNOSIS — I38 Endocarditis, valve unspecified: Secondary | ICD-10-CM

## 2014-07-13 DIAGNOSIS — Z8673 Personal history of transient ischemic attack (TIA), and cerebral infarction without residual deficits: Secondary | ICD-10-CM

## 2014-07-13 DIAGNOSIS — I679 Cerebrovascular disease, unspecified: Secondary | ICD-10-CM

## 2014-07-13 LAB — HCG, SERUM, QUALITATIVE: PREG SERUM: NEGATIVE

## 2014-07-13 LAB — GLUCOSE, CAPILLARY
GLUCOSE-CAPILLARY: 201 mg/dL — AB (ref 70–99)
Glucose-Capillary: 100 mg/dL — ABNORMAL HIGH (ref 70–99)
Glucose-Capillary: 399 mg/dL — ABNORMAL HIGH (ref 70–99)
Glucose-Capillary: 200 mg/dL — ABNORMAL HIGH (ref 70–99)

## 2014-07-13 LAB — COMPREHENSIVE METABOLIC PANEL
ALBUMIN: 2.3 g/dL — AB (ref 3.5–5.2)
ALK PHOS: 61 U/L (ref 39–117)
ALT: 60 U/L — ABNORMAL HIGH (ref 0–53)
AST: 71 U/L — AB (ref 0–37)
Anion gap: 14 (ref 5–15)
BUN: 35 mg/dL — ABNORMAL HIGH (ref 6–23)
CHLORIDE: 104 meq/L (ref 96–112)
CO2: 24 meq/L (ref 19–32)
Calcium: 8.1 mg/dL — ABNORMAL LOW (ref 8.4–10.5)
Creatinine, Ser: 1.5 mg/dL — ABNORMAL HIGH (ref 0.50–1.35)
GFR calc Af Amer: 49 mL/min — ABNORMAL LOW (ref >90)
GFR calc non Af Amer: 43 mL/min — ABNORMAL LOW (ref >90)
Glucose, Bld: 109 mg/dL — ABNORMAL HIGH (ref 70–99)
Potassium: 3.5 mEq/L — ABNORMAL LOW (ref 3.7–5.3)
Sodium: 142 mEq/L (ref 137–147)
Total Bilirubin: 0.9 mg/dL (ref 0.3–1.2)
Total Protein: 6 g/dL (ref 6.0–8.3)

## 2014-07-13 LAB — CBC
HEMATOCRIT: 28.9 % — AB (ref 39.0–52.0)
Hemoglobin: 9.6 g/dL — ABNORMAL LOW (ref 13.0–17.0)
MCH: 32.5 pg (ref 26.0–34.0)
MCHC: 33.2 g/dL (ref 30.0–36.0)
MCV: 98 fL (ref 78.0–100.0)
PLATELETS: 18 10*3/uL — AB (ref 150–400)
RBC: 2.95 MIL/uL — ABNORMAL LOW (ref 4.22–5.81)
RDW: 20.7 % — AB (ref 11.5–15.5)
WBC: 11.1 10*3/uL — AB (ref 4.0–10.5)

## 2014-07-13 LAB — TYPE AND SCREEN
ABO/RH(D): A NEG
Antibody Screen: NEGATIVE

## 2014-07-13 LAB — FECAL OCCULT BLOOD, IMMUNOCHEMICAL: FECAL OCCULT BLD: NEGATIVE

## 2014-07-13 LAB — LACTATE DEHYDROGENASE: LDH: 640 U/L — AB (ref 94–250)

## 2014-07-13 MED ORDER — SODIUM CHLORIDE 0.9 % IV SOLN: Freq: Once | INTRAVENOUS | Status: DC

## 2014-07-13 MED ORDER — POTASSIUM CHLORIDE CRYS ER 20 MEQ PO TBCR
40.0000 meq | EXTENDED_RELEASE_TABLET | Freq: Once | ORAL | Status: AC
  Administered 2014-07-13: 40 meq via ORAL
  Filled 2014-07-13: qty 2

## 2014-07-13 MED ORDER — OXYMETAZOLINE HCL 0.05 % NA SOLN
2.0000 | Freq: Once | NASAL | Status: AC
  Administered 2014-07-13: 2 via NASAL
  Filled 2014-07-13: qty 15

## 2014-07-13 MED ORDER — SODIUM CHLORIDE 0.9 % IV SOLN
INTRAVENOUS | Status: DC
  Administered 2014-07-14: 05:00:00 via INTRAVENOUS

## 2014-07-13 MED ORDER — LIDOCAINE-PRILOCAINE 2.5-2.5 % EX CREA
1.0000 "application " | TOPICAL_CREAM | Freq: Once | CUTANEOUS | Status: DC
  Filled 2014-07-13: qty 5

## 2014-07-13 NOTE — Consult Note (Signed)
ELECTROPHYSIOLOGY CONSULT NOTE    Patient ID: Justin [name] MRN: [id number], DOB/AGE: Mar 29, 1933 80 y.o.  Admit date: 07/12/2014 Date of Consult: 07-13-2014  Primary Physician: Penni Homans, MD Primary Cardiologist: Stanford Breed Electrophysiologist: Caryl Comes  Reason for Consultation: bacteremia  HPI:  Justin [name] is a 80 y.o. male with a past medical history significant for CAD, hypertension, atrial fibrillation, Mobitz II heart block (s/p PPM implant 2014), thrombocytopenia.  He is followed closely by Dr Stanford Breed.  He has had progressive heart failure symptoms and plans were for outpatient TEE. On pre-procedure labs, his platelets were noted to be significantly decreased and he was referred to Dr Marin Olp for further evaluation.  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  He was seen in the oncology office on the day of admission and developed dyspnea and fever.  He was referred to [facility] for further evaluation.  Blood cultures are positive for enterococcal bacteremia.  EP has been asked to consult for pacemaker system removal.    Pacemaker was implanted 04-2013 for Mobitz II heart block. Device interrogation today demonstrates sinus rhythm with prolonged PR interval, but intrinsic conduction.  Device has been reprogrammed to MVP to allow for evaluation of underlying rhythm overnight.    Platelets remain low at 18K, WBC 11.1, creat 1.5.    Echo this admission demonstrates EF 50-55% (lower per Dr Jacalyn Lefevre review), no RWMA, moderate to severe MR, moderate TR, PA pressure 65.    He currently states that his breathing is improved.  He denies fevers or chills.  No shortness of breath.  ROS is otherwise negative except as outlined above.   Past Medical History  Diagnosis Date  . CAD (coronary artery disease) prior stenting 1999   . Dyslipidemia   . HTN (hypertension)   . WPW (Wolff-Parkinson-White syndrome) loss of preexcitation   . Atrial  fibrillation   . AV block, Mobitz II   . Presyncope   . Myocardial infarction   . Pacemaker 04/07/2013  . Arthritis   . History of chicken pox   . Anemia   . Hypernatremia 06/03/2013  . Thrombocytopenia, unspecified 06/03/2013  . Leukopenia 06/03/2013  . Obstructive sleep apnea 06/03/2013    USES CPAP  . Urinary incontinence 06/03/2013  . Hyperglycemia 12/21/2013  . Pancytopenia 06/03/2013  . Iron deficiency anemia, unspecified 07/02/2014  . Malabsorption of iron 07/02/2014  . Hypotestosteronemia 07/02/2014  . ITP (idiopathic thrombocytopenic purpura) 07/02/2014     Surgical History:  Past Surgical History  Procedure Laterality Date  . Coronary angioplasty with stent placement    . Vastectomy    . Insert / replace / remove pacemaker  04/07/2013     Prescriptions prior to admission  Medication Sig Dispense Refill  . atorvastatin (LIPITOR) 10 MG tablet Take 1 tablet (10 mg total) by mouth every evening.  90 tablet  3  . Azelaic Acid (FINACEA) 15 % cream Apply 1 application topically daily. After skin is thoroughly washed and patted dry, gently but thoroughly massage a thin film of azelaic acid creame      . Cholecalciferol (VITAMIN D-3) 1000 UNITS CAPS Take 1 capsule by mouth daily.      . furosemide (LASIX) 20 MG tablet Take 3 tablets (60 mg total) by mouth daily.  90 tablet  12  . losartan (COZAAR) 50 MG tablet Take 1 tablet (50 mg total) by mouth daily.  90 tablet  3  . Multiple Vitamin (MULTIVITAMIN WITH  MINERALS) TABS Take 1 tablet by mouth 2 (two) times a week.       . nadolol (CORGARD) 20 MG tablet Take 10 mg by mouth daily.      . nitroGLYCERIN (NITROSTAT) 0.4 MG SL tablet Place 1 tablet (0.4 mg total) under the tongue every 5 (five) minutes as needed for chest pain. x3 doses as needed for chest pain  25 tablet  12  . omeprazole (PRILOSEC) 20 MG capsule Take 20 mg by mouth daily as needed (for heartburn).       Justin [name] Glycol-Propyl Glycol (SYSTANE ULTRA OP) Apply 1 drop to eye 4  (four) times daily as needed (for dry eyes).      . predniSONE (DELTASONE) 20 MG tablet Take 80 mg by mouth daily with breakfast.      . Probiotic Product (PROBIOTIC DAILY PO) Take 1 capsule by mouth daily.       Marland Kitchen pyridOXINE (VITAMIN B-6) 100 MG tablet Take 50 mg by mouth every evening.       . tamsulosin (FLOMAX) 0.4 MG CAPS capsule Take 0.4 mg by mouth at bedtime.      . vitamin B-12 (CYANOCOBALAMIN) 1000 MCG tablet Take 1,000 mcg by mouth every evening.      . Vitamin E (VITA-PLUS E PO) Take 1 capsule by mouth daily.        Inpatient Medications:  . sodium chloride   Intravenous Once  . ampicillin (OMNIPEN) IV  2 g Intravenous 4 times per day  . atorvastatin  10 mg Oral QPM  . furosemide  80 mg Oral Daily  . gentamicin  80 mg Intravenous Q24H  . insulin aspart  0-9 Units Subcutaneous TID WC  . pantoprazole  40 mg Oral Daily  . predniSONE  80 mg Oral Q breakfast  . pyridOXINE  100 mg Oral QPM  . tamsulosin  0.4 mg Oral QPC supper  . vitamin E  400 Units Oral Daily    Allergies: No Known Allergies  History   Social History  . Marital Status: Married    Spouse Name: N/A    Number of Children: 3  . Years of Education: N/A   Occupational History  .      Retired   Social History Main Topics  . Smoking status: Former Smoker -- 1.00 packs/day for 36 years    Types: Cigarettes    Start date: 01/25/1955    Quit date: 12/05/1991  . Smokeless tobacco: Never Used     Comment: quit smoking 22 yeras ago  . Alcohol Use: Yes     Comment: Occasional  . Drug Use: No  . Sexual Activity: Not on file   Other Topics Concern  . Not on file   Social History Narrative  . No narrative on file     Family History  Problem Relation Age of Onset  . Stroke Mother   . Hypertension Mother   . Stroke Sister   . Arthritis Sister     rheumatoid  . Heart attack Sister   . Anemia Sister   . Other Brother     tube put in aorta  . Anemia Brother   . Other Son     cortisone deficiency    . Arthritis Son   . Stroke Brother   . Alcohol abuse Brother   . Barrett's esophagus Son   . Colon cancer Maternal Grandmother   . Colon cancer Maternal Grandfather     BP 137/56  Pulse 75  Temp(Src) 98.5 F (36.9 C) (Oral)  Resp 18  Ht 5\' 11"  (1.803 m)  Wt 194 lb 6.4 oz (88.179 kg)  BMI 27.13 kg/m2  SpO2 99%  Physical Exam: Well appearing 80 yo man, NAD HEENT: Unremarkable,, AT Neck:  6 JVD, no thyromegally Back:  No CVA tenderness Lungs:  Clear with no wheezes, rales, or rhonchi HEART:  Regular rate rhythm, no murmurs, no rubs, no clicks Abd:  soft, positive bowel sounds, no organomegally, no rebound, no guarding Ext:  2 plus pulses, no edema, no cyanosis, no clubbing Skin:  No rashes no nodules Neuro:  CN II through XII intact, motor grossly intact  Labs:   Lab Results  Component Value Date   WBC 11.1* 07/13/2014   HGB 9.6* 07/13/2014   HCT 28.9* 07/13/2014   MCV 98.0 07/13/2014   PLT 18* 07/13/2014    Recent Labs Lab 07/13/14 0415  NA 142  K 3.5*  CL 104  CO2 24  BUN 35*  CREATININE 1.50*  CALCIUM 8.1*  PROT 6.0  BILITOT 0.9  ALKPHOS 61  ALT 60*  AST 71*  GLUCOSE 109*    Radiology/Studies: Ct Abdomen Pelvis Wo Contrast 07/09/2014   CLINICAL DATA:  Enterococcus bacteremia. Evaluate for abdominal source. Evaluate for potential tumor. Enterobacter is also present in the urine.  EXAM: CT ABDOMEN AND PELVIS WITHOUT CONTRAST  TECHNIQUE: Multidetector CT imaging of the abdomen and pelvis was performed following the standard protocol without IV contrast.  COMPARISON:  No priors.  FINDINGS: Lung Bases: Atherosclerotic calcifications in the right coronary artery. Pacemaker lead terminating in the right ventricular apex. Moderate-sized hiatal hernia. Moderate to large bilateral pleural effusions which appear to layer dependently and are associated with extensive passive atelectasis in the visualized portions of the lower lobes of the lungs bilaterally.  Abdomen/Pelvis:  9 mm low attenuation lesion in segment 2 of the liver is incompletely characterized on today's noncontrast CT examination, but is unchanged, and favored to represent a tiny cyst. No other focal hepatic lesions are identified on today's non contrast CT examination. There is a tiny calcifications in the head of the pancreas (image 31 of series 2) which appears to be immediately posterior to the distal common bile duct, potentially related to prior episodes of pancreatitis. Other small calcifications are also noted in the head and tail of the pancreas as well. The calcification in the head of the pancreas is not favored to be within the common bile duct, as the common bile duct is normal in caliber measuring 4 mm, and there is no intrahepatic biliary ductal dilatation to suggest obstruction. The unenhanced appearance of the gallbladder, spleen and bilateral adrenal glands is unremarkable. There is bilateral perinephric stranding which is nonspecific. 1.5 cm intermediate attenuation lesion extending exophytically off the lateral aspect of the upper pole of the right kidney is incompletely characterized on today's non contrast CT examination, but is statistically likely to represent a mildly proteinaceous cyst.  Normal appendix. Trace volume of ascites. No pneumoperitoneum. No pathologic distention of small bowel. 10 mm short axis inguinal lymph nodes bilaterally are nonspecific. No other definite lymphadenopathy identified in the abdomen or pelvis on today's non contrast CT examination. Mild diffuse mesenteric edema. Prostate gland is enlarged measuring 4.9 x 6.1 cm. Urinary bladder is generally unremarkable in appearance, however, the attenuation values in the urine are rather high ranging from 45-55 HU, suggesting proteinaceous or hemorrhagic contents.  Musculoskeletal: Mild diffuse body wall edema. There are no aggressive appearing lytic or  blastic lesions noted in the visualized portions of the skeleton.   IMPRESSION: 1. No definite source for bacteremia identified on today's examination. 2. Relatively high attenuation of the urine is of uncertain etiology and significance, but may suggest some proteinaceous or hemorrhagic contents. 3. Findings suggestive of anasarca, including moderate to large bilateral pleural effusions, trace volume of ascites, mild diffuse mesenteric edema and diffuse body wall edema. 4. Prostatomegaly. 5. Additional incidental findings, as above.   Electronically Signed   By: Vinnie Langton M.D.   On: 07/09/2014 12:46   Dg Chest 2 View 07/14/2014   CLINICAL DATA:  Fever, chills, shortness of breath.  EXAM: CHEST  2 VIEW  COMPARISON:  06/10/2014  FINDINGS: Left pacer in place with leads in the right atrium and right ventricle. Heart is normal size. Mild peribronchial thickening. No confluent airspace opacities or effusions. No acute bony abnormality.  IMPRESSION: Bronchitic changes.   Electronically Signed   By: Rolm Baptise M.D.   On: 07/27/2014 15:07   EXB:MWUXL rhythm with ventricular pacing, rate 93  DEVICE HISTORY: MDT ADDRL1 pacemaker implanted 04-2013 by Dr Caryl Comes - 5076 right atrial and right ventricular leads  A/P 1. 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 Thrombocytopenia Rec: He will need PM system removal, now 15 months after implant. He will need either improvement in his plt count or plt transfusion. He is tentatively scheduled for PM system extraction. If his plt count continues to improve, might wait until Wedniesday for PM system extraction to avoid another plt transfusion.  Mikle Bosworth.D.

## 2014-07-13 NOTE — Progress Notes (Signed)
UR completed Khary Schaben K. Mililani Murthy, RN, BSN, MSHL, CCM  07/13/2014 11:52 AM

## 2014-07-13 NOTE — Progress Notes (Signed)
Patient ID: Justin [name], male   DOB: 1933-05-31, 80 y.o.   MRN: [id number]         [facility]    Date of Admission:  08/02/2014    Total days of antibiotics 8        Day 2 ampicillin        Day 2 gentamicin         Principal Problem:   Enterococcal bacteremia Active Problems:   CAD (coronary artery disease)   Anemia   Thrombocytopenia   Pacemaker-[employer]   Mitral regurgitation   Sepsis   TIA (transient ischemic attack)   . sodium chloride   Intravenous Once  . sodium chloride   Intravenous Once  . ampicillin (OMNIPEN) IV  2 g Intravenous 4 times per day  . atorvastatin  10 mg Oral QPM  . furosemide  80 mg Oral Daily  . gentamicin  80 mg Intravenous Q24H  . insulin aspart  0-9 Units Subcutaneous TID WC  . pantoprazole  40 mg Oral Daily  . predniSONE  80 mg Oral Q breakfast  . pyridOXINE  100 mg Oral QPM  . tamsulosin  0.4 mg Oral QPC supper  . vitamin E  400 Units Oral Daily    Subjective: He states that he is feeling much better. His energy level is better and his appetite is improving. Review of Systems: Pertinent items are noted in HPI.  Past Medical History  Diagnosis Date  . CAD (coronary artery disease) prior stenting 1999   . Dyslipidemia   . HTN (hypertension)   . WPW (Wolff-Parkinson-White syndrome) loss of preexcitation   . Atrial fibrillation   . AV block, Mobitz II   . Presyncope   . Myocardial infarction   . Pacemaker 04/07/2013  . Arthritis   . History of chicken pox   . Anemia   . Hypernatremia 06/03/2013  . Thrombocytopenia, unspecified 06/03/2013  . Leukopenia 06/03/2013  . Obstructive sleep apnea 06/03/2013    USES CPAP  . Urinary incontinence 06/03/2013  . Hyperglycemia 12/21/2013  . Pancytopenia 06/03/2013  . Iron deficiency anemia, unspecified 07/02/2014  . Malabsorption of iron 07/02/2014  . Hypotestosteronemia 07/02/2014  . ITP (idiopathic thrombocytopenic purpura) 07/02/2014    History  Substance Use Topics  .  Smoking status: Former Smoker -- 1.00 packs/day for 36 years    Types: Cigarettes    Start date: 01/25/1955    Quit date: 12/05/1991  . Smokeless tobacco: Never Used     Comment: quit smoking 22 yeras ago  . Alcohol Use: Yes     Comment: Occasional    Family History  Problem Relation Age of Onset  . Stroke Mother   . Hypertension Mother   . Stroke Sister   . Arthritis Sister     rheumatoid  . Heart attack Sister   . Anemia Sister   . Other Brother     tube put in aorta  . Anemia Brother   . Other Son     cortisone deficiency  . Arthritis Son   . Stroke Brother   . Alcohol abuse Brother   . Barrett's esophagus Son   . Colon cancer Maternal Grandmother   . Colon cancer Maternal Grandfather     No Known Allergies  Objective: Temp:  [98.5 F (36.9 C)-99.1 F (37.3 C)] 98.5 F (36.9 C) (08/10 0457) Pulse Rate:  [72-75] 75 (08/10 0457) Resp:  [18] 18 (08/10 0457) BP: (137-138)/(46-56) 137/56 mmHg (08/10 0457) SpO2:  [  99 %] 99 % (08/10 0457) Weight:  [194 lb 6.4 oz (88.179 kg)] 194 lb 6.4 oz (88.179 kg) (08/10 0457)  General: He is smiling, sitting up eating his lunch Skin: No rash Lungs: Clear Cor: Regular S1 and S2 with a 1/6 systolic murmur   Lab Results Lab Results  Component Value Date   WBC 11.1* 07/13/2014   HGB 9.6* 07/13/2014   HCT 28.9* 07/13/2014   MCV 98.0 07/13/2014   PLT 18* 07/13/2014    Lab Results  Component Value Date   CREATININE 1.50* 07/13/2014   BUN 35* 07/13/2014   NA 142 07/13/2014   K 3.5* 07/13/2014   CL 104 07/13/2014   CO2 24 07/13/2014    Lab Results  Component Value Date   ALT 60* 07/13/2014   AST 71* 07/13/2014   ALKPHOS 61 07/13/2014   BILITOT 0.9 07/13/2014      Microbiology: Recent Results (from the past 240 hour(s))  CULTURE, BLOOD (ROUTINE X 2)     Status: None   Collection Time    07/23/2014  3:30 PM      Result Value Ref Range Status   Specimen Description BLOOD LEFT ARM   Final   Special Requests BOTTLES DRAWN AEROBIC  AND ANAEROBIC [facility]   Final   Culture  Setup Time     Final   Value: 07/26/2014 19:23     Performed at [facility]   Culture     Final   Value: ENTEROCOCCUS SPECIES     Note: SUSCEPTIBILITIES PERFORMED ON PREVIOUS CULTURE WITHIN THE LAST 5 DAYS.     Note: Gram Stain Report Called to,Read Back By and Verified With: JAMES ARTIS 07/07/14 AT 0730 RIDK     Performed at [facility]   Report Status 07/09/2014 FINAL   Final  URINE CULTURE     Status: None   Collection Time    07/21/2014  4:18 PM      Result Value Ref Range Status   Specimen Description URINE, CLEAN CATCH   Final   Special Requests NONE   Final   Culture  Setup Time     Final   Value: 07/07/2014 02:46     Performed at [facility]     Final   Value: 35,000 COLONIES/ML     Performed at [facility]   Culture     Final   Value: ENTEROBACTER CLOACAE     Performed at [facility]   Report Status 07/09/2014 FINAL   Final   Organism ID, Bacteria ENTEROBACTER CLOACAE   Final  CULTURE, BLOOD (ROUTINE X 2)     Status: None   Collection Time    07/05/2014  4:30 PM      Result Value Ref Range Status   Specimen Description BLOOD R ARM   Final   Special Requests BOTTLES DRAWN AEROBIC AND ANAEROBIC [place]   Final   Culture  Setup Time     Final   Value: 08/01/2014 19:22     Performed at [facility]   Culture     Final   Value: ENTEROCOCCUS SPECIES     Note: Gram Stain Report Called to,Read Back By and Verified With: JAMES ARTIS 07/07/14 AT 0730 [place]     Performed at [facility]   Report Status 07/09/2014 FINAL   Final   Organism ID, Bacteria ENTEROCOCCUS SPECIES   Final  MRSA PCR SCREENING  Status: None   Collection Time    08/02/2014  7:52 PM      Result Value Ref Range Status   MRSA by PCR NEGATIVE  NEGATIVE Final   Comment:            The GeneXpert MRSA Assay (FDA     approved for NASAL specimens     only), is one component of a     comprehensive MRSA  colonization     surveillance program. It is not     intended to diagnose MRSA     infection nor to guide or     monitor treatment for     MRSA infections.  CULTURE, BLOOD (ROUTINE X 2)     Status: None   Collection Time    07/08/14 12:00 PM      Result Value Ref Range Status   Specimen Description BLOOD RIGHT HAND   Final   Special Requests BOTTLES DRAWN AEROBIC ONLY [facility]   Final   Culture  Setup Time     Final   Value: 07/08/2014 17:14     Performed at [facility]   Culture     Final   Value:        BLOOD CULTURE RECEIVED NO GROWTH TO DATE CULTURE WILL BE HELD FOR 5 DAYS BEFORE ISSUING A FINAL NEGATIVE REPORT     Performed at [facility]   Report Status PENDING   Incomplete  CULTURE, BLOOD (ROUTINE X 2)     Status: None   Collection Time    07/08/14 12:10 PM      Result Value Ref Range Status   Specimen Description BLOOD LEFT HAND   Final   Special Requests BOTTLES DRAWN AEROBIC ONLY [facility]   Final   Culture  Setup Time     Final   Value: 07/08/2014 17:05     Performed at [facility]   Culture     Final   Value:        BLOOD CULTURE RECEIVED NO GROWTH TO DATE CULTURE WILL BE HELD FOR 5 DAYS BEFORE ISSUING A FINAL NEGATIVE REPORT     Performed at [facility]   Report Status PENDING   Incomplete    Assessment: He is improving on therapy for 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  Plan: 1. Continue ampicillin and gentamicin 2. Monitor renal function closely 3. TEE  Michel Bickers, MD [facility] for [facility] [place] [facility] [phone] pager   [phone] cell 07/13/2014, 2:40 PM

## 2014-07-13 NOTE — Progress Notes (Signed)
[name] really looks good today. He just looks much more healthier. He looks much more alert. He's had no further neurological episodes.  His appetite is really doing well. He's had no nausea or vomiting. He's had no diarrhea.  There's been no bleeding. His platelets count is up to 18,000 today. We may be seeing a response with his treatments now. I will give him another dose of Nplate tomorrow.  His hemoglobin is 9.6. I don't have his kidney function tests back yet.  He's been doing some walking. Hopefully, physical therapy will come by.  It sounds like he may have a TEE tomorrow. If so, and he needs platelets, we can have them ready.  His vital signs are all stable. Blood pressure 137/56. Temperature is 98.5. Pulse is 75. Oral exam shows no thrush. Lungs are clear. Cardiac exam regular in rhythm. His 1/6 systolic murmur. Abdomen soft. Has good bowel sounds. There is no fluid wave. There is no palpable liver or spleen tip. Back exam no tenderness over the spine ribs or hips. Extremities shows no clubbing cyanosis or edema. Neurological exam is nonfocal.  Again, he really looks good. His "color" looks much better. He just looks more energetic. We will see what his platelet counts are tomorrow. Other this will be very important. Again, I will give him another dose of Nplate. I will do this tomorrow. If I find that his blood count continues to rise, then hopefully he will not need any platelets for his TEE.  Pete E.  Ephesians 4:31-32

## 2014-07-13 NOTE — Progress Notes (Signed)
Pt. Seen and examined. Agree with the NP/PA-C note as written.  Platelet count is improving. At this point, he could probably have TEE with platelet infusion tomorrow. We will work to schedule that and coordinate with the blood bank. Will d/w Dr. Lovena Le after the TEE to determine if he is going to pursue pacer extraction.  Pixie Casino, MD, [facility] Attending Cardiologist [place]

## 2014-07-13 NOTE — Progress Notes (Signed)
VASCULAR LAB PRELIMINARY  PRELIMINARY  PRELIMINARY  PRELIMINARY  Transcranial Doppler  completed.    Preliminary report:  Transcranial Doppler completed  Nelma Phagan, RVS 07/13/2014, 11:02 AM

## 2014-07-13 NOTE — Progress Notes (Signed)
Subjective: Breathing improved. Denies fever and chills.   Objective: Vital signs in last 24 hours: Temp:  [98.5 F (36.9 C)-99.1 F (37.3 C)] 98.5 F (36.9 C) (08/10 0457) Pulse Rate:  [72-77] 75 (08/10 0457) Resp:  [18-20] 18 (08/10 0457) BP: (137-138)/(46-56) 137/56 mmHg (08/10 0457) SpO2:  [98 %-99 %] 99 % (08/10 0457) Weight:  [194 lb 6.4 oz (88.179 kg)] 194 lb 6.4 oz (88.179 kg) (08/10 0457) Last BM Date: 07/12/14  Intake/Output from previous day: 08/09 0701 - 08/10 0700 In: 960 [P.O.:960] Out: 2175 [Urine:2175] Intake/Output this shift:    Medications Current Facility-Administered Medications  Medication Dose Route Frequency Provider Last Rate Last Dose  . 0.9 %  sodium chloride infusion  250 mL Intravenous PRN Corey Harold, NP      . 0.9 %  sodium chloride infusion   Intravenous Once Volanda Napoleon, MD      . acetaminophen (TYLENOL) tablet 650 mg  650 mg Oral Q4H PRN Corey Harold, NP      . ampicillin (OMNIPEN) 2 g in sodium chloride 0.9 % 50 mL IVPB  2 g Intravenous 4 times per day Truman Hayward, MD   2 g at 07/13/14 [phone]  . atorvastatin (LIPITOR) tablet 10 mg  10 mg Oral QPM Belkys A Regalado, MD   10 mg at 07/12/14 1741  . furosemide (LASIX) tablet 80 mg  80 mg Oral Daily Volanda Napoleon, MD   80 mg at 07/12/14 1041  . gentamicin (GARAMYCIN) IVPB 80 mg  80 mg Intravenous Q24H Dareen Piano, RPH   80 mg at 07/12/14 1300  . insulin aspart (novoLOG) injection 0-9 Units  0-9 Units Subcutaneous TID WC Belkys A Regalado, MD   3 Units at 07/12/14 1630  . pantoprazole (PROTONIX) EC tablet 40 mg  40 mg Oral Daily Corey Harold, NP   40 mg at 07/12/14 1041  . polyvinyl alcohol (LIQUIFILM TEARS) 1.4 % ophthalmic solution 1 drop  1 drop Both Eyes PRN Vishal Mungal, MD      . predniSONE (DELTASONE) tablet 80 mg  80 mg Oral Q breakfast Volanda Napoleon, MD   80 mg at 07/13/14 [phone]  . pyridOXINE (VITAMIN B-6) tablet 100 mg  100 mg Oral QPM Corey Harold, NP    100 mg at 07/12/14 1740  . tamsulosin (FLOMAX) capsule 0.4 mg  0.4 mg Oral QPC supper Vishal Mungal, MD   0.4 mg at 07/12/14 1741  . vitamin E capsule 400 Units  400 Units Oral Daily Marijean Heath, NP   400 Units at 07/12/14 1041    PE: General appearance: alert, cooperative and no distress Neck: no carotid bruit Lungs: clear to auscultation bilaterally Heart: regular rate and rhythm and 3/6 murmur throughout the precordium Extremities: 2+ LEE Pulses: 2+ and symmetric Skin: warm and dry Neurologic: Grossly normal  Lab Results:   Recent Labs  07/11/14 0345 07/12/14 0430 07/13/14 0415  WBC 12.1* 12.0* 11.1*  HGB 9.4* 9.4* 9.6*  HCT 28.7* 28.3* 28.9*  PLT 8* 10* 18*   BMET  Recent Labs  07/11/14 0345 07/12/14 0430 07/13/14 0415  NA 141 141 142  K 3.7 3.6* 3.5*  CL 105 105 104  CO2 21 25 24   GLUCOSE 137* 109* 109*  BUN 39* 38* 35*  CREATININE 1.43* 1.56* 1.50*  CALCIUM 8.3* 8.4 8.1*    Assessment/Plan  Principal Problem:   Enterococcal bacteremia Active Problems:   CAD (coronary  artery disease)   Anemia   Thrombocytopenia   Pacemaker-Medtronic   Mitral regurgitation   Sepsis   TIA (transient ischemic attack)   LOS: 7 days   1. Enterococcal bacteremia  - Platelets continue to improve. Now at 18K. Will schedule for TEE tomorrow. - For now, would continue antibiotics   2. Worsening MR on recent echo - probably related to endocarditis.   3. CAD (coronary artery disease)  - h/o LAD infarct and PACI in PCI in 03/1998  - Nuc 03/2013 no scar or ischemia   4. Anemia  - previous EKG and colonoscopy unrevealing  - s/p 1 unit PRBC on 8/4   5. Severe Thrombocytopenia  - recently worked up for ITP  - Dr. Marin Olp seeing, receiving nPlate  - platelet count is rising slowly, now at 18K (10K yesterday)  6. S/p dual chamber pacemaker-Medtronic  - Discussed with Dr. Lovena Le - lead extraction cannot be performed with low platelet count and not until    definitive TEE has been performed. Will try to arrange for TEE tomorrow to evaluate leads and valves.   7. Acute on chronic diastolic CHF  - stable on lasix 80 mg daily   8. Delirium  - negative CT of head w/o contrast 8/6  - neuro consulted by primary team   9. Chronic renal insufficiency  - creatinine is slowly coming down to baseline. 1.50 today   10. Hypokalemic: 3.5. give supplemental K  Emmory Solivan M. Ladoris Gene 07/13/2014 8:31 AM

## 2014-07-13 NOTE — Progress Notes (Signed)
Physical Therapy Treatment Patient Details Name: TIYON SANOR MRN: [id number] DOB: [birth year] Today's Date: 07/13/2014    History of Present Illness Pt is a 80 y.o. male with history of CAD status post stents, hypertension, dyslipidemia, a-fib, pacemaker, ITP currently being treated with IVIG, who presents to the emergency department with complaints of fever and shortness of breath that started 8/2. In ED found to have leukocytosis, hypotension, and lactic acidosis. Transferred to MICU for suspected sepsis.     PT Comments    Pt progressing towards physical therapy goals. Was able to improve ambulation distance, with less DOE noted. Pt states he feels comfortable maneuvering in the room with RW for support. Will continue to follow.   Follow Up Recommendations  Home health PT;Supervision for mobility/OOB     Equipment Recommendations  Rolling walker with 5" wheels    Recommendations for Other Services       Precautions / Restrictions Precautions Precautions: Fall Restrictions Weight Bearing Restrictions: No    Mobility  Bed Mobility               General bed mobility comments: Pt sitting in chair upon PT arrival.   Transfers Overall transfer level: Needs assistance Equipment used: None Transfers: Sit to/from Stand Sit to Stand: Supervision         General transfer comment: VC's for hand placement on seated surface for safety. No assist to power-up to full standing.   Ambulation/Gait Ambulation/Gait assistance: Min guard Ambulation Distance (Feet): 180 Feet (x2) Assistive device: None;Rolling walker (2 wheeled) Gait Pattern/deviations: Step-through pattern;Decreased stride length;Trunk flexed Gait velocity: Decreased Gait velocity interpretation: Below normal speed for age/gender General Gait Details: Pt ambulating with and without RW. With the RW, pt less fatigued/SOB at end of gait training, and overall gait speed was higher. Recommended RW at all other  times and no AD with therapy only.   Stairs            Wheelchair Mobility    Modified Rankin (Stroke Patients Only)       Balance Overall balance assessment: Needs assistance Sitting-balance support: Feet supported;No upper extremity supported Sitting balance-Leahy Scale: Good     Standing balance support: No upper extremity supported;During functional activity Standing balance-Leahy Scale: Fair                      Cognition Arousal/Alertness: Awake/alert Behavior During Therapy: WFL for tasks assessed/performed Overall Cognitive Status: Within Functional Limits for tasks assessed                      Exercises General Exercises - Lower Extremity Long Arc Quad: 15 reps Hip ABduction/ADduction: 15 reps    General Comments        Pertinent Vitals/Pain Pain Assessment: No/denies pain    Home Living                      Prior Function            PT Goals (current goals can now be found in the care plan section) Acute Rehab PT Goals Patient Stated Goal: To return home with wife PT Goal Formulation: With patient/family Time For Goal Achievement: 07/16/14 Potential to Achieve Goals: Good Progress towards PT goals: Progressing toward goals    Frequency  Min 3X/week    PT Plan Current plan remains appropriate    Co-evaluation             End of  Session Equipment Utilized During Treatment: Gait belt Activity Tolerance: Patient limited by fatigue Patient left: with call bell/phone within reach;in chair     Time: 6378-5885 PT Time Calculation (min): 29 min  Charges:  $Gait Training: 8-22 mins $Therapeutic Activity: 8-22 mins                    G Codes:      Jolyn Lent 2014-07-19, 4:53 PM  Jolyn Lent, PT, DPT Acute Rehabilitation Services Pager: [phone]

## 2014-07-13 NOTE — Progress Notes (Signed)
Stroke Team Progress Note  HISTORY Justin [name] is an 80 y.o. male who was initially brought to hospital for dyspnea. He is currently being followed by Dr. Marin Olp for ITP and treated with systemic steroids and was treated with IVIG. While in hospital he was noted to have both enterococcus and Enterobacter in his urine and blood. ID has been following. No obvious source of bacteremia has been found. This AM he went to pick up the top to the dish and noted he had a hard time grasping and feeling the lid with his right hand. He states "this only lasted for a few seconds" then resolved. "If he was not in the hospital he probably would have not thought anything about it". Currently he is back to his baseline and feels fine. Cardiology was consulted for possible worsening of MR. They have reviewed the echo and feels it has not worsened. Patient was to get a TEE for possibility Of endocarditis but due to PLT of 6 this is going to be postponed.  Note --Patient has pacer and cannot obtain MRI  Patient was not administered TPA secondary to low platelets. He was admitted to the medical floor for further evaluation and treatment.  SUBJECTIVE  .  Overall he feels his condition is stable.  TEE not done due to low platelets. But planned for 07/09/2014  he has seen Dr Debara Pickett & [facility] OBJECTIVE Most recent Vital Signs: Filed Vitals:   07/12/14 0436 07/12/14 1413 07/12/14 2033 07/13/14 0457  BP: 152/55 137/50 138/46 137/56  Pulse: 74 77 72 75  Temp: 98.7 F (37.1 C) 98.7 F (37.1 C) 99.1 F (37.3 C) 98.5 F (36.9 C)  TempSrc: Oral Oral Oral Oral  Resp: 19 20 18 18   Height:      Weight: 87.4 kg (192 lb 10.9 oz)   88.179 kg (194 lb 6.4 oz)  SpO2: 94% 98% 99% 99%   CBG (last 3)   Recent Labs  07/12/14 1606 07/13/14 0616 07/13/14 1206  GLUCAP 228* 100* 399*    IV Fluid Intake:     MEDICATIONS  . sodium chloride   Intravenous Once  . ampicillin (OMNIPEN) IV  2 g Intravenous 4 times per day  .  atorvastatin  10 mg Oral QPM  . furosemide  80 mg Oral Daily  . gentamicin  80 mg Intravenous Q24H  . insulin aspart  0-9 Units Subcutaneous TID WC  . pantoprazole  40 mg Oral Daily  . predniSONE  80 mg Oral Q breakfast  . pyridOXINE  100 mg Oral QPM  . tamsulosin  0.4 mg Oral QPC supper  . vitamin E  400 Units Oral Daily   PRN:  sodium chloride, acetaminophen, polyvinyl alcohol  Diet:  Cardiac   Activity:  Bedrest  DVT Prophylaxis:  SCds CLINICALLY SIGNIFICANT STUDIES Basic Metabolic Panel:  Recent Labs Lab 07/28/2014 1530 08/02/2014 2315  07/12/14 0430 07/13/14 0415  NA 136*  --   < > 141 142  K 4.6  --   < > 3.6* 3.5*  CL 99  --   < > 105 104  CO2 24  --   < > 25 24  GLUCOSE 130*  --   < > 109* 109*  BUN 45*  --   < > 38* 35*  CREATININE 1.50*  --   < > 1.56* 1.50*  CALCIUM 8.9  --   < > 8.4 8.1*  MG  --  2.0  --   --   --   < > =  values in this interval not displayed. Liver Function Tests:   Recent Labs Lab 07/12/14 0430 07/13/14 0415  AST 69* 71*  ALT 53 60*  ALKPHOS 56 61  BILITOT 0.8 0.9  PROT 5.8* 6.0  ALBUMIN 2.2* 2.3*   CBC:  Recent Labs Lab 07/10/2014 1530  07/08/14 0235  07/12/14 0430 07/13/14 0415  WBC 13.4*  < > 19.7*  < > 12.0* 11.1*  NEUTROABS 12.4*  --  18.0*  --   --   --   HGB 9.8*  < > 9.6*  < > 9.4* 9.6*  HCT 30.4*  < > 28.3*  < > 28.3* 28.9*  MCV 96.5  < > 93.1  < > 97.3 98.0  PLT 8*  < > 8*  < > 10* 18*  < > = values in this interval not displayed. Coagulation:   Recent Labs Lab 07/29/2014 2140  LABPROT 17.9*  INR 1.48   Cardiac Enzymes:   Recent Labs Lab 07/28/2014 1530 08/03/2014 2140  TROPONINI 0.68* 1.59*   Urinalysis:   Recent Labs Lab 07/16/2014 1617  COLORURINE AMBER*  LABSPEC 1.017  PHURINE 5.0  GLUCOSEU NEGATIVE  HGBUR LARGE*  BILIRUBINUR NEGATIVE  KETONESUR NEGATIVE  PROTEINUR 100*  UROBILINOGEN 1.0  NITRITE NEGATIVE  LEUKOCYTESUR MODERATE*   Lipid Panel    Component Value Date/Time   CHOL 126 01/13/2014  0849   TRIG 66 01/13/2014 0849   HDL 49 01/13/2014 0849   CHOLHDL 2.6 01/13/2014 0849   VLDL 13 01/13/2014 0849   LDLCALC 64 01/13/2014 0849   HgbA1C  Lab Results  Component Value Date   HGBA1C 5.6 07/12/2014    Urine Drug Screen:   No results found for this basename: labopia,  cocainscrnur,  labbenz,  amphetmu,  thcu,  labbarb    Alcohol Level: No results found for this basename: ETH,  in the last 168 hours  No results found.      MRI of the brain  pacer  MRA of the brain  pacer  Carotid Doppler  Preliminary findings: Bilateral: 1-39% ICA stenosis. Vertebral artery flow is antegrade.  Right subclavian artery demonstrates significant stenosis compared to left subclavian artery.      2D Echocardiogram  Systolic function was normal. The estimated ejection fraction was in the range of 50% to 55%. Wall motion was normal; there were no regional wall motion abnormalities.    CXR  07/07/2014 Persistent changes of congestive heart failure pulmonary edema.  Small pleural effusions   EKG 07/12/2014 Atrial-sensed ventricular-paced rhythm Therapy Recommendations  None as TIA  Physical Exam  pleasant elderly caucasian male not in distress.Awake alert. Afebrile. Head is nontraumatic. Neck is supple without bruit. Hearing is normal. Cardiac exam no murmur or gallop. Lungs are clear to auscultation. Distal pulses are well felt. Neurological Exam ;  Awake  Alert oriented x 3. Normal speech and language.eye movements full without nystagmus.fundi were not visualized. Vision acuity and fields appear normal. Hearing is normal. Palatal movements are normal. Face symmetric. Tongue midline. Normal strength, tone, reflexes and coordination. Normal sensation. Gait deferred. ASSESSMENT Mr. BRANDELL [name] is a 80 y.o. male presenting with  Transient right hand weakness and numbness likely from left brain TIA.  Etiology to be determined. Possiblities include endocarditis given enterococcus bacteremia but  stroke w/u pending.  On no antiplatelets prior to admission. Now on no antiplatelet for secondary stroke prevention. Patient with resultant  No deficits.. Stroke work up underway.   Enterococcus bacteremia  LDL 64  HbA1c 5.5   Hospital day # 7  TREATMENT/PLAN  Agree to hold antiplatelts for secondary stroke prevention until platelet count greater than 50,000  TEE would be beneficial to decide on duration of antibiotics when deemed safe to be done by cardiology  May need pacer removal if endocarditis confirmed  D/W patient      Antony Contras, MD Medical Director [place] Pager: [phone] 07/13/2014 12:31 PM         To contact Stroke Continuity provider, please refer to [email or web address] After hours, contact General Neurology

## 2014-07-13 NOTE — Progress Notes (Signed)
Orders about platelets clarified with Brittainy, PA.  Platelet infusion to be started before TEE tomorrow and run through procedure.  Will continue to monitor.

## 2014-07-13 NOTE — Progress Notes (Signed)
[facility] PROGRESS NOTE  Justin [name] NUU:[id number] DOB: 08/31/1933 DOA: 07/04/2014 PCP: Penni Homans, MD  Assessment/Plan: 80 yo male with diastolic heart failure currently treated for ITP with systemic steroids and on IVIG presents to [facility] 8/3 with dyspnea and fever. Hypotensive requiring vasopressors.  Enterococcus Bacteremia: Received IV vancomycin for 4 days. Blood Culture 8-5 no growth to date.  WBC trending down. CT abdomen  no mas. -Will hold on placement of PICC line for now until infection better controlled. Will follow ID recommendation to when to order PICC line. . At time of PICC line placement will need to order double lumen per Dr Marin Olp request.  -Vancomycin change to ampicillin 8-8.Start gentamycin 8-9.  -Plan for TEE tomorrow.  Need platelet transfusion prior to TEE. This will need to be arrange with Dr Marin Olp.  -HIV non reactive, viral hepatitis negative.  .  -Day 7 antibiotics.   Transient Hallucination 8-6: Resolved. Related to medications, infections, delirium. CT head was negative.   Transient hand weakness, numbness. ? TIA, Stroke. Marland Kitchen  Neurology recommending TEE to rule out endocarditis. Multiple risk factors for TIA, Stroke. Difficult situation with thrombocytopenia, high risk for bleeding.  -Carotid Duplex (Doppler) has been completed. Preliminary findings: Bilateral: 1-39% ICA stenosis. Vertebral artery flow is antegrade. Right subclavian artery demonstrates significant stenosis compared to left subclavian artery. -Hb-A1 C: 5.6 -Continue with Lipitor. Follow LFT.   Possible pneumonia vs TRALI from IVIG  Bilateral effusions - stable Continue with Lasix. Monitor renal function.   Diarrhea; Resolved.  Elevated troponin: in setting sepsis. ECHO; normal Wall motion. Cardiology to following. High risk for anticoagulation.   Enterobacter Urine: 30,000 colonies.   Chronic diastolic heart failure - on lasix at home Restarted lasix (80 mg ) on 8-5.   Urine out put 2.1 L on 8-9.  Dyspnea improving.   Sepsis: Enterococcus bacteremia. Received pressors (Levophed). BP stable. On antibiotics.   AKI:in setting of infection. Monitor on Lasix. Good urine out put. Follow cr trend.   Thrombocytopenia, ITP, first IVIG treatment 8/3, on chronic prednisone. Platelet increase to  18. Dr Marin Olp following. Continue with prednisone 80 mg daily. Received one dose of Nplate.  Anemia; received Epogen 8-8. Urine electrophoresis pending.   Suspected Adrenal insufficiency ( chronic steroids )  Continue with  prednisone.   H/o PAF, now in SR   Code Status: Partial: yes to antiarrythmic and vasopressors.   Family Communication: care discussed with Daughter Disposition Plan: home when stable.    Consultants:  ID  Cardiology.  neurology   Procedures: 6/24 TTE >>> LVEF 55%, Grade 2 DD  8/3 CT chest >>> bilateral effusions, bilateral GGO, several nodular opacities  8/4 ECHO >>> Mild to mod MR, EF 50-55%, G2 Dia Dys Fcn   Antibiotics:  Vancomycin 8-3---8-8  Ampicillin. 8-9  Gentamycin 8-9  HPI/Subjective: Feels sleepy today.  Breathing better, no chest pain.   Objective: Filed Vitals:   07/13/14 0457  BP: 137/56  Pulse: 75  Temp: 98.5 F (36.9 C)  Resp: 18    Intake/Output Summary (Last 24 hours) at 07/13/14 1300 Last data filed at 07/13/14 1201  Gross per 24 hour  Intake    840 ml  Output   2175 ml  Net  -1335 ml   Filed Weights   07/11/14 0636 07/12/14 0436 07/13/14 0457  Weight: 87.5 kg (192 lb 14.4 oz) 87.4 kg (192 lb 10.9 oz) 88.179 kg (194 lb 6.4 oz)    Exam:   General:  No  distress.   Cardiovascular: S 1, S 2 IRR. JVD.   Respiratory: decrease breath sound bases, B/L crackles.   Abdomen: Bs present, soft, NT  Musculoskeletal: plus 2 edema LE.   Neuro exam ; non focal.   Data Reviewed: Basic Metabolic Panel:  Recent Labs Lab 07/17/2014 1530 07/16/2014 2315  07/09/14 0945 07/10/14 0410  07/11/14 0345 07/12/14 0430 07/13/14 0415  NA 136*  --   < > 139 139 141 141 142  K 4.6  --   < > 3.8 3.4* 3.7 3.6* 3.5*  CL 99  --   < > 106 104 105 105 104  CO2 24  --   < > 22 23 21 25 24   GLUCOSE 130*  --   < > 141* 135* 137* 109* 109*  BUN 45*  --   < > 43* 38* 39* 38* 35*  CREATININE 1.50*  --   < > 1.57* 1.53* 1.43* 1.56* 1.50*  CALCIUM 8.9  --   < > 8.4 8.2* 8.3* 8.4 8.1*  MG  --  2.0  --   --   --   --   --   --   < > = values in this interval not displayed. Liver Function Tests:  Recent Labs Lab 07/09/14 0945 07/10/14 0410 07/11/14 0345 07/12/14 0430 07/13/14 0415  AST 64* 54* 56* 69* 71*  ALT 53 49 47 53 60*  ALKPHOS 75 64 68 56 61  BILITOT 1.4* 1.1 0.9 0.8 0.9  PROT 6.5 5.9* 5.9* 5.8* 6.0  ALBUMIN 2.1* 2.1* 2.1* 2.2* 2.3*   No results found for this basename: LIPASE, AMYLASE,  in the last 168 hours No results found for this basename: AMMONIA,  in the last 168 hours CBC:  Recent Labs Lab 07/28/2014 1530  07/08/14 0235 07/09/14 0945 07/10/14 0410 07/11/14 0345 07/12/14 0430 07/13/14 0415  WBC 13.4*  < > 19.7* 16.7* 10.6* 12.1* 12.0* 11.1*  NEUTROABS 12.4*  --  18.0*  --   --   --   --   --   HGB 9.8*  < > 9.6* 10.8* 9.7* 9.4* 9.4* 9.6*  HCT 30.4*  < > 28.3* 32.1* 28.8* 28.7* 28.3* 28.9*  MCV 96.5  < > 93.1 93.6 93.8 97.3 97.3 98.0  PLT 8*  < > 8* 10* 6* 8* 10* 18*  < > = values in this interval not displayed. Cardiac Enzymes:  Recent Labs Lab 07/31/2014 1530 07/17/2014 2140  TROPONINI 0.68* 1.59*   BNP (last 3 results)  Recent Labs  07/19/2014 1530  PROBNP 12303.0*   CBG:  Recent Labs Lab 07/12/14 0621 07/12/14 1120 07/12/14 1606 07/13/14 0616 07/13/14 1206  GLUCAP 100* 204* 228* 100* 399*    Recent Results (from the past 240 hour(s))  CULTURE, BLOOD (ROUTINE X 2)     Status: None   Collection Time    07/22/2014  3:30 PM      Result Value Ref Range Status   Specimen Description BLOOD LEFT ARM   Final   Special Requests BOTTLES DRAWN  AEROBIC AND ANAEROBIC [facility]   Final   Culture  Setup Time     Final   Value: 07/28/2014 19:23     Performed at [facility]   Culture     Final   Value: ENTEROCOCCUS SPECIES     Note: SUSCEPTIBILITIES PERFORMED ON PREVIOUS CULTURE WITHIN THE LAST 5 DAYS.     Note: Gram Stain Report Called to,Read Back By and Verified With:  JAMES ARTIS 07/07/14 AT 0730 RIDK     Performed at [facility]   Report Status 07/09/2014 FINAL   Final  URINE CULTURE     Status: None   Collection Time    07/04/2014  4:18 PM      Result Value Ref Range Status   Specimen Description URINE, CLEAN CATCH   Final   Special Requests NONE   Final   Culture  Setup Time     Final   Value: 07/07/2014 02:46     Performed at [facility]     Final   Value: 35,000 COLONIES/ML     Performed at [facility]   Culture     Final   Value: ENTEROBACTER CLOACAE     Performed at [facility]   Report Status 07/09/2014 FINAL   Final   Organism ID, Bacteria ENTEROBACTER CLOACAE   Final  CULTURE, BLOOD (ROUTINE X 2)     Status: None   Collection Time    07/26/2014  4:30 PM      Result Value Ref Range Status   Specimen Description BLOOD R ARM   Final   Special Requests BOTTLES DRAWN AEROBIC AND ANAEROBIC [facility] E[facility]   Final   Culture  Setup Time     Final   Value: 07/18/2014 19:22     Performed at [facility]   Culture     Final   Value: ENTEROCOCCUS SPECIES     Note: Gram Stain Report Called to,Read Back By and Verified With: JAMES ARTIS 07/07/14 AT 0730 RIDK     Performed at [facility]   Report Status 07/09/2014 FINAL   Final   Organism ID, Bacteria ENTEROCOCCUS SPECIES   Final  MRSA PCR SCREENING     Status: None   Collection Time    07/20/2014  7:52 PM      Result Value Ref Range Status   MRSA by PCR NEGATIVE  NEGATIVE Final   Comment:            The GeneXpert MRSA Assay (FDA     approved for NASAL specimens     only), is one component of a      comprehensive MRSA colonization     surveillance program. It is not     intended to diagnose MRSA     infection nor to guide or     monitor treatment for     MRSA infections.  CULTURE, BLOOD (ROUTINE X 2)     Status: None   Collection Time    07/08/14 12:00 PM      Result Value Ref Range Status   Specimen Description BLOOD RIGHT HAND   Final   Special Requests BOTTLES DRAWN AEROBIC ONLY [facility]   Final   Culture  Setup Time     Final   Value: 07/08/2014 17:14     Performed at [facility]   Culture     Final   Value:        BLOOD CULTURE RECEIVED NO GROWTH TO DATE CULTURE WILL BE HELD FOR 5 DAYS BEFORE ISSUING A FINAL NEGATIVE REPORT     Performed at [facility]   Report Status PENDING   Incomplete  CULTURE, BLOOD (ROUTINE X 2)     Status: None   Collection Time    07/08/14 12:10 PM      Result Value Ref Range Status   Specimen Description BLOOD  LEFT HAND   Final   Special Requests BOTTLES DRAWN AEROBIC ONLY [facility]   Final   Culture  Setup Time     Final   Value: 07/08/2014 17:05     Performed at [facility]   Culture     Final   Value:        BLOOD CULTURE RECEIVED NO GROWTH TO DATE CULTURE WILL BE HELD FOR 5 DAYS BEFORE ISSUING A FINAL NEGATIVE REPORT     Performed at [facility]   Report Status PENDING   Incomplete     Studies: No results found.  Scheduled Meds: . sodium chloride   Intravenous Once  . ampicillin (OMNIPEN) IV  2 g Intravenous 4 times per day  . atorvastatin  10 mg Oral QPM  . furosemide  80 mg Oral Daily  . gentamicin  80 mg Intravenous Q24H  . insulin aspart  0-9 Units Subcutaneous TID WC  . pantoprazole  40 mg Oral Daily  . predniSONE  80 mg Oral Q breakfast  . pyridOXINE  100 mg Oral QPM  . tamsulosin  0.4 mg Oral QPC supper  . vitamin E  400 Units Oral Daily   Continuous Infusions:    Principal Problem:   Enterococcal bacteremia Active Problems:   CAD (coronary artery disease)   Anemia   Thrombocytopenia    Pacemaker-[employer]   Mitral regurgitation   Sepsis   TIA (transient ischemic attack)    Time spent: 25 minutes.     Niel Hummer A  [facility] Pager [phone]. If 7PM-7AM, please contact night-coverage at [email or web address], password [facility] 07/13/2014, 1:00 PM  LOS: 7 days

## 2014-07-14 ENCOUNTER — Encounter (HOSPITAL_COMMUNITY): Payer: Self-pay

## 2014-07-14 ENCOUNTER — Encounter (HOSPITAL_COMMUNITY)
Admission: EM | Disposition: E | Payer: Self-pay | Source: Home / Self Care | Attending: Thoracic Surgery (Cardiothoracic Vascular Surgery)

## 2014-07-14 DIAGNOSIS — T827XXA Infection and inflammatory reaction due to other cardiac and vascular devices, implants and grafts, initial encounter: Secondary | ICD-10-CM

## 2014-07-14 DIAGNOSIS — I359 Nonrheumatic aortic valve disorder, unspecified: Secondary | ICD-10-CM

## 2014-07-14 DIAGNOSIS — K3189 Other diseases of stomach and duodenum: Secondary | ICD-10-CM

## 2014-07-14 DIAGNOSIS — R1013 Epigastric pain: Secondary | ICD-10-CM

## 2014-07-14 DIAGNOSIS — I5023 Acute on chronic systolic (congestive) heart failure: Secondary | ICD-10-CM

## 2014-07-14 HISTORY — PX: LEAD REMOVAL: SHX5465

## 2014-07-14 HISTORY — PX: TEE WITHOUT CARDIOVERSION: SHX5443

## 2014-07-14 LAB — COMPREHENSIVE METABOLIC PANEL
ALBUMIN: 2.3 g/dL — AB (ref 3.5–5.2)
ALK PHOS: 62 U/L (ref 39–117)
ALT: 54 U/L — ABNORMAL HIGH (ref 0–53)
ANION GAP: 15 (ref 5–15)
AST: 56 U/L — ABNORMAL HIGH (ref 0–37)
BUN: 32 mg/dL — ABNORMAL HIGH (ref 6–23)
CALCIUM: 8.2 mg/dL — AB (ref 8.4–10.5)
CO2: 23 mEq/L (ref 19–32)
Chloride: 104 mEq/L (ref 96–112)
Creatinine, Ser: 1.36 mg/dL — ABNORMAL HIGH (ref 0.50–1.35)
GFR calc Af Amer: 55 mL/min — ABNORMAL LOW (ref >90)
GFR calc non Af Amer: 48 mL/min — ABNORMAL LOW (ref >90)
Glucose, Bld: 103 mg/dL — ABNORMAL HIGH (ref 70–99)
Potassium: 3.7 mEq/L (ref 3.7–5.3)
SODIUM: 142 meq/L (ref 137–147)
TOTAL PROTEIN: 6 g/dL (ref 6.0–8.3)
Total Bilirubin: 0.8 mg/dL (ref 0.3–1.2)

## 2014-07-14 LAB — CBC
HCT: 32.2 % — ABNORMAL LOW (ref 39.0–52.0)
HEMOGLOBIN: 10.4 g/dL — AB (ref 13.0–17.0)
MCH: 32.8 pg (ref 26.0–34.0)
MCHC: 32.3 g/dL (ref 30.0–36.0)
MCV: 101.6 fL — ABNORMAL HIGH (ref 78.0–100.0)
PLATELETS: 19 10*3/uL — AB (ref 150–400)
RBC: 3.17 MIL/uL — ABNORMAL LOW (ref 4.22–5.81)
RDW: 21.6 % — ABNORMAL HIGH (ref 11.5–15.5)
WBC: 11.4 10*3/uL — AB (ref 4.0–10.5)

## 2014-07-14 LAB — CULTURE, BLOOD (ROUTINE X 2)
Culture: NO GROWTH
Culture: NO GROWTH

## 2014-07-14 LAB — UIFE/LIGHT CHAINS/TP QN, 24-HR UR
ALBUMIN, U: DETECTED
ALPHA 2 UR: DETECTED — AB
Alpha 1, Urine: DETECTED — AB
BETA UR: DETECTED — AB
FREE LT CHN EXCR RATE: 69.2 mg/d
Free Kappa Lt Chains,Ur: 3.46 mg/dL — ABNORMAL HIGH (ref 0.14–2.42)
Free Kappa/Lambda Ratio: 3.53 ratio (ref 2.04–10.37)
Free Lambda Excretion/Day: 19.6 mg/d
Free Lambda Lt Chains,Ur: 0.98 mg/dL — ABNORMAL HIGH (ref 0.02–0.67)
Gamma Globulin, Urine: DETECTED — AB
TIME-UPE24: 24 h
Total Protein, Urine-Ur/day: 132 mg/d (ref 10–140)
Total Protein, Urine: 6.6 mg/dL
Volume, Urine: 2000 mL

## 2014-07-14 LAB — GLUCOSE, CAPILLARY
GLUCOSE-CAPILLARY: 118 mg/dL — AB (ref 70–99)
GLUCOSE-CAPILLARY: 133 mg/dL — AB (ref 70–99)
GLUCOSE-CAPILLARY: 218 mg/dL — AB (ref 70–99)
Glucose-Capillary: 127 mg/dL — ABNORMAL HIGH (ref 70–99)

## 2014-07-14 LAB — LACTATE DEHYDROGENASE: LDH: 666 U/L — ABNORMAL HIGH (ref 94–250)

## 2014-07-14 LAB — MRSA PCR SCREENING: MRSA by PCR: NEGATIVE

## 2014-07-14 SURGERY — LEAD REMOVAL
Anesthesia: LOCAL

## 2014-07-14 SURGERY — REMOVAL, ELECTRODE LEAD, CARDIAC PACEMAKER, WITHOUT REPLACEMENT
Anesthesia: General | Site: Chest | Laterality: Left

## 2014-07-14 SURGERY — ECHOCARDIOGRAM, TRANSESOPHAGEAL
Anesthesia: Moderate Sedation

## 2014-07-14 MED ORDER — CHLORHEXIDINE GLUCONATE 4 % EX LIQD
60.0000 mL | Freq: Once | CUTANEOUS | Status: AC
  Administered 2014-07-14: 4 via TOPICAL
  Filled 2014-07-14 (×2): qty 60

## 2014-07-14 MED ORDER — BUTAMBEN-TETRACAINE-BENZOCAINE 2-2-14 % EX AERO
INHALATION_SPRAY | CUTANEOUS | Status: DC | PRN
  Administered 2014-07-14: 2 via TOPICAL

## 2014-07-14 MED ORDER — MIDAZOLAM HCL 5 MG/ML IJ SOLN
INTRAMUSCULAR | Status: AC
  Filled 2014-07-14: qty 2

## 2014-07-14 MED ORDER — FENTANYL CITRATE 0.05 MG/ML IJ SOLN
INTRAMUSCULAR | Status: AC
  Filled 2014-07-14: qty 2

## 2014-07-14 MED ORDER — CHLORHEXIDINE GLUCONATE 4 % EX LIQD
60.0000 mL | Freq: Once | CUTANEOUS | Status: AC
  Administered 2014-07-14: 4 via TOPICAL
  Filled 2014-07-14: qty 60

## 2014-07-14 MED ORDER — ROMIPLOSTIM 250 MCG ~~LOC~~ SOLR
2.0000 ug/kg | Freq: Once | SUBCUTANEOUS | Status: AC
  Administered 2014-07-14: 175 ug via SUBCUTANEOUS
  Filled 2014-07-14: qty 0.35

## 2014-07-14 MED ORDER — SODIUM CHLORIDE 0.9 % IV SOLN: INTRAVENOUS | Status: DC

## 2014-07-14 MED ORDER — MIDAZOLAM HCL 5 MG/5ML IJ SOLN
INTRAMUSCULAR | Status: AC
Start: 2014-07-14 — End: 2014-07-14
  Filled 2014-07-14: qty 5

## 2014-07-14 MED ORDER — SODIUM CHLORIDE 0.9 % IV SOLN
40.0000 mg | Freq: Once | INTRAVENOUS | Status: AC
  Administered 2014-07-14: 40 mg via INTRAVENOUS
  Filled 2014-07-14: qty 4

## 2014-07-14 MED ORDER — ACETAMINOPHEN 325 MG PO TABS: 325.0000 mg | ORAL_TABLET | ORAL | Status: DC | PRN

## 2014-07-14 MED ORDER — LIDOCAINE HCL (PF) 1 % IJ SOLN
INTRAMUSCULAR | Status: AC
Start: 2014-07-14 — End: 2014-07-14
  Filled 2014-07-14: qty 60

## 2014-07-14 MED ORDER — SODIUM CHLORIDE 0.9 % IR SOLN
80.0000 mg | Status: DC
  Filled 2014-07-14: qty 2

## 2014-07-14 MED ORDER — ONDANSETRON HCL 4 MG/2ML IJ SOLN: 4.0000 mg | Freq: Four times a day (QID) | INTRAMUSCULAR | Status: DC | PRN

## 2014-07-14 MED ORDER — FENTANYL CITRATE 0.05 MG/ML IJ SOLN
INTRAMUSCULAR | Status: DC | PRN
  Administered 2014-07-14: 25 ug via INTRAVENOUS

## 2014-07-14 MED ORDER — MIDAZOLAM HCL 10 MG/2ML IJ SOLN
INTRAMUSCULAR | Status: DC | PRN
  Administered 2014-07-14: 2 mg via INTRAVENOUS

## 2014-07-14 MED ORDER — HEPARIN (PORCINE) IN NACL 2-0.9 UNIT/ML-% IJ SOLN
INTRAMUSCULAR | Status: AC
  Filled 2014-07-14: qty 500

## 2014-07-14 SURGICAL SUPPLY — 30 items
BAG BANDED W/RUBBER/TAPE 36X54 (MISCELLANEOUS) ×3 IMPLANT
BLADE 10 SAFETY STRL DISP (BLADE) ×3 IMPLANT
BLADE STERNUM SYSTEM 6 (BLADE) ×3 IMPLANT
BNDG COHESIVE 4X5 WHT NS (GAUZE/BANDAGES/DRESSINGS) IMPLANT
CANISTER SUCTION 2500CC (MISCELLANEOUS) ×3 IMPLANT
COVER TABLE BACK 60X90 (DRAPES) ×3 IMPLANT
DRAPE CARDIOVASCULAR INCISE (DRAPES) ×2
DRAPE SRG 135X102X78XABS (DRAPES) ×1 IMPLANT
DRSG OPSITE 6X11 MED (GAUZE/BANDAGES/DRESSINGS) IMPLANT
ELECT REM PT RETURN 9FT ADLT (ELECTROSURGICAL) ×6
ELECTRODE REM PT RTRN 9FT ADLT (ELECTROSURGICAL) ×2 IMPLANT
GAUZE SPONGE 4X4 12PLY STRL (GAUZE/BANDAGES/DRESSINGS) IMPLANT
GAUZE SPONGE 4X4 16PLY XRAY LF (GAUZE/BANDAGES/DRESSINGS) IMPLANT
GLOVE BIOGEL PI IND STRL 7.5 (GLOVE) ×1 IMPLANT
GLOVE BIOGEL PI INDICATOR 7.5 (GLOVE) ×2
GLOVE ECLIPSE 8.0 STRL XLNG CF (GLOVE) ×3 IMPLANT
GOWN STRL REUS W/ TWL LRG LVL3 (GOWN DISPOSABLE) IMPLANT
GOWN STRL REUS W/ TWL XL LVL3 (GOWN DISPOSABLE) ×1 IMPLANT
GOWN STRL REUS W/TWL LRG LVL3 (GOWN DISPOSABLE)
GOWN STRL REUS W/TWL XL LVL3 (GOWN DISPOSABLE) ×2
KIT ROOM TURNOVER OR (KITS) ×3 IMPLANT
PAD ARMBOARD 7.5X6 YLW CONV (MISCELLANEOUS) ×6 IMPLANT
PAD ELECT DEFIB RADIOL ZOLL (MISCELLANEOUS) ×3 IMPLANT
SUT PROLENE 2 0 SH DA (SUTURE) IMPLANT
TOWEL OR 17X24 6PK STRL BLUE (TOWEL DISPOSABLE) ×6 IMPLANT
TOWEL OR 17X26 10 PK STRL BLUE (TOWEL DISPOSABLE) ×6 IMPLANT
TRAY FOLEY IC TEMP SENS 14FR (CATHETERS) ×6 IMPLANT
TUBE CONNECTING 12'X1/4 (SUCTIONS)
TUBE CONNECTING 12X1/4 (SUCTIONS) IMPLANT
YANKAUER SUCT BULB TIP NO VENT (SUCTIONS) IMPLANT

## 2014-07-14 NOTE — Progress Notes (Signed)
  Echocardiogram Echocardiogram Transesophageal has been performed.  Mauricio Po 07/12/2014, 9:49 AM

## 2014-07-14 NOTE — Progress Notes (Signed)
PT Cancellation Note  Patient Details Name: Justin [name] MRN: [id number] DOB: Apr 27, 1933   Cancelled Treatment:    Reason Eval/Treat Not Completed: Patient at procedure or test/unavailable. Pt for TEE this morning. Will continue to follow and follow up tomorrow.   Jolyn Lent 07/13/2014, 9:27 AM  Jolyn Lent, PT, DPT Acute [facility] Services Pager: [phone]

## 2014-07-14 NOTE — Progress Notes (Signed)
Justin [name] is doing well this morning. He will be going for his TEE this morning. Platelets have been ordered. I will make sure that he gets adequate premedication.  There are no labs back yet. I will go ahead and give him a dose of Nplate.  He did well with physical therapy yesterday.  His appetite continues to be good. He said no nausea or vomiting. He had a little bit of indigestion this morning.  He continues on the ampicillin and gentamicin for the enterococcus.  He says his also given the pacemaker out today.  His vital signs all looked good. Blood pressure is 146/52. Temperature is 97.8. Pulse is 72 and regular. His oral exam shows no thrush. Lungs are clear. Cardiac exam regular in rhythm. His 1/6 systolic ejection murmur. Abdomen is soft. Has good bowel sounds. There is no fluid wave. There is no palpable liver or spleen tip. Extremities shows no clubbing cyanosis or edema. Skin exam shows no ecchymoses or petechia.  I would like to think that he would be able to go home soon. He will need outpatient antibiotics. He will need to have a PICC line placed. This,, I am sure, can be done in the next day or so.  It will be very interesting to see how his blood counts look. Hopefully we will see a trend going up toward this week.  [place] 55:22

## 2014-07-14 NOTE — Progress Notes (Signed)
Pt returned from endoscopy via bed. Pt alert and oriented x 4. Pt denies pain or concerns. Family at bedside. Report received from [facility]. Call bell in reach. Will continue to monitor pt closely.  Eulis Canner, RN

## 2014-07-14 NOTE — Consult Note (Signed)
[address]       [place] [address]             [phone]        Lambros G Kemmerer [place] Medical Record #[id number] Date of Birth: 01-27-1933  Referring: No ref. provider found Primary Care: Penni Homans, MD  Chief Complaint:    Chief Complaint  Patient presents with  . 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 The patient has been been ill this past summer. He initially noted dark stool and underwent colonoscopy which demonstrated some small polyps which were removed. Upper camera endoscopy was unremarkable. The patient then developed symptoms of shortness of breath and productive cough and was given a course of antibiotics. His appetite worsened and he developed intermittent fevers, weight loss, then progressive to severe thrombocytopenia. A bone marrow biopsy was performed which was unremarkable. He was admitted to the ICU from the hematologist's office where he was being treated for immune mediated thrombocytopenia with platelet count less than 20,000. On admission blood cultures were positive for enterococcus and he has been on ampicillin gentamicin and followed by ID. A chest wall echocardiogram showed his chronic MR. CT scan of the chest and abdomen were fairly unremarkable and negative for PE. Negative for pneumonia. The patient had no dental symptoms or dental procedures performed earlier in the summer. No urinary tract symptoms or previous UTI.  Today a TEE demonstrated a well-defined vegetation of the aortic valve with moderate AI. His MR  appeared to be more severe as well. He has mild LV dilatation. No significant pericardial effusion or TR.  The patient has had 2 neurologic events during this hospitalization. Approximately 48 hours ago he had some transient visual symptoms which lasted a few hours described as floating dark spots. Head CT scan was negative. Earlier today he had transient brief clumsiness or weakness of his right hand which is resolved. The patient has been evaluated by hematology. He cannot have a brain MRI because of his pacemaker. His antiplatelet therapy has been held due to severe thrombocytopenia.  The patient previously underwent acute PCI with stent for a MI in 1999  The patient is scheduled for removal of his contaminated pacemaker system today by Dr. Lovena Le in the cath lab  Since admission and starting on IV antibiotics the patient is clinically improved. He is able to walk up and down the hallway. His appetite is excellent. His albumin levels however a low at 2.6. He continues on oral steroids-prednisone 80 mg daily. The patient had previously been in excellent health and very active and healthy conscious prior to the events of this summer.  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   Zubrod Score: At the time of surgery this patient's most appropriate activity status/level should be described as: _0     0    Normal activity, no symptoms _1     1    Restricted in physical strenuous activity but ambulatory, able to do out light work _2     2    Ambulatory  and capable of self care, unable to do work activities, up and about                 more than 50%  Of the time                            _0     3    Only limited self care, in bed greater than 50% of waking hours _1     4    Completely disabled, no self care, confined to bed or chair _2     5    Moribund  Past Medical History  Diagnosis Date  . CAD (coronary artery disease) prior stenting 1999   .  Dyslipidemia   . HTN (hypertension)   . WPW (Wolff-Parkinson-White syndrome) loss of preexcitation   . Atrial fibrillation   . AV block, Mobitz II   . Presyncope   . Myocardial infarction   . Pacemaker 04/07/2013  . Arthritis   . History of chicken pox   . Anemia   . Hypernatremia 06/03/2013  . Thrombocytopenia, unspecified 06/03/2013  . Leukopenia 06/03/2013  . Obstructive sleep apnea 06/03/2013    USES CPAP  . Urinary incontinence 06/03/2013  . Hyperglycemia 12/21/2013  . Pancytopenia 06/03/2013  . Iron deficiency anemia, unspecified 07/02/2014  . Malabsorption of iron 07/02/2014  . Hypotestosteronemia 07/02/2014  . ITP (idiopathic thrombocytopenic purpura) 07/02/2014    Past Surgical History  Procedure Laterality Date  . Coronary angioplasty with stent placement    . Vastectomy    . Insert / replace / remove pacemaker  04/07/2013    History  Smoking status  . Former Smoker -- 1.00 packs/day for 36 years  . Types: Cigarettes  . Start date: 01/25/1955  . Quit date: 12/05/1991  Smokeless tobacco  . Never Used    Comment: quit smoking 22 yeras ago    History  Alcohol Use  . Yes    Comment: Occasional    History   Social History  . Marital Status: Married    Spouse Name: N/A    Number of Children: 3  . Years of Education: N/A   Occupational History  .      Retired   Social History Main Topics  . Smoking status: Former Smoker -- 1.00 packs/day for 36 years    Types: Cigarettes    Start date: 01/25/1955    Quit date: 12/05/1991  . Smokeless tobacco: Never Used     Comment: quit smoking 22 yeras ago  . Alcohol Use: Yes     Comment: Occasional  . Drug Use: No  . Sexual Activity: Not on file   Other Topics Concern  . Not on file   Social History Narrative  . No narrative on file    No Known Allergies  Current Facility-Administered Medications  Medication Dose Route Frequency Provider Last Rate Last Dose  . 0.9 %  sodium chloride infusion  250 mL Intravenous  PRN Corey Harold, NP      . 0.9 %  sodium chloride infusion   Intravenous Once Volanda Napoleon, MD      . 0.9 %  sodium chloride infusion   Intravenous Continuous Lelon Perla, MD 20 mL/hr at 07/20/2014 0505    . 0.9 %  sodium chloride infusion   Intravenous Once Brittainy Simmons, PA-C      . 0.9 %  sodium chloride infusion   Intravenous Continuous Denice Bors  Stanford Breed, MD      . acetaminophen (TYLENOL) tablet 650 mg  650 mg Oral Q4H PRN Corey Harold, NP      . ampicillin (OMNIPEN) 2 g in sodium chloride 0.9 % 50 mL IVPB  2 g Intravenous 4 times per day Truman Hayward, MD   2 g at 07/11/2014 1316  . atorvastatin (LIPITOR) tablet 10 mg  10 mg Oral QPM Belkys A Regalado, MD   10 mg at 07/13/14 1810  . furosemide (LASIX) tablet 80 mg  80 mg Oral Daily Volanda Napoleon, MD   80 mg at 07/13/14 0959  . gentamicin (GARAMYCIN) 80 mg in sodium chloride irrigation 0.9 % 500 mL irrigation  80 mg Irrigation On Call Evans Lance, MD      . gentamicin (GARAMYCIN) IVPB 80 mg  80 mg Intravenous Q24H Dareen Piano, RPH   80 mg at 08/01/2014 1400  . insulin aspart (novoLOG) injection 0-9 Units  0-9 Units Subcutaneous TID WC Belkys A Regalado, MD   1 Units at 07/11/2014 1323  . lidocaine-prilocaine (EMLA) cream 1 application  1 application Topical Once E. Annye Asa, MD      . pantoprazole (PROTONIX) EC tablet 40 mg  40 mg Oral Daily Corey Harold, NP   40 mg at 07/13/14 0959  . polyvinyl alcohol (LIQUIFILM TEARS) 1.4 % ophthalmic solution 1 drop  1 drop Both Eyes PRN Vishal Mungal, MD      . predniSONE (DELTASONE) tablet 80 mg  80 mg Oral Q breakfast Volanda Napoleon, MD   80 mg at 07/05/2014 6606  . pyridOXINE (VITAMIN B-6) tablet 100 mg  100 mg Oral QPM Corey Harold, NP   100 mg at 07/13/14 1810  . tamsulosin (FLOMAX) capsule 0.4 mg  0.4 mg Oral QPC supper Vishal Mungal, MD   0.4 mg at 07/13/14 1810  . vitamin E capsule 400 Units  400 Units Oral Daily Marijean Heath, NP   400 Units at 07/13/14  1000    Prescriptions prior to admission  Medication Sig Dispense Refill  . atorvastatin (LIPITOR) 10 MG tablet Take 1 tablet (10 mg total) by mouth every evening.  90 tablet  3  . Azelaic Acid (FINACEA) 15 % cream Apply 1 application topically daily. After skin is thoroughly washed and patted dry, gently but thoroughly massage a thin film of azelaic acid creame      . Cholecalciferol (VITAMIN D-3) 1000 UNITS CAPS Take 1 capsule by mouth daily.      . furosemide (LASIX) 20 MG tablet Take 3 tablets (60 mg total) by mouth daily.  90 tablet  12  . losartan (COZAAR) 50 MG tablet Take 1 tablet (50 mg total) by mouth daily.  90 tablet  3  . Multiple Vitamin (MULTIVITAMIN WITH MINERALS) TABS Take 1 tablet by mouth 2 (two) times a week.       . nadolol (CORGARD) 20 MG tablet Take 10 mg by mouth daily.      . nitroGLYCERIN (NITROSTAT) 0.4 MG SL tablet Place 1 tablet (0.4 mg total) under the tongue every 5 (five) minutes as needed for chest pain. x3 doses as needed for chest pain  25 tablet  12  . omeprazole (PRILOSEC) 20 MG capsule Take 20 mg by mouth daily as needed (for heartburn).       Vladimir Faster Glycol-Propyl Glycol (SYSTANE ULTRA OP) Apply 1 drop to eye 4 (four) times daily as needed (for dry eyes).      Marland Kitchen  predniSONE (DELTASONE) 20 MG tablet Take 80 mg by mouth daily with breakfast.      . Probiotic Product (PROBIOTIC DAILY PO) Take 1 capsule by mouth daily.       Marland Kitchen pyridOXINE (VITAMIN B-6) 100 MG tablet Take 50 mg by mouth every evening.       . tamsulosin (FLOMAX) 0.4 MG CAPS capsule Take 0.4 mg by mouth at bedtime.      . vitamin B-12 (CYANOCOBALAMIN) 1000 MCG tablet Take 1,000 mcg by mouth every evening.      . Vitamin E (VITA-PLUS E PO) Take 1 capsule by mouth daily.        Family History  Problem Relation Age of Onset  . Stroke Mother   . Hypertension Mother   . Stroke Sister   . Arthritis Sister     rheumatoid  . Heart attack Sister   . Anemia Sister   . Other Brother     tube  put in aorta  . Anemia Brother   . Other Son     cortisone deficiency  . Arthritis Son   . Stroke Brother   . Alcohol abuse Brother   . Barrett's esophagus Son   . Colon cancer Maternal Grandmother   . Colon cancer Maternal Grandfather      Review of Systems:     Cardiac Review of Systems: Y or N  Chest Pain [  no   ]  Resting SOB [ yes    ] Exertional yes  SOB  [ yes   ]  Orthopnea [ no   ]   Pedal Edema [ yes   ]    Palpitations [ yes no   ] Syncope  [  ]   Presyncope [  yes-remote from rapid atrial fibrillation   ]  General Review of Systems: [Y] = yes [  ]=no Constitional: recent weight change [email or web address]   ]; anorexia [   yes ]; fatigue [ yes   ]; nausea [  ]; night sweats [  ]; fever [ yes  ]; or chills [  ]                                                               Dental: poor dentition[  no  ]; Last Dentist visit: every 6 months, no active problems    Eye : blurred vision [  ]; diplopia [   ]; vision changes [  yes during this hospitalization  ];  Amaurosis fugax[  ]; Resp: cough [  ];  wheezing[  ];  hemoptysis[  ]; shortness of breath[  ]; paroxysmal nocturnal dyspnea[  ]; dyspnea on exertion[  ]; or orthopnea[  ];  GI:  gallstones[  ], vomiting[  ];  dysphagia[  ]; melena[  ];  hematochezia [  ]; heartburn[  ];   Hx of  Colonoscopy[   yes earlier this summer ]; GU: kidney stones [  ]; hematuria[  ];   dysuria [  ];  nocturia[  ];  history of     obstruction [  ]; urinary frequency [  ]             Skin: rash, swelling[  ];, hair loss[  ];  peripheral edema[  ];  or  itching[  ]; Musculosketetal: myalgias[  ];  joint swelling[  ];  joint erythema[  ];  joint pain[  ];  back pain[  ];  Heme/Lymph: bruising[  ];  bleeding[  ];  anemia[ yes followed by hematology  ];  Neuro: TIA[  ];  headaches[  ];  stroke[  ];  vertigo[  ];  seizures[  ];   paresthesias[  ];  difficulty walking[  ];  Psych:depression[  ]; anxiety[  ];  Endocrine: diabetes[  ];  thyroid dysfunction[   ];  Immunizations: Flu [  ]; Pneumococcal[  ];  Other: no history of prior thoracic trauma or thoracic surgery, no clinical significant bleeding symptoms  Physical Exam: BP 167/53  Pulse 79  Temp(Src) 98.9 F (37.2 C) (Oral)  Resp 18  Ht _0  (1.803 m)  Wt 195 lb 5.2 oz (88.6 kg)  BMI 27.25 kg/m2  SpO2 99%  General appearance early Caucasian male who appears younger than his stated age despite his subacute illness   HEENT normocephalic pupils equal dentition good   neck without JVD mass or adenopathy [id number]-0/ 6 holosystolic murmur, regular rhythm-paced Thorax-diminished breath sounds at bases Abdomen-no organomegaly tenderness or pulsatile mass Extremities-2+ ankle edema no cyanosis clubbing or tenderness Vascular-peripheral pulses intact no significant venous insufficiency of the lower extremities Neurologic-right-hand dominant no focal motor deficit alert cranial nerves grossly intact  Diagnostic Studies & Laboratory data:   echocardiogram, TEE, microbiologic data reviewed   Recent Radiology Findings:   CT scans chest x-ray reviewed, orthopantogram pending    Recent Lab Findings: Lab Results  Component Value Date   WBC 11.4* 07/26/2014   HGB 10.4* 07/10/2014   HCT 32.2* 08/03/2014   PLT 19* 07/10/2014   GLUCOSE 103* 07/16/2014   CHOL 126 01/13/2014   TRIG 66 01/13/2014   HDL 49 01/13/2014   LDLCALC 64 01/13/2014   ALT 54* 07/26/2014   AST 56* 07/25/2014   NA 142 07/08/2014   K 3.7 07/07/2014   CL 104 07/04/2014   CREATININE 1.36* 08/02/2014   BUN 32* 07/21/2014   CO2 23 07/29/2014   TSH 2.450 07/10/2014   INR 1.48 08/02/2014   HGBA1C 5.6 07/12/2014      Assessment / Plan:      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 No clear evidence of vegetations on the mitral valve which has a restricted posterior leaflet. He has symptoms of clinical CHF with  interstitial edema and ankle edema. His most recent cultures have been cleared and his permanent pacemaker hardware is being removed today.    the patient would benefit from aortic valve replacement and mitral valve repair/replacement after further antibiotic therapy following removal of his pacemaker hardware. Prior to valve surgery he would need repeat coronary angiogram.  He would also need to have significantly improved platelet count prior to heart valve surgery and we will follow his response to antibiotics and prednisone.  The patient should  remain in the hospital due to complexities of his primary disease-endocarditis with potential for acute decompensation-and to follow severe thrombocytopenia which hopefully will respond to current therapy.  Will order orthopantogram and request dental evaluation if needed.     _1 @ 07/18/2014 4:27 PM

## 2014-07-14 NOTE — Progress Notes (Signed)
Stroke Team Progress Note  HISTORY Justin [name] is an 80 y.o. male who was initially brought to hospital for dyspnea. He is currently being followed by Dr. Marin Olp for ITP and treated with systemic steroids and was treated with IVIG. While in hospital he was noted to have both enterococcus and Enterobacter in his urine and blood. ID has been following. No obvious source of bacteremia has been found. This AM he went to pick up the top to the dish and noted he had a hard time grasping and feeling the lid with his right hand. He states "this only lasted for a few seconds" then resolved. "If he was not in the hospital he probably would have not thought anything about it". Currently he is back to his baseline and feels fine. Cardiology was consulted for possible worsening of MR. They have reviewed the echo and feels it has not worsened. Patient was to get a TEE for possibility Of endocarditis but due to PLT of 6 this is going to be postponed.  Note --Patient has pacer and cannot obtain MRI  Patient was not administered TPA secondary to low platelets. He was admitted to the medical floor for further evaluation and treatment.  SUBJECTIVE  .  Overall he feels his condition is stable.  TEE   Done shows large aortic valve vegetation but pacer wire not infected.  he has seen Dr Stanford Breed and will likely need CVTS  consult OBJECTIVE Most recent Vital Signs: Filed Vitals:   07/07/2014 1043 07/30/2014 1100 07/09/2014 1115 08/03/2014 1130  BP: 142/52 140/48 143/49 135/50  Pulse: 67     Temp:      TempSrc:      Resp: 16     Height:      Weight:      SpO2: 94%      CBG (last 3)   Recent Labs  07/13/14 1627 07/13/14 2113 07/09/2014 0603  GLUCAP 201* 200* 118*    IV Fluid Intake:   . sodium chloride 20 mL/hr at 07/04/2014 0505  . sodium chloride      MEDICATIONS  . sodium chloride   Intravenous Once  . sodium chloride   Intravenous Once  . ampicillin (OMNIPEN) IV  2 g Intravenous 4 times per day  .  atorvastatin  10 mg Oral QPM  . chlorhexidine  60 mL Topical Once  . chlorhexidine  60 mL Topical Once  . furosemide  80 mg Oral Daily  . gentamicin irrigation  80 mg Irrigation On Call  . gentamicin  80 mg Intravenous Q24H  . insulin aspart  0-9 Units Subcutaneous TID WC  . lidocaine-prilocaine  1 application Topical Once  . pantoprazole  40 mg Oral Daily  . predniSONE  80 mg Oral Q breakfast  . pyridOXINE  100 mg Oral QPM  . tamsulosin  0.4 mg Oral QPC supper  . vitamin E  400 Units Oral Daily   PRN:  sodium chloride, acetaminophen, polyvinyl alcohol  Diet:  NPO   Activity:  Bedrest  DVT Prophylaxis:  SCds CLINICALLY SIGNIFICANT STUDIES Basic Metabolic Panel:   Recent Labs Lab 07/13/14 0415 07/29/2014 0537  NA 142 142  K 3.5* 3.7  CL 104 104  CO2 24 23  GLUCOSE 109* 103*  BUN 35* 32*  CREATININE 1.50* 1.36*  CALCIUM 8.1* 8.2*   Liver Function Tests:   Recent Labs Lab 07/13/14 0415 07/20/2014 0537  AST 71* 56*  ALT 60* 54*  ALKPHOS 61 62  BILITOT 0.9  0.8  PROT 6.0 6.0  ALBUMIN 2.3* 2.3*   CBC:  Recent Labs Lab 07/08/14 0235  07/13/14 0415 07/18/2014 0537  WBC 19.7*  < > 11.1* 11.4*  NEUTROABS 18.0*  --   --   --   HGB 9.6*  < > 9.6* 10.4*  HCT 28.3*  < > 28.9* 32.2*  MCV 93.1  < > 98.0 101.6*  PLT 8*  < > 18* 19*  < > = values in this interval not displayed. Coagulation:  No results found for this basename: LABPROT, INR,  in the last 168 hours Cardiac Enzymes:  No results found for this basename: CKTOTAL, CKMB, CKMBINDEX, TROPONINI,  in the last 168 hours Urinalysis:  No results found for this basename: COLORURINE, APPERANCEUR, LABSPEC, PHURINE, GLUCOSEU, HGBUR, BILIRUBINUR, KETONESUR, PROTEINUR, UROBILINOGEN, NITRITE, LEUKOCYTESUR,  in the last 168 hours Lipid Panel    Component Value Date/Time   CHOL 126 01/13/2014 0849   TRIG 66 01/13/2014 0849   HDL 49 01/13/2014 0849   CHOLHDL 2.6 01/13/2014 0849   VLDL 13 01/13/2014 0849   LDLCALC 64 01/13/2014  0849   HgbA1C  Lab Results  Component Value Date   HGBA1C 5.6 07/12/2014    Urine Drug Screen:   No results found for this basename: labopia,  cocainscrnur,  labbenz,  amphetmu,  thcu,  labbarb    Alcohol Level: No results found for this basename: ETH,  in the last 168 hours  No results found.      MRI of the brain  pacer  MRA of the brain  pacer  Carotid Doppler  Preliminary findings: Bilateral: 1-39% ICA stenosis. Vertebral artery flow is antegrade.  Right subclavian artery demonstrates significant stenosis compared to left subclavian artery.      2D Echocardiogram  Systolic function was normal. The estimated ejection fraction was in the range of 50% to 55%. Wall motion was normal; there were no regional wall motion abnormalities.    CXR  07/07/2014 Persistent changes of congestive heart failure pulmonary edema.  Small pleural effusions   EKG 07/13/2014 Atrial-sensed ventricular-paced rhythm Therapy Recommendations  None as TIA  Physical Exam  pleasant elderly caucasian male not in distress.Awake alert. Afebrile. Head is nontraumatic. Neck is supple without bruit. Hearing is normal. Cardiac exam no murmur or gallop. Lungs are clear to auscultation. Distal pulses are well felt. Neurological Exam ;  Awake  Alert oriented x 3. Normal speech and language.eye movements full without nystagmus.fundi were not visualized. Vision acuity and fields appear normal. Hearing is normal. Palatal movements are normal. Face symmetric. Tongue midline. Normal strength, tone, reflexes and coordination. Normal sensation. Gait deferred. ASSESSMENT Justin [name] is a 80 y.o. male presenting with  Transient right hand weakness and numbness likely from left brain TIA.  Etiology to be determined. Possiblities include endocarditis given enterococcus bacteremia but stroke w/u pending.  On no antiplatelets prior to admission. Now on no antiplatelet for secondary stroke prevention. Patient with resultant   No deficits.. Stroke work up underway.   Enterococcus endocarditis with aortic valve vegetation  LDL 64  HbA1c 5.5  Rt subclavian artery stenosis/occlusion -asymptomatic. Medical management for now   Hospital day # 8  TREATMENT/PLAN  Agree to hold antiplatelts for secondary stroke prevention until platelet count greater than 50,000  CVTS consult for aortic valve replacement but patient is high risky  D/W patient   and Dr Tyrell Antonio   Antony Contras, MD Medical Director [place] Pager: [phone] 07/07/2014 12:08 PM  To contact Stroke Continuity provider, please refer to [email or web address] After hours, contact General Neurology

## 2014-07-14 NOTE — Progress Notes (Signed)
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 Hypotensive requiring vasopressors. Septic shock. He was found to have enterococcus bacteremia. His has worsening of his chronic thrombocytopenia with platelet level during this admission at 10 K. He had TIA episode on IV antibiotics. Neurology and ID recommending  TEE. He received platelet transfusion prior to TEE.  He underwent TEE 8-11 which show large  aortic valve vegetation. 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 It was also recommend to have pacemaker removed. Cardiothoracic surgeon consulted for further evaluation.   Enterococcus Bacteremia with Aortic Valve Vegetation: Received IV vancomycin for 4 days. Repeated Blood Culture 8-5 no growth to date.  WBC trending down. CT abdomen  no mas. -Will hold on placement of PICC line for now until infection better controlled. Will follow ID recommendation to when to order PICC line. . At time of PICC line placement will need to order double lumen per Dr Marin Olp request.  -Vancomycin change to ampicillin 8-8.Start gentamycin 8-9.  -Day 3 Ampicillin and gentamycin.  -S/P TEE which showed Large Aortic Valve Vegetation.   -HIV non reactive, viral hepatitis negative.  .  -Total Days of antibiotics: 8.   Transient Hallucination 8-6: Resolved. Related to medications, infections, delirium. CT head was negative.   Transient hand weakness, numbness.  TIA, Stroke. Multiple risk factors for TIA, Stroke.  -Carotid Duplex (Doppler) has been completed. Preliminary findings: Bilateral: 1-39% ICA stenosis. Vertebral artery flow is antegrade. Right subclavian artery demonstrates significant stenosis compared to left subclavian artery. Medical management for  subclavia artery stenosis.  -Hb-A1 C: 5.6 -Continue with Lipitor. Follow LFT.  -TEE showed Aortic valve vegetation.   Possible pneumonia vs TRALI from IVIG  Bilateral effusions - stable  Diarrhea; Resolved.  Elevated troponin: in setting sepsis. ECHO; normal Wall motion. Cardiology to following. High risk for anticoagulation.   Enterobacter Urine: 30,000 colonies.   Chronic diastolic heart failure - on lasix at home Restarted lasix (80 mg ) on 8-5.  Dyspnea improving.   Sepsis: Enterococcus bacteremia. Received pressors (Levophed). BP stable. On antibiotics.   AKI:in setting of infection. Monitor on Lasix. Good urine out put. Follow cr trend.   Thrombocytopenia, ITP, first IVIG treatment 8/3, on chronic prednisone. Platelet increase to  18. Dr Marin Olp following. Continue with prednisone 80 mg daily. Received one dose of Nplate.  Anemia; received Epogen 8-8. Urine electrophoresis pending.   Suspected Adrenal insufficiency ( chronic steroids )  Continue with  prednisone.   H/o PAF, now in SR   Code Status: Partial: yes to antiarrythmic and vasopressors.   Family Communication: care discussed with Daughter Disposition Plan: home when stable.    Consultants:  ID  Cardiology.  neurology   Procedures: 6/24 TTE >>> LVEF 55%, Grade 2 DD  8/3 CT chest >>> bilateral effusions, bilateral GGO, several nodular opacities  8/4 ECHO >>> Mild to mod MR, EF 50-55%, G2 Dia Dys Fcn TEE: normal LV function; severe MR; large aortic valve vegetation with moderate AI; no pacemaker vegetation identified   Antibiotics:  Vancomycin 8-3---8-8  Ampicillin. 8-9  Gentamycin 8-9  HPI/Subjective: He is alert, no new complaints, he is concern with TEE finding. Waiting for surgical evaluation.   Objective: Filed Vitals:   07/18/2014 1455  BP: 167/53  Pulse: 79  Temp: 98.9 F (37.2 C)  Resp: 18    Intake/Output Summary (Last 24 hours) at 08/02/2014 1637 Last data filed at 07/13/2014  1122  Gross per 24 hour  Intake 947.33 ml  Output   2150 ml  Net -1202.67 ml   Filed Weights   07/12/14 0436 07/13/14 0457 07/31/2014 0615  Weight: 87.4 kg (192 lb 10.9 oz) 88.179 kg (194 lb 6.4 oz) 88.6 kg (195 lb 5.2 oz)    Exam:   General:  No distress.   Cardiovascular: S 1, S 2 IRR. JVD.   Respiratory: decrease breath sound bases, B/L crackles.   Abdomen: Bs present, soft, NT  Musculoskeletal: plus 2 edema LE.   Neuro exam ; non focal.   Data Reviewed: Basic Metabolic Panel:  Recent Labs Lab 07/10/14 0410 07/11/14 0345 07/12/14 0430 07/13/14 0415 07/09/2014 0537  NA 139 141 141 142 142  K 3.4* 3.7 3.6* 3.5* 3.7  CL 104 105 105 104 104  CO2 23 21 25 24 23   GLUCOSE 135* 137* 109* 109* 103*  BUN 38* 39* 38* 35* 32*  CREATININE 1.53* 1.43* 1.56* 1.50* 1.36*  CALCIUM 8.2* 8.3* 8.4 8.1* 8.2*   Liver Function Tests:  Recent Labs Lab 07/10/14 0410 07/11/14 0345 07/12/14 0430 07/13/14 0415 07/10/2014 0537  AST 54* 56* 69* 71* 56*  ALT 49 47 53 60* 54*  ALKPHOS 64 68 56 61 62  BILITOT 1.1 0.9 0.8 0.9 0.8  PROT 5.9* 5.9* 5.8* 6.0 6.0  ALBUMIN 2.1* 2.1* 2.2* 2.3* 2.3*   No results found for this basename: LIPASE, AMYLASE,  in the last 168 hours No results found for this basename: AMMONIA,  in the last 168 hours CBC:  Recent Labs Lab 07/08/14 0235  07/10/14 0410 07/11/14 0345 07/12/14 0430 07/13/14 0415 07/13/2014 0537  WBC 19.7*  < > 10.6* 12.1* 12.0* 11.1* 11.4*  NEUTROABS 18.0*  --   --   --   --   --   --   HGB 9.6*  < > 9.7* 9.4* 9.4* 9.6* 10.4*  HCT 28.3*  < > 28.8* 28.7* 28.3* 28.9* 32.2*  MCV 93.1  < > 93.8 97.3 97.3 98.0 101.6*  PLT 8*  < > 6* 8* 10* 18* 19*  < > = values in this interval not displayed. Cardiac Enzymes: No results found for this basename: CKTOTAL, CKMB, CKMBINDEX, TROPONINI,  in the last 168 hours BNP (last 3 results)  Recent Labs  07/04/2014 1530  PROBNP 12303.0*   CBG:  Recent Labs Lab 07/13/14 1206 07/13/14 1627  07/13/14 2113 07/18/2014 0603 07/08/2014 1216  GLUCAP 399* 201* 200* 118* 127*    Recent Results (from the past 240 hour(s))  CULTURE, BLOOD (ROUTINE X 2)     Status: None   Collection Time    07/16/2014  3:30 PM      Result Value Ref Range Status   Specimen Description BLOOD LEFT ARM   Final   Special Requests BOTTLES DRAWN AEROBIC AND ANAEROBIC [facility]   Final   Culture  Setup Time     Final   Value: 07/17/2014 19:23     Performed at [facility]   Culture     Final   Value: ENTEROCOCCUS SPECIES     Note: SUSCEPTIBILITIES PERFORMED ON PREVIOUS CULTURE WITHIN THE LAST 5 DAYS.     Note: Gram Stain Report Called to,Read Back By and Verified With: JAMES ARTIS 07/07/14 AT 0730 [place]     Performed at [facility]   Report Status 07/09/2014 FINAL   Final  URINE CULTURE     Status: None   Collection Time    07/30/2014  4:18 PM      Result Value Ref Range Status   Specimen Description URINE, CLEAN CATCH   Final   Special Requests NONE   Final   Culture  Setup Time     Final   Value: 07/07/2014 02:46     Performed at [place]     Final   Value: 35,000 COLONIES/ML     Performed at [facility]   Culture     Final   Value: ENTEROBACTER CLOACAE     Performed at [facility]   Report Status 07/09/2014 FINAL   Final   Organism ID, Bacteria ENTEROBACTER CLOACAE   Final  CULTURE, BLOOD (ROUTINE X 2)     Status: None   Collection Time    07/22/2014  4:30 PM      Result Value Ref Range Status   Specimen Description BLOOD R ARM   Final   Special Requests BOTTLES DRAWN AEROBIC AND ANAEROBIC [facility] EACH   Final   Culture  Setup Time     Final   Value: 07/05/2014 19:22     Performed at [facility]   Culture     Final   Value: ENTEROCOCCUS SPECIES     Note: Gram Stain Report Called to,Read Back By and Verified With: JAMES ARTIS 07/07/14 AT 0730 [place]     Performed at [facility]   Report Status 07/09/2014 FINAL   Final   Organism  ID, Bacteria ENTEROCOCCUS SPECIES   Final  MRSA PCR SCREENING     Status: None   Collection Time    07/12/2014  7:52 PM      Result Value Ref Range Status   MRSA by PCR NEGATIVE  NEGATIVE Final   Comment:            The GeneXpert MRSA Assay (FDA     approved for NASAL specimens     only), is one component of a     comprehensive MRSA colonization     surveillance program. It is not     intended to diagnose MRSA     infection nor to guide or     monitor treatment for     MRSA infections.  CULTURE, BLOOD (ROUTINE X 2)     Status: None   Collection Time    07/08/14 12:00 PM      Result Value Ref Range Status   Specimen Description BLOOD RIGHT HAND   Final   Special Requests BOTTLES DRAWN AEROBIC ONLY [facility]   Final   Culture  Setup Time     Final   Value: 07/08/2014 17:14     Performed at [facility]   Culture     Final   Value: NO GROWTH 5 DAYS     Performed at [facility]   Report Status 07/06/2014 FINAL   Final  CULTURE, BLOOD (ROUTINE X 2)     Status: None   Collection Time    07/08/14 12:10 PM      Result Value Ref Range Status   Specimen Description BLOOD LEFT HAND   Final   Special Requests BOTTLES DRAWN AEROBIC ONLY [facility]   Final   Culture  Setup Time     Final   Value: 07/08/2014 17:05  Performed at [facility]     Final   Value: NO GROWTH 5 DAYS     Performed at [facility]   Report Status 07/05/2014 FINAL   Final  FECAL OCCULT BLOOD, IMMUNOCHEMICAL     Status: None   Collection Time    07/13/14  5:24 PM      Result Value Ref Range Status   Fecal Occult Bld Negative  Negative Final     Studies: No results found.  Scheduled Meds: . [MAR HOLD] sodium chloride   Intravenous Once  . [MAR HOLD] sodium chloride   Intravenous Once  . [MAR HOLD] ampicillin (OMNIPEN) IV  2 g Intravenous 4 times per day  . [facility] HOLD] atorvastatin  10 mg Oral QPM  . [MAR HOLD] furosemide  80 mg Oral Daily  . gentamicin irrigation  80 mg  Irrigation On Call  . [facility] HOLD] gentamicin  80 mg Intravenous Q24H  . [MAR HOLD] insulin aspart  0-9 Units Subcutaneous TID WC  . lidocaine-prilocaine  1 application Topical Once  . [MAR HOLD] pantoprazole  40 mg Oral Daily  . [facility] HOLD] predniSONE  80 mg Oral Q breakfast  . [MAR HOLD] pyridOXINE  100 mg Oral QPM  . [MAR HOLD] tamsulosin  0.4 mg Oral QPC supper  . [facility] HOLD] vitamin E  400 Units Oral Daily   Continuous Infusions: . sodium chloride 20 mL/hr at 07/19/2014 0505  . sodium chloride      Principal Problem:   Enterococcal bacteremia Active Problems:   CAD (coronary artery disease)   Anemia   Thrombocytopenia   Pacemaker-Medtronic   Mitral regurgitation   Sepsis   TIA (transient ischemic attack)    Time spent: 25 minutes.     Niel Hummer A  [facility] Pager [phone]. If 7PM-7AM, please contact night-coverage at [email or web address], password [facility] 07/13/2014, 4:37 PM  LOS: 8 days

## 2014-07-14 NOTE — H&P (View-Only) (Signed)
Justin [name] is doing well this morning. He will be going for his TEE this morning. Platelets have been ordered. I will make sure that he gets adequate premedication.  There are no labs back yet. I will go ahead and give him a dose of Nplate.  He did well with physical therapy yesterday.  His appetite continues to be good. He said no nausea or vomiting. He had a little bit of indigestion this morning.  He continues on the ampicillin and gentamicin for the enterococcus.  He says his also given the pacemaker out today.  His vital signs all looked good. Blood pressure is 146/52. Temperature is 97.8. Pulse is 72 and regular. His oral exam shows no thrush. Lungs are clear. Cardiac exam regular in rhythm. His 1/6 systolic ejection murmur. Abdomen is soft. Has good bowel sounds. There is no fluid wave. There is no palpable liver or spleen tip. Extremities shows no clubbing cyanosis or edema. Skin exam shows no ecchymoses or petechia.  I would like to think that he would be able to go home soon. He will need outpatient antibiotics. He will need to have a PICC line placed. This,, I am sure, can be done in the next day or so.  It will be very interesting to see how his blood counts look. Hopefully we will see a trend going up toward this week.  [place] 55:22

## 2014-07-14 NOTE — CV Procedure (Addendum)
See full TEE report in [employer]; normal LV function; severe MR; large aortic valve vegetation with moderate AI; no pacemaker vegetation identified. Will ask CVTS to see for AVR/MVR. Will need further improvement in platelets and pacemaker removal preop. Kirk Ruths

## 2014-07-14 NOTE — Progress Notes (Signed)
Patient ID: Justin [name], male   DOB: [birth year], 80 y.o.   MRN: [id number]         [facility]    Date of Admission:  07/11/2014    Total days of antibiotics 9        Day 3 ampicillin        Day 3 gentamicin         Principal Problem:   Enterococcal bacteremia Active Problems:   CAD (coronary artery disease)   Anemia   Thrombocytopenia   Pacemaker-Medtronic   Mitral regurgitation   Sepsis   TIA (transient ischemic attack)   . sodium chloride   Intravenous Once  . sodium chloride   Intravenous Once  . ampicillin (OMNIPEN) IV  2 g Intravenous 4 times per day  . atorvastatin  10 mg Oral QPM  . furosemide  80 mg Oral Daily  . gentamicin irrigation  80 mg Irr[facility] On Call  . gentamicin  80 mg Intravenous Q24H  . insulin aspart  0-9 Units Subcutaneous TID WC  . lidocaine-prilocaine  1 application Topical Once  . pantoprazole  40 mg Oral Daily  . predniSONE  80 mg Oral Q breakfast  . pyridOXINE  100 mg Oral QPM  . tamsulosin  0.4 mg Oral QPC supper  . vitamin E  400 Units Oral Daily    Subjective: He feels a little more tired today after his TEE this morning.  Objective: Temp:  [97.8 F (36.6 C)-98.5 F (36.9 C)] 98.1 F (36.7 C) (08/11 0950) Pulse Rate:  [67-77] 67 (08/11 1043) Resp:  [16-25] 16 (08/11 1043) BP: (135-176)/(47-71) 135/50 mmHg (08/11 1130) SpO2:  [94 %-100 %] 94 % (08/11 1043) Weight:  [195 lb 5.2 oz (88.6 kg)] 195 lb 5.2 oz (88.6 kg) (08/11 0615)  General: He is alert and comfortable visiting with family Skin: No rash Lungs: Clear Cor: Regular S1 and S2 with 2/6 systolic murmur  Lab Results Lab Results  Component Value Date   WBC 11.4* 07/13/2014   HGB 10.4* 07/05/2014   HCT 32.2* 07/24/2014   MCV 101.6* 07/07/2014   PLT 19* 07/05/2014    Lab Results  Component Value Date   CREATININE 1.36* 07/12/2014   BUN 32* 07/10/2014   NA 142 07/20/2014   K 3.7 07/16/2014   CL 104 07/11/2014   CO2 23 07/30/2014    Lab  Results  Component Value Date   ALT 54* 07/31/2014   AST 56* 08/02/2014   ALKPHOS 62 07/07/2014   BILITOT 0.8 07/08/2014      Microbiology: Recent Results (from the past 240 hour(s))  CULTURE, BLOOD (ROUTINE X 2)     Status: None   Collection Time    07/04/2014  3:30 PM      Result Value Ref Range Status   Specimen Description BLOOD LEFT ARM   Final   Special Requests BOTTLES DRAWN AEROBIC AND ANAEROBIC [facility]   Final   Culture  Setup Time     Final   Value: 07/31/2014 19:23     Performed at [facility]   Culture     Final   Value: ENTEROCOCCUS SPECIES     Note: SUSCEPTIBILITIES PERFORMED ON PREVIOUS CULTURE WITHIN THE LAST 5 DAYS.     Note: Gram Stain Report Called to,Read Back By and Verified With: JAMES ARTIS 07/07/14 AT 0730 [place]     Performed at [facility]   Report Status 07/09/2014 FINAL   Final  URINE CULTURE     Status: None   Collection Time    07/09/2014  4:18 PM      Result Value Ref Range Status   Specimen Description URINE, CLEAN CATCH   Final   Special Requests NONE   Final   Culture  Setup Time     Final   Value: 07/07/2014 02:46     Performed at [place]     Final   Value: 35,000 COLONIES/ML     Performed at [facility]   Culture     Final   Value: ENTEROBACTER CLOACAE     Performed at [facility]   Report Status 07/09/2014 FINAL   Final   Organism ID, Bacteria ENTEROBACTER CLOACAE   Final  CULTURE, BLOOD (ROUTINE X 2)     Status: None   Collection Time    07/15/2014  4:30 PM      Result Value Ref Range Status   Specimen Description BLOOD R ARM   Final   Special Requests BOTTLES DRAWN AEROBIC AND ANAEROBIC [facility] EACH   Final   Culture  Setup Time     Final   Value: 07/27/2014 19:22     Performed at [facility]   Culture     Final   Value: ENTEROCOCCUS SPECIES     Note: Gram Stain Report Called to,Read Back By and Verified With: JAMES ARTIS 07/07/14 AT 0730 RIDK     Performed at [facility]   Report Status 07/09/2014 FINAL   Final   Organism ID, Bacteria ENTEROCOCCUS SPECIES   Final  MRSA PCR SCREENING     Status: None   Collection Time    07/31/2014  7:52 PM      Result Value Ref Range Status   MRSA by PCR NEGATIVE  NEGATIVE Final   Comment:            The GeneXpert MRSA Assay (FDA     approved for NASAL specimens     only), is one component of a     comprehensive MRSA colonization     surveillance program. It is not     intended to diagnose MRSA     infection nor to guide or     monitor treatment for     MRSA infections.  CULTURE, BLOOD (ROUTINE X 2)     Status: None   Collection Time    07/08/14 12:00 PM      Result Value Ref Range Status   Specimen Description BLOOD RIGHT HAND   Final   Special Requests BOTTLES DRAWN AEROBIC ONLY [facility]   Final   Culture  Setup Time     Final   Value: 07/08/2014 17:14     Performed at [facility]   Culture     Final   Value: NO GROWTH 5 DAYS     Performed at [facility]   Report Status 07/29/2014 FINAL   Final  CULTURE, BLOOD (ROUTINE X 2)     Status: None   Collection Time    07/08/14 12:10 PM      Result Value Ref Range Status   Specimen Description BLOOD LEFT HAND   Final   Special Requests BOTTLES DRAWN AEROBIC ONLY [facility]   Final   Culture  Setup Time     Final   Value: 07/08/2014 17:05     Performed at [facility]  Final   Value: NO GROWTH 5 DAYS     Performed at [facility]   Report Status 07/16/2014 FINAL   Final  FECAL OCCULT BLOOD, IMMUNOCHEMICAL     Status: None   Collection Time    07/13/14  5:24 PM      Result Value Ref Range Status   Fecal Occult Bld Negative  Negative Final    Studies/Results: No results found.  Assessment: His TEE confirmed endocarditis with severe mitral regurgitation, a large aortic valve vegetation and moderate aortic regurgitation.  Plan: 1. Continue ampicillin and gentamicin 2. Pacemaker removal 3. Cardiac surgery  evaluation  Michel Bickers, MD [facility] for [place] [phone] pager   [phone] cell 07/12/2014, 2:39 PM

## 2014-07-14 NOTE — H&P (View-Only) (Signed)
ELECTROPHYSIOLOGY CONSULT NOTE    Patient ID: Justin [name] MRN: [id number], DOB/AGE: [id number] y.o.  Admit date: 07/05/2014 Date of Consult: 07-13-2014  Primary Physician: Penni Homans, MD Primary Cardiologist: Stanford Breed Electrophysiologist: Caryl Comes  Reason for Consultation: bacteremia  HPI:  Justin [name] is a 80 y.o. male with a past medical history significant for CAD, hypertension, atrial fibrillation, Mobitz II heart block (s/p PPM implant 2014), thrombocytopenia.  He is followed closely by Dr Stanford Breed.  He has had progressive heart failure symptoms and plans were for outpatient TEE. On pre-procedure labs, his platelets were noted to be significantly decreased and he was referred to Dr Marin Olp for further evaluation.  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  He was seen in the oncology office on the day of admission and developed dyspnea and fever.  He was referred to [facility] for further evaluation.  Blood cultures are positive for enterococcal bacteremia.  EP has been asked to consult for pacemaker system removal.    Pacemaker was implanted 04-2013 for Mobitz II heart block. Device interrogation today demonstrates sinus rhythm with prolonged PR interval, but intrinsic conduction.  Device has been reprogrammed to MVP to allow for evaluation of underlying rhythm overnight.    Platelets remain low at 18K, WBC 11.1, creat 1.5.    Echo this admission demonstrates EF 50-55% (lower per Dr Jacalyn Lefevre review), no RWMA, moderate to severe MR, moderate TR, PA pressure 65.    He currently states that his breathing is improved.  He denies fevers or chills.  No shortness of breath.  ROS is otherwise negative except as outlined above.   Past Medical History  Diagnosis Date  . CAD (coronary artery disease) prior stenting 1999   . Dyslipidemia   . HTN (hypertension)   . WPW (Wolff-Parkinson-White syndrome) loss of preexcitation   . Atrial  fibrillation   . AV block, Mobitz II   . Presyncope   . Myocardial infarction   . Pacemaker 04/07/2013  . Arthritis   . History of chicken pox   . Anemia   . Hypernatremia 06/03/2013  . Thrombocytopenia, unspecified 06/03/2013  . Leukopenia 06/03/2013  . Obstructive sleep apnea 06/03/2013    USES CPAP  . Urinary incontinence 06/03/2013  . Hyperglycemia 12/21/2013  . Pancytopenia 06/03/2013  . Iron deficiency anemia, unspecified 07/02/2014  . Malabsorption of iron 07/02/2014  . Hypotestosteronemia 07/02/2014  . ITP (idiopathic thrombocytopenic purpura) 07/02/2014     Surgical History:  Past Surgical History  Procedure Laterality Date  . Coronary angioplasty with stent placement    . Vastectomy    . Insert / replace / remove pacemaker  04/07/2013     Prescriptions prior to admission  Medication Sig Dispense Refill  . atorvastatin (LIPITOR) 10 MG tablet Take 1 tablet (10 mg total) by mouth every evening.  90 tablet  3  . Azelaic Acid (FINACEA) 15 % cream Apply 1 application topically daily. After skin is thoroughly washed and patted dry, gently but thoroughly massage a thin film of azelaic acid creame      . Cholecalciferol (VITAMIN D-3) 1000 UNITS CAPS Take 1 capsule by mouth daily.      . furosemide (LASIX) 20 MG tablet Take 3 tablets (60 mg total) by mouth daily.  90 tablet  12  . losartan (COZAAR) 50 MG tablet Take 1 tablet (50 mg total) by mouth daily.  90 tablet  3  . Multiple Vitamin (MULTIVITAMIN WITH  MINERALS) TABS Take 1 tablet by mouth 2 (two) times a week.       . nadolol (CORGARD) 20 MG tablet Take 10 mg by mouth daily.      . nitroGLYCERIN (NITROSTAT) 0.4 MG SL tablet Place 1 tablet (0.4 mg total) under the tongue every 5 (five) minutes as needed for chest pain. x3 doses as needed for chest pain  25 tablet  12  . omeprazole (PRILOSEC) 20 MG capsule Take 20 mg by mouth daily as needed (for heartburn).       Vladimir Faster Glycol-Propyl Glycol (SYSTANE ULTRA OP) Apply 1 drop to eye 4  (four) times daily as needed (for dry eyes).      . predniSONE (DELTASONE) 20 MG tablet Take 80 mg by mouth daily with breakfast.      . Probiotic Product (PROBIOTIC DAILY PO) Take 1 capsule by mouth daily.       Marland Kitchen pyridOXINE (VITAMIN B-6) 100 MG tablet Take 50 mg by mouth every evening.       . tamsulosin (FLOMAX) 0.4 MG CAPS capsule Take 0.4 mg by mouth at bedtime.      . vitamin B-12 (CYANOCOBALAMIN) 1000 MCG tablet Take 1,000 mcg by mouth every evening.      . Vitamin E (VITA-PLUS E PO) Take 1 capsule by mouth daily.        Inpatient Medications:  . sodium chloride   Intravenous Once  . ampicillin (OMNIPEN) IV  2 g Intravenous 4 times per day  . atorvastatin  10 mg Oral QPM  . furosemide  80 mg Oral Daily  . gentamicin  80 mg Intravenous Q24H  . insulin aspart  0-9 Units Subcutaneous TID WC  . pantoprazole  40 mg Oral Daily  . predniSONE  80 mg Oral Q breakfast  . pyridOXINE  100 mg Oral QPM  . tamsulosin  0.4 mg Oral QPC supper  . vitamin E  400 Units Oral Daily    Allergies: No Known Allergies  History   Social History  . Marital Status: Married    Spouse Name: N/A    Number of Children: 3  . Years of Education: N/A   Occupational History  .      Retired   Social History Main Topics  . Smoking status: Former Smoker -- 1.00 packs/day for 36 years    Types: Cigarettes    Start date: 01/25/1955    Quit date: 12/05/1991  . Smokeless tobacco: Never Used     Comment: quit smoking 22 yeras ago  . Alcohol Use: Yes     Comment: Occasional  . Drug Use: No  . Sexual Activity: Not on file   Other Topics Concern  . Not on file   Social History Narrative  . No narrative on file     Family History  Problem Relation Age of Onset  . Stroke Mother   . Hypertension Mother   . Stroke Sister   . Arthritis Sister     rheumatoid  . Heart attack Sister   . Anemia Sister   . Other Brother     tube put in aorta  . Anemia Brother   . Other Son     cortisone deficiency    . Arthritis Son   . Stroke Brother   . Alcohol abuse Brother   . Barrett's esophagus Son   . Colon cancer Maternal Grandmother   . Colon cancer Maternal Grandfather     BP 137/56  Pulse 75  Temp(Src) 98.5 F (36.9 C) (Oral)  Resp 18  Ht 5\' 11"  (1.803 m)  Wt 194 lb 6.4 oz (88.179 kg)  BMI 27.13 kg/m2  SpO2 99%  Physical Exam: Well appearing 80 yo man, NAD HEENT: Unremarkable,[place], AT Neck:  6 JVD, no thyromegally Back:  No CVA tenderness Lungs:  Clear with no wheezes, rales, or rhonchi HEART:  Regular rate rhythm, no murmurs, no rubs, no clicks Abd:  soft, positive bowel sounds, no organomegally, no rebound, no guarding Ext:  2 plus pulses, no edema, no cyanosis, no clubbing Skin:  No rashes no nodules Neuro:  CN II through XII intact, motor grossly intact  Labs:   Lab Results  Component Value Date   WBC 11.1* 07/13/2014   HGB 9.6* 07/13/2014   HCT 28.9* 07/13/2014   MCV 98.0 07/13/2014   PLT 18* 07/13/2014    Recent Labs Lab 07/13/14 0415  NA 142  K 3.5*  CL 104  CO2 24  BUN 35*  CREATININE 1.50*  CALCIUM 8.1*  PROT 6.0  BILITOT 0.9  ALKPHOS 61  ALT 60*  AST 71*  GLUCOSE 109*    Radiology/Studies: Ct Abdomen Pelvis Wo Contrast 07/09/2014   CLINICAL DATA:  Enterococcus bacteremia. Evaluate for abdominal source. Evaluate for potential tumor. Enterobacter is also present in the urine.  EXAM: CT ABDOMEN AND PELVIS WITHOUT CONTRAST  TECHNIQUE: Multidetector CT imaging of the abdomen and pelvis was performed following the standard protocol without IV contrast.  COMPARISON:  No priors.  FINDINGS: Lung Bases: Atherosclerotic calcifications in the right coronary artery. Pacemaker lead terminating in the right ventricular apex. Moderate-sized hiatal hernia. Moderate to large bilateral pleural effusions which appear to layer dependently and are associated with extensive passive atelectasis in the visualized portions of the lower lobes of the lungs bilaterally.  Abdomen/Pelvis:  9 mm low attenuation lesion in segment 2 of the liver is incompletely characterized on today's noncontrast CT examination, but is unchanged, and favored to represent a tiny cyst. No other focal hepatic lesions are identified on today's non contrast CT examination. There is a tiny calcifications in the head of the pancreas (image 31 of series 2) which appears to be immediately posterior to the distal common bile duct, potentially related to prior episodes of pancreatitis. Other small calcifications are also noted in the head and tail of the pancreas as well. The calcification in the head of the pancreas is not favored to be within the common bile duct, as the common bile duct is normal in caliber measuring 4 mm, and there is no intrahepatic biliary ductal dilatation to suggest obstruction. The unenhanced appearance of the gallbladder, spleen and bilateral adrenal glands is unremarkable. There is bilateral perinephric stranding which is nonspecific. 1.5 cm intermediate attenuation lesion extending exophytically off the lateral aspect of the upper pole of the right kidney is incompletely characterized on today's non contrast CT examination, but is statistically likely to represent a mildly proteinaceous cyst.  Normal appendix. Trace volume of ascites. No pneumoperitoneum. No pathologic distention of small bowel. 10 mm short axis inguinal lymph nodes bilaterally are nonspecific. No other definite lymphadenopathy identified in the abdomen or pelvis on today's non contrast CT examination. Mild diffuse mesenteric edema. Prostate gland is enlarged measuring 4.9 x 6.1 cm. Urinary bladder is generally unremarkable in appearance, however, the attenuation values in the urine are rather high ranging from 45-55 HU, suggesting proteinaceous or hemorrhagic contents.  Musculoskeletal: Mild diffuse body wall edema. There are no aggressive appearing lytic or  blastic lesions noted in the visualized portions of the skeleton.   IMPRESSION: 1. No definite source for bacteremia identified on today's examination. 2. Relatively high attenuation of the urine is of uncertain etiology and significance, but may suggest some proteinaceous or hemorrhagic contents. 3. Findings suggestive of anasarca, including moderate to large bilateral pleural effusions, trace volume of ascites, mild diffuse mesenteric edema and diffuse body wall edema. 4. Prostatomegaly. 5. Additional incidental findings, as above.   Electronically Signed   By: Vinnie Langton M.D.   On: 07/09/2014 12:46   Dg Chest 2 View 07/09/2014   CLINICAL DATA:  Fever, chills, shortness of breath.  EXAM: CHEST  2 VIEW  COMPARISON:  06/10/2014  FINDINGS: Left pacer in place with leads in the right atrium and right ventricle. Heart is normal size. Mild peribronchial thickening. No confluent airspace opacities or effusions. No acute bony abnormality.  IMPRESSION: Bronchitic changes.   Electronically Signed   By: Rolm Baptise M.D.   On: 07/05/2014 15:07   LHT:DSKAJ rhythm with ventricular pacing, rate 93  DEVICE HISTORY: MDT ADDRL1 pacemaker implanted 04-2013 by Dr Caryl Comes - 5076 right atrial and right ventricular leads  A/P 1. 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 Thrombocytopenia Rec: He will need PM system removal, now 15 months after implant. He will need either improvement in his plt count or plt transfusion. He is tentatively scheduled for PM system extraction. If his plt count continues to improve, might wait until Wedniesday for PM system extraction to avoid another plt transfusion.  Mikle Bosworth.D.

## 2014-07-14 NOTE — Interval H&P Note (Signed)
History and Physical Interval Note:  07/16/2014 9:18 AM  Justin [name]  has presented today for surgery, with the diagnosis of endocarditis  The various methods of treatment have been discussed with the patient and family. After consideration of risks, benefits and other options for treatment, the patient has consented to  Procedure(s): TRANSESOPHAGEAL ECHOCARDIOGRAM (TEE) (N/A) as a surgical intervention .  The patient's history has been reviewed, patient examined, no change in status, stable for surgery.  I have reviewed the patient's chart and labs.  Questions were answered to the patient's satisfaction.     Kirk Ruths

## 2014-07-14 NOTE — Interval H&P Note (Signed)
History and Physical Interval Note: Since my prior electrophysiologic consultation note, it has been discovered that the patient has a mobile vegetation on his aortic valve, confirmation of severe mitral regurgitation, as well as aortic regurgitation. His platelet count has been stable. During his transesophageal echo several hours ago, he was transfused with a unit of plasmapheresed platelets. I discussed the patient's case with Dr. Stanford Breed, Dr. Servando Snare, and Dr. Marin Olp. Currently, the patient is a poor operative candidate because of his thrombocytopenia, at least as it pertains to median sternotomy and valve replacement. It has been felt that removing the dual-chamber pacing lead which has been contaminated by his enterococcal bacteremia and endocarditis should be done prior to valve replacement surgery. The patient may require a temporary permanent pacemaker. Because he was felt to be a poor emergency surgery candidate, we will proceed with pacemaker system extraction in our electrophysiology laboratory. I would prefer not to use general anesthesia because of the potential risk. The patient remains very ill with his multiple problems. Hopefully as he improves, and his pacemaker system has been removed, his platelet count will increase. He is currently pending formal cardiovascular surgery consultation. I have reviewed all the above issues with the patient, his wife, and his daughter, and they agreed to proceed with pacemaker system extraction. The risk of bleeding, including pericardial effusion as well as hemorrhagic pleural effusion have been discussed.  07/13/2014 3:41 PM  Justin [name]  has presented today for surgery, with the diagnosis of BAD LEAD  The various methods of treatment have been discussed with the patient and family. After consideration of risks, benefits and other options for treatment, the patient has consented to  Procedure(s): LEAD EXTRACTION (N/A) as a surgical intervention .  The  patient's history has been reviewed, patient examined, no change in status, stable for surgery.  I have reviewed the patient's chart and labs.  Questions were answered to the patient's satisfaction.     Mikle Bosworth.D.

## 2014-07-14 NOTE — Progress Notes (Signed)
IV saline locked. Pacing pads on and connected to Lifepac

## 2014-07-14 NOTE — CV Procedure (Signed)
Electrophysiology procedure note  Procedure: Extraction of a dual-chamber pacing system  Preoperative diagnosis: Enterococcus bacteremia and aortic valve endocarditis, with presumed involvement of the patient's pacing system  Postoperative diagnoses: Same as preoperative diagnosis  Description of the procedure: After informed consent was obtained, the patient was taken to the diagnostic electrophysiology laboratory in the fasting state. After the usual preparation and draping, intravenous Versed and fentanyl were used for sedation. 30 cc of lidocaine was infiltrated into the left infraclavicular region. A 5 cm incision was carried out over this region and electro-cautery was utilized to dissect down to the fascial plane. The pacemaker pocket was entered. Electrocautery was utilized to dissect the generator free as well as dissection of the leads free of their fibrous adhesions. The sewing sleeve was dissected free with electrocautery. Hemostasis was quite good. A 52 cm left was inserted into the atrial lead, and the helix was retracted. Gentle traction was applied to the lead and it was removed in total. There were no hemodynamic sequelae. Next attention was turned to the ventricular lead. 58 cm stylette was advanced into the lead body and again the helix retracted. Gentle traction was placed on the lead and it was removed in total. The blood pressure was stable. Pressure was held. Hemostasis was obtained. The pocket was irrigated with antibiotic irrigation. The incision was closed with 2 layers of Vicryl suture. Benzoin and Steri-Strips for pain on the skin, and the patient was returned to his room in satisfactory condition.  Complications: There were no immediate procedural complications  Conclusion: Successful extraction of a Medtronic dual-chamber pacemaker in a patient with enterococcal endocarditis.  Cristopher Peru, M.D.

## 2014-07-15 ENCOUNTER — Inpatient Hospital Stay (HOSPITAL_COMMUNITY): Payer: Medicare Other

## 2014-07-15 ENCOUNTER — Ambulatory Visit: Payer: Medicare Other | Admitting: Cardiology

## 2014-07-15 ENCOUNTER — Encounter (HOSPITAL_COMMUNITY): Payer: Self-pay | Admitting: Cardiology

## 2014-07-15 DIAGNOSIS — G459 Transient cerebral ischemic attack, unspecified: Secondary | ICD-10-CM

## 2014-07-15 DIAGNOSIS — I330 Acute and subacute infective endocarditis: Secondary | ICD-10-CM

## 2014-07-15 DIAGNOSIS — N183 Chronic kidney disease, stage 3 unspecified: Secondary | ICD-10-CM

## 2014-07-15 DIAGNOSIS — I10 Essential (primary) hypertension: Secondary | ICD-10-CM

## 2014-07-15 DIAGNOSIS — T889XXS Complication of surgical and medical care, unspecified, sequela: Secondary | ICD-10-CM

## 2014-07-15 DIAGNOSIS — E785 Hyperlipidemia, unspecified: Secondary | ICD-10-CM

## 2014-07-15 LAB — GLUCOSE, CAPILLARY
GLUCOSE-CAPILLARY: 93 mg/dL (ref 70–99)
Glucose-Capillary: 162 mg/dL — ABNORMAL HIGH (ref 70–99)
Glucose-Capillary: 148 mg/dL — ABNORMAL HIGH (ref 70–99)
Glucose-Capillary: 193 mg/dL — ABNORMAL HIGH (ref 70–99)

## 2014-07-15 LAB — GENTAMICIN LEVEL, PEAK: GENTAMICIN PK: 2.5 ug/mL — AB (ref 5.0–10.0)

## 2014-07-15 LAB — PREPARE PLATELET PHERESIS: Unit division: 0

## 2014-07-15 LAB — COMPREHENSIVE METABOLIC PANEL
ALK PHOS: 64 U/L (ref 39–117)
ALT: 47 U/L (ref 0–53)
ANION GAP: 14 (ref 5–15)
AST: 50 U/L — ABNORMAL HIGH (ref 0–37)
Albumin: 2.4 g/dL — ABNORMAL LOW (ref 3.5–5.2)
BILIRUBIN TOTAL: 0.7 mg/dL (ref 0.3–1.2)
BUN: 31 mg/dL — AB (ref 6–23)
CHLORIDE: 103 meq/L (ref 96–112)
CO2: 24 mEq/L (ref 19–32)
CREATININE: 1.41 mg/dL — AB (ref 0.50–1.35)
Calcium: 8.3 mg/dL — ABNORMAL LOW (ref 8.4–10.5)
GFR, EST AFRICAN AMERICAN: 53 mL/min — AB (ref >90)
GFR, EST NON AFRICAN AMERICAN: 46 mL/min — AB (ref >90)
GLUCOSE: 106 mg/dL — AB (ref 70–99)
POTASSIUM: 4 meq/L (ref 3.7–5.3)
Sodium: 141 mEq/L (ref 137–147)
Total Protein: 6 g/dL (ref 6.0–8.3)

## 2014-07-15 LAB — CBC
HEMATOCRIT: 31.8 % — AB (ref 39.0–52.0)
HEMOGLOBIN: 10.1 g/dL — AB (ref 13.0–17.0)
MCH: 32.4 pg (ref 26.0–34.0)
MCHC: 31.8 g/dL (ref 30.0–36.0)
MCV: 101.9 fL — AB (ref 78.0–100.0)
Platelets: 41 10*3/uL — ABNORMAL LOW (ref 150–400)
RBC: 3.12 MIL/uL — ABNORMAL LOW (ref 4.22–5.81)
RDW: 23 % — ABNORMAL HIGH (ref 11.5–15.5)
WBC: 8.6 10*3/uL (ref 4.0–10.5)

## 2014-07-15 LAB — LACTATE DEHYDROGENASE: LDH: 612 U/L — AB (ref 94–250)

## 2014-07-15 LAB — GENTAMICIN LEVEL, TROUGH

## 2014-07-15 MED ORDER — INSULIN ASPART 100 UNIT/ML ~~LOC~~ SOLN
0.0000 [IU] | Freq: Three times a day (TID) | SUBCUTANEOUS | Status: DC
  Administered 2014-07-15: 2 [IU] via SUBCUTANEOUS
  Administered 2014-07-15: 1 [IU] via SUBCUTANEOUS
  Administered 2014-07-16: 2 [IU] via SUBCUTANEOUS
  Administered 2014-07-17: 1 [IU] via SUBCUTANEOUS
  Administered 2014-07-17: 3 [IU] via SUBCUTANEOUS
  Administered 2014-07-18 – 2014-07-19 (×3): 2 [IU] via SUBCUTANEOUS
  Administered 2014-07-19: 1 [IU] via SUBCUTANEOUS
  Administered 2014-07-19: 2 [IU] via SUBCUTANEOUS
  Administered 2014-07-20 – 2014-07-21 (×2): 3 [IU] via SUBCUTANEOUS
  Administered 2014-07-21 – 2014-07-22 (×2): 2 [IU] via SUBCUTANEOUS
  Administered 2014-07-22: 1 [IU] via SUBCUTANEOUS
  Administered 2014-07-23 (×2): 2 [IU] via SUBCUTANEOUS

## 2014-07-15 MED ORDER — AMLODIPINE BESYLATE 5 MG PO TABS
5.0000 mg | ORAL_TABLET | Freq: Every day | ORAL | Status: DC
  Administered 2014-07-15 – 2014-07-16 (×2): 5 mg via ORAL
  Filled 2014-07-15 (×3): qty 1

## 2014-07-15 MED ORDER — PREDNISONE 50 MG PO TABS
80.0000 mg | ORAL_TABLET | Freq: Every day | ORAL | Status: DC
  Administered 2014-07-15: 80 mg via ORAL
  Filled 2014-07-15: qty 1

## 2014-07-15 MED ORDER — GENTAMICIN IN SALINE 1-0.9 MG/ML-% IV SOLN
100.0000 mg | INTRAVENOUS | Status: DC
  Administered 2014-07-16 – 2014-07-23 (×8): 100 mg via INTRAVENOUS
  Filled 2014-07-15 (×10): qty 100

## 2014-07-15 NOTE — Progress Notes (Signed)
Physical Therapy Treatment Patient Details Name: Justin [name] MRN: [id number] DOB: [birth year] Today's Date: 07/15/2014    History of Present Illness Pt is a 80 y.o. male with history of CAD status post stents, hypertension, dyslipidemia, a-fib, pacemaker, ITP currently being treated with IVIG, who presents to the emergency department with complaints of fever and shortness of breath that started 8/2. In ED found to have leukocytosis, hypotension, and lactic acidosis. Transferred to MICU for suspected sepsis. On 07/21/2014 pt underwent TEE. Pacemaker was removed due to a vegetation that was found on his aortic valve.     PT Comments    Pt progressing towards physical therapy goals. Issued HEP and pt was able to tolerate more difficulty therapeutic exercise. HR remained in mid-high 80's throughout activity/mobility.  Follow Up Recommendations  Home health PT;Supervision for mobility/OOB     Equipment Recommendations  Rolling walker with 5" wheels    Recommendations for Other Services       Precautions / Restrictions Precautions Precautions: Fall Restrictions Weight Bearing Restrictions: No    Mobility  Bed Mobility Overal bed mobility: Needs Assistance Bed Mobility: Supine to Sit;Sit to Supine     Supine to sit: Supervision Sit to supine: Supervision   General bed mobility comments: VC's for sequencing and technique. Increased time for pt to get positioned.   Transfers Overall transfer level: Needs assistance Equipment used: None Transfers: Sit to/from Stand Sit to Stand: Min guard         General transfer comment: Pt states he feels more unsteady today. Min guard for safety, however no physical assist required.   Ambulation/Gait Ambulation/Gait assistance: Min guard Ambulation Distance (Feet): 150 Feet Assistive device: None Gait Pattern/deviations: Step-through pattern;Decreased stride length;Trunk flexed Gait velocity: Decreased Gait velocity interpretation:  Below normal speed for age/gender General Gait Details: Pt was able to tolerate ambulation with no AD and no LOB noted. Pt fatigues quickly today with gait training, however HR remained in mid-80's.    Stairs            Wheelchair Mobility    Modified Rankin (Stroke Patients Only)       Balance Overall balance assessment: Needs assistance Sitting-balance support: No upper extremity supported;Feet supported Sitting balance-Leahy Scale: Good     Standing balance support: No upper extremity supported Standing balance-Leahy Scale: Fair                      Cognition Arousal/Alertness: Awake/alert Behavior During Therapy: WFL for tasks assessed/performed Overall Cognitive Status: Within Functional Limits for tasks assessed                      Exercises General Exercises - Lower Extremity Ankle Circles/Pumps: 10 reps Long Arc Quad: 20 reps Hip ABduction/ADduction: 10 reps Straight Leg Raises: 10 reps    General Comments General comments (skin integrity, edema, etc.): HEP issued to pt and discussed details of reps/sets/frequency      Pertinent Vitals/Pain Pain Assessment: No/denies pain    Home Living                      Prior Function            PT Goals (current goals can now be found in the care plan section) Acute Rehab PT Goals Patient Stated Goal: To return home with wife PT Goal Formulation: With patient/family Time For Goal Achievement: 07/16/14 Potential to Achieve Goals: Good Progress towards PT goals: Progressing toward  goals    Frequency  Min 3X/week    PT Plan Current plan remains appropriate    Co-evaluation             End of Session Equipment Utilized During Treatment: Gait belt Activity Tolerance: Patient limited by fatigue Patient left: with call bell/phone within reach;in chair     Time: 7544-9201 PT Time Calculation (min): 30 min  Charges:  $Gait Training: 8-22 mins $Therapeutic Exercise:  8-22 mins                    G Codes:      Jolyn Lent Jul 18, 2014, 12:13 PM  Jolyn Lent, PT, DPT Acute Rehabilitation Services Pager: [phone]

## 2014-07-15 NOTE — Progress Notes (Signed)
Received CR order (Dr Prescott Gum) however it was then lost after pacer transfer (Dr. Lovena Le). Please reorder Cardiac Rehab Phase I for ambulation and education. Thank you. Yves Dill CES, ACSM 8:06 AM 07/15/2014

## 2014-07-15 NOTE — Progress Notes (Signed)
Mr. Veney had a very busy day yesterday. He underwent his TEE. Unfortunately, a vegetation was found on his aortic valve.he then had his pacemaker removed. There is no problems with bleeding. He did receive one unit of platelets prior to the procedure.  He will need to his aortic valve replaced. He also has a bad mitral valve. He has been seen by cardiothoracic surgery.  His platelet count is 41,000 today. This is encouraging as it shows that he does respond to platelet transfusions.  He got another dose of Nplate yesterday. He is on prednisone.  We will never get his platelet count to "normal". I think that as long as his platelet count is above 60-70,000, that really should be adequate for surgery.  His hemoglobin is 10.1 this morning. His renal function is looking pretty good. He is afebrile. He is eating well. He's had no nausea or vomiting. There's been no issues with neurological changes.neurology is following.  His vital signs are stable. Blood pressure actually is on the high side now at 163/60. Lungs are clear. Cardiac exam regular in rhythm. His 1/6 systolic murmur. Abdomen is soft. There is no palpable liver or spleen tip.  Again, I don't see a lot of hematologic risk with his valve surgery. He can get transfuse with platelets right before the procedure. He had a very nice rise with the platelet transfusion yesterday. I expect that he will have another good response if he received platelets prior to the valve surgery.  We will continue to follow along.  Pete E.  Romans 13:7

## 2014-07-15 NOTE — Progress Notes (Signed)
Pt converted to Afib at 1922. Pt has hx of PAF. EKG done. MD notified. Will continue to monitor closely.

## 2014-07-15 NOTE — Progress Notes (Signed)
Subjective:  Denies CP or dyspnea; fatigued   Objective:  Filed Vitals:   07/15/14 0440 07/15/14 0806 07/15/14 1127 07/15/14 1702  BP: 163/60 175/59 156/47 162/51  Pulse:      Temp: 98.1 F (36.7 C) 97.8 F (36.6 C) 98.3 F (36.8 C) 98.5 F (36.9 C)  TempSrc: Oral Oral Oral Oral  Resp:  21 21 19   Height:      Weight: 195 lb 5.2 oz (88.6 kg)     SpO2: 99% 99% 98% 99%    Intake/Output from previous day:  Intake/Output Summary (Last 24 hours) at 07/15/14 1732 Last data filed at 07/15/14 1400  Gross per 24 hour  Intake    410 ml  Output   1375 ml  Net   -965 ml    Physical Exam: Physical exam: Well-developed well-nourished in no acute distress.  Skin is warm and dry.  HEENT is normal.  Neck is supple.  Chest with mildly diminished BS bases  Cardiovascular exam is regular rate and rhythm. 2/6 systolic and diastolic murmur LSB; 2/6 systolic murmur apex Abdominal exam nontender or distended. No masses palpated. Extremities show 2+ edema. neuro grossly intact    Lab Results: Basic Metabolic Panel:  Recent Labs  07/07/2014 0537 07/15/14 0317  NA 142 141  K 3.7 4.0  CL 104 103  CO2 23 24  GLUCOSE 103* 106*  BUN 32* 31*  CREATININE 1.36* 1.41*  CALCIUM 8.2* 8.3*   CBC:  Recent Labs  08/01/2014 0537 07/15/14 0317  WBC 11.4* 8.6  HGB 10.4* 10.1*  HCT 32.2* 31.8*  MCV 101.6* 101.9*  PLT 19* 41*     Assessment/Plan:  1 subacute bacterial endocarditis-enterococcus on initial blood cultures with most recent cultures negative. Patient afebrile. Continue ampicillin and gentamicin. Infectious disease following. Transesophageal echocardiogram shows aortic valve vegetation with moderate aortic insufficiency. There is also severe mitral regurgitation. Patient will need aortic valve replacement and either mitral valve replacement or mitral valve repair. His pacemaker has been removed. He will need cardiac catheterization prior to surgery. There will be increased  risk with catheterization both for contrast nephropathy (baseline renal insuff and presently being treated with gentamicin) and most likely for CVA as well (proximity of catheter to aortic valve vegetations and risk of embolism). Patient understands risk and is agreeable to proceed. We will plan most likely to proceed on Friday if renal function stable and platelet count acceptable. He may require platelet transfusion prior to the procedure but will await FU plt counts and guidance from hematology. Would recommend holding lasix Friday morning prior to the procedure. Limit dye and no ventriculogram. Follow renal function closely following procedure. Hopefully can proceed with valve surgery early next week. 2 history of paroxysmal atrial fibrillation with post-termination pauses -anticoagulation is on hold given thrombocytopenia. Pacemaker has been removed. Telemetry shows sinus rhythm with first degree AV block. Will require Pacemaker replacement once he recovers from valve surgery. 3 stage III chronic kidney disease-follow renal function closely given use of gentamicin and upcoming catheterization and valve surgery. 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 Hematology following and managing. 5 hypertension-given aortic insufficiency and mitral regurgitation I would like for his blood pressure to be lower. We will avoid beta blockade given history of bradycardia. Avoid ACE inhibitors given renal insufficiency. I will add low-dose Norvasc. 6 chronic diastolic heart failure-patient is volume overloaded on examination. His heart failure is most likely from his mitral regurgitation and aortic insufficiency. I will continue present dose of Lasix  for now. I will not increase as I would like to keep his renal function stable prior to catheterization and aortic valve replacement. Hold Lasix Friday morning prior to catheterization.  Kirk Ruths 07/15/2014, 5:32 PM

## 2014-07-15 NOTE — Progress Notes (Signed)
Came to ambulate for second time today however pt sts he is just too tired. Will f/u in am. Yves Dill CES, ACSM 2:50 PM 07/15/2014

## 2014-07-15 NOTE — Progress Notes (Addendum)
[facility]                                                                              Justin [name] Demographics  Justin [name], is a 80 y.o. male, DOB - 1933-01-03, DXA:[id number]  Admit date - 07/29/2014   Admitting Physician Rigoberto Noel, MD  Outpatient Primary MD for the Justin [name] is Penni Homans, MD  LOS - 9   Chief Complaint  Justin [name] presents with  . Shortness of Breath      HPI on 07/20/2014 80 y.o. male with history of coronary artery disease status post stents, hypertension, dyslipidemia, atrial fibrillation no longer on anticoagulation secondary to recent GI bleed, pacemaker, ITP currently being treated with IVIG by Dr. Marin Olp (most recently 8/3) and prednisone 80mg  daily, presented to the emergency department with complaints of fever and shortness of breath that started 8/2. In ED, Justin [name] found to have leukocytosis, hypotension, and lactic acidosis. Given 1L IVF bolus and started on levophed. Transferred to MICU for suspected sepsis.   Interim history Justin [name] was hypotensive and required pressors. She was found to have septic shock with enterococcus bacteremia, infectious disease was consulted. TEE was recommended which showed a large aortic valve vegetation. 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 Justin [name] was noted to have an episode of TIA, and neurology was consulted. Justin [name]'s pacemaker was removed on 07/26/2014. Cardiothoracic surgery was also consulted for further evaluation.  Assessment & Plan   Enterococcus Bacteremia with Aortic Valve Vegetation:  -Received IV vancomycin for 4 days. Repeated Blood Culture 8-5 no growth to date. WBC trending down. -CT abdomen no mas.  -S/P TEE which showed Large Aortic Valve Vegetation.  -Will hold on placement of PICC line for now until infection better controlled. Will follow ID recommendation to when to order PICC line, will need to be  double lumen per Dr Marin Olp request.  -Vancomycin was changed to ampicillin 8-8. Gentamycin added on  8-9.  -HIV non reactive, viral hepatitis negative. .  -Cardiothoracic surgery also consulted  Mental status/Transient Hallucination -Occurred on 07/09/2014, resolved -May have been secondary to medications versus delirium -CT of the head was negative  Transient hand weakness, numbness, likely TIA -Carotid Duplex: Bilateral: 1-39% ICA stenosis. Vertebral artery flow is antegrade. Right subclavian artery demonstrates significant stenosis compared to left subclavian artery. Medical management for subclavian artery stenosis.  -Hemoglobin A1c 5.6 -Continue with Lipitor. Follow LFT -TEE showed Aortic valve vegetation, treatment and plan as above  Possible pneumonia vs TRALI from IVIG  -Bilateral effusions - stable   Diarrhea -Resolved.   Elevated troponin -in setting sepsis.  -ECHO normal Wall motion.  -Cardiology following, Justin [name] is high-risk for anticoagulation due to to GI bleed  Enterobacter Urine:  -30,000 colonies.   Chronic diastolic heart failure - on lasix at home  -Continue lasix (80 mg ) on 8-5.  -Dyspnea resolved  Severe Sepsis/Septic Shock -Secondary to Enterococcus bacteremia.   -Septic shock resolved, Justin [name] did require use of pressors, Levophed -Continue antibiotics as stated above -Blood pressure currently stable  Acute kidney injury -in setting of infection. Monitor on Lasix. Good urine out put.  -Continue  to monitor her creatinine  Thrombocytopenia,  -History of ITP, first IVIG treatment 8/3, on chronic prednisone.  -Platelet Improving, 41  -Dr. Marin Olp following and appreciated -Received one dose of Nplate and received epogen on 8/8  Suspected Adrenal insufficiency ( chronic steroids )  -Continue with prednisone.   History of paroxysmal atrial fibrillation  -Currently in sinus rhythm   Code Status: Full  Family Communication: Family at  bedside  Disposition Plan: Admitted, pending further increase in platelets, and surgery.  Time Spent in minutes   30 minutes  Procedures 6/24 TTE >>> LVEF 55%, Grade 2 DD  8/3 CT chest >>> bilateral effusions, bilateral GGO, several nodular opacities  8/4 ECHO >>> Mild to mod MR, EF 50-55%, G2 Dia Dys Fcn  TEE: normal LV function; severe MR; large aortic valve vegetation with moderate AI; no pacemaker vegetation identified 8/11 Pacemaker extraction  Consults   Cardiology Cardiothoracic surgery Neurology Infectious disease Oncology  DVT Prophylaxis  SCDs  Lab Results  Component Value Date   PLT 41* 07/15/2014    Medications  Scheduled Meds: . ampicillin (OMNIPEN) IV  2 g Intravenous 4 times per day  . atorvastatin  10 mg Oral QPM  . furosemide  80 mg Oral Daily  . gentamicin  80 mg Intravenous Q24H  . insulin aspart  0-9 Units Subcutaneous TID WC  . pantoprazole  40 mg Oral Daily  . predniSONE  80 mg Oral Q breakfast  . pyridOXINE  100 mg Oral QPM  . tamsulosin  0.4 mg Oral QPC supper  . vitamin E  400 Units Oral Daily   Continuous Infusions:  PRN Meds:.sodium chloride, acetaminophen, ondansetron (ZOFRAN) IV, polyvinyl alcohol  Antibiotics    Anti-infectives   Start     Dose/Rate Route Frequency Ordered Stop   07/10/2014 1045  gentamicin (GARAMYCIN) 80 mg in sodium chloride irrigation 0.9 % 500 mL irrigation  Status:  Discontinued     80 mg Irrigation On call 07/25/2014 1031 08/01/2014 1805   07/12/14 1300  gentamicin (GARAMYCIN) IVPB 80 mg     80 mg 100 mL/hr over 30 Minutes Intravenous Every 24 hours 07/12/14 1200     07/11/14 1200  ampicillin (OMNIPEN) 2 g in sodium chloride 0.9 % 50 mL IVPB     2 g 150 mL/hr over 20 Minutes Intravenous 4 times per day 07/11/14 0945     07/07/14 0030  piperacillin-tazobactam (ZOSYN) IVPB 3.375 g  Status:  Discontinued     3.375 g 12.5 mL/hr over 240 Minutes Intravenous Every 8 hours 07/31/2014 2042 07/08/14 1602   07/05/2014 2300   levofloxacin (LEVAQUIN) IVPB 750 mg  Status:  Discontinued     750 mg 100 mL/hr over 90 Minutes Intravenous Every 48 hours 07/10/2014 2232 07/08/14 1022   07/07/2014 1615  piperacillin-tazobactam (ZOSYN) IVPB 3.375 g     3.375 g 100 mL/hr over 30 Minutes Intravenous  Once 07/13/2014 1604 07/18/2014 1755   07/15/2014 1615  vancomycin (VANCOCIN) IVPB 1000 mg/200 mL premix     1,000 mg 200 mL/hr over 60 Minutes Intravenous  Once 07/04/2014 1604 07/17/2014 1930   07/21/2014 0030  vancomycin (VANCOCIN) IVPB 750 mg/150 ml premix  Status:  Discontinued     750 mg 150 mL/hr over 60 Minutes Intravenous Every 12 hours 08/03/2014 2042 07/11/14 0945        Subjective:   Justin [name] seen and examined today.  Justin [name] was to stay out of bed and walk. He denies any chest pain or palpitations  or shortness of breath at this time. Justin [name] continues to complain of lower extremity swelling.   Objective:   Filed Vitals:   07/10/2014 2300 07/15/14 0000 07/15/14 0440 07/15/14 0806  BP:  130/40 163/60 175/59  Pulse:  71    Temp: 98.7 F (37.1 C)  98.1 F (36.7 C) 97.8 F (36.6 C)  TempSrc: Oral  Oral Oral  Resp:  21  21  Height:      Weight:   88.6 kg (195 lb 5.2 oz)   SpO2: 98% 95% 99% 99%    Wt Readings from Last 3 Encounters:  07/15/14 88.6 kg (195 lb 5.2 oz)  07/15/14 88.6 kg (195 lb 5.2 oz)  07/15/14 88.6 kg (195 lb 5.2 oz)     Intake/Output Summary (Last 24 hours) at 07/15/14 1016 Last data filed at 07/15/14 0800  Gross per 24 hour  Intake    270 ml  Output    600 ml  Net   -330 ml    Exam  General: Well developed, well nourished, NAD, appears stated age  HEENT: NCAT, PERRLA, EOMI, Anicteic Sclera, mucous membranes moist.   Neck: Supple, no masses  Cardiovascular: S1 S2 auscultated, 1/6 SEM. Regular rate and rhythm.  Respiratory: Diminished breath sounds, bilateral crackles noted in the bases.  Abdomen: Soft, nontender, nondistended, + bowel sounds  Extremities: warm dry without cyanosis  clubbing. 2+ edema in LE B/L  Neuro: AAOx3, cranial nerves grossly intact. Strength 5/5 in Justin [name]'s upper and lower extremities bilaterally  Skin: Without rashes exudates or nodules  Psych: Normal affect and demeanor with intact judgement and insight  Data Review   Micro Results Recent Results (from the past 240 hour(s))  CULTURE, BLOOD (ROUTINE X 2)     Status: None   Collection Time    07/09/2014  3:30 PM      Result Value Ref Range Status   Specimen Description BLOOD LEFT ARM   Final   Special Requests BOTTLES DRAWN AEROBIC AND ANAEROBIC [facility]   Final   Culture  Setup Time     Final   Value: 07/30/2014 19:23     Performed at [facility]   Culture     Final   Value: ENTEROCOCCUS SPECIES     Note: SUSCEPTIBILITIES PERFORMED ON PREVIOUS CULTURE WITHIN THE LAST 5 DAYS.     Note: Gram Stain Report Called to,Read Back By and Verified With: JAMES ARTIS 07/07/14 AT 0730 RIDK     Performed at [facility]   Report Status 07/09/2014 FINAL   Final  URINE CULTURE     Status: None   Collection Time    07/18/2014  4:18 PM      Result Value Ref Range Status   Specimen Description URINE, CLEAN CATCH   Final   Special Requests NONE   Final   Culture  Setup Time     Final   Value: 07/07/2014 02:46     Performed at [place]     Final   Value: 35,000 COLONIES/ML     Performed at [facility]   Culture     Final   Value: ENTEROBACTER CLOACAE     Performed at [facility]   Report Status 07/09/2014 FINAL   Final   Organism ID, Bacteria ENTEROBACTER CLOACAE   Final  CULTURE, BLOOD (ROUTINE X 2)     Status: None   Collection Time    07/05/2014  4:30 PM  Result Value Ref Range Status   Specimen Description BLOOD R ARM   Final   Special Requests BOTTLES DRAWN AEROBIC AND ANAEROBIC [facility] EACH   Final   Culture  Setup Time     Final   Value: 07/20/2014 19:22     Performed at [facility]   Culture     Final   Value:  ENTEROCOCCUS SPECIES     Note: Gram Stain Report Called to,Read Back By and Verified With: JAMES ARTIS 07/07/14 AT 0730 RIDK     Performed at [facility]   Report Status 07/09/2014 FINAL   Final   Organism ID, Bacteria ENTEROCOCCUS SPECIES   Final  MRSA PCR SCREENING     Status: None   Collection Time    08/01/2014  7:52 PM      Result Value Ref Range Status   MRSA by PCR NEGATIVE  NEGATIVE Final   Comment:            The GeneXpert MRSA Assay (FDA     approved for NASAL specimens     only), is one component of a     comprehensive MRSA colonization     surveillance program. It is not     intended to diagnose MRSA     infection nor to guide or     monitor treatment for     MRSA infections.  CULTURE, BLOOD (ROUTINE X 2)     Status: None   Collection Time    07/08/14 12:00 PM      Result Value Ref Range Status   Specimen Description BLOOD RIGHT HAND   Final   Special Requests BOTTLES DRAWN AEROBIC ONLY [facility]   Final   Culture  Setup Time     Final   Value: 07/08/2014 17:14     Performed at [facility]   Culture     Final   Value: NO GROWTH 5 DAYS     Performed at [facility]   Report Status 07/19/2014 FINAL   Final  CULTURE, BLOOD (ROUTINE X 2)     Status: None   Collection Time    07/08/14 12:10 PM      Result Value Ref Range Status   Specimen Description BLOOD LEFT HAND   Final   Special Requests BOTTLES DRAWN AEROBIC ONLY [place]   Final   Culture  Setup Time     Final   Value: 07/08/2014 17:05     Performed at [facility]   Culture     Final   Value: NO GROWTH 5 DAYS     Performed at [facility]   Report Status 07/15/2014 FINAL   Final  FECAL OCCULT BLOOD, IMMUNOCHEMICAL     Status: None   Collection Time    07/13/14  5:24 PM      Result Value Ref Range Status   Fecal Occult Bld Negative  Negative Final  MRSA PCR SCREENING     Status: None   Collection Time    07/17/2014  6:17 PM      Result Value Ref Range Status   MRSA by PCR  NEGATIVE  NEGATIVE Final   Comment:            The GeneXpert MRSA Assay (FDA     approved for NASAL specimens     only), is one component of a     comprehensive MRSA colonization     surveillance program. It is not  intended to diagnose MRSA     infection nor to guide or     monitor treatment for     MRSA infections.    Radiology Reports Ct Abdomen Pelvis Wo Contrast  07/09/2014   CLINICAL DATA:  Enterococcus bacteremia. Evaluate for abdominal source. Evaluate for potential tumor. Enterobacter is also present in the urine.  EXAM: CT ABDOMEN AND PELVIS WITHOUT CONTRAST  TECHNIQUE: Multidetector CT imaging of the abdomen and pelvis was performed following the standard protocol without IV contrast.  COMPARISON:  No priors.  FINDINGS: Lung Bases: Atherosclerotic calcifications in the right coronary artery. Pacemaker lead terminating in the right ventricular apex. Moderate-sized hiatal hernia. Moderate to large bilateral pleural effusions which appear to layer dependently and are associated with extensive passive atelectasis in the visualized portions of the lower lobes of the lungs bilaterally.  Abdomen/Pelvis: 9 mm low attenuation lesion in segment 2 of the liver is incompletely characterized on today's noncontrast CT examination, but is unchanged, and favored to represent a tiny cyst. No other focal hepatic lesions are identified on today's non contrast CT examination. There is a tiny calcifications in the head of the pancreas (image 31 of series 2) which appears to be immediately posterior to the distal common bile duct, potentially related to prior episodes of pancreatitis. Other small calcifications are also noted in the head and tail of the pancreas as well. The calcification in the head of the pancreas is not favored to be within the common bile duct, as the common bile duct is normal in caliber measuring 4 mm, and there is no intrahepatic biliary ductal dilatation to suggest obstruction. The  unenhanced appearance of the gallbladder, spleen and bilateral adrenal glands is unremarkable. There is bilateral perinephric stranding which is nonspecific. 1.5 cm intermediate attenuation lesion extending exophytically off the lateral aspect of the upper pole of the right kidney is incompletely characterized on today's non contrast CT examination, but is statistically likely to represent a mildly proteinaceous cyst.  Normal appendix. Trace volume of ascites. No pneumoperitoneum. No pathologic distention of small bowel. 10 mm short axis inguinal lymph nodes bilaterally are nonspecific. No other definite lymphadenopathy identified in the abdomen or pelvis on today's non contrast CT examination. Mild diffuse mesenteric edema. Prostate gland is enlarged measuring 4.9 x 6.1 cm. Urinary bladder is generally unremarkable in appearance, however, the attenuation values in the urine are rather high ranging from 45-55 HU, suggesting proteinaceous or hemorrhagic contents.  Musculoskeletal: Mild diffuse body wall edema. There are no aggressive appearing lytic or blastic lesions noted in the visualized portions of the skeleton.  IMPRESSION: 1. No definite source for bacteremia identified on today's examination. 2. Relatively high attenuation of the urine is of uncertain etiology and significance, but may suggest some proteinaceous or hemorrhagic contents. 3. Findings suggestive of anasarca, including moderate to large bilateral pleural effusions, trace volume of ascites, mild diffuse mesenteric edema and diffuse body wall edema. 4. Prostatomegaly. 5. Additional incidental findings, as above.   Electronically Signed   By: Vinnie Langton M.D.   On: 07/09/2014 12:46   Dg Chest 2 View  07/15/2014   CLINICAL DATA:  Pacemaker extraction  EXAM: CHEST  2 VIEW  COMPARISON:  07/07/2014  FINDINGS: Cardiac pacer has been removed with generator and 2 leads absent. Mild interstitial change in peribronchial cuffing. These findings are  less prominent when compared to the prior study. There is consolidation in the medial left lung base which appears most consistent with atelectasis.  IMPRESSION: 1.  Mild residual interstitial change may reflect minimal remaining interstitial pulmonary edema 2. Left lower lobe atelectasis   Electronically Signed   By: Skipper Cliche M.D.   On: 07/15/2014 08:15   Dg Chest 2 View  07/21/2014   CLINICAL DATA:  Fever, chills, shortness of breath.  EXAM: CHEST  2 VIEW  COMPARISON:  06/10/2014  FINDINGS: Left pacer in place with leads in the right atrium and right ventricle. Heart is normal size. Mild peribronchial thickening. No confluent airspace opacities or effusions. No acute bony abnormality.  IMPRESSION: Bronchitic changes.   Electronically Signed   By: Rolm Baptise M.D.   On: 07/04/2014 15:07   Ct Head Wo Contrast  07/09/2014   CLINICAL DATA:  Hallucinations.  Thrombocytopenia  EXAM: CT HEAD WITHOUT CONTRAST  TECHNIQUE: Contiguous axial images were obtained from the base of the skull through the vertex without intravenous contrast.  COMPARISON:  None.  FINDINGS: Mild atrophy. Negative for hemorrhage. Negative for acute infarct or mass. Calvarium intact.  IMPRESSION: No acute abnormality.   Electronically Signed   By: Franchot Gallo M.D.   On: 07/09/2014 18:47   Ct Angio Chest W/cm &/or Wo Cm  07/28/2014   CLINICAL DATA:  Shortness of breath and fever  EXAM: CT ANGIOGRAPHY CHEST WITH CONTRAST  TECHNIQUE: Multidetector CT imaging of the chest was performed using the standard protocol during bolus administration of intravenous contrast. Multiplanar CT image reconstructions and MIPs were obtained to evaluate the vascular anatomy.  CONTRAST:  70mL OMNIPAQUE IOHEXOL 350 MG/ML SOLN  COMPARISON:  Chest radiograph July 06, 2014  FINDINGS: There is no demonstrable pulmonary embolus. There is no thoracic aortic aneurysm or dissection.  There are free-flowing pleural effusions bilaterally. There is hazy ground-glass  opacity diffusely.  On axial CT slice 55 series 6, there is a 6 x 4 mm nodular opacity in the medial segment of the right middle lobe. On axial slice 64 series 6, there is a 6 x 4 mm nodular opacity in the lateral segment of the left lower lobe. On axial slice 62 series 6, there is a 7 x 5 mm nodular opacity in the lateral segment of the right middle lobe. On axial slice 69 series 6, there is a 5 x 4 mm nodular opacity in the superior segment of the right lower lobe.  There is no appreciable thoracic adenopathy. Pericardium is not thickened. Pacemaker leads are attached to the right atrium and right ventricle. There is a focal hiatal hernia.  Visualized upper abdominal structures appear unremarkable. There are no blastic or lytic bone lesions. There is degenerative change in the thoracic spine. Thyroid appears unremarkable.  Review of the MIP images confirms the above findings.  IMPRESSION: No demonstrable pulmonary embolus.  Bilateral effusions with extensive ground-glass opacity bilaterally. Suspect alveolar edema with findings consistent with congestive heart failure. Atypical infection or allergic type phenomenon are differential considerations, however.  Several nodular opacities, largest measuring 7 mm. Followup of these nodular opacity should be based on Fleischner Society guidelines. If the Justin [name] is at high risk for bronchogenic carcinoma, follow-up chest CT at 3-64months is recommended. If the Justin [name] is at low risk for bronchogenic carcinoma, follow-up chest CT at 6-12 months is recommended. This recommendation follows the consensus statement: Guidelines for Management of Small Pulmonary Nodules Detected on CT Scans: A Statement from the [place] as published in Radiology 2005; 237:395-400.  Focal hiatal hernia.  No adenopathy.   Electronically Signed   By: Lowella Grip M.D.  On: 07/15/2014 18:09   Dg Chest Port 1 View  07/07/2014   CLINICAL DATA:  Evaluate airspace.  EXAM: PORTABLE  CHEST - 1 VIEW  COMPARISON:  CT 08/03/2014.  Chest x-ray 07/08/2014 and 06/10/2014.  FINDINGS: Cardiac pacer with lead tips in right atrium and right ventricle. Mediastinum and hilar structures are unremarkable. Cardiomegaly with mild pulmonary vascular prominence and interstitial prominence consistent with congestive heart failure. Small pleural effusions. No pneumothorax. Reference is made to recent chest CT which demonstrated small pulmonary nodules. These are not definitely identified by chest x-ray. No acute bony abnormality.  IMPRESSION: 1. Persistent changes of congestive heart failure pulmonary edema. Small pleural effusions. 2. Reference is made to recent chest CT which demonstrated small pulmonary nodules. Follow-up is recommended as on prior CT report.   Electronically Signed   By: Marcello Moores  Register   On: 07/07/2014 07:26    CBC  Recent Labs Lab 07/11/14 0345 07/12/14 0430 07/13/14 0415 07/13/2014 0537 07/15/14 0317  WBC 12.1* 12.0* 11.1* 11.4* 8.6  HGB 9.4* 9.4* 9.6* 10.4* 10.1*  HCT 28.7* 28.3* 28.9* 32.2* 31.8*  PLT 8* 10* 18* 19* 41*  MCV 97.3 97.3 98.0 101.6* 101.9*  MCH 31.9 32.3 32.5 32.8 32.4  MCHC 32.8 33.2 33.2 32.3 31.8  RDW 18.4* 19.6* 20.7* 21.6* 23.0*    Chemistries   Recent Labs Lab 07/11/14 0345 07/12/14 0430 07/13/14 0415 07/15/2014 0537 07/15/14 0317  NA 141 141 142 142 141  K 3.7 3.6* 3.5* 3.7 4.0  CL 105 105 104 104 103  CO2 21 25 24 23 24   GLUCOSE 137* 109* 109* 103* 106*  BUN 39* 38* 35* 32* 31*  CREATININE 1.43* 1.56* 1.50* 1.36* 1.41*  CALCIUM 8.3* 8.4 8.1* 8.2* 8.3*  AST 56* 69* 71* 56* 50*  ALT 47 53 60* 54* 47  ALKPHOS 68 56 61 62 64  BILITOT 0.9 0.8 0.9 0.8 0.7   ------------------------------------------------------------------------------------------------------------------ estimated creatinine clearance is 45.2 ml/min (by C-G formula based on Cr of  1.41). ------------------------------------------------------------------------------------------------------------------ No results found for this basename: HGBA1C,  in the last 72 hours ------------------------------------------------------------------------------------------------------------------ No results found for this basename: CHOL, HDL, LDLCALC, TRIG, CHOLHDL, LDLDIRECT,  in the last 72 hours ------------------------------------------------------------------------------------------------------------------ No results found for this basename: TSH, T4TOTAL, FREET3, T3FREE, THYROIDAB,  in the last 72 hours ------------------------------------------------------------------------------------------------------------------ No results found for this basename: VITAMINB12, FOLATE, FERRITIN, TIBC, IRON, RETICCTPCT,  in the last 72 hours  Coagulation profile No results found for this basename: INR, PROTIME,  in the last 168 hours  No results found for this basename: DDIMER,  in the last 72 hours  Cardiac Enzymes No results found for this basename: CK, CKMB, TROPONINI, MYOGLOBIN,  in the last 168 hours ------------------------------------------------------------------------------------------------------------------ No components found with this basename: POCBNP,     Jama Mcmiller D.O. on 07/15/2014 at 10:16 AM  Between 7am to 7pm - Pager - [phone]  After 7pm go to [email or web address] - password [email or web address]  And look for the night coverage person covering for me after hours  [facility] Office  [phone]

## 2014-07-15 NOTE — Progress Notes (Signed)
Patient ID: Justin [name], male   DOB: [birth year], 80 y.o.   MRN: [id number]         [facility]    Date of Admission:  07/10/2014    Total days of antibiotics 10        Day 4 ampicillin        Day 4 gentamicin         Principal Problem:   Enterococcal bacteremia Active Problems:   CAD (coronary artery disease)   Anemia   Thrombocytopenia   Pacemaker-[employer]   Mitral regurgitation   Sepsis   TIA (transient ischemic attack)   . ampicillin (OMNIPEN) IV  2 g Intravenous 4 times per day  . atorvastatin  10 mg Oral QPM  . furosemide  80 mg Oral Daily  . gentamicin  80 mg Intravenous Q24H  . insulin aspart  0-9 Units Subcutaneous TID WC  . pantoprazole  40 mg Oral Daily  . predniSONE  80 mg Oral Q breakfast  . pyridOXINE  100 mg Oral QPM  . tamsulosin  0.4 mg Oral QPC supper  . vitamin E  400 Units Oral Daily    Subjective: He states that he is feeling markedly better than he was on admission.  Past Medical History  Diagnosis Date  . CAD (coronary artery disease) prior stenting 1999   . Dyslipidemia   . HTN (hypertension)   . WPW (Wolff-Parkinson-White syndrome) loss of preexcitation   . Atrial fibrillation   . AV block, Mobitz II   . Presyncope   . Myocardial infarction   . Pacemaker 04/07/2013  . Arthritis   . History of chicken pox   . Anemia   . Hypernatremia 06/03/2013  . Thrombocytopenia, unspecified 06/03/2013  . Leukopenia 06/03/2013  . Obstructive sleep apnea 06/03/2013    USES CPAP  . Urinary incontinence 06/03/2013  . Hyperglycemia 12/21/2013  . Pancytopenia 06/03/2013  . Iron deficiency anemia, unspecified 07/02/2014  . Malabsorption of iron 07/02/2014  . Hypotestosteronemia 07/02/2014  . ITP (idiopathic thrombocytopenic purpura) 07/02/2014    History  Substance Use Topics  . Smoking status: Former Smoker -- 1.00 packs/day for 36 years    Types: Cigarettes    Start date: 01/25/1955    Quit date: 12/05/1991  . Smokeless tobacco:  Never Used     Comment: quit smoking 22 yeras ago  . Alcohol Use: Yes     Comment: Occasional    Family History  Problem Relation Age of Onset  . Stroke Mother   . Hypertension Mother   . Stroke Sister   . Arthritis Sister     rheumatoid  . Heart attack Sister   . Anemia Sister   . Other Brother     tube put in aorta  . Anemia Brother   . Other Son     cortisone deficiency  . Arthritis Son   . Stroke Brother   . Alcohol abuse Brother   . Barrett's esophagus Son   . Colon cancer Maternal Grandmother   . Colon cancer Maternal Grandfather     No Known Allergies  Objective: Temp:  [97.8 F (36.6 C)-98.9 F (37.2 C)] 98.3 F (36.8 C) (08/12 1127) Pulse Rate:  [67-81] 71 (08/12 0000) Resp:  [13-26] 21 (08/12 1127) BP: (127-175)/(40-119) 156/47 mmHg (08/12 1127) SpO2:  [95 %-100 %] 98 % (08/12 1127) Weight:  [195 lb 5.2 oz (88.6 kg)] 195 lb 5.2 oz (88.6 kg) (08/12 0440)  General: He is up walking  in his room with his walker Skin: No rash. No splinter or conjunctival hemorrhages Lungs: Clear Cor: No change in 2/6 systolic murmur  Lab Results Lab Results  Component Value Date   WBC 8.6 07/15/2014   HGB 10.1* 07/15/2014   HCT 31.8* 07/15/2014   MCV 101.9* 07/15/2014   PLT 41* 07/15/2014    Lab Results  Component Value Date   CREATININE 1.41* 07/15/2014   BUN 31* 07/15/2014   NA 141 07/15/2014   K 4.0 07/15/2014   CL 103 07/15/2014   CO2 24 07/15/2014    Lab Results  Component Value Date   ALT 47 07/15/2014   AST 50* 07/15/2014   ALKPHOS 64 07/15/2014   BILITOT 0.7 07/15/2014      Microbiology: Recent Results (from the past 240 hour(s))  CULTURE, BLOOD (ROUTINE X 2)     Status: None   Collection Time    07/10/2014  3:30 PM      Result Value Ref Range Status   Specimen Description BLOOD LEFT ARM   Final   Special Requests BOTTLES DRAWN AEROBIC AND ANAEROBIC [facility]   Final   Culture  Setup Time     Final   Value: 07/20/2014 19:23     Performed at [facility]     Culture     Final   Value: ENTEROCOCCUS SPECIES     Note: SUSCEPTIBILITIES PERFORMED ON PREVIOUS CULTURE WITHIN THE LAST 5 DAYS.     Note: Gram Stain Report Called to,Read Back By and Verified With: JAMES ARTIS 07/07/14 AT 0730 RIDK     Performed at [facility]   Report Status 07/09/2014 FINAL   Final  URINE CULTURE     Status: None   Collection Time    07/30/2014  4:18 PM      Result Value Ref Range Status   Specimen Description URINE, CLEAN CATCH   Final   Special Requests NONE   Final   Culture  Setup Time     Final   Value: 07/07/2014 02:46     Performed at [place]     Final   Value: 35,000 COLONIES/ML     Performed at [facility]   Culture     Final   Value: ENTEROBACTER CLOACAE     Performed at [facility]   Report Status 07/09/2014 FINAL   Final   Organism ID, Bacteria ENTEROBACTER CLOACAE   Final  CULTURE, BLOOD (ROUTINE X 2)     Status: None   Collection Time    07/12/2014  4:30 PM      Result Value Ref Range Status   Specimen Description BLOOD R ARM   Final   Special Requests BOTTLES DRAWN AEROBIC AND ANAEROBIC [place]   Final   Culture  Setup Time     Final   Value: 08/01/2014 19:22     Performed at [facility]   Culture     Final   Value: ENTEROCOCCUS SPECIES     Note: Gram Stain Report Called to,Read Back By and Verified With: JAMES ARTIS 07/07/14 AT 0730 RIDK     Performed at [facility]   Report Status 07/09/2014 FINAL   Final   Organism ID, Bacteria ENTEROCOCCUS SPECIES   Final  MRSA PCR SCREENING     Status: None   Collection Time    07/09/2014  7:52 PM      Result Value Ref Range Status  MRSA by PCR NEGATIVE  NEGATIVE Final   Comment:            The GeneXpert MRSA Assay (FDA     approved for NASAL specimens     only), is one component of a     comprehensive MRSA colonization     surveillance program. It is not     intended to diagnose MRSA     infection nor to guide or     monitor  treatment for     MRSA infections.  CULTURE, BLOOD (ROUTINE X 2)     Status: None   Collection Time    07/08/14 12:00 PM      Result Value Ref Range Status   Specimen Description BLOOD RIGHT HAND   Final   Special Requests BOTTLES DRAWN AEROBIC ONLY [facility]   Final   Culture  Setup Time     Final   Value: 07/08/2014 17:14     Performed at [facility]   Culture     Final   Value: NO GROWTH 5 DAYS     Performed at [facility]   Report Status 07/13/2014 FINAL   Final  CULTURE, BLOOD (ROUTINE X 2)     Status: None   Collection Time    07/08/14 12:10 PM      Result Value Ref Range Status   Specimen Description BLOOD LEFT HAND   Final   Special Requests BOTTLES DRAWN AEROBIC ONLY [place]   Final   Culture  Setup Time     Final   Value: 07/08/2014 17:05     Performed at [facility]   Culture     Final   Value: NO GROWTH 5 DAYS     Performed at [facility]   Report Status 07/08/2014 FINAL   Final  FECAL OCCULT BLOOD, IMMUNOCHEMICAL     Status: None   Collection Time    07/13/14  5:24 PM      Result Value Ref Range Status   Fecal Occult Bld Negative  Negative Final  MRSA PCR SCREENING     Status: None   Collection Time    07/05/2014  6:17 PM      Result Value Ref Range Status   MRSA by PCR NEGATIVE  NEGATIVE Final   Comment:            The GeneXpert MRSA Assay (FDA     approved for NASAL specimens     only), is one component of a     comprehensive MRSA colonization     surveillance program. It is not     intended to diagnose MRSA     infection nor to guide or     monitor treatment for     MRSA infections.    Studies/Results: Dg Chest 2 View  07/15/2014   CLINICAL DATA:  Pacemaker extraction  EXAM: CHEST  2 VIEW  COMPARISON:  07/07/2014  FINDINGS: Cardiac pacer has been removed with generator and 2 leads absent. Mild interstitial change in peribronchial cuffing. These findings are less prominent when compared to the prior study. There is  consolidation in the medial left lung base which appears most consistent with atelectasis.  IMPRESSION: 1. Mild residual interstitial change may reflect minimal remaining interstitial pulmonary edema 2. Left lower lobe atelectasis   Electronically Signed   By: Skipper Cliche M.D.   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 Repeat blood cultures are negative. It is safe to have a PICC placed now.  Plan: 1. Continue current antibiotics 2. Monitor gentamicin levels and renal function closely 3. Double lumen PICC placement  Michel Bickers, [place] [facility] [place] [phone] pager   [phone] cell 07/15/2014, 12:34 PM

## 2014-07-15 NOTE — Progress Notes (Signed)
ANTIBIOTIC CONSULT NOTE - FOLLOW UP  Pharmacy Consult for gentamicin Indication: endocarditis  No Known Allergies  Patient Measurements: Height: 5\' 11"  (180.3 cm) Weight: 195 lb 5.2 oz (88.6 kg) IBW/kg (Calculated) : 75.3  Vital Signs: Temp: 98.5 F (36.9 C) (08/12 1702) Temp src: Oral (08/12 1702) BP: 162/51 mmHg (08/12 1702) Intake/Output from previous day: 08/11 0701 - 08/12 0700 In: 467.3 [I.V.:250; Blood:13.3; IV Piggyback:204] Out: 1450 [Urine:1450] Intake/Output from this shift:    Labs:  Recent Labs  07/13/14 0415 07/29/2014 0537 07/15/14 0317  WBC 11.1* 11.4* 8.6  HGB 9.6* 10.4* 10.1*  PLT 18* 19* 41*  CREATININE 1.50* 1.36* 1.41*   Estimated Creatinine Clearance: 45.2 ml/min (by C-G formula based on Cr of 1.41).  Recent Labs  07/15/14 1253 07/15/14 1430  GENTTROUGH <0.5*  --   GENTPEAK  --  2.5*     Microbiology: Recent Results (from the past 720 hour(s))  CULTURE, BLOOD (ROUTINE X 2)     Status: None   Collection Time    07/19/2014  3:30 PM      Result Value Ref Range Status   Specimen Description BLOOD LEFT ARM   Final   Special Requests BOTTLES DRAWN AEROBIC AND ANAEROBIC [facility]   Final   Culture  Setup Time     Final   Value: 07/30/2014 19:23     Performed at [facility]   Culture     Final   Value: ENTEROCOCCUS SPECIES     Note: SUSCEPTIBILITIES PERFORMED ON PREVIOUS CULTURE WITHIN THE LAST 5 DAYS.     Note: Gram Stain Report Called to,Read Back By and Verified With: JAMES ARTIS 07/07/14 AT 0730 RIDK     Performed at [facility]   Report Status 07/09/2014 FINAL   Final  URINE CULTURE     Status: None   Collection Time    07/10/2014  4:18 PM      Result Value Ref Range Status   Specimen Description URINE, CLEAN CATCH   Final   Special Requests NONE   Final   Culture  Setup Time     Final   Value: 07/07/2014 02:46     Performed at [place]     Final   Value: 35,000 COLONIES/ML     Performed at  [facility]   Culture     Final   Value: ENTEROBACTER CLOACAE     Performed at [facility]   Report Status 07/09/2014 FINAL   Final   Organism ID, Bacteria ENTEROBACTER CLOACAE   Final  CULTURE, BLOOD (ROUTINE X 2)     Status: None   Collection Time    07/08/2014  4:30 PM      Result Value Ref Range Status   Specimen Description BLOOD R ARM   Final   Special Requests BOTTLES DRAWN AEROBIC AND ANAEROBIC [place]   Final   Culture  Setup Time     Final   Value: 07/18/2014 19:22     Performed at [facility]   Culture     Final   Value: ENTEROCOCCUS SPECIES     Note: Gram Stain Report Called to,Read Back By and Verified With: JAMES ARTIS 07/07/14 AT 0730 RIDK     Performed at [facility]   Report Status 07/09/2014 FINAL   Final   Organism ID, Bacteria ENTEROCOCCUS SPECIES   Final  MRSA PCR SCREENING     Status: None  Collection Time    07/05/2014  7:52 PM      Result Value Ref Range Status   MRSA by PCR NEGATIVE  NEGATIVE Final   Comment:            The GeneXpert MRSA Assay (FDA     approved for NASAL specimens     only), is one component of a     comprehensive MRSA colonization     surveillance program. It is not     intended to diagnose MRSA     infection nor to guide or     monitor treatment for     MRSA infections.  CULTURE, BLOOD (ROUTINE X 2)     Status: None   Collection Time    07/08/14 12:00 PM      Result Value Ref Range Status   Specimen Description BLOOD RIGHT HAND   Final   Special Requests BOTTLES DRAWN AEROBIC ONLY [facility]   Final   Culture  Setup Time     Final   Value: 07/08/2014 17:14     Performed at [facility]   Culture     Final   Value: NO GROWTH 5 DAYS     Performed at [facility]   Report Status 08/03/2014 FINAL   Final  CULTURE, BLOOD (ROUTINE X 2)     Status: None   Collection Time    07/08/14 12:10 PM      Result Value Ref Range Status   Specimen Description BLOOD LEFT HAND   Final   Special  Requests BOTTLES DRAWN AEROBIC ONLY [place]   Final   Culture  Setup Time     Final   Value: 07/08/2014 17:05     Performed at [facility]   Culture     Final   Value: NO GROWTH 5 DAYS     Performed at [facility]   Report Status 07/31/2014 FINAL   Final  FECAL OCCULT BLOOD, IMMUNOCHEMICAL     Status: None   Collection Time    07/13/14  5:24 PM      Result Value Ref Range Status   Fecal Occult Bld Negative  Negative Final  MRSA PCR SCREENING     Status: None   Collection Time    07/16/2014  6:17 PM      Result Value Ref Range Status   MRSA by PCR NEGATIVE  NEGATIVE Final   Comment:            The GeneXpert MRSA Assay (FDA     approved for NASAL specimens     only), is one component of a     comprehensive MRSA colonization     surveillance program. It is not     intended to diagnose MRSA     infection nor to guide or     monitor treatment for     MRSA infections.    Anti-infectives   Start     Dose/Rate Route Frequency Ordered Stop   07/16/14 1300  gentamicin (GARAMYCIN) IVPB 100 mg     100 mg 200 mL/hr over 30 Minutes Intravenous Every 24 hours 07/15/14 1917     07/21/2014 1045  gentamicin (GARAMYCIN) 80 mg in sodium chloride irrigation 0.9 % 500 mL irrigation  Status:  Discontinued     80 mg Irrigation On call 07/23/2014 1031 07/05/2014 1805   07/12/14 1300  gentamicin (GARAMYCIN) IVPB 80 mg  Status:  Discontinued     80 mg  100 mL/hr over 30 Minutes Intravenous Every 24 hours 07/12/14 1200 07/15/14 1917   07/11/14 1200  ampicillin (OMNIPEN) 2 g in sodium chloride 0.9 % 50 mL IVPB     2 g 150 mL/hr over 20 Minutes Intravenous 4 times per day 07/11/14 0945     07/07/14 0030  piperacillin-tazobactam (ZOSYN) IVPB 3.375 g  Status:  Discontinued     3.375 g 12.5 mL/hr over 240 Minutes Intravenous Every 8 hours 07/09/2014 2042 07/08/14 1602   07/12/2014 2300  levofloxacin (LEVAQUIN) IVPB 750 mg  Status:  Discontinued     750 mg 100 mL/hr over 90 Minutes Intravenous Every  48 hours 07/11/2014 2232 07/08/14 1022   07/26/2014 1615  piperacillin-tazobactam (ZOSYN) IVPB 3.375 g     3.375 g 100 mL/hr over 30 Minutes Intravenous  Once 07/13/2014 1604 07/14/2014 1755   07/05/2014 1615  vancomycin (VANCOCIN) IVPB 1000 mg/200 mL premix     1,000 mg 200 mL/hr over 60 Minutes Intravenous  Once 07/16/2014 1604 07/31/2014 1930   07/09/2014 0030  vancomycin (VANCOCIN) IVPB 750 mg/150 ml premix  Status:  Discontinued     750 mg 150 mL/hr over 60 Minutes Intravenous Every 12 hours 07/29/2014 2042 07/11/14 0945      Assessment: 80 yo male admitted on 8/3 for shortness of breath.  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  Patient is now s/p pacemaker removal and plan for valve replacement next Monday pending renal function and platelet count.  Pharmacy is consulted to dose and adjust gentamicin.  Patient's serum creatinine has been improving since admission with a slight bump up this AM from 1.36 to 1.41.  Patient is currently being treated with 80 mg IV q24hr of gentamicin.  A peak drawn this afternoon was 2.5 and a trough drawn this afternoon was <0.5.  The peak was drawn 30 minutes late, so the level recorded may be slightly lower than actual serum peak level.  The trough was drawn appropriately.   Gent peak 8/12 @ 1430: 2.5 Gent trough 8/12 @ 1253: <0.5  Patient is scheduled for a cardiac catherization on Friday before valve replacement early next week.  Since patient has had fluctuating renal function and plans for catherization soon (contrast dye), will increase his dose just slightly to 100 mg IV q24hr.  This should provide a peak of ~3.7 and a trough <0.5.  Goal of Therapy:  Gentamicin trough level <1 mcg/ml Gentamicin peak level 3-4 mcg/ml  Plan:  Change gentamicin to 100 mg IV q24hr Watch renal function and cardiology plans for catherization Will obtain another level when clinically indicated  Thank you for allowing pharmacy to be a part  of this patient's care.  Cassie L. Nicole Kindred, PharmD Clinical Pharmacy Resident Pager: [phone] 07/15/2014 7:28 PM

## 2014-07-15 NOTE — Progress Notes (Signed)
Patient ID: Justin [name], male   DOB: 1933-05-02, 80 y.o.   MRN: [id number]   Patient Name: Justin [name] Date of Encounter: 07/15/2014     Principal Problem:   Enterococcal bacteremia Active Problems:   CAD (coronary artery disease)   Anemia   Thrombocytopenia   Pacemaker-[employer]   Mitral regurgitation   Sepsis   TIA (transient ischemic attack)    SUBJECTIVE  No chest pain or sob. Minimal incisional soreness.  CURRENT MEDS . ampicillin (OMNIPEN) IV  2 g Intravenous 4 times per day  . atorvastatin  10 mg Oral QPM  . furosemide  80 mg Oral Daily  . gentamicin  80 mg Intravenous Q24H  . insulin aspart  0-9 Units Subcutaneous TID WC  . pantoprazole  40 mg Oral Daily  . predniSONE  80 mg Oral Q breakfast  . pyridOXINE  100 mg Oral QPM  . tamsulosin  0.4 mg Oral QPC supper  . vitamin E  400 Units Oral Daily    OBJECTIVE  Filed Vitals:   07/09/2014 2300 07/15/14 0000 07/15/14 0440 07/15/14 0806  BP:  130/40 163/60 175/59  Pulse:  71    Temp: 98.7 F (37.1 C)  98.1 F (36.7 C) 97.8 F (36.6 C)  TempSrc: Oral  Oral Oral  Resp:  21  21  Height:      Weight:   195 lb 5.2 oz (88.6 kg)   SpO2: 98% 95% 99% 99%    Intake/Output Summary (Last 24 hours) at 07/15/14 0909 Last data filed at 07/15/14 0800  Gross per 24 hour  Intake    270 ml  Output    750 ml  Net   -480 ml   Filed Weights   07/13/14 0457 07/20/2014 0615 07/15/14 0440  Weight: 194 lb 6.4 oz (88.179 kg) 195 lb 5.2 oz (88.6 kg) 195 lb 5.2 oz (88.6 kg)    PHYSICAL EXAM  General: Pleasant, NAD. Neuro: Alert and oriented X 3. Moves all extremities spontaneously. HEENT:  Normal  Neck: Supple without bruits or JVD. Lungs:  Resp regular and unlabored, CTA. No hematoma over PPM insertion site. Heart: RRR no s3, s4, or murmurs. Abdomen: Soft, non-tender, non-distended, BS + x 4.  Extremities: No clubbing, cyanosis or edema. DP/PT/Radials 2+ and equal bilaterally.  Accessory Clinical  Findings  CBC  Recent Labs  07/28/2014 0537 07/15/14 0317  WBC 11.4* 8.6  HGB 10.4* 10.1*  HCT 32.2* 31.8*  MCV 101.6* 101.9*  PLT 19* 41*   Basic Metabolic Panel  Recent Labs  07/27/2014 0537 07/15/14 0317  NA 142 141  K 3.7 4.0  CL 104 103  CO2 23 24  GLUCOSE 103* 106*  BUN 32* 31*  CREATININE 1.36* 1.41*  CALCIUM 8.2* 8.3*   Liver Function Tests  Recent Labs  07/30/2014 0537 07/15/14 0317  AST 56* 50*  ALT 54* 47  ALKPHOS 62 64  BILITOT 0.8 0.7  PROT 6.0 6.0  ALBUMIN 2.3* 2.4*   No results found for this basename: LIPASE, AMYLASE,  in the last 72 hours Cardiac Enzymes No results found for this basename: CKTOTAL, CKMB, CKMBINDEX, TROPONINI,  in the last 72 hours BNP No components found with this basename: POCBNP,  D-Dimer No results found for this basename: DDIMER,  in the last 72 hours Hemoglobin A1C No results found for this basename: HGBA1C,  in the last 72 hours Fasting Lipid Panel No results found for this basename: CHOL, HDL, LDLCALC, TRIG, CHOLHDL, LDLDIRECT,  in  the last 72 hours Thyroid Function Tests No results found for this basename: TSH, T4TOTAL, FREET3, T3FREE, THYROIDAB,  in the last 72 hours  TELE  Nsr with PVC's  Radiology/Studies  Ct Abdomen Pelvis Wo Contrast  07/09/2014   CLINICAL DATA:  Enterococcus bacteremia. Evaluate for abdominal source. Evaluate for potential tumor. Enterobacter is also present in the urine.  EXAM: CT ABDOMEN AND PELVIS WITHOUT CONTRAST  TECHNIQUE: Multidetector CT imaging of the abdomen and pelvis was performed following the standard protocol without IV contrast.  COMPARISON:  No priors.  FINDINGS: Lung Bases: Atherosclerotic calcifications in the right coronary artery. Pacemaker lead terminating in the right ventricular apex. Moderate-sized hiatal hernia. Moderate to large bilateral pleural effusions which appear to layer dependently and are associated with extensive passive atelectasis in the visualized portions  of the lower lobes of the lungs bilaterally.  Abdomen/Pelvis: 9 mm low attenuation lesion in segment 2 of the liver is incompletely characterized on today's noncontrast CT examination, but is unchanged, and favored to represent a tiny cyst. No other focal hepatic lesions are identified on today's non contrast CT examination. There is a tiny calcifications in the head of the pancreas (image 31 of series 2) which appears to be immediately posterior to the distal common bile duct, potentially related to prior episodes of pancreatitis. Other small calcifications are also noted in the head and tail of the pancreas as well. The calcification in the head of the pancreas is not favored to be within the common bile duct, as the common bile duct is normal in caliber measuring 4 mm, and there is no intrahepatic biliary ductal dilatation to suggest obstruction. The unenhanced appearance of the gallbladder, spleen and bilateral adrenal glands is unremarkable. There is bilateral perinephric stranding which is nonspecific. 1.5 cm intermediate attenuation lesion extending exophytically off the lateral aspect of the upper pole of the right kidney is incompletely characterized on today's non contrast CT examination, but is statistically likely to represent a mildly proteinaceous cyst.  Normal appendix. Trace volume of ascites. No pneumoperitoneum. No pathologic distention of small bowel. 10 mm short axis inguinal lymph nodes bilaterally are nonspecific. No other definite lymphadenopathy identified in the abdomen or pelvis on today's non contrast CT examination. Mild diffuse mesenteric edema. Prostate gland is enlarged measuring 4.9 x 6.1 cm. Urinary bladder is generally unremarkable in appearance, however, the attenuation values in the urine are rather high ranging from 45-55 HU, suggesting proteinaceous or hemorrhagic contents.  Musculoskeletal: Mild diffuse body wall edema. There are no aggressive appearing lytic or blastic lesions  noted in the visualized portions of the skeleton.  IMPRESSION: 1. No definite source for bacteremia identified on today's examination. 2. Relatively high attenuation of the urine is of uncertain etiology and significance, but may suggest some proteinaceous or hemorrhagic contents. 3. Findings suggestive of anasarca, including moderate to large bilateral pleural effusions, trace volume of ascites, mild diffuse mesenteric edema and diffuse body wall edema. 4. Prostatomegaly. 5. Additional incidental findings, as above.   Electronically Signed   By: Vinnie Langton M.D.   On: 07/09/2014 12:46   Dg Chest 2 View  07/15/2014   CLINICAL DATA:  Pacemaker extraction  EXAM: CHEST  2 VIEW  COMPARISON:  07/07/2014  FINDINGS: Cardiac pacer has been removed with generator and 2 leads absent. Mild interstitial change in peribronchial cuffing. These findings are less prominent when compared to the prior study. There is consolidation in the medial left lung base which appears most consistent with atelectasis.  IMPRESSION: 1. Mild residual interstitial change may reflect minimal remaining interstitial pulmonary edema 2. Left lower lobe atelectasis   Electronically Signed   By: Skipper Cliche M.D.   On: 07/15/2014 08:15   Dg Chest 2 View  07/21/2014   CLINICAL DATA:  Fever, chills, shortness of breath.  EXAM: CHEST  2 VIEW  COMPARISON:  06/10/2014  FINDINGS: Left pacer in place with leads in the right atrium and right ventricle. Heart is normal size. Mild peribronchial thickening. No confluent airspace opacities or effusions. No acute bony abnormality.  IMPRESSION: Bronchitic changes.   Electronically Signed   By: Rolm Baptise M.D.   On: 07/25/2014 15:07   Ct Head Wo Contrast  07/09/2014   CLINICAL DATA:  Hallucinations.  Thrombocytopenia  EXAM: CT HEAD WITHOUT CONTRAST  TECHNIQUE: Contiguous axial images were obtained from the base of the skull through the vertex without intravenous contrast.  COMPARISON:  None.  FINDINGS:  Mild atrophy. Negative for hemorrhage. Negative for acute infarct or mass. Calvarium intact.  IMPRESSION: No acute abnormality.   Electronically Signed   By: Franchot Gallo M.D.   On: 07/09/2014 18:47   Ct Angio Chest W/cm &/or Wo Cm  07/04/2014   CLINICAL DATA:  Shortness of breath and fever  EXAM: CT ANGIOGRAPHY CHEST WITH CONTRAST  TECHNIQUE: Multidetector CT imaging of the chest was performed using the standard protocol during bolus administration of intravenous contrast. Multiplanar CT image reconstructions and MIPs were obtained to evaluate the vascular anatomy.  CONTRAST:  64mL OMNIPAQUE IOHEXOL 350 MG/ML SOLN  COMPARISON:  Chest radiograph July 06, 2014  FINDINGS: There is no demonstrable pulmonary embolus. There is no thoracic aortic aneurysm or dissection.  There are free-flowing pleural effusions bilaterally. There is hazy ground-glass opacity diffusely.  On axial CT slice 55 series 6, there is a 6 x 4 mm nodular opacity in the medial segment of the right middle lobe. On axial slice 64 series 6, there is a 6 x 4 mm nodular opacity in the lateral segment of the left lower lobe. On axial slice 62 series 6, there is a 7 x 5 mm nodular opacity in the lateral segment of the right middle lobe. On axial slice 69 series 6, there is a 5 x 4 mm nodular opacity in the superior segment of the right lower lobe.  There is no appreciable thoracic adenopathy. Pericardium is not thickened. Pacemaker leads are attached to the right atrium and right ventricle. There is a focal hiatal hernia.  Visualized upper abdominal structures appear unremarkable. There are no blastic or lytic bone lesions. There is degenerative change in the thoracic spine. Thyroid appears unremarkable.  Review of the MIP images confirms the above findings.  IMPRESSION: No demonstrable pulmonary embolus.  Bilateral effusions with extensive ground-glass opacity bilaterally. Suspect alveolar edema with findings consistent with congestive heart failure.  Atypical infection or allergic type phenomenon are differential considerations, however.  Several nodular opacities, largest measuring 7 mm. Followup of these nodular opacity should be based on Fleischner Society guidelines. If the patient is at high risk for bronchogenic carcinoma, follow-up chest CT at 3-22months is recommended. If the patient is at low risk for bronchogenic carcinoma, follow-up chest CT at 6-12 months is recommended. This recommendation follows the consensus statement: Guidelines for Management of Small Pulmonary Nodules Detected on CT Scans: A Statement from the [employer] as published in Radiology 2005; 237:395-400.  Focal hiatal hernia.  No adenopathy.   Electronically Signed   By: Gwyndolyn Saxon  Jasmine December M.D.   On: 07/08/2014 18:09   Dg Chest Port 1 View  07/07/2014   CLINICAL DATA:  Evaluate airspace.  EXAM: PORTABLE CHEST - 1 VIEW  COMPARISON:  CT 07/24/2014.  Chest x-ray 07/26/2014 and 06/10/2014.  FINDINGS: Cardiac pacer with lead tips in right atrium and right ventricle. Mediastinum and hilar structures are unremarkable. Cardiomegaly with mild pulmonary vascular prominence and interstitial prominence consistent with congestive heart failure. Small pleural effusions. No pneumothorax. Reference is made to recent chest CT which demonstrated small pulmonary nodules. These are not definitely identified by chest x-ray. No acute bony abnormality.  IMPRESSION: 1. Persistent changes of congestive heart failure pulmonary edema. Small pleural effusions. 2. Reference is made to recent chest CT which demonstrated small pulmonary nodules. Follow-up is recommended as on prior CT report.   Electronically Signed   By: Marcello Moores  Register   On: 07/07/2014 07:26    ASSESSMENT AND PLAN 1. Enterococcal bacteremia and aortic valve endocarditis 2. S/p PM system extraction 3. Thrombocytopenia, s/p plt transfusion 4. Surgical aortic valve disease Rec: He is stable from arrhythmia perspective. Await  surgery.   Katriana Dortch,M.D.  Jaevin Medearis,M.D.  07/15/2014 9:09 AM

## 2014-07-16 ENCOUNTER — Encounter (HOSPITAL_COMMUNITY): Payer: Self-pay

## 2014-07-16 ENCOUNTER — Inpatient Hospital Stay (HOSPITAL_COMMUNITY): Payer: Medicare Other

## 2014-07-16 ENCOUNTER — Other Ambulatory Visit: Payer: Self-pay

## 2014-07-16 DIAGNOSIS — Z01810 Encounter for preprocedural cardiovascular examination: Secondary | ICD-10-CM

## 2014-07-16 DIAGNOSIS — I359 Nonrheumatic aortic valve disorder, unspecified: Secondary | ICD-10-CM

## 2014-07-16 DIAGNOSIS — I059 Rheumatic mitral valve disease, unspecified: Secondary | ICD-10-CM

## 2014-07-16 DIAGNOSIS — A419 Sepsis, unspecified organism: Secondary | ICD-10-CM

## 2014-07-16 LAB — CBC
HCT: 32.1 % — ABNORMAL LOW (ref 39.0–52.0)
HEMATOCRIT: 34.6 % — AB (ref 39.0–52.0)
Hemoglobin: 10.3 g/dL — ABNORMAL LOW (ref 13.0–17.0)
Hemoglobin: 10.9 g/dL — ABNORMAL LOW (ref 13.0–17.0)
MCH: 32.9 pg (ref 26.0–34.0)
MCH: 32.7 pg (ref 26.0–34.0)
MCHC: 32.1 g/dL (ref 30.0–36.0)
MCHC: 31.5 g/dL (ref 30.0–36.0)
MCV: 102.6 fL — ABNORMAL HIGH (ref 78.0–100.0)
MCV: 103.9 fL — AB (ref 78.0–100.0)
PLATELETS: 39 10*3/uL — AB (ref 150–400)
Platelets: 41 10*3/uL — ABNORMAL LOW (ref 150–400)
RBC: 3.13 MIL/uL — AB (ref 4.22–5.81)
RBC: 3.33 MIL/uL — ABNORMAL LOW (ref 4.22–5.81)
RDW: 23.6 % — AB (ref 11.5–15.5)
RDW: 23.8 % — ABNORMAL HIGH (ref 11.5–15.5)
WBC: 7.4 10*3/uL (ref 4.0–10.5)
WBC: 8.2 10*3/uL (ref 4.0–10.5)

## 2014-07-16 LAB — COMPREHENSIVE METABOLIC PANEL
ALT: 36 U/L (ref 0–53)
AST: 41 U/L — AB (ref 0–37)
Albumin: 2.2 g/dL — ABNORMAL LOW (ref 3.5–5.2)
Alkaline Phosphatase: 59 U/L (ref 39–117)
Anion gap: 12 (ref 5–15)
BUN: 28 mg/dL — ABNORMAL HIGH (ref 6–23)
CO2: 27 meq/L (ref 19–32)
Calcium: 8.2 mg/dL — ABNORMAL LOW (ref 8.4–10.5)
Chloride: 104 mEq/L (ref 96–112)
Creatinine, Ser: 1.4 mg/dL — ABNORMAL HIGH (ref 0.50–1.35)
GFR calc Af Amer: 54 mL/min — ABNORMAL LOW (ref >90)
GFR calc non Af Amer: 46 mL/min — ABNORMAL LOW (ref >90)
Glucose, Bld: 118 mg/dL — ABNORMAL HIGH (ref 70–99)
Potassium: 3.9 mEq/L (ref 3.7–5.3)
SODIUM: 143 meq/L (ref 137–147)
Total Bilirubin: 0.8 mg/dL (ref 0.3–1.2)
Total Protein: 5.8 g/dL — ABNORMAL LOW (ref 6.0–8.3)

## 2014-07-16 LAB — BASIC METABOLIC PANEL
Anion gap: 15 (ref 5–15)
BUN: 29 mg/dL — AB (ref 6–23)
CO2: 25 mEq/L (ref 19–32)
CREATININE: 1.54 mg/dL — AB (ref 0.50–1.35)
Calcium: 8.1 mg/dL — ABNORMAL LOW (ref 8.4–10.5)
Chloride: 99 mEq/L (ref 96–112)
GFR calc Af Amer: 48 mL/min — ABNORMAL LOW (ref >90)
GFR, EST NON AFRICAN AMERICAN: 41 mL/min — AB (ref >90)
Glucose, Bld: 157 mg/dL — ABNORMAL HIGH (ref 70–99)
Potassium: 4.3 mEq/L (ref 3.7–5.3)
Sodium: 139 mEq/L (ref 137–147)

## 2014-07-16 LAB — PROTIME-INR
INR: 1.21 (ref 0.00–1.49)
Prothrombin Time: 15.3 seconds — ABNORMAL HIGH (ref 11.6–15.2)

## 2014-07-16 LAB — PLATELET INHIBITION P2Y12: Platelet Function  P2Y12: 190 [PRU] — ABNORMAL LOW (ref 194–418)

## 2014-07-16 LAB — GLUCOSE, CAPILLARY
GLUCOSE-CAPILLARY: 120 mg/dL — AB (ref 70–99)
Glucose-Capillary: 100 mg/dL — ABNORMAL HIGH (ref 70–99)
Glucose-Capillary: 187 mg/dL — ABNORMAL HIGH (ref 70–99)
Glucose-Capillary: 168 mg/dL — ABNORMAL HIGH (ref 70–99)

## 2014-07-16 LAB — LACTATE DEHYDROGENASE: LDH: 534 U/L — AB (ref 94–250)

## 2014-07-16 MED ORDER — ENSURE COMPLETE PO LIQD
237.0000 mL | Freq: Three times a day (TID) | ORAL | Status: DC
  Administered 2014-07-16 – 2014-07-23 (×19): 237 mL via ORAL

## 2014-07-16 MED ORDER — PREDNISONE 20 MG PO TABS
40.0000 mg | ORAL_TABLET | Freq: Every day | ORAL | Status: DC
  Administered 2014-07-16 – 2014-07-19 (×4): 40 mg via ORAL
  Filled 2014-07-16 (×6): qty 2

## 2014-07-16 MED ORDER — FUROSEMIDE 80 MG PO TABS
80.0000 mg | ORAL_TABLET | Freq: Every day | ORAL | Status: DC
  Administered 2014-07-18 – 2014-07-20 (×3): 80 mg via ORAL
  Filled 2014-07-16 (×3): qty 1

## 2014-07-16 MED ORDER — SODIUM CHLORIDE 0.9 % IJ SOLN
10.0000 mL | Freq: Two times a day (BID) | INTRAMUSCULAR | Status: DC
  Administered 2014-07-16 – 2014-07-20 (×6): 10 mL
  Administered 2014-07-20: 20 mL
  Administered 2014-07-21: 10 mL
  Administered 2014-07-21 – 2014-07-23 (×4): 20 mL
  Administered 2014-07-23: 10 mL

## 2014-07-16 MED ORDER — SODIUM CHLORIDE 0.9 % IJ SOLN: 3.0000 mL | INTRAMUSCULAR | Status: DC | PRN

## 2014-07-16 MED ORDER — SODIUM CHLORIDE 0.9 % IJ SOLN: 10.0000 mL | INTRAMUSCULAR | Status: DC | PRN

## 2014-07-16 MED ORDER — SODIUM CHLORIDE 0.9 % IV SOLN: 250.0000 mL | INTRAVENOUS | Status: DC | PRN

## 2014-07-16 MED ORDER — SODIUM CHLORIDE 0.9 % IJ SOLN
10.0000 mL | Freq: Two times a day (BID) | INTRAMUSCULAR | Status: DC
  Administered 2014-07-16 – 2014-07-17 (×2): 10 mL

## 2014-07-16 MED ORDER — SODIUM CHLORIDE 0.9 % IJ SOLN: 3.0000 mL | Freq: Two times a day (BID) | INTRAMUSCULAR | Status: DC

## 2014-07-16 MED ORDER — SODIUM CHLORIDE 0.9 % IV SOLN: Freq: Once | INTRAVENOUS | Status: DC

## 2014-07-16 NOTE — Progress Notes (Signed)
Dr Ree Kida aware that 2 lumen PICC is in.

## 2014-07-16 NOTE — Progress Notes (Signed)
Physical Therapy Treatment Patient Details Name: Justin [name] MRN: [id number] DOB: 14-Oct-1933 Today's Date: 07/08/2014    History of Present Illness Pt is a 80 y.o. male with history of CAD status post stents, hypertension, dyslipidemia, a-fib, pacemaker, ITP currently being treated with IVIG, who presents to the emergency department with complaints of fever and shortness of breath that started 8/2. In ED found to have leukocytosis, hypotension, and lactic acidosis. Transferred to MICU for suspected sepsis.     PT Comments    Pt progressing towards physical therapy goals. Cardiac rehab had just worked with pt prior to PT arriving, and pt was agreeable to therapeutic exercise despite his fatigue. Therapist reviewed HEP and again discussed frequency and proper technique/reps for each exercise.   Follow Up Recommendations  Home health PT;Supervision for mobility/OOB     Equipment Recommendations  Rolling walker with 5" wheels    Recommendations for Other Services       Precautions / Restrictions Precautions Precautions: Fall Restrictions Weight Bearing Restrictions: No    Mobility  Bed Mobility               General bed mobility comments: Pt received sitting in the recliner  Transfers                    Ambulation/Gait                 Stairs            Wheelchair Mobility    Modified Rankin (Stroke Patients Only)       Balance                                    Cognition Arousal/Alertness: Awake/alert Behavior During Therapy: WFL for tasks assessed/performed Overall Cognitive Status: Within Functional Limits for tasks assessed                      Exercises General Exercises - Lower Extremity Ankle Circles/Pumps: 15 reps Quad Sets: 15 reps Gluteal Sets: 15 reps Long Arc Quad: 15 reps Hip ABduction/ADduction: 15 reps Straight Leg Raises: 10 reps    General Comments        Pertinent Vitals/Pain Pain  Assessment: No/denies pain    Home Living                      Prior Function            PT Goals (current goals can now be found in the care plan section) Acute Rehab PT Goals Patient Stated Goal: To return home with wife PT Goal Formulation: With patient/family Time For Goal Achievement: July 31, 2014 Potential to Achieve Goals: Good Progress towards PT goals: Progressing toward goals    Frequency  Min 3X/week    PT Plan Current plan remains appropriate    Co-evaluation             End of Session Equipment Utilized During Treatment: Gait belt Activity Tolerance: Patient limited by fatigue Patient left: with call bell/phone within reach;in chair     Time: 7846-9629 PT Time Calculation (min): 20 min  Charges:  $Therapeutic Exercise: 8-22 mins                    G Codes:      Jolyn Lent Jul 31, 2014, 1:49 PM  Jolyn Lent, PT, DPT Acute Rehabilitation Services Pager: [phone]

## 2014-07-16 NOTE — Progress Notes (Signed)
2 Days Post-Op Procedure(s) (LRB): LEAD EXTRACTION (N/A) Subjective:  Endocarditis of aortic valve-blood cultures now clear of enterococcus on ampicillin and gentamicin Permanent pacemaker removed yesterday without significant bleeding, no evidence of infection in pacemaker pocket Dental x-ray showed no evidence of apical lucency or root infection Patient now in atrial fibrillation Platelet count remained stable  40k after 1 unit of platelets yesterday Coronary angiogram scheduled for tomorrow-history of previous coronary stent 1999  Prednisone dose being tapered by hematology for thrombocytopenia  Patient had a transient episode of blurred vision in his right eye this morning which cleared within 10 minutes Objective: Vital signs in last 24 hours: Temp:  [98.3 F (36.8 C)-98.5 F (36.9 C)] 98.3 F (36.8 C) (08/13 0806) Pulse Rate:  [67-72] 68 (08/13 0344) Cardiac Rhythm:  [-] Atrial fibrillation (08/13 0800) Resp:  [19-22] 22 (08/13 0344) BP: (133-162)/(42-51) 139/45 mmHg (08/13 0800) SpO2:  [92 %-99 %] 93 % (08/13 0806) Weight:  [190 lb 0.6 oz (86.2 kg)] 190 lb 0.6 oz (86.2 kg) (08/13 0344)  Hemodynamic parameters for last 24 hours:  Afebrile, stable atrial fibrillation  Intake/Output from previous day: 08/12 0701 - 08/13 0700 In: 560 [P.O.:360; IV Piggyback:200] Out: 2850 [Urine:2850] Intake/Output this shift: Total I/O In: -  Out: 200 [Urine:200]    Lab Results:  Recent Labs  07/15/14 0317 07/16/14 0357  WBC 8.6 7.4  HGB 10.1* 10.3*  HCT 31.8* 32.1*  PLT 41* 39*   BMET:  Recent Labs  07/15/14 0317 07/16/14 0357  NA 141 143  K 4.0 3.9  CL 103 104  CO2 24 27  GLUCOSE 106* 118*  BUN 31* 28*  CREATININE 1.41* 1.40*  CALCIUM 8.3* 8.2*    PT/INR: No results found for this basename: LABPROT, INR,  in the last 72 hours ABG No results found for this basename: phart, pco2, po2, hco3, tco2, acidbasedef, o2sat   CBG (last 3)   Recent Labs  07/15/14 1705  07/15/14 2114 07/16/14 0824  GLUCAP 148* 193* 100*    Assessment/Plan: S/P Procedure(s) (LRB): LEAD EXTRACTION (N/A) CHF from moderate to severe MR and AI, endocarditis, atrial fibrillation Patient continues to have intermittent TIA like events We'll follow results of coronary angiogram tomorrow Patient scheduled for aortic valve replacement with mitral repair-replacement and possible maze next Wednesday by Dr. Roxy Manns Plan discussed with patient and he understands and agrees  [facility] follow platelet count and discuss treatment of thrombocytopenia with hematology - he may benefit from another dose of IVIG I have requested Dr. Enrique Sack to examine the patient's dental status preop   LOS: 10 days    VAN TRIGT III,Marine Lezotte 07/16/2014

## 2014-07-16 NOTE — Consult Note (Signed)
DENTAL CONSULTATION  Date of Consultation:  07/16/2014 Patient Name:   Justin [name] Date of Birth:   August 08, 1933 Medical Record Number: [id number]  VITALS: BP 125/37  Pulse 68  Temp(Src) 98.1 F (36.7 C) (Oral)  Resp 22  Ht 5\' 11"  (1.803 m)  Wt 190 lb 0.6 oz (86.2 kg)  BMI 26.52 kg/m2  SpO2 95%   CHIEF COMPLAINT: The patient was referred by Dr. Tharon Aquas Trigt for dental consultation.  HPI: Justin [name] is a 80 year old male recently diagnosed with bacterial endocarditis. Patient with known aortic vegetation and mitral valve regurgitation. Patient with anticipated aortic valve replacement with a mitral valve repair or replacement as indicated. Patient is now seen as part of a medically necessary pre-heart valve surgery dental protocol evaluation to rule out dental infection that may affect the patient's systemic health and anticipated heart valve surgery.  The patient currently denies acute toothache, swellings, or abscesses. Patient was last seen by a dentist for an exam and cleaning. The patient sees Dr. Ulice Brilliant in [place], [place].  Patient is usually seen for periodontal care on an every 9 months basis. The patient denies having any unmet dental needs at this time.    PROBLEM LIST: Patient Active Problem List   Diagnosis Date Noted  . TIA (transient ischemic attack) 07/10/2014  . Enterococcal bacteremia 07/08/2014  . Sepsis 07/20/2014  . Iron deficiency anemia, unspecified 07/02/2014  . Malabsorption of iron 07/02/2014  . Hypotestosteronemia 07/02/2014  . ITP (idiopathic thrombocytopenic purpura) 07/02/2014  . Diastolic congestive heart failure 06/03/2014  . CHF (congestive heart failure) 05/24/2014  . Acute bronchitis 05/18/2014  . SOB (shortness of breath) 05/18/2014  . Hearing loss 12/21/2013  . Hyperglycemia 12/21/2013  . Mitral regurgitation 08/13/2013  . Chronotropic incompetence with sinus node dysfunction 07/22/2013  . Pacemaker-Medtronic 07/22/2013  .  Hypernatremia 06/03/2013  . Thrombocytopenia 06/03/2013  . Obstructive sleep apnea 06/03/2013  . BPH (benign prostatic hyperplasia) 06/03/2013  . Hyperlipidemia   . Anemia   . Atrial fibrillation   . AV block, Mobitz II   . Near syncope 03/05/2013  . Cerebrovascular disease 08/13/2012  . CAD (coronary artery disease)   . HTN (hypertension)     PMH: Past Medical History  Diagnosis Date  . CAD (coronary artery disease) prior stenting 1999   . Dyslipidemia   . HTN (hypertension)   . WPW (Wolff-Parkinson-White syndrome) loss of preexcitation   . Atrial fibrillation   . AV block, Mobitz II   . Presyncope   . Myocardial infarction   . Pacemaker 04/07/2013  . Arthritis   . History of chicken pox   . Anemia   . Hypernatremia 06/03/2013  . Thrombocytopenia, unspecified 06/03/2013  . Leukopenia 06/03/2013  . Obstructive sleep apnea 06/03/2013    USES CPAP  . Urinary incontinence 06/03/2013  . Hyperglycemia 12/21/2013  . Pancytopenia 06/03/2013  . Iron deficiency anemia, unspecified 07/02/2014  . Malabsorption of iron 07/02/2014  . Hypotestosteronemia 07/02/2014  . ITP (idiopathic thrombocytopenic purpura) 07/02/2014    PSH: Past Surgical History  Procedure Laterality Date  . Coronary angioplasty with stent placement    . Vastectomy    . Insert / replace / remove pacemaker  04/07/2013  . Tee without cardioversion N/A 07/13/2014    Procedure: TRANSESOPHAGEAL ECHOCARDIOGRAM (TEE);  Surgeon: Lelon Perla, MD;  Location: [facility] ENDOSCOPY;  Service: Cardiovascular;  Laterality: N/A;    ALLERGIES: No Known Allergies  MEDICATIONS: Current Facility-Administered Medications  Medication Dose Route Frequency Provider  Last Rate Last Dose  . 0.9 %  sodium chloride infusion  250 mL Intravenous PRN Corey Harold, NP      . 0.9 %  sodium chloride infusion   Intravenous Once [employer], DO      . acetaminophen (TYLENOL) tablet 325-650 mg  325-650 mg Oral Q4H PRN Evans Lance, MD      .  amLODipine (NORVASC) tablet 5 mg  5 mg Oral Daily Lelon Perla, MD   5 mg at 07/16/14 1001  . ampicillin (OMNIPEN) 2 g in sodium chloride 0.9 % 50 mL IVPB  2 g Intravenous 4 times per day Truman Hayward, MD   2 g at 07/16/14 1204  . atorvastatin (LIPITOR) tablet 10 mg  10 mg Oral QPM Belkys A Regalado, MD   10 mg at 07/15/14 1819  . feeding supplement (ENSURE COMPLETE) (ENSURE COMPLETE) liquid 237 mL  237 mL Oral TID WC Ivin Poot, MD      . Derrill Memo ON 07/18/2014] furosemide (LASIX) tablet 80 mg  80 mg Oral Daily Sueanne Margarita, MD      . gentamicin (GARAMYCIN) IVPB 100 mg  100 mg Intravenous Q24H Cassie Jodean Lima, RPH   100 mg at 07/16/14 1210  . insulin aspart (novoLOG) injection 0-9 Units  0-9 Units Subcutaneous TID WC Belkys A Regalado, MD   1 Units at 07/15/14 1819  . ondansetron (ZOFRAN) injection 4 mg  4 mg Intravenous Q6H PRN Evans Lance, MD      . pantoprazole (PROTONIX) EC tablet 40 mg  40 mg Oral Daily Corey Harold, NP   40 mg at 07/16/14 1002  . polyvinyl alcohol (LIQUIFILM TEARS) 1.4 % ophthalmic solution 1 drop  1 drop Both Eyes PRN Vishal Mungal, MD      . predniSONE (DELTASONE) tablet 40 mg  40 mg Oral Q breakfast Volanda Napoleon, MD   40 mg at 07/16/14 1212  . pyridOXINE (VITAMIN B-6) tablet 100 mg  100 mg Oral QPM Corey Harold, NP   100 mg at 07/15/14 1819  . tamsulosin (FLOMAX) capsule 0.4 mg  0.4 mg Oral QPC supper Vishal Mungal, MD   0.4 mg at 07/15/14 1819  . vitamin E capsule 400 Units  400 Units Oral Daily Marijean Heath, NP   400 Units at 07/16/14 1002    LABS: Lab Results  Component Value Date   WBC 7.4 07/16/2014   HGB 10.3* 07/16/2014   HCT 32.1* 07/16/2014   MCV 102.6* 07/16/2014   PLT 39* 07/16/2014      Component Value Date/Time   NA 143 07/16/2014 0357   NA 134 06/24/2014 1355   K 3.9 07/16/2014 0357   K 3.4 06/24/2014 1355   CL 104 07/16/2014 0357   CL 98 06/24/2014 1355   CO2 27 07/16/2014 0357   CO2 30 06/24/2014 1355   GLUCOSE 118*  07/16/2014 0357   GLUCOSE 124* 06/24/2014 1355   BUN 28* 07/16/2014 0357   BUN 34* 06/24/2014 1355   CREATININE 1.40* 07/16/2014 0357   CREATININE 1.5* 06/24/2014 1355   CALCIUM 8.2* 07/16/2014 0357   CALCIUM 8.8 06/24/2014 1355   GFRNONAA 46* 07/16/2014 0357   GFRNONAA 50* 06/24/2014 1320   GFRAA 54* 07/16/2014 0357   GFRAA 57* 06/24/2014 1320   Lab Results  Component Value Date   INR 1.48 07/26/2014   No results found for this basename: PTT    SOCIAL HISTORY: History  Social History  . Marital Status: Married    Spouse Name: N/A    Number of Children: 3  . Years of Education: N/A   Occupational History  .      Retired   Social History Main Topics  . Smoking status: Former Smoker -- 1.00 packs/day for 36 years    Types: Cigarettes    Start date: 01/25/1955    Quit date: 12/05/1991  . Smokeless tobacco: Never Used     Comment: quit smoking 22 yeras ago  . Alcohol Use: Yes     Comment: Occasional  . Drug Use: No  . Sexual Activity: Not on file   Other Topics Concern  . Not on file   Social History Narrative  . No narrative on file    FAMILY HISTORY: Family History  Problem Relation Age of Onset  . Stroke Mother   . Hypertension Mother   . Stroke Sister   . Arthritis Sister     rheumatoid  . Heart attack Sister   . Anemia Sister   . Other Brother     tube put in aorta  . Anemia Brother   . Other Son     cortisone deficiency  . Arthritis Son   . Stroke Brother   . Alcohol abuse Brother   . Barrett's esophagus Son   . Colon cancer Maternal Grandmother   . Colon cancer Maternal Grandfather      REVIEW OF SYSTEMS: Reviewed from chart for this admission.  DENTAL HISTORY: CHIEF COMPLAINT: The patient was referred by Dr. Tharon Aquas Trigt for dental consultation.  HPI: Justin [name] is a 80 year old male recently diagnosed with bacterial endocarditis. Patient with known aortic vegetation and mitral valve regurgitation. Patient with anticipated aortic valve  replacement with a mitral valve repair or replacement as indicated. Patient is now seen as part of a medically necessary pre-heart valve surgery dental protocol evaluation to rule out dental infection that may affect the patient's systemic health and anticipated heart valve surgery.  The patient currently denies acute toothache, swellings, or abscesses. Patient was last seen by a dentist for an exam and cleaning. The patient sees Dr. Ulice Brilliant in [place], [place].  Patient is usually seen for periodontal care on an every 9 months basis. The patient denies having any unmet dental needs at this time.   DENTAL EXAMINATION: GENERAL: The patient is a well-developed, well-nourished male in no acute distress. HEAD AND NECK: There is no palpable submandibular lymphadenopathy. Patient denies acute TMJ symptoms. INTRAORAL EXAM: Patient has normal saliva. There is no evidence of oral abscess formation. DENTITION: The patient is missing tooth numbers 1, 12, 13, 16, 28, 30. PERIODONTAL: Patient has chronic periodontitis with plaque accumulations, gingival recession, and no significant tooth mobility. DENTAL CARIES/SUBOPTIMAL RESTORATIONS: There are no obvious gross dental caries noted. I would need a full series of dental radiographs to rule out other incipient dental caries ENDODONTIC: Patient currently denies acute pulpitis symptoms. I do not see any evidence of periapical pathology or radiolucency. CROWN AND BRIDGE: There are multiple crown and bridge restorations that appear to be acceptable. PROSTHODONTIC: Patient denies having any partial dentures. OCCLUSION: The patient has a poor occlusal scheme but a stable occlusion at this time.  RADIOGRAPHIC INTERPRETATION: An orthopantogram was taken on 07/15/2014. There are missing tooth numbers 1, 12, 13, 16, 28, 30. There is incipient to moderate bone loss. There are no obvious periapical radiolucencies.  There are multiple amalgam, resin, and crown and  bridge restorations that appear to be acceptable.  There is supra-eruption and drifting of the unopposed teeth into the edentulous areas. The maxillary sinuses are noted and without opacities.  A normal trabecular bone pattern is noted. No obvious dental caries are noted.   ASSESSMENTS: 1. Bacterial endocarditis with current IV antibiotic therapy 2. Aortic vegetation with need for aortic valve replacement 3. Moderate mitral regurgitation with anticipated mitral valve repair or replacement 4. Thrombocytopenia with the risk for bleeding with invasive dental procedures 5. Chronic periodontitis of bone loss 6. Gingival recession 7. Accretions-minimal  8. No significant tooth mobility 9. Multiple missing teeth 10. Supra-eruption and drifting of the unopposed teeth into the edentulous areas 11. Poor occlusal scheme but stable occlusion 12. Need for antibiotic premedication prior to invasive dental procedures due to history of bacterial endocarditis and anticipated heart valve surgery 13. Significant medical comorbidities   PLAN/RECOMMENDATIONS: 1. I discussed the risks, benefits, and complications of various treatment options with the patient in relationship to his medical and dental conditions, anticipated aortic valve replacement/mitral valve replacement or repair, and current bacterial endocarditis and thrombocytopenia. We discussed various treatment options to include no treatment,  periodontal therapy, and maintenance periodontal therapy in the future.  The patient currently wishes to defer any dental treatment at this time and will followup with his primary dentist for routine dental care once medically stable from the anticipated heart valve surgery. Patient is aware that he will need antibiotic premedication prior to invasive dental procedures American Heart Association guidelines. Ideally, the patient should be seen at least twice a year for periodontal maintenance and optimum periodontal  health.   2. Discussion of findings with medical team and coordination of future medical and dental care as needed.     The patient is currently cleared for heart of surgery as indicated.   Lenn Cal, DDS

## 2014-07-16 NOTE — Progress Notes (Signed)
*  PRELIMINARY RESULTS* Vascular Ultrasound Lower extremity venous duplex has been completed.  Preliminary findings: No evidence of DVT  Landry Mellow, RDMS, RVT  07/16/2014, 9:45 AM

## 2014-07-16 NOTE — Progress Notes (Signed)
[facility]                                                                              Patient Demographics  Justin [name], is a 80 y.o. male, DOB - January 21, 1933, DGU:[id number]  Admit date - 07/30/2014   Admitting Physician Rigoberto Noel, MD  Outpatient Primary MD for the patient is Penni Homans, MD  LOS - 10   Chief Complaint  Patient presents with  . Shortness of Breath      HPI on 07/18/2014 80 y.o. male with history of coronary artery disease status post stents, hypertension, dyslipidemia, atrial fibrillation no longer on anticoagulation secondary to recent GI bleed, pacemaker, ITP currently being treated with IVIG by Dr. Marin Olp (most recently 8/3) and prednisone 80mg  daily, presented to the emergency department with complaints of fever and shortness of breath that started 8/2. In ED, patient found to have leukocytosis, hypotension, and lactic acidosis. Given 1L IVF bolus and started on levophed. Transferred to MICU for suspected sepsis.   Interim history Patient was hypotensive and required pressors. She was found to have septic shock with enterococcus bacteremia, infectious disease was consulted. TEE was recommended which showed a large aortic valve vegetation. 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 Patient was noted to have an episode of TIA, and neurology was consulted. Patient's pacemaker was removed on 07/15/2014. Cardiothoracic surgery was also consulted for further evaluation.  Assessment & Plan   Enterococcus Bacteremia with Aortic Valve Vegetation:  -Received IV vancomycin for 4 days. Repeated Blood Culture 8-5 no growth to date. WBC trending down. -CT abdomen no mas.  -S/P TEE which showed Large Aortic Valve Vegetation.  -Will hold on placement of PICC line for now until infection better controlled. Will follow ID recommendation to when to order PICC line, will need to  be double lumen per Dr Marin Olp request.  -Vancomycin was changed to ampicillin 8-8. Gentamycin added on  8-9.  -HIV non reactive, viral hepatitis negative. .  -Cardiothoracic surgery also consulted -Patient will also have heart cath on 08/02/2014  Mental status/Transient Hallucination -Occurred on 07/09/2014, resolved -May have been secondary to medications versus delirium -CT of the head was negative  Transient hand weakness, numbness, likely TIA -Carotid Duplex: Bilateral: 1-39% ICA stenosis. Vertebral artery flow is antegrade. Right subclavian artery demonstrates significant stenosis compared to left subclavian artery. Medical management for subclavian artery stenosis.  -Hemoglobin A1c 5.6 -Continue with Lipitor. Follow LFT -TEE showed Aortic valve vegetation, treatment and plan as above -Neurology has signed off  Possible pneumonia vs TRALI from IVIG  -Bilateral effusions - stable   Diarrhea -Resolved.   Elevated troponin -in setting sepsis.  -ECHO normal Wall motion.  -Cardiology following, patient is high-risk for anticoagulation due to to GI bleed  Enterobacter Urine:  -30,000 colonies.   Chronic diastolic heart failure - on lasix at home  -Continue lasix (80 mg ) on 8-5.  -Dyspnea resolved -Cardiology following, and does not want to increase lasix at this time due to upcoming AV replacement and would like to maintain his renal function -Heart cath 07/26/2014  Severe Sepsis/Septic Shock -Secondary to  Enterococcus bacteremia.   -Septic shock resolved, patient did require use of pressors, Levophed -Continue antibiotics as stated above -Blood pressure currently stable  Acute kidney injury -in setting of infection. Monitor on Lasix. Good urine out put.  -Continue to monitor her creatinine  Thrombocytopenia,  -History of ITP, first IVIG treatment 8/3, on chronic prednisone.  -Platelets stable, 39 -Dr. Marin Olp following and appreciated -Received one dose of Nplate  and received epogen on 8/8  Suspected Adrenal insufficiency ( chronic steroids )  -Continue with prednisone.   History of paroxysmal atrial fibrillation  -Overnight, patient went back into atrial fibrillation.  -Telemetry monitoring currently shows A. Fib -Patient is asymptomatic -Pacemaker was removed, however per cardiology notes, patient will need placement of pacemaker post surgery.  -No anticoagulation at this time due to patient's thrombocytopenia.   Code Status: Full  Family Communication: None at bedside  Disposition Plan: Admitted, pending further increase in platelets, cath on 07/04/2014, and AV replacement.  Time Spent in minutes   25 minutes  Procedures 6/24 TTE >>> LVEF 55%, Grade 2 DD  8/3 CT chest >>> bilateral effusions, bilateral GGO, several nodular opacities  8/4 ECHO >>> Mild to mod MR, EF 50-55%, G2 Dia Dys Fcn  TEE: normal LV function; severe MR; large aortic valve vegetation with moderate AI; no pacemaker vegetation identified 8/11 Pacemaker extraction  Consults   Cardiology Cardiothoracic surgery Neurology Infectious disease Oncology  DVT Prophylaxis  SCDs  Lab Results  Component Value Date   PLT 39* 07/16/2014    Medications  Scheduled Meds: . amLODipine  5 mg Oral Daily  . ampicillin (OMNIPEN) IV  2 g Intravenous 4 times per day  . atorvastatin  10 mg Oral QPM  . feeding supplement (ENSURE COMPLETE)  237 mL Oral TID WC  . [START ON 07/18/2014] furosemide  80 mg Oral Daily  . gentamicin  100 mg Intravenous Q24H  . insulin aspart  0-9 Units Subcutaneous TID WC  . pantoprazole  40 mg Oral Daily  . predniSONE  40 mg Oral Q breakfast  . pyridOXINE  100 mg Oral QPM  . tamsulosin  0.4 mg Oral QPC supper  . vitamin E  400 Units Oral Daily   Continuous Infusions:  PRN Meds:.sodium chloride, acetaminophen, ondansetron (ZOFRAN) IV, polyvinyl alcohol  Antibiotics    Anti-infectives   Start     Dose/Rate Route Frequency Ordered Stop   07/16/14  1300  gentamicin (GARAMYCIN) IVPB 100 mg     100 mg 200 mL/hr over 30 Minutes Intravenous Every 24 hours 07/15/14 1917     07/15/2014 1045  gentamicin (GARAMYCIN) 80 mg in sodium chloride irrigation 0.9 % 500 mL irrigation  Status:  Discontinued     80 mg Irrigation On call 08/02/2014 1031 07/17/2014 1805   07/12/14 1300  gentamicin (GARAMYCIN) IVPB 80 mg  Status:  Discontinued     80 mg 100 mL/hr over 30 Minutes Intravenous Every 24 hours 07/12/14 1200 07/15/14 1917   07/11/14 1200  ampicillin (OMNIPEN) 2 g in sodium chloride 0.9 % 50 mL IVPB     2 g 150 mL/hr over 20 Minutes Intravenous 4 times per day 07/11/14 0945     07/07/14 0030  piperacillin-tazobactam (ZOSYN) IVPB 3.375 g  Status:  Discontinued     3.375 g 12.5 mL/hr over 240 Minutes Intravenous Every 8 hours 07/12/2014 2042 07/08/14 1602   07/11/2014 2300  levofloxacin (LEVAQUIN) IVPB 750 mg  Status:  Discontinued     750 mg 100 mL/hr  over 90 Minutes Intravenous Every 48 hours 07/09/2014 2232 07/08/14 1022   08/02/2014 1615  piperacillin-tazobactam (ZOSYN) IVPB 3.375 g     3.375 g 100 mL/hr over 30 Minutes Intravenous  Once 07/22/2014 1604 07/25/2014 1755   07/22/2014 1615  vancomycin (VANCOCIN) IVPB 1000 mg/200 mL premix     1,000 mg 200 mL/hr over 60 Minutes Intravenous  Once 07/30/2014 1604 08/03/2014 1930   07/04/2014 0030  vancomycin (VANCOCIN) IVPB 750 mg/150 ml premix  Status:  Discontinued     750 mg 150 mL/hr over 60 Minutes Intravenous Every 12 hours 07/13/2014 2042 07/11/14 0945        Subjective:   Vickey Sages seen and examined today.  Patient states he is feeling some heart palpitations however denied any chest pain. He denies any shortness of breath. He was able to walk yesterday using his walker. Patient denied any dizziness, headache, abdominal pain  Objective:   Filed Vitals:   07/15/14 2300 07/16/14 0344 07/16/14 0800 07/16/14 0806  BP: 139/42 133/45 139/45   Pulse: 67 68    Temp: 98.3 F (36.8 C) 98.3 F (36.8 C)  98.3 F  (36.8 C)  TempSrc: Oral Oral  Oral  Resp: 20 22    Height:      Weight:  86.2 kg (190 lb 0.6 oz)    SpO2: 95% 92%  93%    Wt Readings from Last 3 Encounters:  07/16/14 86.2 kg (190 lb 0.6 oz)  07/16/14 86.2 kg (190 lb 0.6 oz)  07/16/14 86.2 kg (190 lb 0.6 oz)     Intake/Output Summary (Last 24 hours) at 07/16/14 1125 Last data filed at 07/16/14 0800  Gross per 24 hour  Intake    440 ml  Output   2950 ml  Net  -2510 ml    Exam  General: Well developed, well nourished, NAD, appears stated age  HEENT: NCAT, mucous membranes moist.   Cardiovascular: S1 S2 auscultated, 1/6 SEM. Irregular  Respiratory: Diminished breath sounds, bilateral crackles noted in the bases.  Abdomen: Soft, nontender, nondistended, + bowel sounds  Extremities: warm dry without cyanosis clubbing. 2+ edema in LE B/L  Neuro: AAOx3, no focal deficits  Psych: Normal affect and demeanor with intact judgement and insight  Data Review   Micro Results Recent Results (from the past 240 hour(s))  CULTURE, BLOOD (ROUTINE X 2)     Status: None   Collection Time    07/09/2014  3:30 PM      Result Value Ref Range Status   Specimen Description BLOOD LEFT ARM   Final   Special Requests BOTTLES DRAWN AEROBIC AND ANAEROBIC [facility]   Final   Culture  Setup Time     Final   Value: 07/29/2014 19:23     Performed at [facility]   Culture     Final   Value: ENTEROCOCCUS SPECIES     Note: SUSCEPTIBILITIES PERFORMED ON PREVIOUS CULTURE WITHIN THE LAST 5 DAYS.     Note: Gram Stain Report Called to,Read Back By and Verified With: JAMES ARTIS 07/07/14 AT 0730 [place]     Performed at [facility]   Report Status 07/09/2014 FINAL   Final  URINE CULTURE     Status: None   Collection Time    07/31/2014  4:18 PM      Result Value Ref Range Status   Specimen Description URINE, CLEAN CATCH   Final   Special Requests NONE   Final   Culture  Setup Time     Final   Value: 07/07/2014 02:46     Performed at [place]     Final   Value: 35,000 COLONIES/ML     Performed at [facility]   Culture     Final   Value: ENTEROBACTER CLOACAE     Performed at [facility]   Report Status 07/09/2014 FINAL   Final   Organism ID, Bacteria ENTEROBACTER CLOACAE   Final  CULTURE, BLOOD (ROUTINE X 2)     Status: None   Collection Time    07/18/2014  4:30 PM      Result Value Ref Range Status   Specimen Description BLOOD R ARM   Final   Special Requests BOTTLES DRAWN AEROBIC AND ANAEROBIC [facility] EACH   Final   Culture  Setup Time     Final   Value: 07/23/2014 19:22     Performed at [facility]   Culture     Final   Value: ENTEROCOCCUS SPECIES     Note: Gram Stain Report Called to,Read Back By and Verified With: JAMES ARTIS 07/07/14 AT 0730 [place]     Performed at [facility]   Report Status 07/09/2014 FINAL   Final   Organism ID, Bacteria ENTEROCOCCUS SPECIES   Final  MRSA PCR SCREENING     Status: None   Collection Time    07/22/2014  7:52 PM      Result Value Ref Range Status   MRSA by PCR NEGATIVE  NEGATIVE Final   Comment:            The GeneXpert MRSA Assay (FDA     approved for NASAL specimens     only), is one component of a     comprehensive MRSA colonization     surveillance program. It is not     intended to diagnose MRSA     infection nor to guide or     monitor treatment for     MRSA infections.  CULTURE, BLOOD (ROUTINE X 2)     Status: None   Collection Time    07/08/14 12:00 PM      Result Value Ref Range Status   Specimen Description BLOOD RIGHT HAND   Final   Special Requests BOTTLES DRAWN AEROBIC ONLY [facility]   Final   Culture  Setup Time     Final   Value: 07/08/2014 17:14     Performed at [facility]   Culture     Final   Value: NO GROWTH 5 DAYS     Performed at [facility]   Report Status 07/11/2014 FINAL   Final  CULTURE, BLOOD (ROUTINE X 2)     Status: None   Collection Time    07/08/14 12:10 PM      Result  Value Ref Range Status   Specimen Description BLOOD LEFT HAND   Final   Special Requests BOTTLES DRAWN AEROBIC ONLY [facility]   Final   Culture  Setup Time     Final   Value: 07/08/2014 17:05     Performed at [facility]   Culture     Final   Value: NO GROWTH 5 DAYS     Performed at [facility]   Report Status 07/05/2014 FINAL   Final  FECAL OCCULT BLOOD, IMMUNOCHEMICAL     Status: None   Collection Time    07/13/14  5:24 PM  Result Value Ref Range Status   Fecal Occult Bld Negative  Negative Final  MRSA PCR SCREENING     Status: None   Collection Time    07/08/2014  6:17 PM      Result Value Ref Range Status   MRSA by PCR NEGATIVE  NEGATIVE Final   Comment:            The GeneXpert MRSA Assay (FDA     approved for NASAL specimens     only), is one component of a     comprehensive MRSA colonization     surveillance program. It is not     intended to diagnose MRSA     infection nor to guide or     monitor treatment for     MRSA infections.    Radiology Reports Ct Abdomen Pelvis Wo Contrast  07/09/2014   CLINICAL DATA:  Enterococcus bacteremia. Evaluate for abdominal source. Evaluate for potential tumor. Enterobacter is also present in the urine.  EXAM: CT ABDOMEN AND PELVIS WITHOUT CONTRAST  TECHNIQUE: Multidetector CT imaging of the abdomen and pelvis was performed following the standard protocol without IV contrast.  COMPARISON:  No priors.  FINDINGS: Lung Bases: Atherosclerotic calcifications in the right coronary artery. Pacemaker lead terminating in the right ventricular apex. Moderate-sized hiatal hernia. Moderate to large bilateral pleural effusions which appear to layer dependently and are associated with extensive passive atelectasis in the visualized portions of the lower lobes of the lungs bilaterally.  Abdomen/Pelvis: 9 mm low attenuation lesion in segment 2 of the liver is incompletely characterized on today's noncontrast CT examination, but is unchanged,  and favored to represent a tiny cyst. No other focal hepatic lesions are identified on today's non contrast CT examination. There is a tiny calcifications in the head of the pancreas (image 31 of series 2) which appears to be immediately posterior to the distal common bile duct, potentially related to prior episodes of pancreatitis. Other small calcifications are also noted in the head and tail of the pancreas as well. The calcification in the head of the pancreas is not favored to be within the common bile duct, as the common bile duct is normal in caliber measuring 4 mm, and there is no intrahepatic biliary ductal dilatation to suggest obstruction. The unenhanced appearance of the gallbladder, spleen and bilateral adrenal glands is unremarkable. There is bilateral perinephric stranding which is nonspecific. 1.5 cm intermediate attenuation lesion extending exophytically off the lateral aspect of the upper pole of the right kidney is incompletely characterized on today's non contrast CT examination, but is statistically likely to represent a mildly proteinaceous cyst.  Normal appendix. Trace volume of ascites. No pneumoperitoneum. No pathologic distention of small bowel. 10 mm short axis inguinal lymph nodes bilaterally are nonspecific. No other definite lymphadenopathy identified in the abdomen or pelvis on today's non contrast CT examination. Mild diffuse mesenteric edema. Prostate gland is enlarged measuring 4.9 x 6.1 cm. Urinary bladder is generally unremarkable in appearance, however, the attenuation values in the urine are rather high ranging from 45-55 HU, suggesting proteinaceous or hemorrhagic contents.  Musculoskeletal: Mild diffuse body wall edema. There are no aggressive appearing lytic or blastic lesions noted in the visualized portions of the skeleton.  IMPRESSION: 1. No definite source for bacteremia identified on today's examination. 2. Relatively high attenuation of the urine is of uncertain  etiology and significance, but may suggest some proteinaceous or hemorrhagic contents. 3. Findings suggestive of anasarca, including moderate to large bilateral pleural  effusions, trace volume of ascites, mild diffuse mesenteric edema and diffuse body wall edema. 4. Prostatomegaly. 5. Additional incidental findings, as above.   Electronically Signed   By: Vinnie Langton M.D.   On: 07/09/2014 12:46   Dg Chest 2 View  07/15/2014   CLINICAL DATA:  Pacemaker extraction  EXAM: CHEST  2 VIEW  COMPARISON:  07/07/2014  FINDINGS: Cardiac pacer has been removed with generator and 2 leads absent. Mild interstitial change in peribronchial cuffing. These findings are less prominent when compared to the prior study. There is consolidation in the medial left lung base which appears most consistent with atelectasis.  IMPRESSION: 1. Mild residual interstitial change may reflect minimal remaining interstitial pulmonary edema 2. Left lower lobe atelectasis   Electronically Signed   By: Skipper Cliche M.D.   On: 07/15/2014 08:15   Dg Chest 2 View  07/31/2014   CLINICAL DATA:  Fever, chills, shortness of breath.  EXAM: CHEST  2 VIEW  COMPARISON:  06/10/2014  FINDINGS: Left pacer in place with leads in the right atrium and right ventricle. Heart is normal size. Mild peribronchial thickening. No confluent airspace opacities or effusions. No acute bony abnormality.  IMPRESSION: Bronchitic changes.   Electronically Signed   By: Rolm Baptise M.D.   On: 07/05/2014 15:07   Ct Head Wo Contrast  07/09/2014   CLINICAL DATA:  Hallucinations.  Thrombocytopenia  EXAM: CT HEAD WITHOUT CONTRAST  TECHNIQUE: Contiguous axial images were obtained from the base of the skull through the vertex without intravenous contrast.  COMPARISON:  None.  FINDINGS: Mild atrophy. Negative for hemorrhage. Negative for acute infarct or mass. Calvarium intact.  IMPRESSION: No acute abnormality.   Electronically Signed   By: Franchot Gallo M.D.   On: 07/09/2014  18:47   Ct Angio Chest W/cm &/or Wo Cm  07/12/2014   CLINICAL DATA:  Shortness of breath and fever  EXAM: CT ANGIOGRAPHY CHEST WITH CONTRAST  TECHNIQUE: Multidetector CT imaging of the chest was performed using the standard protocol during bolus administration of intravenous contrast. Multiplanar CT image reconstructions and MIPs were obtained to evaluate the vascular anatomy.  CONTRAST:  36mL OMNIPAQUE IOHEXOL 350 MG/ML SOLN  COMPARISON:  Chest radiograph July 06, 2014  FINDINGS: There is no demonstrable pulmonary embolus. There is no thoracic aortic aneurysm or dissection.  There are free-flowing pleural effusions bilaterally. There is hazy ground-glass opacity diffusely.  On axial CT slice 55 series 6, there is a 6 x 4 mm nodular opacity in the medial segment of the right middle lobe. On axial slice 64 series 6, there is a 6 x 4 mm nodular opacity in the lateral segment of the left lower lobe. On axial slice 62 series 6, there is a 7 x 5 mm nodular opacity in the lateral segment of the right middle lobe. On axial slice 69 series 6, there is a 5 x 4 mm nodular opacity in the superior segment of the right lower lobe.  There is no appreciable thoracic adenopathy. Pericardium is not thickened. Pacemaker leads are attached to the right atrium and right ventricle. There is a focal hiatal hernia.  Visualized upper abdominal structures appear unremarkable. There are no blastic or lytic bone lesions. There is degenerative change in the thoracic spine. Thyroid appears unremarkable.  Review of the MIP images confirms the above findings.  IMPRESSION: No demonstrable pulmonary embolus.  Bilateral effusions with extensive ground-glass opacity bilaterally. Suspect alveolar edema with findings consistent with congestive heart failure. Atypical infection  or allergic type phenomenon are differential considerations, however.  Several nodular opacities, largest measuring 7 mm. Followup of these nodular opacity should be based on  Fleischner Society guidelines. If the patient is at high risk for bronchogenic carcinoma, follow-up chest CT at 3-54months is recommended. If the patient is at low risk for bronchogenic carcinoma, follow-up chest CT at 6-12 months is recommended. This recommendation follows the consensus statement: Guidelines for Management of Small Pulmonary Nodules Detected on CT Scans: A Statement from the [place] as published in Radiology 2005; 237:395-400.  Focal hiatal hernia.  No adenopathy.   Electronically Signed   By: Lowella Grip M.D.   On: 07/28/2014 18:09   Dg Chest Port 1 View  07/07/2014   CLINICAL DATA:  Evaluate airspace.  EXAM: PORTABLE CHEST - 1 VIEW  COMPARISON:  CT 07/08/2014.  Chest x-ray 08/03/2014 and 06/10/2014.  FINDINGS: Cardiac pacer with lead tips in right atrium and right ventricle. Mediastinum and hilar structures are unremarkable. Cardiomegaly with mild pulmonary vascular prominence and interstitial prominence consistent with congestive heart failure. Small pleural effusions. No pneumothorax. Reference is made to recent chest CT which demonstrated small pulmonary nodules. These are not definitely identified by chest x-ray. No acute bony abnormality.  IMPRESSION: 1. Persistent changes of congestive heart failure pulmonary edema. Small pleural effusions. 2. Reference is made to recent chest CT which demonstrated small pulmonary nodules. Follow-up is recommended as on prior CT report.   Electronically Signed   By: Marcello Moores  Register   On: 07/07/2014 07:26    CBC  Recent Labs Lab 07/12/14 0430 07/13/14 0415 07/08/2014 0537 07/15/14 0317 07/16/14 0357  WBC 12.0* 11.1* 11.4* 8.6 7.4  HGB 9.4* 9.6* 10.4* 10.1* 10.3*  HCT 28.3* 28.9* 32.2* 31.8* 32.1*  PLT 10* 18* 19* 41* 39*  MCV 97.3 98.0 101.6* 101.9* 102.6*  MCH 32.3 32.5 32.8 32.4 32.9  MCHC 33.2 33.2 32.3 31.8 32.1  RDW 19.6* 20.7* 21.6* 23.0* 23.6*    Chemistries   Recent Labs Lab 07/12/14 0430 07/13/14 0415  07/28/2014 0537 07/15/14 0317 07/16/14 0357  NA 141 142 142 141 143  K 3.6* 3.5* 3.7 4.0 3.9  CL 105 104 104 103 104  CO2 25 24 23 24 27   GLUCOSE 109* 109* 103* 106* 118*  BUN 38* 35* 32* 31* 28*  CREATININE 1.56* 1.50* 1.36* 1.41* 1.40*  CALCIUM 8.4 8.1* 8.2* 8.3* 8.2*  AST 69* 71* 56* 50* 41*  ALT 53 60* 54* 47 36  ALKPHOS 56 61 62 64 59  BILITOT 0.8 0.9 0.8 0.7 0.8   ------------------------------------------------------------------------------------------------------------------ estimated creatinine clearance is 45.6 ml/min (by C-G formula based on Cr of 1.4). ------------------------------------------------------------------------------------------------------------------ No results found for this basename: HGBA1C,  in the last 72 hours ------------------------------------------------------------------------------------------------------------------ No results found for this basename: CHOL, HDL, LDLCALC, TRIG, CHOLHDL, LDLDIRECT,  in the last 72 hours ------------------------------------------------------------------------------------------------------------------ No results found for this basename: TSH, T4TOTAL, FREET3, T3FREE, THYROIDAB,  in the last 72 hours ------------------------------------------------------------------------------------------------------------------ No results found for this basename: VITAMINB12, FOLATE, FERRITIN, TIBC, IRON, RETICCTPCT,  in the last 72 hours  Coagulation profile No results found for this basename: INR, PROTIME,  in the last 168 hours  No results found for this basename: DDIMER,  in the last 72 hours  Cardiac Enzymes No results found for this basename: CK, CKMB, TROPONINI, MYOGLOBIN,  in the last 168 hours ------------------------------------------------------------------------------------------------------------------ No components found with this basename: POCBNP,     Nakayla Rorabaugh D.O. on 07/16/2014 at 11:25 AM  Between 7am  to 7pm - Pager - [phone]  After 7pm go to [email or web address] - password [email or web address]  And look for the night coverage person covering for me after hours  [facility] Office  [phone]

## 2014-07-16 NOTE — Progress Notes (Signed)
CARDIAC REHAB PHASE I   PRE:  Rate/Rhythm: 87 afib bathng  BP:  Supine:   Sitting: 138/59  Standing:    SaO2: 99%RA  MODE:  Ambulation: 350 ft   POST:  Rate/Rhythm: 96 afib, to 70 with rest  BP:  Supine:   Sitting: 125/34  Standing:    SaO2: 97%RA 1028-1052 Pt walked 350 ft on RA with rolling walker and minimal asst. Tolerated well. Feeling some stronger today. To recliner after walk. Call bell in reach.    Graylon Good, RN BSN  07/16/2014 10:48 AM

## 2014-07-16 NOTE — Progress Notes (Signed)
Peripherally Inserted Central Catheter/Midline Placement  The IV Nurse has discussed with the patient and/or persons authorized to consent for the patient, the purpose of this procedure and the potential benefits and risks involved with this procedure.  The benefits include less needle sticks, lab draws from the catheter and patient may be discharged home with the catheter.  Risks include, but not limited to, infection, bleeding, blood clot (thrombus formation), and puncture of an artery; nerve damage and irregular heat beat.  Alternatives to this procedure were also discussed.  PICC/Midline Placement Documentation        Justin [name] 07/16/2014, 3:05 PM

## 2014-07-16 NOTE — Progress Notes (Signed)
Mr. Mcintyre is doing pre-well. He have some paroxysmal atrial fibrillation last night. He does have a temporary pacemaker in place.  Is not clear as to when he is going to have cardiac surgery. It sounds like they will want to do a cardiac cath first. This may get set up for tomorrow.  His platelet count is at 39,000. This is holding steady.  One problem that we may have is that he is on quite a bit of steroid. If he is going to have cardiac surgery, we probably need to try to get his steroid dose down to allow for him to have better healing.  If he is to have cardiac cath tomorrow, he have a platelet transfusion prior to this. Again, I don't think he will have a problem with hemostasis.  He's had no problems with fever. He's had no sweats. There's been no nausea or vomiting.  He continues on IV antibiotics.  His renal function is holding prestudy. His BUN is 20 and creatinine 1.4. His hemoglobin also is holding steady at 10.3.  There's been no change in his physical exam. His vital signs are all stable. Blood pressure 133/45.  I will start to taper down the prednisone.  Lum Keas

## 2014-07-16 NOTE — Progress Notes (Signed)
SUBJECTIVE:  No complaints  OBJECTIVE:   Vitals:   Filed Vitals:   07/15/14 2300 07/16/14 0344 07/16/14 0800 07/16/14 0806  BP: 139/42 133/45 139/45   Pulse: 67 68    Temp: 98.3 F (36.8 C) 98.3 F (36.8 C)  98.3 F (36.8 C)  TempSrc: Oral Oral  Oral  Resp: 20 22    Height:      Weight:  190 lb 0.6 oz (86.2 kg)    SpO2: 95% 92%  93%   I&O's:   Intake/Output Summary (Last 24 hours) at 07/16/14 0950 Last data filed at 07/16/14 0800  Gross per 24 hour  Intake    440 ml  Output   2950 ml  Net  -2510 ml   TELEMETRY: Reviewed telemetry pt in atrial fibrillation     PHYSICAL EXAM General: Well developed, well nourished, in no acute distress Head: Eyes PERRLA, No xanthomas.   Normal cephalic and atramatic  Lungs:   Crackles at bases Heart:   Irregularly irregular S1 S2 Pulses are 2+ & equal. Abdomen: Bowel sounds are positive, abdomen soft and non-tender without masses  Extremities:   2+ pitting edema Neuro: Alert and oriented X 3. Psych:  Good affect, responds appropriately   LABS: Basic Metabolic Panel:  Recent Labs  07/15/14 0317 07/16/14 0357  NA 141 143  K 4.0 3.9  CL 103 104  CO2 24 27  GLUCOSE 106* 118*  BUN 31* 28*  CREATININE 1.41* 1.40*  CALCIUM 8.3* 8.2*   Liver Function Tests:  Recent Labs  07/15/14 0317 07/16/14 0357  AST 50* 41*  ALT 47 36  ALKPHOS 64 59  BILITOT 0.7 0.8  PROT 6.0 5.8*  ALBUMIN 2.4* 2.2*   No results found for this basename: LIPASE, AMYLASE,  in the last 72 hours CBC:  Recent Labs  07/15/14 0317 07/16/14 0357  WBC 8.6 7.4  HGB 10.1* 10.3*  HCT 31.8* 32.1*  MCV 101.9* 102.6*  PLT 41* 39*   Cardiac Enzymes: No results found for this basename: CKTOTAL, CKMB, CKMBINDEX, TROPONINI,  in the last 72 hours BNP: No components found with this basename: POCBNP,  D-Dimer: No results found for this basename: DDIMER,  in the last 72 hours Hemoglobin A1C: No results found for this basename: HGBA1C,  in the last 72  hours Fasting Lipid Panel: No results found for this basename: CHOL, HDL, LDLCALC, TRIG, CHOLHDL, LDLDIRECT,  in the last 72 hours Thyroid Function Tests: No results found for this basename: TSH, T4TOTAL, FREET3, T3FREE, THYROIDAB,  in the last 72 hours Anemia Panel: No results found for this basename: VITAMINB12, FOLATE, FERRITIN, TIBC, IRON, RETICCTPCT,  in the last 72 hours Coag Panel:   Lab Results  Component Value Date   INR 1.48 07/19/2014    RADIOLOGY: Ct Abdomen Pelvis Wo Contrast  07/09/2014   CLINICAL DATA:  Enterococcus bacteremia. Evaluate for abdominal source. Evaluate for potential tumor. Enterobacter is also present in the urine.  EXAM: CT ABDOMEN AND PELVIS WITHOUT CONTRAST  TECHNIQUE: Multidetector CT imaging of the abdomen and pelvis was performed following the standard protocol without IV contrast.  COMPARISON:  No priors.  FINDINGS: Lung Bases: Atherosclerotic calcifications in the right coronary artery. Pacemaker lead terminating in the right ventricular apex. Moderate-sized hiatal hernia. Moderate to large bilateral pleural effusions which appear to layer dependently and are associated with extensive passive atelectasis in the visualized portions of the lower lobes of the lungs bilaterally.  Abdomen/Pelvis: 9 mm low attenuation lesion in segment  2 of the liver is incompletely characterized on today's noncontrast CT examination, but is unchanged, and favored to represent a tiny cyst. No other focal hepatic lesions are identified on today's non contrast CT examination. There is a tiny calcifications in the head of the pancreas (image 31 of series 2) which appears to be immediately posterior to the distal common bile duct, potentially related to prior episodes of pancreatitis. Other small calcifications are also noted in the head and tail of the pancreas as well. The calcification in the head of the pancreas is not favored to be within the common bile duct, as the common bile duct is  normal in caliber measuring 4 mm, and there is no intrahepatic biliary ductal dilatation to suggest obstruction. The unenhanced appearance of the gallbladder, spleen and bilateral adrenal glands is unremarkable. There is bilateral perinephric stranding which is nonspecific. 1.5 cm intermediate attenuation lesion extending exophytically off the lateral aspect of the upper pole of the right kidney is incompletely characterized on today's non contrast CT examination, but is statistically likely to represent a mildly proteinaceous cyst.  Normal appendix. Trace volume of ascites. No pneumoperitoneum. No pathologic distention of small bowel. 10 mm short axis inguinal lymph nodes bilaterally are nonspecific. No other definite lymphadenopathy identified in the abdomen or pelvis on today's non contrast CT examination. Mild diffuse mesenteric edema. Prostate gland is enlarged measuring 4.9 x 6.1 cm. Urinary bladder is generally unremarkable in appearance, however, the attenuation values in the urine are rather high ranging from 45-55 HU, suggesting proteinaceous or hemorrhagic contents.  Musculoskeletal: Mild diffuse body wall edema. There are no aggressive appearing lytic or blastic lesions noted in the visualized portions of the skeleton.  IMPRESSION: 1. No definite source for bacteremia identified on today's examination. 2. Relatively high attenuation of the urine is of uncertain etiology and significance, but may suggest some proteinaceous or hemorrhagic contents. 3. Findings suggestive of anasarca, including moderate to large bilateral pleural effusions, trace volume of ascites, mild diffuse mesenteric edema and diffuse body wall edema. 4. Prostatomegaly. 5. Additional incidental findings, as above.   Electronically Signed   By: Vinnie Langton M.D.   On: 07/09/2014 12:46   Dg Orthopantogram  07/15/2014   CLINICAL DATA:  Endocarditis.  EXAM: ORTHOPANTOGRAM/PANORAMIC  COMPARISON:  None.  FINDINGS: There are multiple  missing teeth and numerous metallic restorations. However, there is no evidence of a periapical abscess or osteomyelitis or other acute abnormality.  IMPRESSION: No periapical abscesses or other acute abnormalities.   Electronically Signed   By: Rozetta Nunnery M.D.   On: 07/15/2014 21:43   Dg Chest 2 View  07/15/2014   CLINICAL DATA:  Pacemaker extraction  EXAM: CHEST  2 VIEW  COMPARISON:  07/07/2014  FINDINGS: Cardiac pacer has been removed with generator and 2 leads absent. Mild interstitial change in peribronchial cuffing. These findings are less prominent when compared to the prior study. There is consolidation in the medial left lung base which appears most consistent with atelectasis.  IMPRESSION: 1. Mild residual interstitial change may reflect minimal remaining interstitial pulmonary edema 2. Left lower lobe atelectasis   Electronically Signed   By: Skipper Cliche M.D.   On: 07/15/2014 08:15   Dg Chest 2 View  07/26/2014   CLINICAL DATA:  Fever, chills, shortness of breath.  EXAM: CHEST  2 VIEW  COMPARISON:  06/10/2014  FINDINGS: Left pacer in place with leads in the right atrium and right ventricle. Heart is normal size. Mild peribronchial thickening. No  confluent airspace opacities or effusions. No acute bony abnormality.  IMPRESSION: Bronchitic changes.   Electronically Signed   By: Rolm Baptise M.D.   On: 07/20/2014 15:07   Ct Head Wo Contrast  07/09/2014   CLINICAL DATA:  Hallucinations.  Thrombocytopenia  EXAM: CT HEAD WITHOUT CONTRAST  TECHNIQUE: Contiguous axial images were obtained from the base of the skull through the vertex without intravenous contrast.  COMPARISON:  None.  FINDINGS: Mild atrophy. Negative for hemorrhage. Negative for acute infarct or mass. Calvarium intact.  IMPRESSION: No acute abnormality.   Electronically Signed   By: Franchot Gallo M.D.   On: 07/09/2014 18:47   Ct Angio Chest W/cm &/or Wo Cm  07/12/2014   CLINICAL DATA:  Shortness of breath and fever  EXAM: CT  ANGIOGRAPHY CHEST WITH CONTRAST  TECHNIQUE: Multidetector CT imaging of the chest was performed using the standard protocol during bolus administration of intravenous contrast. Multiplanar CT image reconstructions and MIPs were obtained to evaluate the vascular anatomy.  CONTRAST:  36mL OMNIPAQUE IOHEXOL 350 MG/ML SOLN  COMPARISON:  Chest radiograph July 06, 2014  FINDINGS: There is no demonstrable pulmonary embolus. There is no thoracic aortic aneurysm or dissection.  There are free-flowing pleural effusions bilaterally. There is hazy ground-glass opacity diffusely.  On axial CT slice 55 series 6, there is a 6 x 4 mm nodular opacity in the medial segment of the right middle lobe. On axial slice 64 series 6, there is a 6 x 4 mm nodular opacity in the lateral segment of the left lower lobe. On axial slice 62 series 6, there is a 7 x 5 mm nodular opacity in the lateral segment of the right middle lobe. On axial slice 69 series 6, there is a 5 x 4 mm nodular opacity in the superior segment of the right lower lobe.  There is no appreciable thoracic adenopathy. Pericardium is not thickened. Pacemaker leads are attached to the right atrium and right ventricle. There is a focal hiatal hernia.  Visualized upper abdominal structures appear unremarkable. There are no blastic or lytic bone lesions. There is degenerative change in the thoracic spine. Thyroid appears unremarkable.  Review of the MIP images confirms the above findings.  IMPRESSION: No demonstrable pulmonary embolus.  Bilateral effusions with extensive ground-glass opacity bilaterally. Suspect alveolar edema with findings consistent with congestive heart failure. Atypical infection or allergic type phenomenon are differential considerations, however.  Several nodular opacities, largest measuring 7 mm. Followup of these nodular opacity should be based on Fleischner Society guidelines. If the patient is at high risk for bronchogenic carcinoma, follow-up chest CT at  3-9months is recommended. If the patient is at low risk for bronchogenic carcinoma, follow-up chest CT at 6-12 months is recommended. This recommendation follows the consensus statement: Guidelines for Management of Small Pulmonary Nodules Detected on CT Scans: A Statement from the [employer] as published in Radiology 2005; 237:395-400.  Focal hiatal hernia.  No adenopathy.   Electronically Signed   By: Lowella Grip M.D.   On: 07/05/2014 18:09   Dg Chest Port 1 View  07/07/2014   CLINICAL DATA:  Evaluate airspace.  EXAM: PORTABLE CHEST - 1 VIEW  COMPARISON:  CT 07/22/2014.  Chest x-ray 07/28/2014 and 06/10/2014.  FINDINGS: Cardiac pacer with lead tips in right atrium and right ventricle. Mediastinum and hilar structures are unremarkable. Cardiomegaly with mild pulmonary vascular prominence and interstitial prominence consistent with congestive heart failure. Small pleural effusions. No pneumothorax. Reference is made to recent chest  CT which demonstrated small pulmonary nodules. These are not definitely identified by chest x-ray. No acute bony abnormality.  IMPRESSION: 1. Persistent changes of congestive heart failure pulmonary edema. Small pleural effusions. 2. Reference is made to recent chest CT which demonstrated small pulmonary nodules. Follow-up is recommended as on prior CT report.   Electronically Signed   By: Marcello Moores  Register   On: 07/07/2014 07:26   Assessment/Plan:  1 subacute bacterial endocarditis-enterococcus on initial blood cultures with most recent cultures negative. Patient afebrile. Continue ampicillin and gentamicin. Infectious disease following. Transesophageal echocardiogram showed aortic valve vegetation with moderate aortic insufficiency. There was also severe mitral regurgitation. Patient will need aortic valve replacement and either mitral valve replacement or mitral valve repair. His pacemaker has been removed. He will need cardiac catheterization prior to surgery. There  will be increased risk with catheterization both for contrast nephropathy (baseline renal insuff and presently being treated with gentamicin) and most likely for CVA as well (proximity of catheter to aortic valve vegetations and risk of embolism). Patient understands risk and is agreeable to proceed. We will plan most likely to proceed on Friday if renal function stable and platelet count acceptable. He may require platelet transfusion prior to the procedure but will await FU plt counts and guidance from hematology. Would recommend holding lasix Friday morning prior to the procedure. Limit dye and no ventriculogram. Follow renal function closely following procedure. Hopefully can proceed with valve surgery early next week.  2 history of paroxysmal atrial fibrillation with post-termination pauses -anticoagulation is on hold given thrombocytopenia. Pacemaker has been removed. Telemetry shows he is now back in atrial fibrillation. Will require Pacemaker replacement once he recovers from valve surgery.  3 stage III chronic kidney disease-follow renal function closely given use of gentamicin and upcoming catheterization and valve surgery.  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 Hematology following and managing. Will await final recs on need for platelet transfusion prior to cath 5 hypertension-given aortic insufficiency and mitral regurgitation need to have blood pressure well controlled. We will avoid beta blockade given history of bradycardia. Avoid ACE inhibitors given renal insufficiency. Continue Norvasc.  6 chronic diastolic heart failure-patient is volume overloaded on examination. His heart failure is most likely from his mitral regurgitation and aortic insufficiency. Will continue present dose of Lasix for now since he is diuresing well with it and is 4.9L net neg. Will not increase as I would like to keep his renal function stable prior to catheterization and aortic valve replacement. Hold Lasix Friday  morning prior to catheterization.    Justin Margarita, MD  07/16/2014  9:50 AM

## 2014-07-16 NOTE — Progress Notes (Signed)
Patient ID: Justin [name], male   DOB: 06-09-1933, 80 y.o.   MRN: [id number]         [facility] for Infectious Disease    Date of Admission:  07/28/2014    Total days of antibiotics 11        Day 5 ampicillin        Day 5 gentamicin         Principal Problem:   Enterococcal bacteremia Active Problems:   CAD (coronary artery disease)   Anemia   Thrombocytopenia   Pacemaker-Medtronic   Mitral regurgitation   Sepsis   TIA (transient ischemic attack)   . sodium chloride   Intravenous Once  . amLODipine  5 mg Oral Daily  . ampicillin (OMNIPEN) IV  2 g Intravenous 4 times per day  . atorvastatin  10 mg Oral QPM  . feeding supplement (ENSURE COMPLETE)  237 mL Oral TID WC  . [START ON 07/18/2014] furosemide  80 mg Oral Daily  . gentamicin  100 mg Intravenous Q24H  . insulin aspart  0-9 Units Subcutaneous TID WC  . pantoprazole  40 mg Oral Daily  . predniSONE  40 mg Oral Q breakfast  . pyridOXINE  100 mg Oral QPM  . tamsulosin  0.4 mg Oral QPC supper  . vitamin E  400 Units Oral Daily    Subjective: He states that he is feeling better.  Past Medical History  Diagnosis Date  . CAD (coronary artery disease) prior stenting 1999   . Dyslipidemia   . HTN (hypertension)   . WPW (Wolff-Parkinson-White syndrome) loss of preexcitation   . Atrial fibrillation   . AV block, Mobitz II   . Presyncope   . Myocardial infarction   . Pacemaker 04/07/2013  . Arthritis   . History of chicken pox   . Anemia   . Hypernatremia 06/03/2013  . Thrombocytopenia, unspecified 06/03/2013  . Leukopenia 06/03/2013  . Obstructive sleep apnea 06/03/2013    USES CPAP  . Urinary incontinence 06/03/2013  . Hyperglycemia 12/21/2013  . Pancytopenia 06/03/2013  . Iron deficiency anemia, unspecified 07/02/2014  . Malabsorption of iron 07/02/2014  . Hypotestosteronemia 07/02/2014  . ITP (idiopathic thrombocytopenic purpura) 07/02/2014    History  Substance Use Topics  . Smoking status: Former Smoker -- 1.00  packs/day for 36 years    Types: Cigarettes    Start date: 01/25/1955    Quit date: 12/05/1991  . Smokeless tobacco: Never Used     Comment: quit smoking 22 yeras ago  . Alcohol Use: Yes     Comment: Occasional    Family History  Problem Relation Age of Onset  . Stroke Mother   . Hypertension Mother   . Stroke Sister   . Arthritis Sister     rheumatoid  . Heart attack Sister   . Anemia Sister   . Other Brother     tube put in aorta  . Anemia Brother   . Other Son     cortisone deficiency  . Arthritis Son   . Stroke Brother   . Alcohol abuse Brother   . Barrett's esophagus Son   . Colon cancer Maternal Grandmother   . Colon cancer Maternal Grandfather     No Known Allergies  Objective: Temp:  [98.1 F (36.7 C)-98.5 F (36.9 C)] 98.1 F (36.7 C) (08/13 1200) Pulse Rate:  [67-72] 68 (08/13 0344) Resp:  [19-22] 22 (08/13 0344) BP: (125-162)/(37-51) 125/37 mmHg (08/13 1200) SpO2:  [92 %-99 %] 95 % (  08/13 1200) Weight:  [190 lb 0.6 oz (86.2 kg)] 190 lb 0.6 oz (86.2 kg) (08/13 0344)  General: He is currently working with his physical therapist Skin: No rash. No splinter or conjunctival hemorrhages Lungs: Clear Cor: No change in 2/6 systolic murmur  Lab Results Lab Results  Component Value Date   WBC 7.4 07/16/2014   HGB 10.3* 07/16/2014   HCT 32.1* 07/16/2014   MCV 102.6* 07/16/2014   PLT 39* 07/16/2014    Lab Results  Component Value Date   CREATININE 1.40* 07/16/2014   BUN 28* 07/16/2014   NA 143 07/16/2014   K 3.9 07/16/2014   CL 104 07/16/2014   CO2 27 07/16/2014    Lab Results  Component Value Date   ALT 36 07/16/2014   AST 41* 07/16/2014   ALKPHOS 59 07/16/2014   BILITOT 0.8 07/16/2014      Microbiology: Recent Results (from the past 240 hour(s))  CULTURE, BLOOD (ROUTINE X 2)     Status: None   Collection Time    07/26/2014  3:30 PM      Result Value Ref Range Status   Specimen Description BLOOD LEFT ARM   Final   Special Requests BOTTLES DRAWN AEROBIC  AND ANAEROBIC [facility]   Final   Culture  Setup Time     Final   Value: 07/17/2014 19:23     Performed at [facility]   Culture     Final   Value: ENTEROCOCCUS SPECIES     Note: SUSCEPTIBILITIES PERFORMED ON PREVIOUS CULTURE WITHIN THE LAST 5 DAYS.     Note: Gram Stain Report Called to,Read Back By and Verified With: JAMES ARTIS 07/07/14 AT 0730 RIDK     Performed at [facility]   Report Status 07/09/2014 FINAL   Final  URINE CULTURE     Status: None   Collection Time    07/10/2014  4:18 PM      Result Value Ref Range Status   Specimen Description URINE, CLEAN CATCH   Final   Special Requests NONE   Final   Culture  Setup Time     Final   Value: 07/07/2014 02:46     Performed at [place]     Final   Value: 35,000 COLONIES/ML     Performed at [facility]   Culture     Final   Value: ENTEROBACTER CLOACAE     Performed at [facility]   Report Status 07/09/2014 FINAL   Final   Organism ID, Bacteria ENTEROBACTER CLOACAE   Final  CULTURE, BLOOD (ROUTINE X 2)     Status: None   Collection Time    07/13/2014  4:30 PM      Result Value Ref Range Status   Specimen Description BLOOD R ARM   Final   Special Requests BOTTLES DRAWN AEROBIC AND ANAEROBIC [place]   Final   Culture  Setup Time     Final   Value: 07/28/2014 19:22     Performed at [facility]   Culture     Final   Value: ENTEROCOCCUS SPECIES     Note: Gram Stain Report Called to,Read Back By and Verified With: JAMES ARTIS 07/07/14 AT 0730 RIDK     Performed at [facility]   Report Status 07/09/2014 FINAL   Final   Organism ID, Bacteria ENTEROCOCCUS SPECIES   Final  MRSA PCR SCREENING     Status: None  Collection Time    07/12/2014  7:52 PM      Result Value Ref Range Status   MRSA by PCR NEGATIVE  NEGATIVE Final   Comment:            The GeneXpert MRSA Assay (FDA     approved for NASAL specimens     only), is one component of a     comprehensive MRSA  colonization     surveillance program. It is not     intended to diagnose MRSA     infection nor to guide or     monitor treatment for     MRSA infections.  CULTURE, BLOOD (ROUTINE X 2)     Status: None   Collection Time    07/08/14 12:00 PM      Result Value Ref Range Status   Specimen Description BLOOD RIGHT HAND   Final   Special Requests BOTTLES DRAWN AEROBIC ONLY [facility]   Final   Culture  Setup Time     Final   Value: 07/08/2014 17:14     Performed at [facility]   Culture     Final   Value: NO GROWTH 5 DAYS     Performed at [facility]   Report Status 07/07/2014 FINAL   Final  CULTURE, BLOOD (ROUTINE X 2)     Status: None   Collection Time    07/08/14 12:10 PM      Result Value Ref Range Status   Specimen Description BLOOD LEFT HAND   Final   Special Requests BOTTLES DRAWN AEROBIC ONLY [place]   Final   Culture  Setup Time     Final   Value: 07/08/2014 17:05     Performed at [facility]   Culture     Final   Value: NO GROWTH 5 DAYS     Performed at [facility]   Report Status 07/05/2014 FINAL   Final  FECAL OCCULT BLOOD, IMMUNOCHEMICAL     Status: None   Collection Time    07/13/14  5:24 PM      Result Value Ref Range Status   Fecal Occult Bld Negative  Negative Final  MRSA PCR SCREENING     Status: None   Collection Time    07/04/2014  6:17 PM      Result Value Ref Range Status   MRSA by PCR NEGATIVE  NEGATIVE Final   Comment:            The GeneXpert MRSA Assay (FDA     approved for NASAL specimens     only), is one component of a     comprehensive MRSA colonization     surveillance program. It is not     intended to diagnose MRSA     infection nor to guide or     monitor treatment for     MRSA infections.    Studies/Results: Dg Orthopantogram  07/15/2014   CLINICAL DATA:  Endocarditis.  EXAM: ORTHOPANTOGRAM/PANORAMIC  COMPARISON:  None.  FINDINGS: There are multiple missing teeth and numerous metallic restorations. However,  there is no evidence of a periapical abscess or osteomyelitis or other acute abnormality.  IMPRESSION: No periapical abscesses or other acute abnormalities.   Electronically Signed   By: Rozetta Nunnery M.D.   On: 07/15/2014 21:43   Dg Chest 2 View  07/15/2014   CLINICAL DATA:  Pacemaker extraction  EXAM: CHEST  2 VIEW  COMPARISON:  07/07/2014  FINDINGS:  Cardiac pacer has been removed with generator and 2 leads absent. Mild interstitial change in peribronchial cuffing. These findings are less prominent when compared to the prior study. There is consolidation in the medial left lung base which appears most consistent with atelectasis.  IMPRESSION: 1. Mild residual interstitial change may reflect minimal remaining interstitial pulmonary edema 2. Left lower lobe atelectasis   Electronically Signed   By: Skipper Cliche M.D.   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 Continue current antibiotics 2. Monitor gentamicin levels and renal function closely  Michel Bickers, [place] for [place] [phone] pager   [phone] cell 07/16/2014, 2:00 PM

## 2014-07-17 ENCOUNTER — Encounter (HOSPITAL_COMMUNITY)
Admission: EM | Disposition: E | Payer: Self-pay | Source: Home / Self Care | Attending: Thoracic Surgery (Cardiothoracic Vascular Surgery)

## 2014-07-17 ENCOUNTER — Other Ambulatory Visit: Payer: Medicare Other | Admitting: Lab

## 2014-07-17 ENCOUNTER — Ambulatory Visit: Payer: Medicare Other | Admitting: Hematology & Oncology

## 2014-07-17 DIAGNOSIS — I330 Acute and subacute infective endocarditis: Secondary | ICD-10-CM

## 2014-07-17 DIAGNOSIS — I059 Rheumatic mitral valve disease, unspecified: Secondary | ICD-10-CM

## 2014-07-17 HISTORY — PX: CORONARY ANGIOGRAM: SHX5466

## 2014-07-17 LAB — LACTATE DEHYDROGENASE: LDH: 566 U/L — ABNORMAL HIGH (ref 94–250)

## 2014-07-17 LAB — HEMOGLOBIN A1C
Hgb A1c MFr Bld: 5.1 % (ref <5.7)
Mean Plasma Glucose: 100 mg/dL (ref <117)

## 2014-07-17 LAB — COMPREHENSIVE METABOLIC PANEL
ALK PHOS: 59 U/L (ref 39–117)
ALT: 35 U/L (ref 0–53)
ANION GAP: 14 (ref 5–15)
AST: 40 U/L — ABNORMAL HIGH (ref 0–37)
Albumin: 2.5 g/dL — ABNORMAL LOW (ref 3.5–5.2)
BILIRUBIN TOTAL: 0.7 mg/dL (ref 0.3–1.2)
BUN: 26 mg/dL — AB (ref 6–23)
CHLORIDE: 100 meq/L (ref 96–112)
CO2: 27 meq/L (ref 19–32)
Calcium: 8.2 mg/dL — ABNORMAL LOW (ref 8.4–10.5)
Creatinine, Ser: 1.47 mg/dL — ABNORMAL HIGH (ref 0.50–1.35)
GFR, EST AFRICAN AMERICAN: 51 mL/min — AB (ref >90)
GFR, EST NON AFRICAN AMERICAN: 44 mL/min — AB (ref >90)
Glucose, Bld: 105 mg/dL — ABNORMAL HIGH (ref 70–99)
Potassium: 3.7 mEq/L (ref 3.7–5.3)
Sodium: 141 mEq/L (ref 137–147)
Total Protein: 5.9 g/dL — ABNORMAL LOW (ref 6.0–8.3)

## 2014-07-17 LAB — GLUCOSE, CAPILLARY
GLUCOSE-CAPILLARY: 131 mg/dL — AB (ref 70–99)
GLUCOSE-CAPILLARY: 215 mg/dL — AB (ref 70–99)
Glucose-Capillary: 121 mg/dL — ABNORMAL HIGH (ref 70–99)
Glucose-Capillary: 138 mg/dL — ABNORMAL HIGH (ref 70–99)

## 2014-07-17 LAB — CBC
HEMATOCRIT: 32.2 % — AB (ref 39.0–52.0)
Hemoglobin: 10.4 g/dL — ABNORMAL LOW (ref 13.0–17.0)
MCH: 33.2 pg (ref 26.0–34.0)
MCHC: 32.3 g/dL (ref 30.0–36.0)
MCV: 102.9 fL — ABNORMAL HIGH (ref 78.0–100.0)
Platelets: 36 10*3/uL — ABNORMAL LOW (ref 150–400)
RBC: 3.13 MIL/uL — ABNORMAL LOW (ref 4.22–5.81)
RDW: 23.9 % — ABNORMAL HIGH (ref 11.5–15.5)
WBC: 8.2 10*3/uL (ref 4.0–10.5)

## 2014-07-17 LAB — TSH: TSH: 4.75 u[IU]/mL — ABNORMAL HIGH (ref 0.350–4.500)

## 2014-07-17 SURGERY — CORONARY ANGIOGRAM
Anesthesia: LOCAL

## 2014-07-17 MED ORDER — MIDAZOLAM HCL 2 MG/2ML IJ SOLN
INTRAMUSCULAR | Status: AC
  Filled 2014-07-17: qty 2

## 2014-07-17 MED ORDER — IMMUNE GLOBULIN (HUMAN) 10 GM/100ML IV SOLN
400.0000 mg/kg | INTRAVENOUS | Status: AC
  Administered 2014-07-17 – 2014-07-19 (×3): 35 g via INTRAVENOUS
  Filled 2014-07-17 (×3): qty 350

## 2014-07-17 MED ORDER — VERAPAMIL HCL 2.5 MG/ML IV SOLN
INTRAVENOUS | Status: AC
  Filled 2014-07-17: qty 2

## 2014-07-17 MED ORDER — FENTANYL CITRATE 0.05 MG/ML IJ SOLN
INTRAMUSCULAR | Status: AC
  Filled 2014-07-17: qty 2

## 2014-07-17 MED ORDER — HEPARIN SODIUM (PORCINE) 1000 UNIT/ML IJ SOLN
INTRAMUSCULAR | Status: AC
Start: 2014-07-17 — End: 2014-07-17
  Filled 2014-07-17: qty 1

## 2014-07-17 MED ORDER — CHLORHEXIDINE GLUCONATE 0.12 % MT SOLN
15.0000 mL | Freq: Two times a day (BID) | OROMUCOSAL | Status: DC
  Administered 2014-07-17 – 2014-07-23 (×13): 15 mL via OROMUCOSAL
  Filled 2014-07-17 (×12): qty 15

## 2014-07-17 MED ORDER — AMLODIPINE BESYLATE 5 MG PO TABS
7.5000 mg | ORAL_TABLET | Freq: Every day | ORAL | Status: DC
  Administered 2014-07-17 – 2014-07-20 (×4): 7.5 mg via ORAL
  Filled 2014-07-17 (×4): qty 1

## 2014-07-17 MED ORDER — SODIUM CHLORIDE 0.9 % IV SOLN
INTRAVENOUS | Status: AC
  Administered 2014-07-17: 75 mL/h via INTRAVENOUS

## 2014-07-17 NOTE — Progress Notes (Signed)
Patient ID: Justin [name], male   DOB: [birth year], 80 y.o.   MRN: [id number]         [facility] for Infectious Disease    Date of Admission:  07/17/2014    Total days of antibiotics 12        Day 6 ampicillin        Day 6 gentamicin         Principal Problem:   Enterococcal bacteremia Active Problems:   CAD (coronary artery disease)   Anemia   Thrombocytopenia   Pacemaker-Medtronic   Mitral regurgitation   Sepsis   TIA (transient ischemic attack)   . sodium chloride   Intravenous Once  . amLODipine  7.5 mg Oral Daily  . ampicillin (OMNIPEN) IV  2 g Intravenous 4 times per day  . atorvastatin  10 mg Oral QPM  . chlorhexidine  15 mL Mouth/Throat BID  . feeding supplement (ENSURE COMPLETE)  237 mL Oral TID WC  . [START ON 07/18/2014] furosemide  80 mg Oral Daily  . gentamicin  100 mg Intravenous Q24H  . IMMUNE GLOBULIN 10% (HUMAN) IV - For Fluid Restriction Only  400 mg/kg Intravenous Q24H  . insulin aspart  0-9 Units Subcutaneous TID WC  . pantoprazole  40 mg Oral Daily  . predniSONE  40 mg Oral Q breakfast  . pyridOXINE  100 mg Oral QPM  . sodium chloride  10-40 mL Intracatheter Q12H  . sodium chloride  10-40 mL Intracatheter Q12H  . sodium chloride  3 mL Intravenous Q12H  . tamsulosin  0.4 mg Oral QPC supper  . vitamin E  400 Units Oral Daily    Subjective: He states that he is feeling better.  Past Medical History  Diagnosis Date  . CAD (coronary artery disease) prior stenting 1999   . Dyslipidemia   . HTN (hypertension)   . WPW (Wolff-Parkinson-White syndrome) loss of preexcitation   . Atrial fibrillation   . AV block, Mobitz II   . Presyncope   . Myocardial infarction   . Pacemaker 04/07/2013  . Arthritis   . History of chicken pox   . Anemia   . Hypernatremia 06/03/2013  . Thrombocytopenia, unspecified 06/03/2013  . Leukopenia 06/03/2013  . Obstructive sleep apnea 06/03/2013    USES CPAP  . Urinary incontinence 06/03/2013  . Hyperglycemia 12/21/2013  .  Pancytopenia 06/03/2013  . Iron deficiency anemia, unspecified 07/02/2014  . Malabsorption of iron 07/02/2014  . Hypotestosteronemia 07/02/2014  . ITP (idiopathic thrombocytopenic purpura) 07/02/2014    History  Substance Use Topics  . Smoking status: Former Smoker -- 1.00 packs/day for 36 years    Types: Cigarettes    Start date: 01/25/1955    Quit date: 12/05/1991  . Smokeless tobacco: Never Used     Comment: quit smoking 22 yeras ago  . Alcohol Use: Yes     Comment: Occasional    Family History  Problem Relation Age of Onset  . Stroke Mother   . Hypertension Mother   . Stroke Sister   . Arthritis Sister     rheumatoid  . Heart attack Sister   . Anemia Sister   . Other Brother     tube put in aorta  . Anemia Brother   . Other Son     cortisone deficiency  . Arthritis Son   . Stroke Brother   . Alcohol abuse Brother   . Barrett's esophagus Son   . Colon cancer Maternal Grandmother   . Colon  cancer Maternal Grandfather     No Known Allergies  Objective: Temp:  [98 F (36.7 C)-98.6 F (37 C)] 98 F (36.7 C) (08/14 0930) Pulse Rate:  [70-78] 73 (08/14 0351) Resp:  [18-22] 22 (08/14 0930) BP: (125-165)/(37-57) 157/53 mmHg (08/14 0930) SpO2:  [94 %-100 %] 96 % (08/14 0800) Weight:  [191 lb 5.8 oz (86.8 kg)] 191 lb 5.8 oz (86.8 kg) (08/14 0351)  General: He is currently visiting with family Lungs: Clear Cor: No change in 2/6 systolic murmur  Lab Results Lab Results  Component Value Date   WBC 8.2 07/31/2014   HGB 10.4* 07/13/2014   HCT 32.2* 08/02/2014   MCV 102.9* 07/09/2014   PLT 36* 07/29/2014    Lab Results  Component Value Date   CREATININE 1.47* 07/04/2014   BUN 26* 07/28/2014   NA 141 07/16/2014   K 3.7 08/01/2014   CL 100 08/03/2014   CO2 27 07/25/2014    Lab Results  Component Value Date   ALT 35 08/01/2014   AST 40* 07/09/2014   ALKPHOS 59 07/18/2014   BILITOT 0.7 07/31/2014      Microbiology: Recent Results (from the past 240 hour(s))  CULTURE,  BLOOD (ROUTINE X 2)     Status: None   Collection Time    07/08/14 12:00 PM      Result Value Ref Range Status   Specimen Description BLOOD RIGHT HAND   Final   Special Requests BOTTLES DRAWN AEROBIC ONLY [facility]   Final   Culture  Setup Time     Final   Value: 07/08/2014 17:14     Performed at [facility]   Culture     Final   Value: NO GROWTH 5 DAYS     Performed at [facility]   Report Status 07/25/2014 FINAL   Final  CULTURE, BLOOD (ROUTINE X 2)     Status: None   Collection Time    07/08/14 12:10 PM      Result Value Ref Range Status   Specimen Description BLOOD LEFT HAND   Final   Special Requests BOTTLES DRAWN AEROBIC ONLY [facility]   Final   Culture  Setup Time     Final   Value: 07/08/2014 17:05     Performed at [facility]   Culture     Final   Value: NO GROWTH 5 DAYS     Performed at [facility]   Report Status 07/25/2014 FINAL   Final  FECAL OCCULT BLOOD, IMMUNOCHEMICAL     Status: None   Collection Time    07/13/14  5:24 PM      Result Value Ref Range Status   Fecal Occult Bld Negative  Negative Final  MRSA PCR SCREENING     Status: None   Collection Time    07/13/2014  6:17 PM      Result Value Ref Range Status   MRSA by PCR NEGATIVE  NEGATIVE Final   Comment:            The GeneXpert MRSA Assay (FDA     approved for NASAL specimens     only), is one component of a     comprehensive MRSA colonization     surveillance program. It is not     intended to diagnose MRSA     infection nor to guide or     monitor treatment for     MRSA infections.    Studies/Results: Dg Orthopantogram  07/15/2014  CLINICAL DATA:  Endocarditis.  EXAM: ORTHOPANTOGRAM/PANORAMIC  COMPARISON:  None.  FINDINGS: There are multiple missing teeth and numerous metallic restorations. However, there is no evidence of a periapical abscess or osteomyelitis or other acute abnormality.  IMPRESSION: No periapical abscesses or other acute abnormalities.   Electronically  Signed   By: Rozetta Nunnery M.D.   On: 07/15/2014 21:43   Dg Chest Port 1 View  07/16/2014   CLINICAL DATA:  Line placement  EXAM: PORTABLE CHEST - 1 VIEW  COMPARISON:  None.  FINDINGS: Right-sided PICC line with the tip projecting over the SVC.  Small left pleural effusion. Trace right pleural effusion with right basilar airspace disease which may reflect atelectasis versus pneumonia. No pneumothorax.  Stable cardiomediastinal silhouette. Thoracic aortic atherosclerosis. Unremarkable osseous structures.  IMPRESSION: 1. Right-sided PICC line with the tip projecting over the SVC. 2. Bilateral small pleural effusions. Right basilar airspace disease which may reflect atelectasis versus pneumonia.   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 Continue current antibiotics 2. Monitor gentamicin levels and renal function closely 3. Please call Dr. Talbot Grumbling [phone] for any infectious disease questions this weekend.  Michel Bickers, MD [facility] for Infectious [place] [facility] [phone] pager   [phone] cell 08/02/2014, 10:26 AM

## 2014-07-17 NOTE — Interval H&P Note (Signed)
History and Physical Interval Note:  07/04/2014 10:51 AM Cath Lab Visit (complete for each Cath Lab visit)  Clinical Evaluation Leading to the Procedure:   ACS: No.  Non-ACS:    Anginal Classification: No Symptoms  Anti-ischemic medical therapy: No Therapy  Non-Invasive Test Results: No non-invasive testing performed  Prior CABG: No previous CABG        Justin [name]  has presented today for surgery, with the diagnosis of a  The various methods of treatment have been discussed with the patient and family. After consideration of risks, benefits and other options for treatment, the patient has consented to  Procedure(s): CORONARY ANGIOGRAM (N/A) as a surgical intervention .  The patient's history has been reviewed, patient examined, no change in status, stable for surgery.  I have reviewed the patient's chart and labs.  Questions were answered to the patient's satisfaction.     [name]

## 2014-07-17 NOTE — Progress Notes (Signed)
SUBJECTIVE:  No complaints  OBJECTIVE:   Vitals:   Filed Vitals:   07/07/2014 0351 07/16/2014 0615 08/01/2014 0630 07/22/2014 0700  BP: 161/56 164/55 165/54 163/49  Pulse: 73     Temp: 98.2 F (36.8 C) 98.4 F (36.9 C) 98 F (36.7 C) 98.6 F (37 C)  TempSrc: Oral Oral Oral Oral  Resp: 19 18 21 18   Height:      Weight: 191 lb 5.8 oz (86.8 kg)     SpO2: 98% 97% 96% 95%   I&O's:   Intake/Output Summary (Last 24 hours) at 07/29/2014 0749 Last data filed at 07/30/2014 0700  Gross per 24 hour  Intake  727.5 ml  Output   1650 ml  Net -922.5 ml   TELEMETRY: Reviewed telemetry pt in NSR:     PHYSICAL EXAM General: Well developed, well nourished, in no acute distress Head: Eyes PERRLA, No xanthomas.   Normal cephalic and atramatic  Lungs:   Clear bilaterally to auscultation and percussion. Heart:   HRRR S1 S2 Pulses are 2+ & equal. Abdomen: Bowel sounds are positive, abdomen soft and non-tender without masses  Extremities:   2+ edema Neuro: Alert and oriented X 3. Psych:  Good affect, responds appropriately   LABS: Basic Metabolic Panel:  Recent Labs  07/16/14 2200 07/14/2014 0500  NA 139 141  K 4.3 3.7  CL 99 100  CO2 25 27  GLUCOSE 157* 105*  BUN 29* 26*  CREATININE 1.54* 1.47*  CALCIUM 8.1* 8.2*   Liver Function Tests:  Recent Labs  07/16/14 0357 07/27/2014 0500  AST 41* 40*  ALT 36 35  ALKPHOS 59 59  BILITOT 0.8 0.7  PROT 5.8* 5.9*  ALBUMIN 2.2* 2.5*   No results found for this basename: LIPASE, AMYLASE,  in the last 72 hours CBC:  Recent Labs  07/16/14 2200 07/31/2014 0500  WBC 8.2 8.2  HGB 10.9* 10.4*  HCT 34.6* 32.2*  MCV 103.9* 102.9*  PLT 41* 36*   Cardiac Enzymes: No results found for this basename: CKTOTAL, CKMB, CKMBINDEX, TROPONINI,  in the last 72 hours BNP: No components found with this basename: POCBNP,  D-Dimer: No results found for this basename: DDIMER,  in the last 72 hours Hemoglobin A1C: No results found for this basename:  HGBA1C,  in the last 72 hours Fasting Lipid Panel: No results found for this basename: CHOL, HDL, LDLCALC, TRIG, CHOLHDL, LDLDIRECT,  in the last 72 hours Thyroid Function Tests:  Recent Labs  07/14/2014 0500  TSH 4.750*   Anemia Panel: No results found for this basename: VITAMINB12, FOLATE, FERRITIN, TIBC, IRON, RETICCTPCT,  in the last 72 hours Coag Panel:   Lab Results  Component Value Date   INR 1.21 07/16/2014   INR 1.48 08/02/2014    RADIOLOGY: Ct Abdomen Pelvis Wo Contrast  07/09/2014   CLINICAL DATA:  Enterococcus bacteremia. Evaluate for abdominal source. Evaluate for potential tumor. Enterobacter is also present in the urine.  EXAM: CT ABDOMEN AND PELVIS WITHOUT CONTRAST  TECHNIQUE: Multidetector CT imaging of the abdomen and pelvis was performed following the standard protocol without IV contrast.  COMPARISON:  No priors.  FINDINGS: Lung Bases: Atherosclerotic calcifications in the right coronary artery. Pacemaker lead terminating in the right ventricular apex. Moderate-sized hiatal hernia. Moderate to large bilateral pleural effusions which appear to layer dependently and are associated with extensive passive atelectasis in the visualized portions of the lower lobes of the lungs bilaterally.  Abdomen/Pelvis: 9 mm low attenuation lesion in segment 2  of the liver is incompletely characterized on today's noncontrast CT examination, but is unchanged, and favored to represent a tiny cyst. No other focal hepatic lesions are identified on today's non contrast CT examination. There is a tiny calcifications in the head of the pancreas (image 31 of series 2) which appears to be immediately posterior to the distal common bile duct, potentially related to prior episodes of pancreatitis. Other small calcifications are also noted in the head and tail of the pancreas as well. The calcification in the head of the pancreas is not favored to be within the common bile duct, as the common bile duct is normal  in caliber measuring 4 mm, and there is no intrahepatic biliary ductal dilatation to suggest obstruction. The unenhanced appearance of the gallbladder, spleen and bilateral adrenal glands is unremarkable. There is bilateral perinephric stranding which is nonspecific. 1.5 cm intermediate attenuation lesion extending exophytically off the lateral aspect of the upper pole of the right kidney is incompletely characterized on today's non contrast CT examination, but is statistically likely to represent a mildly proteinaceous cyst.  Normal appendix. Trace volume of ascites. No pneumoperitoneum. No pathologic distention of small bowel. 10 mm short axis inguinal lymph nodes bilaterally are nonspecific. No other definite lymphadenopathy identified in the abdomen or pelvis on today's non contrast CT examination. Mild diffuse mesenteric edema. Prostate gland is enlarged measuring 4.9 x 6.1 cm. Urinary bladder is generally unremarkable in appearance, however, the attenuation values in the urine are rather high ranging from 45-55 HU, suggesting proteinaceous or hemorrhagic contents.  Musculoskeletal: Mild diffuse body wall edema. There are no aggressive appearing lytic or blastic lesions noted in the visualized portions of the skeleton.  IMPRESSION: 1. No definite source for bacteremia identified on today's examination. 2. Relatively high attenuation of the urine is of uncertain etiology and significance, but may suggest some proteinaceous or hemorrhagic contents. 3. Findings suggestive of anasarca, including moderate to large bilateral pleural effusions, trace volume of ascites, mild diffuse mesenteric edema and diffuse body wall edema. 4. Prostatomegaly. 5. Additional incidental findings, as above.   Electronically Signed   By: Vinnie Langton M.D.   On: 07/09/2014 12:46   Dg Orthopantogram  07/15/2014   CLINICAL DATA:  Endocarditis.  EXAM: ORTHOPANTOGRAM/PANORAMIC  COMPARISON:  None.  FINDINGS: There are multiple missing  teeth and numerous metallic restorations. However, there is no evidence of a periapical abscess or osteomyelitis or other acute abnormality.  IMPRESSION: No periapical abscesses or other acute abnormalities.   Electronically Signed   By: Rozetta Nunnery M.D.   On: 07/15/2014 21:43   Dg Chest 2 View  07/15/2014   CLINICAL DATA:  Pacemaker extraction  EXAM: CHEST  2 VIEW  COMPARISON:  07/07/2014  FINDINGS: Cardiac pacer has been removed with generator and 2 leads absent. Mild interstitial change in peribronchial cuffing. These findings are less prominent when compared to the prior study. There is consolidation in the medial left lung base which appears most consistent with atelectasis.  IMPRESSION: 1. Mild residual interstitial change may reflect minimal remaining interstitial pulmonary edema 2. Left lower lobe atelectasis   Electronically Signed   By: Skipper Cliche M.D.   On: 07/15/2014 08:15   Dg Chest 2 View  08/02/2014   CLINICAL DATA:  Fever, chills, shortness of breath.  EXAM: CHEST  2 VIEW  COMPARISON:  06/10/2014  FINDINGS: Left pacer in place with leads in the right atrium and right ventricle. Heart is normal size. Mild peribronchial thickening. No confluent  airspace opacities or effusions. No acute bony abnormality.  IMPRESSION: Bronchitic changes.   Electronically Signed   By: Rolm Baptise M.D.   On: 07/12/2014 15:07   Ct Head Wo Contrast  07/09/2014   CLINICAL DATA:  Hallucinations.  Thrombocytopenia  EXAM: CT HEAD WITHOUT CONTRAST  TECHNIQUE: Contiguous axial images were obtained from the base of the skull through the vertex without intravenous contrast.  COMPARISON:  None.  FINDINGS: Mild atrophy. Negative for hemorrhage. Negative for acute infarct or mass. Calvarium intact.  IMPRESSION: No acute abnormality.   Electronically Signed   By: Franchot Gallo M.D.   On: 07/09/2014 18:47   Ct Angio Chest W/cm &/or Wo Cm  07/27/2014   CLINICAL DATA:  Shortness of breath and fever  EXAM: CT ANGIOGRAPHY  CHEST WITH CONTRAST  TECHNIQUE: Multidetector CT imaging of the chest was performed using the standard protocol during bolus administration of intravenous contrast. Multiplanar CT image reconstructions and MIPs were obtained to evaluate the vascular anatomy.  CONTRAST:  79mL OMNIPAQUE IOHEXOL 350 MG/ML SOLN  COMPARISON:  Chest radiograph July 06, 2014  FINDINGS: There is no demonstrable pulmonary embolus. There is no thoracic aortic aneurysm or dissection.  There are free-flowing pleural effusions bilaterally. There is hazy ground-glass opacity diffusely.  On axial CT slice 55 series 6, there is a 6 x 4 mm nodular opacity in the medial segment of the right middle lobe. On axial slice 64 series 6, there is a 6 x 4 mm nodular opacity in the lateral segment of the left lower lobe. On axial slice 62 series 6, there is a 7 x 5 mm nodular opacity in the lateral segment of the right middle lobe. On axial slice 69 series 6, there is a 5 x 4 mm nodular opacity in the superior segment of the right lower lobe.  There is no appreciable thoracic adenopathy. Pericardium is not thickened. Pacemaker leads are attached to the right atrium and right ventricle. There is a focal hiatal hernia.  Visualized upper abdominal structures appear unremarkable. There are no blastic or lytic bone lesions. There is degenerative change in the thoracic spine. Thyroid appears unremarkable.  Review of the MIP images confirms the above findings.  IMPRESSION: No demonstrable pulmonary embolus.  Bilateral effusions with extensive ground-glass opacity bilaterally. Suspect alveolar edema with findings consistent with congestive heart failure. Atypical infection or allergic type phenomenon are differential considerations, however.  Several nodular opacities, largest measuring 7 mm. Followup of these nodular opacity should be based on Fleischner Society guidelines. If the patient is at high risk for bronchogenic carcinoma, follow-up chest CT at 3-75months  is recommended. If the patient is at low risk for bronchogenic carcinoma, follow-up chest CT at 6-12 months is recommended. This recommendation follows the consensus statement: Guidelines for Management of Small Pulmonary Nodules Detected on CT Scans: A Statement from the [employer] as published in Radiology 2005; 237:395-400.  Focal hiatal hernia.  No adenopathy.   Electronically Signed   By: Lowella Grip M.D.   On: 07/22/2014 18:09   Dg Chest Port 1 View  07/16/2014   CLINICAL DATA:  Line placement  EXAM: PORTABLE CHEST - 1 VIEW  COMPARISON:  None.  FINDINGS: Right-sided PICC line with the tip projecting over the SVC.  Small left pleural effusion. Trace right pleural effusion with right basilar airspace disease which may reflect atelectasis versus pneumonia. No pneumothorax.  Stable cardiomediastinal silhouette. Thoracic aortic atherosclerosis. Unremarkable osseous structures.  IMPRESSION: 1. Right-sided PICC line with the  tip projecting over the SVC. 2. Bilateral small pleural effusions. Right basilar airspace disease which may reflect atelectasis versus pneumonia.   Electronically Signed   By: Kathreen Devoid   On: 07/16/2014 16:00   Dg Chest Port 1 View  07/07/2014   CLINICAL DATA:  Evaluate airspace.  EXAM: PORTABLE CHEST - 1 VIEW  COMPARISON:  CT 08/01/2014.  Chest x-ray 07/19/2014 and 06/10/2014.  FINDINGS: Cardiac pacer with lead tips in right atrium and right ventricle. Mediastinum and hilar structures are unremarkable. Cardiomegaly with mild pulmonary vascular prominence and interstitial prominence consistent with congestive heart failure. Small pleural effusions. No pneumothorax. Reference is made to recent chest CT which demonstrated small pulmonary nodules. These are not definitely identified by chest x-ray. No acute bony abnormality.  IMPRESSION: 1. Persistent changes of congestive heart failure pulmonary edema. Small pleural effusions. 2. Reference is made to recent chest CT which  demonstrated small pulmonary nodules. Follow-up is recommended as on prior CT report.   Electronically Signed   By: Marcello Moores  Register   On: 07/07/2014 07:26   Assessment/Plan:  1 subacute bacterial endocarditis-enterococcus on initial blood cultures with most recent cultures negative. Patient afebrile. Continue ampicillin and gentamicin. Infectious disease following. Transesophageal echocardiogram showed aortic valve vegetation with moderate aortic insufficiency. There was also severe mitral regurgitation. Patient will need aortic valve replacement and either mitral valve replacement or mitral valve repair. His pacemaker has been removed. He will need cardiac catheterization prior to surgery. There will be increased risk with catheterization both for contrast nephropathy (baseline renal insuff and presently being treated with gentamicin) and most likely for CVA as well (proximity of catheter to aortic valve vegetations and risk of embolism). Patient understands risk and is agreeable to proceed. We will plan cath today. Lasix on hold for cath. Limit dye and no ventriculogram. Follow renal function closely following procedure. Hopefully can proceed with valve surgery early next week.  2 history of paroxysmal atrial fibrillation with post-termination pauses -anticoagulation is on hold given thrombocytopenia. Pacemaker has been removed. Telemetry yesterday showed atrial fibrillation but now back in NSR. Will require Pacemaker replacement once he recovers from valve surgery.  3 stage III chronic kidney disease-follow renal function closely given use of gentamicin and upcoming catheterization and valve surgery.  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 Hematology following and managing. He received platelet transfusion this am. 5 hypertension-given aortic insufficiency and mitral regurgitation need to have blood pressure well controlled. We will avoid beta blockade given history of bradycardia. Avoid ACE inhibitors given  renal insufficiency.  Increase Amlodipine to 7.5mg  daily.  6 chronic diastolic heart failure-patient is volume overloaded on examination. His heart failure is most likely from his mitral regurgitation and aortic insufficiency. Will restart Lasix after catheterization.    Sueanne Margarita, MD  08/03/2014  7:49 AM

## 2014-07-17 NOTE — CV Procedure (Addendum)
     Left Heart Catheterization with Coronary Angiography  Report  Justin [name]  80 y.o.  male [birth year]  Procedure Date: 07/16/2014 Referring Physician: Ivin Poot, M.D. Primary Cardiologist: Kirk Ruths, M.D.  INDICATIONS: Coronary angiography to assess for obstructive disease prior to aortic and mitral valve surgery  PROCEDURE: 1. Left heart catheterization; 2. Coronary angiography  CONSENT:  The risks, benefits, and details of the procedure were explained in detail to the patient. Risks including death, stroke, heart attack, kidney injury, allergy, limb ischemia, bleeding and radiation injury were discussed.  The patient verbalized understanding and wanted to proceed.  Informed written consent was obtained.  PROCEDURE TECHNIQUE:  After Xylocaine anesthesia a 5 French Slender sheath was placed in the right radial artery with an angiocath and the modified Seldinger technique.  Coronary angiography was done using a 5 F JR 4 and JL 3.5 cm catheter.  Left ventriculography was done using the JR 4 catheter and hand injection.   The case was terminated after digital images were reviewed.   CONTRAST:  Total of 42 cc.  COMPLICATIONS:  None   HEMODYNAMICS:  Aortic pressure 129/56 mmHg  ANGIOGRAPHIC DATA:   The left main coronary artery is distal 30% narrowing..  The left anterior descending artery is patent and moderately calcified. The mid vessel stent is widely patent. Tandem 30 and 40% stenoses are noted throughout the mid vessel. The first diagonal contains 90% mid diffuse stenosis before it bifurcates and the larger limb of the bifurcation contains a 90% stenosis. The the vessel caliber is relatively small.  The left circumflex artery is moderately calcified. There is eccentric 70% ostial narrowing.  The circumflex gives origin to 2 obtuse marginal branches. The smaller branch contains ostial 70% narrowing.  The right coronary artery is dominant. Luminal irregularities  with up to 30-50% narrowing is noted throughout the mid and distal vessel. The same is true of the PDA. No significant stenoses are noted.Marland Kitchen   LEFT VENTRICULOGRAM:  Left ventricular angiogram was not performed.   IMPRESSIONS:  1. Nonobstructive distal left main  2. Widely patent LAD including a mid vessel stent. A small to moderate size first diagonal is severely diseased.  3. Heavily calcified ostial circumflex with eccentric 70% stenosis. Obtuse marginal #2 contains 50-70% stenosis and is a relatively small caliber and small distribution vessel.  4. Widely patent RCA with moderate irregularities in the mid and distal vessel.  Overall, the coronaries contain no critical stenoses. The ostial circumflex will need to be evaluated by surgery to determine whether or not grafting would be advisable.   RECOMMENDATION:  For treating team.

## 2014-07-17 NOTE — H&P (View-Only) (Signed)
SUBJECTIVE:  No complaints  OBJECTIVE:   Vitals:   Filed Vitals:   07/29/2014 0351 07/25/2014 0615 07/08/2014 0630 07/15/2014 0700  BP: 161/56 164/55 165/54 163/49  Pulse: 73     Temp: 98.2 F (36.8 C) 98.4 F (36.9 C) 98 F (36.7 C) 98.6 F (37 C)  TempSrc: Oral Oral Oral Oral  Resp: 19 18 21 18   Height:      Weight: 191 lb 5.8 oz (86.8 kg)     SpO2: 98% 97% 96% 95%   I&O's:   Intake/Output Summary (Last 24 hours) at 07/27/2014 0749 Last data filed at 07/05/2014 0700  Gross per 24 hour  Intake  727.5 ml  Output   1650 ml  Net -922.5 ml   TELEMETRY: Reviewed telemetry pt in NSR:     PHYSICAL EXAM General: Well developed, well nourished, in no acute distress Head: Eyes PERRLA, No xanthomas.   Normal cephalic and atramatic  Lungs:   Clear bilaterally to auscultation and percussion. Heart:   HRRR S1 S2 Pulses are 2+ & equal. Abdomen: Bowel sounds are positive, abdomen soft and non-tender without masses  Extremities:   2+ edema Neuro: Alert and oriented X 3. Psych:  Good affect, responds appropriately   LABS: Basic Metabolic Panel:  Recent Labs  07/16/14 2200 07/10/2014 0500  NA 139 141  K 4.3 3.7  CL 99 100  CO2 25 27  GLUCOSE 157* 105*  BUN 29* 26*  CREATININE 1.54* 1.47*  CALCIUM 8.1* 8.2*   Liver Function Tests:  Recent Labs  07/16/14 0357 07/16/2014 0500  AST 41* 40*  ALT 36 35  ALKPHOS 59 59  BILITOT 0.8 0.7  PROT 5.8* 5.9*  ALBUMIN 2.2* 2.5*   No results found for this basename: LIPASE, AMYLASE,  in the last 72 hours CBC:  Recent Labs  07/16/14 2200 07/20/2014 0500  WBC 8.2 8.2  HGB 10.9* 10.4*  HCT 34.6* 32.2*  MCV 103.9* 102.9*  PLT 41* 36*   Cardiac Enzymes: No results found for this basename: CKTOTAL, CKMB, CKMBINDEX, TROPONINI,  in the last 72 hours BNP: No components found with this basename: POCBNP,  D-Dimer: No results found for this basename: DDIMER,  in the last 72 hours Hemoglobin A1C: No results found for this basename:  HGBA1C,  in the last 72 hours Fasting Lipid Panel: No results found for this basename: CHOL, HDL, LDLCALC, TRIG, CHOLHDL, LDLDIRECT,  in the last 72 hours Thyroid Function Tests:  Recent Labs  07/30/2014 0500  TSH 4.750*   Anemia Panel: No results found for this basename: VITAMINB12, FOLATE, FERRITIN, TIBC, IRON, RETICCTPCT,  in the last 72 hours Coag Panel:   Lab Results  Component Value Date   INR 1.21 07/16/2014   INR 1.48 07/28/2014    RADIOLOGY: Ct Abdomen Pelvis Wo Contrast  07/09/2014   CLINICAL DATA:  Enterococcus bacteremia. Evaluate for abdominal source. Evaluate for potential tumor. Enterobacter is also present in the urine.  EXAM: CT ABDOMEN AND PELVIS WITHOUT CONTRAST  TECHNIQUE: Multidetector CT imaging of the abdomen and pelvis was performed following the standard protocol without IV contrast.  COMPARISON:  No priors.  FINDINGS: Lung Bases: Atherosclerotic calcifications in the right coronary artery. Pacemaker lead terminating in the right ventricular apex. Moderate-sized hiatal hernia. Moderate to large bilateral pleural effusions which appear to layer dependently and are associated with extensive passive atelectasis in the visualized portions of the lower lobes of the lungs bilaterally.  Abdomen/Pelvis: 9 mm low attenuation lesion in segment 2  of the liver is incompletely characterized on today's noncontrast CT examination, but is unchanged, and favored to represent a tiny cyst. No other focal hepatic lesions are identified on today's non contrast CT examination. There is a tiny calcifications in the head of the pancreas (image 31 of series 2) which appears to be immediately posterior to the distal common bile duct, potentially related to prior episodes of pancreatitis. Other small calcifications are also noted in the head and tail of the pancreas as well. The calcification in the head of the pancreas is not favored to be within the common bile duct, as the common bile duct is normal  in caliber measuring 4 mm, and there is no intrahepatic biliary ductal dilatation to suggest obstruction. The unenhanced appearance of the gallbladder, spleen and bilateral adrenal glands is unremarkable. There is bilateral perinephric stranding which is nonspecific. 1.5 cm intermediate attenuation lesion extending exophytically off the lateral aspect of the upper pole of the right kidney is incompletely characterized on today's non contrast CT examination, but is statistically likely to represent a mildly proteinaceous cyst.  Normal appendix. Trace volume of ascites. No pneumoperitoneum. No pathologic distention of small bowel. 10 mm short axis inguinal lymph nodes bilaterally are nonspecific. No other definite lymphadenopathy identified in the abdomen or pelvis on today's non contrast CT examination. Mild diffuse mesenteric edema. Prostate gland is enlarged measuring 4.9 x 6.1 cm. Urinary bladder is generally unremarkable in appearance, however, the attenuation values in the urine are rather high ranging from 45-55 HU, suggesting proteinaceous or hemorrhagic contents.  Musculoskeletal: Mild diffuse body wall edema. There are no aggressive appearing lytic or blastic lesions noted in the visualized portions of the skeleton.  IMPRESSION: 1. No definite source for bacteremia identified on today's examination. 2. Relatively high attenuation of the urine is of uncertain etiology and significance, but may suggest some proteinaceous or hemorrhagic contents. 3. Findings suggestive of anasarca, including moderate to large bilateral pleural effusions, trace volume of ascites, mild diffuse mesenteric edema and diffuse body wall edema. 4. Prostatomegaly. 5. Additional incidental findings, as above.   Electronically Signed   By: Vinnie Langton M.D.   On: 07/09/2014 12:46   Dg Orthopantogram  07/15/2014   CLINICAL DATA:  Endocarditis.  EXAM: ORTHOPANTOGRAM/PANORAMIC  COMPARISON:  None.  FINDINGS: There are multiple missing  teeth and numerous metallic restorations. However, there is no evidence of a periapical abscess or osteomyelitis or other acute abnormality.  IMPRESSION: No periapical abscesses or other acute abnormalities.   Electronically Signed   By: Rozetta Nunnery M.D.   On: 07/15/2014 21:43   Dg Chest 2 View  07/15/2014   CLINICAL DATA:  Pacemaker extraction  EXAM: CHEST  2 VIEW  COMPARISON:  07/07/2014  FINDINGS: Cardiac pacer has been removed with generator and 2 leads absent. Mild interstitial change in peribronchial cuffing. These findings are less prominent when compared to the prior study. There is consolidation in the medial left lung base which appears most consistent with atelectasis.  IMPRESSION: 1. Mild residual interstitial change may reflect minimal remaining interstitial pulmonary edema 2. Left lower lobe atelectasis   Electronically Signed   By: Skipper Cliche M.D.   On: 07/15/2014 08:15   Dg Chest 2 View  07/12/2014   CLINICAL DATA:  Fever, chills, shortness of breath.  EXAM: CHEST  2 VIEW  COMPARISON:  06/10/2014  FINDINGS: Left pacer in place with leads in the right atrium and right ventricle. Heart is normal size. Mild peribronchial thickening. No confluent  airspace opacities or effusions. No acute bony abnormality.  IMPRESSION: Bronchitic changes.   Electronically Signed   By: Rolm Baptise M.D.   On: 07/27/2014 15:07   Ct Head Wo Contrast  07/09/2014   CLINICAL DATA:  Hallucinations.  Thrombocytopenia  EXAM: CT HEAD WITHOUT CONTRAST  TECHNIQUE: Contiguous axial images were obtained from the base of the skull through the vertex without intravenous contrast.  COMPARISON:  None.  FINDINGS: Mild atrophy. Negative for hemorrhage. Negative for acute infarct or mass. Calvarium intact.  IMPRESSION: No acute abnormality.   Electronically Signed   By: Franchot Gallo M.D.   On: 07/09/2014 18:47   Ct Angio Chest W/cm &/or Wo Cm  07/04/2014   CLINICAL DATA:  Shortness of breath and fever  EXAM: CT ANGIOGRAPHY  CHEST WITH CONTRAST  TECHNIQUE: Multidetector CT imaging of the chest was performed using the standard protocol during bolus administration of intravenous contrast. Multiplanar CT image reconstructions and MIPs were obtained to evaluate the vascular anatomy.  CONTRAST:  70mL OMNIPAQUE IOHEXOL 350 MG/ML SOLN  COMPARISON:  Chest radiograph July 06, 2014  FINDINGS: There is no demonstrable pulmonary embolus. There is no thoracic aortic aneurysm or dissection.  There are free-flowing pleural effusions bilaterally. There is hazy ground-glass opacity diffusely.  On axial CT slice 55 series 6, there is a 6 x 4 mm nodular opacity in the medial segment of the right middle lobe. On axial slice 64 series 6, there is a 6 x 4 mm nodular opacity in the lateral segment of the left lower lobe. On axial slice 62 series 6, there is a 7 x 5 mm nodular opacity in the lateral segment of the right middle lobe. On axial slice 69 series 6, there is a 5 x 4 mm nodular opacity in the superior segment of the right lower lobe.  There is no appreciable thoracic adenopathy. Pericardium is not thickened. Pacemaker leads are attached to the right atrium and right ventricle. There is a focal hiatal hernia.  Visualized upper abdominal structures appear unremarkable. There are no blastic or lytic bone lesions. There is degenerative change in the thoracic spine. Thyroid appears unremarkable.  Review of the MIP images confirms the above findings.  IMPRESSION: No demonstrable pulmonary embolus.  Bilateral effusions with extensive ground-glass opacity bilaterally. Suspect alveolar edema with findings consistent with congestive heart failure. Atypical infection or allergic type phenomenon are differential considerations, however.  Several nodular opacities, largest measuring 7 mm. Followup of these nodular opacity should be based on Fleischner Society guidelines. If the patient is at high risk for bronchogenic carcinoma, follow-up chest CT at 3-54months  is recommended. If the patient is at low risk for bronchogenic carcinoma, follow-up chest CT at 6-12 months is recommended. This recommendation follows the consensus statement: Guidelines for Management of Small Pulmonary Nodules Detected on CT Scans: A Statement from the [facility] as published in Radiology 2005; 237:395-400.  Focal hiatal hernia.  No adenopathy.   Electronically Signed   By: Lowella Grip M.D.   On: 07/18/2014 18:09   Dg Chest Port 1 View  07/16/2014   CLINICAL DATA:  Line placement  EXAM: PORTABLE CHEST - 1 VIEW  COMPARISON:  None.  FINDINGS: Right-sided PICC line with the tip projecting over the SVC.  Small left pleural effusion. Trace right pleural effusion with right basilar airspace disease which may reflect atelectasis versus pneumonia. No pneumothorax.  Stable cardiomediastinal silhouette. Thoracic aortic atherosclerosis. Unremarkable osseous structures.  IMPRESSION: 1. Right-sided PICC line with the  tip projecting over the SVC. 2. Bilateral small pleural effusions. Right basilar airspace disease which may reflect atelectasis versus pneumonia.   Electronically Signed   By: Kathreen Devoid   On: 07/16/2014 16:00   Dg Chest Port 1 View  07/07/2014   CLINICAL DATA:  Evaluate airspace.  EXAM: PORTABLE CHEST - 1 VIEW  COMPARISON:  CT 08/01/2014.  Chest x-ray 07/24/2014 and 06/10/2014.  FINDINGS: Cardiac pacer with lead tips in right atrium and right ventricle. Mediastinum and hilar structures are unremarkable. Cardiomegaly with mild pulmonary vascular prominence and interstitial prominence consistent with congestive heart failure. Small pleural effusions. No pneumothorax. Reference is made to recent chest CT which demonstrated small pulmonary nodules. These are not definitely identified by chest x-ray. No acute bony abnormality.  IMPRESSION: 1. Persistent changes of congestive heart failure pulmonary edema. Small pleural effusions. 2. Reference is made to recent chest CT which  demonstrated small pulmonary nodules. Follow-up is recommended as on prior CT report.   Electronically Signed   By: Marcello Moores  Register   On: 07/07/2014 07:26   Assessment/Plan:  1 subacute bacterial endocarditis-enterococcus on initial blood cultures with most recent cultures negative. Patient afebrile. Continue ampicillin and gentamicin. Infectious disease following. Transesophageal echocardiogram showed aortic valve vegetation with moderate aortic insufficiency. There was also severe mitral regurgitation. Patient will need aortic valve replacement and either mitral valve replacement or mitral valve repair. His pacemaker has been removed. He will need cardiac catheterization prior to surgery. There will be increased risk with catheterization both for contrast nephropathy (baseline renal insuff and presently being treated with gentamicin) and most likely for CVA as well (proximity of catheter to aortic valve vegetations and risk of embolism). Patient understands risk and is agreeable to proceed. We will plan cath today. Lasix on hold for cath. Limit dye and no ventriculogram. Follow renal function closely following procedure. Hopefully can proceed with valve surgery early next week.  2 history of paroxysmal atrial fibrillation with post-termination pauses -anticoagulation is on hold given thrombocytopenia. Pacemaker has been removed. Telemetry yesterday showed atrial fibrillation but now back in NSR. Will require Pacemaker replacement once he recovers from valve surgery.  3 stage III chronic kidney disease-follow renal function closely given use of gentamicin and upcoming catheterization and valve surgery.  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 Hematology following and managing. He received platelet transfusion this am. 5 hypertension-given aortic insufficiency and mitral regurgitation need to have blood pressure well controlled. We will avoid beta blockade given history of bradycardia. Avoid ACE inhibitors given  renal insufficiency.  Increase Amlodipine to 7.5mg  daily.  6 chronic diastolic heart failure-patient is volume overloaded on examination. His heart failure is most likely from his mitral regurgitation and aortic insufficiency. Will restart Lasix after catheterization.    Sueanne Margarita, MD  07/06/2014  7:49 AM

## 2014-07-17 NOTE — Progress Notes (Signed)
[facility]                                                                              Patient Demographics  Justin [name], is a 80 y.o. male, DOB - 04/22/1933, BJY:[id number]  Admit date - 07/21/2014   Admitting Physician Rigoberto Noel, MD  Outpatient Primary MD for the patient is Penni Homans, MD  LOS - 11   Chief Complaint  Patient presents with  . Shortness of Breath      HPI on 07/27/2014 80 y.o. male with history of coronary artery disease status post stents, hypertension, dyslipidemia, atrial fibrillation no longer on anticoagulation secondary to recent GI bleed, pacemaker, ITP currently being treated with IVIG by Dr. Marin Olp (most recently 8/3) and prednisone 80mg  daily, presented to the emergency department with complaints of fever and shortness of breath that started 8/2. In ED, patient found to have leukocytosis, hypotension, and lactic acidosis. Given 1L IVF bolus and started on levophed. Transferred to MICU for suspected sepsis.   Interim history Patient was hypotensive and required pressors. She was found to have septic shock with enterococcus bacteremia, infectious disease was consulted. TEE was recommended which showed a large aortic valve vegetation. 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 Patient was noted to have an episode of TIA, and neurology was consulted. Patient's pacemaker was removed on 07/28/2014. Cardiothoracic surgery was also consulted for further evaluation. Patient had PICC placed on 07/16/14.  He will have a heart cath today.  Assessment & Plan   Enterococcus Bacteremia with Aortic Valve Vegetation:  -Received IV vancomycin for 4 days. Repeated Blood Culture 8-5 no growth to date. WBC trending down. -CT abdomen no mas.  -S/P TEE which showed Large Aortic Valve Vegetation.  -Has PICC line, double lumen. -Vancomycin was changed to ampicillin 8-8.  Gentamycin added on  8-9.  -HIV non reactive, viral hepatitis negative. .  -Cardiothoracic surgery also consulted, possible surgery on 07/22/2014 -Patient will also have heart cath, today 07/23/2014  Mental status/Transient Hallucination -Occurred on 07/09/2014, resolved -May have been secondary to medications versus delirium -CT of the head was negative  Transient hand weakness, numbness, likely TIA -Carotid Duplex: Bilateral: 1-39% ICA stenosis. Vertebral artery flow is antegrade. Right subclavian artery demonstrates significant stenosis compared to left subclavian artery. Medical management for subclavian artery stenosis.  -Hemoglobin A1c 5.6 -Continue with Lipitor. Follow LFT -TEE showed Aortic valve vegetation, treatment and plan as above -Neurology has signed off  Possible pneumonia vs TRALI from IVIG  -Bilateral effusions - stable   Diarrhea -Resolved.   Elevated troponin -in setting sepsis.  -ECHO normal Wall motion.  -Cardiology following, patient is high-risk for anticoagulation due to to GI bleed  Enterobacter Urine:  -30,000 colonies.   Chronic diastolic heart failure - on lasix at home  -Continue lasix (80 mg ) on 8-5.  -Dyspnea resolved -Cardiology following, and does not want to increase lasix at this time due to upcoming AV replacement and would like to maintain his renal function -Heart cath 07/16/2014 -Avoid ACEI due to AKI.  Severe Sepsis/Septic Shock -Secondary to Enterococcus bacteremia.   -Septic shock  resolved, patient did require use of pressors, Levophed -Continue antibiotics as stated above -Blood pressure currently stable  Acute kidney injury -in setting of infection. Monitor on Lasix. Good urine output.  -Continue to monitor creatinine  Thrombocytopenia,  -History of ITP, first IVIG treatment 8/3, on chronic prednisone.  -Platelets stable, 41 -Dr. Marin Olp following and appreciated -Received one dose of Nplate and received epogen on  8/8 -Given a unit of platelets today.  Will continue to monitor. -Patient will likely need additional units before surgery.  Suspected Adrenal insufficiency ( chronic steroids )  -Continue with prednisone.   History of paroxysmal atrial fibrillation  -Currently back in sinus.  -Pacemaker was removed, however per cardiology notes, patient will need placement of pacemaker post surgery.  -No anticoagulation at this time due to patient's thrombocytopenia.   Code Status: Full  Family Communication: Daughter at bedside  Disposition Plan: Admitted, pending further increase in platelets, cath on 07/16/2014, and AV replacement.  Time Spent in minutes   25 minutes  Procedures 6/24 TTE >>> LVEF 55%, Grade 2 DD  8/3 CT chest >>> bilateral effusions, bilateral GGO, several nodular opacities  8/4 ECHO >>> Mild to mod MR, EF 50-55%, G2 Dia Dys Fcn  TEE: normal LV function; severe MR; large aortic valve vegetation with moderate AI; no pacemaker vegetation identified 8/11 Pacemaker extraction 8/13 PICC line placed 8/14 Heart cath  Consults   Cardiology Cardiothoracic surgery Neurology Infectious disease Oncology  DVT Prophylaxis  SCDs  Lab Results  Component Value Date   PLT 36* 07/14/2014    Medications  Scheduled Meds: . sodium chloride   Intravenous Once  . amLODipine  7.5 mg Oral Daily  . ampicillin (OMNIPEN) IV  2 g Intravenous 4 times per day  . atorvastatin  10 mg Oral QPM  . chlorhexidine  15 mL Mouth/Throat BID  . feeding supplement (ENSURE COMPLETE)  237 mL Oral TID WC  . [START ON 07/18/2014] furosemide  80 mg Oral Daily  . gentamicin  100 mg Intravenous Q24H  . IMMUNE GLOBULIN 10% (HUMAN) IV - For Fluid Restriction Only  400 mg/kg Intravenous Q24H  . insulin aspart  0-9 Units Subcutaneous TID WC  . pantoprazole  40 mg Oral Daily  . predniSONE  40 mg Oral Q breakfast  . pyridOXINE  100 mg Oral QPM  . sodium chloride  10-40 mL Intracatheter Q12H  . sodium chloride   10-40 mL Intracatheter Q12H  . sodium chloride  3 mL Intravenous Q12H  . tamsulosin  0.4 mg Oral QPC supper  . vitamin E  400 Units Oral Daily   Continuous Infusions:  PRN Meds:.sodium chloride, sodium chloride, acetaminophen, ondansetron (ZOFRAN) IV, polyvinyl alcohol, sodium chloride, sodium chloride, sodium chloride  Antibiotics    Anti-infectives   Start     Dose/Rate Route Frequency Ordered Stop   07/16/14 1300  gentamicin (GARAMYCIN) IVPB 100 mg     100 mg 200 mL/hr over 30 Minutes Intravenous Every 24 hours 07/15/14 1917     07/17/2014 1045  gentamicin (GARAMYCIN) 80 mg in sodium chloride irrigation 0.9 % 500 mL irrigation  Status:  Discontinued     80 mg Irrigation On call 07/23/2014 1031 07/20/2014 1805   07/12/14 1300  gentamicin (GARAMYCIN) IVPB 80 mg  Status:  Discontinued     80 mg 100 mL/hr over 30 Minutes Intravenous Every 24 hours 07/12/14 1200 07/15/14 1917   07/11/14 1200  ampicillin (OMNIPEN) 2 g in sodium chloride 0.9 % 50 mL IVPB  2 g 150 mL/hr over 20 Minutes Intravenous 4 times per day 07/11/14 0945     07/07/14 0030  piperacillin-tazobactam (ZOSYN) IVPB 3.375 g  Status:  Discontinued     3.375 g 12.5 mL/hr over 240 Minutes Intravenous Every 8 hours 08/03/2014 2042 07/08/14 1602   07/26/2014 2300  levofloxacin (LEVAQUIN) IVPB 750 mg  Status:  Discontinued     750 mg 100 mL/hr over 90 Minutes Intravenous Every 48 hours 07/26/2014 2232 07/08/14 1022   07/18/2014 1615  piperacillin-tazobactam (ZOSYN) IVPB 3.375 g     3.375 g 100 mL/hr over 30 Minutes Intravenous  Once 07/11/2014 1604 08/02/2014 1755   08/02/2014 1615  vancomycin (VANCOCIN) IVPB 1000 mg/200 mL premix     1,000 mg 200 mL/hr over 60 Minutes Intravenous  Once 07/04/2014 1604 07/09/2014 1930   07/13/2014 0030  vancomycin (VANCOCIN) IVPB 750 mg/150 ml premix  Status:  Discontinued     750 mg 150 mL/hr over 60 Minutes Intravenous Every 12 hours 07/30/2014 2042 07/11/14 0945        Subjective:   Justin [name] seen and  examined today.  Patient has no complaints.  He is anxious about his heart cath today.  Denies chest pain, palpitations, shortness of breath.  Objective:   Filed Vitals:   08/02/2014 0615 07/08/2014 0630 07/09/2014 0700 07/13/2014 0755  BP: 164/55 165/54 163/49   Pulse:      Temp: 98.4 F (36.9 C) 98 F (36.7 C) 98.6 F (37 C) 98.4 F (36.9 C)  TempSrc: Oral Oral Oral Oral  Resp: 18 21 18    Height:      Weight:      SpO2: 97% 96% 95% 97%    Wt Readings from Last 3 Encounters:  08/03/2014 86.8 kg (191 lb 5.8 oz)  07/26/2014 86.8 kg (191 lb 5.8 oz)  07/05/2014 86.8 kg (191 lb 5.8 oz)     Intake/Output Summary (Last 24 hours) at 07/27/2014 9892 Last data filed at 07/17/14 0700  Gross per 24 hour  Intake  727.5 ml  Output   1450 ml  Net -722.5 ml    Exam  General: Well developed, well nourished, NAD, appears stated age  HEENT: NCAT, mucous membranes moist.   Cardiovascular: S1 S2 auscultated,Regular  Respiratory: Clear to ascultation bilaterally  Abdomen: Soft, nontender, nondistended, + bowel sounds  Extremities: warm dry without cyanosis clubbing. 2+ edema in LE B/L  Neuro: AAOx3, no focal deficits  Psych: Normal affect and demeanor with intact judgement and insight  Data Review   Micro Results Recent Results (from the past 240 hour(s))  CULTURE, BLOOD (ROUTINE X 2)     Status: None   Collection Time    07/08/14 12:00 PM      Result Value Ref Range Status   Specimen Description BLOOD RIGHT HAND   Final   Special Requests BOTTLES DRAWN AEROBIC ONLY [facility]   Final   Culture  Setup Time     Final   Value: 07/08/2014 17:14     Performed at [facility]   Culture     Final   Value: NO GROWTH 5 DAYS     Performed at [facility]   Report Status 07/17/2014 FINAL   Final  CULTURE, BLOOD (ROUTINE X 2)     Status: None   Collection Time    07/08/14 12:10 PM      Result Value Ref Range Status   Specimen Description BLOOD LEFT HAND   Final  Special Requests  BOTTLES DRAWN AEROBIC ONLY [facility]   Final   Culture  Setup Time     Final   Value: 07/08/2014 17:05     Performed at [facility]   Culture     Final   Value: NO GROWTH 5 DAYS     Performed at [facility]   Report Status 07/31/2014 FINAL   Final  FECAL OCCULT BLOOD, IMMUNOCHEMICAL     Status: None   Collection Time    07/13/14  5:24 PM      Result Value Ref Range Status   Fecal Occult Bld Negative  Negative Final  MRSA PCR SCREENING     Status: None   Collection Time    07/19/2014  6:17 PM      Result Value Ref Range Status   MRSA by PCR NEGATIVE  NEGATIVE Final   Comment:            The GeneXpert MRSA Assay (FDA     approved for NASAL specimens     only), is one component of a     comprehensive MRSA colonization     surveillance program. It is not     intended to diagnose MRSA     infection nor to guide or     monitor treatment for     MRSA infections.    Radiology Reports Ct Abdomen Pelvis Wo Contrast  07/09/2014   CLINICAL DATA:  Enterococcus bacteremia. Evaluate for abdominal source. Evaluate for potential tumor. Enterobacter is also present in the urine.  EXAM: CT ABDOMEN AND PELVIS WITHOUT CONTRAST  TECHNIQUE: Multidetector CT imaging of the abdomen and pelvis was performed following the standard protocol without IV contrast.  COMPARISON:  No priors.  FINDINGS: Lung Bases: Atherosclerotic calcifications in the right coronary artery. Pacemaker lead terminating in the right ventricular apex. Moderate-sized hiatal hernia. Moderate to large bilateral pleural effusions which appear to layer dependently and are associated with extensive passive atelectasis in the visualized portions of the lower lobes of the lungs bilaterally.  Abdomen/Pelvis: 9 mm low attenuation lesion in segment 2 of the liver is incompletely characterized on today's noncontrast CT examination, but is unchanged, and favored to represent a tiny cyst. No other focal hepatic lesions are identified on  today's non contrast CT examination. There is a tiny calcifications in the head of the pancreas (image 31 of series 2) which appears to be immediately posterior to the distal common bile duct, potentially related to prior episodes of pancreatitis. Other small calcifications are also noted in the head and tail of the pancreas as well. The calcification in the head of the pancreas is not favored to be within the common bile duct, as the common bile duct is normal in caliber measuring 4 mm, and there is no intrahepatic biliary ductal dilatation to suggest obstruction. The unenhanced appearance of the gallbladder, spleen and bilateral adrenal glands is unremarkable. There is bilateral perinephric stranding which is nonspecific. 1.5 cm intermediate attenuation lesion extending exophytically off the lateral aspect of the upper pole of the right kidney is incompletely characterized on today's non contrast CT examination, but is statistically likely to represent a mildly proteinaceous cyst.  Normal appendix. Trace volume of ascites. No pneumoperitoneum. No pathologic distention of small bowel. 10 mm short axis inguinal lymph nodes bilaterally are nonspecific. No other definite lymphadenopathy identified in the abdomen or pelvis on today's non contrast CT examination. Mild diffuse mesenteric edema. Prostate gland is enlarged measuring 4.9 x 6.1  cm. Urinary bladder is generally unremarkable in appearance, however, the attenuation values in the urine are rather high ranging from 45-55 HU, suggesting proteinaceous or hemorrhagic contents.  Musculoskeletal: Mild diffuse body wall edema. There are no aggressive appearing lytic or blastic lesions noted in the visualized portions of the skeleton.  IMPRESSION: 1. No definite source for bacteremia identified on today's examination. 2. Relatively high attenuation of the urine is of uncertain etiology and significance, but may suggest some proteinaceous or hemorrhagic contents. 3.  Findings suggestive of anasarca, including moderate to large bilateral pleural effusions, trace volume of ascites, mild diffuse mesenteric edema and diffuse body wall edema. 4. Prostatomegaly. 5. Additional incidental findings, as above.   Electronically Signed   By: Vinnie Langton M.D.   On: 07/09/2014 12:46   Dg Chest 2 View  07/15/2014   CLINICAL DATA:  Pacemaker extraction  EXAM: CHEST  2 VIEW  COMPARISON:  07/07/2014  FINDINGS: Cardiac pacer has been removed with generator and 2 leads absent. Mild interstitial change in peribronchial cuffing. These findings are less prominent when compared to the prior study. There is consolidation in the medial left lung base which appears most consistent with atelectasis.  IMPRESSION: 1. Mild residual interstitial change may reflect minimal remaining interstitial pulmonary edema 2. Left lower lobe atelectasis   Electronically Signed   By: Skipper Cliche M.D.   On: 07/15/2014 08:15   Dg Chest 2 View  08/03/2014   CLINICAL DATA:  Fever, chills, shortness of breath.  EXAM: CHEST  2 VIEW  COMPARISON:  06/10/2014  FINDINGS: Left pacer in place with leads in the right atrium and right ventricle. Heart is normal size. Mild peribronchial thickening. No confluent airspace opacities or effusions. No acute bony abnormality.  IMPRESSION: Bronchitic changes.   Electronically Signed   By: Rolm Baptise M.D.   On: 07/24/2014 15:07   Ct Head Wo Contrast  07/09/2014   CLINICAL DATA:  Hallucinations.  Thrombocytopenia  EXAM: CT HEAD WITHOUT CONTRAST  TECHNIQUE: Contiguous axial images were obtained from the base of the skull through the vertex without intravenous contrast.  COMPARISON:  None.  FINDINGS: Mild atrophy. Negative for hemorrhage. Negative for acute infarct or mass. Calvarium intact.  IMPRESSION: No acute abnormality.   Electronically Signed   By: Franchot Gallo M.D.   On: 07/09/2014 18:47   Ct Angio Chest W/cm &/or Wo Cm  07/18/2014   CLINICAL DATA:  Shortness of breath  and fever  EXAM: CT ANGIOGRAPHY CHEST WITH CONTRAST  TECHNIQUE: Multidetector CT imaging of the chest was performed using the standard protocol during bolus administration of intravenous contrast. Multiplanar CT image reconstructions and MIPs were obtained to evaluate the vascular anatomy.  CONTRAST:  32mL OMNIPAQUE IOHEXOL 350 MG/ML SOLN  COMPARISON:  Chest radiograph July 06, 2014  FINDINGS: There is no demonstrable pulmonary embolus. There is no thoracic aortic aneurysm or dissection.  There are free-flowing pleural effusions bilaterally. There is hazy ground-glass opacity diffusely.  On axial CT slice 55 series 6, there is a 6 x 4 mm nodular opacity in the medial segment of the right middle lobe. On axial slice 64 series 6, there is a 6 x 4 mm nodular opacity in the lateral segment of the left lower lobe. On axial slice 62 series 6, there is a 7 x 5 mm nodular opacity in the lateral segment of the right middle lobe. On axial slice 69 series 6, there is a 5 x 4 mm nodular opacity in the superior  segment of the right lower lobe.  There is no appreciable thoracic adenopathy. Pericardium is not thickened. Pacemaker leads are attached to the right atrium and right ventricle. There is a focal hiatal hernia.  Visualized upper abdominal structures appear unremarkable. There are no blastic or lytic bone lesions. There is degenerative change in the thoracic spine. Thyroid appears unremarkable.  Review of the MIP images confirms the above findings.  IMPRESSION: No demonstrable pulmonary embolus.  Bilateral effusions with extensive ground-glass opacity bilaterally. Suspect alveolar edema with findings consistent with congestive heart failure. Atypical infection or allergic type phenomenon are differential considerations, however.  Several nodular opacities, largest measuring 7 mm. Followup of these nodular opacity should be based on Fleischner Society guidelines. If the patient is at high risk for bronchogenic carcinoma,  follow-up chest CT at 3-29months is recommended. If the patient is at low risk for bronchogenic carcinoma, follow-up chest CT at 6-12 months is recommended. This recommendation follows the consensus statement: Guidelines for Management of Small Pulmonary Nodules Detected on CT Scans: A Statement from the [employer] as published in Radiology 2005; 237:395-400.  Focal hiatal hernia.  No adenopathy.   Electronically Signed   By: Lowella Grip M.D.   On: 07/23/2014 18:09   Dg Chest Port 1 View  07/07/2014   CLINICAL DATA:  Evaluate airspace.  EXAM: PORTABLE CHEST - 1 VIEW  COMPARISON:  CT 07/13/2014.  Chest x-ray 07/19/2014 and 06/10/2014.  FINDINGS: Cardiac pacer with lead tips in right atrium and right ventricle. Mediastinum and hilar structures are unremarkable. Cardiomegaly with mild pulmonary vascular prominence and interstitial prominence consistent with congestive heart failure. Small pleural effusions. No pneumothorax. Reference is made to recent chest CT which demonstrated small pulmonary nodules. These are not definitely identified by chest x-ray. No acute bony abnormality.  IMPRESSION: 1. Persistent changes of congestive heart failure pulmonary edema. Small pleural effusions. 2. Reference is made to recent chest CT which demonstrated small pulmonary nodules. Follow-up is recommended as on prior CT report.   Electronically Signed   By: Marcello Moores  Register   On: 07/07/2014 07:26    CBC  Recent Labs Lab 08/01/2014 0537 07/15/14 0317 07/16/14 0357 07/16/14 2200 07/28/2014 0500  WBC 11.4* 8.6 7.4 8.2 8.2  HGB 10.4* 10.1* 10.3* 10.9* 10.4*  HCT 32.2* 31.8* 32.1* 34.6* 32.2*  PLT 19* 41* 39* 41* 36*  MCV 101.6* 101.9* 102.6* 103.9* 102.9*  MCH 32.8 32.4 32.9 32.7 33.2  MCHC 32.3 31.8 32.1 31.5 32.3  RDW 21.6* 23.0* 23.6* 23.8* 23.9*    Chemistries   Recent Labs Lab 07/13/14 0415 07/25/2014 0537 07/15/14 0317 07/16/14 0357 07/16/14 2200 07/13/2014 0500  NA 142 142 141 143 139 141  K  3.5* 3.7 4.0 3.9 4.3 3.7  CL 104 104 103 104 99 100  CO2 24 23 24 27 25 27   GLUCOSE 109* 103* 106* 118* 157* 105*  BUN 35* 32* 31* 28* 29* 26*  CREATININE 1.50* 1.36* 1.41* 1.40* 1.54* 1.47*  CALCIUM 8.1* 8.2* 8.3* 8.2* 8.1* 8.2*  AST 71* 56* 50* 41*  --  40*  ALT 60* 54* 47 36  --  35  ALKPHOS 61 62 64 59  --  59  BILITOT 0.9 0.8 0.7 0.8  --  0.7   ------------------------------------------------------------------------------------------------------------------ estimated creatinine clearance is 43.4 ml/min (by C-G formula based on Cr of 1.47). ------------------------------------------------------------------------------------------------------------------ No results found for this basename: HGBA1C,  in the last 72 hours ------------------------------------------------------------------------------------------------------------------ No results found for this basename: CHOL, HDL, LDLCALC, TRIG,  CHOLHDL, LDLDIRECT,  in the last 72 hours ------------------------------------------------------------------------------------------------------------------  Recent Labs  07/19/2014 0500  TSH 4.750*   ------------------------------------------------------------------------------------------------------------------ No results found for this basename: VITAMINB12, FOLATE, FERRITIN, TIBC, IRON, RETICCTPCT,  in the last 72 hours  Coagulation profile  Recent Labs Lab 07/16/14 2200  INR 1.21    No results found for this basename: DDIMER,  in the last 72 hours  Cardiac Enzymes No results found for this basename: CK, CKMB, TROPONINI, MYOGLOBIN,  in the last 168 hours ------------------------------------------------------------------------------------------------------------------ No components found with this basename: POCBNP,     Joal Eakle D.O. on 07/29/2014 at 9:22 AM  Between 7am to 7pm - Pager - [phone]  After 7pm go to [email or web address] - password [email or web address]  And look for the  night coverage person covering for me after hours  [facility] Office  [phone]

## 2014-07-17 NOTE — Progress Notes (Signed)
Justin [name] is still doing well. It sounds like he will have his cardiac surgery next week.  He is going for a cardiac cath this morning. He has platelets going right now.  His prednisone is now down to 40 mg. This is to help with any healing from his surgery next week.  I will give him IVIG. Hopefully this might help with his a platelet count and getting his reticulocyte count of a low but more.  He has a PICC line in now. There was no problems with this.  He's eating well. He is ambulating. He has had no fever.  There are no labs back on him yet. His platelet count yesterday was 41,000. His hemoglobin was 10.9.  There is no change in his physical exam. His vital signs show temperature of 98. Blood pressure 165/54. Pulse is 73. Head and neck exam shows no ocular or oral lesion. He has no palpable cervical or supraclavicular lymph nodes. Lungs are clear. Cardiac exam regular rate and rhythm. He does have an occasional extra beat. Has a 1/6 systolic murmur. Abdomen is soft. Has good bowel sounds. There is no fluid wave. There is no palpable liver or spleen tip. Extremities shows no clubbing cyanosis or edema.  We will go ahead and give him some IVIG. I may give this to him for the next 3 days. He should be able to handle the volume that would put the IVIG in.  I do not think that it should be any problems with bleeding with his cardiac cath. The platelets should work well.  We will continue to follow him along.  Stormy Card 3:22-23

## 2014-07-18 DIAGNOSIS — D649 Anemia, unspecified: Secondary | ICD-10-CM

## 2014-07-18 DIAGNOSIS — R944 Abnormal results of kidney function studies: Secondary | ICD-10-CM

## 2014-07-18 DIAGNOSIS — I080 Rheumatic disorders of both mitral and aortic valves: Secondary | ICD-10-CM

## 2014-07-18 DIAGNOSIS — I2510 Atherosclerotic heart disease of native coronary artery without angina pectoris: Secondary | ICD-10-CM

## 2014-07-18 DIAGNOSIS — I5033 Acute on chronic diastolic (congestive) heart failure: Secondary | ICD-10-CM

## 2014-07-18 DIAGNOSIS — E8809 Other disorders of plasma-protein metabolism, not elsewhere classified: Secondary | ICD-10-CM

## 2014-07-18 LAB — GLUCOSE, CAPILLARY
GLUCOSE-CAPILLARY: 173 mg/dL — AB (ref 70–99)
Glucose-Capillary: 88 mg/dL (ref 70–99)
Glucose-Capillary: 158 mg/dL — ABNORMAL HIGH (ref 70–99)
Glucose-Capillary: 206 mg/dL — ABNORMAL HIGH (ref 70–99)

## 2014-07-18 LAB — COMPREHENSIVE METABOLIC PANEL
ALT: 34 U/L (ref 0–53)
AST: 41 U/L — AB (ref 0–37)
Albumin: 2.3 g/dL — ABNORMAL LOW (ref 3.5–5.2)
Alkaline Phosphatase: 62 U/L (ref 39–117)
Anion gap: 11 (ref 5–15)
BUN: 23 mg/dL (ref 6–23)
CO2: 27 meq/L (ref 19–32)
Calcium: 8.4 mg/dL (ref 8.4–10.5)
Chloride: 101 mEq/L (ref 96–112)
Creatinine, Ser: 1.45 mg/dL — ABNORMAL HIGH (ref 0.50–1.35)
GFR calc Af Amer: 51 mL/min — ABNORMAL LOW (ref >90)
GFR calc non Af Amer: 44 mL/min — ABNORMAL LOW (ref >90)
Glucose, Bld: 96 mg/dL (ref 70–99)
Potassium: 4.1 mEq/L (ref 3.7–5.3)
SODIUM: 139 meq/L (ref 137–147)
TOTAL PROTEIN: 6.3 g/dL (ref 6.0–8.3)
Total Bilirubin: 0.6 mg/dL (ref 0.3–1.2)

## 2014-07-18 LAB — LACTATE DEHYDROGENASE: LDH: 562 U/L — ABNORMAL HIGH (ref 94–250)

## 2014-07-18 LAB — CBC
HCT: 31.8 % — ABNORMAL LOW (ref 39.0–52.0)
Hemoglobin: 10 g/dL — ABNORMAL LOW (ref 13.0–17.0)
MCH: 33.1 pg (ref 26.0–34.0)
MCHC: 31.4 g/dL (ref 30.0–36.0)
MCV: 105.3 fL — ABNORMAL HIGH (ref 78.0–100.0)
PLATELETS: 54 10*3/uL — AB (ref 150–400)
RBC: 3.02 MIL/uL — AB (ref 4.22–5.81)
RDW: 24.3 % — ABNORMAL HIGH (ref 11.5–15.5)
WBC: 7.8 10*3/uL (ref 4.0–10.5)

## 2014-07-18 LAB — PREPARE PLATELET PHERESIS
Unit division: 0
Unit division: 0

## 2014-07-18 NOTE — Progress Notes (Signed)
Justin [name]   DOB:11/11/1933   MR#:[id number]   CSN#:[id number]  Subjective: wife Delores is at his bedside. He denies any bleeding or chest pain.  He tolerates his meals.  He states that his surgery is planned for Wednesday.   Objective:  Filed Vitals:   07/18/14 0915  BP: 148/44  Pulse:   Temp:   Resp: 25    Body mass index is 27.07 kg/(m^2).  Intake/Output Summary (Last 24 hours) at 07/18/14 1111 Last data filed at 07/18/14 0849  Gross per 24 hour  Intake   1335 ml  Output    850 ml  Net    485 ml    Sitting in chair.  R Picc in place  Sclerae unicteric  Oropharynx clear  Lungs clear -- no rales or rhonchi  Heart regular rate and rhythm; + SEM murmur  Abdomen benign  MSK 2+ peripheral edema  Neuro nonfocal   CBG (last 3)   Recent Labs  07/21/2014 1229 07/04/2014 1609 08/01/2014 2133  GLUCAP 131* 215* 138*     Labs:  Lab Results  Component Value Date   WBC 7.8 07/18/2014   HGB 10.0* 07/18/2014   HCT 31.8* 07/18/2014   MCV 105.3* 07/18/2014   PLT 54* 07/18/2014   NEUTROABS 18.0* 07/08/2014    Basic Metabolic Panel:  Recent Labs Lab 07/15/14 0317 07/16/14 0357 07/16/14 2200 07/30/2014 0500 07/18/14 0500  NA 141 143 139 141 139  K 4.0 3.9 4.3 3.7 4.1  CL 103 104 99 100 101  CO2 24 27 25 27 27  GLUCOSE 106* 118* 157* 105* 96  BUN 31* 28* 29* 26* 23  CREATININE 1.41* 1.40* 1.54* 1.47* 1.45*  CALCIUM 8.3* 8.2* 8.1* 8.2* 8.4   GFR Estimated Creatinine Clearance: 44 ml/min (by C-G formula based on Cr of 1.45). Liver Function Tests:  Recent Labs Lab 07/06/2014 0537 07/15/14 0317 07/16/14 0357 07/12/2014 0500 07/18/14 0500  AST 56* 50* 41* 40* 41*  ALT 54* 47 36 35 34  ALKPHOS 62 64 59 59 62  BILITOT 0.8 0.7 0.8 0.7 0.6  PROT 6.0 6.0 5.8* 5.9* 6.3  ALBUMIN 2.3* 2.4* 2.2* 2.5* 2.3*   No results found for this basename: LIPASE, AMYLASE,  in the last 168 hours No results found for this basename: AMMONIA,  in the last 168 hours Coagulation profile  Recent  Labs Lab 07/16/14 2200  INR 1.21    CBC:  Recent Labs Lab 07/15/14 0317 07/16/14 0357 07/16/14 2200 07/29/2014 0500 07/18/14 0500  WBC 8.6 7.4 8.2 8.2 7.8  HGB 10.1* 10.3* 10.9* 10.4* 10.0*  HCT 31.8* 32.1* 34.6* 32.2* 31.8*  MCV 101.9* 102.6* 103.9* 102.9* 105.3*  PLT 41* 39* 41* 36* 54*   Cardiac Enzymes: No results found for this basename: CKTOTAL, CKMB, CKMBINDEX, TROPONINI,  in the last 168 hours BNP: No components found with this basename: POCBNP,  CBG:  Recent Labs Lab 07/16/14 2135 07/22/2014 0756 07/31/2014 1229 07/29/2014 1609 07/30/2014 2133  GLUCAP 168* 121* 131* 215* 138*   D-Dimer No results found for this basename: DDIMER,  in the last 72 hours Hgb A1c  Recent Labs  07/16/2014 0500  HGBA1C 5.1   Lipid Profile No results found for this basename: CHOL, HDL, LDLCALC, TRIG, CHOLHDL, LDLDIRECT,  in the last 72 hours Thyroid function studies  Recent Labs  07/06/2014 0500  TSH 4.750*   Anemia work up No results found for this basename: VITAMINB12, FOLATE, FERRITIN, TIBC, IRON, RETICCTPCT,  in the last 72   hours Microbiology Recent Results (from the past 240 hour(s))  CULTURE, BLOOD (ROUTINE X 2)     Status: None   Collection Time    07/08/14 12:00 PM      Result Value Ref Range Status   Specimen Description BLOOD RIGHT HAND   Final   Special Requests BOTTLES DRAWN AEROBIC ONLY 6CC   Final   Culture  Setup Time     Final   Value: 07/08/2014 17:14     Performed at [facility]   Culture     Final   Value: NO GROWTH 5 DAYS     Performed at [facility]   Report Status 08/03/2014 FINAL   Final  CULTURE, BLOOD (ROUTINE X 2)     Status: None   Collection Time    07/08/14 12:10 PM      Result Value Ref Range Status   Specimen Description BLOOD LEFT HAND   Final   Special Requests BOTTLES DRAWN AEROBIC ONLY 6CC   Final   Culture  Setup Time     Final   Value: 07/08/2014 17:05     Performed at [facility]   Culture     Final    Value: NO GROWTH 5 DAYS     Performed at [facility]   Report Status 07/13/2014 FINAL   Final  FECAL OCCULT BLOOD, IMMUNOCHEMICAL     Status: None   Collection Time    07/13/14  5:24 PM      Result Value Ref Range Status   Fecal Occult Bld Negative  Negative Final  MRSA PCR SCREENING     Status: None   Collection Time    07/15/2014  6:17 PM      Result Value Ref Range Status   MRSA by PCR NEGATIVE  NEGATIVE Final   Comment:            The GeneXpert MRSA Assay (FDA     approved for NASAL specimens     only), is one component of a     comprehensive MRSA colonization     surveillance program. It is not     intended to diagnose MRSA     infection nor to guide or     monitor treatment for     MRSA infections.      Studies:  Dg Chest Port 1 View  07/16/2014   CLINICAL DATA:  Line placement  EXAM: PORTABLE CHEST - 1 VIEW  COMPARISON:  None.  FINDINGS: Right-sided PICC line with the tip projecting over the SVC.  Small left pleural effusion. Trace right pleural effusion with right basilar airspace disease which may reflect atelectasis versus pneumonia. No pneumothorax.  Stable cardiomediastinal silhouette. Thoracic aortic atherosclerosis. Unremarkable osseous structures.  IMPRESSION: 1. Right-sided PICC line with the tip projecting over the SVC. 2. Bilateral small pleural effusions. Right basilar airspace disease which may reflect atelectasis versus pneumonia.   Electronically Signed   By: Hetal  Patel   On: 07/16/2014 16:00    Assessment/Plan: 81 y.o.  1. 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  --On ampicillin and gentamicin per ID. --S/p extraction of dual chamber pacing on 07/30/2014  2. Thrombocytopenia - likely immune induced, improving.  --Plts 54 up from 36. Goal 60- 70 prior to procedure. Tapering prednisone to 40 mg daily (started  prednisone 80 mg on 07/31) to avoid delayed wound healing .    S/p 1 doses IVIG on 08/14, continue on 08/15 and 08/16.  Received N-plate on 24/82.  --s/p bone marrow biopsy with slight hypercellular without dyspoiesis; adequate megakaryocytes; iron studies were low.   3. Anemia NOS. -- S/p IV iron on 07/31. Hemoglobin is 10.0. MCV is 105.3.  On vitamin B-6 100 mg daily.   4. History of PPM/ CAD/ Atrial Fibrillation.   --He is off Eliquis secondary to #2 and history of GIB --S/p PPM removal on 08/11.  5. Enterobacter in urine --On ampicillin 2 gram every 6 hours and gentamicin 100 mg daily  6. CHF with lower extremity edema. --On lasix 80 mg daily. Daily weight, low salts  7. Elevated creatinine. --Creatinine of 1.45 with creatinine clearance of 44 mL/min.  Likely mixed etiology.  Avoid nephrotoxins.   8. Steroid prophylaxis --On Protonix 40 mg daily.   9. Hypoalbuminemia --Albumin 2.3.  Nutrition to help with healing as well.   10. Disposition. Full Code.   Dennisha Mouser, MD 07/18/2014  11:11 AM

## 2014-07-18 NOTE — Progress Notes (Signed)
CARDIAC REHAB PHASE I   PRE:  Rate/Rhythm: 86 SR with PVC  BP:  Sitting: 170/48     SaO2: 96 RA  MODE:  Ambulation: 570 ft   POST:  Rate/Rhythm: 84 SR with PVC  BP:  Sitting: 155/65  Pt walked 570 ft with no assist and RW.  Pt tolerated walk well with no c/o.  Returned pt to recliner with call button in reach.  No questions at this time.  [id number]  Lillia Dallas MS, ACSM RCEP 10:49 AM 07/18/2014

## 2014-07-18 NOTE — Progress Notes (Addendum)
SUBJECTIVE:  No complaints  OBJECTIVE:   Vitals:   Filed Vitals:   07/18/14 0400 07/18/14 0500 07/18/14 0600 07/18/14 0737  BP: 155/51  140/30 159/51  Pulse:    71  Temp:    97.9 F (36.6 C)  TempSrc:    Oral  Resp: 19 17 15 17   Height:      Weight:      SpO2:    92%   I&O's:   Intake/Output Summary (Last 24 hours) at 07/18/14 0834 Last data filed at 07/18/14 0600  Gross per 24 hour  Intake   1247 ml  Output   1150 ml  Net     97 ml   TELEMETRY: Reviewed telemetry pt in NSR:     PHYSICAL EXAM General: Well developed, well nourished, in no acute distress Head: Eyes PERRLA, No xanthomas.   Normal cephalic and atramatic  Lungs:   Clear bilaterally to auscultation and percussion. Heart:   HRRR S1 S2 Pulses are 2+ & equal. Abdomen: Bowel sounds are positive, abdomen soft and non-tender without masses  Extremities:   No clubbing, cyanosis or edema.  DP +1 Neuro: Alert and oriented X 3. Psych:  Good affect, responds appropriately   LABS: Basic Metabolic Panel:  Recent Labs  07/05/2014 0500 07/18/14 0500  NA 141 139  K 3.7 4.1  CL 100 101  CO2 27 27  GLUCOSE 105* 96  BUN 26* 23  CREATININE 1.47* 1.45*  CALCIUM 8.2* 8.4   Liver Function Tests:  Recent Labs  07/11/2014 0500 07/18/14 0500  AST 40* 41*  ALT 35 34  ALKPHOS 59 62  BILITOT 0.7 0.6  PROT 5.9* 6.3  ALBUMIN 2.5* 2.3*   No results found for this basename: LIPASE, AMYLASE,  in the last 72 hours CBC:  Recent Labs  07/20/2014 0500 07/18/14 0500  WBC 8.2 7.8  HGB 10.4* 10.0*  HCT 32.2* 31.8*  MCV 102.9* 105.3*  PLT 36* 54*   Cardiac Enzymes: No results found for this basename: CKTOTAL, CKMB, CKMBINDEX, TROPONINI,  in the last 72 hours BNP: No components found with this basename: POCBNP,  D-Dimer: No results found for this basename: DDIMER,  in the last 72 hours Hemoglobin A1C:  Recent Labs  07/23/2014 0500  HGBA1C 5.1   Fasting Lipid Panel: No results found for this basename: CHOL,  HDL, LDLCALC, TRIG, CHOLHDL, LDLDIRECT,  in the last 72 hours Thyroid Function Tests:  Recent Labs  07/29/2014 0500  TSH 4.750*   Anemia Panel: No results found for this basename: VITAMINB12, FOLATE, FERRITIN, TIBC, IRON, RETICCTPCT,  in the last 72 hours Coag Panel:   Lab Results  Component Value Date   INR 1.21 07/16/2014   INR 1.48 08/03/2014    RADIOLOGY: Ct Abdomen Pelvis Wo Contrast  07/09/2014   CLINICAL DATA:  Enterococcus bacteremia. Evaluate for abdominal source. Evaluate for potential tumor. Enterobacter is also present in the urine.  EXAM: CT ABDOMEN AND PELVIS WITHOUT CONTRAST  TECHNIQUE: Multidetector CT imaging of the abdomen and pelvis was performed following the standard protocol without IV contrast.  COMPARISON:  No priors.  FINDINGS: Lung Bases: Atherosclerotic calcifications in the right coronary artery. Pacemaker lead terminating in the right ventricular apex. Moderate-sized hiatal hernia. Moderate to large bilateral pleural effusions which appear to layer dependently and are associated with extensive passive atelectasis in the visualized portions of the lower lobes of the lungs bilaterally.  Abdomen/Pelvis: 9 mm low attenuation lesion in segment 2 of the liver is incompletely  characterized on today's noncontrast CT examination, but is unchanged, and favored to represent a tiny cyst. No other focal hepatic lesions are identified on today's non contrast CT examination. There is a tiny calcifications in the head of the pancreas (image 31 of series 2) which appears to be immediately posterior to the distal common bile duct, potentially related to prior episodes of pancreatitis. Other small calcifications are also noted in the head and tail of the pancreas as well. The calcification in the head of the pancreas is not favored to be within the common bile duct, as the common bile duct is normal in caliber measuring 4 mm, and there is no intrahepatic biliary ductal dilatation to suggest  obstruction. The unenhanced appearance of the gallbladder, spleen and bilateral adrenal glands is unremarkable. There is bilateral perinephric stranding which is nonspecific. 1.5 cm intermediate attenuation lesion extending exophytically off the lateral aspect of the upper pole of the right kidney is incompletely characterized on today's non contrast CT examination, but is statistically likely to represent a mildly proteinaceous cyst.  Normal appendix. Trace volume of ascites. No pneumoperitoneum. No pathologic distention of small bowel. 10 mm short axis inguinal lymph nodes bilaterally are nonspecific. No other definite lymphadenopathy identified in the abdomen or pelvis on today's non contrast CT examination. Mild diffuse mesenteric edema. Prostate gland is enlarged measuring 4.9 x 6.1 cm. Urinary bladder is generally unremarkable in appearance, however, the attenuation values in the urine are rather high ranging from 45-55 HU, suggesting proteinaceous or hemorrhagic contents.  Musculoskeletal: Mild diffuse body wall edema. There are no aggressive appearing lytic or blastic lesions noted in the visualized portions of the skeleton.  IMPRESSION: 1. No definite source for bacteremia identified on today's examination. 2. Relatively high attenuation of the urine is of uncertain etiology and significance, but may suggest some proteinaceous or hemorrhagic contents. 3. Findings suggestive of anasarca, including moderate to large bilateral pleural effusions, trace volume of ascites, mild diffuse mesenteric edema and diffuse body wall edema. 4. Prostatomegaly. 5. Additional incidental findings, as above.   Electronically Signed   By: Vinnie Langton M.D.   On: 07/09/2014 12:46   Dg Orthopantogram  07/15/2014   CLINICAL DATA:  Endocarditis.  EXAM: ORTHOPANTOGRAM/PANORAMIC  COMPARISON:  None.  FINDINGS: There are multiple missing teeth and numerous metallic restorations. However, there is no evidence of a periapical  abscess or osteomyelitis or other acute abnormality.  IMPRESSION: No periapical abscesses or other acute abnormalities.   Electronically Signed   By: Rozetta Nunnery M.D.   On: 07/15/2014 21:43   Dg Chest 2 View  07/15/2014   CLINICAL DATA:  Pacemaker extraction  EXAM: CHEST  2 VIEW  COMPARISON:  07/07/2014  FINDINGS: Cardiac pacer has been removed with generator and 2 leads absent. Mild interstitial change in peribronchial cuffing. These findings are less prominent when compared to the prior study. There is consolidation in the medial left lung base which appears most consistent with atelectasis.  IMPRESSION: 1. Mild residual interstitial change may reflect minimal remaining interstitial pulmonary edema 2. Left lower lobe atelectasis   Electronically Signed   By: Skipper Cliche M.D.   On: 07/15/2014 08:15   Dg Chest 2 View  07/04/2014   CLINICAL DATA:  Fever, chills, shortness of breath.  EXAM: CHEST  2 VIEW  COMPARISON:  06/10/2014  FINDINGS: Left pacer in place with leads in the right atrium and right ventricle. Heart is normal size. Mild peribronchial thickening. No confluent airspace opacities or effusions. No  acute bony abnormality.  IMPRESSION: Bronchitic changes.   Electronically Signed   By: Rolm Baptise M.D.   On: 07/17/2014 15:07   Ct Head Wo Contrast  07/09/2014   CLINICAL DATA:  Hallucinations.  Thrombocytopenia  EXAM: CT HEAD WITHOUT CONTRAST  TECHNIQUE: Contiguous axial images were obtained from the base of the skull through the vertex without intravenous contrast.  COMPARISON:  None.  FINDINGS: Mild atrophy. Negative for hemorrhage. Negative for acute infarct or mass. Calvarium intact.  IMPRESSION: No acute abnormality.   Electronically Signed   By: Franchot Gallo M.D.   On: 07/09/2014 18:47   Ct Angio Chest W/cm &/or Wo Cm  07/11/2014   CLINICAL DATA:  Shortness of breath and fever  EXAM: CT ANGIOGRAPHY CHEST WITH CONTRAST  TECHNIQUE: Multidetector CT imaging of the chest was performed using  the standard protocol during bolus administration of intravenous contrast. Multiplanar CT image reconstructions and MIPs were obtained to evaluate the vascular anatomy.  CONTRAST:  49mL OMNIPAQUE IOHEXOL 350 MG/ML SOLN  COMPARISON:  Chest radiograph July 06, 2014  FINDINGS: There is no demonstrable pulmonary embolus. There is no thoracic aortic aneurysm or dissection.  There are free-flowing pleural effusions bilaterally. There is hazy ground-glass opacity diffusely.  On axial CT slice 55 series 6, there is a 6 x 4 mm nodular opacity in the medial segment of the right middle lobe. On axial slice 64 series 6, there is a 6 x 4 mm nodular opacity in the lateral segment of the left lower lobe. On axial slice 62 series 6, there is a 7 x 5 mm nodular opacity in the lateral segment of the right middle lobe. On axial slice 69 series 6, there is a 5 x 4 mm nodular opacity in the superior segment of the right lower lobe.  There is no appreciable thoracic adenopathy. Pericardium is not thickened. Pacemaker leads are attached to the right atrium and right ventricle. There is a focal hiatal hernia.  Visualized upper abdominal structures appear unremarkable. There are no blastic or lytic bone lesions. There is degenerative change in the thoracic spine. Thyroid appears unremarkable.  Review of the MIP images confirms the above findings.  IMPRESSION: No demonstrable pulmonary embolus.  Bilateral effusions with extensive ground-glass opacity bilaterally. Suspect alveolar edema with findings consistent with congestive heart failure. Atypical infection or allergic type phenomenon are differential considerations, however.  Several nodular opacities, largest measuring 7 mm. Followup of these nodular opacity should be based on Fleischner Society guidelines. If the patient is at high risk for bronchogenic carcinoma, follow-up chest CT at 3-60months is recommended. If the patient is at low risk for bronchogenic carcinoma, follow-up chest  CT at 6-12 months is recommended. This recommendation follows the consensus statement: Guidelines for Management of Small Pulmonary Nodules Detected on CT Scans: A Statement from the [employer] as published in Radiology 2005; 237:395-400.  Focal hiatal hernia.  No adenopathy.   Electronically Signed   By: Lowella Grip M.D.   On: 08/01/2014 18:09   Dg Chest Port 1 View  07/16/2014   CLINICAL DATA:  Line placement  EXAM: PORTABLE CHEST - 1 VIEW  COMPARISON:  None.  FINDINGS: Right-sided PICC line with the tip projecting over the SVC.  Small left pleural effusion. Trace right pleural effusion with right basilar airspace disease which may reflect atelectasis versus pneumonia. No pneumothorax.  Stable cardiomediastinal silhouette. Thoracic aortic atherosclerosis. Unremarkable osseous structures.  IMPRESSION: 1. Right-sided PICC line with the tip projecting over the SVC.  2. Bilateral small pleural effusions. Right basilar airspace disease which may reflect atelectasis versus pneumonia.   Electronically Signed   By: Kathreen Devoid   On: 07/16/2014 16:00   Dg Chest Port 1 View  07/07/2014   CLINICAL DATA:  Evaluate airspace.  EXAM: PORTABLE CHEST - 1 VIEW  COMPARISON:  CT 07/10/2014.  Chest x-ray 08/01/2014 and 06/10/2014.  FINDINGS: Cardiac pacer with lead tips in right atrium and right ventricle. Mediastinum and hilar structures are unremarkable. Cardiomegaly with mild pulmonary vascular prominence and interstitial prominence consistent with congestive heart failure. Small pleural effusions. No pneumothorax. Reference is made to recent chest CT which demonstrated small pulmonary nodules. These are not definitely identified by chest x-ray. No acute bony abnormality.  IMPRESSION: 1. Persistent changes of congestive heart failure pulmonary edema. Small pleural effusions. 2. Reference is made to recent chest CT which demonstrated small pulmonary nodules. Follow-up is recommended as on prior CT report.    Electronically Signed   By: Marcello Moores  Register   On: 07/07/2014 07:26   Assessment/Plan:  1 subacute bacterial endocarditis-enterococcus on initial blood cultures with most recent cultures negative. Patient afebrile. Continue ampicillin and gentamicin. Infectious disease following. Transesophageal echocardiogram showed aortic valve vegetation with moderate aortic insufficiency. There was also severe mitral regurgitation. Patient will need aortic valve replacement and either mitral valve replacement or mitral valve repair. His pacemaker has been removed. Cardiac catheterization yesterday showed nonobstructive distal LM, widely patent LAD and stent in mid vessel, severely disease small to moderate sized diagonal, heavily calcified ostial circ with 70% stenosis, 50-70% stenosis of a small OM2, widely patent RCA with moderate irregularities.  2 history of paroxysmal atrial fibrillation with post-termination pauses -anticoagulation is on hold given thrombocytopenia. Pacemaker has been removed. Telemetry has shown some brief episodes of paroxysmal atrial fibrillation but now back in NSR. Will require Pacemaker replacement once he recovers from valve surgery. No anticoagulation at present due to thrombocytopenia. 3 stage III chronic kidney disease-follow renal function closely given use of gentamicin.  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 Hematology following and managing. Plt count up to 54K 5 hypertension-given aortic insufficiency and mitral regurgitation need to have blood pressure well controlled. We will avoid beta blockade given history of bradycardia. Avoid ACE inhibitors given renal insufficiency. Continue  Amlodipine. 6 chronic diastolic heart failure-patient is volume overloaded on examination. His heart failure is most likely from his mitral regurgitation and aortic insufficiency. PO Lasix to start this am.   Justin Margarita, MD  07/18/2014  8:34 AM

## 2014-07-18 NOTE — Progress Notes (Signed)
[facility]                                                                              Patient Demographics  Justin [name], is a 80 y.o. male, DOB - 03-Jun-1933, OJJ:[id number]  Admit date - 07/07/2014   Admitting Physician Rigoberto Noel, MD  Outpatient Primary MD for the patient is Penni Homans, MD  LOS - 12   Chief Complaint  Patient presents with  . Shortness of Breath      HPI on 07/04/2014 80 y.o. male with history of coronary artery disease status post stents, hypertension, dyslipidemia, atrial fibrillation no longer on anticoagulation secondary to recent GI bleed, pacemaker, ITP currently being treated with IVIG by Dr. Marin Olp (most recently 8/3) and prednisone 80mg  daily, presented to the emergency department with complaints of fever and shortness of breath that started 8/2. In ED, patient found to have leukocytosis, hypotension, and lactic acidosis. Given 1L IVF bolus and started on levophed. Transferred to MICU for suspected sepsis.   Interim history Patient was hypotensive and required pressors. She was found to have septic shock with enterococcus bacteremia, infectious disease was consulted. TEE was recommended which showed a large aortic valve vegetation. 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 Patient was noted to have an episode of TIA, and neurology was consulted. Patient's pacemaker was removed on 07/11/2014. Cardiothoracic surgery was also consulted for further evaluation. Patient had PICC placed on 07/16/14.  He will have a heart cath 07/21/2014.  Assessment & Plan   Enterococcus Bacteremia with Aortic Valve Vegetation:  -Received IV vancomycin for 4 days. Repeated Blood Culture 8-5 no growth to date. WBC trending down. -CT abdomen no mas.  -S/P TEE which showed Large Aortic Valve Vegetation.  -Has PICC line, double lumen. -Vancomycin was changed to ampicillin 8-8.  Gentamycin added on  8-9.  -HIV non reactive, viral hepatitis negative. .  -Cardiothoracic surgery also consulted, possible surgery on 07/22/2014 -Heart cath on 07/20/2014 showed: nonobstructive distal LM, widely patent LAD and stent in mid vessel, severely disease small to moderate sized diagonal, heavily calcified ostial circ with 70% stenosis, 50-70% stenosis of a small OM2, widely patent RCA with moderate irregularities.   Mental status/Transient Hallucination -Occurred on 07/09/2014, resolved -May have been secondary to medications versus delirium -CT of the head was negative  Transient hand weakness, numbness, likely TIA -Carotid Duplex: Bilateral: 1-39% ICA stenosis. Vertebral artery flow is antegrade. Right subclavian artery demonstrates significant stenosis compared to left subclavian artery. Medical management for subclavian artery stenosis.  -Hemoglobin A1c 5.6 -Continue with Lipitor. Follow LFT -TEE showed Aortic valve vegetation, treatment and plan as above -Neurology has signed off  Possible pneumonia vs TRALI from IVIG  -Bilateral effusions - stable   Diarrhea -Resolved.   Elevated troponin -in setting sepsis.  -ECHO normal Wall motion.  -Cardiology following, patient is high-risk for anticoagulation due to to GI bleed  Enterobacter Urine:  -30,000 colonies.   Chronic diastolic heart failure - on lasix at home  -Continue lasix (80 mg ) on 8-5.  -Dyspnea resolved -Cardiology following, continue lasix per cardiology recommendation -Heart cath 07/07/2014 -  Avoid ACEI due to AKI.  Severe Sepsis/Septic Shock -Secondary to Enterococcus bacteremia.   -Septic shock resolved, patient did require use of pressors, Levophed -Continue antibiotics as stated above -Blood pressure currently stable  Acute kidney injury -in setting of infection. Monitor on Lasix. Good urine output.  -Continue to monitor creatinine  Thrombocytopenia,  -History of ITP, first IVIG treatment  8/3, on chronic prednisone.  -Platelets stable, 54 -Dr. Marin Olp following and appreciated -Received one dose of Nplate and received epogen on 8/8 -Given a unit of platelets today.  Will continue to monitor. -Patient will likely need additional units before surgery.  Suspected Adrenal insufficiency ( chronic steroids )  -Continue with prednisone.   History of paroxysmal atrial fibrillation  -Currently back in sinus.  -Pacemaker was removed, however per cardiology notes, patient will need placement of pacemaker post surgery.  -No anticoagulation at this time due to patient's thrombocytopenia.   Code Status: Full  Family Communication: Wife at bedside  Disposition Plan: Admitted, pending AV replacement.  Time Spent in minutes   25 minutes  Procedures 6/24 TTE >>> LVEF 55%, Grade 2 DD  8/3 CT chest >>> bilateral effusions, bilateral GGO, several nodular opacities  8/4 ECHO >>> Mild to mod MR, EF 50-55%, G2 Dia Dys Fcn  TEE: normal LV function; severe MR; large aortic valve vegetation with moderate AI; no pacemaker vegetation identified 8/11 Pacemaker extraction 8/13 PICC line placed 8/14 Heart cath>>nonobstructive distal LM, widely patent LAD and stent in mid vessel, severely disease small to moderate sized diagonal, heavily calcified ostial circ with 70% stenosis, 50-70% stenosis of a small OM2, widely patent RCA with moderate irregularities.    Consults   Cardiology Cardiothoracic surgery Neurology Infectious disease Oncology Dental  DVT Prophylaxis  SCDs  Lab Results  Component Value Date   PLT 54* 07/18/2014    Medications  Scheduled Meds: . amLODipine  7.5 mg Oral Daily  . ampicillin (OMNIPEN) IV  2 g Intravenous 4 times per day  . atorvastatin  10 mg Oral QPM  . chlorhexidine  15 mL Mouth/Throat BID  . feeding supplement (ENSURE COMPLETE)  237 mL Oral TID WC  . furosemide  80 mg Oral Daily  . gentamicin  100 mg Intravenous Q24H  . IMMUNE GLOBULIN 10% (HUMAN)  IV - For Fluid Restriction Only  400 mg/kg Intravenous Q24H  . insulin aspart  0-9 Units Subcutaneous TID WC  . pantoprazole  40 mg Oral Daily  . predniSONE  40 mg Oral Q breakfast  . pyridOXINE  100 mg Oral QPM  . sodium chloride  10-40 mL Intracatheter Q12H  . sodium chloride  10-40 mL Intracatheter Q12H  . tamsulosin  0.4 mg Oral QPC supper  . vitamin E  400 Units Oral Daily   Continuous Infusions:  PRN Meds:.acetaminophen, ondansetron (ZOFRAN) IV, polyvinyl alcohol, sodium chloride, sodium chloride  Antibiotics    Anti-infectives   Start     Dose/Rate Route Frequency Ordered Stop   07/16/14 1300  gentamicin (GARAMYCIN) IVPB 100 mg     100 mg 200 mL/hr over 30 Minutes Intravenous Every 24 hours 07/15/14 1917     07/05/2014 1045  gentamicin (GARAMYCIN) 80 mg in sodium chloride irrigation 0.9 % 500 mL irrigation  Status:  Discontinued     80 mg Irrigation On call 07/27/2014 1031 07/28/2014 1805   07/12/14 1300  gentamicin (GARAMYCIN) IVPB 80 mg  Status:  Discontinued     80 mg 100 mL/hr over 30 Minutes Intravenous Every 24 hours  07/12/14 1200 07/15/14 1917   07/11/14 1200  ampicillin (OMNIPEN) 2 g in sodium chloride 0.9 % 50 mL IVPB     2 g 150 mL/hr over 20 Minutes Intravenous 4 times per day 07/11/14 0945     07/07/14 0030  piperacillin-tazobactam (ZOSYN) IVPB 3.375 g  Status:  Discontinued     3.375 g 12.5 mL/hr over 240 Minutes Intravenous Every 8 hours 07/13/2014 2042 07/08/14 1602   07/16/2014 2300  levofloxacin (LEVAQUIN) IVPB 750 mg  Status:  Discontinued     750 mg 100 mL/hr over 90 Minutes Intravenous Every 48 hours 07/15/2014 2232 07/08/14 1022   07/27/2014 1615  piperacillin-tazobactam (ZOSYN) IVPB 3.375 g     3.375 g 100 mL/hr over 30 Minutes Intravenous  Once 07/11/2014 1604 08/01/2014 1755   07/28/2014 1615  vancomycin (VANCOCIN) IVPB 1000 mg/200 mL premix     1,000 mg 200 mL/hr over 60 Minutes Intravenous  Once 07/04/2014 1604 07/29/2014 1930   07/17/2014 0030  vancomycin (VANCOCIN)  IVPB 750 mg/150 ml premix  Status:  Discontinued     750 mg 150 mL/hr over 60 Minutes Intravenous Every 12 hours 07/28/2014 2042 07/11/14 0945        Subjective:   Justin [name] seen and examined today.  Patient has no complaints.  He would like to walk a bit today.  His wife is at bedside.  Denies chest pain, palpitations, shortness of breath.  Objective:   Filed Vitals:   07/18/14 0830 07/18/14 0845 07/18/14 0900 07/18/14 0915  BP: 166/76 182/54 152/38 148/44  Pulse:      Temp:      TempSrc:      Resp: 23 22 24 25   Height:      Weight:      SpO2:        Wt Readings from Last 3 Encounters:  07/18/14 88 kg (194 lb 0.1 oz)  07/18/14 88 kg (194 lb 0.1 oz)  07/18/14 88 kg (194 lb 0.1 oz)     Intake/Output Summary (Last 24 hours) at 07/18/14 1016 Last data filed at 07/18/14 0849  Gross per 24 hour  Intake   1335 ml  Output    850 ml  Net    485 ml    Exam  General: Well developed, well nourished, NAD, appears stated age  75: NCAT, mucous membranes moist.   Cardiovascular: S1 S2 auscultated, Regular  Respiratory: Clear to ascultation bilaterally, scattered wheezing  Abdomen: Soft, nontender, nondistended, + bowel sounds  Extremities: warm dry without cyanosis clubbing. + edema in LE B/L  Neuro: AAOx3, no focal deficits  Psych: Normal affect and demeanor with intact judgement and insight  Data Review   Micro Results Recent Results (from the past 240 hour(s))  CULTURE, BLOOD (ROUTINE X 2)     Status: None   Collection Time    07/08/14 12:00 PM      Result Value Ref Range Status   Specimen Description BLOOD RIGHT HAND   Final   Special Requests BOTTLES DRAWN AEROBIC ONLY [facility]   Final   Culture  Setup Time     Final   Value: 07/08/2014 17:14     Performed at [facility]   Culture     Final   Value: NO GROWTH 5 DAYS     Performed at [facility]   Report Status 07/15/2014 FINAL   Final  CULTURE, BLOOD (ROUTINE X 2)     Status: None    Collection Time  07/08/14 12:10 PM      Result Value Ref Range Status   Specimen Description BLOOD LEFT HAND   Final   Special Requests BOTTLES DRAWN AEROBIC ONLY [facility]   Final   Culture  Setup Time     Final   Value: 07/08/2014 17:05     Performed at [facility]   Culture     Final   Value: NO GROWTH 5 DAYS     Performed at [facility]   Report Status 07/30/2014 FINAL   Final  FECAL OCCULT BLOOD, IMMUNOCHEMICAL     Status: None   Collection Time    07/13/14  5:24 PM      Result Value Ref Range Status   Fecal Occult Bld Negative  Negative Final  MRSA PCR SCREENING     Status: None   Collection Time    07/19/2014  6:17 PM      Result Value Ref Range Status   MRSA by PCR NEGATIVE  NEGATIVE Final   Comment:            The GeneXpert MRSA Assay (FDA     approved for NASAL specimens     only), is one component of a     comprehensive MRSA colonization     surveillance program. It is not     intended to diagnose MRSA     infection nor to guide or     monitor treatment for     MRSA infections.    Radiology Reports Ct Abdomen Pelvis Wo Contrast  07/09/2014   CLINICAL DATA:  Enterococcus bacteremia. Evaluate for abdominal source. Evaluate for potential tumor. Enterobacter is also present in the urine.  EXAM: CT ABDOMEN AND PELVIS WITHOUT CONTRAST  TECHNIQUE: Multidetector CT imaging of the abdomen and pelvis was performed following the standard protocol without IV contrast.  COMPARISON:  No priors.  FINDINGS: Lung Bases: Atherosclerotic calcifications in the right coronary artery. Pacemaker lead terminating in the right ventricular apex. Moderate-sized hiatal hernia. Moderate to large bilateral pleural effusions which appear to layer dependently and are associated with extensive passive atelectasis in the visualized portions of the lower lobes of the lungs bilaterally.  Abdomen/Pelvis: 9 mm low attenuation lesion in segment 2 of the liver is incompletely characterized on  today's noncontrast CT examination, but is unchanged, and favored to represent a tiny cyst. No other focal hepatic lesions are identified on today's non contrast CT examination. There is a tiny calcifications in the head of the pancreas (image 31 of series 2) which appears to be immediately posterior to the distal common bile duct, potentially related to prior episodes of pancreatitis. Other small calcifications are also noted in the head and tail of the pancreas as well. The calcification in the head of the pancreas is not favored to be within the common bile duct, as the common bile duct is normal in caliber measuring 4 mm, and there is no intrahepatic biliary ductal dilatation to suggest obstruction. The unenhanced appearance of the gallbladder, spleen and bilateral adrenal glands is unremarkable. There is bilateral perinephric stranding which is nonspecific. 1.5 cm intermediate attenuation lesion extending exophytically off the lateral aspect of the upper pole of the right kidney is incompletely characterized on today's non contrast CT examination, but is statistically likely to represent a mildly proteinaceous cyst.  Normal appendix. Trace volume of ascites. No pneumoperitoneum. No pathologic distention of small bowel. 10 mm short axis inguinal lymph nodes bilaterally are nonspecific. No other definite lymphadenopathy  identified in the abdomen or pelvis on today's non contrast CT examination. Mild diffuse mesenteric edema. Prostate gland is enlarged measuring 4.9 x 6.1 cm. Urinary bladder is generally unremarkable in appearance, however, the attenuation values in the urine are rather high ranging from 45-55 HU, suggesting proteinaceous or hemorrhagic contents.  Musculoskeletal: Mild diffuse body wall edema. There are no aggressive appearing lytic or blastic lesions noted in the visualized portions of the skeleton.  IMPRESSION: 1. No definite source for bacteremia identified on today's examination. 2. Relatively  high attenuation of the urine is of uncertain etiology and significance, but may suggest some proteinaceous or hemorrhagic contents. 3. Findings suggestive of anasarca, including moderate to large bilateral pleural effusions, trace volume of ascites, mild diffuse mesenteric edema and diffuse body wall edema. 4. Prostatomegaly. 5. Additional incidental findings, as above.   Electronically Signed   By: Vinnie Langton M.D.   On: 07/09/2014 12:46   Dg Chest 2 View  07/15/2014   CLINICAL DATA:  Pacemaker extraction  EXAM: CHEST  2 VIEW  COMPARISON:  07/07/2014  FINDINGS: Cardiac pacer has been removed with generator and 2 leads absent. Mild interstitial change in peribronchial cuffing. These findings are less prominent when compared to the prior study. There is consolidation in the medial left lung base which appears most consistent with atelectasis.  IMPRESSION: 1. Mild residual interstitial change may reflect minimal remaining interstitial pulmonary edema 2. Left lower lobe atelectasis   Electronically Signed   By: Skipper Cliche M.D.   On: 07/15/2014 08:15   Dg Chest 2 View  07/25/2014   CLINICAL DATA:  Fever, chills, shortness of breath.  EXAM: CHEST  2 VIEW  COMPARISON:  06/10/2014  FINDINGS: Left pacer in place with leads in the right atrium and right ventricle. Heart is normal size. Mild peribronchial thickening. No confluent airspace opacities or effusions. No acute bony abnormality.  IMPRESSION: Bronchitic changes.   Electronically Signed   By: Rolm Baptise M.D.   On: 07/18/2014 15:07   Ct Head Wo Contrast  07/09/2014   CLINICAL DATA:  Hallucinations.  Thrombocytopenia  EXAM: CT HEAD WITHOUT CONTRAST  TECHNIQUE: Contiguous axial images were obtained from the base of the skull through the vertex without intravenous contrast.  COMPARISON:  None.  FINDINGS: Mild atrophy. Negative for hemorrhage. Negative for acute infarct or mass. Calvarium intact.  IMPRESSION: No acute abnormality.   Electronically  Signed   By: Franchot Gallo M.D.   On: 07/09/2014 18:47   Ct Angio Chest W/cm &/or Wo Cm  07/25/2014   CLINICAL DATA:  Shortness of breath and fever  EXAM: CT ANGIOGRAPHY CHEST WITH CONTRAST  TECHNIQUE: Multidetector CT imaging of the chest was performed using the standard protocol during bolus administration of intravenous contrast. Multiplanar CT image reconstructions and MIPs were obtained to evaluate the vascular anatomy.  CONTRAST:  68mL OMNIPAQUE IOHEXOL 350 MG/ML SOLN  COMPARISON:  Chest radiograph July 06, 2014  FINDINGS: There is no demonstrable pulmonary embolus. There is no thoracic aortic aneurysm or dissection.  There are free-flowing pleural effusions bilaterally. There is hazy ground-glass opacity diffusely.  On axial CT slice 55 series 6, there is a 6 x 4 mm nodular opacity in the medial segment of the right middle lobe. On axial slice 64 series 6, there is a 6 x 4 mm nodular opacity in the lateral segment of the left lower lobe. On axial slice 62 series 6, there is a 7 x 5 mm nodular opacity in the lateral  segment of the right middle lobe. On axial slice 69 series 6, there is a 5 x 4 mm nodular opacity in the superior segment of the right lower lobe.  There is no appreciable thoracic adenopathy. Pericardium is not thickened. Pacemaker leads are attached to the right atrium and right ventricle. There is a focal hiatal hernia.  Visualized upper abdominal structures appear unremarkable. There are no blastic or lytic bone lesions. There is degenerative change in the thoracic spine. Thyroid appears unremarkable.  Review of the MIP images confirms the above findings.  IMPRESSION: No demonstrable pulmonary embolus.  Bilateral effusions with extensive ground-glass opacity bilaterally. Suspect alveolar edema with findings consistent with congestive heart failure. Atypical infection or allergic type phenomenon are differential considerations, however.  Several nodular opacities, largest measuring 7 mm.  Followup of these nodular opacity should be based on Fleischner Society guidelines. If the patient is at high risk for bronchogenic carcinoma, follow-up chest CT at 3-50months is recommended. If the patient is at low risk for bronchogenic carcinoma, follow-up chest CT at 6-12 months is recommended. This recommendation follows the consensus statement: Guidelines for Management of Small Pulmonary Nodules Detected on CT Scans: A Statement from the [place] as published in Radiology 2005; 237:395-400.  Focal hiatal hernia.  No adenopathy.   Electronically Signed   By: Lowella Grip M.D.   On: 08/02/2014 18:09   Dg Chest Port 1 View  07/07/2014   CLINICAL DATA:  Evaluate airspace.  EXAM: PORTABLE CHEST - 1 VIEW  COMPARISON:  CT 07/11/2014.  Chest x-ray 07/23/2014 and 06/10/2014.  FINDINGS: Cardiac pacer with lead tips in right atrium and right ventricle. Mediastinum and hilar structures are unremarkable. Cardiomegaly with mild pulmonary vascular prominence and interstitial prominence consistent with congestive heart failure. Small pleural effusions. No pneumothorax. Reference is made to recent chest CT which demonstrated small pulmonary nodules. These are not definitely identified by chest x-ray. No acute bony abnormality.  IMPRESSION: 1. Persistent changes of congestive heart failure pulmonary edema. Small pleural effusions. 2. Reference is made to recent chest CT which demonstrated small pulmonary nodules. Follow-up is recommended as on prior CT report.   Electronically Signed   By: Marcello Moores  Register   On: 07/07/2014 07:26    CBC  Recent Labs Lab 07/15/14 0317 07/16/14 0357 07/16/14 2200 07/20/2014 0500 07/18/14 0500  WBC 8.6 7.4 8.2 8.2 7.8  HGB 10.1* 10.3* 10.9* 10.4* 10.0*  HCT 31.8* 32.1* 34.6* 32.2* 31.8*  PLT 41* 39* 41* 36* 54*  MCV 101.9* 102.6* 103.9* 102.9* 105.3*  MCH 32.4 32.9 32.7 33.2 33.1  MCHC 31.8 32.1 31.5 32.3 31.4  RDW 23.0* 23.6* 23.8* 23.9* 24.3*    Chemistries    Recent Labs Lab 07/18/2014 0537 07/15/14 0317 07/16/14 0357 07/16/14 2200 07/24/2014 0500 07/18/14 0500  NA 142 141 143 139 141 139  K 3.7 4.0 3.9 4.3 3.7 4.1  CL 104 103 104 99 100 101  CO2 23 24 27 25 27 27   GLUCOSE 103* 106* 118* 157* 105* 96  BUN 32* 31* 28* 29* 26* 23  CREATININE 1.36* 1.41* 1.40* 1.54* 1.47* 1.45*  CALCIUM 8.2* 8.3* 8.2* 8.1* 8.2* 8.4  AST 56* 50* 41*  --  40* 41*  ALT 54* 47 36  --  35 34  ALKPHOS 62 64 59  --  59 62  BILITOT 0.8 0.7 0.8  --  0.7 0.6   ------------------------------------------------------------------------------------------------------------------ estimated creatinine clearance is 44 ml/min (by C-G formula based on Cr of 1.45). ------------------------------------------------------------------------------------------------------------------  Recent Labs  07/13/2014 0500  HGBA1C 5.1   ------------------------------------------------------------------------------------------------------------------ No results found for this basename: CHOL, HDL, LDLCALC, TRIG, CHOLHDL, LDLDIRECT,  in the last 72 hours ------------------------------------------------------------------------------------------------------------------  Recent Labs  07/07/2014 0500  TSH 4.750*   ------------------------------------------------------------------------------------------------------------------ No results found for this basename: VITAMINB12, FOLATE, FERRITIN, TIBC, IRON, RETICCTPCT,  in the last 72 hours  Coagulation profile  Recent Labs Lab 07/16/14 2200  INR 1.21    No results found for this basename: DDIMER,  in the last 72 hours  Cardiac Enzymes No results found for this basename: CK, CKMB, TROPONINI, MYOGLOBIN,  in the last 168 hours ------------------------------------------------------------------------------------------------------------------ No components found with this basename: POCBNP,     Mychaela Lennartz D.O. on 07/18/2014 at 10:16  AM  Between 7am to 7pm - Pager - [phone]  After 7pm go to [email or web address] - password [email or web address]  And look for the night coverage person covering for me after hours  [facility] Office  [phone]

## 2014-07-19 DIAGNOSIS — Z01810 Encounter for preprocedural cardiovascular examination: Secondary | ICD-10-CM

## 2014-07-19 LAB — CBC
HCT: 34 % — ABNORMAL LOW (ref 39.0–52.0)
Hemoglobin: 10.7 g/dL — ABNORMAL LOW (ref 13.0–17.0)
MCH: 33.2 pg (ref 26.0–34.0)
MCHC: 31.5 g/dL (ref 30.0–36.0)
MCV: 105.6 fL — ABNORMAL HIGH (ref 78.0–100.0)
PLATELETS: 57 10*3/uL — AB (ref 150–400)
RBC: 3.22 MIL/uL — ABNORMAL LOW (ref 4.22–5.81)
RDW: 23.8 % — AB (ref 11.5–15.5)
WBC: 7.8 10*3/uL (ref 4.0–10.5)

## 2014-07-19 LAB — COMPREHENSIVE METABOLIC PANEL
ALBUMIN: 2.3 g/dL — AB (ref 3.5–5.2)
ALK PHOS: 69 U/L (ref 39–117)
ALT: 37 U/L (ref 0–53)
AST: 44 U/L — AB (ref 0–37)
Anion gap: 9 (ref 5–15)
BUN: 25 mg/dL — ABNORMAL HIGH (ref 6–23)
CO2: 28 mEq/L (ref 19–32)
CREATININE: 1.53 mg/dL — AB (ref 0.50–1.35)
Calcium: 8.4 mg/dL (ref 8.4–10.5)
Chloride: 100 mEq/L (ref 96–112)
GFR calc Af Amer: 48 mL/min — ABNORMAL LOW (ref >90)
GFR calc non Af Amer: 42 mL/min — ABNORMAL LOW (ref >90)
Glucose, Bld: 111 mg/dL — ABNORMAL HIGH (ref 70–99)
Potassium: 4.1 mEq/L (ref 3.7–5.3)
Sodium: 137 mEq/L (ref 137–147)
Total Bilirubin: 0.6 mg/dL (ref 0.3–1.2)
Total Protein: 6.9 g/dL (ref 6.0–8.3)

## 2014-07-19 LAB — GLUCOSE, CAPILLARY
GLUCOSE-CAPILLARY: 153 mg/dL — AB (ref 70–99)
GLUCOSE-CAPILLARY: 123 mg/dL — AB (ref 70–99)
Glucose-Capillary: 179 mg/dL — ABNORMAL HIGH (ref 70–99)
Glucose-Capillary: 196 mg/dL — ABNORMAL HIGH (ref 70–99)

## 2014-07-19 LAB — GENTAMICIN LEVEL, TROUGH: Gentamicin Trough: 0.6 ug/mL (ref 0.5–2.0)

## 2014-07-19 LAB — LACTATE DEHYDROGENASE: LDH: 548 U/L — AB (ref 94–250)

## 2014-07-19 LAB — GENTAMICIN LEVEL, PEAK: Gentamicin Pk: 8.7 ug/mL (ref 5.0–10.0)

## 2014-07-19 MED ORDER — FLUTICASONE PROPIONATE 50 MCG/ACT NA SUSP
1.0000 | Freq: Every day | NASAL | Status: DC
  Administered 2014-07-20 – 2014-07-23 (×4): 1 via NASAL
  Filled 2014-07-19: qty 16

## 2014-07-19 NOTE — Progress Notes (Signed)
[facility]                                                                              Patient Demographics  Justin [name], is a 80 y.o. male, DOB - 07/27/1933, NOB:[id number]  Admit date - 07/18/2014   Admitting Physician Rigoberto Noel, MD  Outpatient Primary MD for the patient is Penni Homans, MD  LOS - 56   Chief Complaint  Patient presents with  . Shortness of Breath      HPI on 07/04/2014 80 y.o. male with history of coronary artery disease status post stents, hypertension, dyslipidemia, atrial fibrillation no longer on anticoagulation secondary to recent GI bleed, pacemaker, ITP currently being treated with IVIG by Dr. Marin Olp (most recently 8/3) and prednisone 80mg  daily, presented to the emergency department with complaints of fever and shortness of breath that started 8/2. In ED, patient found to have leukocytosis, hypotension, and lactic acidosis. Given 1L IVF bolus and started on levophed. Transferred to MICU for suspected sepsis.   Interim history Patient was hypotensive and required pressors. She was found to have septic shock with enterococcus bacteremia, infectious disease was consulted. TEE was recommended which showed a large aortic valve vegetation. 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 Patient was noted to have an episode of TIA, and neurology was consulted. Patient's pacemaker was removed on 07/30/2014. Cardiothoracic surgery was also consulted for further evaluation. Patient had PICC placed on 07/16/14.  He will have a heart cath 07/05/2014.  Assessment & Plan   Enterococcus Bacteremia with Aortic Valve Vegetation:  -Received IV vancomycin for 4 days. Repeated Blood Culture 8-5 no growth to date. WBC trending down. -CT abdomen no mas.  -S/P TEE which showed Large Aortic Valve Vegetation.  -Has PICC line, double lumen. -Vancomycin was changed to ampicillin 8-8.  Gentamycin added on  8-9.  -HIV non reactive, viral hepatitis negative. .  -Cardiothoracic surgery also consulted, possible surgery on 07/22/2014 -Heart cath on 07/13/2014 showed: nonobstructive distal LM, widely patent LAD and stent in mid vessel, severely disease small to moderate sized diagonal, heavily calcified ostial circ with 70% stenosis, 50-70% stenosis of a small OM2, widely patent RCA with moderate irregularities.   Mental status/Transient Hallucination -Occurred on 07/09/2014, resolved -May have been secondary to medications versus delirium -CT of the head was negative  Transient hand weakness, numbness, likely TIA -Carotid Duplex: Bilateral: 1-39% ICA stenosis. Vertebral artery flow is antegrade. Right subclavian artery demonstrates significant stenosis compared to left subclavian artery. Medical management for subclavian artery stenosis.  -Hemoglobin A1c 5.6 -Continue with Lipitor. Follow LFT -TEE showed Aortic valve vegetation, treatment and plan as above -Neurology has signed off  Possible pneumonia vs TRALI from IVIG  -Bilateral effusions - stable   Diarrhea -Resolved.   Elevated troponin -in setting sepsis.  -ECHO normal Wall motion.  -Cardiology following, patient is high-risk for anticoagulation due to to GI bleed  Enterobacter Urine:  -30,000 colonies.   Chronic diastolic heart failure - on lasix at home  -Continue lasix (80 mg ) on 8-5.  -Dyspnea resolved -Cardiology following, continue lasix per cardiology recommendation -Heart cath 07/12/2014 -  Avoid ACEI due to AKI. -has good urine output  Severe Sepsis/Septic Shock -Secondary to Enterococcus bacteremia.   -Septic shock resolved, patient did require use of pressors, Levophed -Continue antibiotics as stated above -Blood pressure currently stable  Acute kidney injury -in setting of infection. Monitor on Lasix. Good urine output.  -Continue to monitor creatinine  Thrombocytopenia,  -History of ITP,  first IVIG treatment 8/3, on chronic prednisone.  -Platelets stable, 57 -Dr. Marin Olp following and appreciated -Received one dose of Nplate and received epogen on 8/8 -Given a unit of platelets today.  Will continue to monitor. -Patient will likely need additional units before surgery.  Suspected Adrenal insufficiency ( chronic steroids )  -Continue with prednisone.   History of paroxysmal atrial fibrillation  -Currently back in sinus.  -Pacemaker was removed, however per cardiology notes, patient will need placement of pacemaker post surgery.  -No anticoagulation at this time due to patient's thrombocytopenia.   Nasal congestion -Will order flonase  Code Status: Full  Family Communication: Wife at bedside  Disposition Plan: Admitted, pending AV replacement.  Time Spent in minutes   25 minutes  Procedures 6/24 TTE >>> LVEF 55%, Grade 2 DD  8/3 CT chest >>> bilateral effusions, bilateral GGO, several nodular opacities  8/4 ECHO >>> Mild to mod MR, EF 50-55%, G2 Dia Dys Fcn  TEE: normal LV function; severe MR; large aortic valve vegetation with moderate AI; no pacemaker vegetation identified 8/11 Pacemaker extraction 8/13 PICC line placed 8/14 Heart cath>>nonobstructive distal LM, widely patent LAD and stent in mid vessel, severely disease small to moderate sized diagonal, heavily calcified ostial circ with 70% stenosis, 50-70% stenosis of a small OM2, widely patent RCA with moderate irregularities.    Consults   Cardiology Cardiothoracic surgery Neurology Infectious disease Oncology Dental  DVT Prophylaxis  SCDs  Lab Results  Component Value Date   PLT 57* 07/19/2014    Medications  Scheduled Meds: . amLODipine  7.5 mg Oral Daily  . ampicillin (OMNIPEN) IV  2 g Intravenous 4 times per day  . atorvastatin  10 mg Oral QPM  . chlorhexidine  15 mL Mouth/Throat BID  . feeding supplement (ENSURE COMPLETE)  237 mL Oral TID WC  . furosemide  80 mg Oral Daily  .  gentamicin  100 mg Intravenous Q24H  . IMMUNE GLOBULIN 10% (HUMAN) IV - For Fluid Restriction Only  400 mg/kg Intravenous Q24H  . insulin aspart  0-9 Units Subcutaneous TID WC  . pantoprazole  40 mg Oral Daily  . predniSONE  40 mg Oral Q breakfast  . pyridOXINE  100 mg Oral QPM  . sodium chloride  10-40 mL Intracatheter Q12H  . tamsulosin  0.4 mg Oral QPC supper  . vitamin E  400 Units Oral Daily   Continuous Infusions:  PRN Meds:.acetaminophen, ondansetron (ZOFRAN) IV, polyvinyl alcohol, sodium chloride, sodium chloride  Antibiotics    Anti-infectives   Start     Dose/Rate Route Frequency Ordered Stop   07/16/14 1300  gentamicin (GARAMYCIN) IVPB 100 mg     100 mg 200 mL/hr over 30 Minutes Intravenous Every 24 hours 07/15/14 1917     07/04/2014 1045  gentamicin (GARAMYCIN) 80 mg in sodium chloride irrigation 0.9 % 500 mL irrigation  Status:  Discontinued     80 mg Irrigation On call 07/12/2014 1031 07/08/2014 1805   07/12/14 1300  gentamicin (GARAMYCIN) IVPB 80 mg  Status:  Discontinued     80 mg 100 mL/hr over 30 Minutes Intravenous Every 24  hours 07/12/14 1200 07/15/14 1917   07/11/14 1200  ampicillin (OMNIPEN) 2 g in sodium chloride 0.9 % 50 mL IVPB     2 g 150 mL/hr over 20 Minutes Intravenous 4 times per day 07/11/14 0945     07/07/14 0030  piperacillin-tazobactam (ZOSYN) IVPB 3.375 g  Status:  Discontinued     3.375 g 12.5 mL/hr over 240 Minutes Intravenous Every 8 hours 07/29/2014 2042 07/08/14 1602   07/19/2014 2300  levofloxacin (LEVAQUIN) IVPB 750 mg  Status:  Discontinued     750 mg 100 mL/hr over 90 Minutes Intravenous Every 48 hours 07/04/2014 2232 07/08/14 1022   07/24/2014 1615  piperacillin-tazobactam (ZOSYN) IVPB 3.375 g     3.375 g 100 mL/hr over 30 Minutes Intravenous  Once 07/10/2014 1604 08/01/2014 1755   08/02/2014 1615  vancomycin (VANCOCIN) IVPB 1000 mg/200 mL premix     1,000 mg 200 mL/hr over 60 Minutes Intravenous  Once 07/25/2014 1604 07/19/2014 1930   07/10/2014 0030   vancomycin (VANCOCIN) IVPB 750 mg/150 ml premix  Status:  Discontinued     750 mg 150 mL/hr over 60 Minutes Intravenous Every 12 hours 07/09/2014 2042 07/11/14 0945        Subjective:   Justin [name] seen and examined today.  Patient complains of nasal congestion. Denies chest pain, palpitations, shortness of breath, dizziness, abdominal pain.  Objective:   Filed Vitals:   07/18/14 2200 07/18/14 2313 07/19/14 0326 07/19/14 0328  BP:  118/42  158/50  Pulse: 76 73  71  Temp:  98.5 F (36.9 C)  98.9 F (37.2 C)  TempSrc:  Oral  Oral  Resp: 24 16  14   Height:      Weight:   90.5 kg (199 lb 8.3 oz)   SpO2: 95% 94%  96%    Wt Readings from Last 3 Encounters:  07/19/14 90.5 kg (199 lb 8.3 oz)  07/19/14 90.5 kg (199 lb 8.3 oz)  07/19/14 90.5 kg (199 lb 8.3 oz)     Intake/Output Summary (Last 24 hours) at 07/19/14 0315 Last data filed at 07/19/14 9458  Gross per 24 hour  Intake   1510 ml  Output   2625 ml  Net  -1115 ml    Exam  General: Well developed, well nourished, NAD, appears stated age  78: NCAT, mucous membranes moist.   Cardiovascular: S1 S2 auscultated, Regular, 1/6 SEM  Respiratory: Clear to ascultation bilaterally, scattered wheezing  Abdomen: Soft, nontender, nondistended, + bowel sounds  Extremities: warm dry without cyanosis clubbing. + 2 edema in LE B/L  Neuro: AAOx3, no focal deficits  Psych: Normal affect and demeanor with intact judgement and insight  Data Review   Micro Results Recent Results (from the past 240 hour(s))  FECAL OCCULT BLOOD, IMMUNOCHEMICAL     Status: None   Collection Time    07/13/14  5:24 PM      Result Value Ref Range Status   Fecal Occult Bld Negative  Negative Final  MRSA PCR SCREENING     Status: None   Collection Time    07/29/2014  6:17 PM      Result Value Ref Range Status   MRSA by PCR NEGATIVE  NEGATIVE Final   Comment:            The GeneXpert MRSA Assay (FDA     approved for NASAL specimens     only),  is one component of a     comprehensive MRSA colonization  surveillance program. It is not     intended to diagnose MRSA     infection nor to guide or     monitor treatment for     MRSA infections.    Radiology Reports Ct Abdomen Pelvis Wo Contrast  07/09/2014   CLINICAL DATA:  Enterococcus bacteremia. Evaluate for abdominal source. Evaluate for potential tumor. Enterobacter is also present in the urine.  EXAM: CT ABDOMEN AND PELVIS WITHOUT CONTRAST  TECHNIQUE: Multidetector CT imaging of the abdomen and pelvis was performed following the standard protocol without IV contrast.  COMPARISON:  No priors.  FINDINGS: Lung Bases: Atherosclerotic calcifications in the right coronary artery. Pacemaker lead terminating in the right ventricular apex. Moderate-sized hiatal hernia. Moderate to large bilateral pleural effusions which appear to layer dependently and are associated with extensive passive atelectasis in the visualized portions of the lower lobes of the lungs bilaterally.  Abdomen/Pelvis: 9 mm low attenuation lesion in segment 2 of the liver is incompletely characterized on today's noncontrast CT examination, but is unchanged, and favored to represent a tiny cyst. No other focal hepatic lesions are identified on today's non contrast CT examination. There is a tiny calcifications in the head of the pancreas (image 31 of series 2) which appears to be immediately posterior to the distal common bile duct, potentially related to prior episodes of pancreatitis. Other small calcifications are also noted in the head and tail of the pancreas as well. The calcification in the head of the pancreas is not favored to be within the common bile duct, as the common bile duct is normal in caliber measuring 4 mm, and there is no intrahepatic biliary ductal dilatation to suggest obstruction. The unenhanced appearance of the gallbladder, spleen and bilateral adrenal glands is unremarkable. There is bilateral perinephric  stranding which is nonspecific. 1.5 cm intermediate attenuation lesion extending exophytically off the lateral aspect of the upper pole of the right kidney is incompletely characterized on today's non contrast CT examination, but is statistically likely to represent a mildly proteinaceous cyst.  Normal appendix. Trace volume of ascites. No pneumoperitoneum. No pathologic distention of small bowel. 10 mm short axis inguinal lymph nodes bilaterally are nonspecific. No other definite lymphadenopathy identified in the abdomen or pelvis on today's non contrast CT examination. Mild diffuse mesenteric edema. Prostate gland is enlarged measuring 4.9 x 6.1 cm. Urinary bladder is generally unremarkable in appearance, however, the attenuation values in the urine are rather high ranging from 45-55 HU, suggesting proteinaceous or hemorrhagic contents.  Musculoskeletal: Mild diffuse body wall edema. There are no aggressive appearing lytic or blastic lesions noted in the visualized portions of the skeleton.  IMPRESSION: 1. No definite source for bacteremia identified on today's examination. 2. Relatively high attenuation of the urine is of uncertain etiology and significance, but may suggest some proteinaceous or hemorrhagic contents. 3. Findings suggestive of anasarca, including moderate to large bilateral pleural effusions, trace volume of ascites, mild diffuse mesenteric edema and diffuse body wall edema. 4. Prostatomegaly. 5. Additional incidental findings, as above.   Electronically Signed   By: Vinnie Langton M.D.   On: 07/09/2014 12:46   Dg Chest 2 View  07/15/2014   CLINICAL DATA:  Pacemaker extraction  EXAM: CHEST  2 VIEW  COMPARISON:  07/07/2014  FINDINGS: Cardiac pacer has been removed with generator and 2 leads absent. Mild interstitial change in peribronchial cuffing. These findings are less prominent when compared to the prior study. There is consolidation in the medial left lung base which  appears most  consistent with atelectasis.  IMPRESSION: 1. Mild residual interstitial change may reflect minimal remaining interstitial pulmonary edema 2. Left lower lobe atelectasis   Electronically Signed   By: Skipper Cliche M.D.   On: 07/15/2014 08:15   Dg Chest 2 View  07/27/2014   CLINICAL DATA:  Fever, chills, shortness of breath.  EXAM: CHEST  2 VIEW  COMPARISON:  06/10/2014  FINDINGS: Left pacer in place with leads in the right atrium and right ventricle. Heart is normal size. Mild peribronchial thickening. No confluent airspace opacities or effusions. No acute bony abnormality.  IMPRESSION: Bronchitic changes.   Electronically Signed   By: Rolm Baptise M.D.   On: 08/01/2014 15:07   Ct Head Wo Contrast  07/09/2014   CLINICAL DATA:  Hallucinations.  Thrombocytopenia  EXAM: CT HEAD WITHOUT CONTRAST  TECHNIQUE: Contiguous axial images were obtained from the base of the skull through the vertex without intravenous contrast.  COMPARISON:  None.  FINDINGS: Mild atrophy. Negative for hemorrhage. Negative for acute infarct or mass. Calvarium intact.  IMPRESSION: No acute abnormality.   Electronically Signed   By: Franchot Gallo M.D.   On: 07/09/2014 18:47   Ct Angio Chest W/cm &/or Wo Cm  07/27/2014   CLINICAL DATA:  Shortness of breath and fever  EXAM: CT ANGIOGRAPHY CHEST WITH CONTRAST  TECHNIQUE: Multidetector CT imaging of the chest was performed using the standard protocol during bolus administration of intravenous contrast. Multiplanar CT image reconstructions and MIPs were obtained to evaluate the vascular anatomy.  CONTRAST:  52mL OMNIPAQUE IOHEXOL 350 MG/ML SOLN  COMPARISON:  Chest radiograph July 06, 2014  FINDINGS: There is no demonstrable pulmonary embolus. There is no thoracic aortic aneurysm or dissection.  There are free-flowing pleural effusions bilaterally. There is hazy ground-glass opacity diffusely.  On axial CT slice 55 series 6, there is a 6 x 4 mm nodular opacity in the medial segment of the right  middle lobe. On axial slice 64 series 6, there is a 6 x 4 mm nodular opacity in the lateral segment of the left lower lobe. On axial slice 62 series 6, there is a 7 x 5 mm nodular opacity in the lateral segment of the right middle lobe. On axial slice 69 series 6, there is a 5 x 4 mm nodular opacity in the superior segment of the right lower lobe.  There is no appreciable thoracic adenopathy. Pericardium is not thickened. Pacemaker leads are attached to the right atrium and right ventricle. There is a focal hiatal hernia.  Visualized upper abdominal structures appear unremarkable. There are no blastic or lytic bone lesions. There is degenerative change in the thoracic spine. Thyroid appears unremarkable.  Review of the MIP images confirms the above findings.  IMPRESSION: No demonstrable pulmonary embolus.  Bilateral effusions with extensive ground-glass opacity bilaterally. Suspect alveolar edema with findings consistent with congestive heart failure. Atypical infection or allergic type phenomenon are differential considerations, however.  Several nodular opacities, largest measuring 7 mm. Followup of these nodular opacity should be based on Fleischner Society guidelines. If the patient is at high risk for bronchogenic carcinoma, follow-up chest CT at 3-86months is recommended. If the patient is at low risk for bronchogenic carcinoma, follow-up chest CT at 6-12 months is recommended. This recommendation follows the consensus statement: Guidelines for Management of Small Pulmonary Nodules Detected on CT Scans: A Statement from the [place] as published in Radiology 2005; 237:395-400.  Focal hiatal hernia.  No adenopathy.   Electronically  Signed   By: Lowella Grip M.D.   On: 08/03/2014 18:09   Dg Chest Port 1 View  07/07/2014   CLINICAL DATA:  Evaluate airspace.  EXAM: PORTABLE CHEST - 1 VIEW  COMPARISON:  CT 07/12/2014.  Chest x-ray 07/16/2014 and 06/10/2014.  FINDINGS: Cardiac pacer with lead tips in  right atrium and right ventricle. Mediastinum and hilar structures are unremarkable. Cardiomegaly with mild pulmonary vascular prominence and interstitial prominence consistent with congestive heart failure. Small pleural effusions. No pneumothorax. Reference is made to recent chest CT which demonstrated small pulmonary nodules. These are not definitely identified by chest x-ray. No acute bony abnormality.  IMPRESSION: 1. Persistent changes of congestive heart failure pulmonary edema. Small pleural effusions. 2. Reference is made to recent chest CT which demonstrated small pulmonary nodules. Follow-up is recommended as on prior CT report.   Electronically Signed   By: Marcello Moores  Register   On: 07/07/2014 07:26    CBC  Recent Labs Lab 07/16/14 0357 07/16/14 2200 07/30/2014 0500 07/18/14 0500 07/19/14 0328  WBC 7.4 8.2 8.2 7.8 7.8  HGB 10.3* 10.9* 10.4* 10.0* 10.7*  HCT 32.1* 34.6* 32.2* 31.8* 34.0*  PLT 39* 41* 36* 54* 57*  MCV 102.6* 103.9* 102.9* 105.3* 105.6*  MCH 32.9 32.7 33.2 33.1 33.2  MCHC 32.1 31.5 32.3 31.4 31.5  RDW 23.6* 23.8* 23.9* 24.3* 23.8*    Chemistries   Recent Labs Lab 07/15/14 0317 07/16/14 0357 07/16/14 2200 07/28/2014 0500 07/18/14 0500 07/19/14 0328  NA 141 143 139 141 139 137  K 4.0 3.9 4.3 3.7 4.1 4.1  CL 103 104 99 100 101 100  CO2 24 27 25 27 27 28   GLUCOSE 106* 118* 157* 105* 96 111*  BUN 31* 28* 29* 26* 23 25*  CREATININE 1.41* 1.40* 1.54* 1.47* 1.45* 1.53*  CALCIUM 8.3* 8.2* 8.1* 8.2* 8.4 8.4  AST 50* 41*  --  40* 41* 44*  ALT 47 36  --  35 34 37  ALKPHOS 64 59  --  59 62 69  BILITOT 0.7 0.8  --  0.7 0.6 0.6   ------------------------------------------------------------------------------------------------------------------ estimated creatinine clearance is 45.1 ml/min (by C-G formula based on Cr of 1.53). ------------------------------------------------------------------------------------------------------------------  Recent Labs   08/01/2014 0500  HGBA1C 5.1   ------------------------------------------------------------------------------------------------------------------ No results found for this basename: CHOL, HDL, LDLCALC, TRIG, CHOLHDL, LDLDIRECT,  in the last 72 hours ------------------------------------------------------------------------------------------------------------------  Recent Labs  07/17/14 0500  TSH 4.750*   ------------------------------------------------------------------------------------------------------------------ No results found for this basename: VITAMINB12, FOLATE, FERRITIN, TIBC, IRON, RETICCTPCT,  in the last 72 hours  Coagulation profile  Recent Labs Lab 07/16/14 2200  INR 1.21    No results found for this basename: DDIMER,  in the last 72 hours  Cardiac Enzymes No results found for this basename: CK, CKMB, TROPONINI, MYOGLOBIN,  in the last 168 hours ------------------------------------------------------------------------------------------------------------------ No components found with this basename: POCBNP,     Justin [name] D.O. on 07/19/2014 at 7:29 AM  Between 7am to 7pm - Pager - [phone]  After 7pm go to [email or web address] - password [email or web address]  And look for the night coverage person covering for me after hours  [facility] Office  [phone]

## 2014-07-19 NOTE — Progress Notes (Addendum)
VASCULAR LAB PRELIMINARY  PRELIMINARY  PRELIMINARY  PRELIMINARY  Pre-op Cardiac Surgery  Carotid Findings:  07/11/2014 Justin [name] ,RVT,RDMS -- Bilateral 1% to 39% ICA stenosis. There is a 50% to 99% right subclavian stenosis more proximal to the innominate artery based on turbulence . The left subclavian flow appears normal. Bilateral vertebral artery flow is antegrade.  Upper Extremity Right Left  Brachial Pressures Restricted limb Triphasic 130 Triphasic  Radial Waveforms Triphasic Triphasic  Ulnar Waveforms Triphasic Reversed  Palmar Arch (Allen's Test) Normal Abnormal   Findings:  Doppler waveforms remained normal with both radial and ulnar compressions on the right. Left Doppler waveform reversed with radial compression and remained normal with ulnar compression.     Justin [name], RVT 07/19/2014, 4:50 PM

## 2014-07-19 NOTE — Progress Notes (Signed)
ANTIBIOTIC CONSULT NOTE - FOLLOW UP  Pharmacy Consult for gentamicin Indication: endocarditis  No Known Allergies  Patient Measurements: Height: 5\' 11"  (180.3 cm) Weight: 199 lb 8.3 oz (90.5 kg) IBW/kg (Calculated) : 75.3  Vital Signs: Temp: 98.3 F (36.8 C) (08/16 1502) Temp src: Oral (08/16 1502) BP: 109/29 mmHg (08/16 1502) Pulse Rate: 72 (08/16 1502) Intake/Output from previous day: 08/15 0701 - 08/16 0700 In: 1510 [P.O.:1200; I.V.:10; IV Piggyback:300] Out: 2625 [Urine:2625] Intake/Output from this shift:    Labs:  Recent Labs  07/25/2014 0500 07/18/14 0500 07/19/14 0328  WBC 8.2 7.8 7.8  HGB 10.4* 10.0* 10.7*  PLT 36* 54* 57*  CREATININE 1.47* 1.45* 1.53*   Estimated Creatinine Clearance: 45.1 ml/min (by C-G formula based on Cr of 1.53).  Recent Labs  07/19/14 1240 07/19/14 1415  GENTTROUGH 0.6  --   GENTPEAK  --  8.7     Microbiology: Recent Results (from the past 720 hour(s))  CULTURE, BLOOD (ROUTINE X 2)     Status: None   Collection Time    07/17/2014  3:30 PM      Result Value Ref Range Status   Specimen Description BLOOD LEFT ARM   Final   Special Requests BOTTLES DRAWN AEROBIC AND ANAEROBIC [facility]   Final   Culture  Setup Time     Final   Value: 08/02/2014 19:23     Performed at [facility]   Culture     Final   Value: ENTEROCOCCUS SPECIES     Note: SUSCEPTIBILITIES PERFORMED ON PREVIOUS CULTURE WITHIN THE LAST 5 DAYS.     Note: Gram Stain Report Called to,Read Back By and Verified With: JAMES ARTIS 07/07/14 AT 0730 RIDK     Performed at [facility]   Report Status 07/09/2014 FINAL   Final  URINE CULTURE     Status: None   Collection Time    07/11/2014  4:18 PM      Result Value Ref Range Status   Specimen Description URINE, CLEAN CATCH   Final   Special Requests NONE   Final   Culture  Setup Time     Final   Value: 07/07/2014 02:46     Performed at [place]     Final   Value: 35,000 COLONIES/ML      Performed at [facility]   Culture     Final   Value: ENTEROBACTER CLOACAE     Performed at [facility]   Report Status 07/09/2014 FINAL   Final   Organism ID, Bacteria ENTEROBACTER CLOACAE   Final  CULTURE, BLOOD (ROUTINE X 2)     Status: None   Collection Time    07/05/2014  4:30 PM      Result Value Ref Range Status   Specimen Description BLOOD R ARM   Final   Special Requests BOTTLES DRAWN AEROBIC AND ANAEROBIC [place]   Final   Culture  Setup Time     Final   Value: 07/22/2014 19:22     Performed at [facility]   Culture     Final   Value: ENTEROCOCCUS SPECIES     Note: Gram Stain Report Called to,Read Back By and Verified With: JAMES ARTIS 07/07/14 AT 0730 [place]     Performed at [facility]   Report Status 07/09/2014 FINAL   Final   Organism ID, Bacteria ENTEROCOCCUS SPECIES   Final  MRSA PCR SCREENING  Status: None   Collection Time    07/21/2014  7:52 PM      Result Value Ref Range Status   MRSA by PCR NEGATIVE  NEGATIVE Final   Comment:            The GeneXpert MRSA Assay (FDA     approved for NASAL specimens     only), is one component of a     comprehensive MRSA colonization     surveillance program. It is not     intended to diagnose MRSA     infection nor to guide or     monitor treatment for     MRSA infections.  CULTURE, BLOOD (ROUTINE X 2)     Status: None   Collection Time    07/08/14 12:00 PM      Result Value Ref Range Status   Specimen Description BLOOD RIGHT HAND   Final   Special Requests BOTTLES DRAWN AEROBIC ONLY [facility]   Final   Culture  Setup Time     Final   Value: 07/08/2014 17:14     Performed at [facility]   Culture     Final   Value: NO GROWTH 5 DAYS     Performed at [facility]   Report Status 07/18/2014 FINAL   Final  CULTURE, BLOOD (ROUTINE X 2)     Status: None   Collection Time    07/08/14 12:10 PM      Result Value Ref Range Status   Specimen Description BLOOD LEFT HAND    Final   Special Requests BOTTLES DRAWN AEROBIC ONLY [place]   Final   Culture  Setup Time     Final   Value: 07/08/2014 17:05     Performed at [facility]   Culture     Final   Value: NO GROWTH 5 DAYS     Performed at [facility]   Report Status 07/13/2014 FINAL   Final  FECAL OCCULT BLOOD, IMMUNOCHEMICAL     Status: None   Collection Time    07/13/14  5:24 PM      Result Value Ref Range Status   Fecal Occult Bld Negative  Negative Final  MRSA PCR SCREENING     Status: None   Collection Time    07/13/2014  6:17 PM      Result Value Ref Range Status   MRSA by PCR NEGATIVE  NEGATIVE Final   Comment:            The GeneXpert MRSA Assay (FDA     approved for NASAL specimens     only), is one component of a     comprehensive MRSA colonization     surveillance program. It is not     intended to diagnose MRSA     infection nor to guide or     monitor treatment for     MRSA infections.    Anti-infectives   Start     Dose/Rate Route Frequency Ordered Stop   07/16/14 1300  gentamicin (GARAMYCIN) IVPB 100 mg     100 mg 200 mL/hr over 30 Minutes Intravenous Every 24 hours 07/15/14 1917     07/25/2014 1045  gentamicin (GARAMYCIN) 80 mg in sodium chloride irrigation 0.9 % 500 mL irrigation  Status:  Discontinued     80 mg Irrigation On call 07/06/2014 1031 07/28/2014 1805   07/12/14 1300  gentamicin (GARAMYCIN) IVPB 80 mg  Status:  Discontinued  80 mg 100 mL/hr over 30 Minutes Intravenous Every 24 hours 07/12/14 1200 07/15/14 1917   07/11/14 1200  ampicillin (OMNIPEN) 2 g in sodium chloride 0.9 % 50 mL IVPB     2 g 150 mL/hr over 20 Minutes Intravenous 4 times per day 07/11/14 0945     07/07/14 0030  piperacillin-tazobactam (ZOSYN) IVPB 3.375 g  Status:  Discontinued     3.375 g 12.5 mL/hr over 240 Minutes Intravenous Every 8 hours 07/28/2014 2042 07/08/14 1602   07/27/2014 2300  levofloxacin (LEVAQUIN) IVPB 750 mg  Status:  Discontinued     750 mg 100 mL/hr over 90 Minutes  Intravenous Every 48 hours 07/26/2014 2232 07/08/14 1022   07/14/2014 1615  piperacillin-tazobactam (ZOSYN) IVPB 3.375 g     3.375 g 100 mL/hr over 30 Minutes Intravenous  Once 07/08/2014 1604 07/29/2014 1755   07/17/2014 1615  vancomycin (VANCOCIN) IVPB 1000 mg/200 mL premix     1,000 mg 200 mL/hr over 60 Minutes Intravenous  Once 07/25/2014 1604 08/02/2014 1930   07/25/2014 0030  vancomycin (VANCOCIN) IVPB 750 mg/150 ml premix  Status:  Discontinued     750 mg 150 mL/hr over 60 Minutes Intravenous Every 12 hours 07/17/2014 2042 07/11/14 0945      Assessment:  80 yo male admitted on 8/3 for shortness of breath. 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 Patient is now s/p pacemaker removal and plan for valve replacement sometime this coming week pending renal function and platelet count. Pharmacy is consulted to dose and adjust gentamicin.  Patient is currently being treated with 100 mg IV q24hr. A peak drawn this afternoon was 8.7 and trough was 0.6.  Timing of levels was appropriate. Called nurse to clarify level was drawn appropriately.  Patient only has access through a double lumen PICC. Nurse drew peak from PICC line, which is the same arm gentamicin infused in.  Will order a repeat gent peak for tomorrow's dose.   Gent peak 8/16 @ 1415: 8.7 Gent trough 8/16 @ 1240: 0.6  Goal of Therapy:  Gentamicin trough level <1 mcg/ml Gentamicin peak level 3-4 mcg/ml  Plan:  Continue gentamicin 100 mg IV q24hr Repeat gent peak tomorrow @ 1400 Watch renal function and cardiology plans for valve replacement  Cassie L. Nicole Kindred, PharmD Clinical Pharmacy Resident Pager: [phone] 07/19/2014 7:56 PM

## 2014-07-19 NOTE — Progress Notes (Signed)
SUBJECTIVE:  No complaints  OBJECTIVE:   Vitals:   Filed Vitals:   07/18/14 2313 07/19/14 0326 07/19/14 0328 07/19/14 0853  BP: 118/42  158/50 143/49  Pulse: 73  71 78  Temp: 98.5 F (36.9 C)  98.9 F (37.2 C) 97.9 F (36.6 C)  TempSrc: Oral  Oral Oral  Resp: 16  14 16   Height:      Weight:  199 lb 8.3 oz (90.5 kg)    SpO2: 94%  96% 100%   I&O's:   Intake/Output Summary (Last 24 hours) at 07/19/14 0857 Last data filed at 07/19/14 7564  Gross per 24 hour  Intake   1150 ml  Output   2625 ml  Net  -1475 ml   TELEMETRY: Reviewed telemetry pt in NSR:     PHYSICAL EXAM General: Well developed, well nourished, in no acute distress Head: Eyes PERRLA, No xanthomas.   Normal cephalic and atramatic  Lungs:   Clear bilaterally to auscultation and percussion. Heart:   HRRR S1 S2 Pulses are 2+ & equal. 2/6 SM at RUSB to LLSB Abdomen: Bowel sounds are positive, abdomen soft and non-tender without masses  Extremities:   2+ pitting edema Neuro: Alert and oriented X 3. Psych:  Good affect, responds appropriately   LABS: Basic Metabolic Panel:  Recent Labs  07/18/14 0500 07/19/14 0328  NA 139 137  K 4.1 4.1  CL 101 100  CO2 27 28  GLUCOSE 96 111*  BUN 23 25*  CREATININE 1.45* 1.53*  CALCIUM 8.4 8.4   Liver Function Tests:  Recent Labs  07/18/14 0500 07/19/14 0328  AST 41* 44*  ALT 34 37  ALKPHOS 62 69  BILITOT 0.6 0.6  PROT 6.3 6.9  ALBUMIN 2.3* 2.3*   No results found for this basename: LIPASE, AMYLASE,  in the last 72 hours CBC:  Recent Labs  07/18/14 0500 07/19/14 0328  WBC 7.8 7.8  HGB 10.0* 10.7*  HCT 31.8* 34.0*  MCV 105.3* 105.6*  PLT 54* 57*   Cardiac Enzymes: No results found for this basename: CKTOTAL, CKMB, CKMBINDEX, TROPONINI,  in the last 72 hours BNP: No components found with this basename: POCBNP,  D-Dimer: No results found for this basename: DDIMER,  in the last 72 hours Hemoglobin A1C:  Recent Labs  07/23/2014 0500    HGBA1C 5.1   Fasting Lipid Panel: No results found for this basename: CHOL, HDL, LDLCALC, TRIG, CHOLHDL, LDLDIRECT,  in the last 72 hours Thyroid Function Tests:  Recent Labs  07/14/2014 0500  TSH 4.750*   Anemia Panel: No results found for this basename: VITAMINB12, FOLATE, FERRITIN, TIBC, IRON, RETICCTPCT,  in the last 72 hours Coag Panel:   Lab Results  Component Value Date   INR 1.21 07/16/2014   INR 1.48 07/15/2014    RADIOLOGY: Ct Abdomen Pelvis Wo Contrast  07/09/2014   CLINICAL DATA:  Enterococcus bacteremia. Evaluate for abdominal source. Evaluate for potential tumor. Enterobacter is also present in the urine.  EXAM: CT ABDOMEN AND PELVIS WITHOUT CONTRAST  TECHNIQUE: Multidetector CT imaging of the abdomen and pelvis was performed following the standard protocol without IV contrast.  COMPARISON:  No priors.  FINDINGS: Lung Bases: Atherosclerotic calcifications in the right coronary artery. Pacemaker lead terminating in the right ventricular apex. Moderate-sized hiatal hernia. Moderate to large bilateral pleural effusions which appear to layer dependently and are associated with extensive passive atelectasis in the visualized portions of the lower lobes of the lungs bilaterally.  Abdomen/Pelvis: 9 mm  low attenuation lesion in segment 2 of the liver is incompletely characterized on today's noncontrast CT examination, but is unchanged, and favored to represent a tiny cyst. No other focal hepatic lesions are identified on today's non contrast CT examination. There is a tiny calcifications in the head of the pancreas (image 31 of series 2) which appears to be immediately posterior to the distal common bile duct, potentially related to prior episodes of pancreatitis. Other small calcifications are also noted in the head and tail of the pancreas as well. The calcification in the head of the pancreas is not favored to be within the common bile duct, as the common bile duct is normal in caliber  measuring 4 mm, and there is no intrahepatic biliary ductal dilatation to suggest obstruction. The unenhanced appearance of the gallbladder, spleen and bilateral adrenal glands is unremarkable. There is bilateral perinephric stranding which is nonspecific. 1.5 cm intermediate attenuation lesion extending exophytically off the lateral aspect of the upper pole of the right kidney is incompletely characterized on today's non contrast CT examination, but is statistically likely to represent a mildly proteinaceous cyst.  Normal appendix. Trace volume of ascites. No pneumoperitoneum. No pathologic distention of small bowel. 10 mm short axis inguinal lymph nodes bilaterally are nonspecific. No other definite lymphadenopathy identified in the abdomen or pelvis on today's non contrast CT examination. Mild diffuse mesenteric edema. Prostate gland is enlarged measuring 4.9 x 6.1 cm. Urinary bladder is generally unremarkable in appearance, however, the attenuation values in the urine are rather high ranging from 45-55 HU, suggesting proteinaceous or hemorrhagic contents.  Musculoskeletal: Mild diffuse body wall edema. There are no aggressive appearing lytic or blastic lesions noted in the visualized portions of the skeleton.  IMPRESSION: 1. No definite source for bacteremia identified on today's examination. 2. Relatively high attenuation of the urine is of uncertain etiology and significance, but may suggest some proteinaceous or hemorrhagic contents. 3. Findings suggestive of anasarca, including moderate to large bilateral pleural effusions, trace volume of ascites, mild diffuse mesenteric edema and diffuse body wall edema. 4. Prostatomegaly. 5. Additional incidental findings, as above.   Electronically Signed   By: Vinnie Langton M.D.   On: 07/09/2014 12:46   Dg Orthopantogram  07/15/2014   CLINICAL DATA:  Endocarditis.  EXAM: ORTHOPANTOGRAM/PANORAMIC  COMPARISON:  None.  FINDINGS: There are multiple missing teeth and  numerous metallic restorations. However, there is no evidence of a periapical abscess or osteomyelitis or other acute abnormality.  IMPRESSION: No periapical abscesses or other acute abnormalities.   Electronically Signed   By: Rozetta Nunnery M.D.   On: 07/15/2014 21:43   Dg Chest 2 View  07/15/2014   CLINICAL DATA:  Pacemaker extraction  EXAM: CHEST  2 VIEW  COMPARISON:  07/07/2014  FINDINGS: Cardiac pacer has been removed with generator and 2 leads absent. Mild interstitial change in peribronchial cuffing. These findings are less prominent when compared to the prior study. There is consolidation in the medial left lung base which appears most consistent with atelectasis.  IMPRESSION: 1. Mild residual interstitial change may reflect minimal remaining interstitial pulmonary edema 2. Left lower lobe atelectasis   Electronically Signed   By: Skipper Cliche M.D.   On: 07/15/2014 08:15   Dg Chest 2 View  07/20/2014   CLINICAL DATA:  Fever, chills, shortness of breath.  EXAM: CHEST  2 VIEW  COMPARISON:  06/10/2014  FINDINGS: Left pacer in place with leads in the right atrium and right ventricle. Heart is normal  size. Mild peribronchial thickening. No confluent airspace opacities or effusions. No acute bony abnormality.  IMPRESSION: Bronchitic changes.   Electronically Signed   By: Rolm Baptise M.D.   On: 07/09/2014 15:07   Ct Head Wo Contrast  07/09/2014   CLINICAL DATA:  Hallucinations.  Thrombocytopenia  EXAM: CT HEAD WITHOUT CONTRAST  TECHNIQUE: Contiguous axial images were obtained from the base of the skull through the vertex without intravenous contrast.  COMPARISON:  None.  FINDINGS: Mild atrophy. Negative for hemorrhage. Negative for acute infarct or mass. Calvarium intact.  IMPRESSION: No acute abnormality.   Electronically Signed   By: Franchot Gallo M.D.   On: 07/09/2014 18:47   Ct Angio Chest W/cm &/or Wo Cm  07/19/2014   CLINICAL DATA:  Shortness of breath and fever  EXAM: CT ANGIOGRAPHY CHEST WITH  CONTRAST  TECHNIQUE: Multidetector CT imaging of the chest was performed using the standard protocol during bolus administration of intravenous contrast. Multiplanar CT image reconstructions and MIPs were obtained to evaluate the vascular anatomy.  CONTRAST:  12mL OMNIPAQUE IOHEXOL 350 MG/ML SOLN  COMPARISON:  Chest radiograph July 06, 2014  FINDINGS: There is no demonstrable pulmonary embolus. There is no thoracic aortic aneurysm or dissection.  There are free-flowing pleural effusions bilaterally. There is hazy ground-glass opacity diffusely.  On axial CT slice 55 series 6, there is a 6 x 4 mm nodular opacity in the medial segment of the right middle lobe. On axial slice 64 series 6, there is a 6 x 4 mm nodular opacity in the lateral segment of the left lower lobe. On axial slice 62 series 6, there is a 7 x 5 mm nodular opacity in the lateral segment of the right middle lobe. On axial slice 69 series 6, there is a 5 x 4 mm nodular opacity in the superior segment of the right lower lobe.  There is no appreciable thoracic adenopathy. Pericardium is not thickened. Pacemaker leads are attached to the right atrium and right ventricle. There is a focal hiatal hernia.  Visualized upper abdominal structures appear unremarkable. There are no blastic or lytic bone lesions. There is degenerative change in the thoracic spine. Thyroid appears unremarkable.  Review of the MIP images confirms the above findings.  IMPRESSION: No demonstrable pulmonary embolus.  Bilateral effusions with extensive ground-glass opacity bilaterally. Suspect alveolar edema with findings consistent with congestive heart failure. Atypical infection or allergic type phenomenon are differential considerations, however.  Several nodular opacities, largest measuring 7 mm. Followup of these nodular opacity should be based on Fleischner Society guidelines. If the patient is at high risk for bronchogenic carcinoma, follow-up chest CT at 3-87months is  recommended. If the patient is at low risk for bronchogenic carcinoma, follow-up chest CT at 6-12 months is recommended. This recommendation follows the consensus statement: Guidelines for Management of Small Pulmonary Nodules Detected on CT Scans: A Statement from the [place] as published in Radiology 2005; 237:395-400.  Focal hiatal hernia.  No adenopathy.   Electronically Signed   By: Lowella Grip M.D.   On: 07/24/2014 18:09   Dg Chest Port 1 View  07/16/2014   CLINICAL DATA:  Line placement  EXAM: PORTABLE CHEST - 1 VIEW  COMPARISON:  None.  FINDINGS: Right-sided PICC line with the tip projecting over the SVC.  Small left pleural effusion. Trace right pleural effusion with right basilar airspace disease which may reflect atelectasis versus pneumonia. No pneumothorax.  Stable cardiomediastinal silhouette. Thoracic aortic atherosclerosis. Unremarkable osseous structures.  IMPRESSION:  1. Right-sided PICC line with the tip projecting over the SVC. 2. Bilateral small pleural effusions. Right basilar airspace disease which may reflect atelectasis versus pneumonia.   Electronically Signed   By: Kathreen Devoid   On: 07/16/2014 16:00   Dg Chest Port 1 View  07/07/2014   CLINICAL DATA:  Evaluate airspace.  EXAM: PORTABLE CHEST - 1 VIEW  COMPARISON:  CT 07/05/2014.  Chest x-ray 07/20/2014 and 06/10/2014.  FINDINGS: Cardiac pacer with lead tips in right atrium and right ventricle. Mediastinum and hilar structures are unremarkable. Cardiomegaly with mild pulmonary vascular prominence and interstitial prominence consistent with congestive heart failure. Small pleural effusions. No pneumothorax. Reference is made to recent chest CT which demonstrated small pulmonary nodules. These are not definitely identified by chest x-ray. No acute bony abnormality.  IMPRESSION: 1. Persistent changes of congestive heart failure pulmonary edema. Small pleural effusions. 2. Reference is made to recent chest CT which  demonstrated small pulmonary nodules. Follow-up is recommended as on prior CT report.   Electronically Signed   By: Marcello Moores  Register   On: 07/07/2014 07:26   Assessment/Plan:  1 subacute bacterial endocarditis-enterococcus on initial blood cultures with most recent cultures negative. Patient afebrile. Continue ampicillin and gentamicin. Infectious disease following. Transesophageal echocardiogram showed aortic valve vegetation with moderate aortic insufficiency. There was also severe mitral regurgitation. Patient will need aortic valve replacement and either mitral valve replacement or mitral valve repair. His pacemaker has been removed. Cardiac catheterization showed nonobstructive distal LM, widely patent LAD and stent in mid vessel, severely disease small to moderate sized diagonal, heavily calcified ostial circ with 70% stenosis, 50-70% stenosis of a small OM2, widely patent RCA with moderate irregularities. Plan for AVR/MVR vs repair this week. 2 history of paroxysmal atrial fibrillation with post-termination pauses -anticoagulation is on hold given thrombocytopenia. Pacemaker has been removed. Telemetry has shown some brief episodes of paroxysmal atrial fibrillation but now back in NSR. Will require Pacemaker replacement once he recovers from valve surgery. No anticoagulation at present due to thrombocytopenia.  3 stage III chronic kidney disease-follow renal function closely given use of gentamicin. 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 Hematology following and managing. Plt count up to 56K on steroids 5 hypertension-given aortic insufficiency and mitral regurgitation need to have blood pressure well controlled. We will avoid beta blockade given history of bradycardia. Avoid ACE inhibitors given renal insufficiency. Continue Amlodipine. BP fairly well controlled. 6 chronic diastolic heart failure-patient is volume overloaded on examination. His heart failure is most likely  from his mitral regurgitation and aortic insufficiency. Continue PO lasix.   Justin Margarita, MD  07/19/2014  8:57 AM

## 2014-07-20 ENCOUNTER — Ambulatory Visit: Payer: Medicare Other | Admitting: Hematology & Oncology

## 2014-07-20 ENCOUNTER — Inpatient Hospital Stay (HOSPITAL_COMMUNITY): Payer: Medicare Other

## 2014-07-20 ENCOUNTER — Other Ambulatory Visit: Payer: Medicare Other | Admitting: Lab

## 2014-07-20 DIAGNOSIS — I080 Rheumatic disorders of both mitral and aortic valves: Secondary | ICD-10-CM

## 2014-07-20 LAB — PULMONARY FUNCTION TEST
FEF 25-75 Post: 1 L/sec
FEF 25-75 Pre: 0.8 L/sec
FEF2575-%Change-Post: 24 %
FEF2575-%Pred-Post: 47 %
FEF2575-%Pred-Pre: 38 %
FEV1-%Change-Post: 5 %
FEV1-%Pred-Post: 57 %
FEV1-%Pred-Pre: 54 %
FEV1-Post: 1.73 L
FEV1-Pre: 1.65 L
FEV1FVC-%Change-Post: 12 %
FEV1FVC-%Pred-Pre: 93 %
FEV6-%Change-Post: -2 %
FEV6-%Pred-Post: 57 %
FEV6-%Pred-Pre: 59 %
FEV6-Post: 2.28 L
FEV6-Pre: 2.33 L
FEV6FVC-%Change-Post: 4 %
FEV6FVC-%Pred-Post: 106 %
FEV6FVC-%Pred-Pre: 101 %
FVC-%Change-Post: -6 %
FVC-%Pred-Post: 53 %
FVC-%Pred-Pre: 57 %
FVC-Post: 2.28 L
FVC-Pre: 2.44 L
Post FEV1/FVC ratio: 76 %
Post FEV6/FVC ratio: 100 %
Pre FEV1/FVC ratio: 68 %
Pre FEV6/FVC Ratio: 95 %

## 2014-07-20 LAB — GLUCOSE, CAPILLARY
GLUCOSE-CAPILLARY: 206 mg/dL — AB (ref 70–99)
Glucose-Capillary: 77 mg/dL (ref 70–99)
Glucose-Capillary: 103 mg/dL — ABNORMAL HIGH (ref 70–99)
Glucose-Capillary: 161 mg/dL — ABNORMAL HIGH (ref 70–99)

## 2014-07-20 LAB — LACTATE DEHYDROGENASE: LDH: 488 U/L — AB (ref 94–250)

## 2014-07-20 LAB — COMPREHENSIVE METABOLIC PANEL
ALT: 34 U/L (ref 0–53)
AST: 43 U/L — AB (ref 0–37)
Albumin: 2.2 g/dL — ABNORMAL LOW (ref 3.5–5.2)
Alkaline Phosphatase: 64 U/L (ref 39–117)
Anion gap: 9 (ref 5–15)
BUN: 27 mg/dL — ABNORMAL HIGH (ref 6–23)
CALCIUM: 8.3 mg/dL — AB (ref 8.4–10.5)
CO2: 29 meq/L (ref 19–32)
Chloride: 97 mEq/L (ref 96–112)
Creatinine, Ser: 1.57 mg/dL — ABNORMAL HIGH (ref 0.50–1.35)
GFR calc Af Amer: 47 mL/min — ABNORMAL LOW (ref >90)
GFR calc non Af Amer: 40 mL/min — ABNORMAL LOW (ref >90)
Glucose, Bld: 95 mg/dL (ref 70–99)
Potassium: 4 mEq/L (ref 3.7–5.3)
SODIUM: 135 meq/L — AB (ref 137–147)
Total Bilirubin: 0.5 mg/dL (ref 0.3–1.2)
Total Protein: 7 g/dL (ref 6.0–8.3)

## 2014-07-20 LAB — CBC
HCT: 33.4 % — ABNORMAL LOW (ref 39.0–52.0)
Hemoglobin: 10.4 g/dL — ABNORMAL LOW (ref 13.0–17.0)
MCH: 32.8 pg (ref 26.0–34.0)
MCHC: 31.1 g/dL (ref 30.0–36.0)
MCV: 105.4 fL — ABNORMAL HIGH (ref 78.0–100.0)
Platelets: 59 10*3/uL — ABNORMAL LOW (ref 150–400)
RBC: 3.17 MIL/uL — AB (ref 4.22–5.81)
RDW: 23.5 % — ABNORMAL HIGH (ref 11.5–15.5)
WBC: 6.4 10*3/uL (ref 4.0–10.5)

## 2014-07-20 LAB — HEPARIN INDUCED THROMBOCYTOPENIA PNL
Heparin Induced Plt Ab: NEGATIVE
Patient O.D.: 0.134
UFH High Dose UFH H: 0 % Release
UFH Low Dose 0.1 IU/mL: 0 % Release
UFH Low Dose 0.5 IU/mL: 0 % Release
UFH SRA Result: NEGATIVE

## 2014-07-20 LAB — GENTAMICIN LEVEL, PEAK
Gentamicin Pk: 0.7 ug/mL — ABNORMAL LOW (ref 5.0–10.0)
Gentamicin Pk: 3.2 ug/mL — ABNORMAL LOW (ref 5.0–10.0)

## 2014-07-20 MED ORDER — PREDNISONE 20 MG PO TABS
20.0000 mg | ORAL_TABLET | Freq: Every day | ORAL | Status: DC
  Administered 2014-07-21: 20 mg via ORAL
  Filled 2014-07-20: qty 1

## 2014-07-20 MED ORDER — ROMIPLOSTIM 250 MCG ~~LOC~~ SOLR
3.0000 ug/kg | Freq: Once | SUBCUTANEOUS | Status: AC
  Administered 2014-07-20: 270 ug via SUBCUTANEOUS
  Filled 2014-07-20: qty 0.54

## 2014-07-20 MED ORDER — FUROSEMIDE 20 MG PO TABS
60.0000 mg | ORAL_TABLET | Freq: Two times a day (BID) | ORAL | Status: DC
  Administered 2014-07-20 – 2014-07-21 (×2): 60 mg via ORAL
  Filled 2014-07-20 (×4): qty 1

## 2014-07-20 MED ORDER — PREDNISONE 20 MG PO TABS
30.0000 mg | ORAL_TABLET | Freq: Every day | ORAL | Status: DC
  Administered 2014-07-20: 30 mg via ORAL

## 2014-07-20 MED ORDER — AMLODIPINE BESYLATE 10 MG PO TABS
10.0000 mg | ORAL_TABLET | Freq: Every day | ORAL | Status: DC
  Administered 2014-07-21: 10 mg via ORAL
  Filled 2014-07-20 (×2): qty 1

## 2014-07-20 MED ORDER — ALBUTEROL SULFATE (2.5 MG/3ML) 0.083% IN NEBU
2.5000 mg | INHALATION_SOLUTION | Freq: Once | RESPIRATORY_TRACT | Status: AC
  Administered 2014-07-20: 2.5 mg via RESPIRATORY_TRACT

## 2014-07-20 NOTE — Progress Notes (Signed)
Mr. Panning did well over the weekend. He received IVIG. He has cardiac cath on Friday. There is a looked pretty good. There was 1 coronary artery narrowing that may or may not need to be fixed.  His platelet count continues to go up. He was 59,000 today. I'll go ahead and give him a dose of Nplate just to try to get his platelet count up further.  He is still scheduled for surgery on Wednesday.  He's been well. He is out of bed more.  His hemoglobin is 10.4. I don't think we have to give him any Aranesp for this.  His vital signs all stable. Blood pressure is 152/44. Temperature is 98. His lungs are clear. Cardiac exam irregular rate and rhythm. Has an occasional extra beat. He is a 1/6 systolic murmur. Abdomen is soft. Has good bowel sounds. There is no fluid wave. There is no palpable liver or spleen tip. Back exam shows no tenderness over the spine ribs or hips. Extremities shows no clubbing cyanosis or edema. Skin exam shows no petechia. He is some scattered ecchymoses.  Again, his platelet count is coming up nicely. He received IVIG. He is on prednisone. I will decrease his prednisone dose down to 30 mg. I will give him another dose of Nplate today.  I really think that he should have minimal bleeding problems with the surgery, Wednesday.  I think that with improvement in his enterococcal infection, his blood counts are improving.  Lum Keas  1 Peter 5:10

## 2014-07-20 NOTE — Progress Notes (Signed)
Physical Therapy Treatment Patient Details Name: ABIMAEL ZEITER MRN: [id number] DOB: [birth year] Today's Date: 07/20/2014    History of Present Illness Pt is a 80 y.o. male with history of CAD status post stents, hypertension, dyslipidemia, a-fib, pacemaker, ITP currently being treated with IVIG, who presents to the emergency department with complaints of fever and shortness of breath that started 8/2. In ED found to have leukocytosis, hypotension, and lactic acidosis. Transferred to MICU for suspected sepsis.     PT Comments    Pt progressing towards physical therapy goals. Would like to gait train without AD more next session and focus on more standing therapeutic exercise for balance training. Encouraged pt to ambulate with staff at least once more today and to do HEP at least twice more today.   Follow Up Recommendations  Home health PT;Supervision for mobility/OOB     Equipment Recommendations  Rolling walker with 5" wheels    Recommendations for Other Services       Precautions / Restrictions Precautions Precautions: Fall Restrictions Weight Bearing Restrictions: No    Mobility  Bed Mobility               General bed mobility comments: Pt received sitting in the recliner  Transfers Overall transfer level: Needs assistance Equipment used: None Transfers: Sit to/from Stand Sit to Stand: Supervision         General transfer comment: VC's for general safety awareness. No physical assist required.   Ambulation/Gait Ambulation/Gait assistance: Supervision Ambulation Distance (Feet): 450 Feet Assistive device: Rolling walker (2 wheeled) Gait Pattern/deviations: Step-through pattern;Decreased stride length;Trunk flexed Gait velocity: Decreased Gait velocity interpretation: Below normal speed for age/gender General Gait Details: Pt prefers ambulating with the RW. Was able to improve ambulation distance without reported SOB or fatigue.    Stairs             Wheelchair Mobility    Modified Rankin (Stroke Patients Only)       Balance Overall balance assessment: Needs assistance Sitting-balance support: Feet supported Sitting balance-Leahy Scale: Good     Standing balance support: No upper extremity supported Standing balance-Leahy Scale: Fair                      Cognition Arousal/Alertness: Awake/alert Behavior During Therapy: WFL for tasks assessed/performed Overall Cognitive Status: Within Functional Limits for tasks assessed                      Exercises General Exercises - Lower Extremity Long Arc Quad: 15 reps Straight Leg Raises: Standing;15 reps Mini-Sqauts: 10 reps;Standing    General Comments        Pertinent Vitals/Pain Pain Assessment: No/denies pain    Home Living                      Prior Function            PT Goals (current goals can now be found in the care plan section) Acute Rehab PT Goals Patient Stated Goal: To return home with wife PT Goal Formulation: With patient/family Time For Goal Achievement: 07/27/14 Potential to Achieve Goals: Good Progress towards PT goals: Progressing toward goals    Frequency  Min 3X/week    PT Plan Current plan remains appropriate    Co-evaluation             End of Session Equipment Utilized During Treatment: Gait belt Activity Tolerance: Patient tolerated treatment well Patient left: with  call bell/phone within reach;in chair     Time: 1006-1031 PT Time Calculation (min): 25 min  Charges:  $Gait Training: 8-22 mins $Therapeutic Exercise: 8-22 mins                    G Codes:      Jolyn Lent 08-10-14, 1:21 PM  Jolyn Lent, PT, DPT Acute Rehabilitation Services Pager: [phone]

## 2014-07-20 NOTE — Progress Notes (Signed)
      [address]       [place] [address]             [phone]     CARDIOTHORACIC SURGERY PROGRESS NOTE  3 Days Post-Op  S/P Procedure(s) (LRB): CORONARY ANGIOGRAM (N/A)  Subjective: Patient reports feeling better every day  Objective: Vital signs in last 24 hours: Temp:  [97.4 F (36.3 C)-98.7 F (37.1 C)] 97.8 F (36.6 C) (08/17 1653) Pulse Rate:  [61-84] 75 (08/17 1653) Cardiac Rhythm:  [-] Heart block (08/17 1653) Resp:  [14-22] 15 (08/17 1653) BP: (107-152)/(33-71) 135/40 mmHg (08/17 1653) SpO2:  [96 %-100 %] 96 % (08/17 1653) Weight:  [89.6 kg (197 lb 8.5 oz)] 89.6 kg (197 lb 8.5 oz) (08/17 0410)  Physical Exam:  Rhythm:   sinus  Breath sounds: clear  Heart sounds:  RRR  Incisions:  n/a  Abdomen:  soft  Extremities:  warm   Intake/Output from previous day: 08/16 0701 - 08/17 0700 In: 1160 [P.O.:660; I.V.:350; IV Piggyback:150] Out: 2023 [Urine:1620] Intake/Output this shift: Total I/O In: 1110 [P.O.:840; I.V.:20; IV Piggyback:250] Out: 1175 [Urine:1175]  Lab Results:  Recent Labs  07/19/14 0328 07/20/14 0300  WBC 7.8 6.4  HGB 10.7* 10.4*  HCT 34.0* 33.4*  PLT 57* 59*   BMET:  Recent Labs  07/19/14 0328 07/20/14 0300  NA 137 135*  K 4.1 4.0  CL 100 97  CO2 28 29  GLUCOSE 111* 95  BUN 25* 27*  CREATININE 1.53* 1.57*  CALCIUM 8.4 8.3*    CBG (last 3)   Recent Labs  07/20/14 0736 07/20/14 1149 07/20/14 1655  GLUCAP 77 103* 206*   PT/INR:  No results found for this basename: LABPROT, INR,  in the last 72 hours  CXR:  N/A  Assessment/Plan: S/P Procedure(s) (LRB): CORONARY ANGIOGRAM (N/A)  I have reviewed Mr Maloney's recent echocardiograms, cath, lab work and CT scans.  Situation discussed at length with Drs Stanford Breed and Marin Olp.  I have discussed the indications, risks and potential benefits of surgery over at length with Mr Jian and his wife this evening.  Alternative treatment strategies reviewed including  continue medical (antibiotic) therapy with or without surgery at a delayed date.  All questions answered.  We tentatively plan for surgery on Friday 8/21 for AVR, MVrepair, CABG and possible maze procedure.  I favor tapering Prednisone as much as possible and watching platelet count.  If it drops further we can consider platelet transfusion prior to surgery.  Will continue to follow closely.   I spent in excess of 45 minutes during the conduct of this hospital encounter and >50% of this time involved direct face-to-face encounter with the patient for counseling and/or coordination of their care.   Lupe Bonner H 07/20/2014 6:20 PM

## 2014-07-20 NOTE — Progress Notes (Addendum)
TCTS BRIEF PROGRESS NOTE   Patient seen and examined, chart reviewed.  Case discussed w/ Dr Stanford Breed and Dr Prescott Gum.  Will discuss treatment of thrombocytopenia further with Dr Marin Olp.  Timing of surgery remains in question.  Full note to follow.  [name],Justin H 07/20/2014 9:22 AM

## 2014-07-20 NOTE — Progress Notes (Signed)
[facility]                                                                              Patient Demographics  Justin [name], is a 80 y.o. male, DOB - [birth year], OHY:[id number]  Admit date - 07/09/2014   Admitting Physician Rigoberto Noel, MD  Outpatient Primary MD for the patient is Penni Homans, MD  LOS - 14   Chief Complaint  Patient presents with  . Shortness of Breath      HPI on 07/12/2014 80 y.o. male with history of coronary artery disease status post stents, hypertension, dyslipidemia, atrial fibrillation no longer on anticoagulation secondary to recent GI bleed, pacemaker, ITP currently being treated with IVIG by Dr. Marin Olp (most recently 8/3) and prednisone 80mg  daily, presented to the emergency department with complaints of fever and shortness of breath that started 8/2. In ED, patient found to have leukocytosis, hypotension, and lactic acidosis. Given 1L IVF bolus and started on levophed. Transferred to MICU for suspected sepsis.   Interim history Patient was hypotensive and required pressors. She was found to have septic shock with enterococcus bacteremia, infectious disease was consulted. TEE was recommended which showed a large aortic valve vegetation. 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 Patient was noted to have an episode of TIA, and neurology was consulted. Patient's pacemaker was removed on 07/10/2014. Cardiothoracic surgery was also consulted for further evaluation. Patient had PICC placed on 07/16/14.  He will have a heart cath 07/08/2014.  Assessment & Plan   Enterococcus Bacteremia with Aortic Valve Vegetation:  -Received IV vancomycin for 4 days. Repeated Blood Culture 8-5 no growth to date. WBC trending down. -CT abdomen no mas.  -S/P TEE which showed Large Aortic Valve Vegetation.  -Has PICC line, double lumen. -Vancomycin was changed to ampicillin 8-8.  Gentamycin added on  8-9.  -HIV non reactive, viral hepatitis negative. .  -Cardiothoracic surgery also consulted, possible surgery on 07/22/2014 -Heart cath on 07/16/2014 showed: nonobstructive distal LM, widely patent LAD and stent in mid vessel, severely disease small to moderate sized diagonal, heavily calcified ostial circ with 70% stenosis, 50-70% stenosis of a small OM2, widely patent RCA with moderate irregularities.   Mental status/Transient Hallucination -Occurred on 07/09/2014, resolved -May have been secondary to medications versus delirium -CT of the head was negative  Transient hand weakness, numbness, likely TIA -Carotid Duplex: Bilateral: 1-39% ICA stenosis. Vertebral artery flow is antegrade. Right subclavian artery demonstrates significant stenosis compared to left subclavian artery. Medical management for subclavian artery stenosis.  -Hemoglobin A1c 5.6 -Continue with Lipitor. Follow LFT -TEE showed Aortic valve vegetation, treatment and plan as above -Neurology has signed off  Possible pneumonia vs TRALI from IVIG  -Bilateral effusions - stable   Diarrhea -Resolved.   Elevated troponin -in setting sepsis.  -ECHO normal Wall motion.  -Cardiology following, patient is high-risk for anticoagulation due to to GI bleed  Enterobacter Urine:  -30,000 colonies.   Chronic diastolic heart failure - on lasix at home  -Continue lasix (80 mg ) on 8-5.  -Possibly secondary to aortic insufficiency and mitral regurg -Dyspnea resolved -Cardiology following,  continue lasix per cardiology recommendation -Heart cath 07/05/2014 -Avoid ACEI due to AKI. -has good urine output  Severe Sepsis/Septic Shock -Secondary to Enterococcus bacteremia.   -Septic shock resolved, patient did require use of pressors, Levophed -Continue antibiotics as stated above -Blood pressure currently stable  Acute kidney injury -in setting of infection. Monitor on Lasix. Good urine output.  -Continue  to monitor creatinine  Thrombocytopenia,  -History of ITP, first IVIG treatment 8/3, on chronic prednisone.  -Platelets stable, 59 -Dr. Marin Olp following and appreciated -Received one dose of Nplate and received epogen on 8/8 -Given a unit of platelets 07/08/2014. Will continue to monitor. -Patient may need additional units before surgery.  Suspected Adrenal insufficiency ( chronic steroids )  -Continue with prednisone.   History of paroxysmal atrial fibrillation  -Currently back in sinus.  -Pacemaker was removed, however per cardiology notes, patient will need placement of pacemaker post surgery.  -No anticoagulation at this time due to patient's thrombocytopenia.   Hypertension -Fairly controlled, continue amlodipine and lasix  Nasal congestion -Improving, continue Flonase  Code Status: Full  Family Communication: Family at bedside  Disposition Plan: Admitted, pending AV replacement.  Time Spent in minutes   25 minutes  Procedures 6/24 TTE >>> LVEF 55%, Grade 2 DD  8/3 CT chest >>> bilateral effusions, bilateral GGO, several nodular opacities  8/4 ECHO >>> Mild to mod MR, EF 50-55%, G2 Dia Dys Fcn  TEE: normal LV function; severe MR; large aortic valve vegetation with moderate AI; no pacemaker vegetation identified 8/8 Carotid doppler: Bilateral 1% to 39% ICA stenosis. There is a 50% to 99% right subclavian stenosis more proximal to the innominate artery based on turbulence . The left subclavian flow appears normal. Bilateral vertebral artery flow is antegrade. 8/11 Pacemaker extraction 8/13 PICC line placed 8/14 Heart cath>>nonobstructive distal LM, widely patent LAD and stent in mid vessel, severely disease small to moderate sized diagonal, heavily calcified ostial circ with 70% stenosis, 50-70% stenosis of a small OM2, widely patent RCA with moderate irregularities.  8/16 Doppler (preop): Doppler waveforms remained normal with both radial and ulnar compressions on the  right. Left Doppler waveform reversed with radial compression and remained normal with ulnar compression.   Consults   Cardiology Cardiothoracic surgery Neurology Infectious disease Oncology Dental  DVT Prophylaxis  SCDs  Lab Results  Component Value Date   PLT 59* 07/20/2014    Medications  Scheduled Meds: . amLODipine  7.5 mg Oral Daily  . ampicillin (OMNIPEN) IV  2 g Intravenous 4 times per day  . atorvastatin  10 mg Oral QPM  . chlorhexidine  15 mL Mouth/Throat BID  . feeding supplement (ENSURE COMPLETE)  237 mL Oral TID WC  . fluticasone  1 spray Each Nare Daily  . furosemide  80 mg Oral Daily  . gentamicin  100 mg Intravenous Q24H  . insulin aspart  0-9 Units Subcutaneous TID WC  . pantoprazole  40 mg Oral Daily  . predniSONE  40 mg Oral Q breakfast  . pyridOXINE  100 mg Oral QPM  . sodium chloride  10-40 mL Intracatheter Q12H  . tamsulosin  0.4 mg Oral QPC supper  . vitamin E  400 Units Oral Daily   Continuous Infusions:  PRN Meds:.acetaminophen, ondansetron (ZOFRAN) IV, polyvinyl alcohol, sodium chloride, sodium chloride  Antibiotics    Anti-infectives   Start     Dose/Rate Route Frequency Ordered Stop   07/16/14 1300  gentamicin (GARAMYCIN) IVPB 100 mg     100 mg 200  mL/hr over 30 Minutes Intravenous Every 24 hours 07/15/14 1917     07/30/2014 1045  gentamicin (GARAMYCIN) 80 mg in sodium chloride irrigation 0.9 % 500 mL irrigation  Status:  Discontinued     80 mg Irrigation On call 07/20/2014 1031 07/28/2014 1805   07/12/14 1300  gentamicin (GARAMYCIN) IVPB 80 mg  Status:  Discontinued     80 mg 100 mL/hr over 30 Minutes Intravenous Every 24 hours 07/12/14 1200 07/15/14 1917   07/11/14 1200  ampicillin (OMNIPEN) 2 g in sodium chloride 0.9 % 50 mL IVPB     2 g 150 mL/hr over 20 Minutes Intravenous 4 times per day 07/11/14 0945     07/07/14 0030  piperacillin-tazobactam (ZOSYN) IVPB 3.375 g  Status:  Discontinued     3.375 g 12.5 mL/hr over 240 Minutes  Intravenous Every 8 hours 07/23/2014 2042 07/08/14 1602   07/18/2014 2300  levofloxacin (LEVAQUIN) IVPB 750 mg  Status:  Discontinued     750 mg 100 mL/hr over 90 Minutes Intravenous Every 48 hours 07/08/2014 2232 07/08/14 1022   07/22/2014 1615  piperacillin-tazobactam (ZOSYN) IVPB 3.375 g     3.375 g 100 mL/hr over 30 Minutes Intravenous  Once 07/27/2014 1604 07/07/2014 1755   08/02/2014 1615  vancomycin (VANCOCIN) IVPB 1000 mg/200 mL premix     1,000 mg 200 mL/hr over 60 Minutes Intravenous  Once 07/15/2014 1604 07/12/2014 1930   07/24/2014 0030  vancomycin (VANCOCIN) IVPB 750 mg/150 ml premix  Status:  Discontinued     750 mg 150 mL/hr over 60 Minutes Intravenous Every 12 hours 07/04/2014 2042 07/11/14 0945        Subjective:   Vickey Sages seen and examined today.  Patient states his nasal congestion is improving.  He was able to walk around the unit yesterday and hopes to do so again today.  He has no complaints this morning, denies chest pain, dizziness, headache, SOB, abdominal pain.  Objective:   Filed Vitals:   07/20/14 0007 07/20/14 0010 07/20/14 0410 07/20/14 0414  BP:  147/50  152/44  Pulse:  76  62  Temp: 98.5 F (36.9 C)  98 F (36.7 C)   TempSrc: Oral  Oral   Resp:  19  20  Height:      Weight:   89.6 kg (197 lb 8.5 oz)   SpO2:  100%  98%    Wt Readings from Last 3 Encounters:  07/20/14 89.6 kg (197 lb 8.5 oz)  07/20/14 89.6 kg (197 lb 8.5 oz)  07/20/14 89.6 kg (197 lb 8.5 oz)     Intake/Output Summary (Last 24 hours) at 07/20/14 5681 Last data filed at 07/20/14 0600  Gross per 24 hour  Intake   1160 ml  Output   1620 ml  Net   -460 ml    Exam  General: Well developed, well nourished, NAD, appears stated age  HEENT: NCAT, mucous membranes moist.   Cardiovascular: S1 S2 auscultated, Regular, 1/6 SEM  Respiratory: Clear to ascultation bilaterally, scattered wheezing  Abdomen: Soft, nontender, nondistended, + bowel sounds  Extremities: warm dry without cyanosis  clubbing. + 2 edema in LE B/L  Neuro: AAOx3, no focal deficits  Psych: Normal affect and demeanor with intact judgement and insight  Data Review   Micro Results Recent Results (from the past 240 hour(s))  FECAL OCCULT BLOOD, IMMUNOCHEMICAL     Status: None   Collection Time    07/13/14  5:24 PM      Result  Value Ref Range Status   Fecal Occult Bld Negative  Negative Final  MRSA PCR SCREENING     Status: None   Collection Time    07/21/2014  6:17 PM      Result Value Ref Range Status   MRSA by PCR NEGATIVE  NEGATIVE Final   Comment:            The GeneXpert MRSA Assay (FDA     approved for NASAL specimens     only), is one component of a     comprehensive MRSA colonization     surveillance program. It is not     intended to diagnose MRSA     infection nor to guide or     monitor treatment for     MRSA infections.    Radiology Reports Ct Abdomen Pelvis Wo Contrast  07/09/2014   CLINICAL DATA:  Enterococcus bacteremia. Evaluate for abdominal source. Evaluate for potential tumor. Enterobacter is also present in the urine.  EXAM: CT ABDOMEN AND PELVIS WITHOUT CONTRAST  TECHNIQUE: Multidetector CT imaging of the abdomen and pelvis was performed following the standard protocol without IV contrast.  COMPARISON:  No priors.  FINDINGS: Lung Bases: Atherosclerotic calcifications in the right coronary artery. Pacemaker lead terminating in the right ventricular apex. Moderate-sized hiatal hernia. Moderate to large bilateral pleural effusions which appear to layer dependently and are associated with extensive passive atelectasis in the visualized portions of the lower lobes of the lungs bilaterally.  Abdomen/Pelvis: 9 mm low attenuation lesion in segment 2 of the liver is incompletely characterized on today's noncontrast CT examination, but is unchanged, and favored to represent a tiny cyst. No other focal hepatic lesions are identified on today's non contrast CT examination. There is a tiny  calcifications in the head of the pancreas (image 31 of series 2) which appears to be immediately posterior to the distal common bile duct, potentially related to prior episodes of pancreatitis. Other small calcifications are also noted in the head and tail of the pancreas as well. The calcification in the head of the pancreas is not favored to be within the common bile duct, as the common bile duct is normal in caliber measuring 4 mm, and there is no intrahepatic biliary ductal dilatation to suggest obstruction. The unenhanced appearance of the gallbladder, spleen and bilateral adrenal glands is unremarkable. There is bilateral perinephric stranding which is nonspecific. 1.5 cm intermediate attenuation lesion extending exophytically off the lateral aspect of the upper pole of the right kidney is incompletely characterized on today's non contrast CT examination, but is statistically likely to represent a mildly proteinaceous cyst.  Normal appendix. Trace volume of ascites. No pneumoperitoneum. No pathologic distention of small bowel. 10 mm short axis inguinal lymph nodes bilaterally are nonspecific. No other definite lymphadenopathy identified in the abdomen or pelvis on today's non contrast CT examination. Mild diffuse mesenteric edema. Prostate gland is enlarged measuring 4.9 x 6.1 cm. Urinary bladder is generally unremarkable in appearance, however, the attenuation values in the urine are rather high ranging from 45-55 HU, suggesting proteinaceous or hemorrhagic contents.  Musculoskeletal: Mild diffuse body wall edema. There are no aggressive appearing lytic or blastic lesions noted in the visualized portions of the skeleton.  IMPRESSION: 1. No definite source for bacteremia identified on today's examination. 2. Relatively high attenuation of the urine is of uncertain etiology and significance, but may suggest some proteinaceous or hemorrhagic contents. 3. Findings suggestive of anasarca, including moderate to  large bilateral pleural effusions,  trace volume of ascites, mild diffuse mesenteric edema and diffuse body wall edema. 4. Prostatomegaly. 5. Additional incidental findings, as above.   Electronically Signed   By: Vinnie Langton M.D.   On: 07/09/2014 12:46   Dg Chest 2 View  07/15/2014   CLINICAL DATA:  Pacemaker extraction  EXAM: CHEST  2 VIEW  COMPARISON:  07/07/2014  FINDINGS: Cardiac pacer has been removed with generator and 2 leads absent. Mild interstitial change in peribronchial cuffing. These findings are less prominent when compared to the prior study. There is consolidation in the medial left lung base which appears most consistent with atelectasis.  IMPRESSION: 1. Mild residual interstitial change may reflect minimal remaining interstitial pulmonary edema 2. Left lower lobe atelectasis   Electronically Signed   By: Skipper Cliche M.D.   On: 07/15/2014 08:15   Dg Chest 2 View  07/22/2014   CLINICAL DATA:  Fever, chills, shortness of breath.  EXAM: CHEST  2 VIEW  COMPARISON:  06/10/2014  FINDINGS: Left pacer in place with leads in the right atrium and right ventricle. Heart is normal size. Mild peribronchial thickening. No confluent airspace opacities or effusions. No acute bony abnormality.  IMPRESSION: Bronchitic changes.   Electronically Signed   By: Rolm Baptise M.D.   On: 08/02/2014 15:07   Ct Head Wo Contrast  07/09/2014   CLINICAL DATA:  Hallucinations.  Thrombocytopenia  EXAM: CT HEAD WITHOUT CONTRAST  TECHNIQUE: Contiguous axial images were obtained from the base of the skull through the vertex without intravenous contrast.  COMPARISON:  None.  FINDINGS: Mild atrophy. Negative for hemorrhage. Negative for acute infarct or mass. Calvarium intact.  IMPRESSION: No acute abnormality.   Electronically Signed   By: Franchot Gallo M.D.   On: 07/09/2014 18:47   Ct Angio Chest W/cm &/or Wo Cm  07/12/2014   CLINICAL DATA:  Shortness of breath and fever  EXAM: CT ANGIOGRAPHY CHEST WITH CONTRAST   TECHNIQUE: Multidetector CT imaging of the chest was performed using the standard protocol during bolus administration of intravenous contrast. Multiplanar CT image reconstructions and MIPs were obtained to evaluate the vascular anatomy.  CONTRAST:  101mL OMNIPAQUE IOHEXOL 350 MG/ML SOLN  COMPARISON:  Chest radiograph July 06, 2014  FINDINGS: There is no demonstrable pulmonary embolus. There is no thoracic aortic aneurysm or dissection.  There are free-flowing pleural effusions bilaterally. There is hazy ground-glass opacity diffusely.  On axial CT slice 55 series 6, there is a 6 x 4 mm nodular opacity in the medial segment of the right middle lobe. On axial slice 64 series 6, there is a 6 x 4 mm nodular opacity in the lateral segment of the left lower lobe. On axial slice 62 series 6, there is a 7 x 5 mm nodular opacity in the lateral segment of the right middle lobe. On axial slice 69 series 6, there is a 5 x 4 mm nodular opacity in the superior segment of the right lower lobe.  There is no appreciable thoracic adenopathy. Pericardium is not thickened. Pacemaker leads are attached to the right atrium and right ventricle. There is a focal hiatal hernia.  Visualized upper abdominal structures appear unremarkable. There are no blastic or lytic bone lesions. There is degenerative change in the thoracic spine. Thyroid appears unremarkable.  Review of the MIP images confirms the above findings.  IMPRESSION: No demonstrable pulmonary embolus.  Bilateral effusions with extensive ground-glass opacity bilaterally. Suspect alveolar edema with findings consistent with congestive heart failure. Atypical infection or  allergic type phenomenon are differential considerations, however.  Several nodular opacities, largest measuring 7 mm. Followup of these nodular opacity should be based on Fleischner Society guidelines. If the patient is at high risk for bronchogenic carcinoma, follow-up chest CT at 3-70months is recommended. If the  patient is at low risk for bronchogenic carcinoma, follow-up chest CT at 6-12 months is recommended. This recommendation follows the consensus statement: Guidelines for Management of Small Pulmonary Nodules Detected on CT Scans: A Statement from the [employer] as published in Radiology 2005; 237:395-400.  Focal hiatal hernia.  No adenopathy.   Electronically Signed   By: Lowella Grip M.D.   On: 07/16/2014 18:09   Dg Chest Port 1 View  07/07/2014   CLINICAL DATA:  Evaluate airspace.  EXAM: PORTABLE CHEST - 1 VIEW  COMPARISON:  CT 07/24/2014.  Chest x-ray 07/21/2014 and 06/10/2014.  FINDINGS: Cardiac pacer with lead tips in right atrium and right ventricle. Mediastinum and hilar structures are unremarkable. Cardiomegaly with mild pulmonary vascular prominence and interstitial prominence consistent with congestive heart failure. Small pleural effusions. No pneumothorax. Reference is made to recent chest CT which demonstrated small pulmonary nodules. These are not definitely identified by chest x-ray. No acute bony abnormality.  IMPRESSION: 1. Persistent changes of congestive heart failure pulmonary edema. Small pleural effusions. 2. Reference is made to recent chest CT which demonstrated small pulmonary nodules. Follow-up is recommended as on prior CT report.   Electronically Signed   By: Marcello Moores  Register   On: 07/07/2014 07:26    CBC  Recent Labs Lab 07/16/14 2200 07/06/2014 0500 07/18/14 0500 07/19/14 0328 07/20/14 0300  WBC 8.2 8.2 7.8 7.8 6.4  HGB 10.9* 10.4* 10.0* 10.7* 10.4*  HCT 34.6* 32.2* 31.8* 34.0* 33.4*  PLT 41* 36* 54* 57* 59*  MCV 103.9* 102.9* 105.3* 105.6* 105.4*  MCH 32.7 33.2 33.1 33.2 32.8  MCHC 31.5 32.3 31.4 31.5 31.1  RDW 23.8* 23.9* 24.3* 23.8* 23.5*    Chemistries   Recent Labs Lab 07/16/14 0357 07/16/14 2200 07/23/2014 0500 07/18/14 0500 07/19/14 0328 07/20/14 0300  NA 143 139 141 139 137 135*  K 3.9 4.3 3.7 4.1 4.1 4.0  CL 104 99 100 101 100 97    CO2 27 25 27 27 28 29   GLUCOSE 118* 157* 105* 96 111* 95  BUN 28* 29* 26* 23 25* 27*  CREATININE 1.40* 1.54* 1.47* 1.45* 1.53* 1.57*  CALCIUM 8.2* 8.1* 8.2* 8.4 8.4 8.3*  AST 41*  --  40* 41* 44* 43*  ALT 36  --  35 34 37 34  ALKPHOS 59  --  59 62 69 64  BILITOT 0.8  --  0.7 0.6 0.6 0.5   ------------------------------------------------------------------------------------------------------------------ estimated creatinine clearance is 40.6 ml/min (by C-G formula based on Cr of 1.57). ------------------------------------------------------------------------------------------------------------------ No results found for this basename: HGBA1C,  in the last 72 hours ------------------------------------------------------------------------------------------------------------------ No results found for this basename: CHOL, HDL, LDLCALC, TRIG, CHOLHDL, LDLDIRECT,  in the last 72 hours ------------------------------------------------------------------------------------------------------------------ No results found for this basename: TSH, T4TOTAL, FREET3, T3FREE, THYROIDAB,  in the last 72 hours ------------------------------------------------------------------------------------------------------------------ No results found for this basename: VITAMINB12, FOLATE, FERRITIN, TIBC, IRON, RETICCTPCT,  in the last 72 hours  Coagulation profile  Recent Labs Lab 07/16/14 2200  INR 1.21    No results found for this basename: DDIMER,  in the last 72 hours  Cardiac Enzymes No results found for this basename: CK, CKMB, TROPONINI, MYOGLOBIN,  in the last 168 hours ------------------------------------------------------------------------------------------------------------------ No components found  with this basename: POCBNP,     Raechel Marcos D.O. on 07/20/2014 at 7:07 AM  Between 7am to 7pm - Pager - [phone]  After 7pm go to [email or web address] - password [email or web address]  And look for the night  coverage person covering for me after hours  [facility] Office  [phone]

## 2014-07-20 NOTE — Progress Notes (Signed)
CARDIAC REHAB PHASE I   PRE:  Rate/Rhythm: 81 SR  BP:  Supine:   Sitting: 137/37  Standing:    SaO2: 97%RA  MODE:  Ambulation: 700 ft   POST:  Rate/Rhythm: 89 SR  BP:  Supine:   Sitting: 158/48  Standing:    SaO2: 99%RA 1415-1440 Pt walked 700 ft on RA with rolling walker with steady gait. Tolerated well. To recliner after walk. No complaints.   Graylon Good, RN BSN  07/20/2014 2:36 PM

## 2014-07-20 NOTE — Progress Notes (Signed)
ANTIBIOTIC CONSULT NOTE - FOLLOW UP  Pharmacy Consult for gentamicin Indication: endocarditis  No Known Allergies  Patient Measurements: Height: 5\' 11"  (180.3 cm) Weight: 197 lb 8.5 oz (89.6 kg) IBW/kg (Calculated) : 75.3  Vital Signs: Temp: 97.8 F (36.6 C) (08/17 1653) Temp src: Oral (08/17 1653) BP: 135/40 mmHg (08/17 1653) Pulse Rate: 75 (08/17 1653) Intake/Output from previous day: 08/16 0701 - 08/17 0700 In: 1160 [P.O.:660; I.V.:350; IV Piggyback:150] Out: 1620 [Urine:1620] Intake/Output from this shift: Total I/O In: 1110 [P.O.:840; I.V.:20; IV Piggyback:250] Out: 1175 [Urine:1175]  Labs:  Recent Labs  07/18/14 0500 07/19/14 0328 07/20/14 0300  WBC 7.8 7.8 6.4  HGB 10.0* 10.7* 10.4*  PLT 54* 57* 59*  CREATININE 1.45* 1.53* 1.57*   Estimated Creatinine Clearance: 40.6 ml/min (by C-G formula based on Cr of 1.57).  Recent Labs  07/19/14 1240  07/20/14 1330 07/20/14 1525  GENTTROUGH 0.6  --   --   --   GNFAOZHY  --   < > 0.7* 3.2*  < > = values in this interval not displayed.   Microbiology: Recent Results (from the past 720 hour(s))  CULTURE, BLOOD (ROUTINE X 2)     Status: None   Collection Time    07/24/2014  3:30 PM      Result Value Ref Range Status   Specimen Description BLOOD LEFT ARM   Final   Special Requests BOTTLES DRAWN AEROBIC AND ANAEROBIC [facility]   Final   Culture  Setup Time     Final   Value: 07/23/2014 19:23     Performed at [facility]   Culture     Final   Value: ENTEROCOCCUS SPECIES     Note: SUSCEPTIBILITIES PERFORMED ON PREVIOUS CULTURE WITHIN THE LAST 5 DAYS.     Note: Gram Stain Report Called to,Read Back By and Verified With: JAMES ARTIS 07/07/14 AT 0730 RIDK     Performed at [facility]   Report Status 07/09/2014 FINAL   Final  URINE CULTURE     Status: None   Collection Time    07/09/2014  4:18 PM      Result Value Ref Range Status   Specimen Description URINE, CLEAN CATCH   Final   Special Requests NONE    Final   Culture  Setup Time     Final   Value: 07/07/2014 02:46     Performed at [place]     Final   Value: 35,000 COLONIES/ML     Performed at [facility]   Culture     Final   Value: ENTEROBACTER CLOACAE     Performed at [facility]   Report Status 07/09/2014 FINAL   Final   Organism ID, Bacteria ENTEROBACTER CLOACAE   Final  CULTURE, BLOOD (ROUTINE X 2)     Status: None   Collection Time    07/14/2014  4:30 PM      Result Value Ref Range Status   Specimen Description BLOOD R ARM   Final   Special Requests BOTTLES DRAWN AEROBIC AND ANAEROBIC [place]   Final   Culture  Setup Time     Final   Value: 07/24/2014 19:22     Performed at [facility]   Culture     Final   Value: ENTEROCOCCUS SPECIES     Note: Gram Stain Report Called to,Read Back By and Verified With: JAMES ARTIS 07/07/14 AT [place]  Performed at [facility]   Report Status 07/09/2014 FINAL   Final   Organism ID, Bacteria ENTEROCOCCUS SPECIES   Final  MRSA PCR SCREENING     Status: None   Collection Time    07/29/2014  7:52 PM      Result Value Ref Range Status   MRSA by PCR NEGATIVE  NEGATIVE Final   Comment:            The GeneXpert MRSA Assay (FDA     approved for NASAL specimens     only), is one component of a     comprehensive MRSA colonization     surveillance program. It is not     intended to diagnose MRSA     infection nor to guide or     monitor treatment for     MRSA infections.  CULTURE, BLOOD (ROUTINE X 2)     Status: None   Collection Time    07/08/14 12:00 PM      Result Value Ref Range Status   Specimen Description BLOOD RIGHT HAND   Final   Special Requests BOTTLES DRAWN AEROBIC ONLY [facility]   Final   Culture  Setup Time     Final   Value: 07/08/2014 17:14     Performed at [facility]   Culture     Final   Value: NO GROWTH 5 DAYS     Performed at [facility]   Report Status 07/09/2014 FINAL   Final   CULTURE, BLOOD (ROUTINE X 2)     Status: None   Collection Time    07/08/14 12:10 PM      Result Value Ref Range Status   Specimen Description BLOOD LEFT HAND   Final   Special Requests BOTTLES DRAWN AEROBIC ONLY [place]   Final   Culture  Setup Time     Final   Value: 07/08/2014 17:05     Performed at [facility]   Culture     Final   Value: NO GROWTH 5 DAYS     Performed at [facility]   Report Status 07/29/2014 FINAL   Final  FECAL OCCULT BLOOD, IMMUNOCHEMICAL     Status: None   Collection Time    07/13/14  5:24 PM      Result Value Ref Range Status   Fecal Occult Bld Negative  Negative Final  MRSA PCR SCREENING     Status: None   Collection Time    07/18/2014  6:17 PM      Result Value Ref Range Status   MRSA by PCR NEGATIVE  NEGATIVE Final   Comment:            The GeneXpert MRSA Assay (FDA     approved for NASAL specimens     only), is one component of a     comprehensive MRSA colonization     surveillance program. It is not     intended to diagnose MRSA     infection nor to guide or     monitor treatment for     MRSA infections.    Anti-infectives   Start     Dose/Rate Route Frequency Ordered Stop   07/16/14 1300  gentamicin (GARAMYCIN) IVPB 100 mg     100 mg 200 mL/hr over 30 Minutes Intravenous Every 24 hours 07/15/14 1917     07/06/2014 1045  gentamicin (GARAMYCIN) 80 mg in sodium chloride irrigation 0.9 % 500 mL irrigation  Status:  Discontinued     80 mg Irrigation On call 07/20/2014 1031 07/25/2014 1805   07/12/14 1300  gentamicin (GARAMYCIN) IVPB 80 mg  Status:  Discontinued     80 mg 100 mL/hr over 30 Minutes Intravenous Every 24 hours 07/12/14 1200 07/15/14 1917   07/11/14 1200  ampicillin (OMNIPEN) 2 g in sodium chloride 0.9 % 50 mL IVPB     2 g 150 mL/hr over 20 Minutes Intravenous 4 times per day 07/11/14 0945     07/07/14 0030  piperacillin-tazobactam (ZOSYN) IVPB 3.375 g  Status:  Discontinued     3.375 g 12.5 mL/hr over 240 Minutes  Intravenous Every 8 hours 07/23/2014 2042 07/08/14 1602   07/22/2014 2300  levofloxacin (LEVAQUIN) IVPB 750 mg  Status:  Discontinued     750 mg 100 mL/hr over 90 Minutes Intravenous Every 48 hours 08/02/2014 2232 07/08/14 1022   07/20/2014 1615  piperacillin-tazobactam (ZOSYN) IVPB 3.375 g     3.375 g 100 mL/hr over 30 Minutes Intravenous  Once 07/30/2014 1604 07/21/2014 1755   07/27/2014 1615  vancomycin (VANCOCIN) IVPB 1000 mg/200 mL premix     1,000 mg 200 mL/hr over 60 Minutes Intravenous  Once 07/04/2014 1604 07/20/2014 1930   07/22/2014 0030  vancomycin (VANCOCIN) IVPB 750 mg/150 ml premix  Status:  Discontinued     750 mg 150 mL/hr over 60 Minutes Intravenous Every 12 hours 07/21/2014 2042 07/11/14 0945      Assessment:  79 yo male admitted on 8/3 for shortness of breath. 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 Patient is now s/p pacemaker removal and plan for valve replacement sometime this coming week pending renal function and platelet count. Pharmacy is consulted to dose and adjust gentamicin.  Patient is currently being treated with 100 mg IV q24hr. A peak was drawn this afternoon and it is therapeutic at 3.2.   Goal of Therapy:  Gentamicin trough level <1 mcg/ml Gentamicin peak level 3-4 mcg/ml  Plan:  Continue gentamicin 100 mg IV q24hr Watch renal function and cardiology plans for valve replacement  [facility], Pharm.D., BCPS Clinical Pharmacist Pager: [phone] 07/20/2014 5:58 PM

## 2014-07-20 NOTE — Progress Notes (Signed)
    [place] for Infectious Disease  Date of Admission:  07/16/2014  Antibiotics: Ampicillin gentamicin  Subjective: No complaints  Objective: Temp:  [97.4 F (36.3 C)-98.7 F (37.1 C)] 97.4 F (36.3 C) (08/17 0733) Pulse Rate:  [61-84] 84 (08/17 0733) Resp:  [14-27] 14 (08/17 0733) BP: (107-158)/(29-71) 144/34 mmHg (08/17 0933) SpO2:  [96 %-100 %] 97 % (08/17 0733) Weight:  [197 lb 8.5 oz (89.6 kg)] 197 lb 8.5 oz (89.6 kg) (08/17 0410)  General: awake, in chair, nad Skin: no rashes Lungs: CTA B Cor: RRR with SEM LLSB, RUSB Abdomen: soft, nt Ext: no edema  Lab Results Lab Results  Component Value Date   WBC 6.4 07/20/2014   HGB 10.4* 07/20/2014   HCT 33.4* 07/20/2014   MCV 105.4* 07/20/2014   PLT 59* 07/20/2014    Lab Results  Component Value Date   CREATININE 1.57* 07/20/2014   BUN 27* 07/20/2014   NA 135* 07/20/2014   K 4.0 07/20/2014   CL 97 07/20/2014   CO2 29 07/20/2014    Lab Results  Component Value Date   ALT 34 07/20/2014   AST 43* 07/20/2014   ALKPHOS 64 07/20/2014   BILITOT 0.5 07/20/2014      Microbiology: Recent Results (from the past 240 hour(s))  FECAL OCCULT BLOOD, IMMUNOCHEMICAL     Status: None   Collection Time    07/13/14  5:24 PM      Result Value Ref Range Status   Fecal Occult Bld Negative  Negative Final  MRSA PCR SCREENING     Status: None   Collection Time    08/03/2014  6:17 PM      Result Value Ref Range Status   MRSA by PCR NEGATIVE  NEGATIVE Final   Comment:            The GeneXpert MRSA Assay (FDA     approved for NASAL specimens     only), is one component of a     comprehensive MRSA colonization     surveillance program. It is not     intended to diagnose MRSA     infection nor to guide or     monitor treatment for     MRSA infections.    Studies/Results: No results found.  Assessment/Plan: 1) Enterococcal SBE - tolerating antibiotics well.  Creat is stable.  Gent peak level elevated and to be repeated.  Continue  with current antibiotics.    Scharlene Gloss, [place] [facility] [place] [email or web address] [phone] pager   [phone] cell 07/20/2014, 10:27 AM

## 2014-07-20 NOTE — Progress Notes (Signed)
SUBJECTIVE:  No complaints  Scheduled Meds: . amLODipine  7.5 mg Oral Daily  . ampicillin (OMNIPEN) IV  2 g Intravenous 4 times per day  . atorvastatin  10 mg Oral QPM  . chlorhexidine  15 mL Mouth/Throat BID  . feeding supplement (ENSURE COMPLETE)  237 mL Oral TID WC  . fluticasone  1 spray Each Nare Daily  . furosemide  80 mg Oral Daily  . gentamicin  100 mg Intravenous Q24H  . insulin aspart  0-9 Units Subcutaneous TID WC  . pantoprazole  40 mg Oral Daily  . predniSONE  30 mg Oral Q breakfast  . pyridOXINE  100 mg Oral QPM  . romiPLOStim  3 mcg/kg Subcutaneous Once  . sodium chloride  10-40 mL Intracatheter Q12H  . tamsulosin  0.4 mg Oral QPC supper  . vitamin E  400 Units Oral Daily    OBJECTIVE:    Vitals:   Filed Vitals:   07/20/14 0010 07/20/14 0410 07/20/14 0414 07/20/14 0733  BP: 147/50  152/44 148/71  Pulse: 76  62 84  Temp:  98 F (36.7 C)  97.4 F (36.3 C)  TempSrc:  Oral  Oral  Resp: 19  20 14   Height:      Weight:  197 lb 8.5 oz (89.6 kg)    SpO2: 100%  98% 97%   I&O's:    Intake/Output Summary (Last 24 hours) at 07/20/14 0919 Last data filed at 07/20/14 0600  Gross per 24 hour  Intake   1040 ml  Output   1420 ml  Net   -380 ml   TELEMETRY: Reviewed telemetry pt in NSR:  PHYSICAL EXAM General: Well developed, well nourished, in no acute distress Head: Eyes PERRLA, No xanthomas.   Normal cephalic and atramatic  Lungs:   Crackles at the bases. Heart:   HRRR S1 S2 Pulses are 2+ & equal. 2/6 SM at RUSB to LLSB Abdomen: Bowel sounds are positive, abdomen soft and non-tender without masses  Extremities:   2+ pitting edema up to the mid calves Neuro: Alert and oriented X 3. Psych:  Good affect, responds appropriately  LABS: Basic Metabolic Panel:  Recent Labs  07/19/14 0328 07/20/14 0300  NA 137 135*  K 4.1 4.0  CL 100 97  CO2 28 29  GLUCOSE 111* 95  BUN 25* 27*  CREATININE 1.53* 1.57*  CALCIUM 8.4 8.3*   Liver Function  Tests:  Recent Labs  07/19/14 0328 07/20/14 0300  AST 44* 43*  ALT 37 34  ALKPHOS 69 64  BILITOT 0.6 0.5  PROT 6.9 7.0  ALBUMIN 2.3* 2.2*   CBC:  Recent Labs  07/19/14 0328 07/20/14 0300  WBC 7.8 6.4  HGB 10.7* 10.4*  HCT 34.0* 33.4*  MCV 105.6* 105.4*  PLT 57* 59*   Coag Panel:   Lab Results  Component Value Date   INR 1.21 07/16/2014   INR 1.48 08/03/2014      Assessment/Plan:   1 Subacute bacterial endocarditis-enterococcus on initial blood cultures with most recent cultures negative. Patient afebrile. Continue ampicillin and gentamicin. Infectious disease following. Transesophageal echocardiogram showed aortic valve vegetation with moderate AI and severe MR. Patient will need AVR and either MVR or mitral valve repair. His pacemaker has been removed. Cardiac catheterization showed nonobstructive distal LM, widely patent LAD and stent in mid vessel, severely disease small to moderate sized diagonal, heavily calcified ostial circ with 70% stenosis, 50-70% stenosis of a small OM2, widely patent RCA with moderate irregularities.  Plan for AVR/MVR vs repair later this week Per Dr Ramonita Lab or Ludwig Clarks).  2 Paroxysmal atrial fibrillation with post-termination pauses -anticoagulation is on hold given thrombocytopenia. Pacemaker has been removed. Telemetry has shown some brief episodes of paroxysmal atrial fibrillation, the last the last night, cardioverted to SR at 3 am. Will require Pacemaker replacement once he recovers from valve surgery. No anticoagulation at present due to thrombocytopenia.   3 Stage III CKD-follow renal function closely given use of gentamicin. 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 Hematology following and managing. Plt count up to 56K on steroids  5 Hypertension-given aortic insufficiency and mitral regurgitation need to have blood pressure well controlled. We will avoid beta blockade given history of bradycardia. Avoid ACE inhibitors  given renal insufficiency. Increase Amlodipine to 10 mg po daily.  6 Chronic diastolic heart failure-patient is volume overloaded on examination. His heart failure is most likely from his mitral regurgitation and aortic insufficiency. Change PO Lasix to 60 mg BID, follow Crea closely.   [name], MD  07/20/2014  9:19 AM

## 2014-07-21 ENCOUNTER — Inpatient Hospital Stay (HOSPITAL_COMMUNITY): Payer: Medicare Other

## 2014-07-21 DIAGNOSIS — D509 Iron deficiency anemia, unspecified: Secondary | ICD-10-CM

## 2014-07-21 LAB — SURGICAL PCR SCREEN
MRSA, PCR: NEGATIVE
Staphylococcus aureus: NEGATIVE

## 2014-07-21 LAB — PROTIME-INR
INR: 1.2 (ref 0.00–1.49)
PROTHROMBIN TIME: 15.2 s (ref 11.6–15.2)

## 2014-07-21 LAB — COMPREHENSIVE METABOLIC PANEL
ALT: 37 U/L (ref 0–53)
AST: 50 U/L — AB (ref 0–37)
Albumin: 2.3 g/dL — ABNORMAL LOW (ref 3.5–5.2)
Alkaline Phosphatase: 66 U/L (ref 39–117)
Anion gap: 10 (ref 5–15)
BUN: 28 mg/dL — ABNORMAL HIGH (ref 6–23)
CALCIUM: 8.7 mg/dL (ref 8.4–10.5)
CO2: 32 meq/L (ref 19–32)
CREATININE: 1.55 mg/dL — AB (ref 0.50–1.35)
Chloride: 96 mEq/L (ref 96–112)
GFR calc non Af Amer: 41 mL/min — ABNORMAL LOW (ref >90)
GFR, EST AFRICAN AMERICAN: 47 mL/min — AB (ref >90)
GLUCOSE: 96 mg/dL (ref 70–99)
Potassium: 4 mEq/L (ref 3.7–5.3)
Sodium: 138 mEq/L (ref 137–147)
TOTAL PROTEIN: 6.8 g/dL (ref 6.0–8.3)
Total Bilirubin: 0.6 mg/dL (ref 0.3–1.2)

## 2014-07-21 LAB — CBC
HCT: 33.1 % — ABNORMAL LOW (ref 39.0–52.0)
Hemoglobin: 10.6 g/dL — ABNORMAL LOW (ref 13.0–17.0)
MCH: 34.1 pg — AB (ref 26.0–34.0)
MCHC: 32 g/dL (ref 30.0–36.0)
MCV: 106.4 fL — AB (ref 78.0–100.0)
PLATELETS: 66 10*3/uL — AB (ref 150–400)
RBC: 3.11 MIL/uL — ABNORMAL LOW (ref 4.22–5.81)
RDW: 23.2 % — ABNORMAL HIGH (ref 11.5–15.5)
WBC: 5.2 10*3/uL (ref 4.0–10.5)

## 2014-07-21 LAB — URINALYSIS, ROUTINE W REFLEX MICROSCOPIC
Bilirubin Urine: NEGATIVE
Glucose, UA: NEGATIVE mg/dL
HGB URINE DIPSTICK: NEGATIVE
KETONES UR: NEGATIVE mg/dL
Leukocytes, UA: NEGATIVE
Nitrite: NEGATIVE
PROTEIN: NEGATIVE mg/dL
Specific Gravity, Urine: 1.016 (ref 1.005–1.030)
Urobilinogen, UA: 0.2 mg/dL (ref 0.0–1.0)
pH: 6.5 (ref 5.0–8.0)

## 2014-07-21 LAB — GLUCOSE, CAPILLARY
GLUCOSE-CAPILLARY: 203 mg/dL — AB (ref 70–99)
Glucose-Capillary: 77 mg/dL (ref 70–99)
Glucose-Capillary: 192 mg/dL — ABNORMAL HIGH (ref 70–99)
Glucose-Capillary: 183 mg/dL — ABNORMAL HIGH (ref 70–99)

## 2014-07-21 LAB — HEMOGLOBIN A1C
Hgb A1c MFr Bld: 5.4 % (ref <5.7)
Mean Plasma Glucose: 108 mg/dL (ref <117)

## 2014-07-21 LAB — LACTATE DEHYDROGENASE: LDH: 514 U/L — AB (ref 94–250)

## 2014-07-21 LAB — PREALBUMIN: PREALBUMIN: 19.1 mg/dL (ref 17.0–34.0)

## 2014-07-21 LAB — PRO B NATRIURETIC PEPTIDE: Pro B Natriuretic peptide (BNP): 9533 pg/mL — ABNORMAL HIGH (ref 0–450)

## 2014-07-21 LAB — APTT: aPTT: 33 seconds (ref 24–37)

## 2014-07-21 MED ORDER — FUROSEMIDE 40 MG PO TABS
60.0000 mg | ORAL_TABLET | Freq: Every day | ORAL | Status: DC
  Administered 2014-07-22: 60 mg via ORAL
  Filled 2014-07-21: qty 1

## 2014-07-21 NOTE — Progress Notes (Signed)
CARDIAC REHAB PHASE I   PRE:  Rate/Rhythm: 88 SR PVCs  BP:  Supine:   Sitting: 123/31  Standing:    SaO2:   MODE:  Ambulation: 1050 ft   POST:  Rate/Rhythm: 88 SR PVCs  BP:  Supine:   Sitting: 139/41  Standing:    SaO2:  0946-1020 Pt walked 1050 ft on RA with rolling walker and minimal asst. Tolerated well. Likes to walk. Helped pt brush teeth and then to recliner with call bell. Encouraged pt to walk with staff later. Has walker in room.   Graylon Good, RN BSN  07/21/2014 10:17 AM

## 2014-07-21 NOTE — Progress Notes (Signed)
SUBJECTIVE:  The patient was urinating all night and feels tired. Overall he feels significantly better, improved SOB.   Scheduled Meds: . amLODipine  10 mg Oral Daily  . ampicillin (OMNIPEN) IV  2 g Intravenous 4 times per day  . atorvastatin  10 mg Oral QPM  . chlorhexidine  15 mL Mouth/Throat BID  . feeding supplement (ENSURE COMPLETE)  237 mL Oral TID WC  . fluticasone  1 spray Each Nare Daily  . furosemide  60 mg Oral BID  . gentamicin  100 mg Intravenous Q24H  . insulin aspart  0-9 Units Subcutaneous TID WC  . pantoprazole  40 mg Oral Daily  . predniSONE  20 mg Oral Q breakfast  . pyridOXINE  100 mg Oral QPM  . sodium chloride  10-40 mL Intracatheter Q12H  . tamsulosin  0.4 mg Oral QPC supper  . vitamin E  400 Units Oral Daily   OBJECTIVE:    Vitals:   Filed Vitals:   07/21/14 0405 07/21/14 0757 07/21/14 0800 07/21/14 0900  BP: 159/42  155/52 126/37  Pulse: 73 78 76 85  Temp: 98.1 F (36.7 C) 98.4 F (36.9 C)    TempSrc: Oral Oral    Resp: 20  15 23   Height:      Weight: 193 lb 2 oz (87.6 kg)     SpO2: 95%  98% 95%   I&O's:    Intake/Output Summary (Last 24 hours) at 07/21/14 1205 Last data filed at 07/21/14 0901  Gross per 24 hour  Intake   1110 ml  Output   4800 ml  Net  -3690 ml   TELEMETRY: Reviewed telemetry pt in NSR:  PHYSICAL EXAM General: Well developed, well nourished, in no acute distress Head: Eyes PERRLA, No xanthomas.   Normal cephalic and atramatic  Lungs:   Crackles at the bases. Heart:   HRRR S1 S2 Pulses are 2+ & equal. 2/6 SM at RUSB to LLSB Abdomen: Bowel sounds are positive, abdomen soft and non-tender without masses  Extremities:   1+ pitting edema up to the mid calves Neuro: Alert and oriented X 3. Psych:  Good affect, responds appropriately  LABS: Basic Metabolic Panel:  Recent Labs  07/20/14 0300 07/21/14 0430  NA 135* 138  K 4.0 4.0  CL 97 96  CO2 29 32  GLUCOSE 95 96  BUN 27* 28*  CREATININE 1.57* 1.55*    CALCIUM 8.3* 8.7   Liver Function Tests:  Recent Labs  07/20/14 0300 07/21/14 0430  AST 43* 50*  ALT 34 37  ALKPHOS 64 66  BILITOT 0.5 0.6  PROT 7.0 6.8  ALBUMIN 2.2* 2.3*   CBC:  Recent Labs  07/20/14 0300 07/21/14 0430  WBC 6.4 5.2  HGB 10.4* 10.6*  HCT 33.4* 33.1*  MCV 105.4* 106.4*  PLT 59* 66*   Coag Panel:   Lab Results  Component Value Date   INR 1.20 07/21/2014   INR 1.21 07/16/2014   INR 1.48 07/16/2014   Telemetry: SR    Assessment/Plan:   1. Subacute bacterial endocarditis-enterococcus on initial blood cultures with most recent cultures negative. Patient afebrile. Continue ampicillin and gentamicin. Infectious disease following. Transesophageal echocardiogram showed aortic valve vegetation with moderate AI and severe MR. Patient will need AVR and either MVR or mitral valve repair. His pacemaker has been removed. Cardiac catheterization showed nonobstructive distal LM, widely patent LAD and stent in mid vessel, severely disease small to moderate sized diagonal, heavily calcified ostial circ with 70%  stenosis, 50-70% stenosis of a small OM2, widely patent RCA with moderate irregularities. Plan for AVR/MVR vs repair later this week Per Dr Ramonita Lab or Ludwig Clarks).  2. Paroxysmal atrial fibrillation with post-termination pauses -anticoagulation is on hold given thrombocytopenia. Pacemaker has been removed. Telemetry has shown some brief episodes of paroxysmal atrial fibrillation, the last the last night, cardioverted to SR at 3 am. Will require Pacemaker replacement once he recovers from valve surgery. No anticoagulation at present due to thrombocytopenia.   3. Stage III CKD-follow renal function closely given use of gentamicin. Creatinine stable post cath   4. 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 Hematology following and managing. Plt count up to 56K on steroids  5. Hypertension-given aortic insufficiency and mitral regurgitation need to have blood pressure well controlled.  We will avoid beta blockade given history of bradycardia. Avoid ACE inhibitors given renal insufficiency. Increase Amlodipine to 10 mg po daily.  6. Chronic diastolic heart failure - significant improvement of volume status, -3.9 Liters in the last 24 hours, stable Crea, we will hold lasix tonight, patient is still volume overloaded on examination. His heart failure is most likely from his mitral regurgitation and aortic insufficiency. Follow Crea closely.   [name], MD  07/21/2014  12:05 PM

## 2014-07-21 NOTE — Progress Notes (Signed)
[facility]                                                                              Patient Demographics  Justin [name], is a 80 y.o. male, DOB - 1933-11-29, XKG:[id number]  Admit date - 08/02/2014   Admitting Physician Rigoberto Noel, MD  Outpatient Primary MD for the patient is Penni Homans, MD  LOS - 15   Chief Complaint  Patient presents with  . Shortness of Breath      HPI on 07/10/2014 80 y.o. male with history of coronary artery disease status post stents, hypertension, dyslipidemia, atrial fibrillation no longer on anticoagulation secondary to recent GI bleed, pacemaker, ITP currently being treated with IVIG by Dr. Marin Olp (most recently 8/3) and prednisone 80mg  daily, presented to the emergency department with complaints of fever and shortness of breath that started 8/2. In ED, patient found to have leukocytosis, hypotension, and lactic acidosis. Given 1L IVF bolus and started on levophed. Transferred to MICU for suspected sepsis.   Interim history Patient was hypotensive and required pressors. She was found to have septic shock with enterococcus bacteremia, infectious disease was consulted. TEE was recommended which showed a large aortic valve vegetation. 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 Patient was noted to have an episode of TIA, and neurology was consulted. Patient's pacemaker was removed on 07/29/2014. Cardiothoracic surgery was also consulted for further evaluation. Patient had PICC placed on 07/16/14.  He will have a heart cath 07/05/2014.  Patient will likely have AV and MV repair, CABG on 07/11/2014 with Dr. Roxy Manns.    Assessment & Plan   Enterococcus Bacteremia with Aortic Valve Vegetation:  -Received IV vancomycin for 4 days. Repeated Blood Culture 8-5 no growth to date. WBC trending down. -CT abdomen no mas.  -S/P TEE which showed Large Aortic Valve Vegetation.    -Has PICC line, double lumen. -Vancomycin was changed to ampicillin 8-8. Gentamycin added on  8-9.  -HIV non reactive, viral hepatitis negative. .  -Cardiothoracic surgery also consulted, possible surgery on 07/24/2014 -Heart cath on 07/28/2014 showed: nonobstructive distal LM, widely patent LAD and stent in mid vessel, severely disease small to moderate sized diagonal, heavily calcified ostial circ with 70% stenosis, 50-70% stenosis of a small OM2, widely patent RCA with moderate irregularities.   Mental status/Transient Hallucination -Occurred on 07/09/2014, resolved -May have been secondary to medications versus delirium -CT of the head was negative  Transient hand weakness, numbness, likely TIA -Carotid Duplex: Bilateral: 1-39% ICA stenosis. Vertebral artery flow is antegrade. Right subclavian artery demonstrates significant stenosis compared to left subclavian artery. Medical management for subclavian artery stenosis.  -Hemoglobin A1c 5.6 -Continue with Lipitor. Follow LFT -TEE showed Aortic valve vegetation, treatment and plan as above -Neurology has signed off  Possible pneumonia vs TRALI from IVIG  -Bilateral effusions - stable   Diarrhea -Resolved.   Elevated troponin -in setting sepsis.  -ECHO normal Wall motion.  -Cardiology following, patient is high-risk for anticoagulation due to to GI bleed  Enterobacter Urine:  -30,000 colonies.   Chronic diastolic heart failure - on lasix at home  -Continue lasix (  80 mg ) on 8-5.  -Possibly secondary to aortic insufficiency and mitral regurg -Dyspnea resolved -Cardiology following, continue lasix per cardiology recommendation -Heart cath 07/09/2014 -Avoid ACEI due to AKI. -has good urine output  Severe Sepsis/Septic Shock -Secondary to Enterococcus bacteremia.   -Septic shock resolved, patient did require use of pressors, Levophed -Continue antibiotics as stated above -Blood pressure currently stable  Acute kidney  injury -in setting of infection. Monitor on Lasix. Good urine output.  -Continue to monitor creatinine  Thrombocytopenia,  -History of ITP, first IVIG treatment 8/3, on chronic prednisone.  -Platelets stable, 66 -Dr. Marin Olp following and appreciated -Received one dose of Nplate and received epogen on 8/8 -Given a unit of platelets 07/12/2014. Will continue to monitor. -Patient may need additional units before surgery.  Suspected Adrenal insufficiency ( chronic steroids )  -Continue with prednisone.   History of paroxysmal atrial fibrillation  -Currently back in sinus.  -Pacemaker was removed, however per cardiology notes, patient will need placement of pacemaker post surgery.  -No anticoagulation at this time due to patient's thrombocytopenia.   Hypertension -Fairly controlled, continue amlodipine and lasix  Nasal congestion -Resolved, continue Flonase  Code Status: Full  Family Communication: None at bedside  Disposition Plan: Admitted, pending AV replacement.  Time Spent in minutes   25 minutes  Procedures 6/24 TTE >>> LVEF 55%, Grade 2 DD  8/3 CT chest >>> bilateral effusions, bilateral GGO, several nodular opacities  8/4 ECHO >>> Mild to mod MR, EF 50-55%, G2 Dia Dys Fcn  TEE: normal LV function; severe MR; large aortic valve vegetation with moderate AI; no pacemaker vegetation identified 8/8 Carotid doppler: Bilateral 1% to 39% ICA stenosis. There is a 50% to 99% right subclavian stenosis more proximal to the innominate artery based on turbulence . The left subclavian flow appears normal. Bilateral vertebral artery flow is antegrade. 8/11 Pacemaker extraction 8/13 PICC line placed 8/14 Heart cath>>nonobstructive distal LM, widely patent LAD and stent in mid vessel, severely disease small to moderate sized diagonal, heavily calcified ostial circ with 70% stenosis, 50-70% stenosis of a small OM2, widely patent RCA with moderate irregularities.  8/16 Doppler (preop):  Doppler waveforms remained normal with both radial and ulnar compressions on the right. Left Doppler waveform reversed with radial compression and remained normal with ulnar compression.   Consults   Cardiology Cardiothoracic surgery Neurology Infectious disease Oncology Dental  DVT Prophylaxis  SCDs  Lab Results  Component Value Date   PLT 66* 07/21/2014    Medications  Scheduled Meds: . amLODipine  10 mg Oral Daily  . ampicillin (OMNIPEN) IV  2 g Intravenous 4 times per day  . atorvastatin  10 mg Oral QPM  . chlorhexidine  15 mL Mouth/Throat BID  . feeding supplement (ENSURE COMPLETE)  237 mL Oral TID WC  . fluticasone  1 spray Each Nare Daily  . furosemide  60 mg Oral BID  . gentamicin  100 mg Intravenous Q24H  . insulin aspart  0-9 Units Subcutaneous TID WC  . pantoprazole  40 mg Oral Daily  . predniSONE  20 mg Oral Q breakfast  . pyridOXINE  100 mg Oral QPM  . sodium chloride  10-40 mL Intracatheter Q12H  . tamsulosin  0.4 mg Oral QPC supper  . vitamin E  400 Units Oral Daily   Continuous Infusions:  PRN Meds:.acetaminophen, ondansetron (ZOFRAN) IV, polyvinyl alcohol, sodium chloride  Antibiotics    Anti-infectives   Start     Dose/Rate Route Frequency Ordered Stop  07/16/14 1300  gentamicin (GARAMYCIN) IVPB 100 mg     100 mg 200 mL/hr over 30 Minutes Intravenous Every 24 hours 07/15/14 1917     08/03/2014 1045  gentamicin (GARAMYCIN) 80 mg in sodium chloride irrigation 0.9 % 500 mL irrigation  Status:  Discontinued     80 mg Irrigation On call 07/11/2014 1031 07/25/2014 1805   07/12/14 1300  gentamicin (GARAMYCIN) IVPB 80 mg  Status:  Discontinued     80 mg 100 mL/hr over 30 Minutes Intravenous Every 24 hours 07/12/14 1200 07/15/14 1917   07/11/14 1200  ampicillin (OMNIPEN) 2 g in sodium chloride 0.9 % 50 mL IVPB     2 g 150 mL/hr over 20 Minutes Intravenous 4 times per day 07/11/14 0945     07/07/14 0030  piperacillin-tazobactam (ZOSYN) IVPB 3.375 g  Status:   Discontinued     3.375 g 12.5 mL/hr over 240 Minutes Intravenous Every 8 hours 08/01/2014 2042 07/08/14 1602   08/01/2014 2300  levofloxacin (LEVAQUIN) IVPB 750 mg  Status:  Discontinued     750 mg 100 mL/hr over 90 Minutes Intravenous Every 48 hours 07/05/2014 2232 07/08/14 1022   07/25/2014 1615  piperacillin-tazobactam (ZOSYN) IVPB 3.375 g     3.375 g 100 mL/hr over 30 Minutes Intravenous  Once 07/29/2014 1604 07/04/2014 1755   07/11/2014 1615  vancomycin (VANCOCIN) IVPB 1000 mg/200 mL premix     1,000 mg 200 mL/hr over 60 Minutes Intravenous  Once 07/19/2014 1604 07/28/2014 1930   07/25/2014 0030  vancomycin (VANCOCIN) IVPB 750 mg/150 ml premix  Status:  Discontinued     750 mg 150 mL/hr over 60 Minutes Intravenous Every 12 hours 07/27/2014 2042 07/11/14 0945        Subjective:   Justin [name] seen and examined today.  :Patient states he is feeling well this morning.  He would like to walk a few laps today.  Denies nasal congestion.  He has no complaints this morning, denies chest pain, dizziness, headache, SOB, abdominal pain.  Objective:   Filed Vitals:   07/20/14 2312 07/21/14 0000 07/21/14 0400 07/21/14 0405  BP: 156/49 156/49  159/42  Pulse: 72 74 70 73  Temp: 97.9 F (36.6 C)   98.1 F (36.7 C)  TempSrc: Oral   Oral  Resp: 20 17 18 20   Height:      Weight:    87.6 kg (193 lb 2 oz)  SpO2: 98% 97% 93% 95%    Wt Readings from Last 3 Encounters:  07/21/14 87.6 kg (193 lb 2 oz)  07/21/14 87.6 kg (193 lb 2 oz)  07/21/14 87.6 kg (193 lb 2 oz)     Intake/Output Summary (Last 24 hours) at 07/21/14 0735 Last data filed at 07/21/14 0600  Gross per 24 hour  Intake   1740 ml  Output   4900 ml  Net  -3160 ml    Exam  General: Well developed, well nourished, NAD, appears stated age  4: NCAT, mucous membranes moist.   Cardiovascular: S1 S2 auscultated, Regular, 1/6 SEM  Respiratory: Clear to ascultation bilaterally, scattered wheezing  Abdomen: Soft, nontender, nondistended, +  bowel sounds  Extremities: warm dry without cyanosis clubbing. + 2 edema in LE B/L  Neuro: AAOx3, no focal deficits  Psych: Normal affect and demeanor with intact judgement and insight  Data Review   Micro Results Recent Results (from the past 240 hour(s))  FECAL OCCULT BLOOD, IMMUNOCHEMICAL     Status: None   Collection Time  07/13/14  5:24 PM      Result Value Ref Range Status   Fecal Occult Bld Negative  Negative Final  MRSA PCR SCREENING     Status: None   Collection Time    08/02/2014  6:17 PM      Result Value Ref Range Status   MRSA by PCR NEGATIVE  NEGATIVE Final   Comment:            The GeneXpert MRSA Assay (FDA     approved for NASAL specimens     only), is one component of a     comprehensive MRSA colonization     surveillance program. It is not     intended to diagnose MRSA     infection nor to guide or     monitor treatment for     MRSA infections.    Radiology Reports Ct Abdomen Pelvis Wo Contrast  07/09/2014   CLINICAL DATA:  Enterococcus bacteremia. Evaluate for abdominal source. Evaluate for potential tumor. Enterobacter is also present in the urine.  EXAM: CT ABDOMEN AND PELVIS WITHOUT CONTRAST  TECHNIQUE: Multidetector CT imaging of the abdomen and pelvis was performed following the standard protocol without IV contrast.  COMPARISON:  No priors.  FINDINGS: Lung Bases: Atherosclerotic calcifications in the right coronary artery. Pacemaker lead terminating in the right ventricular apex. Moderate-sized hiatal hernia. Moderate to large bilateral pleural effusions which appear to layer dependently and are associated with extensive passive atelectasis in the visualized portions of the lower lobes of the lungs bilaterally.  Abdomen/Pelvis: 9 mm low attenuation lesion in segment 2 of the liver is incompletely characterized on today's noncontrast CT examination, but is unchanged, and favored to represent a tiny cyst. No other focal hepatic lesions are identified on  today's non contrast CT examination. There is a tiny calcifications in the head of the pancreas (image 31 of series 2) which appears to be immediately posterior to the distal common bile duct, potentially related to prior episodes of pancreatitis. Other small calcifications are also noted in the head and tail of the pancreas as well. The calcification in the head of the pancreas is not favored to be within the common bile duct, as the common bile duct is normal in caliber measuring 4 mm, and there is no intrahepatic biliary ductal dilatation to suggest obstruction. The unenhanced appearance of the gallbladder, spleen and bilateral adrenal glands is unremarkable. There is bilateral perinephric stranding which is nonspecific. 1.5 cm intermediate attenuation lesion extending exophytically off the lateral aspect of the upper pole of the right kidney is incompletely characterized on today's non contrast CT examination, but is statistically likely to represent a mildly proteinaceous cyst.  Normal appendix. Trace volume of ascites. No pneumoperitoneum. No pathologic distention of small bowel. 10 mm short axis inguinal lymph nodes bilaterally are nonspecific. No other definite lymphadenopathy identified in the abdomen or pelvis on today's non contrast CT examination. Mild diffuse mesenteric edema. Prostate gland is enlarged measuring 4.9 x 6.1 cm. Urinary bladder is generally unremarkable in appearance, however, the attenuation values in the urine are rather high ranging from 45-55 HU, suggesting proteinaceous or hemorrhagic contents.  Musculoskeletal: Mild diffuse body wall edema. There are no aggressive appearing lytic or blastic lesions noted in the visualized portions of the skeleton.  IMPRESSION: 1. No definite source for bacteremia identified on today's examination. 2. Relatively high attenuation of the urine is of uncertain etiology and significance, but may suggest some proteinaceous or hemorrhagic contents. 3.  Findings  suggestive of anasarca, including moderate to large bilateral pleural effusions, trace volume of ascites, mild diffuse mesenteric edema and diffuse body wall edema. 4. Prostatomegaly. 5. Additional incidental findings, as above.   Electronically Signed   By: Vinnie Langton M.D.   On: 07/09/2014 12:46   Dg Chest 2 View  07/15/2014   CLINICAL DATA:  Pacemaker extraction  EXAM: CHEST  2 VIEW  COMPARISON:  07/07/2014  FINDINGS: Cardiac pacer has been removed with generator and 2 leads absent. Mild interstitial change in peribronchial cuffing. These findings are less prominent when compared to the prior study. There is consolidation in the medial left lung base which appears most consistent with atelectasis.  IMPRESSION: 1. Mild residual interstitial change may reflect minimal remaining interstitial pulmonary edema 2. Left lower lobe atelectasis   Electronically Signed   By: Skipper Cliche M.D.   On: 07/15/2014 08:15   Dg Chest 2 View  08/01/2014   CLINICAL DATA:  Fever, chills, shortness of breath.  EXAM: CHEST  2 VIEW  COMPARISON:  06/10/2014  FINDINGS: Left pacer in place with leads in the right atrium and right ventricle. Heart is normal size. Mild peribronchial thickening. No confluent airspace opacities or effusions. No acute bony abnormality.  IMPRESSION: Bronchitic changes.   Electronically Signed   By: Rolm Baptise M.D.   On: 07/21/2014 15:07   Ct Head Wo Contrast  07/09/2014   CLINICAL DATA:  Hallucinations.  Thrombocytopenia  EXAM: CT HEAD WITHOUT CONTRAST  TECHNIQUE: Contiguous axial images were obtained from the base of the skull through the vertex without intravenous contrast.  COMPARISON:  None.  FINDINGS: Mild atrophy. Negative for hemorrhage. Negative for acute infarct or mass. Calvarium intact.  IMPRESSION: No acute abnormality.   Electronically Signed   By: Franchot Gallo M.D.   On: 07/09/2014 18:47   Ct Angio Chest W/cm &/or Wo Cm  07/11/2014   CLINICAL DATA:  Shortness of breath  and fever  EXAM: CT ANGIOGRAPHY CHEST WITH CONTRAST  TECHNIQUE: Multidetector CT imaging of the chest was performed using the standard protocol during bolus administration of intravenous contrast. Multiplanar CT image reconstructions and MIPs were obtained to evaluate the vascular anatomy.  CONTRAST:  48mL OMNIPAQUE IOHEXOL 350 MG/ML SOLN  COMPARISON:  Chest radiograph July 06, 2014  FINDINGS: There is no demonstrable pulmonary embolus. There is no thoracic aortic aneurysm or dissection.  There are free-flowing pleural effusions bilaterally. There is hazy ground-glass opacity diffusely.  On axial CT slice 55 series 6, there is a 6 x 4 mm nodular opacity in the medial segment of the right middle lobe. On axial slice 64 series 6, there is a 6 x 4 mm nodular opacity in the lateral segment of the left lower lobe. On axial slice 62 series 6, there is a 7 x 5 mm nodular opacity in the lateral segment of the right middle lobe. On axial slice 69 series 6, there is a 5 x 4 mm nodular opacity in the superior segment of the right lower lobe.  There is no appreciable thoracic adenopathy. Pericardium is not thickened. Pacemaker leads are attached to the right atrium and right ventricle. There is a focal hiatal hernia.  Visualized upper abdominal structures appear unremarkable. There are no blastic or lytic bone lesions. There is degenerative change in the thoracic spine. Thyroid appears unremarkable.  Review of the MIP images confirms the above findings.  IMPRESSION: No demonstrable pulmonary embolus.  Bilateral effusions with extensive ground-glass opacity bilaterally. Suspect alveolar edema  with findings consistent with congestive heart failure. Atypical infection or allergic type phenomenon are differential considerations, however.  Several nodular opacities, largest measuring 7 mm. Followup of these nodular opacity should be based on Fleischner Society guidelines. If the patient is at high risk for bronchogenic carcinoma,  follow-up chest CT at 3-48months is recommended. If the patient is at low risk for bronchogenic carcinoma, follow-up chest CT at 6-12 months is recommended. This recommendation follows the consensus statement: Guidelines for Management of Small Pulmonary Nodules Detected on CT Scans: A Statement from the [place] as published in Radiology 2005; 237:395-400.  Focal hiatal hernia.  No adenopathy.   Electronically Signed   By: Lowella Grip M.D.   On: 07/31/2014 18:09   Dg Chest Port 1 View  07/07/2014   CLINICAL DATA:  Evaluate airspace.  EXAM: PORTABLE CHEST - 1 VIEW  COMPARISON:  CT 07/08/2014.  Chest x-ray 08/01/2014 and 06/10/2014.  FINDINGS: Cardiac pacer with lead tips in right atrium and right ventricle. Mediastinum and hilar structures are unremarkable. Cardiomegaly with mild pulmonary vascular prominence and interstitial prominence consistent with congestive heart failure. Small pleural effusions. No pneumothorax. Reference is made to recent chest CT which demonstrated small pulmonary nodules. These are not definitely identified by chest x-ray. No acute bony abnormality.  IMPRESSION: 1. Persistent changes of congestive heart failure pulmonary edema. Small pleural effusions. 2. Reference is made to recent chest CT which demonstrated small pulmonary nodules. Follow-up is recommended as on prior CT report.   Electronically Signed   By: Marcello Moores  Register   On: 07/07/2014 07:26    CBC  Recent Labs Lab 07/31/2014 0500 07/18/14 0500 07/19/14 0328 07/20/14 0300 07/21/14 0430  WBC 8.2 7.8 7.8 6.4 5.2  HGB 10.4* 10.0* 10.7* 10.4* 10.6*  HCT 32.2* 31.8* 34.0* 33.4* 33.1*  PLT 36* 54* 57* 59* 66*  MCV 102.9* 105.3* 105.6* 105.4* 106.4*  MCH 33.2 33.1 33.2 32.8 34.1*  MCHC 32.3 31.4 31.5 31.1 32.0  RDW 23.9* 24.3* 23.8* 23.5* 23.2*    Chemistries   Recent Labs Lab 07/10/2014 0500 07/18/14 0500 07/19/14 0328 07/20/14 0300 07/21/14 0430  NA 141 139 137 135* 138  K 3.7 4.1 4.1 4.0  4.0  CL 100 101 100 97 96  CO2 27 27 28 29  32  GLUCOSE 105* 96 111* 95 96  BUN 26* 23 25* 27* 28*  CREATININE 1.47* 1.45* 1.53* 1.57* 1.55*  CALCIUM 8.2* 8.4 8.4 8.3* 8.7  AST 40* 41* 44* 43* 50*  ALT 35 34 37 34 37  ALKPHOS 59 62 69 64 66  BILITOT 0.7 0.6 0.6 0.5 0.6   ------------------------------------------------------------------------------------------------------------------ estimated creatinine clearance is 41.2 ml/min (by C-G formula based on Cr of 1.55). ------------------------------------------------------------------------------------------------------------------ No results found for this basename: HGBA1C,  in the last 72 hours ------------------------------------------------------------------------------------------------------------------ No results found for this basename: CHOL, HDL, LDLCALC, TRIG, CHOLHDL, LDLDIRECT,  in the last 72 hours ------------------------------------------------------------------------------------------------------------------ No results found for this basename: TSH, T4TOTAL, FREET3, T3FREE, THYROIDAB,  in the last 72 hours ------------------------------------------------------------------------------------------------------------------ No results found for this basename: VITAMINB12, FOLATE, FERRITIN, TIBC, IRON, RETICCTPCT,  in the last 72 hours  Coagulation profile  Recent Labs Lab 07/16/14 2200 07/21/14 0430  INR 1.21 1.20    No results found for this basename: DDIMER,  in the last 72 hours  Cardiac Enzymes No results found for this basename: CK, CKMB, TROPONINI, MYOGLOBIN,  in the last 168 hours ------------------------------------------------------------------------------------------------------------------ No components found with this basename: POCBNP,     Justin [name]  D.O. on 07/21/2014 at 7:35 AM  Between 7am to 7pm - Pager - [phone]  After 7pm go to [email or web address] - password [email or web address]  And look for the night  coverage person covering for me after hours  [facility] Office  [phone]

## 2014-07-21 NOTE — Progress Notes (Signed)
Justin [name] is doing pretty well. His heart surgery will not be until Friday now. I spoke to Dr. Ricard Dillon of thoracic surgery. He wants to do surgery on Friday. His telemetry she'll have 2 valves replaced and coronary artery bypass.  Justin [name]'s platelet count is up to 66,000 now. He keeps trending upward. Hopefully, this is a result of his infection being treated, his immunosuppressive therapy, and his Nplate.  He is eating well. He had no nausea vomiting. He is doing more in the way of physical therapy.  We will continue to get his prednisone dose down. He is on 20 mg a day right now. I'll probably get him down to 10 mg tomorrow.  He's had no fever. He's had no nausea or vomiting.  His hemoglobin is also trending upward. His creatinine is holding stable at 1.55.  Overall, his hematologic parameters continued to improve and I really think that bleeding should be minimal with surgery.  Pete e.

## 2014-07-21 NOTE — Progress Notes (Addendum)
      [address]       [place] [address]             [phone]     CARDIOTHORACIC SURGERY PROGRESS NOTE  4 Days Post-Op  S/P Procedure(s) (LRB): CORONARY ANGIOGRAM (N/A)  Subjective: Feels well. No complaints. Ambulated in all. Denies shortness of breath.  Objective: Vital signs in last 24 hours: Temp:  [97.8 F (36.6 C)-99 F (37.2 C)] 99 F (37.2 C) (08/18 1218) Pulse Rate:  [70-85] 76 (08/18 1218) Cardiac Rhythm:  [-] Heart block (08/18 0800) Resp:  [15-23] 20 (08/18 1218) BP: (109-159)/(35-52) 109/35 mmHg (08/18 1218) SpO2:  [93 %-98 %] 97 % (08/18 1218) Weight:  [87.6 kg (193 lb 2 oz)] 87.6 kg (193 lb 2 oz) (08/18 0405)  Physical Exam:  Rhythm:   sinus  Breath sounds: clear  Heart sounds:  RRR  Incisions:  Dressing dry, intact  Abdomen:  soft  Extremities:  warm   Intake/Output from previous day: 08/17 0701 - 08/18 0700 In: 1740 [P.O.:1320; I.V.:20; IV Piggyback:400] Out: 4900 [Urine:4900] Intake/Output this shift: Total I/O In: 480 [P.O.:460; I.V.:20] Out: 600 [Urine:600]  Lab Results:  Recent Labs  07/20/14 0300 07/21/14 0430  WBC 6.4 5.2  HGB 10.4* 10.6*  HCT 33.4* 33.1*  PLT 59* 66*   BMET:  Recent Labs  07/20/14 0300 07/21/14 0430  NA 135* 138  K 4.0 4.0  CL 97 96  CO2 29 32  GLUCOSE 95 96  BUN 27* 28*  CREATININE 1.57* 1.55*  CALCIUM 8.3* 8.7    CBG (last 3)   Recent Labs  07/20/14 2141 07/21/14 0757 07/21/14 1219  GLUCAP 161* 77 203*   PT/INR:   Recent Labs  07/21/14 0430  LABPROT 15.2  INR 1.20    CXR:  CHEST 2 VIEW  COMPARISON: 07/16/2014  FINDINGS:  Cardiomediastinal silhouette is stable. No acute infiltrate or  pneumothorax. No pulmonary edema. Right arm PICC line is unchanged  in position. Trace left pleural effusion with left basilar  atelectasis.  IMPRESSION:  No pneumothorax. No acute infiltrate or pulmonary edema. Trace left  pleural effusion with left basilar atelectasis    Electronically Signed  By: Lahoma Crocker M.D.  On: 07/21/2014 08:10   Assessment/Plan: S/P Procedure(s) (LRB): CORONARY ANGIOGRAM (N/A)  Clinically the patient is doing well and platelet count continues to gradually trend up. We tentatively plan to proceed with surgery on Friday, 07/06/2014.  Pasco Marchitto H 07/21/2014 11:45 am

## 2014-07-22 LAB — CBC
HCT: 32.7 % — ABNORMAL LOW (ref 39.0–52.0)
Hemoglobin: 10.5 g/dL — ABNORMAL LOW (ref 13.0–17.0)
MCH: 33.9 pg (ref 26.0–34.0)
MCHC: 32.1 g/dL (ref 30.0–36.0)
MCV: 105.5 fL — AB (ref 78.0–100.0)
PLATELETS: 64 10*3/uL — AB (ref 150–400)
RBC: 3.1 MIL/uL — AB (ref 4.22–5.81)
RDW: 23.1 % — ABNORMAL HIGH (ref 11.5–15.5)
WBC: 5.2 10*3/uL (ref 4.0–10.5)

## 2014-07-22 LAB — COMPREHENSIVE METABOLIC PANEL
ALBUMIN: 2.2 g/dL — AB (ref 3.5–5.2)
ALT: 32 U/L (ref 0–53)
AST: 45 U/L — AB (ref 0–37)
Alkaline Phosphatase: 61 U/L (ref 39–117)
Anion gap: 8 (ref 5–15)
BILIRUBIN TOTAL: 0.4 mg/dL (ref 0.3–1.2)
BUN: 34 mg/dL — ABNORMAL HIGH (ref 6–23)
CHLORIDE: 99 meq/L (ref 96–112)
CO2: 33 meq/L — AB (ref 19–32)
CREATININE: 1.84 mg/dL — AB (ref 0.50–1.35)
Calcium: 8.3 mg/dL — ABNORMAL LOW (ref 8.4–10.5)
GFR calc Af Amer: 38 mL/min — ABNORMAL LOW (ref >90)
GFR calc non Af Amer: 33 mL/min — ABNORMAL LOW (ref >90)
Glucose, Bld: 90 mg/dL (ref 70–99)
POTASSIUM: 4.2 meq/L (ref 3.7–5.3)
SODIUM: 140 meq/L (ref 137–147)
Total Protein: 6.4 g/dL (ref 6.0–8.3)

## 2014-07-22 LAB — GLUCOSE, CAPILLARY
GLUCOSE-CAPILLARY: 83 mg/dL (ref 70–99)
GLUCOSE-CAPILLARY: 145 mg/dL — AB (ref 70–99)
GLUCOSE-CAPILLARY: 153 mg/dL — AB (ref 70–99)
Glucose-Capillary: 179 mg/dL — ABNORMAL HIGH (ref 70–99)

## 2014-07-22 LAB — LACTATE DEHYDROGENASE: LDH: 467 U/L — ABNORMAL HIGH (ref 94–250)

## 2014-07-22 MED ORDER — PREDNISONE 10 MG PO TABS
10.0000 mg | ORAL_TABLET | Freq: Every day | ORAL | Status: DC
  Administered 2014-07-22 – 2014-07-23 (×2): 10 mg via ORAL
  Filled 2014-07-22 (×2): qty 1

## 2014-07-22 MED ORDER — FUROSEMIDE 40 MG PO TABS
60.0000 mg | ORAL_TABLET | Freq: Two times a day (BID) | ORAL | Status: DC
  Administered 2014-07-22 – 2014-07-23 (×2): 60 mg via ORAL
  Filled 2014-07-22 (×4): qty 1

## 2014-07-22 MED ORDER — AMLODIPINE BESYLATE 2.5 MG PO TABS
2.5000 mg | ORAL_TABLET | Freq: Every day | ORAL | Status: DC
  Administered 2014-07-23: 2.5 mg via ORAL
  Filled 2014-07-22: qty 1

## 2014-07-22 NOTE — Progress Notes (Signed)
Physical Therapy Treatment Patient Details Name: Justin [name] MRN: [id number] DOB: 1933-04-19 Today's Date: 07/22/2014    History of Present Illness Pt is a 80 y.o. male with history of CAD status post stents, hypertension, dyslipidemia, a-fib, pacemaker, ITP currently being treated with IVIG, who presents to the emergency department with complaints of fever and shortness of breath that started 8/2. In ED found to have leukocytosis, hypotension, and lactic acidosis. Transferred to MICU for suspected sepsis.     PT Comments    Pt progressing towards physical therapy goals. Pt states he is ambulating 5-6 laps around the unit 2x/day with RW and staff members. He is hesitant to ambulate without an AD, however therapist encouraged pt to challenge himself as he was not using an AD PTA. During ambulation HR remained ~90bpm and pt had no LOB. Was able to tolerate head turns to challenge balance even more. Current plan is to OR on 07/15/2014.   Follow Up Recommendations  Home health PT;Supervision for mobility/OOB     Equipment Recommendations  Rolling walker with 5" wheels    Recommendations for Other Services       Precautions / Restrictions Precautions Precautions: Fall Restrictions Weight Bearing Restrictions: No    Mobility  Bed Mobility               General bed mobility comments: Pt received sitting in the recliner  Transfers Overall transfer level: Needs assistance Equipment used: None Transfers: Sit to/from Stand Sit to Stand: Supervision         General transfer comment: VC's for general safety awareness. No physical assist required.   Ambulation/Gait Ambulation/Gait assistance: Min guard Ambulation Distance (Feet): 600 Feet Assistive device: None Gait Pattern/deviations: Step-through pattern;Decreased stride length;Narrow base of support Gait velocity: Decreased Gait velocity interpretation: Below normal speed for age/gender General Gait Details: Occasional  unsteadiness noted to the R without RW use. Pt demonstrated head turns (up/down, L/R) with minimal increase in instability.   Stairs            Wheelchair Mobility    Modified Rankin (Stroke Patients Only)       Balance Overall balance assessment: Needs assistance Sitting-balance support: Feet supported;No upper extremity supported Sitting balance-Leahy Scale: Good     Standing balance support: No upper extremity supported;During functional activity Standing balance-Leahy Scale: Fair Standing balance comment: No full losses of balance during dynamic standing activity.                     Cognition Arousal/Alertness: Awake/alert Behavior During Therapy: WFL for tasks assessed/performed Overall Cognitive Status: Within Functional Limits for tasks assessed                      Exercises General Exercises - Lower Extremity Straight Leg Raises: Standing;15 reps Mini-Sqauts: 15 reps;Standing    General Comments General comments (skin integrity, edema, etc.): Issued another HEP for pt to have here in the hospital as family members took the other home with other paperwork. Discussed technique, and frequency.       Pertinent Vitals/Pain Pain Assessment: No/denies pain    Home Living                      Prior Function            PT Goals (current goals can now be found in the care plan section) Acute Rehab PT Goals Patient Stated Goal: To return home with wife PT Goal  Formulation: With patient/family Time For Goal Achievement: 07/27/14 Potential to Achieve Goals: Good Progress towards PT goals: Progressing toward goals    Frequency  Min 3X/week    PT Plan Current plan remains appropriate    Co-evaluation             End of Session Equipment Utilized During Treatment: Gait belt Activity Tolerance: Patient tolerated treatment well Patient left: with call bell/phone within reach;in chair     Time: 3202-3343 PT Time  Calculation (min): 28 min  Charges:  $Gait Training: 8-22 mins $Therapeutic Activity: 8-22 mins                    G Codes:      Jolyn Lent Aug 09, 2014, 10:57 AM  Jolyn Lent, PT, DPT Acute Rehabilitation Services Pager: [phone]

## 2014-07-22 NOTE — Progress Notes (Signed)
      [address]       [place] [address]             [phone]     CARDIOTHORACIC SURGERY PROGRESS NOTE  5 Days Post-Op  S/P Procedure(s) (LRB): CORONARY ANGIOGRAM (N/A)  Subjective: Clinically feels well  Objective: Vital signs in last 24 hours: Temp:  [98.2 F (36.8 C)-99 F (37.2 C)] 98.2 F (36.8 C) (08/19 1630) Cardiac Rhythm:  [-] Atrial flutter (08/19 1140) Resp:  [11-21] 15 (08/19 1630) BP: (81-156)/(26-65) 156/49 mmHg (08/19 1630) SpO2:  [95 %-100 %] 100 % (08/19 1630) Weight:  [87 kg (191 lb 12.8 oz)] 87 kg (191 lb 12.8 oz) (08/19 0507)  Physical Exam:  Rhythm:   sinus  Breath sounds: clear  Heart sounds:  RRR  Incisions:  n/a  Abdomen:  soft  Extremities:  warm   Intake/Output from previous day: 08/18 0701 - 08/19 0700 In: 1500 [P.O.:1180; I.V.:20; IV Piggyback:300] Out: 1400 [Urine:1400] Intake/Output this shift: Total I/O In: 1370 [P.O.:1200; I.V.:20; IV Piggyback:150] Out: 600 [Urine:600]  Lab Results:  Recent Labs  07/21/14 0430 07/22/14 0445  WBC 5.2 5.2  HGB 10.6* 10.5*  HCT 33.1* 32.7*  PLT 66* 64*   BMET:  Recent Labs  07/21/14 0430 07/22/14 0445  NA 138 140  K 4.0 4.2  CL 96 99  CO2 32 33*  GLUCOSE 96 90  BUN 28* 34*  CREATININE 1.55* 1.84*  CALCIUM 8.7 8.3*    CBG (last 3)   Recent Labs  07/22/14 0742 07/22/14 1140 07/22/14 1632  GLUCAP 83 145* 179*   PT/INR:   Recent Labs  07/21/14 0430  LABPROT 15.2  INR 1.20    CXR:  N/A  Assessment/Plan: S/P Procedure(s) (LRB): CORONARY ANGIOGRAM (N/A)  Platelet count stable. Creatinine up slightly. Tentatively for OR Friday if renal function stable.  Jozalyn Baglio H 07/22/2014 5:44 PM

## 2014-07-22 NOTE — Progress Notes (Signed)
Justin [name] is doing pretty well. He is getting ready for surgery on Friday.  His platelet count is 64,000. This is holding steady.  It did receive a dose of Nplate on Monday. We might see the effects of this tomorrow.  He's had no bleeding. He's had no fever.  His appetite is doing pretty well.  His LDH is coming down. It is now 467. His hemoglobin is 10.5. There is no evidence of homolysis.  His kidney function is a little bit lower. He will have to have this watched closely. This might be from the dye that he received for his cardiac cath.  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  We will continue to follow him daily.  His prednisone dose is now down to 10 mg.  I still do not think that he will have a lot of bleeding issues with respect to his surgery. However, I do not see any problems with respect to him he platelet transfusions. We will just have to see how his blood count looks on Friday.  Laurey Arrow

## 2014-07-22 NOTE — Progress Notes (Signed)
[facility]  Date of Admission:  07/15/2014  Antibiotics: Ampicillin gentamicin  Subjective: No complaints  Objective: Temp:  [98.5 F (36.9 C)-99 F (37.2 C)] 98.6 F (37 C) (08/19 0745) Pulse Rate:  [76-83] 83 (08/18 1636) Resp:  [11-23] 15 (08/19 0745) BP: (100-133)/(26-65) 122/65 mmHg (08/19 0745) SpO2:  [95 %-99 %] 98 % (08/19 0745) Weight:  [191 lb 12.8 oz (87 kg)] 191 lb 12.8 oz (87 kg) (08/19 0507)  General: awake, in chair, nad Skin: no rashes Lungs: CTA B Cor: RRR with SEM LLSB, RUSB Abdomen: soft, nt Ext: no edema  Lab Results Lab Results  Component Value Date   WBC 5.2 07/22/2014   HGB 10.5* 07/22/2014   HCT 32.7* 07/22/2014   MCV 105.5* 07/22/2014   PLT 64* 07/22/2014    Lab Results  Component Value Date   CREATININE 1.84* 07/22/2014   BUN 34* 07/22/2014   NA 140 07/22/2014   K 4.2 07/22/2014   CL 99 07/22/2014   CO2 33* 07/22/2014    Lab Results  Component Value Date   ALT 32 07/22/2014   AST 45* 07/22/2014   ALKPHOS 61 07/22/2014   BILITOT 0.4 07/22/2014      Microbiology: Recent Results (from the past 240 hour(s))  FECAL OCCULT BLOOD, IMMUNOCHEMICAL     Status: None   Collection Time    07/13/14  5:24 PM      Result Value Ref Range Status   Fecal Occult Bld Negative  Negative Final  MRSA PCR SCREENING     Status: None   Collection Time    07/05/2014  6:17 PM      Result Value Ref Range Status   MRSA by PCR NEGATIVE  NEGATIVE Final   Comment:            The GeneXpert MRSA Assay (FDA     approved for NASAL specimens     only), is one component of a     comprehensive MRSA colonization     surveillance program. It is not     intended to diagnose MRSA     infection nor to guide or     monitor treatment for     MRSA infections.  SURGICAL PCR SCREEN     Status: None   Collection Time    07/21/14 11:05 AM      Result Value Ref Range Status   MRSA, PCR NEGATIVE  NEGATIVE Final   Staphylococcus aureus NEGATIVE   NEGATIVE Final   Comment:            The Xpert SA Assay (FDA     approved for NASAL specimens     in patients over 80 years of age),     is one component of     a comprehensive surveillance     program.  Test performance has     been validated by [employer] for patients greater     than or equal to 55 year old.     It is not intended     to diagnose infection nor to     guide or monitor treatment.    Studies/Results: Dg Chest 2 View  07/21/2014   CLINICAL DATA:  Pacemaker removal  EXAM: CHEST  2 VIEW  COMPARISON:  07/16/2014  FINDINGS: Cardiomediastinal silhouette is stable. No acute infiltrate or pneumothorax. No pulmonary edema. Right arm PICC line is unchanged in position. Trace left pleural effusion with  left basilar atelectasis.  IMPRESSION: No pneumothorax. No acute infiltrate or pulmonary edema. Trace left pleural effusion with left basilar atelectasis   Electronically Signed   By: Lahoma Crocker M.D.   On: 07/21/2014 08:10    Assessment/Plan: 1) Enterococcal SBE - tolerating antibiotics well.  Creat is a bit up today, will continue to monitor that.  Gent levels now noted to be good after some bad samples.  Continue with current antibiotics.    Scharlene Gloss, [facility] [place] [email or web address] [phone] pager   [phone] cell 07/22/2014, 10:54 AM

## 2014-07-22 NOTE — Progress Notes (Addendum)
[facility]                                                                              Patient Demographics  Justin [name], is a 80 y.o. male, DOB - [birth year], DQQ:[id number]  Admit date - 07/29/2014   Admitting Physician Rigoberto Noel, MD  Outpatient Primary MD for the patient is Penni Homans, MD  LOS - 16   Chief Complaint  Patient presents with  . Shortness of Breath      HPI on 07/05/2014 80 y.o. male with history of coronary artery disease status post stents, hypertension, dyslipidemia, atrial fibrillation no longer on anticoagulation secondary to recent GI bleed, pacemaker, ITP currently being treated with IVIG by Dr. Marin Olp (most recently 8/3) and prednisone 80mg  daily, presented to the emergency department with complaints of fever and shortness of breath that started 8/2. In ED, patient found to have leukocytosis, hypotension, and lactic acidosis. Given 1L IVF bolus and started on levophed. Transferred to MICU for suspected sepsis.   Interim history Patient was hypotensive and required pressors. he was found to have septic shock with enterococcus bacteremia, infectious disease was consulted. TEE was recommended which showed a large aortic valve vegetation. 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 Patient was noted to have an episode of TIA, and neurology was consulted. Patient's pacemaker was removed on 07/16/2014. Cardiothoracic surgery was also consulted for further evaluation. Patient had PICC placed on 07/16/14.  He will have a heart cath 07/28/2014.  Plan per Dr Roxy Manns is for pt to have AV and MV repair, CABG on 07/17/2014   Assessment & Plan   Enterococcus Bacteremia with Aortic Valve Vegetation:  -Received IV vancomycin for 4 days. Repeated Blood Culture 8-5 no growth to date. WBC trending down. -CT abdomen no mas.  -S/P TEE which showed Large Aortic Valve Vegetation.  -Has  PICC line, double lumen. -Vancomycin was changed to ampicillin 8-8. Gentamycin added on  8-9.  -HIV non reactive, viral hepatitis negative. .  -Heart cath on 08/02/2014 showed: nonobstructive distal LM, widely patent LAD and stent in mid vessel, severely disease small to moderate sized diagonal, heavily calcified ostial circ with 70% stenosis, 50-70% stenosis of a small OM2, widely patent RCA with moderate irregularities.  -Cardiothoracic surgery also consulted, surgery planned on 07/15/2014  Mental status/Transient Hallucination -Occurred on 07/09/2014, resolved -May have been secondary to medications versus delirium -CT of the head was negative  Transient hand weakness, numbness, likely TIA -Carotid Duplex: Bilateral: 1-39% ICA stenosis. Vertebral artery flow is antegrade. Right subclavian artery demonstrates significant stenosis compared to left subclavian artery. Medical management for subclavian artery stenosis.  -Hemoglobin A1c 5.6 -Continue with Lipitor. Follow LFT -TEE showed Aortic valve vegetation, treatment and plan as above -Neurology has signed off  Possible pneumonia vs TRALI from IVIG  -Bilateral effusions - stable   Diarrhea -Resolved.   Elevated troponin -in setting sepsis.  -ECHO normal Wall motion.  -Cardiology following, patient is high-risk for anticoagulation due to to GI bleed  Enterobacter Urine:  -30,000 colonies.   Chronic diastolic heart failure - on lasix at home  -Continue lasix (  80 mg ) on 8-5.  -Possibly secondary to aortic insufficiency and mitral regurg -Dyspnea resolved -Heart cath 07/29/2014 -Avoid ACEI due to AKI. -Continue diuresis with Lasix per cardiology  Severe Sepsis/Septic Shock -Secondary to Enterococcus bacteremia.   -Septic shock resolved, patient did require use of pressors, Levophed -Continue antibiotics as stated above -Blood pressure currently stable  Acute kidney injury -in setting of infection. Monitor on Lasix. Good  urine output.  -creatinine trending up today>> continue close monitoring on the by mouth Lasix  Thrombocytopenia,  -History of ITP, first IVIG treatment 8/3, on chronic prednisone.  -Dr. Marin Olp following and appreciated -Received one dose of Nplate and received epogen on 8/8 -Given a unit of platelets 07/30/2014. Will continue to monitor. -Patient may need additional units before surgery. -Platelets stable, 64 today 8/19, no gross bleeding  Suspected Adrenal insufficiency ( chronic steroids )  -Continue with prednisone.   History of paroxysmal atrial fibrillation  -Currently back in sinus.  -Pacemaker was removed, however per cardiology notes, patient will need placement of pacemaker post surgery.  -No anticoagulation at this time due to patient's thrombocytopenia.   Hypertension -Fairly controlled, continue amlodipine and lasix  Nasal congestion -Resolved, continue Flonase  Code Status: Full  Family Communication: None at bedside  Disposition Plan:  Remain in step down unit pending AV replacement.  Time Spent in minutes   25 minutes  Procedures 6/24 TTE >>> LVEF 55%, Grade 2 DD  8/3 CT chest >>> bilateral effusions, bilateral GGO, several nodular opacities  8/4 ECHO >>> Mild to mod MR, EF 50-55%, G2 Dia Dys Fcn  TEE: normal LV function; severe MR; large aortic valve vegetation with moderate AI; no pacemaker vegetation identified 8/8 Carotid doppler: Bilateral 1% to 39% ICA stenosis. There is a 50% to 99% right subclavian stenosis more proximal to the innominate artery based on turbulence . The left subclavian flow appears normal. Bilateral vertebral artery flow is antegrade. 8/11 Pacemaker extraction 8/13 PICC line placed 8/14 Heart cath>>nonobstructive distal LM, widely patent LAD and stent in mid vessel, severely disease small to moderate sized diagonal, heavily calcified ostial circ with 70% stenosis, 50-70% stenosis of a small OM2, widely patent RCA with moderate  irregularities.  8/16 Doppler (preop): Doppler waveforms remained normal with both radial and ulnar compressions on the right. Left Doppler waveform reversed with radial compression and remained normal with ulnar compression.   Consults   Cardiology Cardiothoracic surgery Neurology Infectious disease Oncology Dental  DVT Prophylaxis  SCDs  Lab Results  Component Value Date   PLT 64* 07/22/2014    Medications  Scheduled Meds: . amLODipine  10 mg Oral Daily  . ampicillin (OMNIPEN) IV  2 g Intravenous 4 times per day  . atorvastatin  10 mg Oral QPM  . chlorhexidine  15 mL Mouth/Throat BID  . feeding supplement (ENSURE COMPLETE)  237 mL Oral TID WC  . fluticasone  1 spray Each Nare Daily  . furosemide  60 mg Oral Daily  . gentamicin  100 mg Intravenous Q24H  . insulin aspart  0-9 Units Subcutaneous TID WC  . pantoprazole  40 mg Oral Daily  . predniSONE  10 mg Oral Q breakfast  . pyridOXINE  100 mg Oral QPM  . sodium chloride  10-40 mL Intracatheter Q12H  . tamsulosin  0.4 mg Oral QPC supper  . vitamin E  400 Units Oral Daily   Continuous Infusions:  PRN Meds:.acetaminophen, ondansetron (ZOFRAN) IV, polyvinyl alcohol, sodium chloride  Antibiotics    Anti-infectives  Start     Dose/Rate Route Frequency Ordered Stop   07/16/14 1300  gentamicin (GARAMYCIN) IVPB 100 mg     100 mg 200 mL/hr over 30 Minutes Intravenous Every 24 hours 07/15/14 1917     07/13/2014 1045  gentamicin (GARAMYCIN) 80 mg in sodium chloride irrigation 0.9 % 500 mL irrigation  Status:  Discontinued     80 mg Irrigation On call 07/31/2014 1031 07/13/2014 1805   07/12/14 1300  gentamicin (GARAMYCIN) IVPB 80 mg  Status:  Discontinued     80 mg 100 mL/hr over 30 Minutes Intravenous Every 24 hours 07/12/14 1200 07/15/14 1917   07/11/14 1200  ampicillin (OMNIPEN) 2 g in sodium chloride 0.9 % 50 mL IVPB     2 g 150 mL/hr over 20 Minutes Intravenous 4 times per day 07/11/14 0945     07/07/14 0030   piperacillin-tazobactam (ZOSYN) IVPB 3.375 g  Status:  Discontinued     3.375 g 12.5 mL/hr over 240 Minutes Intravenous Every 8 hours 07/09/2014 2042 07/08/14 1602   07/21/2014 2300  levofloxacin (LEVAQUIN) IVPB 750 mg  Status:  Discontinued     750 mg 100 mL/hr over 90 Minutes Intravenous Every 48 hours 07/16/2014 2232 07/08/14 1022   07/09/2014 1615  piperacillin-tazobactam (ZOSYN) IVPB 3.375 g     3.375 g 100 mL/hr over 30 Minutes Intravenous  Once 07/19/2014 1604 07/08/2014 1755   07/22/2014 1615  vancomycin (VANCOCIN) IVPB 1000 mg/200 mL premix     1,000 mg 200 mL/hr over 60 Minutes Intravenous  Once 07/08/2014 1604 07/11/2014 1930   07/05/2014 0030  vancomycin (VANCOCIN) IVPB 750 mg/150 ml premix  Status:  Discontinued     750 mg 150 mL/hr over 60 Minutes Intravenous Every 12 hours 07/23/2014 2042 07/11/14 0945        Subjective:   Patient sitting up in chair-states he ambulated in hallways this a.m. without difficulty. Denies chest pain or shortness of breath. Report still some leg swelling but less. She denies any gross bleeding.  Objective:   Filed Vitals:   07/22/14 0002 07/22/14 0400 07/22/14 0507 07/22/14 0745  BP:  131/53 131/53 122/65  Pulse:      Temp: 99 F (37.2 C)  98.8 F (37.1 C) 98.6 F (37 C)  TempSrc: Oral  Oral Oral  Resp:  11  15  Height:      Weight:   87 kg (191 lb 12.8 oz)   SpO2: 95%  99% 98%    Wt Readings from Last 3 Encounters:  07/22/14 87 kg (191 lb 12.8 oz)  07/22/14 87 kg (191 lb 12.8 oz)  07/22/14 87 kg (191 lb 12.8 oz)     Intake/Output Summary (Last 24 hours) at 07/22/14 0944 Last data filed at 07/22/14 0800  Gross per 24 hour  Intake   1600 ml  Output   1250 ml  Net    350 ml    Exam  General: Well developed, well nourished, NAD, appears stated age  78: NCAT, mucous membranes moist.   Cardiovascular: S1 S2 auscultated, Regular, 1/6 SEM  Respiratory: Crackles at bases, no wheezes  Abdomen: Soft, nontender, nondistended, + bowel  sounds  Extremities: warm dry without cyanosis clubbing. +1- 2 edema in LE B/L  Neuro: AAOx3, no focal deficits  Psych: Normal affect and demeanor with intact judgement and insight  Data Review   Micro Results Recent Results (from the past 240 hour(s))  FECAL OCCULT BLOOD, IMMUNOCHEMICAL     Status: None  Collection Time    07/13/14  5:24 PM      Result Value Ref Range Status   Fecal Occult Bld Negative  Negative Final  MRSA PCR SCREENING     Status: None   Collection Time    07/15/2014  6:17 PM      Result Value Ref Range Status   MRSA by PCR NEGATIVE  NEGATIVE Final   Comment:            The GeneXpert MRSA Assay (FDA     approved for NASAL specimens     only), is one component of a     comprehensive MRSA colonization     surveillance program. It is not     intended to diagnose MRSA     infection nor to guide or     monitor treatment for     MRSA infections.  SURGICAL PCR SCREEN     Status: None   Collection Time    07/21/14 11:05 AM      Result Value Ref Range Status   MRSA, PCR NEGATIVE  NEGATIVE Final   Staphylococcus aureus NEGATIVE  NEGATIVE Final   Comment:            The Xpert SA Assay (FDA     approved for NASAL specimens     in patients over 45 years of age),     is one component of     a comprehensive surveillance     program.  Test performance has     been validated by [employer] for patients greater     than or equal to 41 year old.     It is not intended     to diagnose infection nor to     guide or monitor treatment.    Radiology Reports Ct Abdomen Pelvis Wo Contrast  07/09/2014   CLINICAL DATA:  Enterococcus bacteremia. Evaluate for abdominal source. Evaluate for potential tumor. Enterobacter is also present in the urine.  EXAM: CT ABDOMEN AND PELVIS WITHOUT CONTRAST  TECHNIQUE: Multidetector CT imaging of the abdomen and pelvis was performed following the standard protocol without IV contrast.  COMPARISON:  No priors.  FINDINGS: Lung Bases:  Atherosclerotic calcifications in the right coronary artery. Pacemaker lead terminating in the right ventricular apex. Moderate-sized hiatal hernia. Moderate to large bilateral pleural effusions which appear to layer dependently and are associated with extensive passive atelectasis in the visualized portions of the lower lobes of the lungs bilaterally.  Abdomen/Pelvis: 9 mm low attenuation lesion in segment 2 of the liver is incompletely characterized on today's noncontrast CT examination, but is unchanged, and favored to represent a tiny cyst. No other focal hepatic lesions are identified on today's non contrast CT examination. There is a tiny calcifications in the head of the pancreas (image 31 of series 2) which appears to be immediately posterior to the distal common bile duct, potentially related to prior episodes of pancreatitis. Other small calcifications are also noted in the head and tail of the pancreas as well. The calcification in the head of the pancreas is not favored to be within the common bile duct, as the common bile duct is normal in caliber measuring 4 mm, and there is no intrahepatic biliary ductal dilatation to suggest obstruction. The unenhanced appearance of the gallbladder, spleen and bilateral adrenal glands is unremarkable. There is bilateral perinephric stranding which is nonspecific. 1.5 cm intermediate attenuation lesion extending exophytically off the lateral aspect of the  upper pole of the right kidney is incompletely characterized on today's non contrast CT examination, but is statistically likely to represent a mildly proteinaceous cyst.  Normal appendix. Trace volume of ascites. No pneumoperitoneum. No pathologic distention of small bowel. 10 mm short axis inguinal lymph nodes bilaterally are nonspecific. No other definite lymphadenopathy identified in the abdomen or pelvis on today's non contrast CT examination. Mild diffuse mesenteric edema. Prostate gland is enlarged measuring  4.9 x 6.1 cm. Urinary bladder is generally unremarkable in appearance, however, the attenuation values in the urine are rather high ranging from 45-55 HU, suggesting proteinaceous or hemorrhagic contents.  Musculoskeletal: Mild diffuse body wall edema. There are no aggressive appearing lytic or blastic lesions noted in the visualized portions of the skeleton.  IMPRESSION: 1. No definite source for bacteremia identified on today's examination. 2. Relatively high attenuation of the urine is of uncertain etiology and significance, but may suggest some proteinaceous or hemorrhagic contents. 3. Findings suggestive of anasarca, including moderate to large bilateral pleural effusions, trace volume of ascites, mild diffuse mesenteric edema and diffuse body wall edema. 4. Prostatomegaly. 5. Additional incidental findings, as above.   Electronically Signed   By: Vinnie Langton M.D.   On: 07/09/2014 12:46   Dg Chest 2 View  07/15/2014   CLINICAL DATA:  Pacemaker extraction  EXAM: CHEST  2 VIEW  COMPARISON:  07/07/2014  FINDINGS: Cardiac pacer has been removed with generator and 2 leads absent. Mild interstitial change in peribronchial cuffing. These findings are less prominent when compared to the prior study. There is consolidation in the medial left lung base which appears most consistent with atelectasis.  IMPRESSION: 1. Mild residual interstitial change may reflect minimal remaining interstitial pulmonary edema 2. Left lower lobe atelectasis   Electronically Signed   By: Skipper Cliche M.D.   On: 07/15/2014 08:15   Dg Chest 2 View  08/01/2014   CLINICAL DATA:  Fever, chills, shortness of breath.  EXAM: CHEST  2 VIEW  COMPARISON:  06/10/2014  FINDINGS: Left pacer in place with leads in the right atrium and right ventricle. Heart is normal size. Mild peribronchial thickening. No confluent airspace opacities or effusions. No acute bony abnormality.  IMPRESSION: Bronchitic changes.   Electronically Signed   By: Rolm Baptise M.D.   On: 07/10/2014 15:07   Ct Head Wo Contrast  07/09/2014   CLINICAL DATA:  Hallucinations.  Thrombocytopenia  EXAM: CT HEAD WITHOUT CONTRAST  TECHNIQUE: Contiguous axial images were obtained from the base of the skull through the vertex without intravenous contrast.  COMPARISON:  None.  FINDINGS: Mild atrophy. Negative for hemorrhage. Negative for acute infarct or mass. Calvarium intact.  IMPRESSION: No acute abnormality.   Electronically Signed   By: Franchot Gallo M.D.   On: 07/09/2014 18:47   Ct Angio Chest W/cm &/or Wo Cm  07/30/2014   CLINICAL DATA:  Shortness of breath and fever  EXAM: CT ANGIOGRAPHY CHEST WITH CONTRAST  TECHNIQUE: Multidetector CT imaging of the chest was performed using the standard protocol during bolus administration of intravenous contrast. Multiplanar CT image reconstructions and MIPs were obtained to evaluate the vascular anatomy.  CONTRAST:  31mL OMNIPAQUE IOHEXOL 350 MG/ML SOLN  COMPARISON:  Chest radiograph July 06, 2014  FINDINGS: There is no demonstrable pulmonary embolus. There is no thoracic aortic aneurysm or dissection.  There are free-flowing pleural effusions bilaterally. There is hazy ground-glass opacity diffusely.  On axial CT slice 55 series 6, there is a 6 x 4  mm nodular opacity in the medial segment of the right middle lobe. On axial slice 64 series 6, there is a 6 x 4 mm nodular opacity in the lateral segment of the left lower lobe. On axial slice 62 series 6, there is a 7 x 5 mm nodular opacity in the lateral segment of the right middle lobe. On axial slice 69 series 6, there is a 5 x 4 mm nodular opacity in the superior segment of the right lower lobe.  There is no appreciable thoracic adenopathy. Pericardium is not thickened. Pacemaker leads are attached to the right atrium and right ventricle. There is a focal hiatal hernia.  Visualized upper abdominal structures appear unremarkable. There are no blastic or lytic bone lesions. There is degenerative  change in the thoracic spine. Thyroid appears unremarkable.  Review of the MIP images confirms the above findings.  IMPRESSION: No demonstrable pulmonary embolus.  Bilateral effusions with extensive ground-glass opacity bilaterally. Suspect alveolar edema with findings consistent with congestive heart failure. Atypical infection or allergic type phenomenon are differential considerations, however.  Several nodular opacities, largest measuring 7 mm. Followup of these nodular opacity should be based on Fleischner Society guidelines. If the patient is at high risk for bronchogenic carcinoma, follow-up chest CT at 3-59months is recommended. If the patient is at low risk for bronchogenic carcinoma, follow-up chest CT at 6-12 months is recommended. This recommendation follows the consensus statement: Guidelines for Management of Small Pulmonary Nodules Detected on CT Scans: A Statement from the [place] as published in Radiology 2005; 237:395-400.  Focal hiatal hernia.  No adenopathy.   Electronically Signed   By: Lowella Grip M.D.   On: 07/28/2014 18:09   Dg Chest Port 1 View  07/07/2014   CLINICAL DATA:  Evaluate airspace.  EXAM: PORTABLE CHEST - 1 VIEW  COMPARISON:  CT 07/14/2014.  Chest x-ray 07/30/2014 and 06/10/2014.  FINDINGS: Cardiac pacer with lead tips in right atrium and right ventricle. Mediastinum and hilar structures are unremarkable. Cardiomegaly with mild pulmonary vascular prominence and interstitial prominence consistent with congestive heart failure. Small pleural effusions. No pneumothorax. Reference is made to recent chest CT which demonstrated small pulmonary nodules. These are not definitely identified by chest x-ray. No acute bony abnormality.  IMPRESSION: 1. Persistent changes of congestive heart failure pulmonary edema. Small pleural effusions. 2. Reference is made to recent chest CT which demonstrated small pulmonary nodules. Follow-up is recommended as on prior CT report.    Electronically Signed   By: Marcello Moores  Register   On: 07/07/2014 07:26    CBC  Recent Labs Lab 07/18/14 0500 07/19/14 0328 07/20/14 0300 07/21/14 0430 07/22/14 0445  WBC 7.8 7.8 6.4 5.2 5.2  HGB 10.0* 10.7* 10.4* 10.6* 10.5*  HCT 31.8* 34.0* 33.4* 33.1* 32.7*  PLT 54* 57* 59* 66* 64*  MCV 105.3* 105.6* 105.4* 106.4* 105.5*  MCH 33.1 33.2 32.8 34.1* 33.9  MCHC 31.4 31.5 31.1 32.0 32.1  RDW 24.3* 23.8* 23.5* 23.2* 23.1*    Chemistries   Recent Labs Lab 07/18/14 0500 07/19/14 0328 07/20/14 0300 07/21/14 0430 07/22/14 0445  NA 139 137 135* 138 140  K 4.1 4.1 4.0 4.0 4.2  CL 101 100 97 96 99  CO2 27 28 29  32 33*  GLUCOSE 96 111* 95 96 90  BUN 23 25* 27* 28* 34*  CREATININE 1.45* 1.53* 1.57* 1.55* 1.84*  CALCIUM 8.4 8.4 8.3* 8.7 8.3*  AST 41* 44* 43* 50* 45*  ALT 34 37 34 37 32  ALKPHOS 62 69 64 66 61  BILITOT 0.6 0.6 0.5 0.6 0.4   ------------------------------------------------------------------------------------------------------------------ estimated creatinine clearance is 34.7 ml/min (by C-G formula based on Cr of 1.84). ------------------------------------------------------------------------------------------------------------------  Recent Labs  07/21/14 0430  HGBA1C 5.4   ------------------------------------------------------------------------------------------------------------------ No results found for this basename: CHOL, HDL, LDLCALC, TRIG, CHOLHDL, LDLDIRECT,  in the last 72 hours ------------------------------------------------------------------------------------------------------------------ No results found for this basename: TSH, T4TOTAL, FREET3, T3FREE, THYROIDAB,  in the last 72 hours ------------------------------------------------------------------------------------------------------------------ No results found for this basename: VITAMINB12, FOLATE, FERRITIN, TIBC, IRON, RETICCTPCT,  in the last 72 hours  Coagulation profile  Recent  Labs Lab 07/16/14 2200 07/21/14 0430  INR 1.21 1.20    No results found for this basename: DDIMER,  in the last 72 hours  Cardiac Enzymes No results found for this basename: CK, CKMB, TROPONINI, MYOGLOBIN,  in the last 168 hours ------------------------------------------------------------------------------------------------------------------ No components found with this basename: POCBNP,     Amerika Nourse C D.O. on 07/22/2014 at 9:44 AM  Between 7am to 7pm - Pager - [phone]  After 7pm go to [email or web address] - password [email or web address]  And look for the night coverage person covering for me after hours  [facility] Office  [phone]

## 2014-07-22 NOTE — Progress Notes (Signed)
SUBJECTIVE:  The patient denies CP or SOB. Sinus drainage.   Scheduled Meds: . amLODipine  10 mg Oral Daily  . ampicillin (OMNIPEN) IV  2 g Intravenous 4 times per day  . atorvastatin  10 mg Oral QPM  . chlorhexidine  15 mL Mouth/Throat BID  . feeding supplement (ENSURE COMPLETE)  237 mL Oral TID WC  . fluticasone  1 spray Each Nare Daily  . furosemide  60 mg Oral Daily  . gentamicin  100 mg Intravenous Q24H  . insulin aspart  0-9 Units Subcutaneous TID WC  . pantoprazole  40 mg Oral Daily  . predniSONE  10 mg Oral Q breakfast  . pyridOXINE  100 mg Oral QPM  . sodium chloride  10-40 mL Intracatheter Q12H  . tamsulosin  0.4 mg Oral QPC supper  . vitamin E  400 Units Oral Daily   OBJECTIVE:    Vitals:   Filed Vitals:   07/22/14 0002 07/22/14 0400 07/22/14 0507 07/22/14 0745  BP:  131/53 131/53 122/65  Pulse:      Temp: 99 F (37.2 C)  98.8 F (37.1 C) 98.6 F (37 C)  TempSrc: Oral  Oral Oral  Resp:  11  15  Height:      Weight:   191 lb 12.8 oz (87 kg)   SpO2: 95%  99% 98%   I&O's:    Intake/Output Summary (Last 24 hours) at 07/22/14 1014 Last data filed at 07/22/14 0950  Gross per 24 hour  Intake   1620 ml  Output   1250 ml  Net    370 ml   TELEMETRY: Reviewed telemetry pt in NSR:  PHYSICAL EXAM General: Well developed, well nourished, in no acute distress Head: Eyes PERRLA, No xanthomas.   Normal cephalic and atramatic  Lungs:   Crackles at the bases. Heart:   HRRR S1 S2 Pulses are 2+ & equal. 2/6 SM at RUSB to LLSB Abdomen: Bowel sounds are positive, abdomen soft and non-tender without masses  Extremities:   1+ pitting edema up to the mid calves Neuro: Alert and oriented X 3. Psych:  Good affect, responds appropriately  LABS: Basic Metabolic Panel:  Recent Labs  07/21/14 0430 07/22/14 0445  NA 138 140  K 4.0 4.2  CL 96 99  CO2 32 33*  GLUCOSE 96 90  BUN 28* 34*  CREATININE 1.55* 1.84*  CALCIUM 8.7 8.3*   Liver Function Tests:  Recent  Labs  07/21/14 0430 07/22/14 0445  AST 50* 45*  ALT 37 32  ALKPHOS 66 61  BILITOT 0.6 0.4  PROT 6.8 6.4  ALBUMIN 2.3* 2.2*   CBC:  Recent Labs  07/21/14 0430 07/22/14 0445  WBC 5.2 5.2  HGB 10.6* 10.5*  HCT 33.1* 32.7*  MCV 106.4* 105.5*  PLT 66* 64*   Coag Panel:   Lab Results  Component Value Date   INR 1.20 07/21/2014   INR 1.21 07/16/2014   INR 1.48 07/31/2014   Telemetry: A-fib, rate controlled    Assessment/Plan:   1. Subacute bacterial endocarditis-enterococcus on initial blood cultures with most recent cultures negative. Patient afebrile. Continue ampicillin and gentamicin. Infectious disease following. Transesophageal echocardiogram showed aortic valve vegetation with moderate AI and severe MR. Patient will need AVR and either MVR or mitral valve repair. His pacemaker has been removed. Cardiac catheterization showed nonobstructive distal LM, widely patent LAD and stent in mid vessel, severely disease small to moderate sized diagonal, heavily calcified ostial circ with 70% stenosis, 50-70%  stenosis of a small OM2, widely patent RCA with moderate irregularities. Plan for AVR/MVR vs repair later this week Per Dr Ramonita Lab or Ludwig Clarks).  2. Paroxysmal atrial fibrillation with post-termination pauses -anticoagulation is on hold given thrombocytopenia. Pacemaker has been removed. Telemetry has shown some brief episodes of paroxysmal atrial fibrillation. Will require Pacemaker replacement once he recovers from valve surgery. No anticoagulation at present due to thrombocytopenia.   3. Stage III CKD - follow renal function closely given use of gentamicin. Creatinine stable post cath   4. 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 Hematology following and managing. Plt count up to 56K on steroids  5. Hypertension-given aortic insufficiency and mitral regurgitation need to have blood pressure well controlled. We will avoid beta blockade given history of bradycardia. Avoid ACE inhibitors given  renal insufficiency.   6. Chronic diastolic heart failure - significant improvement of volume status, -3.5 Liters in the last 2 days, however still fluid overloaded, We will restart Lasix 60 mg po BID. His heart failure is most likely from his mitral regurgitation and aortic insufficiency. Follow Crea closely.   [name], MD  07/22/2014  10:14 AM

## 2014-07-22 NOTE — Progress Notes (Signed)
CARDIAC REHAB PHASE I   PRE:  Rate/Rhythm: 82 first deg    BP: sitting 139/37    SaO2:   MODE:  Ambulation: 1400 ft   POST:  Rate/Rhythm: 87 first degree    BP: sitting 133/51     SaO2:   Tolerated well, no c/o. Discussed sternal precautions, mobility post-op, and IS. Practiced IS, gave OHS and guideline and also video to watch. Pt asks [place] to reinforce information with his wife tomorrow. Will f/u. 1425-1500   Josephina Shih [place] CES, ACSM 07/22/2014 2:57 PM

## 2014-07-23 ENCOUNTER — Encounter (HOSPITAL_COMMUNITY): Payer: Self-pay | Admitting: Thoracic Surgery (Cardiothoracic Vascular Surgery)

## 2014-07-23 ENCOUNTER — Encounter: Payer: Self-pay | Admitting: Hematology & Oncology

## 2014-07-23 DIAGNOSIS — I330 Acute and subacute infective endocarditis: Secondary | ICD-10-CM | POA: Diagnosis present

## 2014-07-23 DIAGNOSIS — I351 Nonrheumatic aortic (valve) insufficiency: Secondary | ICD-10-CM | POA: Diagnosis present

## 2014-07-23 DIAGNOSIS — I200 Unstable angina: Secondary | ICD-10-CM

## 2014-07-23 DIAGNOSIS — N189 Chronic kidney disease, unspecified: Secondary | ICD-10-CM | POA: Diagnosis present

## 2014-07-23 DIAGNOSIS — I359 Nonrheumatic aortic valve disorder, unspecified: Secondary | ICD-10-CM

## 2014-07-23 LAB — POCT I-STAT 3, ART BLOOD GAS (G3+)
ACID-BASE EXCESS: 10 mmol/L — AB (ref 0.0–2.0)
BICARBONATE: 33.5 meq/L — AB (ref 20.0–24.0)
O2 Saturation: 97 %
PO2 ART: 74 mmHg — AB (ref 80.0–100.0)
Patient temperature: 97.8
TCO2: 35 mmol/L (ref 0–100)
pCO2 arterial: 39.2 mmHg (ref 35.0–45.0)
pH, Arterial: 7.538 — ABNORMAL HIGH (ref 7.350–7.450)

## 2014-07-23 LAB — CBC
HCT: 31.3 % — ABNORMAL LOW (ref 39.0–52.0)
HEMOGLOBIN: 10.2 g/dL — AB (ref 13.0–17.0)
MCH: 34.3 pg — ABNORMAL HIGH (ref 26.0–34.0)
MCHC: 32.6 g/dL (ref 30.0–36.0)
MCV: 105.4 fL — ABNORMAL HIGH (ref 78.0–100.0)
PLATELETS: 66 10*3/uL — AB (ref 150–400)
RBC: 2.97 MIL/uL — ABNORMAL LOW (ref 4.22–5.81)
RDW: 22.5 % — ABNORMAL HIGH (ref 11.5–15.5)
WBC: 4.4 10*3/uL (ref 4.0–10.5)

## 2014-07-23 LAB — COMPREHENSIVE METABOLIC PANEL
ALK PHOS: 63 U/L (ref 39–117)
ALT: 31 U/L (ref 0–53)
AST: 48 U/L — AB (ref 0–37)
Albumin: 2.3 g/dL — ABNORMAL LOW (ref 3.5–5.2)
Anion gap: 11 (ref 5–15)
BUN: 33 mg/dL — ABNORMAL HIGH (ref 6–23)
CALCIUM: 8.6 mg/dL (ref 8.4–10.5)
CO2: 32 mEq/L (ref 19–32)
Chloride: 97 mEq/L (ref 96–112)
Creatinine, Ser: 1.7 mg/dL — ABNORMAL HIGH (ref 0.50–1.35)
GFR calc Af Amer: 42 mL/min — ABNORMAL LOW (ref >90)
GFR, EST NON AFRICAN AMERICAN: 37 mL/min — AB (ref >90)
Glucose, Bld: 76 mg/dL (ref 70–99)
Potassium: 4 mEq/L (ref 3.7–5.3)
SODIUM: 140 meq/L (ref 137–147)
Total Bilirubin: 0.4 mg/dL (ref 0.3–1.2)
Total Protein: 6.4 g/dL (ref 6.0–8.3)

## 2014-07-23 LAB — GLUCOSE, CAPILLARY
GLUCOSE-CAPILLARY: 164 mg/dL — AB (ref 70–99)
GLUCOSE-CAPILLARY: 166 mg/dL — AB (ref 70–99)
GLUCOSE-CAPILLARY: 178 mg/dL — AB (ref 70–99)
Glucose-Capillary: 80 mg/dL (ref 70–99)

## 2014-07-23 LAB — LACTATE DEHYDROGENASE: LDH: 474 U/L — ABNORMAL HIGH (ref 94–250)

## 2014-07-23 MED ORDER — EPINEPHRINE HCL 1 MG/ML IJ SOLN
0.5000 ug/min | INTRAMUSCULAR | Status: DC
  Filled 2014-07-23: qty 4

## 2014-07-23 MED ORDER — VANCOMYCIN HCL 1000 MG IV SOLR
INTRAVENOUS | Status: AC
  Administered 2014-07-24: 11:00:00
  Filled 2014-07-23: qty 1000

## 2014-07-23 MED ORDER — SODIUM CHLORIDE 0.9 % IV SOLN
INTRAVENOUS | Status: AC
  Administered 2014-07-24: 1.4 [IU]/h via INTRAVENOUS
  Filled 2014-07-23: qty 2.5

## 2014-07-23 MED ORDER — PHENYLEPHRINE HCL 10 MG/ML IJ SOLN
30.0000 ug/min | INTRAVENOUS | Status: AC
  Administered 2014-07-24: 20 ug/min via INTRAVENOUS
  Filled 2014-07-23: qty 2

## 2014-07-23 MED ORDER — SODIUM CHLORIDE 0.9 % IV SOLN
INTRAVENOUS | Status: DC
  Filled 2014-07-23: qty 30

## 2014-07-23 MED ORDER — DOPAMINE-DEXTROSE 3.2-5 MG/ML-% IV SOLN
2.0000 ug/kg/min | INTRAVENOUS | Status: AC
  Administered 2014-07-24: 3 ug/kg/min via INTRAVENOUS
  Filled 2014-07-23: qty 250

## 2014-07-23 MED ORDER — DEXTROSE 5 % IV SOLN
750.0000 mg | INTRAVENOUS | Status: DC
  Filled 2014-07-23: qty 750

## 2014-07-23 MED ORDER — PLASMA-LYTE 148 IV SOLN
INTRAVENOUS | Status: AC
  Administered 2014-07-24: 11:00:00
  Filled 2014-07-23: qty 2.5

## 2014-07-23 MED ORDER — POTASSIUM CHLORIDE 2 MEQ/ML IV SOLN
80.0000 meq | INTRAVENOUS | Status: DC
  Filled 2014-07-23: qty 40

## 2014-07-23 MED ORDER — SODIUM CHLORIDE 0.9 % IV SOLN
INTRAVENOUS | Status: AC
  Administered 2014-07-24: 13:00:00 via INTRAVENOUS
  Administered 2014-07-24: 69.8 mL/h via INTRAVENOUS
  Filled 2014-07-23: qty 40

## 2014-07-23 MED ORDER — AMLODIPINE BESYLATE 5 MG PO TABS
5.0000 mg | ORAL_TABLET | Freq: Every day | ORAL | Status: DC
  Administered 2014-07-23: 5 mg via ORAL
  Filled 2014-07-23 (×2): qty 1

## 2014-07-23 MED ORDER — CHLORHEXIDINE GLUCONATE 4 % EX LIQD
60.0000 mL | Freq: Once | CUTANEOUS | Status: AC
  Administered 2014-07-24: 4 via TOPICAL
  Filled 2014-07-23: qty 60

## 2014-07-23 MED ORDER — VANCOMYCIN HCL 10 G IV SOLR
1250.0000 mg | INTRAVENOUS | Status: AC
  Administered 2014-07-24: 1250 mg via INTRAVENOUS
  Filled 2014-07-23: qty 1250

## 2014-07-23 MED ORDER — CEFUROXIME SODIUM 1.5 G IJ SOLR
1.5000 g | INTRAMUSCULAR | Status: AC
  Administered 2014-07-24: 1.5 g via INTRAVENOUS
  Administered 2014-07-24: .75 g via INTRAVENOUS
  Filled 2014-07-23: qty 1.5

## 2014-07-23 MED ORDER — METOPROLOL TARTRATE 12.5 MG HALF TABLET
12.5000 mg | ORAL_TABLET | Freq: Once | ORAL | Status: AC
  Administered 2014-07-24: 12.5 mg via ORAL
  Filled 2014-07-23: qty 1

## 2014-07-23 MED ORDER — DEXMEDETOMIDINE HCL IN NACL 400 MCG/100ML IV SOLN
0.1000 ug/kg/h | INTRAVENOUS | Status: AC
  Administered 2014-07-24: 0.2 ug/kg/h via INTRAVENOUS
  Filled 2014-07-23: qty 100

## 2014-07-23 MED ORDER — NITROGLYCERIN IN D5W 200-5 MCG/ML-% IV SOLN
2.0000 ug/min | INTRAVENOUS | Status: AC
  Administered 2014-07-24: 5 ug/min via INTRAVENOUS
  Filled 2014-07-23: qty 250

## 2014-07-23 MED ORDER — MAGNESIUM SULFATE 50 % IJ SOLN
40.0000 meq | INTRAMUSCULAR | Status: DC
  Filled 2014-07-23: qty 10

## 2014-07-23 MED ORDER — CHLORHEXIDINE GLUCONATE 4 % EX LIQD
60.0000 mL | Freq: Once | CUTANEOUS | Status: AC
  Administered 2014-07-23: 4 via TOPICAL
  Filled 2014-07-23: qty 60

## 2014-07-23 MED ORDER — TEMAZEPAM 15 MG PO CAPS
15.0000 mg | ORAL_CAPSULE | Freq: Once | ORAL | Status: AC | PRN
Start: 2014-07-23 — End: 2014-07-23

## 2014-07-23 MED ORDER — BISACODYL 5 MG PO TBEC
5.0000 mg | DELAYED_RELEASE_TABLET | Freq: Once | ORAL | Status: AC
  Administered 2014-07-23: 5 mg via ORAL
  Filled 2014-07-23: qty 1

## 2014-07-23 NOTE — Progress Notes (Signed)
      [address]       [place] [address]             [phone]     CARDIOTHORACIC SURGERY PROGRESS NOTE  6 Days Post-Op  S/P Procedure(s) (LRB): CORONARY ANGIOGRAM (N/A)  Subjective: No complaints.  No SOB.  Objective: Vital signs in last 24 hours: Temp:  [97.7 F (36.5 C)-98.2 F (36.8 C)] 98.1 F (36.7 C) (08/20 1622) Pulse Rate:  [80] 80 (08/19 2317) Cardiac Rhythm:  [-] Heart block (08/20 0900) Resp:  [16-18] 18 (08/20 1622) BP: (126-166)/(32-59) 128/32 mmHg (08/20 1622) SpO2:  [94 %-100 %] 100 % (08/20 1622) Weight:  [86.5 kg (190 lb 11.2 oz)] 86.5 kg (190 lb 11.2 oz) (08/20 0500)  Physical Exam:  Rhythm:   sinus  Breath sounds: clear  Heart sounds:  RRR w/ systolic murmur  Incisions:  Left chest dressing intact  Abdomen:  soft  Extremities:  warm   Intake/Output from previous day: 08/19 0701 - 08/20 0700 In: 1780 [P.O.:1440; I.V.:40; IV Piggyback:300] Out: 2275 [Urine:2275] Intake/Output this shift: Total I/O In: 240 [P.O.:240] Out: 500 [Urine:500]  Lab Results:  Recent Labs  07/22/14 0445 07/23/14 0530  WBC 5.2 4.4  HGB 10.5* 10.2*  HCT 32.7* 31.3*  PLT 64* 66*   BMET:  Recent Labs  07/22/14 0445 07/23/14 0530  NA 140 140  K 4.2 4.0  CL 99 97  CO2 33* 32  GLUCOSE 90 76  BUN 34* 33*  CREATININE 1.84* 1.70*  CALCIUM 8.3* 8.6    CBG (last 3)   Recent Labs  07/23/14 0721 07/23/14 1245 07/23/14 1620  GLUCAP 80 164* 166*   PT/INR:   Recent Labs  07/21/14 0430  LABPROT 15.2  INR 1.20    CXR:  N/A  Assessment/Plan: S/P Procedure(s) (LRB): CORONARY ANGIOGRAM (N/A)  I have again reviewed the indications, risks, and potential benefits of surgery at length with the patient and his wife this afternoon. Alternative treatment strategies been discussed. We plan to proceed with aortic valve repair or replacement, mitral valve repair or replacement, coronary artery bypass grafting, and Maze procedure. The patient  understands that is relatively unlikely that his aortic valve will be repairable. Under the circumstances we would plan to replace it using a bioprosthetic tissue valve. On the other hand, I feel there is a high likelihood that the patient's mitral valve should be repairable.   They understand and accept all potential risks of surgery including but not limited to risk of death, stroke or other neurologic complication, myocardial infarction, congestive heart failure, respiratory failure, renal failure, bleeding requiring transfusion and/or reexploration, arrhythmia, infection or other wound complications, pneumonia, pleural and/or pericardial effusion, pulmonary embolus, aortic dissection or other major vascular complication, or delayed complications related to valve repair or replacement including but not limited to structural valve deterioration and failure, thrombosis, embolization, recurrent endocarditis, or paravalvular leak.  All of their questions have been answered.  I spent in excess of 30 minutes during the conduct of this hospital encounter and >50% of this time involved direct face-to-face encounter with the patient for counseling and/or coordination of their care.    [name],[name] 07/23/2014 4:44 PM

## 2014-07-23 NOTE — Progress Notes (Signed)
[facility]                                                                              Patient Demographics  Justin [name], is a 80 y.o. male, DOB - 1933/04/07, JJH:[id number]  Admit date - 07/26/2014   Admitting Physician Rigoberto Noel, MD  Outpatient Primary MD for the patient is Penni Homans, MD  LOS - 35   Chief Complaint  Patient presents with  . Shortness of Breath      HPI on 07/17/2014 80 y.o. male with history of coronary artery disease status post stents, hypertension, dyslipidemia, atrial fibrillation no longer on anticoagulation secondary to recent GI bleed, pacemaker, ITP currently being treated with IVIG by Dr. Marin Olp (most recently 8/3) and prednisone 80mg  daily, presented to the emergency department with complaints of fever and shortness of breath that started 8/2. In ED, patient found to have leukocytosis, hypotension, and lactic acidosis. Given 1L IVF bolus and started on levophed and was transferred to MICU for suspected sepsis.   Interim history Patient was hypotensive and required pressors. he was found to have septic shock with enterococcus bacteremia, infectious disease was consulted. TEE was recommended which showed a large aortic valve vegetation. 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 Patient was noted to have an episode of TIA, and neurology was consulted. Patient's pacemaker was removed on 07/13/2014. Cardiothoracic surgery was also consulted for further evaluation. Patient had PICC placed on 07/16/14.  He will have a heart cath 07/04/2014.  Plan per Dr Roxy Manns is for pt to have AV and MV repair, CABG on 07/26/2014   Assessment & Plan   Enterococcus Bacteremia with Aortic Valve Vegetation:  -Received IV vancomycin for 4 days. Repeated Blood Culture 8-5 no growth to date. WBC trending down. -CT abdomen no mas.  -S/P TEE which showed Large Aortic Valve Vegetation.    -Has PICC line, double lumen. -Vancomycin was changed to ampicillin 8-8. Gentamycin added on  8-9.  -HIV non reactive, viral hepatitis negative. .  -Heart cath on 07/29/2014 showed: nonobstructive distal LM, widely patent LAD and stent in mid vessel, severely disease small to moderate sized diagonal, heavily calcified ostial circ with 70% stenosis, 50-70% stenosis of a small OM2, widely patent RCA with moderate irregularities.  -Cardiothoracic surgery also consulted, surgery planned on 07/05/2014  Mental status/Transient Hallucination -Occurred on 07/09/2014, resolved -May have been secondary to medications versus delirium -CT of the head was negative  Transient hand weakness, numbness, likely TIA -Carotid Duplex: Bilateral: 1-39% ICA stenosis. Vertebral artery flow is antegrade. Right subclavian artery demonstrates significant stenosis compared to left subclavian artery. Medical management for subclavian artery stenosis.  -Hemoglobin A1c 5.6 -Continue with Lipitor. Follow LFT -TEE showed Aortic valve vegetation, treatment and plan as above -Neurology has signed off  Possible pneumonia vs TRALI from IVIG  -Bilateral effusions - stable   Diarrhea -Resolved.   Elevated troponin -in setting sepsis.  -ECHO normal Wall motion.  -Cardiology following, patient is high-risk for anticoagulation due to to GI bleed  Enterobacter Urine:  -30,000 colonies.   Chronic diastolic heart failure - on lasix at home  -  Continue lasix (80 mg ) on 8-5.  -Possibly secondary to aortic insufficiency and mitral regurg -Dyspnea resolved -Heart cath 07/30/2014 -Avoid ACEI due to AKI. -better diuresis overnight and cr stable  -appreciate cards assistance  Severe Sepsis/Septic Shock -Secondary to Enterococcus bacteremia.   -Septic shock resolved, patient did require use of pressors, Levophed -Continue antibiotics as stated above -Blood pressure currently stable  Acute kidney injury -in setting of  infection. Monitor on Lasix. Good urine output.  -creatinine stable today>> continue close monitoring on the by mouth Lasix  Thrombocytopenia,  -History of ITP, first IVIG treatment 8/3, on chronic prednisone.  -Received one dose of Nplate and received epogen on 8/8 -Given a unit of platelets 07/29/2014. Will continue to monitor. -Patient may need additional units before surgery. -Platelets stable today, no gross bleeding -Dr. Marin Olp following and appreciated  Suspected Adrenal insufficiency ( chronic steroids )  -Continue with prednisone.   History of paroxysmal atrial fibrillation  -Currently back in sinus.  -Pacemaker was removed, however per cardiology notes, patient will need placement of pacemaker post surgery.  -No anticoagulation at this time due to patient's thrombocytopenia.   Hypertension -Fairly controlled, continue amlodipine and lasix  Nasal congestion -Resolved, continue Flonase  Code Status: Full  Family Communication: None at bedside  Disposition Plan:  Remain in step down unit pending AV replacement.  Time Spent in minutes   25 minutes  Procedures 6/24 TTE >>> LVEF 55%, Grade 2 DD  8/3 CT chest >>> bilateral effusions, bilateral GGO, several nodular opacities  8/4 ECHO >>> Mild to mod MR, EF 50-55%, G2 Dia Dys Fcn  TEE: normal LV function; severe MR; large aortic valve vegetation with moderate AI; no pacemaker vegetation identified 8/8 Carotid doppler: Bilateral 1% to 39% ICA stenosis. There is a 50% to 99% right subclavian stenosis more proximal to the innominate artery based on turbulence . The left subclavian flow appears normal. Bilateral vertebral artery flow is antegrade. 8/11 Pacemaker extraction 8/13 PICC line placed 8/14 Heart cath>>nonobstructive distal LM, widely patent LAD and stent in mid vessel, severely disease small to moderate sized diagonal, heavily calcified ostial circ with 70% stenosis, 50-70% stenosis of a small OM2, widely patent RCA  with moderate irregularities.  8/16 Doppler (preop): Doppler waveforms remained normal with both radial and ulnar compressions on the right. Left Doppler waveform reversed with radial compression and remained normal with ulnar compression.   Consults   Cardiology Cardiothoracic surgery Neurology Infectious disease Oncology Dental  DVT Prophylaxis  SCDs  Lab Results  Component Value Date   PLT 66* 07/23/2014    Medications  Scheduled Meds: . amLODipine  2.5 mg Oral Daily  . ampicillin (OMNIPEN) IV  2 g Intravenous 4 times per day  . atorvastatin  10 mg Oral QPM  . chlorhexidine  15 mL Mouth/Throat BID  . feeding supplement (ENSURE COMPLETE)  237 mL Oral TID WC  . fluticasone  1 spray Each Nare Daily  . furosemide  60 mg Oral BID  . gentamicin  100 mg Intravenous Q24H  . insulin aspart  0-9 Units Subcutaneous TID WC  . pantoprazole  40 mg Oral Daily  . predniSONE  10 mg Oral Q breakfast  . pyridOXINE  100 mg Oral QPM  . sodium chloride  10-40 mL Intracatheter Q12H  . tamsulosin  0.4 mg Oral QPC supper  . vitamin E  400 Units Oral Daily   Continuous Infusions:  PRN Meds:.acetaminophen, ondansetron (ZOFRAN) IV, polyvinyl alcohol, sodium chloride  Antibiotics  Anti-infectives   Start     Dose/Rate Route Frequency Ordered Stop   07/16/14 1300  gentamicin (GARAMYCIN) IVPB 100 mg     100 mg 200 mL/hr over 30 Minutes Intravenous Every 24 hours 07/15/14 1917     07/05/2014 1045  gentamicin (GARAMYCIN) 80 mg in sodium chloride irrigation 0.9 % 500 mL irrigation  Status:  Discontinued     80 mg Irrigation On call 07/31/2014 1031 07/13/2014 1805   07/12/14 1300  gentamicin (GARAMYCIN) IVPB 80 mg  Status:  Discontinued     80 mg 100 mL/hr over 30 Minutes Intravenous Every 24 hours 07/12/14 1200 07/15/14 1917   07/11/14 1200  ampicillin (OMNIPEN) 2 g in sodium chloride 0.9 % 50 mL IVPB     2 g 150 mL/hr over 20 Minutes Intravenous 4 times per day 07/11/14 0945     07/07/14 0030   piperacillin-tazobactam (ZOSYN) IVPB 3.375 g  Status:  Discontinued     3.375 g 12.5 mL/hr over 240 Minutes Intravenous Every 8 hours 07/26/2014 2042 07/08/14 1602   08/01/2014 2300  levofloxacin (LEVAQUIN) IVPB 750 mg  Status:  Discontinued     750 mg 100 mL/hr over 90 Minutes Intravenous Every 48 hours 07/08/2014 2232 07/08/14 1022   07/18/2014 1615  piperacillin-tazobactam (ZOSYN) IVPB 3.375 g     3.375 g 100 mL/hr over 30 Minutes Intravenous  Once 07/27/2014 1604 07/28/2014 1755   07/05/2014 1615  vancomycin (VANCOCIN) IVPB 1000 mg/200 mL premix     1,000 mg 200 mL/hr over 60 Minutes Intravenous  Once 07/26/2014 1604 07/23/14 0709   07/11/2014 0030  vancomycin (VANCOCIN) IVPB 750 mg/150 ml premix  Status:  Discontinued     750 mg 150 mL/hr over 60 Minutes Intravenous Every 12 hours 08/03/2014 2042 07/11/14 0945        Subjective:   States he rested better last PM. Denies any new c/o.  Objective:   Filed Vitals:   07/23/14 0315 07/23/14 0500 07/23/14 0722 07/23/14 0723  BP: 166/49  144/40 144/40  Pulse:      Temp:  97.7 F (36.5 C)  98 F (36.7 C)  TempSrc:  Oral  Oral  Resp: 18   17  Height:      Weight:  86.5 kg (190 lb 11.2 oz)    SpO2: 98%   94%    Wt Readings from Last 3 Encounters:  07/23/14 86.5 kg (190 lb 11.2 oz)  07/23/14 86.5 kg (190 lb 11.2 oz)  07/23/14 86.5 kg (190 lb 11.2 oz)     Intake/Output Summary (Last 24 hours) at 07/23/14 0859 Last data filed at 07/23/14 0600  Gross per 24 hour  Intake   1300 ml  Output   2175 ml  Net   -875 ml    Exam  General: Well developed, well nourished, NAD, appears stated age  71: NCAT, mucous membranes moist.   Cardiovascular: S1 S2 auscultated, Regular, 1/6 SEM  Respiratory: Crackles at bases, no wheezes  Abdomen: Soft, nontender, nondistended, + bowel sounds  Extremities: warm dry without cyanosis clubbing. +1- 2 edema in LE B/L  Neuro: AAOx3, no focal deficits  Psych: Normal affect and demeanor with intact  judgement and insight  Data Review   Micro Results Recent Results (from the past 240 hour(s))  FECAL OCCULT BLOOD, IMMUNOCHEMICAL     Status: None   Collection Time    07/13/14  5:24 PM      Result Value Ref Range Status   Fecal  Occult Bld Negative  Negative Final  MRSA PCR SCREENING     Status: None   Collection Time    07/04/2014  6:17 PM      Result Value Ref Range Status   MRSA by PCR NEGATIVE  NEGATIVE Final   Comment:            The GeneXpert MRSA Assay (FDA     approved for NASAL specimens     only), is one component of a     comprehensive MRSA colonization     surveillance program. It is not     intended to diagnose MRSA     infection nor to guide or     monitor treatment for     MRSA infections.  SURGICAL PCR SCREEN     Status: None   Collection Time    07/21/14 11:05 AM      Result Value Ref Range Status   MRSA, PCR NEGATIVE  NEGATIVE Final   Staphylococcus aureus NEGATIVE  NEGATIVE Final   Comment:            The Xpert SA Assay (FDA     approved for NASAL specimens     in patients over 40 years of age),     is one component of     a comprehensive surveillance     program.  Test performance has     been validated by [employer] for patients greater     than or equal to 43 year old.     It is not intended     to diagnose infection nor to     guide or monitor treatment.    Radiology Reports Ct Abdomen Pelvis Wo Contrast  07/09/2014   CLINICAL DATA:  Enterococcus bacteremia. Evaluate for abdominal source. Evaluate for potential tumor. Enterobacter is also present in the urine.  EXAM: CT ABDOMEN AND PELVIS WITHOUT CONTRAST  TECHNIQUE: Multidetector CT imaging of the abdomen and pelvis was performed following the standard protocol without IV contrast.  COMPARISON:  No priors.  FINDINGS: Lung Bases: Atherosclerotic calcifications in the right coronary artery. Pacemaker lead terminating in the right ventricular apex. Moderate-sized hiatal hernia. Moderate to large  bilateral pleural effusions which appear to layer dependently and are associated with extensive passive atelectasis in the visualized portions of the lower lobes of the lungs bilaterally.  Abdomen/Pelvis: 9 mm low attenuation lesion in segment 2 of the liver is incompletely characterized on today's noncontrast CT examination, but is unchanged, and favored to represent a tiny cyst. No other focal hepatic lesions are identified on today's non contrast CT examination. There is a tiny calcifications in the head of the pancreas (image 31 of series 2) which appears to be immediately posterior to the distal common bile duct, potentially related to prior episodes of pancreatitis. Other small calcifications are also noted in the head and tail of the pancreas as well. The calcification in the head of the pancreas is not favored to be within the common bile duct, as the common bile duct is normal in caliber measuring 4 mm, and there is no intrahepatic biliary ductal dilatation to suggest obstruction. The unenhanced appearance of the gallbladder, spleen and bilateral adrenal glands is unremarkable. There is bilateral perinephric stranding which is nonspecific. 1.5 cm intermediate attenuation lesion extending exophytically off the lateral aspect of the upper pole of the right kidney is incompletely characterized on today's non contrast CT examination, but is statistically likely to represent a  mildly proteinaceous cyst.  Normal appendix. Trace volume of ascites. No pneumoperitoneum. No pathologic distention of small bowel. 10 mm short axis inguinal lymph nodes bilaterally are nonspecific. No other definite lymphadenopathy identified in the abdomen or pelvis on today's non contrast CT examination. Mild diffuse mesenteric edema. Prostate gland is enlarged measuring 4.9 x 6.1 cm. Urinary bladder is generally unremarkable in appearance, however, the attenuation values in the urine are rather high ranging from 45-55 HU, suggesting  proteinaceous or hemorrhagic contents.  Musculoskeletal: Mild diffuse body wall edema. There are no aggressive appearing lytic or blastic lesions noted in the visualized portions of the skeleton.  IMPRESSION: 1. No definite source for bacteremia identified on today's examination. 2. Relatively high attenuation of the urine is of uncertain etiology and significance, but may suggest some proteinaceous or hemorrhagic contents. 3. Findings suggestive of anasarca, including moderate to large bilateral pleural effusions, trace volume of ascites, mild diffuse mesenteric edema and diffuse body wall edema. 4. Prostatomegaly. 5. Additional incidental findings, as above.   Electronically Signed   By: Vinnie Langton M.D.   On: 07/09/2014 12:46   Dg Chest 2 View  07/15/2014   CLINICAL DATA:  Pacemaker extraction  EXAM: CHEST  2 VIEW  COMPARISON:  07/07/2014  FINDINGS: Cardiac pacer has been removed with generator and 2 leads absent. Mild interstitial change in peribronchial cuffing. These findings are less prominent when compared to the prior study. There is consolidation in the medial left lung base which appears most consistent with atelectasis.  IMPRESSION: 1. Mild residual interstitial change may reflect minimal remaining interstitial pulmonary edema 2. Left lower lobe atelectasis   Electronically Signed   By: Skipper Cliche M.D.   On: 07/15/2014 08:15   Dg Chest 2 View  07/28/2014   CLINICAL DATA:  Fever, chills, shortness of breath.  EXAM: CHEST  2 VIEW  COMPARISON:  06/10/2014  FINDINGS: Left pacer in place with leads in the right atrium and right ventricle. Heart is normal size. Mild peribronchial thickening. No confluent airspace opacities or effusions. No acute bony abnormality.  IMPRESSION: Bronchitic changes.   Electronically Signed   By: Rolm Baptise M.D.   On: 07/20/2014 15:07   Ct Head Wo Contrast  07/09/2014   CLINICAL DATA:  Hallucinations.  Thrombocytopenia  EXAM: CT HEAD WITHOUT CONTRAST  TECHNIQUE:  Contiguous axial images were obtained from the base of the skull through the vertex without intravenous contrast.  COMPARISON:  None.  FINDINGS: Mild atrophy. Negative for hemorrhage. Negative for acute infarct or mass. Calvarium intact.  IMPRESSION: No acute abnormality.   Electronically Signed   By: Franchot Gallo M.D.   On: 07/09/2014 18:47   Ct Angio Chest W/cm &/or Wo Cm  07/05/2014   CLINICAL DATA:  Shortness of breath and fever  EXAM: CT ANGIOGRAPHY CHEST WITH CONTRAST  TECHNIQUE: Multidetector CT imaging of the chest was performed using the standard protocol during bolus administration of intravenous contrast. Multiplanar CT image reconstructions and MIPs were obtained to evaluate the vascular anatomy.  CONTRAST:  62mL OMNIPAQUE IOHEXOL 350 MG/ML SOLN  COMPARISON:  Chest radiograph July 06, 2014  FINDINGS: There is no demonstrable pulmonary embolus. There is no thoracic aortic aneurysm or dissection.  There are free-flowing pleural effusions bilaterally. There is hazy ground-glass opacity diffusely.  On axial CT slice 55 series 6, there is a 6 x 4 mm nodular opacity in the medial segment of the right middle lobe. On axial slice 64 series 6, there is a 6  x 4 mm nodular opacity in the lateral segment of the left lower lobe. On axial slice 62 series 6, there is a 7 x 5 mm nodular opacity in the lateral segment of the right middle lobe. On axial slice 69 series 6, there is a 5 x 4 mm nodular opacity in the superior segment of the right lower lobe.  There is no appreciable thoracic adenopathy. Pericardium is not thickened. Pacemaker leads are attached to the right atrium and right ventricle. There is a focal hiatal hernia.  Visualized upper abdominal structures appear unremarkable. There are no blastic or lytic bone lesions. There is degenerative change in the thoracic spine. Thyroid appears unremarkable.  Review of the MIP images confirms the above findings.  IMPRESSION: No demonstrable pulmonary embolus.   Bilateral effusions with extensive ground-glass opacity bilaterally. Suspect alveolar edema with findings consistent with congestive heart failure. Atypical infection or allergic type phenomenon are differential considerations, however.  Several nodular opacities, largest measuring 7 mm. Followup of these nodular opacity should be based on Fleischner Society guidelines. If the patient is at high risk for bronchogenic carcinoma, follow-up chest CT at 3-34months is recommended. If the patient is at low risk for bronchogenic carcinoma, follow-up chest CT at 6-12 months is recommended. This recommendation follows the consensus statement: Guidelines for Management of Small Pulmonary Nodules Detected on CT Scans: A Statement from the [place] as published in Radiology 2005; 237:395-400.  Focal hiatal hernia.  No adenopathy.   Electronically Signed   By: Lowella Grip M.D.   On: 07/20/2014 18:09   Dg Chest Port 1 View  07/07/2014   CLINICAL DATA:  Evaluate airspace.  EXAM: PORTABLE CHEST - 1 VIEW  COMPARISON:  CT 07/25/2014.  Chest x-ray 07/08/2014 and 06/10/2014.  FINDINGS: Cardiac pacer with lead tips in right atrium and right ventricle. Mediastinum and hilar structures are unremarkable. Cardiomegaly with mild pulmonary vascular prominence and interstitial prominence consistent with congestive heart failure. Small pleural effusions. No pneumothorax. Reference is made to recent chest CT which demonstrated small pulmonary nodules. These are not definitely identified by chest x-ray. No acute bony abnormality.  IMPRESSION: 1. Persistent changes of congestive heart failure pulmonary edema. Small pleural effusions. 2. Reference is made to recent chest CT which demonstrated small pulmonary nodules. Follow-up is recommended as on prior CT report.   Electronically Signed   By: Marcello Moores  Register   On: 07/07/2014 07:26    CBC  Recent Labs Lab 07/19/14 0328 07/20/14 0300 07/21/14 0430 07/22/14 0445  07/23/14 0530  WBC 7.8 6.4 5.2 5.2 4.4  HGB 10.7* 10.4* 10.6* 10.5* 10.2*  HCT 34.0* 33.4* 33.1* 32.7* 31.3*  PLT 57* 59* 66* 64* 66*  MCV 105.6* 105.4* 106.4* 105.5* 105.4*  MCH 33.2 32.8 34.1* 33.9 34.3*  MCHC 31.5 31.1 32.0 32.1 32.6  RDW 23.8* 23.5* 23.2* 23.1* 22.5*    Chemistries   Recent Labs Lab 07/19/14 0328 07/20/14 0300 07/21/14 0430 07/22/14 0445 07/23/14 0530  NA 137 135* 138 140 140  K 4.1 4.0 4.0 4.2 4.0  CL 100 97 96 99 97  CO2 28 29 32 33* 32  GLUCOSE 111* 95 96 90 76  BUN 25* 27* 28* 34* 33*  CREATININE 1.53* 1.57* 1.55* 1.84* 1.70*  CALCIUM 8.4 8.3* 8.7 8.3* 8.6  AST 44* 43* 50* 45* 48*  ALT 37 34 37 32 31  ALKPHOS 69 64 66 61 63  BILITOT 0.6 0.5 0.6 0.4 0.4   ------------------------------------------------------------------------------------------------------------------ estimated creatinine clearance is 37.5  ml/min (by C-G formula based on Cr of 1.7). ------------------------------------------------------------------------------------------------------------------  Recent Labs  07/21/14 0430  HGBA1C 5.4   ------------------------------------------------------------------------------------------------------------------ No results found for this basename: CHOL, HDL, LDLCALC, TRIG, CHOLHDL, LDLDIRECT,  in the last 72 hours ------------------------------------------------------------------------------------------------------------------ No results found for this basename: TSH, T4TOTAL, FREET3, T3FREE, THYROIDAB,  in the last 72 hours ------------------------------------------------------------------------------------------------------------------ No results found for this basename: VITAMINB12, FOLATE, FERRITIN, TIBC, IRON, RETICCTPCT,  in the last 72 hours  Coagulation profile  Recent Labs Lab 07/16/14 2200 07/21/14 0430  INR 1.21 1.20    No results found for this basename: DDIMER,  in the last 72 hours  Cardiac Enzymes No results found  for this basename: CK, CKMB, TROPONINI, MYOGLOBIN,  in the last 168 hours ------------------------------------------------------------------------------------------------------------------ No components found with this basename: POCBNP,     Jessenya Berdan C D.O. on 07/23/2014 at 8:59 AM  Between 7am to 7pm - Pager - [phone]  After 7pm go to [email or web address] - password [email or web address]  And look for the night coverage person covering for me after hours  [facility] Office  [phone]

## 2014-07-23 NOTE — Progress Notes (Signed)
ANTIBIOTIC CONSULT NOTE - FOLLOW UP  Pharmacy Consult for Gentamicin Indication: endocarditis  No Known Allergies  Patient Measurements: Height: 5\' 11"  (180.3 cm) Weight: 190 lb 11.2 oz (86.5 kg) IBW/kg (Calculated) : 75.3  Vital Signs: Temp: 98 F (36.7 C) (08/20 0723) Temp src: Oral (08/20 0723) BP: 144/40 mmHg (08/20 0723) Pulse Rate: 80 (08/19 2317) Intake/Output from previous day: 08/19 0701 - 08/20 0700 In: 4235 [P.O.:1440; I.V.:40; IV Piggyback:300] Out: 2275 [Urine:2275] Intake/Output from this shift: Total I/O In: 240 [P.O.:240] Out: 200 [Urine:200]  Labs:  Recent Labs  07/21/14 0430 07/22/14 0445 07/23/14 0530  WBC 5.2 5.2 4.4  HGB 10.6* 10.5* 10.2*  PLT 66* 64* 66*  CREATININE 1.55* 1.84* 1.70*   Estimated Creatinine Clearance: 37.5 ml/min (by C-G formula based on Cr of 1.7).  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 SCr is slightly improved today. Last gent peak and trough were therapeutic.  LVQ 8/3 >>8/5 Vanc 8/3 >>8/8 Zosyn 8/3 >> 8/5 Ampicillin 8/8>>  Gentamicin 8/9>> Gent peak 8/12 @ 1430: 2.5 Gent trough 8/12 @ 1253: <0.5 Gent peak 8/16 @ 1415: 8.7 Gent trough 8/16 @ 1240: 0.6 Gent peak 8/17 @ 1525: 3.2 Gent trough 8/17 @ 1330: 0.7  8/3 Bld >> 2/2 Enterococcus (sens amp) 8/3 Urine >> 35K enterobacter (resis ancef) 8/5 Bld>> Neg  Goal of Therapy:  Gent peak 3-4 mcg/ml Gent trough < 1 mcg/ml  Plan:  1) Continue gentamicin 100mg  IV q24 2) Continue ampicillin 2g IV q6   Deboraha Sprang 07/23/2014,10:48 AM

## 2014-07-23 NOTE — Progress Notes (Signed)
CARDIAC REHAB PHASE I   PRE:  Rate/Rhythm: 79 first deg    BP: sitting 126/59    SaO2:   MODE:  Ambulation: 2100 ft   POST:  Rate/Rhythm: 86 first deg    BP: sitting 134/79     SaO2:   Tolerated very well. Wanted to walk 6 laps. No c/o, slight DOE. Set up pre-op video for pt and wife, reviewed mobility post-op. Pt ready for surgery. [id number]   Josephina Shih [place] CES, ACSM 07/23/2014 12:21 PM

## 2014-07-23 NOTE — Progress Notes (Signed)
SUBJECTIVE:  The patient denies CP or SOB. Sinus drainage.   Scheduled Meds: . [START ON 07/06/2014] aminocaproic acid (AMICAR) for OHS   Intravenous To OR  . amLODipine  2.5 mg Oral Daily  . ampicillin (OMNIPEN) IV  2 g Intravenous 4 times per day  . atorvastatin  10 mg Oral QPM  . bisacodyl  5 mg Oral Once  . [START ON 07/04/2014] cefUROXime (ZINACEF)  IV  1.5 g Intravenous To OR  . [START ON 07/06/2014] cefUROXime (ZINACEF)  IV  750 mg Intravenous To OR  . chlorhexidine  15 mL Mouth/Throat BID  . [START ON 07/08/2014] dexmedetomidine  0.1-0.7 mcg/kg/hr Intravenous To OR  . [START ON 08/03/2014] DOPamine  2-20 mcg/kg/min Intravenous To OR  . [START ON 07/23/2014] epinephrine  0.5-20 mcg/min Intravenous To OR  . feeding supplement (ENSURE COMPLETE)  237 mL Oral TID WC  . fluticasone  1 spray Each Nare Daily  . furosemide  60 mg Oral BID  . gentamicin  100 mg Intravenous Q24H  . [START ON 07/04/2014] heparin-papaverine-plasmalyte irrigation   Irrigation To OR  . [START ON 07/12/2014] heparin 30,000 units/NS 1000 mL solution for CELLSAVER   Other To OR  . insulin aspart  0-9 Units Subcutaneous TID WC  . [START ON 07/18/2014] insulin (NOVOLIN-R) infusion   Intravenous To OR  . [START ON 07/10/2014] magnesium sulfate  40 mEq Other To OR  . [START ON 07/18/2014] metoprolol tartrate  12.5 mg Oral Once  . [START ON 07/23/2014] nitroGLYCERIN  2-200 mcg/min Intravenous To OR  . pantoprazole  40 mg Oral Daily  . [START ON 07/28/2014] phenylephrine (NEO-SYNEPHRINE) Adult infusion  30-200 mcg/min Intravenous To OR  . [START ON 07/14/2014] potassium chloride  80 mEq Other To OR  . predniSONE  10 mg Oral Q breakfast  . pyridOXINE  100 mg Oral QPM  . sodium chloride  10-40 mL Intracatheter Q12H  . tamsulosin  0.4 mg Oral QPC supper  . [START ON 07/29/2014] vancomycin  1,250 mg Intravenous To OR  . vitamin E  400 Units Oral Daily   OBJECTIVE:    Vitals:   Filed Vitals:   07/23/14 0315 07/23/14 0500  07/23/14 0722 07/23/14 0723  BP: 166/49  144/40 144/40  Pulse:      Temp:  97.7 F (36.5 C)  98 F (36.7 C)  TempSrc:  Oral  Oral  Resp: 18   17  Height:      Weight:  190 lb 11.2 oz (86.5 kg)    SpO2: 98%   94%   I&O's:    Intake/Output Summary (Last 24 hours) at 07/23/14 1149 Last data filed at 07/23/14 1100  Gross per 24 hour  Intake   1230 ml  Output   2575 ml  Net  -1345 ml   TELEMETRY: Reviewed telemetry pt in NSR:  PHYSICAL EXAM General: Well developed, well nourished, in no acute distress Head: Eyes PERRLA, No xanthomas.   Normal cephalic and atramatic  Lungs:   Crackles at the bases. Heart:   HRRR S1 S2 Pulses are 2+ & equal. 2/6 SM at RUSB to LLSB Abdomen: Bowel sounds are positive, abdomen soft and non-tender without masses  Extremities:   1+ pitting edema around ankles Neuro: Alert and oriented X 3. Psych:  Good affect, responds appropriately  LABS: Basic Metabolic Panel:  Recent Labs  07/22/14 0445 07/23/14 0530  NA 140 140  K 4.2 4.0  CL 99 97  CO2 33* 32  GLUCOSE 90 76  BUN 34* 33*  CREATININE 1.84* 1.70*  CALCIUM 8.3* 8.6   Liver Function Tests:  Recent Labs  07/22/14 0445 07/23/14 0530  AST 45* 48*  ALT 32 31  ALKPHOS 61 63  BILITOT 0.4 0.4  PROT 6.4 6.4  ALBUMIN 2.2* 2.3*   CBC:  Recent Labs  07/22/14 0445 07/23/14 0530  WBC 5.2 4.4  HGB 10.5* 10.2*  HCT 32.7* 31.3*  MCV 105.5* 105.4*  PLT 64* 66*   Coag Panel:   Lab Results  Component Value Date   INR 1.20 07/21/2014   INR 1.21 07/16/2014   INR 1.48 07/10/2014   Telemetry: A-fib, rate controlled    Assessment/Plan:   1. Subacute bacterial endocarditis-enterococcus on initial blood cultures with most recent cultures negative. TEE - aortic valve vegetation with moderate AI and severe MR. AVR and either MVR or mitral valve repair tomorrow. His pacemaker has been removed. Cardiac catheterization showed nonobstructive distal LM, widely patent LAD and stent in mid vessel,  severely disease small to moderate sized diagonal, heavily calcified ostial circ with 70% stenosis, 50-70% stenosis of a small OM2, widely patent RCA with moderate irregularities.   2. Paroxysmal atrial fibrillation with post-termination pauses -anticoagulation is on hold given thrombocytopenia. Pacemaker has been removed. Telemetry has shown some brief episodes of paroxysmal atrial fibrillation. Will require Pacemaker replacement once he recovers from valve surgery.   3. Stage III CKD - follow renal function closely given use of gentamicin. Creatinine stable   4. 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 Plt count up to 66K on steroids  5. Hypertension-given aortic insufficiency and mitral regurgitation need to have blood pressure well controlled. We will avoid beta blockade given history of bradycardia. Avoid ACE inhibitors given renal insufficiency. Increase amlodipine to 5 mg po daily  6. Chronic diastolic heart failure - significant improvement of volume status, -5 Liters in the last 2 days, significant improvement in LE swelling, We will hold Lasix tonight. His heart failure is most likely from his mitral regurgitation and aortic insufficiency. Crea is stable.   Justin Spark, MD  07/23/2014  11:49 AM

## 2014-07-23 NOTE — Progress Notes (Signed)
Justin [name] looks good this morning. Hopefully, he will go to surgery tomorrow. I think there is some concern regarding his kidney function. His labs today are not back.  He has had no fever. He's walking a lot more. He is doing very well with physical therapy.  There is no bleeding.  His platelet count was stable yesterday. His prednisone dose is down to 10 mg. We will see about cutting back some more.  He has had no problems with nausea or vomiting.  He is getting his antibiotics through a PICC line.  His vital signs are all stable. Blood pressure is 166/49. Heart rate is 76. He is afebrile. Lungs are clear. Cardiac exam regular rate and rhythm. There is a 1/6 systolic murmur. Abdomen is soft. There is no fluid wave. There is no palpable liver or spleen tip. Extremities shows no clubbing, cyanosis or edema. Skin exam shows some scattered small ecchymoses.  We will see what his platelet count is. If he does go to surgery tomorrow, I would have to think that he will get platelets before the procedure. He received his dose of Nplate on Monday.  His hemoglobin has been holding on pretty nicely. Again, I think that with the improvement in the endocarditis, that is helping his hemoglobin.  We will continue to follow along.  Pete E.  2 Thessalonians 3:3

## 2014-07-24 ENCOUNTER — Inpatient Hospital Stay (HOSPITAL_COMMUNITY): Payer: Medicare Other

## 2014-07-24 ENCOUNTER — Encounter (HOSPITAL_COMMUNITY): Payer: Medicare Other | Admitting: Anesthesiology

## 2014-07-24 ENCOUNTER — Encounter (HOSPITAL_COMMUNITY)
Admission: EM | Disposition: E | Payer: Medicare Other | Source: Home / Self Care | Attending: Thoracic Surgery (Cardiothoracic Vascular Surgery)

## 2014-07-24 ENCOUNTER — Inpatient Hospital Stay (HOSPITAL_COMMUNITY): Payer: Medicare Other | Admitting: Anesthesiology

## 2014-07-24 ENCOUNTER — Encounter (HOSPITAL_COMMUNITY): Payer: Self-pay | Admitting: Critical Care Medicine

## 2014-07-24 DIAGNOSIS — I059 Rheumatic mitral valve disease, unspecified: Secondary | ICD-10-CM

## 2014-07-24 DIAGNOSIS — I359 Nonrheumatic aortic valve disorder, unspecified: Secondary | ICD-10-CM

## 2014-07-24 DIAGNOSIS — Z952 Presence of prosthetic heart valve: Secondary | ICD-10-CM

## 2014-07-24 DIAGNOSIS — I2510 Atherosclerotic heart disease of native coronary artery without angina pectoris: Secondary | ICD-10-CM

## 2014-07-24 DIAGNOSIS — I4891 Unspecified atrial fibrillation: Secondary | ICD-10-CM

## 2014-07-24 DIAGNOSIS — Z9889 Other specified postprocedural states: Secondary | ICD-10-CM

## 2014-07-24 DIAGNOSIS — Z951 Presence of aortocoronary bypass graft: Secondary | ICD-10-CM

## 2014-07-24 DIAGNOSIS — Z953 Presence of xenogenic heart valve: Secondary | ICD-10-CM

## 2014-07-24 DIAGNOSIS — Z954 Presence of other heart-valve replacement: Secondary | ICD-10-CM

## 2014-07-24 DIAGNOSIS — Z8679 Personal history of other diseases of the circulatory system: Secondary | ICD-10-CM

## 2014-07-24 HISTORY — PX: MAZE: SHX5063

## 2014-07-24 HISTORY — DX: Presence of other heart-valve replacement: Z95.4

## 2014-07-24 HISTORY — PX: CORONARY ARTERY BYPASS GRAFT: SHX141

## 2014-07-24 HISTORY — DX: Presence of prosthetic heart valve: Z95.2

## 2014-07-24 HISTORY — DX: Presence of aortocoronary bypass graft: Z95.1

## 2014-07-24 HISTORY — PX: INTRAOPERATIVE TRANSESOPHAGEAL ECHOCARDIOGRAM: SHX5062

## 2014-07-24 HISTORY — DX: Presence of xenogenic heart valve: Z95.3

## 2014-07-24 HISTORY — PX: MITRAL VALVE REPLACEMENT: SHX147

## 2014-07-24 HISTORY — DX: Personal history of other diseases of the circulatory system: Z86.79

## 2014-07-24 HISTORY — PX: AORTIC VALVE REPLACEMENT: SHX41

## 2014-07-24 LAB — POCT I-STAT 3, ART BLOOD GAS (G3+)
ACID-BASE EXCESS: 8 mmol/L — AB (ref 0.0–2.0)
ACID-BASE EXCESS: 3 mmol/L — AB (ref 0.0–2.0)
ACID-BASE EXCESS: 1 mmol/L (ref 0.0–2.0)
ACID-BASE EXCESS: 3 mmol/L — AB (ref 0.0–2.0)
ACID-BASE EXCESS: 4 mmol/L — AB (ref 0.0–2.0)
BICARBONATE: 32.9 meq/L — AB (ref 20.0–24.0)
BICARBONATE: 27.9 meq/L — AB (ref 20.0–24.0)
BICARBONATE: 27.3 meq/L — AB (ref 20.0–24.0)
BICARBONATE: 25.5 meq/L — AB (ref 20.0–24.0)
Bicarbonate: 27.6 mEq/L — ABNORMAL HIGH (ref 20.0–24.0)
Bicarbonate: 29.5 mEq/L — ABNORMAL HIGH (ref 20.0–24.0)
O2 SAT: 99 %
O2 SAT: 100 %
O2 SAT: 99 %
O2 SAT: 90 %
O2 Saturation: 100 %
O2 Saturation: 91 %
PO2 ART: 153 mmHg — AB (ref 80.0–100.0)
PO2 ART: 66 mmHg — AB (ref 80.0–100.0)
PO2 ART: 124 mmHg — AB (ref 80.0–100.0)
PO2 ART: 345 mmHg — AB (ref 80.0–100.0)
Patient temperature: 35.5
TCO2: 34 mmol/L (ref 0–100)
TCO2: 29 mmol/L (ref 0–100)
TCO2: 29 mmol/L (ref 0–100)
TCO2: 29 mmol/L (ref 0–100)
TCO2: 31 mmol/L (ref 0–100)
TCO2: 27 mmol/L (ref 0–100)
pCO2 arterial: 45.6 mmHg — ABNORMAL HIGH (ref 35.0–45.0)
pCO2 arterial: 45.5 mmHg — ABNORMAL HIGH (ref 35.0–45.0)
pCO2 arterial: 50 mmHg — ABNORMAL HIGH (ref 35.0–45.0)
pCO2 arterial: 41.6 mmHg (ref 35.0–45.0)
pCO2 arterial: 43.7 mmHg (ref 35.0–45.0)
pCO2 arterial: 41 mmHg (ref 35.0–45.0)
pH, Arterial: 7.466 — ABNORMAL HIGH (ref 7.350–7.450)
pH, Arterial: 7.396 (ref 7.350–7.450)
pH, Arterial: 7.346 — ABNORMAL LOW (ref 7.350–7.450)
pH, Arterial: 7.43 (ref 7.350–7.450)
pH, Arterial: 7.432 (ref 7.350–7.450)
pH, Arterial: 7.396 (ref 7.350–7.450)
pO2, Arterial: 372 mmHg — ABNORMAL HIGH (ref 80.0–100.0)
pO2, Arterial: 54 mmHg — ABNORMAL LOW (ref 80.0–100.0)

## 2014-07-24 LAB — POCT I-STAT, CHEM 8
BUN: 24 mg/dL — AB (ref 6–23)
BUN: 23 mg/dL (ref 6–23)
BUN: 25 mg/dL — AB (ref 6–23)
BUN: 26 mg/dL — ABNORMAL HIGH (ref 6–23)
BUN: 26 mg/dL — AB (ref 6–23)
BUN: 25 mg/dL — AB (ref 6–23)
BUN: 23 mg/dL (ref 6–23)
BUN: 23 mg/dL (ref 6–23)
BUN: 23 mg/dL (ref 6–23)
BUN: 23 mg/dL (ref 6–23)
CALCIUM ION: 1.15 mmol/L (ref 1.13–1.30)
CALCIUM ION: 0.9 mmol/L — AB (ref 1.13–1.30)
CALCIUM ION: 1.37 mmol/L — AB (ref 1.13–1.30)
CALCIUM ION: 0.8 mmol/L — AB (ref 1.13–1.30)
CHLORIDE: 94 meq/L — AB (ref 96–112)
CHLORIDE: 96 meq/L (ref 96–112)
CHLORIDE: 97 meq/L (ref 96–112)
CHLORIDE: 98 meq/L (ref 96–112)
CHLORIDE: 98 meq/L (ref 96–112)
CHLORIDE: 100 meq/L (ref 96–112)
CHLORIDE: 97 meq/L (ref 96–112)
CREATININE: 1.5 mg/dL — AB (ref 0.50–1.35)
CREATININE: 1.3 mg/dL (ref 0.50–1.35)
CREATININE: 1.5 mg/dL — AB (ref 0.50–1.35)
CREATININE: 1.4 mg/dL — AB (ref 0.50–1.35)
CREATININE: 1.4 mg/dL — AB (ref 0.50–1.35)
CREATININE: 1.4 mg/dL — AB (ref 0.50–1.35)
CREATININE: 1.3 mg/dL (ref 0.50–1.35)
Calcium, Ion: 1.1 mmol/L — ABNORMAL LOW (ref 1.13–1.30)
Calcium, Ion: 1.01 mmol/L — ABNORMAL LOW (ref 1.13–1.30)
Calcium, Ion: 1 mmol/L — ABNORMAL LOW (ref 1.13–1.30)
Calcium, Ion: 0.95 mmol/L — ABNORMAL LOW (ref 1.13–1.30)
Calcium, Ion: 0.99 mmol/L — ABNORMAL LOW (ref 1.13–1.30)
Calcium, Ion: 1.01 mmol/L — ABNORMAL LOW (ref 1.13–1.30)
Chloride: 96 mEq/L (ref 96–112)
Chloride: 97 mEq/L (ref 96–112)
Chloride: 100 mEq/L (ref 96–112)
Creatinine, Ser: 1.3 mg/dL (ref 0.50–1.35)
Creatinine, Ser: 1.3 mg/dL (ref 0.50–1.35)
Creatinine, Ser: 1.4 mg/dL — ABNORMAL HIGH (ref 0.50–1.35)
GLUCOSE: 94 mg/dL (ref 70–99)
GLUCOSE: 90 mg/dL (ref 70–99)
GLUCOSE: 132 mg/dL — AB (ref 70–99)
GLUCOSE: 106 mg/dL — AB (ref 70–99)
GLUCOSE: 134 mg/dL — AB (ref 70–99)
GLUCOSE: 140 mg/dL — AB (ref 70–99)
Glucose, Bld: 107 mg/dL — ABNORMAL HIGH (ref 70–99)
Glucose, Bld: 122 mg/dL — ABNORMAL HIGH (ref 70–99)
Glucose, Bld: 137 mg/dL — ABNORMAL HIGH (ref 70–99)
Glucose, Bld: 130 mg/dL — ABNORMAL HIGH (ref 70–99)
HCT: 29 % — ABNORMAL LOW (ref 39.0–52.0)
HCT: 24 % — ABNORMAL LOW (ref 39.0–52.0)
HCT: 25 % — ABNORMAL LOW (ref 39.0–52.0)
HCT: 25 % — ABNORMAL LOW (ref 39.0–52.0)
HCT: 25 % — ABNORMAL LOW (ref 39.0–52.0)
HCT: 22 % — ABNORMAL LOW (ref 39.0–52.0)
HCT: 25 % — ABNORMAL LOW (ref 39.0–52.0)
HEMATOCRIT: 22 % — AB (ref 39.0–52.0)
HEMATOCRIT: 28 % — AB (ref 39.0–52.0)
HEMATOCRIT: 26 % — AB (ref 39.0–52.0)
HEMOGLOBIN: 8.2 g/dL — AB (ref 13.0–17.0)
HEMOGLOBIN: 9.5 g/dL — AB (ref 13.0–17.0)
Hemoglobin: 9.9 g/dL — ABNORMAL LOW (ref 13.0–17.0)
Hemoglobin: 8.5 g/dL — ABNORMAL LOW (ref 13.0–17.0)
Hemoglobin: 8.5 g/dL — ABNORMAL LOW (ref 13.0–17.0)
Hemoglobin: 8.5 g/dL — ABNORMAL LOW (ref 13.0–17.0)
Hemoglobin: 7.5 g/dL — ABNORMAL LOW (ref 13.0–17.0)
Hemoglobin: 8.5 g/dL — ABNORMAL LOW (ref 13.0–17.0)
Hemoglobin: 7.5 g/dL — ABNORMAL LOW (ref 13.0–17.0)
Hemoglobin: 8.8 g/dL — ABNORMAL LOW (ref 13.0–17.0)
POTASSIUM: 4.5 meq/L (ref 3.7–5.3)
POTASSIUM: 4.4 meq/L (ref 3.7–5.3)
POTASSIUM: 4.3 meq/L (ref 3.7–5.3)
POTASSIUM: 4.5 meq/L (ref 3.7–5.3)
POTASSIUM: 4.9 meq/L (ref 3.7–5.3)
POTASSIUM: 4.6 meq/L (ref 3.7–5.3)
Potassium: 3.7 mEq/L (ref 3.7–5.3)
Potassium: 4 mEq/L (ref 3.7–5.3)
Potassium: 4.4 mEq/L (ref 3.7–5.3)
Potassium: 4.1 mEq/L (ref 3.7–5.3)
SODIUM: 133 meq/L — AB (ref 137–147)
SODIUM: 136 meq/L — AB (ref 137–147)
Sodium: 134 mEq/L — ABNORMAL LOW (ref 137–147)
Sodium: 132 mEq/L — ABNORMAL LOW (ref 137–147)
Sodium: 132 mEq/L — ABNORMAL LOW (ref 137–147)
Sodium: 131 mEq/L — ABNORMAL LOW (ref 137–147)
Sodium: 131 mEq/L — ABNORMAL LOW (ref 137–147)
Sodium: 133 mEq/L — ABNORMAL LOW (ref 137–147)
Sodium: 135 mEq/L — ABNORMAL LOW (ref 137–147)
Sodium: 136 mEq/L — ABNORMAL LOW (ref 137–147)
TCO2: 30 mmol/L (ref 0–100)
TCO2: 31 mmol/L (ref 0–100)
TCO2: 33 mmol/L (ref 0–100)
TCO2: 31 mmol/L (ref 0–100)
TCO2: 31 mmol/L (ref 0–100)
TCO2: 27 mmol/L (ref 0–100)
TCO2: 25 mmol/L (ref 0–100)
TCO2: 26 mmol/L (ref 0–100)
TCO2: 27 mmol/L (ref 0–100)
TCO2: 25 mmol/L (ref 0–100)

## 2014-07-24 LAB — BASIC METABOLIC PANEL
ANION GAP: 9 (ref 5–15)
BUN: 30 mg/dL — ABNORMAL HIGH (ref 6–23)
CHLORIDE: 94 meq/L — AB (ref 96–112)
CO2: 31 meq/L (ref 19–32)
Calcium: 8.4 mg/dL (ref 8.4–10.5)
Creatinine, Ser: 1.68 mg/dL — ABNORMAL HIGH (ref 0.50–1.35)
GFR calc Af Amer: 43 mL/min — ABNORMAL LOW (ref >90)
GFR calc non Af Amer: 37 mL/min — ABNORMAL LOW (ref >90)
GLUCOSE: 84 mg/dL (ref 70–99)
Potassium: 4.1 mEq/L (ref 3.7–5.3)
SODIUM: 134 meq/L — AB (ref 137–147)

## 2014-07-24 LAB — DIC (DISSEMINATED INTRAVASCULAR COAGULATION) PANEL
APTT: 49 s — AB (ref 24–37)
D DIMER QUANT: 1.26 ug{FEU}/mL — AB (ref 0.00–0.48)
FIBRINOGEN: 205 mg/dL (ref 204–475)
INR: 1.63 — ABNORMAL HIGH (ref 0.00–1.49)
PROTHROMBIN TIME: 19.3 s — AB (ref 11.6–15.2)

## 2014-07-24 LAB — CBC
HCT: 31.5 % — ABNORMAL LOW (ref 39.0–52.0)
HEMATOCRIT: 28.3 % — AB (ref 39.0–52.0)
HEMATOCRIT: 21.6 % — AB (ref 39.0–52.0)
HEMATOCRIT: 29.8 % — AB (ref 39.0–52.0)
HEMATOCRIT: 26.8 % — AB (ref 39.0–52.0)
HEMOGLOBIN: 10.3 g/dL — AB (ref 13.0–17.0)
HEMOGLOBIN: 10.1 g/dL — AB (ref 13.0–17.0)
Hemoglobin: 9 g/dL — ABNORMAL LOW (ref 13.0–17.0)
Hemoglobin: 7.1 g/dL — ABNORMAL LOW (ref 13.0–17.0)
Hemoglobin: 9.2 g/dL — ABNORMAL LOW (ref 13.0–17.0)
MCH: 34.1 pg — AB (ref 26.0–34.0)
MCH: 32.3 pg (ref 26.0–34.0)
MCH: 32 pg (ref 26.0–34.0)
MCH: 32.3 pg (ref 26.0–34.0)
MCH: 31.9 pg (ref 26.0–34.0)
MCHC: 32.7 g/dL (ref 30.0–36.0)
MCHC: 31.8 g/dL (ref 30.0–36.0)
MCHC: 32.9 g/dL (ref 30.0–36.0)
MCHC: 33.9 g/dL (ref 30.0–36.0)
MCHC: 34.3 g/dL (ref 30.0–36.0)
MCV: 104.3 fL — AB (ref 78.0–100.0)
MCV: 101.4 fL — AB (ref 78.0–100.0)
MCV: 97.3 fL (ref 78.0–100.0)
MCV: 95.2 fL (ref 78.0–100.0)
MCV: 93.1 fL (ref 78.0–100.0)
PLATELETS: 77 10*3/uL — AB (ref 150–400)
Platelets: 88 10*3/uL — ABNORMAL LOW (ref 150–400)
Platelets: 157 10*3/uL (ref 150–400)
Platelets: 89 10*3/uL — ABNORMAL LOW (ref 150–400)
Platelets: 85 10*3/uL — ABNORMAL LOW (ref 150–400)
RBC: 3.02 MIL/uL — AB (ref 4.22–5.81)
RBC: 2.79 MIL/uL — ABNORMAL LOW (ref 4.22–5.81)
RBC: 2.22 MIL/uL — ABNORMAL LOW (ref 4.22–5.81)
RBC: 3.13 MIL/uL — AB (ref 4.22–5.81)
RBC: 2.88 MIL/uL — AB (ref 4.22–5.81)
RDW: 21.7 % — ABNORMAL HIGH (ref 11.5–15.5)
RDW: 23.6 % — ABNORMAL HIGH (ref 11.5–15.5)
RDW: 22.5 % — AB (ref 11.5–15.5)
RDW: 20.9 % — AB (ref 11.5–15.5)
RDW: 21.3 % — ABNORMAL HIGH (ref 11.5–15.5)
WBC: 4 10*3/uL (ref 4.0–10.5)
WBC: 7.3 10*3/uL (ref 4.0–10.5)
WBC: 5.1 10*3/uL (ref 4.0–10.5)
WBC: 5.4 10*3/uL (ref 4.0–10.5)
WBC: 6.1 10*3/uL (ref 4.0–10.5)

## 2014-07-24 LAB — FIBRINOGEN
FIBRINOGEN: 188 mg/dL — AB (ref 204–475)
Fibrinogen: 128 mg/dL — ABNORMAL LOW (ref 204–475)
Fibrinogen: 199 mg/dL — ABNORMAL LOW (ref 204–475)
Fibrinogen: 273 mg/dL (ref 204–475)

## 2014-07-24 LAB — POCT I-STAT GLUCOSE
Glucose, Bld: 136 mg/dL — ABNORMAL HIGH (ref 70–99)
Glucose, Bld: 139 mg/dL — ABNORMAL HIGH (ref 70–99)
Glucose, Bld: 117 mg/dL — ABNORMAL HIGH (ref 70–99)
OPERATOR ID: 3406
Operator id: 3406
Operator id: 3406

## 2014-07-24 LAB — POCT I-STAT 4, (NA,K, GLUC, HGB,HCT)
GLUCOSE: 96 mg/dL (ref 70–99)
HCT: 26 % — ABNORMAL LOW (ref 39.0–52.0)
HEMOGLOBIN: 8.8 g/dL — AB (ref 13.0–17.0)
Potassium: 4.3 mEq/L (ref 3.7–5.3)
Sodium: 137 mEq/L (ref 137–147)

## 2014-07-24 LAB — DIC (DISSEMINATED INTRAVASCULAR COAGULATION)PANEL
Platelets: 77 10*3/uL — ABNORMAL LOW (ref 150–400)
Smear Review: NONE SEEN

## 2014-07-24 LAB — PREPARE RBC (CROSSMATCH)

## 2014-07-24 LAB — GRAM STAIN

## 2014-07-24 LAB — PLATELET COUNT: Platelets: 43 10*3/uL — ABNORMAL LOW (ref 150–400)

## 2014-07-24 LAB — PROTIME-INR
INR: 0.87 (ref 0.00–1.49)
INR: 1.36 (ref 0.00–1.49)
INR: 1.42 (ref 0.00–1.49)
PROTHROMBIN TIME: 16.8 s — AB (ref 11.6–15.2)
Prothrombin Time: 11.8 seconds (ref 11.6–15.2)
Prothrombin Time: 17.4 seconds — ABNORMAL HIGH (ref 11.6–15.2)

## 2014-07-24 LAB — APTT
APTT: 46 s — AB (ref 24–37)
aPTT: 49 seconds — ABNORMAL HIGH (ref 24–37)
aPTT: 45 seconds — ABNORMAL HIGH (ref 24–37)

## 2014-07-24 LAB — LACTATE DEHYDROGENASE: LDH: 485 U/L — ABNORMAL HIGH (ref 94–250)

## 2014-07-24 LAB — HEMOGLOBIN AND HEMATOCRIT, BLOOD
HEMATOCRIT: 25.7 % — AB (ref 39.0–52.0)
Hemoglobin: 8.6 g/dL — ABNORMAL LOW (ref 13.0–17.0)

## 2014-07-24 SURGERY — REPLACEMENT, AORTIC VALVE, OPEN
Anesthesia: General | Site: Chest

## 2014-07-24 MED ORDER — FENTANYL CITRATE 0.05 MG/ML IJ SOLN
INTRAMUSCULAR | Status: DC | PRN
  Administered 2014-07-24: 200 ug via INTRAVENOUS
  Administered 2014-07-24: 250 ug via INTRAVENOUS
  Administered 2014-07-24: 50 ug via INTRAVENOUS
  Administered 2014-07-24 (×3): 250 ug via INTRAVENOUS

## 2014-07-24 MED ORDER — MIDAZOLAM HCL 2 MG/2ML IJ SOLN
2.0000 mg | INTRAMUSCULAR | Status: DC | PRN
  Administered 2014-07-25 (×2): 2 mg via INTRAVENOUS
  Filled 2014-07-24 (×2): qty 2

## 2014-07-24 MED ORDER — SODIUM CHLORIDE 0.9 % IV SOLN: Freq: Once | INTRAVENOUS | Status: DC

## 2014-07-24 MED ORDER — CHLORHEXIDINE GLUCONATE 0.12 % MT SOLN
15.0000 mL | Freq: Two times a day (BID) | OROMUCOSAL | Status: DC
  Administered 2014-07-25 – 2014-08-09 (×27): 15 mL via OROMUCOSAL
  Filled 2014-07-24 (×32): qty 15

## 2014-07-24 MED ORDER — ACETAMINOPHEN 650 MG RE SUPP
650.0000 mg | Freq: Once | RECTAL | Status: AC
  Administered 2014-07-24: 650 mg via RECTAL

## 2014-07-24 MED ORDER — POTASSIUM CHLORIDE 10 MEQ/50ML IV SOLN: 10.0000 meq | INTRAVENOUS | Status: AC

## 2014-07-24 MED ORDER — LIDOCAINE HCL (CARDIAC) 20 MG/ML IV SOLN
INTRAVENOUS | Status: AC
  Filled 2014-07-24: qty 5

## 2014-07-24 MED ORDER — PROTAMINE SULFATE 10 MG/ML IV SOLN
INTRAVENOUS | Status: AC
  Filled 2014-07-24: qty 25

## 2014-07-24 MED ORDER — PREDNISONE 5 MG PO TABS: 5.0000 mg | ORAL_TABLET | Freq: Every day | ORAL | Status: DC

## 2014-07-24 MED ORDER — ACETAMINOPHEN 500 MG PO TABS
1000.0000 mg | ORAL_TABLET | Freq: Four times a day (QID) | ORAL | Status: AC
  Administered 2014-07-25 – 2014-07-29 (×16): 1000 mg via ORAL
  Filled 2014-07-24 (×18): qty 2

## 2014-07-24 MED ORDER — ALBUMIN HUMAN 5 % IV SOLN
INTRAVENOUS | Status: DC | PRN
  Administered 2014-07-24 (×2): via INTRAVENOUS

## 2014-07-24 MED ORDER — COAGULATION FACTOR VIIA RECOMB 1 MG IV SOLR
45.0000 ug/kg | Freq: Once | INTRAVENOUS | Status: AC
  Administered 2014-07-24: 4000 ug via INTRAVENOUS
  Filled 2014-07-24: qty 4

## 2014-07-24 MED ORDER — ASPIRIN 81 MG PO CHEW
324.0000 mg | CHEWABLE_TABLET | Freq: Every day | ORAL | Status: DC
  Administered 2014-07-25: 324 mg
  Filled 2014-07-24: qty 4

## 2014-07-24 MED ORDER — SODIUM CHLORIDE 0.9 % IV SOLN: 250.0000 mL | INTRAVENOUS | Status: DC

## 2014-07-24 MED ORDER — FAMOTIDINE IN NACL 20-0.9 MG/50ML-% IV SOLN
20.0000 mg | Freq: Two times a day (BID) | INTRAVENOUS | Status: AC
  Administered 2014-07-24 – 2014-07-25 (×2): 20 mg via INTRAVENOUS
  Filled 2014-07-24: qty 50

## 2014-07-24 MED ORDER — CETYLPYRIDINIUM CHLORIDE 0.05 % MT LIQD
7.0000 mL | Freq: Four times a day (QID) | OROMUCOSAL | Status: DC
  Administered 2014-07-25 – 2014-08-09 (×46): 7 mL via OROMUCOSAL

## 2014-07-24 MED ORDER — MORPHINE SULFATE 2 MG/ML IJ SOLN
2.0000 mg | INTRAMUSCULAR | Status: DC | PRN
  Administered 2014-07-25 (×2): 2 mg via INTRAVENOUS
  Administered 2014-07-25: 4 mg via INTRAVENOUS
  Administered 2014-07-25: 2 mg via INTRAVENOUS
  Administered 2014-07-26: 4 mg via INTRAVENOUS
  Filled 2014-07-24: qty 2
  Filled 2014-07-24: qty 1
  Filled 2014-07-24: qty 2
  Filled 2014-07-24: qty 1
  Filled 2014-07-24 (×2): qty 2

## 2014-07-24 MED ORDER — ARTIFICIAL TEARS OP OINT
TOPICAL_OINTMENT | OPHTHALMIC | Status: DC | PRN
  Administered 2014-07-24: 1 via OPHTHALMIC

## 2014-07-24 MED ORDER — MILRINONE IN DEXTROSE 20 MG/100ML IV SOLN
INTRAVENOUS | Status: DC | PRN
  Administered 2014-07-24: .3 ug/kg/min via INTRAVENOUS

## 2014-07-24 MED ORDER — PROTAMINE SULFATE 10 MG/ML IV SOLN
INTRAVENOUS | Status: DC | PRN
  Administered 2014-07-24: 240 mg via INTRAVENOUS

## 2014-07-24 MED ORDER — SODIUM CHLORIDE 0.9 % IJ SOLN
3.0000 mL | INTRAMUSCULAR | Status: DC | PRN
Start: 2014-07-25 — End: 2014-08-04
  Administered 2014-07-30 (×4): 3 mL via INTRAVENOUS

## 2014-07-24 MED ORDER — PANTOPRAZOLE SODIUM 40 MG PO TBEC
40.0000 mg | DELAYED_RELEASE_TABLET | Freq: Every day | ORAL | Status: DC
  Administered 2014-07-26: 40 mg via ORAL
  Filled 2014-07-24: qty 1

## 2014-07-24 MED ORDER — METOPROLOL TARTRATE 1 MG/ML IV SOLN: 2.5000 mg | INTRAVENOUS | Status: DC | PRN

## 2014-07-24 MED ORDER — ROCURONIUM BROMIDE 50 MG/5ML IV SOLN
INTRAVENOUS | Status: AC
  Filled 2014-07-24: qty 2

## 2014-07-24 MED ORDER — CALCIUM CHLORIDE 10 % IV SOLN
INTRAVENOUS | Status: AC
  Filled 2014-07-24: qty 20

## 2014-07-24 MED ORDER — LACTATED RINGERS IV SOLN
INTRAVENOUS | Status: DC | PRN
Start: 2014-07-24 — End: 2014-07-24
  Administered 2014-07-24 (×2): via INTRAVENOUS

## 2014-07-24 MED ORDER — ROCURONIUM BROMIDE 50 MG/5ML IV SOLN
INTRAVENOUS | Status: AC
  Filled 2014-07-24: qty 1

## 2014-07-24 MED ORDER — METOPROLOL TARTRATE 25 MG/10 ML ORAL SUSPENSION
12.5000 mg | Freq: Two times a day (BID) | ORAL | Status: DC
  Filled 2014-07-24 (×3): qty 5

## 2014-07-24 MED ORDER — MILRINONE IN DEXTROSE 20 MG/100ML IV SOLN
0.1250 ug/kg/min | INTRAVENOUS | Status: DC
  Filled 2014-07-24: qty 100

## 2014-07-24 MED ORDER — CALCIUM CHLORIDE 10 % IV SOLN
INTRAVENOUS | Status: DC | PRN
  Administered 2014-07-24 (×2): 200 mg via INTRAVENOUS
  Administered 2014-07-24: 300 mg via INTRAVENOUS
  Administered 2014-07-24: 250 mg via INTRAVENOUS
  Administered 2014-07-24 (×2): 200 mg via INTRAVENOUS
  Administered 2014-07-24: 250 mg via INTRAVENOUS

## 2014-07-24 MED ORDER — FENTANYL CITRATE 0.05 MG/ML IJ SOLN
INTRAMUSCULAR | Status: AC
  Filled 2014-07-24: qty 5

## 2014-07-24 MED ORDER — ROCURONIUM BROMIDE 100 MG/10ML IV SOLN
INTRAVENOUS | Status: DC | PRN
  Administered 2014-07-24 (×8): 50 mg via INTRAVENOUS

## 2014-07-24 MED ORDER — DEXMEDETOMIDINE HCL IN NACL 200 MCG/50ML IV SOLN
0.1000 ug/kg/h | INTRAVENOUS | Status: DC
  Administered 2014-07-24 – 2014-07-25 (×3): 0.7 ug/kg/h via INTRAVENOUS
  Filled 2014-07-24: qty 100
  Filled 2014-07-24: qty 50

## 2014-07-24 MED ORDER — ARTIFICIAL TEARS OP OINT
TOPICAL_OINTMENT | OPHTHALMIC | Status: AC
  Filled 2014-07-24: qty 3.5

## 2014-07-24 MED ORDER — HEPARIN SODIUM (PORCINE) 1000 UNIT/ML IJ SOLN
INTRAMUSCULAR | Status: DC | PRN
  Administered 2014-07-24: 40000 [IU] via INTRAVENOUS
  Administered 2014-07-24: 30000 [IU] via INTRAVENOUS

## 2014-07-24 MED ORDER — SODIUM CHLORIDE 0.9 % IV SOLN
INTRAVENOUS | Status: DC
  Administered 2014-07-24 – 2014-07-26 (×4): via INTRAVENOUS

## 2014-07-24 MED ORDER — PHENYLEPHRINE HCL 10 MG/ML IJ SOLN
0.0000 ug/min | INTRAVENOUS | Status: DC
  Administered 2014-07-24: 100 ug/min via INTRAVENOUS
  Filled 2014-07-24 (×3): qty 2

## 2014-07-24 MED ORDER — HYDROCORTISONE NA SUCCINATE PF 100 MG IJ SOLR
50.0000 mg | Freq: Three times a day (TID) | INTRAMUSCULAR | Status: DC
  Administered 2014-07-24 – 2014-07-27 (×8): 50 mg via INTRAVENOUS
  Filled 2014-07-24 (×11): qty 1

## 2014-07-24 MED ORDER — SODIUM CHLORIDE 0.9 % IV SOLN
Freq: Once | INTRAVENOUS | Status: AC
  Administered 2014-07-24: via INTRAVENOUS

## 2014-07-24 MED ORDER — SODIUM CHLORIDE 0.9 % IR SOLN
Status: DC | PRN
  Administered 2014-07-24: 6000 mL

## 2014-07-24 MED ORDER — ACETAMINOPHEN 160 MG/5ML PO SOLN
1000.0000 mg | Freq: Four times a day (QID) | ORAL | Status: DC
  Administered 2014-07-24 – 2014-07-25 (×2): 1000 mg
  Filled 2014-07-24 (×2): qty 40.6

## 2014-07-24 MED ORDER — SUCCINYLCHOLINE CHLORIDE 20 MG/ML IJ SOLN
INTRAMUSCULAR | Status: AC
  Filled 2014-07-24: qty 1

## 2014-07-24 MED ORDER — OXYCODONE HCL 5 MG PO TABS
5.0000 mg | ORAL_TABLET | ORAL | Status: DC | PRN
  Administered 2014-07-25 (×2): 5 mg via ORAL
  Administered 2014-07-26 (×4): 10 mg via ORAL
  Filled 2014-07-24: qty 2
  Filled 2014-07-24 (×2): qty 1
  Filled 2014-07-24 (×3): qty 2

## 2014-07-24 MED ORDER — PROPOFOL 10 MG/ML IV BOLUS
INTRAVENOUS | Status: DC | PRN
  Administered 2014-07-24: 50 mg via INTRAVENOUS

## 2014-07-24 MED ORDER — SODIUM CHLORIDE 0.9 % IV SOLN
INTRAVENOUS | Status: DC
  Filled 2014-07-24: qty 2.5

## 2014-07-24 MED ORDER — LACTATED RINGERS IV SOLN
INTRAVENOUS | Status: DC | PRN
  Administered 2014-07-24 (×2): via INTRAVENOUS

## 2014-07-24 MED ORDER — NOREPINEPHRINE BITARTRATE 1 MG/ML IV SOLN
2.0000 ug/min | INTRAVENOUS | Status: DC
  Administered 2014-07-25: 12 ug/min via INTRAVENOUS
  Administered 2014-07-25: 2 ug/min via INTRAVENOUS
  Administered 2014-07-25: 10 ug/min via INTRAVENOUS
  Administered 2014-07-25: 12 ug/min via INTRAVENOUS
  Administered 2014-07-26: 4 ug/min via INTRAVENOUS
  Filled 2014-07-24 (×5): qty 4

## 2014-07-24 MED ORDER — MICROFIBRILLAR COLL HEMOSTAT EX PADS
MEDICATED_PAD | CUTANEOUS | Status: DC | PRN
  Administered 2014-07-24 (×2): 1 via TOPICAL

## 2014-07-24 MED ORDER — DESMOPRESSIN ACETATE 4 MCG/ML IJ SOLN: 24.0000 ug | Freq: Once | INTRAMUSCULAR | Status: DC

## 2014-07-24 MED ORDER — SODIUM CHLORIDE 0.9 % IJ SOLN
INTRAMUSCULAR | Status: DC | PRN
  Administered 2014-07-24 (×9): via TOPICAL

## 2014-07-24 MED ORDER — EPHEDRINE SULFATE 50 MG/ML IJ SOLN
INTRAMUSCULAR | Status: AC
  Filled 2014-07-24: qty 1

## 2014-07-24 MED ORDER — DOPAMINE-DEXTROSE 3.2-5 MG/ML-% IV SOLN
3.0000 ug/kg/min | INTRAVENOUS | Status: DC
  Administered 2014-07-26 – 2014-07-28 (×2): 3 ug/kg/min via INTRAVENOUS
  Filled 2014-07-24 (×2): qty 250

## 2014-07-24 MED ORDER — LACTATED RINGERS IV SOLN
INTRAVENOUS | Status: DC | PRN
  Administered 2014-07-24 (×2): via INTRAVENOUS

## 2014-07-24 MED ORDER — GLUTARALDEHYDE 0.625% SOAKING SOLUTION
TOPICAL | Status: DC | PRN
  Filled 2014-07-24: qty 50

## 2014-07-24 MED ORDER — STERILE WATER FOR INJECTION IJ SOLN
INTRAMUSCULAR | Status: AC
  Filled 2014-07-24: qty 10

## 2014-07-24 MED ORDER — COAGULATION FACTOR VIIA RECOMB 1 MG IV SOLR
45.0000 ug/kg | INTRAVENOUS | Status: AC
  Administered 2014-07-24: 4000 ug via INTRAVENOUS
  Filled 2014-07-24: qty 4

## 2014-07-24 MED ORDER — BISACODYL 5 MG PO TBEC
10.0000 mg | DELAYED_RELEASE_TABLET | Freq: Every day | ORAL | Status: DC
  Administered 2014-07-26 – 2014-07-30 (×5): 10 mg via ORAL
  Filled 2014-07-24 (×5): qty 2

## 2014-07-24 MED ORDER — ALBUMIN HUMAN 5 % IV SOLN
250.0000 mL | INTRAVENOUS | Status: AC | PRN
  Administered 2014-07-24 – 2014-07-25 (×3): 250 mL via INTRAVENOUS
  Filled 2014-07-24: qty 250

## 2014-07-24 MED ORDER — VANCOMYCIN HCL IN DEXTROSE 1-5 GM/200ML-% IV SOLN
1000.0000 mg | Freq: Once | INTRAVENOUS | Status: AC
  Administered 2014-07-24: 1000 mg via INTRAVENOUS
  Filled 2014-07-24: qty 200

## 2014-07-24 MED ORDER — MAGNESIUM SULFATE 4000MG/100ML IJ SOLN
4.0000 g | Freq: Once | INTRAMUSCULAR | Status: AC
  Administered 2014-07-24: 4 g via INTRAVENOUS
  Filled 2014-07-24: qty 100

## 2014-07-24 MED ORDER — MIDAZOLAM HCL 2 MG/2ML IJ SOLN
INTRAMUSCULAR | Status: AC
  Filled 2014-07-24: qty 2

## 2014-07-24 MED ORDER — VANCOMYCIN HCL 1000 MG IV SOLR
INTRAVENOUS | Status: AC
  Administered 2014-07-24: 18:00:00
  Filled 2014-07-24: qty 1000

## 2014-07-24 MED ORDER — LACTATED RINGERS IV SOLN: 500.0000 mL | Freq: Once | INTRAVENOUS | Status: AC | PRN

## 2014-07-24 MED ORDER — MILRINONE IN DEXTROSE 20 MG/100ML IV SOLN
0.3000 ug/kg/min | INTRAVENOUS | Status: DC
  Administered 2014-07-25 (×2): 0.3 ug/kg/min via INTRAVENOUS
  Filled 2014-07-24 (×2): qty 100

## 2014-07-24 MED ORDER — METOPROLOL TARTRATE 12.5 MG HALF TABLET
12.5000 mg | ORAL_TABLET | Freq: Two times a day (BID) | ORAL | Status: DC
  Filled 2014-07-24 (×3): qty 1

## 2014-07-24 MED ORDER — ONDANSETRON HCL 4 MG/2ML IJ SOLN
4.0000 mg | Freq: Four times a day (QID) | INTRAMUSCULAR | Status: DC | PRN
  Administered 2014-07-27: 4 mg via INTRAVENOUS
  Filled 2014-07-24: qty 2

## 2014-07-24 MED ORDER — MIDAZOLAM HCL 5 MG/5ML IJ SOLN
INTRAMUSCULAR | Status: DC | PRN
  Administered 2014-07-24 (×2): 2 mg via INTRAVENOUS
  Administered 2014-07-24: 4 mg via INTRAVENOUS
  Administered 2014-07-24 (×2): 2 mg via INTRAVENOUS

## 2014-07-24 MED ORDER — SODIUM CHLORIDE 0.9 % IV SOLN
24.0000 ug | INTRAVENOUS | Status: AC
  Administered 2014-07-24: 24 ug via INTRAVENOUS
  Filled 2014-07-24: qty 6

## 2014-07-24 MED ORDER — SODIUM CHLORIDE 0.9 % IJ SOLN
INTRAMUSCULAR | Status: AC
  Filled 2014-07-24: qty 10

## 2014-07-24 MED ORDER — SODIUM CHLORIDE 0.9 % IV SOLN
Freq: Once | INTRAVENOUS | Status: AC
  Administered 2014-07-24: 22:00:00 via INTRAVENOUS

## 2014-07-24 MED ORDER — DEXTROSE 5 % IV SOLN
1.5000 g | Freq: Two times a day (BID) | INTRAVENOUS | Status: DC
  Administered 2014-07-24 – 2014-07-25 (×3): 1.5 g via INTRAVENOUS
  Filled 2014-07-24 (×4): qty 1.5

## 2014-07-24 MED ORDER — DOCUSATE SODIUM 100 MG PO CAPS
200.0000 mg | ORAL_CAPSULE | Freq: Every day | ORAL | Status: DC
  Administered 2014-07-26 – 2014-07-30 (×5): 200 mg via ORAL
  Filled 2014-07-24 (×5): qty 2

## 2014-07-24 MED ORDER — HEMOSTATIC AGENTS (NO CHARGE) OPTIME
TOPICAL | Status: DC | PRN
  Administered 2014-07-24: 4 via TOPICAL

## 2014-07-24 MED ORDER — BISACODYL 10 MG RE SUPP: 10.0000 mg | Freq: Every day | RECTAL | Status: DC

## 2014-07-24 MED ORDER — SODIUM CHLORIDE 0.45 % IV SOLN
INTRAVENOUS | Status: DC
  Administered 2014-07-24 – 2014-07-25 (×2): via INTRAVENOUS

## 2014-07-24 MED ORDER — ASPIRIN EC 325 MG PO TBEC
325.0000 mg | DELAYED_RELEASE_TABLET | Freq: Every day | ORAL | Status: DC
  Administered 2014-07-26: 325 mg via ORAL
  Filled 2014-07-24 (×3): qty 1

## 2014-07-24 MED ORDER — MIDAZOLAM HCL 10 MG/2ML IJ SOLN
INTRAMUSCULAR | Status: AC
  Filled 2014-07-24: qty 2

## 2014-07-24 MED ORDER — NITROGLYCERIN IN D5W 200-5 MCG/ML-% IV SOLN: 0.0000 ug/min | INTRAVENOUS | Status: DC

## 2014-07-24 MED ORDER — ACETAMINOPHEN 160 MG/5ML PO SOLN: 650.0000 mg | Freq: Once | ORAL | Status: AC

## 2014-07-24 MED ORDER — MORPHINE SULFATE 2 MG/ML IJ SOLN
1.0000 mg | INTRAMUSCULAR | Status: AC | PRN
  Administered 2014-07-25: 4 mg via INTRAVENOUS

## 2014-07-24 MED ORDER — SODIUM CHLORIDE 0.9 % IV SOLN
0.5000 g/h | Freq: Once | INTRAVENOUS | Status: DC
  Filled 2014-07-24 (×2): qty 20

## 2014-07-24 MED ORDER — HEPARIN SODIUM (PORCINE) 1000 UNIT/ML IJ SOLN
INTRAMUSCULAR | Status: AC
  Filled 2014-07-24: qty 1

## 2014-07-24 MED ORDER — SODIUM CHLORIDE 0.9 % IJ SOLN
3.0000 mL | Freq: Two times a day (BID) | INTRAMUSCULAR | Status: DC
  Administered 2014-07-25 – 2014-08-01 (×12): 3 mL via INTRAVENOUS

## 2014-07-24 MED ORDER — PROPOFOL 10 MG/ML IV BOLUS
INTRAVENOUS | Status: AC
  Filled 2014-07-24: qty 20

## 2014-07-24 MED ORDER — INSULIN REGULAR BOLUS VIA INFUSION
0.0000 [IU] | Freq: Three times a day (TID) | INTRAVENOUS | Status: DC
  Filled 2014-07-24: qty 10

## 2014-07-24 MED ORDER — DEXMEDETOMIDINE HCL IN NACL 200 MCG/50ML IV SOLN
0.4000 ug/kg/h | INTRAVENOUS | Status: DC
  Filled 2014-07-24: qty 50

## 2014-07-24 MED ORDER — LACTATED RINGERS IV SOLN
INTRAVENOUS | Status: DC
  Administered 2014-07-24: 22:00:00 via INTRAVENOUS

## 2014-07-24 SURGICAL SUPPLY — 186 items
ADAPTER CARDIO PERF ANTE/RETRO (ADAPTER) ×6 IMPLANT
APPLICATOR COTTON TIP 6IN STRL (MISCELLANEOUS) ×3 IMPLANT
APPLICATOR TIP COSEAL (VASCULAR PRODUCTS) ×3 IMPLANT
APPLIER CLIP 9.375 MED OPEN (MISCELLANEOUS)
APPLIER CLIP 9.375 SM OPEN (CLIP)
ATRICLIP EXCLUSION 40 STD HAND (Clip) ×3 IMPLANT
ATTRACTOMAT 16X20 MAGNETIC DRP (DRAPES) ×6 IMPLANT
BAG DECANTER FOR FLEXI CONT (MISCELLANEOUS) ×6 IMPLANT
BANDAGE ELASTIC 4 VELCRO ST LF (GAUZE/BANDAGES/DRESSINGS) ×3 IMPLANT
BANDAGE ELASTIC 6 VELCRO ST LF (GAUZE/BANDAGES/DRESSINGS) ×3 IMPLANT
BASKET HEART (ORDER IN 25'S) (MISCELLANEOUS) ×1
BASKET HEART (ORDER IN 25S) (MISCELLANEOUS) ×2 IMPLANT
BENZOIN TINCTURE PRP APPL 2/3 (GAUZE/BANDAGES/DRESSINGS) ×3 IMPLANT
BLADE STERNUM SYSTEM 6 (BLADE) ×6 IMPLANT
BLADE SURG 11 STRL SS (BLADE) ×9 IMPLANT
BLADE SURG ROTATE 9660 (MISCELLANEOUS) IMPLANT
BNDG GAUZE ELAST 4 BULKY (GAUZE/BANDAGES/DRESSINGS) ×3 IMPLANT
CANISTER SUCTION 2500CC (MISCELLANEOUS) ×6 IMPLANT
CANN PRFSN 3/8X14X24FR PCFC (MISCELLANEOUS)
CANN PRFSN 3/8XCNCT ST RT ANG (MISCELLANEOUS)
CANNULA AORTIC ROOT 9FR (CANNULA) ×3 IMPLANT
CANNULA EZ GLIDE 8.0 24FR (CANNULA) ×3 IMPLANT
CANNULA EZ GLIDE AORTIC 21FR (CANNULA) ×9 IMPLANT
CANNULA GUNDRY RCSP 15FR (MISCELLANEOUS) ×6 IMPLANT
CANNULA PRFSN 3/8X14X24FR PCFC (MISCELLANEOUS) IMPLANT
CANNULA PRFSN 3/8XCNCT RT ANG (MISCELLANEOUS) IMPLANT
CANNULA SOFTFLOW AORTIC 7M21FR (CANNULA) ×6 IMPLANT
CANNULA VEN MTL TIP RT (MISCELLANEOUS)
CANNULA VENNOUS METAL TIP 20FR (CANNULA) ×3 IMPLANT
CANNULA VENOUS LOW PROF 34X46 (CANNULA) ×6 IMPLANT
CARDIAC SUCTION (MISCELLANEOUS) ×3 IMPLANT
CATH CPB KIT OWEN (MISCELLANEOUS) ×3 IMPLANT
CATH FOLEY 2WAY SLVR  5CC 14FR (CATHETERS)
CATH FOLEY 2WAY SLVR 5CC 14FR (CATHETERS) IMPLANT
CATH HEART VENT LEFT (CATHETERS) ×2 IMPLANT
CATH ROBINSON RED A/P 18FR (CATHETERS) ×3 IMPLANT
CATH THORACIC 28FR (CATHETERS) IMPLANT
CATH THORACIC 28FR RT ANG (CATHETERS) IMPLANT
CATH THORACIC 36FR (CATHETERS) ×3 IMPLANT
CATH THORACIC 36FR RT ANG (CATHETERS) ×3 IMPLANT
CLAMP ISOLATOR SYNERGY LG (MISCELLANEOUS) ×6 IMPLANT
CLIP APPLIE 9.375 MED OPEN (MISCELLANEOUS) IMPLANT
CLIP APPLIE 9.375 SM OPEN (CLIP) IMPLANT
CLIP FOGARTY SPRING 6M (CLIP) IMPLANT
CLIP TI MEDIUM 24 (CLIP) IMPLANT
CLIP TI WIDE RED SMALL 24 (CLIP) IMPLANT
CONN 1/2X1/2X1/2  BEN (MISCELLANEOUS) ×1
CONN 1/2X1/2X1/2 BEN (MISCELLANEOUS) ×2 IMPLANT
CONN 3/8X1/2 ST GISH (MISCELLANEOUS) ×6 IMPLANT
CONN ST 1/4X3/8  BEN (MISCELLANEOUS) ×3
CONN ST 1/4X3/8 BEN (MISCELLANEOUS) ×6 IMPLANT
CONN Y 3/8X3/8X3/8  BEN (MISCELLANEOUS)
CONN Y 3/8X3/8X3/8 BEN (MISCELLANEOUS) IMPLANT
COUNTER NEEDLE 20 DBL MAG RED (NEEDLE) ×3 IMPLANT
COVER SURGICAL LIGHT HANDLE (MISCELLANEOUS) ×9 IMPLANT
CRADLE DONUT ADULT HEAD (MISCELLANEOUS) ×6 IMPLANT
DERMABOND ADHESIVE PROPEN (GAUZE/BANDAGES/DRESSINGS) ×1
DERMABOND ADVANCED .7 DNX6 (GAUZE/BANDAGES/DRESSINGS) ×2 IMPLANT
DEVICE SUT CK QUICK LOAD INDV (Prosthesis & Implant Heart) ×6 IMPLANT
DEVICE SUT CK QUICK LOAD MINI (Prosthesis & Implant Heart) ×3 IMPLANT
DRAIN CHANNEL 32F RND 10.7 FF (WOUND CARE) ×6 IMPLANT
DRAPE BILATERAL SPLIT (DRAPES) IMPLANT
DRAPE CARDIOVASCULAR INCISE (DRAPES) ×1
DRAPE CV SPLIT W-CLR ANES SCRN (DRAPES) IMPLANT
DRAPE INCISE IOBAN 66X45 STRL (DRAPES) ×9 IMPLANT
DRAPE SLUSH/WARMER DISC (DRAPES) ×6 IMPLANT
DRAPE SRG 135X102X78XABS (DRAPES) ×2 IMPLANT
DRSG COVADERM 4X14 (GAUZE/BANDAGES/DRESSINGS) ×3 IMPLANT
ELECT REM PT RETURN 9FT ADLT (ELECTROSURGICAL) ×12
ELECTRODE REM PT RTRN 9FT ADLT (ELECTROSURGICAL) ×8 IMPLANT
GAUZE SPONGE 4X4 12PLY STRL (GAUZE/BANDAGES/DRESSINGS) ×12 IMPLANT
GLOVE BIO SURGEON STRL SZ 6 (GLOVE) IMPLANT
GLOVE BIO SURGEON STRL SZ 6.5 (GLOVE) IMPLANT
GLOVE BIO SURGEON STRL SZ7 (GLOVE) IMPLANT
GLOVE BIO SURGEON STRL SZ7.5 (GLOVE) IMPLANT
GLOVE BIOGEL PI IND STRL 6 (GLOVE) IMPLANT
GLOVE BIOGEL PI IND STRL 6.5 (GLOVE) IMPLANT
GLOVE BIOGEL PI IND STRL 7.0 (GLOVE) IMPLANT
GLOVE BIOGEL PI INDICATOR 6 (GLOVE)
GLOVE BIOGEL PI INDICATOR 6.5 (GLOVE)
GLOVE BIOGEL PI INDICATOR 7.0 (GLOVE)
GLOVE EUDERMIC 7 POWDERFREE (GLOVE) IMPLANT
GLOVE ORTHO TXT STRL SZ7.5 (GLOVE) ×9 IMPLANT
GOWN STRL REUS W/ TWL LRG LVL3 (GOWN DISPOSABLE) ×16 IMPLANT
GOWN STRL REUS W/TWL LRG LVL3 (GOWN DISPOSABLE) ×8
HEMOSTAT POWDER SURGIFOAM 1G (HEMOSTASIS) ×18 IMPLANT
HEMOSTAT SURGICEL 2X4 FIBR (HEMOSTASIS) ×6 IMPLANT
INSERT FOGARTY 61MM (MISCELLANEOUS) IMPLANT
INSERT FOGARTY XLG (MISCELLANEOUS) ×6 IMPLANT
KIT BASIN OR (CUSTOM PROCEDURE TRAY) ×6 IMPLANT
KIT DILATOR VASC 18G NDL (KITS) ×6 IMPLANT
KIT DRAINAGE VACCUM ASSIST (KITS) ×6 IMPLANT
KIT ROOM TURNOVER OR (KITS) ×6 IMPLANT
KIT SUCTION CATH 14FR (SUCTIONS) ×24 IMPLANT
KIT SUT CK MINI COMBO 4X17 (Prosthesis & Implant Heart) ×3 IMPLANT
KIT VASOVIEW W/TROCAR VH 2000 (KITS) ×3 IMPLANT
LEAD PACING MYOCARDI (MISCELLANEOUS) ×3 IMPLANT
LINE VENT (MISCELLANEOUS) ×3 IMPLANT
LOOP VESSEL SUPERMAXI WHITE (MISCELLANEOUS) ×3 IMPLANT
MARKER GRAFT CORONARY BYPASS (MISCELLANEOUS) ×9 IMPLANT
MARKER SKIN DUAL TIP RULER LAB (MISCELLANEOUS) ×3 IMPLANT
NDL SUT 1 .5 CRC FRENCH EYE (NEEDLE) ×4 IMPLANT
NEEDLE FRENCH EYE (NEEDLE) ×2
NS IRRIG 1000ML POUR BTL (IV SOLUTION) ×30 IMPLANT
PACK OPEN HEART (CUSTOM PROCEDURE TRAY) ×6 IMPLANT
PAD ARMBOARD 7.5X6 YLW CONV (MISCELLANEOUS) ×12 IMPLANT
PAD ELECT DEFIB RADIOL ZOLL (MISCELLANEOUS) ×3 IMPLANT
PENCIL BUTTON HOLSTER BLD 10FT (ELECTRODE) ×3 IMPLANT
PROBE CRYO2-ABLATION MALLABLE (MISCELLANEOUS) ×3 IMPLANT
PUNCH AORTIC ROT 4.0MM RCL 40 (MISCELLANEOUS) ×3 IMPLANT
PUNCH AORTIC ROTATE 4.0MM (MISCELLANEOUS) IMPLANT
PUNCH AORTIC ROTATE 4.5MM 8IN (MISCELLANEOUS) IMPLANT
PUNCH AORTIC ROTATE 5MM 8IN (MISCELLANEOUS) IMPLANT
SEALANT SURG COSEAL 8ML (VASCULAR PRODUCTS) ×3 IMPLANT
SET CARDIOPLEGIA MPS 5001102 (MISCELLANEOUS) ×3 IMPLANT
SET IRRIG TUBING LAPAROSCOPIC (IRRIGATION / IRRIGATOR) ×9 IMPLANT
SOLUTION ANTI FOG 6CC (MISCELLANEOUS) ×3 IMPLANT
SPONGE GAUZE 4X4 12PLY STER LF (GAUZE/BANDAGES/DRESSINGS) ×6 IMPLANT
SPONGE LAP 18X18 X RAY DECT (DISPOSABLE) ×18 IMPLANT
SPONGE LAP 4X18 X RAY DECT (DISPOSABLE) ×15 IMPLANT
SUCKER INTRACARDIAC WEIGHTED (SUCKER) ×3 IMPLANT
SURGIFLO W/THROMBIN 8M KIT (HEMOSTASIS) ×12 IMPLANT
SUT BONE WAX W31G (SUTURE) ×6 IMPLANT
SUT ETHIBON 2 0 V 52N 30 (SUTURE) ×21 IMPLANT
SUT ETHIBON EXCEL 2-0 V-5 (SUTURE) IMPLANT
SUT ETHIBOND (SUTURE) ×6 IMPLANT
SUT ETHIBOND 2 0 SH (SUTURE) ×11 IMPLANT
SUT ETHIBOND 2 0 SH 36X2 (SUTURE) ×4 IMPLANT
SUT ETHIBOND 2 0 V4 (SUTURE) IMPLANT
SUT ETHIBOND 2 0V4 GREEN (SUTURE) IMPLANT
SUT ETHIBOND 2-0 RB-1 WHT (SUTURE) ×6 IMPLANT
SUT ETHIBOND 4 0 RB 1 (SUTURE) ×12 IMPLANT
SUT ETHIBOND 4 0 TF (SUTURE) IMPLANT
SUT ETHIBOND 5 0 C 1 30 (SUTURE) ×3 IMPLANT
SUT ETHIBOND NAB MH 2-0 36IN (SUTURE) ×3 IMPLANT
SUT ETHIBOND V-5 VALVE (SUTURE) IMPLANT
SUT ETHIBOND X763 2 0 SH 1 (SUTURE) ×21 IMPLANT
SUT MNCRL AB 3-0 PS2 18 (SUTURE) ×12 IMPLANT
SUT MNCRL AB 4-0 PS2 18 (SUTURE) IMPLANT
SUT PDS AB 1 CTX 36 (SUTURE) ×12 IMPLANT
SUT PROLENE 2 0 SH DA (SUTURE) IMPLANT
SUT PROLENE 3 0 SH 1 (SUTURE) ×3 IMPLANT
SUT PROLENE 3 0 SH DA (SUTURE) ×3 IMPLANT
SUT PROLENE 3 0 SH1 36 (SUTURE) ×6 IMPLANT
SUT PROLENE 4 0 RB 1 (SUTURE) ×21
SUT PROLENE 4 0 SH DA (SUTURE) ×9 IMPLANT
SUT PROLENE 4-0 RB1 .5 CRCL 36 (SUTURE) ×42 IMPLANT
SUT PROLENE 5 0 C 1 36 (SUTURE) ×6 IMPLANT
SUT PROLENE 6 0 C 1 30 (SUTURE) ×18 IMPLANT
SUT PROLENE 7.0 RB 3 (SUTURE) ×18 IMPLANT
SUT PROLENE 8 0 BV175 6 (SUTURE) IMPLANT
SUT PROLENE BLUE 7 0 (SUTURE) ×3 IMPLANT
SUT PROLENE POLY MONO (SUTURE) IMPLANT
SUT SILK  1 MH (SUTURE) ×4
SUT SILK 1 MH (SUTURE) ×8 IMPLANT
SUT SILK 2 0 SH CR/8 (SUTURE) ×3 IMPLANT
SUT SILK 3 0 SH CR/8 (SUTURE) IMPLANT
SUT STEEL 6MS V (SUTURE) IMPLANT
SUT STEEL STERNAL CCS#1 18IN (SUTURE) IMPLANT
SUT STEEL SZ 6 DBL 3X14 BALL (SUTURE) IMPLANT
SUT VIC AB 1 CTX 36 (SUTURE)
SUT VIC AB 1 CTX36XBRD ANBCTR (SUTURE) IMPLANT
SUT VIC AB 2-0 CT1 27 (SUTURE) ×1
SUT VIC AB 2-0 CT1 TAPERPNT 27 (SUTURE) ×2 IMPLANT
SUT VIC AB 2-0 CTX 27 (SUTURE) IMPLANT
SUT VIC AB 3-0 SH 27 (SUTURE)
SUT VIC AB 3-0 SH 27X BRD (SUTURE) IMPLANT
SUT VIC AB 3-0 X1 27 (SUTURE) ×3 IMPLANT
SUT VICRYL 4-0 PS2 18IN ABS (SUTURE) IMPLANT
SUTURE E-PAK OPEN HEART (SUTURE) ×3 IMPLANT
SYR BULB IRRIGATION 50ML (SYRINGE) ×3 IMPLANT
SYS ATRICLIP LAA EXCLUSION 45 (CLIP) IMPLANT
SYSTEM SAHARA CHEST DRAIN ATS (WOUND CARE) ×6 IMPLANT
TAPE CLOTH SURG 4X10 WHT LF (GAUZE/BANDAGES/DRESSINGS) ×6 IMPLANT
TOWEL OR 17X24 6PK STRL BLUE (TOWEL DISPOSABLE) ×12 IMPLANT
TOWEL OR 17X26 10 PK STRL BLUE (TOWEL DISPOSABLE) ×12 IMPLANT
TRAY FOLEY IC TEMP SENS 14FR (CATHETERS) ×3 IMPLANT
TRAY FOLEY IC TEMP SENS 16FR (CATHETERS) ×6 IMPLANT
TUBE CONNECTING 12X1/4 (SUCTIONS) ×3 IMPLANT
TUBING INSUFFLATION 10FT LAP (TUBING) ×6 IMPLANT
UNDERPAD 30X30 INCONTINENT (UNDERPADS AND DIAPERS) ×6 IMPLANT
VALVE AORTIC SZ 21 (Prosthesis & Implant Heart) ×3 IMPLANT
VALVE MAGNA MITRAL 29MM (Prosthesis & Implant Heart) ×3 IMPLANT
VENT LEFT HEART 12002 (CATHETERS) ×3
WATER STERILE IRR 1000ML POUR (IV SOLUTION) ×12 IMPLANT
YANKAUER SUCT BULB TIP NO VENT (SUCTIONS) ×6 IMPLANT

## 2014-07-24 NOTE — Transfer of Care (Signed)
Immediate Anesthesia Transfer of Care Note  Patient: Justin [name]  Procedure(s) Performed: Procedure(s): ROOT REPLACEMENT WITH BIOPROSTHETIC PORCINE AORTIC ROOT REIMPLANTATION OF LEFT MAIN AND RIGHT CORONARY ARTERIES (N/A) INTRAOPERATIVE TRANSESOPHAGEAL ECHOCARDIOGRAM (N/A) MAZE (N/A) CORONARY ARTERY BYPASS GRAFTING (CABG), on pump, times one, using right greater saphenous vein harvested endoscopically. (N/A) MITRAL VALVE (MV) REPLACEMENT (N/A)  Patient Location: SICU  Anesthesia Type:General  Level of Consciousness: sedated  Airway & Oxygen Therapy: Patient remains intubated per anesthesia plan and Patient placed on Ventilator (see vital sign flow sheet for setting)  Post-op Assessment: Report given to PACU RN and Post -op Vital signs reviewed and stable  Post vital signs: Reviewed and stable  Complications: No apparent anesthesia complications

## 2014-07-24 NOTE — OR Nursing (Signed)
1906 first call made to SICU, 1929 second call made to SICU

## 2014-07-24 NOTE — Progress Notes (Addendum)
[facility] PROGRESS NOTE  Justin [name] KRC:[id number] DOB: 11/09/1933 DOA: 07/31/2014 PCP: Penni Homans, MD Pt gone for surgery this am-AV and MV repair, CABG on 07/07/2014  Planned per  CTS, he remains in [place] on recheck this PM. He is to be transferred to [place] following surgery. Appreciate CTS assistance, please call as needed.    [place] [facility] Pager [phone]. If 7PM-7AM, please contact night-coverage at [email or web address], password [facility] 07/20/2014, 3:52 PM  LOS: 18 days

## 2014-07-24 NOTE — Anesthesia Procedure Notes (Signed)
Procedures   The patient was identified and consent obtained.  TO was performed, and full barrier precautions were used.  The skin was anesthetized with lidocaine.  Once the vein was located with the 22 ga. needle using ultrasound guidance , the wire was inserted into the vein.  The wire location was confirmed with ultrasound.  The insertion site was dilated and the introducer was carefully inserted and sutured in place. The PAC was checked, and floated into the PA.  Once in the PA, the catheter was secured. The patient tolerated the procedure well.  CXR was ordered for PACU. Start: 0701 End: 1624 J. Tedra Senegal, MD The patient was identified and consent was given.  Hand hygiene and sterile gloves were used.  Following the time-out, the brachial  region was prepped and draped in a sterile fashion. The skin was anesthetized with local anesthesia.  The artery was located and a wire was inserted.  The skin was dilated and a 20cm, 20ga catheter was inserted.  The placement was confirmed by arterial flow.  The catheter was secured with a sterile dressing and sutured in place. The patient tolerated the procedure well. Start: 4695 End: 0722 J. Tedra Senegal, MD

## 2014-07-24 NOTE — Anesthesia Postprocedure Evaluation (Signed)
  Anesthesia Post-op Note  Patient: Justin [name]  Procedure(s) Performed: Procedure(s): ROOT REPLACEMENT WITH BIOPROSTHETIC PORCINE AORTIC ROOT REIMPLANTATION OF LEFT MAIN AND RIGHT CORONARY ARTERIES (N/A) INTRAOPERATIVE TRANSESOPHAGEAL ECHOCARDIOGRAM (N/A) MAZE (N/A) CORONARY ARTERY BYPASS GRAFTING (CABG), on pump, times one, using right greater saphenous vein harvested endoscopically. (N/A) MITRAL VALVE (MV) REPLACEMENT (N/A)  Patient Location: ICU  Anesthesia Type:General  Level of Consciousness: sedated  Airway and Oxygen Therapy: Patient remains intubated per anesthesia plan  Post-op Pain: none  Post-op Assessment: Post-op Vital signs reviewed, Patient's Cardiovascular Status Stable, Respiratory Function Stable and Patent Airway  Post-op Vital Signs: Reviewed and stable  Last Vitals:  Filed Vitals:   07/09/2014 0547  BP: 152/48  Pulse: 78  Temp:   Resp: 16    Complications: No apparent anesthesia complications

## 2014-07-24 NOTE — Anesthesia Preprocedure Evaluation (Addendum)
Anesthesia Evaluation  Patient identified by MRN, date of birth, ID band Patient awake    Reviewed: Allergy & Precautions, H&P , NPO status , Patient's Chart, lab work & pertinent test results  Airway Mallampati: II TM Distance: >3 FB Neck ROM: Full    Dental  (+) Dental Advisory Given, Teeth Intact   Pulmonary shortness of breath, sleep apnea , former smoker,    Pulmonary exam normal       Cardiovascular hypertension, Pt. on medications + CAD, + Past MI and +CHF + dysrhythmias + pacemaker  Left ventricle: The cavity size was mildly dilated. Systolic   function was normal. The estimated ejection fraction was in the   range of 50% to 55%. Wall motion was normal; there were no   regional wall motion abnormalities. Features are consistent with   a pseudonormal left ventricular filling pattern, with concomitant   abnormal relaxation and increased filling pressure (grade 2   diastolic dysfunction). Doppler parameters are consistent with   elevated ventricular end-diastolic filling pressure. - Aortic valve: Trileaflet; mildly thickened leaflets. There was   moderate regurgitation. - Mitral valve: There was moderate to severe regurgitation.    Neuro/Psych TIA   GI/Hepatic negative GI ROS, Neg liver ROS,   Endo/Other  negative endocrine ROS  Renal/GU Renal InsufficiencyRenal disease     Musculoskeletal   Abdominal   Peds  Hematology  (+) anemia ,   Anesthesia Other Findings   Reproductive/Obstetrics                        Anesthesia Physical Anesthesia Plan  ASA: IV  Anesthesia Plan: General   Post-op Pain Management:    Induction: Intravenous  Airway Management Planned: Oral ETT  Additional Equipment: Arterial line, CVP, 3D TEE and PA Cath  Intra-op Plan:   Post-operative Plan: Possible Post-op intubation/ventilation  Informed Consent: I have reviewed the patients History and Physical,  chart, labs and discussed the procedure including the risks, benefits and alternatives for the proposed anesthesia with the patient or authorized representative who has indicated his/her understanding and acceptance.   Dental advisory given  Plan Discussed with: Anesthesiologist, Surgeon and CRNA  Anesthesia Plan Comments:        Anesthesia Quick Evaluation

## 2014-07-24 NOTE — Progress Notes (Signed)
Justin [name] is ready for surgery this morning. His family is with him.  His platelet count is 88,000. This really is a incredible response. I don't think he was transfused with platelets recently. He did get Nplate on Monday.  I would have to think that his risk of bleeding should be minimal. His coagulation parameters all are normal. His renal function is better.  Again, he is responded very nicely to interventions to improve his platelet count. He will be very interesting to see if his platelet count and hemoglobin normalized with improvement and repair of the valves secondary to endocarditis.  He has had no bleeding. He has had no problems with fever. There has been no nausea or vomiting.  We will continue to follow closely after his surgery. I'm sure the platelet count will drop secondary to being on the bypass machine. If he needs platelets during or after the procedure, again, this would not really surprise me.  His prednisone dose only 10 mg. I don't see this as an issue with respect to him having any healing problems. He only has been on steroids for a couple of weeks. We have been able to taper him down quite nicely. I will bring his steroid dose down a little bit more. We will then see how things go after surgery.  We will certainly pray hard for God's hand to be in the operating room.  Pete E.  Phillipians 4:6-7

## 2014-07-24 NOTE — Brief Op Note (Addendum)
07/22/2014 - 07/29/2014  2:18 PM  PATIENT:  Justin [name]  80 y.o. male  PRE-OPERATIVE DIAGNOSIS: Mitral and aortic valve endocarditis, moderate AI, moderate to severe MR, CAD, atrial fibrillation  POST-OPERATIVE DIAGNOSIS: Mitral and aortic valve endocarditis, moderate AI, moderate to severe MR, CAD, atrial fibrillation    PROCEDURE:    AORTIC ROOT REPLACEMENT (21 mm Medtronic Freestyle porcine aortic root/valve)  REIMPLANTATION OF LEFT MAIN AND RIGHT CORONARY ARTERIES  MITRAL VALVE REPLACEMENT (29 mm Edwards Magna Ease pericardial tissue valve)  CORONARY ARTERY BYPASS GRAFTING x 1 ([id number])  MAZE PROCEDURE (cryothermy and radiofrequency)  ENDOSCOPIC VEIN HARVEST RIGHT THIGH   SURGEON:  Rexene Alberts, MD   ASSISTANTS: Gaye Pollack, MD, Suzzanne Cloud, PA-C and Jadene Pierini, PA-C  ANESTHESIA:  Duane Boston, MD and Laurie Panda, MD  CROSSCLAMP TIME:   294'  CARDIOPULMONARY BYPASS TIME: 369'  FINDINGS:  Active bacterial endocarditis with large vegetation adherent to both the aortic and the mitral valve  Perforation of left cusp of aortic valve  Incomplete perforation of anterior leaflet of mitral valve  Type I dysfunction of aortic valve with moderate aortic insufficiency  Type I dysfunction of mitral valve with moderate-severe mitral  Regurgitation  Normal LV systolic function  Mild LVH  Relatively small aortic root requiring porcine stentless aortic root replacement  Severe coagulapathy after reversal of heparin   Aortic Valve Etiology   Aortic Insufficiency:  Moderate  Aortic Valve Disease:  Yes.  Aortic Stenosis:  No.  Etiology (Choose at least one and up to  5 etiologies):  Endocarditis without root abscess   Aortic Valve  Procedure Performed:  Replacement: Yes.  Other Bioprosthetic porcine aortic root and valve. Implant Model Number:995, Size:21, Unique Device Identifier:[id number]  Repair/Reconstruction: No.   Aortic Annular Enlargement:  Yes    Mitral Valve Etiology  MV Insufficiency: Moderate  MV Disease: Yes.  MV Stenosis: No mitral valve stenosis.  MV Disease Functional Class: MV Disease Functional Class: Type I.   Etiology (Choose at least one and up to five): Degenerative. and Endocarditis.  MV Lesions (Choose at least one): No additional lesions.     Mitral Valve Procedure Performed:  Replacement: Yes  Both Anterior and Posterior Mitral Chords Preserved.  Implant: Bioprosthetic Valve: Implant model number 7300TFX, Size 29, Unique Device Identifier [id number].    Maze Procedure  Radiofrequency:  Yes.  Bipolar: Yes.  Cut-and-sew:  No.  Cryo: Yes  Lesions (select all that apply):    1   Pulmonary Vein Isolation,    2   Box Lesion,   3a  Inferior Pulmonary Vein Connecting Lesion,   3b  Superior Pulmonary Vein Connecting Lesion,     4  Posterior Mirtal Annular Line,     6  Mitral Valve Cryo Lesion,     7  LAA Ligation/Removal,     9   Intercaval Line to Tricuspid Annulus ("T" Lesion),    11  Intercaval Line,    12  Tricuspid Annular Line to RAA,    13  Tricuspid Cryo Lesion,   15a  RAA Lateral Wall (Short) and   15b  RAA Lateral Wall to "T" Lesion  COMPLICATIONS: None  BASELINE WEIGHT: 86 kg  PATIENT DISPOSITION:   TO SICU IN STABLE CONDITION  [name],[name] 07/20/2014 7:38 PM

## 2014-07-24 NOTE — Op Note (Addendum)
CARDIOTHORACIC SURGERY OPERATIVE NOTE  Date of Procedure:  08/02/2014  Preoperative Diagnosis:   Bacterial Endocarditis  Moderate Aortic Insufficiency  Moderate-Severe Mitral Regurgitation  Single Vessel Coronary Artery Disease  Recurrent Paroxysmal Atrial Fibrillation  Postoperative Diagnosis: Same  Procedure:   Aortic Root Replacement   Medtronic Freestyle Aortic Root Heart Valve (size 1mm, model #995, serial #[id number])  Reimplantation of Left Main and Right Coronary Arteries   Mitral Valve Replacement   Edwards Magna Mitral Bovine Bioprosthetic Tissue Valve (size 20mm, model #7300TFX, serial #[id number])   Coronary Artery Bypass Grafting   Reversed Saphenous Vein Graft to First Obtuse Marginal Branch of Left Circumflex Coronary Artery  Endoscopic Vein Harvest from Right Thigh   Maze Procedure   complete bilateral atrial atrial lesion set using bipolar radiofrequency and cryothermy ablation  clipping of left atrial appendage (Atriclip size 68mm)    Surgeon: Valentina Gu. Roxy Manns, MD  Assistants: Gaye Pollack, MD, Suzzanne Cloud, PA-C and Jadene Pierini, [place]  Anesthesia: Delma Freeze, MD and Laurie Panda, MD  Operative Findings: Active bacterial endocarditis with large vegetation adherent to both the aortic and the mitral valve  Perforation of left cusp of aortic valve  Incomplete perforation of anterior leaflet of mitral valve  Type I dysfunction of aortic valve with moderate aortic insufficiency  Type I dysfunction of mitral valve with moderate-severe mitral regurgitation  Normal LV systolic function  Mild LVH  Relatively small aortic root requiring porcine stentless aortic root replacement  Severe coagulapathy after reversal of heparin                      BRIEF CLINICAL NOTE AND INDICATIONS FOR SURGERY  Patient is a 80 year old white male with known history of chronic mitral regurgitation, atrial fibrillation, sinus node dysfunction and  tachybradycardia syndrome with previous pacemaker placement, and 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 Prior to admission to the hospital the patient had a prolonged illness associated with intermittent fevers, weight loss, and thrombocytopenia. 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 However, the patient presented or acutely with fever and hypotension, and blood cultures grew enterococcus. Urine cultures grew Enterobacter cloacae.  Transesophageal echocardiogram was performed demonstrating large vegetation adherent to the aortic valve with moderate aortic insufficiency and moderate to severe mitral regurgitation. The patient eventually underwent diagnostic cardiac catheterization which revealed single-vessel coronary artery disease. Prior to catheterization the patient was transfused platelets for profound thrombocytopenia. The patient's pre-existing pacemaker was removed and notably during this admission the patient had no further episodes of bradycardia. The patient was initially seen in consultation by Dr. Prescott Gum on 07/05/2014.  Clinically the patient improved with antibiotic therapy. Dosages of prednisone were tapered given the diagnosis of bacterial endocarditis. The patient was noted to have stable chronic kidney disease and improving symptoms of congestive heart failure. Platelet count stabilized although remained <100,000.   The patient has been seen in consultation and counseled at length regarding the indications, risks and potential benefits of surgery.  All questions have been answered, and the patient provides full informed consent for the operation as described.    DETAILS OF THE OPERATIVE PROCEDURE  Preparation:  The patient is brought to the operating room on the above mentioned date and central monitoring was established by the  anesthesia team including placement of Swan-Ganz catheter and brachial arterial line. The patient is placed in the  supine position on the operating table.  Intravenous antibiotics are administered. General endotracheal anesthesia is induced uneventfully. A Foley catheter is placed.  Baseline transesophageal echocardiogram was performed.  Findings were notable for an obvious vegetation adherent to the ventricular surface of the left cusp of the aortic valve. The vegetation was mobile. There was moderate aortic insufficiency. There was moderate to severe mitral regurgitation. There appeared to be normal leaflet mobility of both the aortic and mitral valves. Left ventricular function appeared normal. There was mild to moderate left ventricular hypertrophy.  The patient's chest, abdomen, both groins, and both lower extremities are prepared and draped in a sterile manner. A time out procedure is performed.   Surgical Approach and Conduit Harvest:  A median sternotomy incision was performed.  The pericardium is opened. The ascending aorta is normal in appearance.  A portion of the patient's pericardium is removed and tanned in glutaraldehyde solution for 3 minutes, after which time is rinsed in consecutive baths of saline for later use during valve repair. Simultaneously, saphenous vein is obtained from the patient's right thigh using endoscopic vein harvest technique. The saphenous vein is notably good quality conduit. After removal of the saphenous vein, the small surgical incisions in the lower extremity are closed with absorbable suture.    Extracorporeal Cardiopulmonary Bypass and Myocardial Protection:  The right common femoral vein is cannulated using the Seldinger technique and a guidewire advanced into the right atrium using TEE guidance.  The patient is heparinized systemically and the femoral vein cannulated using a 22 Fr long femoral venous cannula.  The ascending aorta is cannulated for  cardiopulmonary bypass.  Adequate heparinization is verified.   Attempts to place a retrograde cardioplegia cannula through the right atrium into the coronary sinus are unsuccessful.   The entire pre-bypass portion of the operation was notable for stable hemodynamics.  Cardiopulmonary bypass was begun and the surface of the heart is inspected.  A second venous cannula is placed directly into the superior vena cava.   Umbilical tapes are placed around the superior and inferior vena cava. A small incision is made in the right atrium and a retrograde cardioplegia cannula is placed under direct vision into the coronary sinus.  A cardioplegia cannula is placed in the ascending aorta.  A temperature probe was placed in the interventricular septum.  The patient is cooled to 32C systemic temperature.  The aortic cross clamp is applied and cold blood cardioplegia is delivered initially in an antegrade fashion through the aortic root.   Supplemental cardioplegia is given retrograde through the coronary sinus catheter.  Iced saline slush is applied for topical hypothermia.  The initial cardioplegic arrest is rapid with early diastolic arrest.  Repeat doses of cardioplegia are administered intermittently throughout the entire cross clamp portion of the operation through the aortic root, down the subsequently placed vein graft and through the coronary sinus catheter in order to maintain completely flat electrocardiogram and septal myocardial temperature below 15C.  Myocardial protection was felt to be excellent.   Aortic and Mitral Valve Debriedment:  An oblique transverse aortotomy incision was performed.  The aortic valve was inspected and notable for Active bacterial endocarditis. There were active vegetations involving all 3 leaflets of the aortic valve with a particularly large vegetation adherent to the ventricular surface of the left cusp which also extended down and was adherent to the ventricular surface of  the anterior leaflet of the mitral valve. There was a perforation in the left cusp of aortic valve.  Vegetations were cultured and sent to pathology.  The aortic valve leaflets were excised sharply.  There was no extension into the aortic annulus. The aortic annulus was normal and free of calcification. There is no surrounding abscess. Vegetation extending into the ventricular surface of the anterior leaflet of the mitral valve was debrided. This required excision of a portion of the base of the leaflet. However, the entire free margin of the anterior leaflet could be preserved.  The aortic annulus and the mitral valve were painted with Betadine solution.  The aortic root and left ventricle were irrigated with copious cold saline solution.  At this juncture all instruments were changed to new sterile instruments.     Coronary Artery Bypass Grafting:  The first obtuse marginal branch of the left circumflex coronary artery was grafted using a reversed saphenous vein graft in an end-to-side fashion.  At the site of distal anastomosis the target vessel was good quality and measured approximately 1.8 mm in diameter.   Maze Procedure (left atrial lesion set):  The AtriCure Synergy bipolar radiofrequency ablation clamp is used for all radiofrequency ablation lesions for the maze procedure.  The Atricure CryoICE nitrous oxide cryothermy system is utilized for all cryothermy ablation lesions.   The heart is retracted towards the surgeon's side and the left sided pulmonary veins exposed.  An elliptical ablation lesion is created around the base of the left sided pulmonary veins.  A similar elliptical lesion was created around the base of the left atrial appendage.  The left atrial appendage was obliterated using an Atricure left atrial appendage clip (Atriclip, size 94mm).  The heart was replaced into the pericardial sac.  A left atriotomy incision was performed through the interatrial groove and extended  partially across the back wall of the left atrium after opening the oblique sinus inferiorly.  The floor of the left atrium and the mitral valve were exposed using a self-retaining retractor.    An ablation lesion was placed around the right sided pulmonary veins using the bipolar clamp with one limb of the clamp along the endocardial surface and one along the epicardial surface posteriorly.  A bipolar ablation lesion was placed across the dome of the left atrium from the cephalad apex of the atriotomy incision to reach the cephalad apex of the elliptical lesion around the left sided pulmonary veins.  A similar bipolar lesion was placed across the back wall of the left atrium from the caudad apex of the atriotomy incision to reach the caudad apex of the elliptical lesion around the left sided pulmonary veins, thereby completing a box.  Finally another bipolar lesion was placed across the back wall of the left atrium from the caudad apex of the atriotomy incision towards the posterior mitral valve annulus.  This lesion was completed along the endocardial surface onto the posterior mitral annulus with a 3 minute duration cryothermy lesion, followed by a second cryothermy lesion along the posterior epicardial surface of the left atrium to the coronary sinus.  This completes the entire left side lesion set of the Cox maze procedure.   Mitral Valve Replacement:  The mitral valve was inspected and notable for the pre-existing elliptical shaped hole in the anterior leaflet. There was moderate sclerosis and scarring of the free margin of the anterior leaflet with moderate sclerosis and some mild restriction of the posterior leaflet. However, overall leaflet mobility appeared normal. An attempt at valve repair is felt to be feasible.   Interrupted 2-0 Ethibond horizontal mattress sutures  are placed around the anterior mitral annulus to be utilized later for ring annuloplasty. At this juncture the sutures are placed  to facilitate symmetrical suspension of the anterior annulus.  An elliptical patch of the patient's autologous pericardium is prepared for patch closure of the defect in the anterior leaflet. This is sewn in place using running 4-0 Prolene suture. After completion of the patch closure the valve was tested with saline. However, there appears to be in adequate leaflet coaptation primarily because of the significant fibrosis involving the free margin of the anterior leaflet and mild restriction of the posterior leaflet. Further attempt at valve repair is aborted.  The anterior leaflet of the mitral valve was excised sharply, leaving a small rim of the free margin and the associated primary chords.  The posterior leaflet split in the midline.  The mitral annulus was sized to accept a 29 mm prosthesis.  The left ventricle was irrigated with copious cold saline solution.  Mitral valve replacement was performed using interrupted horizontal mattress 2-0 Ethibond pledgeted sutures with pledgets in the supraannular position.  The remaining portions of the anterior leaflet were incorporated into the suture line laterally, thereby preserving chords to both the anterior and posterior leaflet.  An [facility] Mitral bovine bioprosthetic tissue valve (size 29 mm, model # 7300TFX, serial # [id number]) was implanted uneventfully. The valve seated appropriately with care to position the commissure posts away from the left ventricular outflow tract.    The atriotomy was closed using a 2-layer closure of running 3-0 Prolene suture after placing a sump drain across the mitral valve to serve as a left ventricular vent.     Aortic Root Replacement:  At this juncture attention is redirected back to the aortic valve. With the mitral prosthesis in place the aortic root is too small to allow satisfactory passage of the 21 mm then a bioprosthetic tissue valve sizer. The aortic root is carefully examined for alternative strategies.  Because of the somewhat asymmetric appearance of the annulus with the mitral prosthesis in place, root enlargement is felt to potentially problematic and root replacement using a stentless porcine aortic root is felt better alternative.  A 21 mm Medtronic freestyle porcine root sizer fits appropriately.  A Medtronic Freestyle porcine aortic root heart valve (size 21 mm, model # 995, serial # [id number]) is prepared for implantation.  The aorta is transected. The left main and the right coronary arteries are each mobilized on separate buttons of aortic tissue. The remainder of the sinuses of Valsalva are excised.  The proximal suture line is performed using simple interrupted 4-0 Ethibond sutures.  The left main and the right coronary arteries are each reimplanted onto the corresponding sinus of Valsalva of the porcine root graft. The distal end of the root graft is trimmed and beveled and the distal anastomosis performed directly to the aorta using running 4-0 Prolene suture.  The single proximal saphenous vein anastomosis is performed directly to the ascending aorta prior to removal of the aortic cross clamp.  One final dose of warm retrograde "hot shot" cardioplegia was administered retrograde through the coronary sinus catheter while all air was evacuated through the aortic root.  The aortic cross clamp was removed after a total cross clamp time of 294 minutes.   Maze Procedure (right atrial lesion set):  The retrograde cardioplegia cannula was removed and the small hole in the right atrium extended a short distance.  The AtriCure Synergy bipolar radiofrequency ablation clamp is utilized to create  a series of linear lesions in the right atrium, each with one limb of the clamp along the endocardial surface and the other along the epicardial surface. The first lesion is placed from the posterior apex of the atriotomy incision and along the lateral wall of the right atrium to reach the lateral aspect of the  superior vena cava. A second lesion is placed in the opposite direction from the posterior apex of the atriotomy incision along the lateral wall to reach the lateral aspect of the inferior vena cava. A third lesion is placed from the midportion of the atriotomy incision extending at a right angle to reach the tip of the right atrial appendage. A fourth lesion is placed from the anterior apex of the atriotomy incision in an anterior and inferior direction to reach the acute margin of the heart. Finally, the cryotherapy probe is utilized to complete the right atrial lesion set by placing the probe along the endocardial surface of the right atrium from the anterior apex of the atriotomy incision to reach the tricuspid annulus at the 2:00 position. The right atriotomy incision is closed with a 2 layer closure of running 4-0 Prolene suture.   Procedure Completion:  Epicardial pacing wires are fixed to the right ventricular outflow tract and to the right atrial appendage. The patient is rewarmed to 37C temperature. The aortic and left ventricular vents are removed.  The patient is weaned and disconnected from cardiopulmonary bypass.  The patient's rhythm at separation from bypass was AV paced.  The patient was weaned from cardioplegic bypass on low dose dopamine and milrinone infusions. Total cardiopulmonary bypass time for the operation was 352 minutes.  Followup transesophageal echocardiogram performed after separation from bypass revealed a well-seated mitral valve prosthesis that was functioning normally and without any sign of perivalvular leak.  The aortic valve was functioning normally with no aortic insufficiency.  Left ventricular function was initially mildly reduced from preoperatively with inferior hypokinesis.  There appeared to be some air within the left ventricle and aortic root which presumably had embolized in the right coronary artery. This cleared rapidly and left ventricular function  normalized.  The aortic and superior vena cava cannula were removed uneventfully. Protamine was administered to reverse the anticoagulation. The femoral venous cannula was removed and manual pressure held on the groin for 30 minutes.  The mediastinum and pleural space were inspected for hemostasis and irrigated with saline solution.   There appeared to be severe coagulopathy. An exhaustive search for sites of mechanical bleeding was performed. This was continued for over 3 hours. The patient was noted to have severe coagulopathy with thrombocytopenia. The patient was transfused platelets, fresh frozen plasma, and cryoprecipitate.  The patient additionally received Novo-7 recombinant factor VII. There appeared to be some bleeding from coronary veins along the left main coronary pedicle. These were controlled directly with hemoclips. There appeared to be some bleeding from the proximal suture line of the aortic root graft near the commissure between the left and non-coronary sinus of Valsalva. Direct repair was felt not feasible without decompression of the heart. Subsequently, a cannula is replaced in the ascending aorta. The patient is heparinized systemically. The venous cannula is placed through the right atrium. Cardiopulmonary bypass was resumed. Several pledgeted sutures are placed to control the leak of the proximal suture line of the aortic root graft. The patient is weaned and separated from cardiopulmonary bypass. The total duration of the second run of cardiopulmonary bypass was 17 minutes, which brought the  grand total duration of cardiopulmonary bypass to 369 minutes for the entire case.  After separation from bypass the heparin was again reversed using protamine.  Bleeding resolves.   After an exhaustive search for any residual sites of mechanical bleeding, the mediastinum and both pleural spaces were drained using 4 chest tubes placed through separate stab incisions inferiorly.  The soft tissues  anterior to the aorta were reapproximated loosely. The sternum is closed with double strength sternal wire. The soft tissues anterior to the sternum were closed in multiple layers and the skin is closed with a running subcuticular skin closure.  The post-bypass portion of the operation was notable for stable rhythm and hemodynamics.   Patient Disposition:  The patient tolerated the procedure well and is transported to the surgical intensive care in stable condition. There are no intraoperative complications. All sponge instrument and needle counts are verified correct at completion of the operation.    Valentina Gu. Roxy Manns MD 07/09/2014 7:49 PM

## 2014-07-24 NOTE — Progress Notes (Signed)
*  PRELIMINARY RESULTS* Echocardiogram Echocardiogram Transesophageal has been performed.  Leavy Cella 07/07/2014, 10:04 AM

## 2014-07-25 ENCOUNTER — Inpatient Hospital Stay (HOSPITAL_COMMUNITY): Payer: Medicare Other

## 2014-07-25 LAB — POCT I-STAT 3, ART BLOOD GAS (G3+)
Acid-base deficit: 2 mmol/L (ref 0.0–2.0)
Acid-base deficit: 5 mmol/L — ABNORMAL HIGH (ref 0.0–2.0)
BICARBONATE: 19.5 meq/L — AB (ref 20.0–24.0)
Bicarbonate: 22.2 mEq/L (ref 20.0–24.0)
O2 Saturation: 97 %
O2 Saturation: 93 %
PH ART: 7.427 (ref 7.350–7.450)
PH ART: 7.395 (ref 7.350–7.450)
TCO2: 23 mmol/L (ref 0–100)
TCO2: 20 mmol/L (ref 0–100)
pCO2 arterial: 33.7 mmHg — ABNORMAL LOW (ref 35.0–45.0)
pCO2 arterial: 31.8 mmHg — ABNORMAL LOW (ref 35.0–45.0)
pO2, Arterial: 85 mmHg (ref 80.0–100.0)
pO2, Arterial: 65 mmHg — ABNORMAL LOW (ref 80.0–100.0)

## 2014-07-25 LAB — PREPARE FRESH FROZEN PLASMA
UNIT DIVISION: 0
Unit division: 0
Unit division: 0
Unit division: 0
Unit division: 0
Unit division: 0

## 2014-07-25 LAB — CBC
HCT: 27 % — ABNORMAL LOW (ref 39.0–52.0)
HEMATOCRIT: 32 % — AB (ref 39.0–52.0)
HEMOGLOBIN: 9.3 g/dL — AB (ref 13.0–17.0)
Hemoglobin: 10.7 g/dL — ABNORMAL LOW (ref 13.0–17.0)
MCH: 31.3 pg (ref 26.0–34.0)
MCH: 31.3 pg (ref 26.0–34.0)
MCHC: 33.4 g/dL (ref 30.0–36.0)
MCHC: 34.4 g/dL (ref 30.0–36.0)
MCV: 93.6 fL (ref 78.0–100.0)
MCV: 90.9 fL (ref 78.0–100.0)
Platelets: 174 10*3/uL (ref 150–400)
Platelets: 188 10*3/uL (ref 150–400)
RBC: 3.42 MIL/uL — AB (ref 4.22–5.81)
RBC: 2.97 MIL/uL — AB (ref 4.22–5.81)
RDW: 21.4 % — ABNORMAL HIGH (ref 11.5–15.5)
RDW: 21.5 % — ABNORMAL HIGH (ref 11.5–15.5)
WBC: 6.9 10*3/uL (ref 4.0–10.5)
WBC: 9.7 10*3/uL (ref 4.0–10.5)

## 2014-07-25 LAB — BLOOD GAS, ARTERIAL
ACID-BASE DEFICIT: 0.9 mmol/L (ref 0.0–2.0)
Bicarbonate: 23.1 mEq/L (ref 20.0–24.0)
Drawn by: 41308
FIO2: 0.5 %
LHR: 12 {breaths}/min
O2 SAT: 97 %
PCO2 ART: 36.9 mmHg (ref 35.0–45.0)
PEEP: 5 cmH2O
Patient temperature: 98.6
TCO2: 24.2 mmol/L (ref 0–100)
VT: 750 mL
pH, Arterial: 7.413 (ref 7.350–7.450)
pO2, Arterial: 85.1 mmHg (ref 80.0–100.0)

## 2014-07-25 LAB — GLUCOSE, CAPILLARY
GLUCOSE-CAPILLARY: 104 mg/dL — AB (ref 70–99)
GLUCOSE-CAPILLARY: 116 mg/dL — AB (ref 70–99)
GLUCOSE-CAPILLARY: 177 mg/dL — AB (ref 70–99)
GLUCOSE-CAPILLARY: 71 mg/dL (ref 70–99)
GLUCOSE-CAPILLARY: 79 mg/dL (ref 70–99)
Glucose-Capillary: 94 mg/dL (ref 70–99)
Glucose-Capillary: 132 mg/dL — ABNORMAL HIGH (ref 70–99)
Glucose-Capillary: 132 mg/dL — ABNORMAL HIGH (ref 70–99)
Glucose-Capillary: 131 mg/dL — ABNORMAL HIGH (ref 70–99)
Glucose-Capillary: 168 mg/dL — ABNORMAL HIGH (ref 70–99)
Glucose-Capillary: 181 mg/dL — ABNORMAL HIGH (ref 70–99)
Glucose-Capillary: 166 mg/dL — ABNORMAL HIGH (ref 70–99)

## 2014-07-25 LAB — POCT I-STAT, CHEM 8
BUN: 35 mg/dL — ABNORMAL HIGH (ref 6–23)
CALCIUM ION: 0.96 mmol/L — AB (ref 1.13–1.30)
Chloride: 107 mEq/L (ref 96–112)
Creatinine, Ser: 2.5 mg/dL — ABNORMAL HIGH (ref 0.50–1.35)
GLUCOSE: 184 mg/dL — AB (ref 70–99)
HCT: 26 % — ABNORMAL LOW (ref 39.0–52.0)
Hemoglobin: 8.8 g/dL — ABNORMAL LOW (ref 13.0–17.0)
Potassium: 5 mEq/L (ref 3.7–5.3)
Sodium: 131 mEq/L — ABNORMAL LOW (ref 137–147)
TCO2: 23 mmol/L (ref 0–100)

## 2014-07-25 LAB — COMPREHENSIVE METABOLIC PANEL
ALT: 26 U/L (ref 0–53)
AST: 135 U/L — ABNORMAL HIGH (ref 0–37)
Albumin: 3.1 g/dL — ABNORMAL LOW (ref 3.5–5.2)
Alkaline Phosphatase: 33 U/L — ABNORMAL LOW (ref 39–117)
Anion gap: 16 — ABNORMAL HIGH (ref 5–15)
BILIRUBIN TOTAL: 1.1 mg/dL (ref 0.3–1.2)
BUN: 27 mg/dL — ABNORMAL HIGH (ref 6–23)
CALCIUM: 7.5 mg/dL — AB (ref 8.4–10.5)
CO2: 22 meq/L (ref 19–32)
Chloride: 99 mEq/L (ref 96–112)
Creatinine, Ser: 1.74 mg/dL — ABNORMAL HIGH (ref 0.50–1.35)
GFR calc Af Amer: 41 mL/min — ABNORMAL LOW (ref >90)
GFR, EST NON AFRICAN AMERICAN: 36 mL/min — AB (ref >90)
GLUCOSE: 184 mg/dL — AB (ref 70–99)
Potassium: 5.2 mEq/L (ref 3.7–5.3)
SODIUM: 137 meq/L (ref 137–147)
Total Protein: 4.7 g/dL — ABNORMAL LOW (ref 6.0–8.3)

## 2014-07-25 LAB — BASIC METABOLIC PANEL
Anion gap: 15 (ref 5–15)
BUN: 25 mg/dL — ABNORMAL HIGH (ref 6–23)
CO2: 23 mEq/L (ref 19–32)
Calcium: 7.8 mg/dL — ABNORMAL LOW (ref 8.4–10.5)
Chloride: 97 mEq/L (ref 96–112)
Creatinine, Ser: 1.55 mg/dL — ABNORMAL HIGH (ref 0.50–1.35)
GFR calc Af Amer: 47 mL/min — ABNORMAL LOW (ref >90)
GFR, EST NON AFRICAN AMERICAN: 41 mL/min — AB (ref >90)
Glucose, Bld: 136 mg/dL — ABNORMAL HIGH (ref 70–99)
POTASSIUM: 5 meq/L (ref 3.7–5.3)
SODIUM: 135 meq/L — AB (ref 137–147)

## 2014-07-25 LAB — PREPARE CRYOPRECIPITATE
Unit division: 0
Unit division: 0
Unit division: 0

## 2014-07-25 LAB — PROTIME-INR
INR: 1.38 (ref 0.00–1.49)
Prothrombin Time: 17 seconds — ABNORMAL HIGH (ref 11.6–15.2)

## 2014-07-25 LAB — APTT: aPTT: 53 seconds — ABNORMAL HIGH (ref 24–37)

## 2014-07-25 LAB — PREPARE PLATELET PHERESIS
UNIT DIVISION: 0
Unit division: 0
Unit division: 0
Unit division: 0
Unit division: 0
Unit division: 0

## 2014-07-25 LAB — CREATININE, SERUM
Creatinine, Ser: 2.21 mg/dL — ABNORMAL HIGH (ref 0.50–1.35)
GFR calc non Af Amer: 27 mL/min — ABNORMAL LOW (ref >90)
GFR, EST AFRICAN AMERICAN: 31 mL/min — AB (ref >90)

## 2014-07-25 LAB — MAGNESIUM
MAGNESIUM: 2.9 mg/dL — AB (ref 1.5–2.5)
Magnesium: 2.8 mg/dL — ABNORMAL HIGH (ref 1.5–2.5)

## 2014-07-25 LAB — FIBRINOGEN: Fibrinogen: 256 mg/dL (ref 204–475)

## 2014-07-25 MED ORDER — SODIUM CHLORIDE 0.9 % IV SOLN: 250.0000 mL | INTRAVENOUS | Status: AC

## 2014-07-25 MED ORDER — INSULIN ASPART 100 UNIT/ML ~~LOC~~ SOLN
0.0000 [IU] | SUBCUTANEOUS | Status: DC
  Administered 2014-07-25: 2 [IU] via SUBCUTANEOUS
  Administered 2014-07-25 (×4): 4 [IU] via SUBCUTANEOUS
  Administered 2014-07-26 – 2014-07-28 (×6): 2 [IU] via SUBCUTANEOUS

## 2014-07-25 MED ORDER — SODIUM CHLORIDE 0.9 % IV SOLN: Freq: Once | INTRAVENOUS | Status: DC

## 2014-07-25 NOTE — Progress Notes (Signed)
Spoke with Dr. Cyndia Bent about Neo running at 165mcg and unable to keep MAP>65, new orders received for Levophed. Will implement & continue to monitor.  Rolla Plate

## 2014-07-25 NOTE — Procedures (Signed)
Extubation Procedure Note  Patient Details:   Name: Justin [name] DOB: Mar 03, 1933 MRN: [id number]   Airway Documentation:     Evaluation  O2 sats: stable throughout Complications: No apparent complications Patient did tolerate procedure well. Bilateral Breath Sounds: Other (Comment);Diminished (coarse) Suctioning: Oral;Airway Yes pt able to vocalize.  Pt extubated at this time per Rapid wean protocol. Pt placed on 4L [place]. VS stable. VC performed prior to extubation of 0.8L, NIF -20cm H2O. Pt able to breathe around deflated cuff. No stridor noted. Pt has adequate cough. IS performed 250-553mLx10. RT will continue to monitor.   Irineo Axon [facility] 07/25/2014, 12:29 PM

## 2014-07-25 NOTE — Progress Notes (Signed)
[name] got to his surgery. Unfortunately, he had bleeding. The bleeding was not from thrombocytopenia. He appeared to have some clotting factor consumption, possibly caused by the bypass machine. He received plasma, red cells, and platelets. The bleeding did stop.  I looked at the labs from yesterday. His protime was elevated. He did get factor VII. His PTT was minimally elevated. It appears that the protime normalized with factor and plasma support.  He is on pressors right now. I suppose this is to be expected after his surgery. He had 2 valves replaced and a coronary artery bypassed.  This morning, his platelet count is 174,000. He did get some platelets last night. His hemoglobin is 10.7. He was transfused with some blood.  His renal function is going on pretty good. His creatinine is 1.55.  He continues on IV antibiotics. I will see what his coagulation studies are today.  I I don't think that he had full-blown DIC yesterday. His d-dimer was up low but his fibrinogen was okay.  We will certainly continue to try to help in any way possible.  I very much appreciate the outstanding care provided by Dr. Ricard Dillon with his surgery. I also am very thankful for the great care that he is getting in the ICU.   I talked to his family last night and tried to explain to them what I thought had happened with the bleeding and the rationale for giving him the plasma and clotting factors, etc.  Lum Keas  Colossians 3:23

## 2014-07-25 NOTE — Progress Notes (Signed)
Patient ID: Justin [name], male   DOB: 07/30/1933, 80 y.o.   MRN: [id number]  SICU Evening Rounds:  Hemodynamics stable with CI 3.1 on dop 3, milrinone 0.3, levophed 11.  Extubated today. Awake and alert. A little confused.  Urine output 20/hr. Creat this am 1.74  CT output low.  Will wean milrinone since cardiac index is good to see if that will help BP and allow weaning of levophed.

## 2014-07-25 NOTE — Progress Notes (Addendum)
1 Day Post-Op Procedure(s) (LRB): ROOT REPLACEMENT WITH BIOPROSTHETIC PORCINE AORTIC ROOT REIMPLANTATION OF LEFT MAIN AND RIGHT CORONARY ARTERIES (N/A) INTRAOPERATIVE TRANSESOPHAGEAL ECHOCARDIOGRAM (N/A) MAZE (N/A) CORONARY ARTERY BYPASS GRAFTING (CABG), on pump, times one, using right greater saphenous vein harvested endoscopically. (N/A) MITRAL VALVE (MV) REPLACEMENT (N/A) Subjective:  Intubated and sedated on Precedex  Objective: Vital signs in last 24 hours: Temp:  [95.5 F (35.3 C)-98.4 F (36.9 C)] 98.4 F (36.9 C) (08/22 0700) Pulse Rate:  [80-101] 88 (08/22 0752) Cardiac Rhythm:  [-] A-V Sequential paced (08/22 0700)  Few ventricular escape beats under the pacer but not much. Resp:  [0-34] 15 (08/22 0752) BP: (84-118)/(42-56) 114/49 mmHg (08/22 0752) SpO2:  [93 %-100 %] 100 % (08/22 0752) Arterial Line BP: (88-133)/(45-59) 121/50 mmHg (08/22 0700) FiO2 (%):  [50 %-60 %] 50 % (08/22 0752) Weight:  [98.8 kg (217 lb 13 oz)] 98.8 kg (217 lb 13 oz) (08/22 0413)  Hemodynamic parameters for last 24 hours: PAP: (33-54)/(18-27) 52/20 mmHg CO:  [4.3 L/min-6.2 L/min] 6.1 L/min CI:  [2.1 L/min/m2-3 L/min/m2] 3 L/min/m2  Intake/Output from previous day: 08/21 0701 - 08/22 0700 In: 15069.7 [I.V.:6733.7; HENID:7824; NG/GT:60; IV Piggyback:1750] Out: 2353 [Urine:2535; Blood:2900; Chest Tube:820] Intake/Output this shift:    General appearance: sedated Neurologic: unable to assess yet Heart: regular rate and rhythm, S1, S2 normal, no murmur, click, rub or gallop Lungs: coarse BS bilat Abdomen: soft. no BS Extremities: edema moderate diffuse Wound: dressing dry  Lab Results:  Recent Labs  08/01/2014 2020 07/23/2014 2022 07/25/14 0300  WBC 6.1  --  6.9  HGB 9.2* 8.8* 10.7*  HCT 26.8* 26.0* 32.0*  PLT 85*  --  174   BMET:  Recent Labs  07/25/2014 0421  07/20/2014 1901 08/01/2014 2022 07/25/14 0300  NA 134*  < > 136* 137 135*  K 4.1  < > 4.6 4.3 5.0  CL 94*  < > 97  --  97   CO2 31  --   --   --  23  GLUCOSE 84  < > 130* 96 136*  BUN 30*  < > 23  --  25*  CREATININE 1.68*  < > 1.40*  --  1.55*  CALCIUM 8.4  --   --   --  7.8*  < > = values in this interval not displayed.  PT/INR:  Recent Labs  07/30/2014 2020  LABPROT 17.4*  INR 1.42   ABG    Component Value Date/Time   PHART 7.413 07/25/2014 0348   HCO3 23.1 07/25/2014 0348   TCO2 24.2 07/25/2014 0348   ACIDBASEDEF 0.9 07/25/2014 0348   O2SAT 97.0 07/25/2014 0348   CBG (last 3)   Recent Labs  07/25/14 0205 07/25/14 0259 07/25/14 0356  GLUCAP 132* 132* 131*   CXR: ok  ECG: AV paced  Assessment/Plan: S/P Procedure(s) (LRB): ROOT REPLACEMENT WITH BIOPROSTHETIC PORCINE AORTIC ROOT REIMPLANTATION OF LEFT MAIN AND RIGHT CORONARY ARTERIES (N/A) INTRAOPERATIVE TRANSESOPHAGEAL ECHOCARDIOGRAM (N/A) MAZE (N/A) CORONARY ARTERY BYPASS GRAFTING (CABG), on pump, times one, using right greater saphenous vein harvested endoscopically. (N/A) MITRAL VALVE (MV) REPLACEMENT (N/A)  He has good cardiac index of 3.3 on milrinone 0.3 and dop 3 but requiring levophed 12 and neo 20 to support BP. Will continue drips for now and wean neo and levophed as BP allows. Hold beta blocker for now due to vasopressor and no underlying rhythm.  Wean Precedex and see how he wakes up. Then decide about extubation today.  Acute on chronic  renal failure with elevated creat preop. He is slightly higher than preop now but urine output ok. Continue observation. Wt is 27 lbs over preop but will hold off on diuresis with need for vasopressors.  Keep chest tubes in.  Continue ampicillin for enterococcal endocarditis. Hold off on gentamycin for now with renal function.  Continue steroids for periop stress coverage with high dose steroids given for a while before surgery.  Pacer dependent at this time. He had a permanent pacer before that was removed preop. He had biatrial MAZE.  Expected acute blood loss anemia: Hgb acceptable.  Observe.   LOS: 19 days    Rashon Rezek K 07/25/2014

## 2014-07-26 ENCOUNTER — Inpatient Hospital Stay (HOSPITAL_COMMUNITY): Payer: Medicare Other

## 2014-07-26 DIAGNOSIS — I442 Atrioventricular block, complete: Secondary | ICD-10-CM

## 2014-07-26 LAB — COMPREHENSIVE METABOLIC PANEL
ALBUMIN: 2.8 g/dL — AB (ref 3.5–5.2)
ALT: 31 U/L (ref 0–53)
ANION GAP: 17 — AB (ref 5–15)
AST: 138 U/L — ABNORMAL HIGH (ref 0–37)
Alkaline Phosphatase: 48 U/L (ref 39–117)
BUN: 37 mg/dL — AB (ref 6–23)
CHLORIDE: 96 meq/L (ref 96–112)
CO2: 20 mEq/L (ref 19–32)
CREATININE: 2.67 mg/dL — AB (ref 0.50–1.35)
Calcium: 7.5 mg/dL — ABNORMAL LOW (ref 8.4–10.5)
GFR calc Af Amer: 25 mL/min — ABNORMAL LOW (ref >90)
GFR, EST NON AFRICAN AMERICAN: 21 mL/min — AB (ref >90)
Glucose, Bld: 131 mg/dL — ABNORMAL HIGH (ref 70–99)
Potassium: 5.2 mEq/L (ref 3.7–5.3)
Sodium: 133 mEq/L — ABNORMAL LOW (ref 137–147)
Total Bilirubin: 0.6 mg/dL (ref 0.3–1.2)
Total Protein: 5.2 g/dL — ABNORMAL LOW (ref 6.0–8.3)

## 2014-07-26 LAB — CBC
HEMATOCRIT: 29.1 % — AB (ref 39.0–52.0)
Hemoglobin: 9.8 g/dL — ABNORMAL LOW (ref 13.0–17.0)
MCH: 31.7 pg (ref 26.0–34.0)
MCHC: 33.7 g/dL (ref 30.0–36.0)
MCV: 94.2 fL (ref 78.0–100.0)
Platelets: ADEQUATE 10*3/uL (ref 150–400)
RBC: 3.09 MIL/uL — ABNORMAL LOW (ref 4.22–5.81)
RDW: 21.7 % — AB (ref 11.5–15.5)
WBC: 11.4 10*3/uL — AB (ref 4.0–10.5)

## 2014-07-26 LAB — BASIC METABOLIC PANEL
ANION GAP: 14 (ref 5–15)
BUN: 38 mg/dL — ABNORMAL HIGH (ref 6–23)
CALCIUM: 6.1 mg/dL — AB (ref 8.4–10.5)
CO2: 17 mEq/L — ABNORMAL LOW (ref 19–32)
Chloride: 103 mEq/L (ref 96–112)
Creatinine, Ser: 2.69 mg/dL — ABNORMAL HIGH (ref 0.50–1.35)
GFR calc Af Amer: 24 mL/min — ABNORMAL LOW (ref >90)
GFR, EST NON AFRICAN AMERICAN: 21 mL/min — AB (ref >90)
Glucose, Bld: 94 mg/dL (ref 70–99)
Potassium: 4.6 mEq/L (ref 3.7–5.3)
Sodium: 134 mEq/L — ABNORMAL LOW (ref 137–147)

## 2014-07-26 LAB — GLUCOSE, CAPILLARY
Glucose-Capillary: 110 mg/dL — ABNORMAL HIGH (ref 70–99)
Glucose-Capillary: 124 mg/dL — ABNORMAL HIGH (ref 70–99)
Glucose-Capillary: 125 mg/dL — ABNORMAL HIGH (ref 70–99)
Glucose-Capillary: 95 mg/dL (ref 70–99)
Glucose-Capillary: 99 mg/dL (ref 70–99)

## 2014-07-26 MED ORDER — DEXTROSE 5 % IV SOLN
2.0000 g | Freq: Two times a day (BID) | INTRAVENOUS | Status: AC
  Administered 2014-07-26 – 2014-08-20 (×52): 2 g via INTRAVENOUS
  Filled 2014-07-26 (×53): qty 2

## 2014-07-26 MED ORDER — FUROSEMIDE 10 MG/ML IJ SOLN
80.0000 mg | Freq: Once | INTRAMUSCULAR | Status: AC
  Administered 2014-07-26: 80 mg via INTRAVENOUS
  Filled 2014-07-26: qty 8

## 2014-07-26 NOTE — Progress Notes (Signed)
COURTESY NOTE: Patient's platelet count this AM reads "clumped." This is an artifact and of no clinical consequence. I had the count manually reviewed. It is 214 K this AM.  Please let us know if I can be of further help. Dr Marin Olp will follow tomorrow.

## 2014-07-26 NOTE — Progress Notes (Addendum)
Patient Name: Justin [name]      SUBJECTIVE s/p AVR/MVR/CABG with aortic root replacement with Maze for enterococcal bacteremia Also s/p pacemaker extraction Post op course complicated by CHB and renal insuffciciency  Denies chest pain or shortness of breath   Past Medical History  Diagnosis Date  . CAD (coronary artery disease) prior stenting 1999   . Dyslipidemia   . HTN (hypertension)   . WPW (Wolff-Parkinson-White syndrome) loss of preexcitation   . Atrial fibrillation   . AV block, Mobitz II   . Presyncope   . Myocardial infarction   . Pacemaker 04/07/2013  . Arthritis   . History of chicken pox   . Anemia   . Hypernatremia 06/03/2013  . Thrombocytopenia, unspecified 06/03/2013  . Leukopenia 06/03/2013  . Obstructive sleep apnea 06/03/2013    USES CPAP  . Urinary incontinence 06/03/2013  . Hyperglycemia 12/21/2013  . Pancytopenia 06/03/2013  . Iron deficiency anemia, unspecified 07/02/2014  . Malabsorption of iron 07/02/2014  . Hypotestosteronemia 07/02/2014  . ITP (idiopathic thrombocytopenic purpura) 07/02/2014    possible - although patient had bacterial endocarditis at the time of diagnosis  . Aortic insufficiency   . Chronic kidney disease   . Bacterial endocarditis      Enterococcus sepsis with aortic valve vegetation  . UTI (urinary tract infection) 07/13/2014    ENTEROBACTER CLOACAE   . S/P aortic valve and mitral valve replacement 07/31/2014    21 mm Medtronic Freestyle porcine aortic root graft 29 mm [facility] Mitral bovine bioprosthetic mitral valve   . S/P aortic valve replacement with stentless valve 07/22/2014    21 mm Medtronic Freestyle porcine aortic root graft with reimplantation of left main and right coronary arteries  . S/P mitral valve replacement with bioprosthetic valve 07/23/2014    29 mm [facility] Mitral bovine bioprosthetic tissue valve  . S/P CABG x 1 07/11/2014    SVG to OM1 with EVH via right thigh  . S/P Maze operation for atrial  fibrillation 07/06/2014    Complete bilateral atrial lesion set using cryothermy and bipolar radiofrequency ablation with clipping of LA appendage    Scheduled Meds:  Scheduled Meds: . sodium chloride   Intravenous Once  . acetaminophen  1,000 mg Oral 4 times per day   Or  . acetaminophen (TYLENOL) oral liquid 160 mg/5 mL  1,000 mg Per Tube 4 times per day  . ampicillin (OMNIPEN) IV  2 g Intravenous 4 times per day  . antiseptic oral rinse  7 mL Mouth Rinse QID  . aspirin EC  325 mg Oral Daily   Or  . aspirin  324 mg Per Tube Daily  . bisacodyl  10 mg Oral Daily   Or  . bisacodyl  10 mg Rectal Daily  . cefUROXime (ZINACEF)  IV  1.5 g Intravenous Q12H  . chlorhexidine  15 mL Mouth Rinse BID  . docusate sodium  200 mg Oral Daily  . hydrocortisone sod succinate (SOLU-CORTEF) inj  50 mg Intravenous Q8H  . insulin aspart  0-24 Units Subcutaneous 6 times per day  . pantoprazole  40 mg Oral Daily  . sodium chloride  3 mL Intravenous Q12H   Continuous Infusions: . sodium chloride 20 mL/hr at 07/26/14 0600  . sodium chloride 10 mL/hr at 07/25/14 1943  . DOPamine 3 mcg/kg/min (07/26/14 0600)  . lactated ringers 20 mL/hr at 07/26/14 0600  . nitroGLYCERIN 0 mcg/min (07/08/2014 2015)  . norepinephrine (LEVOPHED) Adult infusion  2 mcg/min (07/26/14 0630)   morphine injection, ondansetron (ZOFRAN) IV, oxyCODONE, sodium chloride    PHYSICAL EXAM Filed Vitals:   07/26/14 0600 07/26/14 0615 07/26/14 0630 07/26/14 0645  BP: 106/59     Pulse: 88 88 88 89  Temp: 97.3 F (36.3 C) 97.5 F (36.4 C) 97.5 F (36.4 C) 97.3 F (36.3 C)  TempSrc:      Resp: 18 25 19 16   Height:      Weight:      SpO2: 100% 99% 100% 100%   Well developed and nourished in no acute distress HENT normal Neck supple  Multiple lines in place Clear laterally Regular rate and rhythm, no murmurs or gallops Abd-soft with active BS No Clubbing cyanosis edema Skin-warm and dry A & Oriented  Grossly normal sensory  and motor function   TELEMETRY: Reviewed telemetry pt in vpacing No evidence of underlying atrial activity NO UNDERLYING CONDUCTION OR VENTRICULAR ESCAPE   Intake/Output Summary (Last 24 hours) at 07/26/14 0827 Last data filed at 07/26/14 0600  Gross per 24 hour  Intake 3005.7 ml  Output   1730 ml  Net 1275.7 ml    LABS: Basic Metabolic Panel:  Recent Labs Lab 07/21/14 0430 07/22/14 0445 07/23/14 0530 07/27/2014 0421  07/23/2014 1813 07/23/2014 1826 07/25/2014 1901 08/01/2014 2022 07/25/14 0300 07/25/14 0915 07/25/14 1700 07/25/14 1816 07/26/14 0353  NA 138 140 140 134*  < > 135* 136* 136* 137 135* 137  --  131* 133*  K 4.0 4.2 4.0 4.1  < > 4.5 4.9 4.6 4.3 5.0 5.2  --  5.0 5.2  CL 96 99 97 94*  < > 100 100 97  --  97 99  --  107 96  CO2 32 33* 32 31  --   --   --   --   --  23 22  --   --  20  GLUCOSE 96 90 76 84  < > 137* 122* 130* 96 136* 184*  --  184* 131*  BUN 28* 34* 33* 30*  < > 23 23 23   --  25* 27*  --  35* 37*  CREATININE 1.55* 1.84* 1.70* 1.68*  < > 1.30 1.30 1.40*  --  1.55* 1.74* 2.21* 2.50* 2.67*  CALCIUM 8.7 8.3* 8.6 8.4  --   --   --   --   --  7.8* 7.5*  --   --  7.5*  MG  --   --   --   --   --   --   --   --   --  2.9*  --  2.8*  --   --   < > = values in this interval not displayed. Cardiac Enzymes: No results found for this basename: CKTOTAL, CKMB, CKMBINDEX, TROPONINI,  in the last 72 hours CBC:  Recent Labs Lab 07/23/2014 1607  07/31/2014 1743  07/11/2014 1921 07/08/2014 2020 07/23/2014 2022 07/25/14 0300 07/25/14 1700 07/25/14 1816 07/26/14 0353  WBC 7.3  --  5.1  --  5.4 6.1  --  6.9 9.7  --  11.4*  HGB 9.0*  < > 7.1*  < > 10.1* 9.2* 8.8* 10.7* 9.3* 8.8* 9.8*  HCT 28.3*  < > 21.6*  < > 29.8* 26.8* 26.0* 32.0* 27.0* 26.0* 29.1*  MCV 101.4*  --  97.3  --  95.2 93.1  --  93.6 90.9  --  94.2  PLT 77*  --  157  --  89* 85*  --  174 188  --  PLATELET CLUMPS NOTED ON SMEAR, COUNT APPEARS ADEQUATE  < > = values in this interval not  displayed. PROTIME:  Recent Labs  07/29/2014 1921 07/23/2014 2020 07/25/14 0915  LABPROT 16.8* 17.4* 17.0*  INR 1.36 1.42 1.38   Liver Function Tests:  Recent Labs  07/25/14 0915 07/26/14 0353  AST 135* 138*  ALT 26 31  ALKPHOS 33* 48  BILITOT 1.1 0.6  PROT 4.7* 5.2*  ALBUMIN 3.1* 2.8*   No results found for this basename: LIPASE, AMYLASE,  in the last 72 hours BNP: BNP (last 3 results)  Recent Labs  07/04/2014 1530 07/21/14 0430  PROBNP 12303.0* 9533.0*   D-Dimer:  Recent Labs  07/15/2014 1605  DDIMER 1.26*      ASSESSMENT AND PLAN:  Complete Heart Block S/p Aortic and mitral valve replacement S/p Maze [facility]  Will observe for now   Will almost certainly need pacing but will hold off  Will regroup with Dr CO in am re timing   Signed, Virl Axe MD  07/26/2014

## 2014-07-26 NOTE — Progress Notes (Addendum)
2 Days Post-Op Procedure(s) (LRB): ROOT REPLACEMENT WITH BIOPROSTHETIC PORCINE AORTIC ROOT REIMPLANTATION OF LEFT MAIN AND RIGHT CORONARY ARTERIES (N/A) INTRAOPERATIVE TRANSESOPHAGEAL ECHOCARDIOGRAM (N/A) MAZE (N/A) CORONARY ARTERY BYPASS GRAFTING (CABG), on pump, times one, using right greater saphenous vein harvested endoscopically. (N/A) MITRAL VALVE (MV) REPLACEMENT (N/A) Subjective: No complaints  Objective: Vital signs in last 24 hours: Temp:  [97.3 F (36.3 C)-99.2 F (37.3 C)] 97.3 F (36.3 C) (08/23 0645) Pulse Rate:  [87-90] 89 (08/23 0645) Cardiac Rhythm:  [-] A-V Sequential paced (08/23 0600) Resp:  [0-31] 16 (08/23 0645) BP: (97-121)/(40-59) 106/59 mmHg (08/23 0600) SpO2:  [96 %-100 %] 100 % (08/23 0645) Arterial Line BP: (102-147)/(46-66) 103/53 mmHg (08/23 0645) FiO2 (%):  [40 %] 40 % (08/22 1142) Weight:  [95.7 kg (210 lb 15.7 oz)] 95.7 kg (210 lb 15.7 oz) (08/23 0359)  Hemodynamic parameters for last 24 hours: PAP: (26-70)/(18-31) 55/26 mmHg CO:  [4.9 L/min-6.2 L/min] 4.9 L/min CI:  [2.4 L/min/m2-3 L/min/m2] 2.4 L/min/m2  Intake/Output from previous day: 08/22 0701 - 08/23 0700 In: 3256.2 [I.V.:2931.2; NG/GT:25; IV Piggyback:300] Out: 1850 [Urine:470; Chest Tube:1380] Intake/Output this shift:    General appearance: alert, cooperative and a little confused Neurologic: intact Heart: regular rate and rhythm, S1, S2 normal, no murmur, click, rub or gallop Lungs: clear to auscultation bilaterally Abdomen: soft, non-tender; few bowel sounds; no masses,  no organomegaly Extremities: edema diffuse  Wound: dressings dry  Lab Results:  Recent Labs  07/25/14 1700 07/25/14 1816 07/26/14 0353  WBC 9.7  --  11.4*  HGB 9.3* 8.8* 9.8*  HCT 27.0* 26.0* 29.1*  PLT 188  --  PLATELET CLUMPS NOTED ON SMEAR, COUNT APPEARS ADEQUATE   BMET:  Recent Labs  07/25/14 0915  07/25/14 1816 07/26/14 0353  NA 137  --  131* 133*  [name] 5.2  --  5.0 5.2  CL 99  --  107 96   CO2 22  --   --  20  GLUCOSE 184*  --  184* 131*  BUN 27*  --  35* 37*  CREATININE 1.74*  < > 2.50* 2.67*  CALCIUM 7.5*  --   --  7.5*  < > = values in this interval not displayed.  PT/INR:  Recent Labs  07/25/14 0915  LABPROT 17.0*  INR 1.38   ABG    Component Value Date/Time   PHART 7.395 07/25/2014 2059   HCO3 19.5* 07/25/2014 2059   TCO2 20 07/25/2014 2059   ACIDBASEDEF 5.0* 07/25/2014 2059   O2SAT 93.0 07/25/2014 2059   CBG (last 3)   Recent Labs  07/25/14 1646 07/25/14 2010 07/26/14 0011  GLUCAP 181* 166* 125*   CLINICAL DATA: Status post cardiac procedures  EXAM:  PORTABLE CHEST - 1 VIEW  COMPARISON: Portable chest x-ray of July 25, 2014  FINDINGS:  The right lung is adequately inflated. There is no pneumothorax or  pleural effusion. The interstitial markings have improved. The chest  tube tip on the right is unchanged in the lateral costophrenic  gutter.  The left lung is well-expanded. The interstitial markings remain  increased but have improved. There is no pneumothorax. The left  upper chest tube is unchanged in position. A chest tube at the level  of the left hemidiaphragm also appears stable. An additional tube to  the left of the thoracic spine is demonstrated with its tip at the  level of the AP window.  The cardiac silhouette is top-normal in size. A left atrial  appendage clip is  present. A mitral valve ring is demonstrated.  There is a Swan-Ganz catheter whose tip lies in the proximal right  descending pulmonary artery. There are coronary the artery bypass  rings present.  IMPRESSION:  There has been mild interval improvement in the appearance of the  pulmonary interstitium especially on the right. The support tubes  and lines are in reasonable position.  Electronically Signed  By: David Martinique  On: 07/26/2014 07:50   Assessment/Plan: S/P Procedure(s) (LRB): ROOT REPLACEMENT WITH BIOPROSTHETIC PORCINE AORTIC ROOT REIMPLANTATION OF LEFT  MAIN AND RIGHT CORONARY ARTERIES (N/A) INTRAOPERATIVE TRANSESOPHAGEAL ECHOCARDIOGRAM (N/A) MAZE (N/A) CORONARY ARTERY BYPASS GRAFTING (CABG), on pump, times one, using right greater saphenous vein harvested endoscopically. (N/A) MITRAL VALVE (MV) REPLACEMENT (N/A) Hemodynamically stable off levophed and milrinone but still on low dose dopamine.   Acute on chronic renal failure: continue low dose dopamine and observe. He received 80 mg lasix last night for no urine output and is currently making 15-20 cc per hour. Will hold off on further diuresis and let renal function recover.  Volume excess: wait on diuresis. He is tolerating the volume excess well at this time.  Pacer dependent: atrial wires not working. He had PPM preop. Will probably need a PPM once he recovers a little.  Chest tube output still significant but serosanguinous and thin. Likely due to volume excess. Will keep chest tubes in for now.  Diabetes: glucose under adequate control on hydrocortisone for possible adrenal suppression from preop steroids. Continue SSI.  Malnutrition with albumin 2.8: not taking much po yet.   LOS: 20 days    Justin [name] 07/26/2014

## 2014-07-26 NOTE — Significant Event (Signed)
CRITICAL VALUE ALERT  Critical value received:  Calcium  Date of notification:  05/26/14  Time of notification:  1805  Critical value read back:Yes.    Nurse who received alert:  Sula Soda, RN  MD notified (1st page):  Bartle  Time of first page:  1808  Responding MD:  Dr. Cyndia Bent  Time MD responded:  [phone]

## 2014-07-26 NOTE — Progress Notes (Signed)
ANTIBIOTIC CONSULT NOTE - FOLLOW UP  Pharmacy Consult for Ampicillin / Ceftriaxone Indication: endocarditis  No Known Allergies  Patient Measurements: Height: 5\' 11"  (180.3 cm) Weight: 210 lb 15.7 oz (95.7 kg) IBW/kg (Calculated) : 75.3  Vital Signs: Temp: 97.3 F (36.3 C) (08/23 1000) Temp src: Core (Comment) (08/23 0800) BP: 100/59 mmHg (08/23 1000) Pulse Rate: 90 (08/23 1000) Intake/Output from previous day: 08/22 0701 - 08/23 0700 In: 3330.4 [I.V.:3005.4; NG/GT:25; IV Piggyback:300] Out: 1895 [Urine:485; Chest Tube:1410] Intake/Output from this shift: Total I/O In: 198.2 [I.V.:198.2] Out: 15 [Urine:15]  Labs:  Recent Labs  07/25/14 0300  07/25/14 1700 07/25/14 1816 07/26/14 0353  WBC 6.9  --  9.7  --  11.4*  HGB 10.7*  --  9.3* 8.8* 9.8*  PLT 174  --  188  --  PLATELET CLUMPS NOTED ON SMEAR, COUNT APPEARS ADEQUATE  CREATININE 1.55*  < > 2.21* 2.50* 2.67*  < > = values in this interval not displayed. Estimated Creatinine Clearance: 26.5 ml/min (by C-G formula based on Cr of 2.67). 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  S/P OHS 8/21: Aortic root replacement porcine aortic root/valve, Pericardial tissue MVR, CABG x 1, Maze.  Post op ampicillin has been continued but gentamicin has been held d/t poor renal function - Scr increase 1.5> 2.6.  Discussed with ID pharmacist for alternative therapy for gentamicin - recommendation was high dose ceftriaxone 2gm IV q12 - this is not renally eliminated.  Discussed with and approved by Dr Cyndia Bent.    LVQ 8/3 >>8/5 Vanc 8/3 >>8/8 Zosyn 8/3 >> 8/5 Ampicillin 8/8>>  Gentamicin 8/9>>8/20   8/3 Bld >> 2/2 Enterococcus (sens amp) 8/3 Urine >> 35K  enterobacter (resis ancef) 8/5 Bld>> Neg    Plan:  1) add ceftriaxone 2gm IV q12 2) Continue ampicillin 2g IV q6  Bonnita Nasuti Pharm.D. CPP, BCPS Clinical Pharmacist [phone] 07/26/2014 11:52 AM

## 2014-07-26 NOTE — Progress Notes (Signed)
Telephoned Dr. Cyndia Bent regarding low UOP less than 20cc over 2 hours. Updated on CT output, BP, PAP, gtts and CO. New orders received for 80mg  Lasix. Orders to keep MAP>60.  Will continue to monitor.  Rolla Plate

## 2014-07-27 ENCOUNTER — Inpatient Hospital Stay (HOSPITAL_COMMUNITY): Payer: Medicare Other

## 2014-07-27 ENCOUNTER — Encounter (HOSPITAL_COMMUNITY): Payer: Self-pay | Admitting: Thoracic Surgery (Cardiothoracic Vascular Surgery)

## 2014-07-27 DIAGNOSIS — N183 Chronic kidney disease, stage 3 unspecified: Secondary | ICD-10-CM

## 2014-07-27 DIAGNOSIS — Z954 Presence of other heart-valve replacement: Secondary | ICD-10-CM

## 2014-07-27 DIAGNOSIS — I5033 Acute on chronic diastolic (congestive) heart failure: Secondary | ICD-10-CM

## 2014-07-27 DIAGNOSIS — N179 Acute kidney failure, unspecified: Secondary | ICD-10-CM

## 2014-07-27 HISTORY — DX: Acute on chronic diastolic (congestive) heart failure: I50.33

## 2014-07-27 HISTORY — DX: Chronic kidney disease, stage 3 unspecified: N18.30

## 2014-07-27 HISTORY — DX: Acute kidney failure, unspecified: N17.9

## 2014-07-27 LAB — TYPE AND SCREEN
ABO/RH(D): A NEG
ANTIBODY SCREEN: NEGATIVE
UNIT DIVISION: 0
UNIT DIVISION: 0
UNIT DIVISION: 0
UNIT DIVISION: 0
UNIT DIVISION: 0
Unit division: 0
Unit division: 0
Unit division: 0
Unit division: 0
Unit division: 0
Unit division: 0
Unit division: 0

## 2014-07-27 LAB — URINALYSIS, ROUTINE W REFLEX MICROSCOPIC
Bilirubin Urine: NEGATIVE
Glucose, UA: NEGATIVE mg/dL
Ketones, ur: 15 mg/dL — AB
Nitrite: NEGATIVE
PROTEIN: 30 mg/dL — AB
Specific Gravity, Urine: 1.024 (ref 1.005–1.030)
Urobilinogen, UA: 0.2 mg/dL (ref 0.0–1.0)
pH: 5 (ref 5.0–8.0)

## 2014-07-27 LAB — COMPREHENSIVE METABOLIC PANEL
ALT: 36 U/L (ref 0–53)
AST: 109 U/L — ABNORMAL HIGH (ref 0–37)
Albumin: 2.6 g/dL — ABNORMAL LOW (ref 3.5–5.2)
Alkaline Phosphatase: 69 U/L (ref 39–117)
Anion gap: 19 — ABNORMAL HIGH (ref 5–15)
BUN: 50 mg/dL — ABNORMAL HIGH (ref 6–23)
CALCIUM: 7.7 mg/dL — AB (ref 8.4–10.5)
CO2: 18 meq/L — AB (ref 19–32)
Chloride: 94 mEq/L — ABNORMAL LOW (ref 96–112)
Creatinine, Ser: 3.68 mg/dL — ABNORMAL HIGH (ref 0.50–1.35)
GFR calc non Af Amer: 14 mL/min — ABNORMAL LOW (ref >90)
GFR, EST AFRICAN AMERICAN: 17 mL/min — AB (ref >90)
GLUCOSE: 124 mg/dL — AB (ref 70–99)
Potassium: 5.8 mEq/L — ABNORMAL HIGH (ref 3.7–5.3)
Sodium: 131 mEq/L — ABNORMAL LOW (ref 137–147)
Total Bilirubin: 0.4 mg/dL (ref 0.3–1.2)
Total Protein: 5.7 g/dL — ABNORMAL LOW (ref 6.0–8.3)

## 2014-07-27 LAB — BASIC METABOLIC PANEL
ANION GAP: 19 — AB (ref 5–15)
Anion gap: 18 — ABNORMAL HIGH (ref 5–15)
BUN: 58 mg/dL — ABNORMAL HIGH (ref 6–23)
BUN: 59 mg/dL — AB (ref 6–23)
CO2: 21 meq/L (ref 19–32)
CO2: 21 mEq/L (ref 19–32)
CREATININE: 3.76 mg/dL — AB (ref 0.50–1.35)
CREATININE: 4.15 mg/dL — AB (ref 0.50–1.35)
Calcium: 7.6 mg/dL — ABNORMAL LOW (ref 8.4–10.5)
Calcium: 7.6 mg/dL — ABNORMAL LOW (ref 8.4–10.5)
Chloride: 96 mEq/L (ref 96–112)
Chloride: 95 mEq/L — ABNORMAL LOW (ref 96–112)
GFR calc Af Amer: 16 mL/min — ABNORMAL LOW (ref >90)
GFR calc non Af Amer: 14 mL/min — ABNORMAL LOW (ref >90)
GFR calc non Af Amer: 12 mL/min — ABNORMAL LOW (ref >90)
GFR, EST AFRICAN AMERICAN: 14 mL/min — AB (ref >90)
Glucose, Bld: 83 mg/dL (ref 70–99)
Glucose, Bld: 109 mg/dL — ABNORMAL HIGH (ref 70–99)
Potassium: 5.9 mEq/L — ABNORMAL HIGH (ref 3.7–5.3)
Potassium: 5.1 mEq/L (ref 3.7–5.3)
Sodium: 135 mEq/L — ABNORMAL LOW (ref 137–147)
Sodium: 135 mEq/L — ABNORMAL LOW (ref 137–147)

## 2014-07-27 LAB — GLUCOSE, CAPILLARY
Glucose-Capillary: 113 mg/dL — ABNORMAL HIGH (ref 70–99)
Glucose-Capillary: 114 mg/dL — ABNORMAL HIGH (ref 70–99)
Glucose-Capillary: 115 mg/dL — ABNORMAL HIGH (ref 70–99)
Glucose-Capillary: 121 mg/dL — ABNORMAL HIGH (ref 70–99)
Glucose-Capillary: 129 mg/dL — ABNORMAL HIGH (ref 70–99)
Glucose-Capillary: 159 mg/dL — ABNORMAL HIGH (ref 70–99)
Glucose-Capillary: 78 mg/dL (ref 70–99)
Glucose-Capillary: 93 mg/dL (ref 70–99)

## 2014-07-27 LAB — PREPARE FRESH FROZEN PLASMA
UNIT DIVISION: 0
Unit division: 0
Unit division: 0
Unit division: 0

## 2014-07-27 LAB — URINE MICROSCOPIC-ADD ON

## 2014-07-27 LAB — CBC
HEMATOCRIT: 29.6 % — AB (ref 39.0–52.0)
HEMOGLOBIN: 9.8 g/dL — AB (ref 13.0–17.0)
MCH: 32 pg (ref 26.0–34.0)
MCHC: 33.1 g/dL (ref 30.0–36.0)
MCV: 96.7 fL (ref 78.0–100.0)
Platelets: ADEQUATE 10*3/uL (ref 150–400)
RBC: 3.06 MIL/uL — ABNORMAL LOW (ref 4.22–5.81)
RDW: 21.6 % — ABNORMAL HIGH (ref 11.5–15.5)
WBC: 10.5 10*3/uL (ref 4.0–10.5)

## 2014-07-27 LAB — CREATININE, URINE, RANDOM: CREATININE, URINE: 60.55 mg/dL

## 2014-07-27 MED ORDER — PREDNISONE 10 MG PO TABS
10.0000 mg | ORAL_TABLET | Freq: Two times a day (BID) | ORAL | Status: DC
  Administered 2014-07-27 (×2): 10 mg via ORAL
  Filled 2014-07-27 (×6): qty 1

## 2014-07-27 MED ORDER — VECURONIUM BROMIDE 10 MG IV SOLR: 8.0000 mg | Freq: Once | INTRAVENOUS | Status: DC

## 2014-07-27 MED ORDER — FUROSEMIDE 10 MG/ML IJ SOLN
160.0000 mg | Freq: Once | INTRAVENOUS | Status: AC
  Administered 2014-07-27: 160 mg via INTRAVENOUS
  Filled 2014-07-27: qty 16

## 2014-07-27 MED ORDER — OXYCODONE HCL 5 MG PO TABS: 5.0000 mg | ORAL_TABLET | ORAL | Status: DC | PRN

## 2014-07-27 MED ORDER — FENTANYL CITRATE 0.05 MG/ML IJ SOLN
100.0000 ug | Freq: Once | INTRAMUSCULAR | Status: DC
Start: 2014-07-27 — End: 2014-07-27

## 2014-07-27 MED ORDER — FUROSEMIDE 10 MG/ML IJ SOLN
120.0000 mg | Freq: Once | INTRAVENOUS | Status: AC
  Administered 2014-07-27: 120 mg via INTRAVENOUS
  Filled 2014-07-27: qty 12

## 2014-07-27 MED ORDER — SODIUM POLYSTYRENE SULFONATE 15 GM/60ML PO SUSP
45.0000 g | Freq: Once | ORAL | Status: AC
  Administered 2014-07-27: 45 g via ORAL
  Filled 2014-07-27: qty 180

## 2014-07-27 MED ORDER — DARBEPOETIN ALFA-POLYSORBATE 100 MCG/0.5ML IJ SOLN
100.0000 ug | Freq: Once | INTRAMUSCULAR | Status: AC
  Administered 2014-07-27: 100 ug via SUBCUTANEOUS
  Filled 2014-07-27 (×2): qty 0.5

## 2014-07-27 MED ORDER — SODIUM CHLORIDE 0.9 % IV SOLN
2.0000 g | Freq: Three times a day (TID) | INTRAVENOUS | Status: AC
  Administered 2014-07-27 – 2014-08-20 (×74): 2 g via INTRAVENOUS
  Filled 2014-07-27 (×77): qty 2000

## 2014-07-27 MED ORDER — MORPHINE SULFATE 2 MG/ML IJ SOLN: 2.0000 mg | INTRAMUSCULAR | Status: DC | PRN

## 2014-07-27 MED ORDER — PANTOPRAZOLE SODIUM 40 MG PO TBEC
40.0000 mg | DELAYED_RELEASE_TABLET | Freq: Two times a day (BID) | ORAL | Status: DC
  Administered 2014-07-27 – 2014-07-31 (×10): 40 mg via ORAL
  Filled 2014-07-27 (×10): qty 1

## 2014-07-27 MED ORDER — SODIUM CHLORIDE 0.9 % IV SOLN
INTRAVENOUS | Status: DC
  Administered 2014-07-28 (×2): via INTRAVENOUS
  Administered 2014-07-30: 1 mL via INTRAVENOUS
  Administered 2014-08-06: 17:00:00 via INTRAVENOUS

## 2014-07-27 MED ORDER — ASPIRIN EC 81 MG PO TBEC
81.0000 mg | DELAYED_RELEASE_TABLET | Freq: Every day | ORAL | Status: DC
  Administered 2014-07-27 – 2014-08-30 (×35): 81 mg via ORAL
  Filled 2014-07-27 (×36): qty 1

## 2014-07-27 MED ORDER — MIDAZOLAM HCL 2 MG/2ML IJ SOLN: 2.0000 mg | Freq: Once | INTRAMUSCULAR | Status: DC

## 2014-07-27 MED FILL — Lidocaine HCl IV Inj 20 MG/ML: INTRAVENOUS | Qty: 5 | Status: AC

## 2014-07-27 MED FILL — Heparin Sodium (Porcine) Inj 1000 Unit/ML: INTRAMUSCULAR | Qty: 10 | Status: AC

## 2014-07-27 MED FILL — Heparin Sodium (Porcine) Inj 1000 Unit/ML: INTRAMUSCULAR | Qty: 90 | Status: AC

## 2014-07-27 MED FILL — Sodium Chloride IV Soln 0.9%: INTRAVENOUS | Qty: 5000 | Status: AC

## 2014-07-27 MED FILL — Electrolyte-R (PH 7.4) Solution: INTRAVENOUS | Qty: 8000 | Status: AC

## 2014-07-27 MED FILL — Sodium Bicarbonate IV Soln 8.4%: INTRAVENOUS | Qty: 50 | Status: AC

## 2014-07-27 MED FILL — Mannitol IV Soln 20%: INTRAVENOUS | Qty: 500 | Status: AC

## 2014-07-27 MED FILL — Electrolyte-R (PH 7.4) Solution: INTRAVENOUS | Qty: 3000 | Status: AC

## 2014-07-27 MED FILL — Electrolyte-R (PH 7.4) Solution: INTRAVENOUS | Qty: 5000 | Status: AC

## 2014-07-27 NOTE — Progress Notes (Signed)
[facility] for Infectious Disease  Date of Admission:  07/11/2014  Antibiotics: Ampicillin ceftriaxone  Subjective: Feels pretty good, was in the chair earlier  Objective: Temp:  [97.5 F (36.4 C)-98 F (36.7 C)] 97.7 F (36.5 C) (08/24 0802) Pulse Rate:  [79-94] 80 (08/24 0800) Resp:  [0-28] 10 (08/24 0900) BP: (82-146)/(53-82) 123/79 mmHg (08/24 0900) SpO2:  [96 %-100 %] 99 % (08/24 0800) Arterial Line BP: (82-173)/(41-107) 165/68 mmHg (08/24 0715) Weight:  [216 lb 14.9 oz (98.4 kg)] 216 lb 14.9 oz (98.4 kg) (08/24 0600)  General: awake, alert, nad Skin: no rashes, sternal incision noted  Lungs: CTA B Cor: RRR Ext: no edema  Lab Results Lab Results  Component Value Date   WBC 10.5 07/27/2014   HGB 9.8* 07/27/2014   HCT 29.6* 07/27/2014   MCV 96.7 07/27/2014   PLT PLATELET CLUMPS NOTED ON SMEAR, COUNT APPEARS ADEQUATE 07/27/2014    Lab Results  Component Value Date   CREATININE 3.68* 07/27/2014   BUN 50* 07/27/2014   NA 131* 07/27/2014   K 5.8* 07/27/2014   CL 94* 07/27/2014   CO2 18* 07/27/2014    Lab Results  Component Value Date   ALT 36 07/27/2014   AST 109* 07/27/2014   ALKPHOS 69 07/27/2014   BILITOT 0.4 07/27/2014      Microbiology: Recent Results (from the past 240 hour(s))  SURGICAL PCR SCREEN     Status: None   Collection Time    07/21/14 11:05 AM      Result Value Ref Range Status   MRSA, PCR NEGATIVE  NEGATIVE Final   Staphylococcus aureus NEGATIVE  NEGATIVE Final   Comment:            The Xpert SA Assay (FDA     approved for NASAL specimens     in patients over 42 years of age),     is one component of     a comprehensive surveillance     program.  Test performance has     been validated by [employer] for patients greater     than or equal to 84 year old.     It is not intended     to diagnose infection nor to     guide or monitor treatment.  GRAM STAIN     Status: None   Collection Time    07/19/2014  9:36 AM      Result Value  Ref Range Status   Specimen Description TISSUE   Final   Special Requests AORTIC VALVE VEGETATION HEART PT ON ZINACEF VANCO   Final   Gram Stain     Final   Value: FEW WBC PRESENT,BOTH PMN AND MONONUCLEAR     ABUNDANT GRAM POSITIVE COCCI IN PAIRS   Report Status 07/23/2014 FINAL   Final  TISSUE CULTURE     Status: None   Collection Time    07/07/2014  9:36 AM      Result Value Ref Range Status   Specimen Description TISSUE OTHER   Final   Special Requests AORTIC VALVE VEGETATION   Final   Gram Stain     Final   Value: NO WBC SEEN     NO SQUAMOUS EPITHELIAL CELLS SEEN     NO ORGANISMS SEEN     Performed at [facility]   Culture     Final   Value: Culture reincubated for better growth     Performed at  [facility]   Report Status PENDING   Incomplete  ANAEROBIC CULTURE     Status: None   Collection Time    08/03/2014  9:36 AM      Result Value Ref Range Status   Specimen Description TISSUE OTHER   Final   Special Requests AORTIC VALVE VEGETATION   Final   Gram Stain PENDING   Incomplete   Culture     Final   Value: NO ANAEROBES ISOLATED; CULTURE IN PROGRESS FOR 5 DAYS     Performed at [facility]   Report Status PENDING   Incomplete    Studies/Results: Dg Chest Port 1 View  07/27/2014   CLINICAL DATA:  Status post heart surgery.  EXAM: PORTABLE CHEST - 1 VIEW  COMPARISON:  07/26/2014  FINDINGS: The Swan-Ganz catheter has been removed. The right IJ Cordis is still in place. The right PICC line is unchanged. Stable bilateral chest tubes without pneumothorax. Persistent but slightly improved areas bilateral atelectasis. No definite pleural effusions.  IMPRESSION: Stable support apparatus.  No pneumothorax.  Slight improved lung aeration with resolving areas of atelectasis.   Electronically Signed   By: Kalman Jewels M.D.   On: 07/27/2014 07:42   Dg Chest Port 1 View  07/26/2014   CLINICAL DATA:  Status post cardiac procedures  EXAM: PORTABLE CHEST - 1 VIEW   COMPARISON:  Portable chest x-ray of July 25, 2014  FINDINGS: The right lung is adequately inflated. There is no pneumothorax or pleural effusion. The interstitial markings have improved. The chest tube tip on the right is unchanged in the lateral costophrenic gutter.  The left lung is well-expanded. The interstitial markings remain increased but have improved. There is no pneumothorax. The left upper chest tube is unchanged in position. A chest tube at the level of the left hemidiaphragm also appears stable. An additional tube to the left of the thoracic spine is demonstrated with its tip at the level of the AP window.  The cardiac silhouette is top-normal in size. A left atrial appendage clip is present. A mitral valve ring is demonstrated. There is a Swan-Ganz catheter whose tip lies in the proximal right descending pulmonary artery. There are coronary the artery bypass rings present.  IMPRESSION: There has been mild interval improvement in the appearance of the pulmonary interstitium especially on the right. The support tubes and lines are in reasonable position.   Electronically Signed   By: David  Martinique   On: 07/26/2014 07:50    Assessment/Plan: 1) Enterococcal endocarditis s/p valve replacement - with increased creat, on dual beta lactam therapy with ampicillin and ceftriaxone.  Gram stain of valve with GPC as expected.    Scharlene Gloss, [facility] [place] [email or web address] [phone] pager   [phone] cell 07/27/2014, 11:17 AM

## 2014-07-27 NOTE — Progress Notes (Signed)
Mr. Mayeda is off the ventilator. He actually looks pretty good. He was pretty alert.  He's had no obvious bleeding.  He still on dopamine.  His renal function is declining. This, probably, is no surprise given the pressor support that he was on over the weekend. His BUN is 50 and creatinine 3.68. His potassium is 5.8.  His hemoglobin is 9.8 which is holding pretty steady. White cell count is 10.5. The lab reports the platelets are "clumped". This would indicate that his platelet count should be adequate. He may be at higher risk of bleeding with renal insufficiency. He is on aspirin. He is on Protonix daily. I probably will get him on twice a day protonix.  He is on hydrocortisone.  He continues on antibiotics.  On his physical exam, his vital signs look real good. Blood pressure is 146/67. Temperature 97.8. Pulse is 80. His lungs show some scattered crackles bilaterally. Cardiac exam regular rate and rhythm. He has a 2/6 systolic murmur. Abdomen is soft. Has good bowel sounds. They may be slightly decreased. There is no palpable liver or spleen tip. Extremities shows some 1+ edema in his lower legs. Skin shows some scattered small ecchymoses. Neurological exam is nonfocal.  He's made a lot of progress since Friday and Saturday. His blood counts are holding on pretty well. I will have to have the lab to run his platelets with a citrated test tube. This will help alleviate platelet clumps.  We will still follow along closely. With his renal insufficiency, we'll have to watch his hemoglobin. He may need a dose of Aranesp. His erythropoietin level is already on the low side.  Pete E.  Romans 8:28

## 2014-07-27 NOTE — Progress Notes (Signed)
SUBJECTIVE:  No acute SOB.     PHYSICAL EXAM Filed Vitals:   07/27/14 0900 07/27/14 1000 07/27/14 1100 07/27/14 1146  BP: 123/79 113/60 125/64   Pulse:  80 81   Temp:    98.4 F (36.9 C)  TempSrc:    Oral  Resp: 10 19 16    Height:      Weight:      SpO2:  100% 100%    General:  No acute distress Lungs:  Decreased breath sounds Heart:  RRR Abdomen:  Decreased bowel sounds3 Extremities:  Moderate edema  LABS:  Results for orders placed during the hospital encounter of 07/04/2014 (from the past 24 hour(s))  GLUCOSE, CAPILLARY     Status: None   Collection Time    07/26/14  4:39 PM      Result Value Ref Range   Glucose-Capillary 99  70 - 99 mg/dL  BASIC METABOLIC PANEL     Status: Abnormal   Collection Time    07/26/14  5:25 PM      Result Value Ref Range   Sodium 134 (*) 137 - 147 mEq/L   Potassium 4.6  3.7 - 5.3 mEq/L   Chloride 103  96 - 112 mEq/L   CO2 17 (*) 19 - 32 mEq/L   Glucose, Bld 94  70 - 99 mg/dL   BUN 38 (*) 6 - 23 mg/dL   Creatinine, Ser 2.69 (*) 0.50 - 1.35 mg/dL   Calcium 6.1 (*) 8.4 - 10.5 mg/dL   GFR calc non Af Amer 21 (*) >90 mL/min   GFR calc Af Amer 24 (*) >90 mL/min   Anion gap 14  5 - 15  GLUCOSE, CAPILLARY     Status: Abnormal   Collection Time    07/26/14  8:15 PM      Result Value Ref Range   Glucose-Capillary 110 (*) 70 - 99 mg/dL   Comment 1 Documented in Chart     Comment 2 Notify RN    GLUCOSE, CAPILLARY     Status: Abnormal   Collection Time    07/26/14 11:35 PM      Result Value Ref Range   Glucose-Capillary 113 (*) 70 - 99 mg/dL   Comment 1 Documented in Chart     Comment 2 Notify RN    COMPREHENSIVE METABOLIC PANEL     Status: Abnormal   Collection Time    07/27/14  4:00 AM      Result Value Ref Range   Sodium 131 (*) 137 - 147 mEq/L   Potassium 5.8 (*) 3.7 - 5.3 mEq/L   Chloride 94 (*) 96 - 112 mEq/L   CO2 18 (*) 19 - 32 mEq/L   Glucose, Bld 124 (*) 70 - 99 mg/dL   BUN 50 (*) 6 - 23 mg/dL   Creatinine, Ser 3.68  (*) 0.50 - 1.35 mg/dL   Calcium 7.7 (*) 8.4 - 10.5 mg/dL   Total Protein 5.7 (*) 6.0 - 8.3 g/dL   Albumin 2.6 (*) 3.5 - 5.2 g/dL   AST 109 (*) 0 - 37 U/L   ALT 36  0 - 53 U/L   Alkaline Phosphatase 69  39 - 117 U/L   Total Bilirubin 0.4  0.3 - 1.2 mg/dL   GFR calc non Af Amer 14 (*) >90 mL/min   GFR calc Af Amer 17 (*) >90 mL/min   Anion gap 19 (*) 5 - 15  CBC     Status: Abnormal  Collection Time    07/27/14  4:00 AM      Result Value Ref Range   WBC 10.5  4.0 - 10.5 K/uL   RBC 3.06 (*) 4.22 - 5.81 MIL/uL   Hemoglobin 9.8 (*) 13.0 - 17.0 g/dL   HCT 29.6 (*) 39.0 - 52.0 %   MCV 96.7  78.0 - 100.0 fL   MCH 32.0  26.0 - 34.0 pg   MCHC 33.1  30.0 - 36.0 g/dL   RDW 21.6 (*) 11.5 - 15.5 %   Platelets    150 - 400 K/uL   Value: PLATELET CLUMPS NOTED ON SMEAR, COUNT APPEARS ADEQUATE  GLUCOSE, CAPILLARY     Status: Abnormal   Collection Time    07/27/14  4:03 AM      Result Value Ref Range   Glucose-Capillary 121 (*) 70 - 99 mg/dL   Comment 1 Notify RN     Comment 2 Documented in Chart    GLUCOSE, CAPILLARY     Status: Abnormal   Collection Time    07/27/14  8:00 AM      Result Value Ref Range   Glucose-Capillary 129 (*) 70 - 99 mg/dL   Comment 1 Notify RN    GLUCOSE, CAPILLARY     Status: Abnormal   Collection Time    07/27/14 11:44 AM      Result Value Ref Range   Glucose-Capillary 159 (*) 70 - 99 mg/dL   Comment 1 Notify RN      Intake/Output Summary (Last 24 hours) at 07/27/14 1302 Last data filed at 07/27/14 1300  Gross per 24 hour  Intake 1430.2 ml  Output   1410 ml  Net   20.2 ml     ASSESSMENT AND PLAN:   BACTERIAL ENDOCARDITIS/AVR/MVR:  Doing reasonably well with this large operation.    Creat is up.  Nephrology has been consulted.    CHB:  Plans for possible replacement of pacemaker later this week.    Jeneen Rinks [facility] 07/27/2014 1:02 PM

## 2014-07-27 NOTE — Anesthesia Postprocedure Evaluation (Deleted)
  Anesthesia Post-op Note  Patient: Justin [name]  Procedure(s) Performed: Procedure(s): ROOT REPLACEMENT WITH BIOPROSTHETIC PORCINE AORTIC ROOT REIMPLANTATION OF LEFT MAIN AND RIGHT CORONARY ARTERIES (N/A) INTRAOPERATIVE TRANSESOPHAGEAL ECHOCARDIOGRAM (N/A) MAZE (N/A) CORONARY ARTERY BYPASS GRAFTING (CABG), on pump, times one, using right greater saphenous vein harvested endoscopically. (N/A) MITRAL VALVE (MV) REPLACEMENT (N/A)  Patient Location: ICU  Anesthesia Type:General  Level of Consciousness: sedated  Airway and Oxygen Therapy: Patient remains intubated per anesthesia plan  Post-op Pain: none  Post-op Assessment: Post-op Vital signs reviewed, Patient's Cardiovascular Status Stable and Respiratory Function Stable  Post-op Vital Signs: Reviewed and stable  Last Vitals:  Filed Vitals:   07/27/14 1400  BP: 124/69  Pulse: 79  Temp:   Resp: 28    Complications: No apparent anesthesia complications

## 2014-07-27 NOTE — Consult Note (Signed)
I have personally seen and examined this patient and agree with the assessment/plan as outlined above by He[place] DO ([id number]). Justin [name] has ARF on CKD likely from ischemic ATN but can not rule out post-infectious or SBE associated renal injury. With poor UOP and worsening renal function- will try a second dose of furosemide tonight and consider CRRT/HD if diuretics fail. Will give a dose of kayexalate for hyperkalemia.  Justin Mckey K.,MD 07/27/2014 5:52 PM

## 2014-07-27 NOTE — Consult Note (Signed)
Reason for Consult:ARF Referring Physician: Darylene Price, MD  Justin [name] is an 80 y.o. male.  HPI: Justin [name] is a 80 yo male with PMH of CAD, HTN, HLD, A fib, Moderate Aortic insufficiency, ITP presented to [facility] on 8/3 after he became hypotensive at the infusion center while he was being treated with IVIG.  He was subsequently admitted to the ICU with concern for sepsis.  He was subsequently found to have endocarditis as the source and underwent Aortic Root replacement, Mitral Valve Replacement, CABG to left circumflex and Maze procedure on 8/21 with Dr. Roxy Manns. His SCr prior to the surgery was 1.4 g/dL.  Post op he developed hypotension and required pressor support (Dopamine 8/21- current, neo-synephrine 8/21-8/22, levophed 8/22-8/23) .  Since that time his renal function has deteriorated and UOP has decreased to 485cc output on 8/22, 280cc on 8/23.   He was given 160mg  IV lasix this morning without improvement of UOP. While inpatient the only contrast imaging he received seems to be Abdominal CT on 07/21/2014.  He did receive Vancomycin from 8/3 to 8/8 and again on 8/21. Other antibiotics used Gentamicin, Cefuroximine, ceftriaxone, and ampicillin. No NSAID use.  Of note his Urine culture from admission grow enterobacter.  Trend in Creatinine: Creat  Date/Time Value Ref Range Status  06/24/2014  1:55 PM 1.5* 0.6 - 1.2 mg/dl Final  06/24/2014  1:20 PM 1.35  0.50 - 1.35 mg/dL Final  06/22/2014 11:18 AM 1.38* 0.50 - 1.35 mg/dL Final  06/10/2014 10:33 AM 1.21  0.50 - 1.35 mg/dL Final  05/20/2014 11:16 AM 1.34  0.50 - 1.35 mg/dL Final  04/14/2014 10:44 AM 1.25  0.50 - 1.35 mg/dL Final  01/13/2014  8:49 AM 1.11  0.50 - 1.35 mg/dL Final  11/18/2013 11:36 AM 1.16  0.50 - 1.35 mg/dL Final  06/03/2013  2:40 PM 1.21  0.50 - 1.35 mg/dL Final     Creatinine, Ser  Date/Time Value Ref Range Status  07/27/2014  4:00 AM 3.68* 0.50 - 1.35 mg/dL Final  07/26/2014  5:25 PM 2.69* 0.50 - 1.35 mg/dL Final  07/26/2014  3:53 AM  2.67* 0.50 - 1.35 mg/dL Final  07/25/2014  6:16 PM 2.50* 0.50 - 1.35 mg/dL Final  07/25/2014  5:00 PM 2.21* 0.50 - 1.35 mg/dL Final  07/25/2014  9:15 AM 1.74* 0.50 - 1.35 mg/dL Final  07/25/2014  3:00 AM 1.55* 0.50 - 1.35 mg/dL Final  07/09/2014  7:01 PM 1.40* 0.50 - 1.35 mg/dL Final  07/30/2014  6:26 PM 1.30  0.50 - 1.35 mg/dL Final  07/28/2014  6:13 PM 1.30  0.50 - 1.35 mg/dL Final  07/13/2014  4:35 PM 1.30  0.50 - 1.35 mg/dL Final  07/05/2014  3:26 PM 1.40* 0.50 - 1.35 mg/dL Final  07/04/2014  2:12 PM 1.40* 0.50 - 1.35 mg/dL Final  07/23/2014 12:23 PM 1.40* 0.50 - 1.35 mg/dL Final  07/15/2014  9:20 AM 1.30  0.50 - 1.35 mg/dL Final  07/13/2014  8:57 AM 1.50* 0.50 - 1.35 mg/dL Final  07/22/2014  7:47 AM 1.50* 0.50 - 1.35 mg/dL Final  07/23/2014  4:21 AM 1.68* 0.50 - 1.35 mg/dL Final  07/23/2014  5:30 AM 1.70* 0.50 - 1.35 mg/dL Final  07/22/2014  4:45 AM 1.84* 0.50 - 1.35 mg/dL Final  07/21/2014  4:30 AM 1.55* 0.50 - 1.35 mg/dL Final  07/20/2014  3:00 AM 1.57* 0.50 - 1.35 mg/dL Final  07/19/2014  3:28 AM 1.53* 0.50 - 1.35 mg/dL Final  07/18/2014  5:00 AM 1.45* 0.50 -  1.35 mg/dL Final  08/03/2014  5:00 AM 1.47* 0.50 - 1.35 mg/dL Final  07/16/2014 10:00 PM 1.54* 0.50 - 1.35 mg/dL Final  07/16/2014  3:57 AM 1.40* 0.50 - 1.35 mg/dL Final  07/15/2014  3:17 AM 1.41* 0.50 - 1.35 mg/dL Final  07/12/2014  5:37 AM 1.36* 0.50 - 1.35 mg/dL Final  07/13/2014  4:15 AM 1.50* 0.50 - 1.35 mg/dL Final  07/12/2014  4:30 AM 1.56* 0.50 - 1.35 mg/dL Final  07/11/2014  3:45 AM 1.43* 0.50 - 1.35 mg/dL Final  07/10/2014  4:10 AM 1.53* 0.50 - 1.35 mg/dL Final  07/09/2014  9:45 AM 1.57* 0.50 - 1.35 mg/dL Final  07/08/2014  2:35 AM 1.55* 0.50 - 1.35 mg/dL Final  07/07/2014  2:35 AM 1.57* 0.50 - 1.35 mg/dL Final  07/30/2014  3:30 PM 1.50* 0.50 - 1.35 mg/dL Final  05/20/2013  7:34 AM 1.2  0.4 - 1.5 mg/dL Final  04/03/2013 11:11 AM 1.1  0.4 - 1.5 mg/dL Final    PMH:   Past Medical History  Diagnosis Date  . CAD (coronary artery disease) prior  stenting 1999   . Dyslipidemia   . HTN (hypertension)   . WPW (Wolff-Parkinson-White syndrome) loss of preexcitation   . Atrial fibrillation   . AV block, Mobitz II   . Presyncope   . Myocardial infarction   . Pacemaker 04/07/2013  . Arthritis   . History of chicken pox   . Anemia   . Hypernatremia 06/03/2013  . Thrombocytopenia, unspecified 06/03/2013  . Leukopenia 06/03/2013  . Obstructive sleep apnea 06/03/2013    USES CPAP  . Urinary incontinence 06/03/2013  . Hyperglycemia 12/21/2013  . Pancytopenia 06/03/2013  . Iron deficiency anemia, unspecified 07/02/2014  . Malabsorption of iron 07/02/2014  . Hypotestosteronemia 07/02/2014  . ITP (idiopathic thrombocytopenic purpura) 07/02/2014    possible - although patient had bacterial endocarditis at the time of diagnosis  . Aortic insufficiency   . Chronic kidney disease   . Bacterial endocarditis      Enterococcus sepsis with aortic valve vegetation  . UTI (urinary tract infection) 07/06/2014    ENTEROBACTER CLOACAE   . S/P aortic valve and mitral valve replacement 07/14/2014    21 mm Medtronic Freestyle porcine aortic root graft 29 mm [facility] Mitral bovine bioprosthetic mitral valve   . S/P aortic valve replacement with stentless valve 07/17/2014    21 mm Medtronic Freestyle porcine aortic root graft with reimplantation of left main and right coronary arteries  . S/P mitral valve replacement with bioprosthetic valve 07/28/2014    29 mm [facility] Mitral bovine bioprosthetic tissue valve  . S/P CABG x 1 07/23/2014    SVG to OM1 with EVH via right thigh  . S/P Maze operation for atrial fibrillation 07/23/2014    Complete bilateral atrial lesion set using cryothermy and bipolar radiofrequency ablation with clipping of LA appendage  . Acute renal failure superimposed on stage 3 chronic kidney disease 07/27/2014  . Acute on chronic diastolic heart failure 5/99/7741    PSH:   Past Surgical History  Procedure Laterality Date  . Coronary  angioplasty with stent placement    . Vastectomy    . Insert / replace / remove pacemaker  04/07/2013  . Tee without cardioversion N/A 07/05/2014    Procedure: TRANSESOPHAGEAL ECHOCARDIOGRAM (TEE);  Surgeon: Lelon Perla, MD;  Location: [facility] ENDOSCOPY;  Service: Cardiovascular;  Laterality: N/A;    Allergies: No Known Allergies  Medications:   Prior to Admission medications  Medication Sig Start Date End Date Taking? Authorizing Provider  atorvastatin (LIPITOR) 10 MG tablet Take 1 tablet (10 mg total) by mouth every evening. 03/18/14  Yes Lelon Perla, MD  Azelaic Acid (FINACEA) 15 % cream Apply 1 application topically daily. After skin is thoroughly washed and patted dry, gently but thoroughly massage a thin film of azelaic acid creame   Yes Historical Provider, MD  Cholecalciferol (VITAMIN D-3) 1000 UNITS CAPS Take 1 capsule by mouth daily.   Yes Historical Provider, MD  furosemide (LASIX) 20 MG tablet Take 3 tablets (60 mg total) by mouth daily. 06/11/14  Yes Lelon Perla, MD  losartan (COZAAR) 50 MG tablet Take 1 tablet (50 mg total) by mouth daily. 06/03/14  Yes Lelon Perla, MD  Multiple Vitamin (MULTIVITAMIN WITH MINERALS) TABS Take 1 tablet by mouth 2 (two) times a week.    Yes Historical Provider, MD  nadolol (CORGARD) 20 MG tablet Take 10 mg by mouth daily. 03/18/14  Yes Lelon Perla, MD  nitroGLYCERIN (NITROSTAT) 0.4 MG SL tablet Place 1 tablet (0.4 mg total) under the tongue every 5 (five) minutes as needed for chest pain. x3 doses as needed for chest pain 03/18/14  Yes Lelon Perla, MD  omeprazole (PRILOSEC) 20 MG capsule Take 20 mg by mouth daily as needed (for heartburn).  04/28/14  Yes Mosie Lukes, MD  Polyethyl Glycol-Propyl Glycol (SYSTANE ULTRA OP) Apply 1 drop to eye 4 (four) times daily as needed (for dry eyes).   Yes Historical Provider, MD  predniSONE (DELTASONE) 20 MG tablet Take 80 mg by mouth daily with breakfast.   Yes Historical Provider, MD   Probiotic Product (PROBIOTIC DAILY PO) Take 1 capsule by mouth daily.    Yes Historical Provider, MD  pyridOXINE (VITAMIN B-6) 100 MG tablet Take 50 mg by mouth every evening.    Yes Historical Provider, MD  tamsulosin (FLOMAX) 0.4 MG CAPS capsule Take 0.4 mg by mouth at bedtime.   Yes Historical Provider, MD  vitamin B-12 (CYANOCOBALAMIN) 1000 MCG tablet Take 1,000 mcg by mouth every evening.   Yes Historical Provider, MD  Vitamin E (VITA-PLUS E PO) Take 1 capsule by mouth daily.   Yes Historical Provider, MD    Inpatient medications: . acetaminophen  1,000 mg Oral 4 times per day  . ampicillin (OMNIPEN) IV  2 g Intravenous 3 times per day  . antiseptic oral rinse  7 mL Mouth Rinse QID  . aspirin EC  81 mg Oral Daily  . bisacodyl  10 mg Oral Daily   Or  . bisacodyl  10 mg Rectal Daily  . cefTRIAXone (ROCEPHIN)  IV  2 g Intravenous Q12H  . chlorhexidine  15 mL Mouth Rinse BID  . docusate sodium  200 mg Oral Daily  . furosemide  120 mg Intravenous Once  . insulin aspart  0-24 Units Subcutaneous 6 times per day  . pantoprazole  40 mg Oral BID  . predniSONE  10 mg Oral BID WC  . sodium chloride  3 mL Intravenous Q12H    Discontinued Meds:   Medications Discontinued During This Encounter  Medication Reason  . sodium chloride 0.9 % bolus 500 mL   . 0.9 %  sodium chloride infusion   . ELIQUIS 5 MG TABS tablet Discontinued by provider  . HEMOCYTE-F 324-1 MG TABS Discontinued by provider  . fluticasone (FLONASE) 50 MCG/ACT nasal spray Patient has not taken in last 30 days  . predniSONE (DELTASONE) 20  MG tablet Inpatient Standard  . 0.9 %  sodium chloride infusion   . atorvastatin (LIPITOR) tablet 10 mg   . insulin aspart (novoLOG) injection 1-3 Units   . levofloxacin (LEVAQUIN) IVPB 750 mg   . hydrocortisone sodium succinate (SOLU-CORTEF) 100 MG injection 50 mg Inpatient Standard  . piperacillin-tazobactam (ZOSYN) IVPB 3.375 g Change in therapy  . norepinephrine (LEVOPHED) 4 mg in  dextrose 5 % 250 mL infusion   . famotidine (PEPCID) tablet 20 mg   . hydrocortisone sodium succinate (SOLU-CORTEF) 100 MG injection 25 mg   . furosemide (LASIX) tablet 40 mg   . 0.9 %  sodium chloride infusion   . insulin aspart (novoLOG) injection 1-3 Units   . insulin aspart (novoLOG) injection 0-9 Units   . vitamin B-12 (CYANOCOBALAMIN) tablet 1,000 mcg   . vancomycin (VANCOCIN) IVPB 750 mg/150 ml premix   . potassium chloride SA (K-DUR,KLOR-CON) CR tablet 40 mEq   . antiseptic oral rinse (CPC / CETYLPYRIDINIUM CHLORIDE 0.05%) solution 7 mL   . midazolam (VERSED) injection Patient Discharge  . fentaNYL (SUBLIMAZE) injection Patient Discharge  . butamben-tetracaine-benzocaine (CETACAINE) spray Patient Discharge  . 0.9 %  sodium chloride infusion Patient Transfer  . lidocaine-prilocaine (EMLA) cream 1 application Patient Transfer  . gentamicin (GARAMYCIN) 80 mg in sodium chloride irrigation 0.9 % 500 mL irrigation Patient Transfer  . 0.9 %  sodium chloride infusion Patient Transfer  . acetaminophen (TYLENOL) tablet 956 mg Duplicate  . 0.9 %  sodium chloride infusion   . 0.9 %  sodium chloride infusion   . predniSONE (DELTASONE) tablet 80 mg   . insulin aspart (novoLOG) injection 0-9 Units   . gentamicin (GARAMYCIN) IVPB 80 mg Change in therapy  . predniSONE (DELTASONE) tablet 80 mg   . furosemide (LASIX) tablet 80 mg   . amLODipine (NORVASC) tablet 5 mg   . 0.9 %  sodium chloride infusion   . 0.9 %  sodium chloride infusion   . 0.9 %  sodium chloride infusion   . sodium chloride 0.9 % injection 3 mL   . sodium chloride 0.9 % injection 3 mL   . sodium chloride 0.9 % injection 10-40 mL   . predniSONE (DELTASONE) tablet 40 mg   . amLODipine (NORVASC) tablet 7.5 mg   . furosemide (LASIX) tablet 80 mg   . sodium chloride 0.9 % injection 10-40 mL   . predniSONE (DELTASONE) tablet 30 mg   . furosemide (LASIX) tablet 60 mg   . predniSONE (DELTASONE) tablet 20 mg   . amLODipine  (NORVASC) tablet 10 mg   . furosemide (LASIX) tablet 60 mg   . furosemide (LASIX) tablet 60 mg   . amLODipine (NORVASC) tablet 2.5 mg   . predniSONE (DELTASONE) tablet 10 mg   . desmopressin (DDAVP) injection 24 mcg Entry Error  . pantoprazole (PROTONIX) EC tablet 40 mg   . pyridOXINE (VITAMIN B-6) tablet 100 mg   . vitamin E capsule 400 Units   . polyvinyl alcohol (LIQUIFILM TEARS) 1.4 % ophthalmic solution 1 drop   . tamsulosin (FLOMAX) capsule 0.4 mg   . atorvastatin (LIPITOR) tablet 10 mg   . acetaminophen (TYLENOL) tablet 325-650 mg   . ondansetron (ZOFRAN) injection 4 mg   . gentamicin (GARAMYCIN) IVPB 100 mg   . feeding supplement (ENSURE COMPLETE) (ENSURE COMPLETE) liquid 237 mL   . sodium chloride 0.9 % injection 10-40 mL   . sodium chloride 0.9 % injection 10-40 mL   . chlorhexidine (PERIDEX) 0.12 %  solution 15 mL   . fluticasone (FLONASE) 50 MCG/ACT nasal spray 1 spray   . amLODipine (NORVASC) tablet 5 mg   . predniSONE (DELTASONE) tablet 5 mg   . insulin aspart (novoLOG) injection 0-9 Units   . cefUROXime (ZINACEF) 750 mg in dextrose 5 % 50 mL IVPB   . hemostatic agents Patient Discharge  . microfibrllar collagen (AVITENE) pad Patient Discharge  . Surgifoam 1 Gm with 0.9% sodium chloride (4 ml) topical solution Patient Discharge  . sodium chloride irrigation 0.9 % Patient Discharge  . 0.9 %  sodium chloride infusion   . 0.9 %  sodium chloride infusion   . aminocaproic acid (AMICAR) 5 g in sodium chloride 0.9 % 50 mL infusion   . dexmedetomidine (PRECEDEX) 200 MCG/50ML (4 mcg/mL) infusion   . EPINEPHrine (ADRENALIN) 4 mg in dextrose 5 % 250 mL (0.016 mg/mL) infusion   . glutaraldehyde 0.625% soaking solution Patient Transfer  . heparin 30,000 units/NS 1000 mL solution for CELLSAVER   . magnesium sulfate (IV Push/IM) injection 40 mEq   . milrinone (PRIMACOR) 20 MG/100ML (0.2 mg/mL) infusion   . potassium chloride injection 80 mEq   . insulin regular (NOVOLIN R,HUMULIN  R) 250 Units in sodium chloride 0.9 % 250 mL (1 Units/mL) infusion   . insulin regular bolus via infusion 0-10 Units   . 0.9 %  sodium chloride infusion   . metoprolol (LOPRESSOR) injection 2.5-5 mg   . metoprolol tartrate (LOPRESSOR) tablet 12.5 mg   . metoprolol tartrate (LOPRESSOR) 25 mg/10 mL oral suspension 12.5 mg   . dexmedetomidine (PRECEDEX) 200 MCG/50ML (4 mcg/mL) infusion   . midazolam (VERSED) injection 2 mg   . milrinone (PRIMACOR) 20 MG/100ML (0.2 mg/mL) infusion   . phenylephrine (NEO-SYNEPHRINE) 20 mg in dextrose 5 % 250 mL (0.08 mg/mL) infusion   . nitroGLYCERIN 50 mg in dextrose 5 % 250 mL (0.2 mg/mL) infusion   . cefUROXime (ZINACEF) 1.5 g in dextrose 5 % 50 mL IVPB   . fentaNYL (SUBLIMAZE) injection 100 mcg   . midazolam (VERSED) injection 2 mg   . vecuronium (NORCURON) injection 8 mg   . norepinephrine (LEVOPHED) 4 mg in dextrose 5 % 250 mL infusion   . lactated ringers infusion   . 0.45 % sodium chloride infusion   . 0.9 %  sodium chloride infusion   . hydrocortisone sodium succinate (SOLU-CORTEF) 100 MG injection 50 mg   . 0.9 %  sodium chloride infusion   . acetaminophen (TYLENOL) solution 1,000 mg   . aspirin chewable tablet 324 mg   . morphine 2 MG/ML injection 2-5 mg   . oxyCODONE (Oxy IR/ROXICODONE) immediate release tablet 5-10 mg   . aspirin EC tablet 325 mg   . pantoprazole (PROTONIX) EC tablet 40 mg   . ampicillin (OMNIPEN) 2 g in sodium chloride 0.9 % 50 mL IVPB     Social History:  reports that he quit smoking about 22 years ago. His smoking use included Cigarettes. He started smoking about 59 years ago. He has a 36 pack-year smoking history. He has never used smokeless tobacco. He reports that he drinks alcohol. He reports that he does not use illicit drugs.  Family History:   Family History  Problem Relation Age of Onset  . Stroke Mother   . Hypertension Mother   . Stroke Sister   . Arthritis Sister     rheumatoid  . Heart attack Sister   .  Anemia Sister   . Other Brother  tube put in aorta  . Anemia Brother   . Other Son     cortisone deficiency  . Arthritis Son   . Stroke Brother   . Alcohol abuse Brother   . Barrett's esophagus Son   . Colon cancer Maternal Grandmother   . Colon cancer Maternal Grandfather     Review of Systems  Constitutional: Positive for chills (prior to admission) and malaise/fatigue. Negative for fever.  Eyes: Negative for blurred vision.  Respiratory: Negative for cough and shortness of breath.   Cardiovascular: Positive for chest pain (some tenderness) and leg swelling.  Gastrointestinal: Negative for abdominal pain.  Genitourinary: Negative for dysuria.  Musculoskeletal: Negative for back pain.  Skin: Negative for rash.  Neurological: Positive for weakness.  All other systems reviewed and are negative.   Weight change: 5 lb 15.2 oz (2.7 kg)  Intake/Output Summary (Last 24 hours) at 07/27/14 1626 Last data filed at 07/27/14 1600  Gross per 24 hour  Intake 1565.2 ml  Output   1400 ml  Net  165.2 ml   BP 123/69  Pulse 78  Temp(Src) 98.4 F (36.9 C) (Oral)  Resp 24  Ht 5\' 11"  (1.803 m)  Wt 216 lb 14.9 oz (98.4 kg)  BMI 30.27 kg/m2  SpO2 100% Filed Vitals:   07/27/14 1300 07/27/14 1400 07/27/14 1500 07/27/14 1600  BP: 111/77 124/69 123/70 123/69  Pulse: 80 79 80 78  Temp:      TempSrc:      Resp: 20 28 23 24   Height:      Weight:      SpO2: 100% 98% 99% 100%     General: resting in bed HEENT: MMM Cardiac: RRR, systolic murmur Chest: Midline surgical incision no drainage Pulm: CTAB Abd: soft, nontender, nondistended, BS present GU: Foley in place draining yellow urine Ext: warm and well perfused, 2+ pedal edema bilaterally Neuro: non focal, no asterixis   Labs: Basic Metabolic Panel:  Recent Labs Lab 07/21/14 0430 07/22/14 0445 07/23/14 0530 07/09/2014 0421  07/30/2014 1901 07/04/2014 2022 07/25/14 0300 07/25/14 0915 07/25/14 1700 07/25/14 1816  07/26/14 0353 07/26/14 1725 07/27/14 0400  NA 138 140 140 134*  < > 136* 137 135* 137  --  131* 133* 134* 131*  K 4.0 4.2 4.0 4.1  < > 4.6 4.3 5.0 5.2  --  5.0 5.2 4.6 5.8*  CL 96 99 97 94*  < > 97  --  97 99  --  107 96 103 94*  CO2 32 33* 32 31  --   --   --  23 22  --   --  20 17* 18*  GLUCOSE 96 90 76 84  < > 130* 96 136* 184*  --  184* 131* 94 124*  BUN 28* 34* 33* 30*  < > 23  --  25* 27*  --  35* 37* 38* 50*  CREATININE 1.55* 1.84* 1.70* 1.68*  < > 1.40*  --  1.55* 1.74* 2.21* 2.50* 2.67* 2.69* 3.68*  ALBUMIN 2.3* 2.2* 2.3*  --   --   --   --   --  3.1*  --   --  2.8*  --  2.6*  CALCIUM 8.7 8.3* 8.6 8.4  --   --   --  7.8* 7.5*  --   --  7.5* 6.1* 7.7*  < > = values in this interval not displayed. Liver Function Tests:  Recent Labs Lab 07/25/14 0915 07/26/14 0353 07/27/14 0400  AST 135*  138* 109*  ALT 26 31 36  ALKPHOS 33* 48 69  BILITOT 1.1 0.6 0.4  PROT 4.7* 5.2* 5.7*  ALBUMIN 3.1* 2.8* 2.6*   No results found for this basename: LIPASE, AMYLASE,  in the last 168 hours No results found for this basename: AMMONIA,  in the last 168 hours CBC:  Recent Labs Lab 07/25/14 0300 07/25/14 1700 07/25/14 1816 07/26/14 0353 07/27/14 0400  WBC 6.9 9.7  --  11.4* 10.5  HGB 10.7* 9.3* 8.8* 9.8* 9.8*  HCT 32.0* 27.0* 26.0* 29.1* 29.6*  MCV 93.6 90.9  --  94.2 96.7  PLT 174 188  --  PLATELET CLUMPS NOTED ON SMEAR, COUNT APPEARS ADEQUATE PLATELET CLUMPS NOTED ON SMEAR, COUNT APPEARS ADEQUATE   PT/INR: @LABRCNTIP (inr:5) Cardiac Enzymes: )No results found for this basename: CKTOTAL, CKMB, CKMBINDEX, TROPONINI,  in the last 168 hours CBG:  Recent Labs Lab 07/26/14 2015 07/26/14 2335 07/27/14 0403 07/27/14 0800 07/27/14 1144  GLUCAP 110* 113* 121* 129* 159*    Iron Studies: No results found for this basename: IRON, TIBC, TRANSFERRIN, FERRITIN,  in the last 168 hours  Xrays/Other Studies: Dg Chest Port 1 View  07/27/2014   CLINICAL DATA:  Status post heart surgery.   EXAM: PORTABLE CHEST - 1 VIEW  COMPARISON:  07/26/2014  FINDINGS: The Swan-Ganz catheter has been removed. The right IJ Cordis is still in place. The right PICC line is unchanged. Stable bilateral chest tubes without pneumothorax. Persistent but slightly improved areas bilateral atelectasis. No definite pleural effusions.  IMPRESSION: Stable support apparatus.  No pneumothorax.  Slight improved lung aeration with resolving areas of atelectasis.   Electronically Signed   By: Kalman Jewels M.D.   On: 07/27/2014 07:42   Dg Chest Port 1 View  07/26/2014   CLINICAL DATA:  Status post cardiac procedures  EXAM: PORTABLE CHEST - 1 VIEW  COMPARISON:  Portable chest x-ray of July 25, 2014  FINDINGS: The right lung is adequately inflated. There is no pneumothorax or pleural effusion. The interstitial markings have improved. The chest tube tip on the right is unchanged in the lateral costophrenic gutter.  The left lung is well-expanded. The interstitial markings remain increased but have improved. There is no pneumothorax. The left upper chest tube is unchanged in position. A chest tube at the level of the left hemidiaphragm also appears stable. An additional tube to the left of the thoracic spine is demonstrated with its tip at the level of the AP window.  The cardiac silhouette is top-normal in size. A left atrial appendage clip is present. A mitral valve ring is demonstrated. There is a Swan-Ganz catheter whose tip lies in the proximal right descending pulmonary artery. There are coronary the artery bypass rings present.  IMPRESSION: There has been mild interval improvement in the appearance of the pulmonary interstitium especially on the right. The support tubes and lines are in reasonable position.   Electronically Signed   By: David  Martinique   On: 07/26/2014 07:50     Assessment/Plan: 1.  Acute Oliguric Renal Failure: SCr baseline ~1.2, likely due to ischemic ATN after his post op hypotension.  He is now on  dopamine with poor UOP, hyperkalemia, AG metabolic acidosis.  His last U/A was on the 18th and was normal.   1. Will repeat U/A today, also check FeUrea, and renal Ultrasound to ensure cause of ARF. 2. Will give addition Lasix 120mg  dose this afternoon and monitor UOP if still no improvement will likely need dialysis  tomorrow. 2.  Hyperkalemia- repeat BMP pending if K+ >5.6 will give 45g of kayexalate 3. AG metabolic acidosis: likely secondary to #1. 4. Anemia- Heme following, Hgb stable at 9.8, received Aranesp. 5. Thrombocytopenia- Heme following- Plts noted to be in clumps and adequate 6. GPC endocarditis: s/p MVR, Aortic root replacement, CABG, ID following: dual B lactam therapy with ampicillin and ceftriaxone.   Justin [name] 07/27/2014, 4:26 PM

## 2014-07-27 NOTE — Progress Notes (Addendum)
[address]       [place] [address]             [phone]        CARDIOTHORACIC SURGERY PROGRESS NOTE   R3 Days Post-Op Procedure(s) (LRB): ROOT REPLACEMENT WITH BIOPROSTHETIC PORCINE AORTIC ROOT REIMPLANTATION OF LEFT MAIN AND RIGHT CORONARY ARTERIES (N/A) INTRAOPERATIVE TRANSESOPHAGEAL ECHOCARDIOGRAM (N/A) MAZE (N/A) CORONARY ARTERY BYPASS GRAFTING (CABG), on pump, times one, using right greater saphenous vein harvested endoscopically. (N/A) MITRAL VALVE (MV) REPLACEMENT (N/A)  Subjective: Looks good and feels well.  Very mild soreness in chest.  Denies SOB.  No nausea.  Objective: Vital signs: BP Readings from Last 1 Encounters:  07/27/14 146/77   Pulse Readings from Last 1 Encounters:  07/27/14 80   Resp Readings from Last 1 Encounters:  07/27/14 0   Temp Readings from Last 1 Encounters:  07/27/14 97.7 F (36.5 C) Oral    Hemodynamics: PAP: (36-59)/(14-27) 59/20 mmHg CO:  [4.9 L/min] 4.9 L/min CI:  [2.4 L/min/m2] 2.4 L/min/m2  Physical Exam:  Rhythm:   AV paced - no escape rhythm - V-wire threshold < 1.0 mV  Breath sounds: clear  Heart sounds:  RRR w/out murmur  Incisions:  Dressing dry, intact  Abdomen:  Soft, non-distended, non-tender  Extremities:  Warm, well-perfused, very swollen    Intake/Output from previous day: 08/23 0701 - 08/24 0700 In: 1619.2 [P.O.:300; I.V.:1019.2; IV Piggyback:300] Out: 1030 [Urine:280; Chest Tube:750] Intake/Output this shift:    Lab Results:  CBC: Recent Labs  07/26/14 0353 07/27/14 0400  WBC 11.4* 10.5  HGB 9.8* 9.8*  HCT 29.1* 29.6*  PLT PLATELET CLUMPS NOTED ON SMEAR, COUNT APPEARS ADEQUATE PLATELET CLUMPS NOTED ON SMEAR, COUNT APPEARS ADEQUATE    BMET:  Recent Labs  07/26/14 1725 07/27/14 0400  NA 134* 131*  K 4.6 5.8*  CL 103 94*  CO2 17* 18*  GLUCOSE 94 124*  BUN 38* 50*  CREATININE 2.69* 3.68*  CALCIUM 6.1* 7.7*     CBG (last 3)   Recent Labs  07/26/14 2015  07/26/14 2335 07/27/14 0403  GLUCAP 110* 113* 121*    ABG    Component Value Date/Time   PHART 7.395 07/25/2014 2059   PCO2ART 31.8* 07/25/2014 2059   PO2ART 65.0* 07/25/2014 2059   HCO3 19.5* 07/25/2014 2059   TCO2 20 07/25/2014 2059   ACIDBASEDEF 5.0* 07/25/2014 2059   O2SAT 93.0 07/25/2014 2059    CXR:  PORTABLE CHEST - 1 VIEW  COMPARISON: 07/26/2014  FINDINGS:  The Swan-Ganz catheter has been removed. The right IJ Cordis is  still in place. The right PICC line is unchanged. Stable bilateral  chest tubes without pneumothorax. Persistent but slightly improved  areas bilateral atelectasis. No definite pleural effusions.  IMPRESSION:  Stable support apparatus. No pneumothorax.  Slight improved lung aeration with resolving areas of atelectasis.  Electronically Signed  By: Kalman Jewels M.D.  On: 07/27/2014 07:42         Assessment/Plan: S/P Procedure(s) (LRB): ROOT REPLACEMENT WITH BIOPROSTHETIC PORCINE AORTIC ROOT REIMPLANTATION OF LEFT MAIN AND RIGHT CORONARY ARTERIES (N/A) INTRAOPERATIVE TRANSESOPHAGEAL ECHOCARDIOGRAM (N/A) MAZE (N/A) CORONARY ARTERY BYPASS GRAFTING (CABG), on pump, times one, using right greater saphenous vein harvested endoscopically. (N/A) MITRAL VALVE (MV) REPLACEMENT (N/A)  Overall looks remarkably good POD3 Bacterial endocarditis s/p AVR+MVR - OR cultures pending - now on Ampicillin + Rocephin Hemodynamically stable off all drips except renal-dose dopamine Remains pacer-dependent w/out much of an escape rhythm, temporary V-wires  working well Pre-op h/o symptomatic bradycardia, s/p removal of pre-existing perm pacer Acute renal failure on underlying chronic kidney disease, remains oliguric, potassium up to 5.8, severe volume overload but minimal pulm edema, breathing comfortably Expected post op volume excess, severe Expected post op acute blood loss anemia, mild, stable Expected post op atelectasis, mild, L>R Pre and post op thrombocytopenia,  platelet count unclear, no signs of bleeding Protein-depleted malnutrition, pre and post-op Pre-op high-dose steroids, now on hydrocortisone stress dose x 72 hrs   Try high dose lasix to stimulate UOP, watch potassium closely, keep Foley in place to monitor [facility] team - may need temporary dialysis  Continue renal dose dopamine for now  Keep back-up pacer at bedside, watch V-wire thresholds - ultimately will need perm pacer replaced - perhaps by the end of this week  Keep pleural tubes until dry  Mobilize as much as possible  PT consult  Advance diet and add supplements  Stop Hydrocortisone and restart Prednisone, taper dose  Antibiotics per ID team - f/u culture results  Watch platelet count, anemia  SCD boots for DVT prophylaxis, no pharmacologic anticoagulation, decrease ASA to 81 mg/day   Adael Culbreath H 07/27/2014 8:04 AM

## 2014-07-27 NOTE — Progress Notes (Signed)
CT surgery p.m. Rounds  Postoperative acute on chronic renal insufficiency following double valve replacement for endocarditis Hemodynamic stable urine output low with rising creatinine Patient has been evaluated by nephrology and may require CVVH tomorrow Lasix ordered and repeat potassium ordered for this p.m.--currently 5.9 Paced rhythm at 80 on renal dopamine

## 2014-07-27 NOTE — Evaluation (Signed)
Physical Therapy Evaluation Patient Details Name: Justin [name] MRN: [id number] DOB: 1932-12-09 Today's Date: 07/27/2014   History of Present Illness  Pt is a 80 y.o. male with history of CAD status post stents, hypertension, dyslipidemia, a-fib, pacemaker, ITP currently being treated with IVIG, who presents to the emergency department with complaints of fever and shortness of breath. Pt found to have bacterial endocarditis and underwent MVR, Aortic root replacement, and CABG on 08/02/2014.  Clinical Impression  Pt admitted with above. Pt currently with functional limitations due to the deficits listed below (see PT Problem List).  Pt will benefit from skilled PT to increase their independence and safety with mobility to allow discharge to the venue listed below. Pt's functional status greatly diminished after major surgery. Now feel pt will need ST-SNF prior to return home.      Follow Up Recommendations SNF    Equipment Recommendations  Other (comment) (rollator)    Recommendations for Other Services       Precautions / Restrictions Precautions Precautions: Sternal;Fall      Mobility  Bed Mobility Overal bed mobility: Needs Assistance Bed Mobility: Sit to Supine       Sit to supine: +2 for physical assistance;Max assist   General bed mobility comments: Assist to lower trunk and to bring legs up into bed.  Transfers Overall transfer level: Needs assistance Equipment used: Rolling walker (2 wheeled) Transfers: Sit to/from [facility] Sit to Stand: +2 physical assistance;Mod assist Stand pivot transfers: +2 physical assistance;Mod assist       General transfer comment: Verbal cues for hand placement for sternal precautions. Assist to bring hips up and to perform pivotal steps from recliner to bsc. BSC to bed by standing up with walker and switching bed and bsc behind him.  Ambulation/Gait                Stairs            Wheelchair  Mobility    Modified Rankin (Stroke Patients Only)       Balance   Sitting-balance support: Bilateral upper extremity supported Sitting balance-Leahy Scale: Poor     Standing balance support: Bilateral upper extremity supported Standing balance-Leahy Scale: Poor Standing balance comment: Support of walker and 1 person to maintain.                             Pertinent Vitals/Pain Pain Assessment: No/denies pain    Home Living Family/patient expects to be discharged to:: Private residence (Independent living) Living Arrangements: Spouse/significant other Available Help at Discharge: Family;Available PRN/intermittently Type of Home: House Home Access: Level entry     Home Layout: One level Home Equipment: Walker - 2 wheels;Cane - single point;Shower seat - built in;Grab bars - toilet;Grab bars - tub/shower;Toilet riser      Prior Function Level of Independence: Independent         Comments: Still driving, does most of the grocery shopping, minimal cooking - meals provided,.     Hand Dominance   Dominant Hand: Right    Extremity/Trunk Assessment   Upper Extremity Assessment: Generalized weakness           Lower Extremity Assessment: Generalized weakness         Communication   Communication: No difficulties  Cognition Arousal/Alertness: Awake/alert Behavior During Therapy: WFL for tasks assessed/performed Overall Cognitive Status: Impaired/Different from baseline Area of Impairment: Orientation;Memory Orientation Level: Disoriented to;Place;Time;Situation   Memory: Decreased  short-term memory;Decreased recall of precautions         General Comments: Pt hallucinating that bugs are in his room although he can verbalize that he knows that this isn't real. Pt thought he was somewhere that he was going to have a meeting and that we needed to move the bed out of the room for the meeting.    General Comments      Exercises         Assessment/Plan    PT Assessment Patient needs continued PT services  PT Diagnosis Difficulty walking;Generalized weakness   PT Problem List Decreased strength;Decreased activity tolerance;Decreased balance;Decreased mobility;Decreased cognition;Decreased knowledge of use of DME;Decreased safety awareness;Decreased knowledge of precautions  PT Treatment Interventions DME instruction;Gait training;Functional mobility training;Therapeutic activities;Therapeutic exercise;Balance training;Patient/family education;Cognitive remediation   PT Goals (Current goals can be found in the Care Plan section) Acute Rehab PT Goals Patient Stated Goal: To return home with wife PT Goal Formulation: Patient unable to participate in goal setting Time For Goal Achievement: 08/10/14 Potential to Achieve Goals: Good    Frequency Min 3X/week   Barriers to discharge        Co-evaluation               End of Session Equipment Utilized During Treatment: Oxygen Activity Tolerance: Patient limited by fatigue Patient left: in bed;with call bell/phone within reach;with bed alarm set Nurse Communication: Mobility status (nurse assisted with transfer)         Time: 0258-5277 PT Time Calculation (min): 22 min   Charges:   PT Evaluation $Initial PT Evaluation Tier I: 1 Procedure PT Treatments $Gait Training: 8-22 mins   PT G Codes:          Justin [name] 2014-08-13, 5:14 PM  [facility] PT [phone]

## 2014-07-28 ENCOUNTER — Inpatient Hospital Stay (HOSPITAL_COMMUNITY): Payer: Medicare Other

## 2014-07-28 ENCOUNTER — Encounter (HOSPITAL_COMMUNITY): Payer: Self-pay | Admitting: Thoracic Surgery (Cardiothoracic Vascular Surgery)

## 2014-07-28 LAB — BASIC METABOLIC PANEL
Anion gap: 18 — ABNORMAL HIGH (ref 5–15)
BUN: 66 mg/dL — ABNORMAL HIGH (ref 6–23)
CALCIUM: 7.5 mg/dL — AB (ref 8.4–10.5)
CO2: 22 meq/L (ref 19–32)
Chloride: 95 mEq/L — ABNORMAL LOW (ref 96–112)
Creatinine, Ser: 4.04 mg/dL — ABNORMAL HIGH (ref 0.50–1.35)
GFR calc Af Amer: 15 mL/min — ABNORMAL LOW (ref >90)
GFR, EST NON AFRICAN AMERICAN: 13 mL/min — AB (ref >90)
GLUCOSE: 122 mg/dL — AB (ref 70–99)
POTASSIUM: 4 meq/L (ref 3.7–5.3)
Sodium: 135 mEq/L — ABNORMAL LOW (ref 137–147)

## 2014-07-28 LAB — GLUCOSE, CAPILLARY
GLUCOSE-CAPILLARY: 108 mg/dL — AB (ref 70–99)
GLUCOSE-CAPILLARY: 87 mg/dL (ref 70–99)
GLUCOSE-CAPILLARY: 157 mg/dL — AB (ref 70–99)
Glucose-Capillary: 108 mg/dL — ABNORMAL HIGH (ref 70–99)
Glucose-Capillary: 112 mg/dL — ABNORMAL HIGH (ref 70–99)

## 2014-07-28 LAB — COMPREHENSIVE METABOLIC PANEL
ALBUMIN: 2.4 g/dL — AB (ref 3.5–5.2)
ALT: 35 U/L (ref 0–53)
AST: 74 U/L — ABNORMAL HIGH (ref 0–37)
Alkaline Phosphatase: 102 U/L (ref 39–117)
Anion gap: 18 — ABNORMAL HIGH (ref 5–15)
BUN: 62 mg/dL — ABNORMAL HIGH (ref 6–23)
CO2: 21 mEq/L (ref 19–32)
Calcium: 7.7 mg/dL — ABNORMAL LOW (ref 8.4–10.5)
Chloride: 96 mEq/L (ref 96–112)
Creatinine, Ser: 4.11 mg/dL — ABNORMAL HIGH (ref 0.50–1.35)
GFR calc Af Amer: 15 mL/min — ABNORMAL LOW (ref >90)
GFR calc non Af Amer: 13 mL/min — ABNORMAL LOW (ref >90)
Glucose, Bld: 95 mg/dL (ref 70–99)
Potassium: 4.6 mEq/L (ref 3.7–5.3)
SODIUM: 135 meq/L — AB (ref 137–147)
Total Bilirubin: 0.3 mg/dL (ref 0.3–1.2)
Total Protein: 5.6 g/dL — ABNORMAL LOW (ref 6.0–8.3)

## 2014-07-28 LAB — CBC
HCT: 28.1 % — ABNORMAL LOW (ref 39.0–52.0)
Hemoglobin: 9.2 g/dL — ABNORMAL LOW (ref 13.0–17.0)
MCH: 31.1 pg (ref 26.0–34.0)
MCHC: 32.7 g/dL (ref 30.0–36.0)
MCV: 94.9 fL (ref 78.0–100.0)
PLATELETS: 231 10*3/uL (ref 150–400)
RBC: 2.96 MIL/uL — AB (ref 4.22–5.81)
RDW: 21.3 % — AB (ref 11.5–15.5)
WBC: 9.6 10*3/uL (ref 4.0–10.5)

## 2014-07-28 LAB — PREALBUMIN: Prealbumin: 11.5 mg/dL — ABNORMAL LOW (ref 17.0–34.0)

## 2014-07-28 LAB — UREA NITROGEN, URINE: Urea Nitrogen, Ur: 172 mg/dL

## 2014-07-28 MED ORDER — ONDANSETRON 8 MG/NS 50 ML IVPB
8.0000 mg | Freq: Three times a day (TID) | INTRAVENOUS | Status: DC
  Administered 2014-07-28 – 2014-08-03 (×18): 8 mg via INTRAVENOUS
  Filled 2014-07-28 (×22): qty 8

## 2014-07-28 MED ORDER — PROMETHAZINE HCL 25 MG/ML IJ SOLN
6.2500 mg | Freq: Three times a day (TID) | INTRAMUSCULAR | Status: DC | PRN
  Administered 2014-07-29: 6.25 mg via INTRAVENOUS
  Filled 2014-07-28: qty 1

## 2014-07-28 MED ORDER — SODIUM CHLORIDE 0.9 % IJ SOLN: 10.0000 mL | INTRAMUSCULAR | Status: DC | PRN

## 2014-07-28 MED ORDER — DEXTROSE 5 % IV SOLN
120.0000 mg | Freq: Once | INTRAVENOUS | Status: AC
  Administered 2014-07-28: 120 mg via INTRAVENOUS
  Filled 2014-07-28: qty 12

## 2014-07-28 MED ORDER — SODIUM CHLORIDE 0.9 % IJ SOLN
10.0000 mL | Freq: Two times a day (BID) | INTRAMUSCULAR | Status: DC
  Administered 2014-07-28 – 2014-07-30 (×3): 10 mL via INTRAVENOUS

## 2014-07-28 MED ORDER — PREDNISONE 10 MG PO TABS
10.0000 mg | ORAL_TABLET | Freq: Every day | ORAL | Status: DC
  Administered 2014-07-28 – 2014-07-29 (×2): 10 mg via ORAL
  Filled 2014-07-28 (×4): qty 1

## 2014-07-28 MED ORDER — PRO-STAT SUGAR FREE PO LIQD
30.0000 mL | Freq: Two times a day (BID) | ORAL | Status: DC
  Administered 2014-07-28: 18:00:00 via ORAL
  Administered 2014-07-29: 30 mL via ORAL
  Administered 2014-07-30 – 2014-07-31 (×2): via ORAL
  Administered 2014-08-01 (×2): 30 mL via ORAL
  Filled 2014-07-28 (×15): qty 30

## 2014-07-28 MED ORDER — FUROSEMIDE 10 MG/ML IJ SOLN
160.0000 mg | Freq: Once | INTRAMUSCULAR | Status: AC
  Administered 2014-07-28: 160 mg via INTRAVENOUS
  Filled 2014-07-28: qty 16

## 2014-07-28 MED ORDER — TRAMADOL HCL 50 MG PO TABS
50.0000 mg | ORAL_TABLET | Freq: Two times a day (BID) | ORAL | Status: DC | PRN
  Administered 2014-08-02 – 2014-08-30 (×8): 50 mg via ORAL
  Filled 2014-07-28 (×8): qty 1

## 2014-07-28 MED ORDER — ENSURE COMPLETE PO LIQD
237.0000 mL | Freq: Two times a day (BID) | ORAL | Status: DC
  Administered 2014-07-29 – 2014-08-11 (×20): 237 mL via ORAL
  Administered 2014-08-12: 120 mL via ORAL
  Administered 2014-08-15 – 2014-08-31 (×25): 237 mL via ORAL
  Filled 2014-07-28: qty 237

## 2014-07-28 MED FILL — Potassium Chloride Inj 2 mEq/ML: INTRAVENOUS | Qty: 40 | Status: AC

## 2014-07-28 MED FILL — Magnesium Sulfate Inj 50%: INTRAMUSCULAR | Qty: 10 | Status: AC

## 2014-07-28 MED FILL — Heparin Sodium (Porcine) Inj 1000 Unit/ML: INTRAMUSCULAR | Qty: 30 | Status: AC

## 2014-07-28 NOTE — Progress Notes (Signed)
S:No acute events overnight, patient reports he is doing well. UOP has increase significantly. O:BP 118/66  Pulse 84  Temp(Src) 97.6 F (36.4 C) (Oral)  Resp 19  Ht 5\' 11"  (1.803 m)  Wt 214 lb 1.1 oz (97.1 kg)  BMI 29.87 kg/m2  SpO2 99%  Intake/Output Summary (Last 24 hours) at 07/28/14 0854 Last data filed at 07/28/14 0700  Gross per 24 hour  Intake 1390.4 ml  Output   2165 ml  Net -774.6 ml   Intake/Output: I/O last 3 completed shifts: In: 1987.8 [P.O.:840; I.V.:697.8; IV Piggyback:450] Out: 2925 [JQBHA:1937; Chest Tube:1350]  Intake/Output this shift:    Weight change: -2 lb 13.9 oz (-1.3 kg) TKW:IOXBDZH in chair in NAD CVS:RRR Resp:CTAB Abd:+ BS, soft, NT Ext:3+ pedal edema   Recent Labs Lab 07/22/14 0445 07/23/14 0530  07/25/14 0915  07/25/14 1816 07/26/14 0353 07/26/14 1725 07/27/14 0400 07/27/14 1619 07/27/14 2245 07/28/14 0432  NA 140 140  < > 137  --  131* 133* 134* 131* 135* 135* 135*  K 4.2 4.0  < > 5.2  --  5.0 5.2 4.6 5.8* 5.9* 5.1 4.6  CL 99 97  < > 99  --  107 96 103 94* 96 95* 96  CO2 33* 32  < > 22  --   --  20 17* 18* 21 21 21   GLUCOSE 90 76  < > 184*  --  184* 131* 94 124* 83 109* 95  BUN 34* 33*  < > 27*  --  35* 37* 38* 50* 58* 59* 62*  CREATININE 1.84* 1.70*  < > 1.74*  < > 2.50* 2.67* 2.69* 3.68* 3.76* 4.15* 4.11*  ALBUMIN 2.2* 2.3*  --  3.1*  --   --  2.8*  --  2.6*  --   --  2.4*  CALCIUM 8.3* 8.6  < > 7.5*  --   --  7.5* 6.1* 7.7* 7.6* 7.6* 7.7*  AST 45* 48*  --  135*  --   --  138*  --  109*  --   --  74*  ALT 32 31  --  26  --   --  31  --  36  --   --  35  < > = values in this interval not displayed. Liver Function Tests:  Recent Labs Lab 07/26/14 0353 07/27/14 0400 07/28/14 0432  AST 138* 109* 74*  ALT 31 36 35  ALKPHOS 48 69 102  BILITOT 0.6 0.4 0.3  PROT 5.2* 5.7* 5.6*  ALBUMIN 2.8* 2.6* 2.4*   No results found for this basename: LIPASE, AMYLASE,  in the last 168 hours No results found for this basename: AMMONIA,   in the last 168 hours CBC:  Recent Labs Lab 07/25/14 0300 07/25/14 1700  07/26/14 0353 07/27/14 0400 07/28/14 0432  WBC 6.9 9.7  --  11.4* 10.5 9.6  HGB 10.7* 9.3*  < > 9.8* 9.8* 9.2*  HCT 32.0* 27.0*  < > 29.1* 29.6* 28.1*  MCV 93.6 90.9  --  94.2 96.7 94.9  PLT 174 188  --  PLATELET CLUMPS NOTED ON SMEAR, COUNT APPEARS ADEQUATE PLATELET CLUMPS NOTED ON SMEAR, COUNT APPEARS ADEQUATE 231  < > = values in this interval not displayed. Cardiac Enzymes: No results found for this basename: CKTOTAL, CKMB, CKMBINDEX, TROPONINI,  in the last 168 hours CBG:  Recent Labs Lab 07/27/14 1144 07/27/14 1659 07/27/14 1928 07/27/14 2338 07/28/14 0354  GLUCAP 159* 78 93 114* 108*  Iron Studies: No results found for this basename: IRON, TIBC, TRANSFERRIN, FERRITIN,  in the last 72 hours Studies/Results: US Renal  07/28/2014   CLINICAL DATA:  Acute renal failure.  EXAM: RENAL/URINARY TRACT ULTRASOUND COMPLETE  COMPARISON:  CT dated 07/09/2014.  FINDINGS: Right Kidney:  Length: 11.2 cm. 1.7 x 1.4 x 1.3 cm exophytic hypoechoic mass arising from the mid to upper right kidney, laterally. This measured 20 Hounsfield units on the recent noncontrast CT examination. Otherwise, normal appearing right kidney without hydronephrosis.  Left Kidney:  Length: 10.5 cm. Echogenicity within normal limits. No mass or hydronephrosis visualized.  Bladder:  Nondistended, with a Foley catheter in place.  A small amount of free peritoneal fluid is noted.  IMPRESSION: 1. No hydronephrosis. 2. 1.7 cm hypoechoic mass rising from the mid upper right kidney, laterally. Again, this most likely represents a proteinaceous cyst. A solid mass is less likely. 3. Small amount of ascites.   Electronically Signed   By: Enrique Sack M.D.   On: 07/28/2014 00:47   Dg Chest Port 1 View  07/28/2014   CLINICAL DATA:  Aortic root replacement and CABG  EXAM: PORTABLE CHEST - 1 VIEW  COMPARISON:  Prior chest x-ray 07/27/2014  FINDINGS: Stable  position of right IJ vascular sheath and right upper extremity PICC. Catheter tips are within the SVC. Mediastinal and bilateral chest tubes are also unchanged. Patient is status post median sternotomy with evidence of aortic valve replacement, ligation of the left atrial appendage and multivessel CABG. Stable cardiac and mediastinal contours. Atherosclerotic calcification in the transverse aorta. Similar to incrementally increased patchy opacity in the periphery of the left mid lung. No pneumothorax, significant pleural effusion or evidence of edema.  IMPRESSION: 1. Stable and satisfactory support apparatus. 2. Similar to incrementally increased patchy opacity in the periphery of the left mid lung. Differential considerations include shifting atelectasis, focal alveolar hemorrhage, and potentially developing infiltrate. 3. Otherwise, unchanged appearance of the chest.   Electronically Signed   By: Jacqulynn Cadet M.D.   On: 07/28/2014 08:04   Dg Chest Port 1 View  07/27/2014   CLINICAL DATA:  Status post heart surgery.  EXAM: PORTABLE CHEST - 1 VIEW  COMPARISON:  07/26/2014  FINDINGS: The Swan-Ganz catheter has been removed. The right IJ Cordis is still in place. The right PICC line is unchanged. Stable bilateral chest tubes without pneumothorax. Persistent but slightly improved areas bilateral atelectasis. No definite pleural effusions.  IMPRESSION: Stable support apparatus.  No pneumothorax.  Slight improved lung aeration with resolving areas of atelectasis.   Electronically Signed   By: Kalman Jewels M.D.   On: 07/27/2014 07:42   . acetaminophen  1,000 mg Oral 4 times per day  . ampicillin (OMNIPEN) IV  2 g Intravenous 3 times per day  . antiseptic oral rinse  7 mL Mouth Rinse QID  . aspirin EC  81 mg Oral Daily  . bisacodyl  10 mg Oral Daily   Or  . bisacodyl  10 mg Rectal Daily  . cefTRIAXone (ROCEPHIN)  IV  2 g Intravenous Q12H  . chlorhexidine  15 mL Mouth Rinse BID  . docusate sodium  200  mg Oral Daily  . furosemide  160 mg Intravenous Once  . insulin aspart  0-24 Units Subcutaneous 6 times per day  . ondansetron (ZOFRAN) IV  8 mg Intravenous 3 times per day  . pantoprazole  40 mg Oral BID  . predniSONE  10 mg Oral Q breakfast  . sodium  chloride  3 mL Intravenous Q12H    BMET    Component Value Date/Time   NA 135* 07/28/2014 0432   NA 134 06/24/2014 1355   K 4.6 07/28/2014 0432   K 3.4 06/24/2014 1355   CL 96 07/28/2014 0432   CL 98 06/24/2014 1355   CO2 21 07/28/2014 0432   CO2 30 06/24/2014 1355   GLUCOSE 95 07/28/2014 0432   GLUCOSE 124* 06/24/2014 1355   BUN 62* 07/28/2014 0432   BUN 34* 06/24/2014 1355   CREATININE 4.11* 07/28/2014 0432   CREATININE 1.5* 06/24/2014 1355   CALCIUM 7.7* 07/28/2014 0432   CALCIUM 8.8 06/24/2014 1355   GFRNONAA 13* 07/28/2014 0432   GFRNONAA 50* 06/24/2014 1320   GFRAA 15* 07/28/2014 0432   GFRAA 57* 06/24/2014 1320   CBC    Component Value Date/Time   WBC 9.6 07/28/2014 0432   WBC 14.0* 07/14/2014 0833   RBC 2.96* 07/28/2014 0432   RBC 2.91* 07/12/2014 0430   RBC 2.98* 07/14/2014 0833   HGB 9.2* 07/28/2014 0432   HGB 9.5* 07/28/2014 0833   HCT 28.1* 07/28/2014 0432   HCT 28.8* 07/09/2014 0833   PLT 231 07/28/2014 0432   PLT 10* 08/02/2014 0833   MCV 94.9 07/28/2014 0432   MCV 97 07/04/2014 0833   MCH 31.1 07/28/2014 0432   MCH 31.9 07/26/2014 0833   MCHC 32.7 07/28/2014 0432   MCHC 33.0 07/29/2014 0833   RDW 21.3* 07/28/2014 0432   RDW 14.8 07/22/2014 0833   LYMPHSABS 1.0 07/08/2014 0235   LYMPHSABS 0.8* 07/31/2014 0833   MONOABS 0.7 07/08/2014 0235   EOSABS 0.0 07/08/2014 0235   EOSABS 0.0 07/27/2014 0833   BASOSABS 0.0 07/08/2014 0235   BASOSABS 0.0 07/19/2014 0833   Renal U/S: No hydronephrosis, 1.7 cm hypoechoic mass on mid upper right kidney- likely proteinaceous cyst, small amount of ascites.  Assessment/Plan:  1. Acute on CKD stage 2 Renal Failure: Baseline Scr ~1.2.  Patient has responded well to PM dose of Lasix while on dopamine drip.  His UOP was  1.4L yesterday.  His appears to have leveled off at 4.1.  His FeUrea is 20%, U/A shows moderate Leuk and large Hgb, protein of 30. 1. Continue Lasix 120mg  BID and follow renal function, no acute indication for dialysis at this time. 2. Hyperkalemia- resolved after treatment with kayaxelate/lasix 3. AG metabolic acidosis: secondary to #1.  Improved today with bicarb of 21. 4. Anemia: Heme following, Hgb stable, has received Aranesp 5. Thrombocytopenia: Heme following, platelet count adequate. 6. GPC endocarditis: s/p MVR, Aortic root replacement, CABG, ID following: dual B lactam therapy with ampicillin and ceftriaxone.  Lucious Groves, DO  [facility], PGY2

## 2014-07-28 NOTE — Progress Notes (Signed)
[facility] [facility] for Infectious Disease  Date of Admission:  07/14/2014  Antibiotics: Ampicillin ceftriaxone  Subjective: No acute events  Objective: Temp:  [97.6 F (36.4 C)-98 F (36.7 C)] 98 F (36.7 C) (08/25 1133) Pulse Rate:  [77-88] 87 (08/25 1300) Resp:  [14-28] 16 (08/25 1300) BP: (102-135)/(49-73) 134/62 mmHg (08/25 1300) SpO2:  [91 %-100 %] 96 % (08/25 1300) Weight:  [214 lb 1.1 oz (97.1 kg)] 214 lb 1.1 oz (97.1 kg) (08/25 0500)  General: awake, alert, nad Skin: no rashes, sternal incision noted  Lungs: CTA B Cor: RRR Ext: no edema  Lab Results Lab Results  Component Value Date   WBC 9.6 07/28/2014   HGB 9.2* 07/28/2014   HCT 28.1* 07/28/2014   MCV 94.9 07/28/2014   PLT 231 07/28/2014    Lab Results  Component Value Date   CREATININE 4.04* 07/28/2014   BUN 66* 07/28/2014   NA 135* 07/28/2014   K 4.0 07/28/2014   CL 95* 07/28/2014   CO2 22 07/28/2014    Lab Results  Component Value Date   ALT 35 07/28/2014   AST 74* 07/28/2014   ALKPHOS 102 07/28/2014   BILITOT 0.3 07/28/2014      Microbiology: Recent Results (from the past 240 hour(s))  SURGICAL PCR SCREEN     Status: None   Collection Time    07/21/14 11:05 AM      Result Value Ref Range Status   MRSA, PCR NEGATIVE  NEGATIVE Final   Staphylococcus aureus NEGATIVE  NEGATIVE Final   Comment:            The Xpert SA Assay (FDA     approved for NASAL specimens     in patients over 2 years of age),     is one component of     a comprehensive surveillance     program.  Test performance has     been validated by [employer] for patients greater     than or equal to 105 year old.     It is not intended     to diagnose infection nor to     guide or monitor treatment.  GRAM STAIN     Status: None   Collection Time    07/09/2014  9:36 AM      Result Value Ref Range Status   Specimen Description TISSUE   Final   Special Requests AORTIC VALVE VEGETATION HEART PT ON ZINACEF VANCO   Final   Gram Stain      Final   Value: FEW WBC PRESENT,BOTH PMN AND MONONUCLEAR     ABUNDANT GRAM POSITIVE COCCI IN PAIRS   Report Status 07/26/2014 FINAL   Final  TISSUE CULTURE     Status: None   Collection Time    07/09/2014  9:36 AM      Result Value Ref Range Status   Specimen Description TISSUE OTHER   Final   Special Requests AORTIC VALVE VEGETATION   Final   Gram Stain     Final   Value: NO WBC SEEN     NO SQUAMOUS EPITHELIAL CELLS SEEN     NO ORGANISMS SEEN     Performed at [facility]   Culture     Final   Value: FEW ENTEROCOCCUS SPECIES     Performed at [facility]   Report Status PENDING   Incomplete  ANAEROBIC CULTURE     Status: None  Collection Time    07/29/2014  9:36 AM      Result Value Ref Range Status   Specimen Description TISSUE OTHER   Final   Special Requests AORTIC VALVE VEGETATION   Final   Gram Stain PENDING   Incomplete   Culture     Final   Value: NO ANAEROBES ISOLATED; CULTURE IN PROGRESS FOR 5 DAYS     Performed at [facility]   Report Status PENDING   Incomplete    Studies/Results: US Renal  07/28/2014   CLINICAL DATA:  Acute renal failure.  EXAM: RENAL/URINARY TRACT ULTRASOUND COMPLETE  COMPARISON:  CT dated 07/09/2014.  FINDINGS: Right Kidney:  Length: 11.2 cm. 1.7 x 1.4 x 1.3 cm exophytic hypoechoic mass arising from the mid to upper right kidney, laterally. This measured 20 Hounsfield units on the recent noncontrast CT examination. Otherwise, normal appearing right kidney without hydronephrosis.  Left Kidney:  Length: 10.5 cm. Echogenicity within normal limits. No mass or hydronephrosis visualized.  Bladder:  Nondistended, with a Foley catheter in place.  A small amount of free peritoneal fluid is noted.  IMPRESSION: 1. No hydronephrosis. 2. 1.7 cm hypoechoic mass rising from the mid upper right kidney, laterally. Again, this most likely represents a proteinaceous cyst. A solid mass is less likely. 3. Small amount of ascites.   Electronically  Signed   By: Enrique Sack M.D.   On: 07/28/2014 00:47   Dg Chest Port 1 View  07/28/2014   CLINICAL DATA:  Aortic root replacement and CABG  EXAM: PORTABLE CHEST - 1 VIEW  COMPARISON:  Prior chest x-ray 07/27/2014  FINDINGS: Stable position of right IJ vascular sheath and right upper extremity PICC. Catheter tips are within the SVC. Mediastinal and bilateral chest tubes are also unchanged. Patient is status post median sternotomy with evidence of aortic valve replacement, ligation of the left atrial appendage and multivessel CABG. Stable cardiac and mediastinal contours. Atherosclerotic calcification in the transverse aorta. Similar to incrementally increased patchy opacity in the periphery of the left mid lung. No pneumothorax, significant pleural effusion or evidence of edema.  IMPRESSION: 1. Stable and satisfactory support apparatus. 2. Similar to incrementally increased patchy opacity in the periphery of the left mid lung. Differential considerations include shifting atelectasis, focal alveolar hemorrhage, and potentially developing infiltrate. 3. Otherwise, unchanged appearance of the chest.   Electronically Signed   By: Jacqulynn Cadet M.D.   On: 07/28/2014 08:04   Dg Chest Port 1 View  07/27/2014   CLINICAL DATA:  Status post heart surgery.  EXAM: PORTABLE CHEST - 1 VIEW  COMPARISON:  07/26/2014  FINDINGS: The Swan-Ganz catheter has been removed. The right IJ Cordis is still in place. The right PICC line is unchanged. Stable bilateral chest tubes without pneumothorax. Persistent but slightly improved areas bilateral atelectasis. No definite pleural effusions.  IMPRESSION: Stable support apparatus.  No pneumothorax.  Slight improved lung aeration with resolving areas of atelectasis.   Electronically Signed   By: Kalman Jewels M.D.   On: 07/27/2014 07:42    Assessment/Plan: 1) Enterococcal endocarditis s/p valve replacement - on dual beta lactam therapy with ampicillin and ceftriaxone.  Valve  growing Enterococcus.    Scharlene Gloss, [place] for Infectious Disease [place] [email or web address] [phone] pager   [phone] cell 07/28/2014, 3:20 PM

## 2014-07-28 NOTE — Progress Notes (Signed)
[name] continues to look better. He was sitting in a chair this morning I saw him.  He was seen by nephrology. They are trying to help manage his renal insufficiency.  I saw the pathology report. He clearly needed to have the valve replaced. Both valves had bacteria. Dr.Owens  did a fantastic job.  His renal function seems to be stable right now. His BUN is 62 and a creatinine 4.11.  His platelet count is now 231,000. Hemoglobin is 9.2.   His vital signs look good. Blood pressure is 118/66. Heart rate 84. He still has the chest tubes in on his right side. Hopefully one or both of these can be taken out.  There has been no problems with bleeding. His hemoglobin is fairly stable. He did get Aranesp yesterday.  We will continue to follow along. He continues on his aspirin.  I will see about getting him off prednisone. He is only on 20 mg a day. With his platelet count looking so good, I think that we can taper down his prednisone.  Pete E.  1 Thessalonians 5:16-18

## 2014-07-28 NOTE — Progress Notes (Addendum)
[address]       [place] [id number]             [phone]        CARDIOTHORACIC SURGERY PROGRESS NOTE   R4 Days Post-Op Procedure(s) (LRB): ROOT REPLACEMENT WITH BIOPROSTHETIC PORCINE AORTIC ROOT REIMPLANTATION OF LEFT MAIN AND RIGHT CORONARY ARTERIES (N/A) INTRAOPERATIVE TRANSESOPHAGEAL ECHOCARDIOGRAM (N/A) MAZE (N/A) CORONARY ARTERY BYPASS GRAFTING (CABG), on pump, times one, using right greater saphenous vein harvested endoscopically. (N/A) MITRAL VALVE (MV) REPLACEMENT (N/A)  Subjective: Looks good.  Had a good night.  No SOB.  Denies pain.  Objective: Vital signs: BP Readings from Last 1 Encounters:  07/28/14 118/66   Pulse Readings from Last 1 Encounters:  07/28/14 84   Resp Readings from Last 1 Encounters:  07/28/14 19   Temp Readings from Last 1 Encounters:  07/28/14 98 F (36.7 C) Oral    Hemodynamics:    Physical Exam:  Rhythm:   Junctional 50's under pacer, AV pacing  Breath sounds: clear  Heart sounds:  RRR  Incisions:  Clean and dry  Abdomen:  Soft, non-distended, non-tender  Extremities:  Warm, well-perfused    Intake/Output from previous day: 08/24 0701 - 08/25 0700 In: 1395.2 [P.O.:840; I.V.:255.2; IV Piggyback:300] Out: 2465 [Urine:1415; Chest Tube:1050] Intake/Output this shift:    Lab Results:  CBC: Recent Labs  07/27/14 0400 07/28/14 0432  WBC 10.5 9.6  HGB 9.8* 9.2*  HCT 29.6* 28.1*  PLT PLATELET CLUMPS NOTED ON SMEAR, COUNT APPEARS ADEQUATE 231    BMET:  Recent Labs  07/27/14 2245 07/28/14 0432  NA 135* 135*  K 5.1 4.6  CL 95* 96  CO2 21 21  GLUCOSE 109* 95  BUN 59* 62*  CREATININE 4.15* 4.11*  CALCIUM 7.6* 7.7*     CBG (last 3)   Recent Labs  07/27/14 1928 07/27/14 2338 07/28/14 0354  GLUCAP 93 114* 108*    ABG    Component Value Date/Time   PHART 7.395 07/25/2014 2059   PCO2ART 31.8* 07/25/2014 2059   PO2ART 65.0* 07/25/2014 2059   HCO3 19.5* 07/25/2014 2059   TCO2 20  07/25/2014 2059   ACIDBASEDEF 5.0* 07/25/2014 2059   O2SAT 93.0 07/25/2014 2059    CXR: Looks remarkably good, improved  Assessment/Plan: S/P Procedure(s) (LRB): ROOT REPLACEMENT WITH BIOPROSTHETIC PORCINE AORTIC ROOT REIMPLANTATION OF LEFT MAIN AND RIGHT CORONARY ARTERIES (N/A) INTRAOPERATIVE TRANSESOPHAGEAL ECHOCARDIOGRAM (N/A) MAZE (N/A) CORONARY ARTERY BYPASS GRAFTING (CABG), on pump, times one, using right greater saphenous vein harvested endoscopically. (N/A) MITRAL VALVE (MV) REPLACEMENT (N/A)  Overall looks remarkably good POD4 Bacterial endocarditis s/p AVR+MVR - OR cultures pending - now on Ampicillin + Rocephin Hemodynamically stable off all drips except renal-dose dopamine Remains pacer-dependent but escape rhythm much improved, temporary V-wires working well Pre-op h/o symptomatic bradycardia, s/p removal of pre-existing perm pacer Acute renal failure on underlying chronic kidney disease, UOP improved some overnight, severe volume overload but minimal pulm edema, breathing comfortably, potassium down to 4.6 Expected post op volume excess, severe Expected post op acute blood loss anemia, mild, stable Expected post op atelectasis, mild, L>R Pre and post op thrombocytopenia, platelet count now normal, no signs of bleeding Protein-depleted malnutrition, pre and post-op Pre-op high-dose steroids, now tapering Prednisone     Try another dose lasix to stimulate UOP, watch potassium closely, keep Foley in place to monitor UOP  Continue renal dose dopamine for now  Keep back-up pacer at bedside, watch V-wire thresholds - ultimately  will need perm pacer replaced - perhaps by the end of this week  Keep pleural tubes until dry, d/c mediastinal tubes  Mobilize as much as possible  PT consult  Advance diet and add supplements  Taper Prednisone  Antibiotics per ID team - f/u culture results  Watch platelet count, anemia  SCD boots for DVT prophylaxis, no pharmacologic  anticoagulation, decrease ASA to 81 mg/day   Novah Nessel H 07/28/2014 7:36 AM

## 2014-07-28 NOTE — Progress Notes (Signed)
I have personally seen and examined this patient and agree with the assessment/plan as outlined above by Heber Vienna DO (PGY2). Improved UOP overnight after lasix challenge while on doppamine, potassium better s/p kayexalate and renal function appears to have reached plateau. No dialysis needs at this time and will retry lasix this AM- re-evaluate in PM to decide on repeat dosing. Shawnice Tilmon K.,MD 07/28/2014 9:38 AM

## 2014-07-28 NOTE — Progress Notes (Addendum)
INITIAL NUTRITION ASSESSMENT  DOCUMENTATION CODES Per approved criteria  -Not Applicable   INTERVENTION: Ensure Complete po BID, each supplement provides 350 kcal and 13 grams of protein Prostat liquid protein po 30 ml BID with meals, each supplement provides 100 kcal, 15 grams protein RD to follow for nutrition care plan  NUTRITION DIAGNOSIS: Increased nutrient needs related to post-op healing as evidenced by estimated nutrition needs  Goal: Pt to meet >/= 90% of their estimated nutrition needs   Monitor:  PO & supplemental intake, weight, labs, I/O's  Reason for Assessment: Consult  80 y.o. male  Admitting Dx: Bacterial endocarditis  ASSESSMENT: 80 y.o. Male with history of CAD status post stents, HTN, dyslipidemia, atrial fibrillation no longer on anticoagulation secondary to recent GI bleed, pacemaker, ITP currently being treated with IVIG by Dr. Marin Olp (most recently 8/3) and prednisone 80mg  daily, who presented to the emergency department with complaints of fever and shortness of breath that started 8/2. In ED found to have leukocytosis, hypotension, and lactic acidosis.    Patient s/p procedures 8/21: AORTIC ROOT REPLACEMENT MITRAL VALVE REPLACEMENT CORONARY BYPASS GRAFTING MAZE PROCEDURE  Pt extubated post-op 8/22.  Advanced to Clear Liquids 8/23, Full Liquids 8/24 and this AM to Heart Healthy/Carbohydrate Modified.    RD consulted for addition of protein supplementation.    Patient sleeping upon RD visit.  Unable to wake.  Lunch tray untouched on tray table.  PO intake has been rather poor at 30% per flowsheet records.  Nephrology following for ARF.  No dialysis needs at this time.  RD unable to complete Nutrition Focused Physical Exam at this time.  Height: Ht Readings from Last 1 Encounters:  07/08/14 5\' 11"  (1.803 m)    Weight: Wt Readings from Last 1 Encounters:  07/28/14 214 lb 1.1 oz (97.1 kg)    Ideal Body Weight: 172 lb  % Ideal Body Weight:  124%  Wt Readings from Last 10 Encounters:  07/28/14 214 lb 1.1 oz (97.1 kg)  07/28/14 214 lb 1.1 oz (97.1 kg)  07/28/14 214 lb 1.1 oz (97.1 kg)  07/28/14 214 lb 1.1 oz (97.1 kg)  07/28/14 214 lb 1.1 oz (97.1 kg)  07/28/14 214 lb 1.1 oz (97.1 kg)  07/03/14 181 lb (82.101 kg)  06/24/14 185 lb (83.915 kg)  06/24/14 184 lb 1.3 oz (83.498 kg)  06/03/14 188 lb (85.276 kg)    Usual Body Weight: 181 lb  % Usual Body Weight: 118%  BMI:  Body mass index is 29.87 kg/(m^2).  Estimated Nutritional Needs: Kcal: 1900-2100 Protein: 100-110 gm Fluid: per MD  Skin: chest surgical incision   Diet Order: Heart Healthy/Carbohydrate Modified   EDUCATION NEEDS: -No education needs identified at this time   Intake/Output Summary (Last 24 hours) at 07/28/14 1505 Last data filed at 07/28/14 1400  Gross per 24 hour  Intake  666.8 ml  Output   2990 ml  Net -2323.2 ml    Labs:   Recent Labs Lab 07/25/14 0300  07/25/14 1700  07/27/14 2245 07/28/14 0432 07/28/14 1400  NA 135*  < >  --   < > 135* 135* 135*  K 5.0  < >  --   < > 5.1 4.6 4.0  CL 97  < >  --   < > 95* 96 95*  CO2 23  < >  --   < > 21 21 22   BUN 25*  < >  --   < > 59* 62* 66*  CREATININE 1.55*  < >  2.21*  < > 4.15* 4.11* 4.04*  CALCIUM 7.8*  < >  --   < > 7.6* 7.7* 7.5*  MG 2.9*  --  2.8*  --   --   --   --   GLUCOSE 136*  < >  --   < > 109* 95 122*  < > = values in this interval not displayed.  CBG (last 3)   Recent Labs  07/28/14 0354 07/28/14 0747 07/28/14 1200  GLUCAP 108* 87 157*    Scheduled Meds: . acetaminophen  1,000 mg Oral 4 times per day  . ampicillin (OMNIPEN) IV  2 g Intravenous 3 times per day  . antiseptic oral rinse  7 mL Mouth Rinse QID  . aspirin EC  81 mg Oral Daily  . bisacodyl  10 mg Oral Daily   Or  . bisacodyl  10 mg Rectal Daily  . cefTRIAXone (ROCEPHIN)  IV  2 g Intravenous Q12H  . chlorhexidine  15 mL Mouth Rinse BID  . docusate sodium  200 mg Oral Daily  . insulin aspart   0-24 Units Subcutaneous 6 times per day  . ondansetron (ZOFRAN) IV  8 mg Intravenous 3 times per day  . pantoprazole  40 mg Oral BID  . predniSONE  10 mg Oral Q breakfast  . sodium chloride  3 mL Intravenous Q12H    Continuous Infusions: . sodium chloride    . DOPamine 3 mcg/kg/min (07/28/14 0800)    Past Medical History  Diagnosis Date  . CAD (coronary artery disease) prior stenting 1999   . Dyslipidemia   . HTN (hypertension)   . WPW (Wolff-Parkinson-White syndrome) loss of preexcitation   . Atrial fibrillation   . AV block, Mobitz II   . Presyncope   . Myocardial infarction   . Pacemaker 04/07/2013  . Arthritis   . History of chicken pox   . Anemia   . Hypernatremia 06/03/2013  . Thrombocytopenia, unspecified 06/03/2013  . Leukopenia 06/03/2013  . Obstructive sleep apnea 06/03/2013    USES CPAP  . Urinary incontinence 06/03/2013  . Hyperglycemia 12/21/2013  . Pancytopenia 06/03/2013  . Iron deficiency anemia, unspecified 07/02/2014  . Malabsorption of iron 07/02/2014  . Hypotestosteronemia 07/02/2014  . ITP (idiopathic thrombocytopenic purpura) 07/02/2014    possible - although patient had bacterial endocarditis at the time of diagnosis  . Aortic insufficiency   . Chronic kidney disease   . Bacterial endocarditis      Enterococcus sepsis with aortic valve vegetation  . UTI (urinary tract infection) 07/19/2014    ENTEROBACTER CLOACAE   . S/P aortic valve and mitral valve replacement 07/11/2014    21 mm Medtronic Freestyle porcine aortic root graft 29 mm [facility] Mitral bovine bioprosthetic mitral valve   . S/P aortic valve replacement with stentless valve 07/27/2014    21 mm Medtronic Freestyle porcine aortic root graft with reimplantation of left main and right coronary arteries  . S/P mitral valve replacement with bioprosthetic valve 07/23/2014    29 mm [facility] Mitral bovine bioprosthetic tissue valve  . S/P CABG x 1 07/05/2014    SVG to OM1 with EVH via right thigh  . S/P  Maze operation for atrial fibrillation 07/19/2014    Complete bilateral atrial lesion set using cryothermy and bipolar radiofrequency ablation with clipping of LA appendage  . Acute renal failure superimposed on stage 3 chronic kidney disease 07/27/2014  . Acute on chronic diastolic heart failure 4/70/9628    Past Surgical  History  Procedure Laterality Date  . Coronary angioplasty with stent placement    . Vastectomy    . Insert / replace / remove pacemaker  04/07/2013  . Tee without cardioversion N/A 07/28/2014    Procedure: TRANSESOPHAGEAL ECHOCARDIOGRAM (TEE);  Surgeon: Lelon Perla, MD;  Location: [facility];  Service: Cardiovascular;  Laterality: N/A;  . Aortic valve replacement N/A 07/29/2014    Procedure: ROOT REPLACEMENT WITH BIOPROSTHETIC PORCINE AORTIC ROOT REIMPLANTATION OF LEFT MAIN AND RIGHT CORONARY ARTERIES;  Surgeon: Rexene Alberts, MD;  Location: [place];  Service: Open Heart Surgery;  Laterality: N/A;  . Intraoperative transesophageal echocardiogram N/A 07/16/2014    Procedure: INTRAOPERATIVE TRANSESOPHAGEAL ECHOCARDIOGRAM;  Surgeon: Rexene Alberts, MD;  Location: [place];  Service: Open Heart Surgery;  Laterality: N/A;  . Maze N/A 07/12/2014    Procedure: MAZE;  Surgeon: Rexene Alberts, MD;  Location: [place];  Service: Open Heart Surgery;  Laterality: N/A;  . Coronary artery bypass graft N/A 07/30/2014    Procedure: CORONARY ARTERY BYPASS GRAFTING (CABG), on pump, times one, using right greater saphenous vein harvested endoscopically.;  Surgeon: Rexene Alberts, MD;  Location: [facility];  Service: Open Heart Surgery;  Laterality: N/A;  . Mitral valve replacement N/A 07/14/2014    Procedure: MITRAL VALVE (MV) REPLACEMENT;  Surgeon: Rexene Alberts, MD;  Location: [place];  Service: Open Heart Surgery;  Laterality: N/A;    Arthur Holms, RD, LDN Pager #: [phone] After-Hours Pager #: [phone]

## 2014-07-28 NOTE — Progress Notes (Signed)
Patient ID: Justin [name], male   DOB: [birth year], 80 y.o.   MRN: [id number] EVENING ROUNDS NOTE :     [address]       ,[place] [address]             [phone]                 4 Days Post-Op Procedure(s) (LRB): ROOT REPLACEMENT WITH BIOPROSTHETIC PORCINE AORTIC ROOT REIMPLANTATION OF LEFT MAIN AND RIGHT CORONARY ARTERIES (N/A) INTRAOPERATIVE TRANSESOPHAGEAL ECHOCARDIOGRAM (N/A) MAZE (N/A) CORONARY ARTERY BYPASS GRAFTING (CABG), on pump, times one, using right greater saphenous vein harvested endoscopically. (N/A) MITRAL VALVE (MV) REPLACEMENT (N/A)  Total Length of Stay:  LOS: 22 days  BP 135/74  Pulse 87  Temp(Src) 97.4 F (36.3 C) (Oral)  Resp 22  Ht 5\' 11"  (1.803 m)  Wt 214 lb 1.1 oz (97.1 kg)  BMI 29.87 kg/m2  SpO2 95%  .Intake/Output     08/24 0701 - 08/25 0700 08/25 0701 - 08/26 0700   P.O. 840    I.V. (mL/kg) 255.2 (2.6)    IV Piggyback 300 50   Total Intake(mL/kg) 1395.2 (14.4) 50 (0.5)   Urine (mL/kg/hr) 1415 (0.6) 1210 (1.2)   Chest Tube 1050 (0.5) 240 (0.2)   Total Output 2465 1450   Net -1069.8 -1400          . sodium chloride    . DOPamine 3 mcg/kg/min (07/28/14 0800)     Lab Results  Component Value Date   WBC 9.6 07/28/2014   HGB 9.2* 07/28/2014   HCT 28.1* 07/28/2014   PLT 231 07/28/2014   GLUCOSE 122* 07/28/2014   CHOL 126 01/13/2014   TRIG 66 01/13/2014   HDL 49 01/13/2014   LDLCALC 64 01/13/2014   ALT 35 07/28/2014   AST 74* 07/28/2014   NA 135* 07/28/2014   K 4.0 07/28/2014   CL 95* 07/28/2014   CREATININE 4.04* 07/28/2014   BUN 66* 07/28/2014   CO2 22 07/28/2014   TSH 4.750* 07/07/2014   PSA 2.81 06/03/2013   INR 1.38 07/25/2014   HGBA1C 5.4 07/21/2014   Up to chair , slight improvement in cr platelets up to [place] MD  Beeper [phone] Office [phone] 07/28/2014 5:03 PM

## 2014-07-29 LAB — IRON AND TIBC
Iron: 28 ug/dL — ABNORMAL LOW (ref 42–135)
Saturation Ratios: 18 % — ABNORMAL LOW (ref 20–55)
TIBC: 155 ug/dL — ABNORMAL LOW (ref 215–435)
UIBC: 127 ug/dL (ref 125–400)

## 2014-07-29 LAB — RENAL FUNCTION PANEL
Albumin: 2.4 g/dL — ABNORMAL LOW (ref 3.5–5.2)
Anion gap: 19 — ABNORMAL HIGH (ref 5–15)
BUN: 67 mg/dL — AB (ref 6–23)
CALCIUM: 8 mg/dL — AB (ref 8.4–10.5)
CHLORIDE: 98 meq/L (ref 96–112)
CO2: 23 meq/L (ref 19–32)
CREATININE: 3.9 mg/dL — AB (ref 0.50–1.35)
GFR calc Af Amer: 16 mL/min — ABNORMAL LOW (ref >90)
GFR calc non Af Amer: 13 mL/min — ABNORMAL LOW (ref >90)
Glucose, Bld: 89 mg/dL (ref 70–99)
Phosphorus: 7.3 mg/dL — ABNORMAL HIGH (ref 2.3–4.6)
Potassium: 3.1 mEq/L — ABNORMAL LOW (ref 3.7–5.3)
Sodium: 140 mEq/L (ref 137–147)

## 2014-07-29 LAB — GLUCOSE, CAPILLARY
GLUCOSE-CAPILLARY: 108 mg/dL — AB (ref 70–99)
GLUCOSE-CAPILLARY: 85 mg/dL (ref 70–99)
GLUCOSE-CAPILLARY: 86 mg/dL (ref 70–99)
Glucose-Capillary: 195 mg/dL — ABNORMAL HIGH (ref 70–99)
Glucose-Capillary: 129 mg/dL — ABNORMAL HIGH (ref 70–99)
Glucose-Capillary: 155 mg/dL — ABNORMAL HIGH (ref 70–99)

## 2014-07-29 LAB — TISSUE CULTURE: Gram Stain: NONE SEEN

## 2014-07-29 LAB — ANAEROBIC CULTURE: GRAM STAIN: NONE SEEN

## 2014-07-29 LAB — CBC
HEMATOCRIT: 29.7 % — AB (ref 39.0–52.0)
Hemoglobin: 9.6 g/dL — ABNORMAL LOW (ref 13.0–17.0)
MCH: 31.1 pg (ref 26.0–34.0)
MCHC: 32.3 g/dL (ref 30.0–36.0)
MCV: 96.1 fL (ref 78.0–100.0)
PLATELETS: 256 10*3/uL (ref 150–400)
RBC: 3.09 MIL/uL — ABNORMAL LOW (ref 4.22–5.81)
RDW: 21.3 % — ABNORMAL HIGH (ref 11.5–15.5)
WBC: 9.6 10*3/uL (ref 4.0–10.5)

## 2014-07-29 LAB — POTASSIUM: POTASSIUM: 3.5 meq/L — AB (ref 3.7–5.3)

## 2014-07-29 LAB — FERRITIN: Ferritin: 684 ng/mL — ABNORMAL HIGH (ref 22–322)

## 2014-07-29 MED ORDER — POTASSIUM CHLORIDE CRYS ER 20 MEQ PO TBCR
40.0000 meq | EXTENDED_RELEASE_TABLET | Freq: Once | ORAL | Status: AC
  Administered 2014-07-29: 40 meq via ORAL
  Filled 2014-07-29: qty 2

## 2014-07-29 MED ORDER — FOLIC ACID 1 MG PO TABS
2.0000 mg | ORAL_TABLET | Freq: Every day | ORAL | Status: DC
  Administered 2014-07-29 – 2014-08-27 (×30): 2 mg via ORAL
  Filled 2014-07-29 (×32): qty 2

## 2014-07-29 MED ORDER — ENOXAPARIN SODIUM 30 MG/0.3ML ~~LOC~~ SOLN
30.0000 mg | SUBCUTANEOUS | Status: DC
Start: 2014-07-30 — End: 2014-08-09
  Administered 2014-07-30 – 2014-08-09 (×11): 30 mg via SUBCUTANEOUS
  Filled 2014-07-29 (×13): qty 0.3

## 2014-07-29 MED ORDER — FUROSEMIDE 10 MG/ML IJ SOLN
15.0000 mg/h | INTRAVENOUS | Status: DC
  Administered 2014-07-29 – 2014-07-30 (×3): 15 mg/h via INTRAVENOUS
  Filled 2014-07-29 (×6): qty 25

## 2014-07-29 MED ORDER — ENOXAPARIN SODIUM 30 MG/0.3ML ~~LOC~~ SOLN: 30.0000 mg | SUBCUTANEOUS | Status: DC

## 2014-07-29 MED ORDER — SODIUM CHLORIDE 0.9 % IJ SOLN
10.0000 mL | Freq: Two times a day (BID) | INTRAMUSCULAR | Status: DC
  Administered 2014-07-29: 10 mL

## 2014-07-29 MED ORDER — SODIUM CHLORIDE 0.9 % IJ SOLN
10.0000 mL | INTRAMUSCULAR | Status: DC | PRN
  Administered 2014-07-30: 20 mL

## 2014-07-29 MED ORDER — POTASSIUM CHLORIDE 10 MEQ/50ML IV SOLN
10.0000 meq | INTRAVENOUS | Status: AC
  Administered 2014-07-29 (×3): 10 meq via INTRAVENOUS
  Filled 2014-07-29 (×3): qty 50

## 2014-07-29 NOTE — Progress Notes (Signed)
Mr. Kendrick continues to improve slowly. His renal function has leveled off. He has good urine output. He has had 2 of the chest tubes removed.  His appetite is down quite a bit. He did not eat much yesterday. He still having some nausea. He is on scheduled Zofran. Phenergan was added.  His platelet count continues to stabilize. It was 256,000 today. His hemoglobin was 9.6.  His BUN and creatinine were 67 and 3.9. Potassium was down a little bit at 3.1.  He still is on a dopamine drip. It's a very low dose. Hopefully, he can get off this today.  His vital signs are stable. Blood pressure 122/62. Pulse 92. Temperature 98.1. His lungs are with some decrease at the bases. Occasional wheezing is noted. Cardiac exam regular rate and rhythm. Abdomen is soft. Bowel sounds are slightly decreased. Extremities shows 2+ edema.  Again, his platelet count has normalized. His prednisone dose is now down to 10 mg. We will try to get him tapered off this week.  His hemoglobin, I think, will take a while before it gets better. I don't think we have to do anything else right now. I will put him on some folic acid. This certainly may help. I will check some iron studies on him.  Pete e.

## 2014-07-29 NOTE — Progress Notes (Signed)
[facility] for Infectious Disease  Date of Admission:  07/05/2014  Antibiotics: Ampicillin ceftriaxone  Subjective: No acute events  Objective: Temp:  [97.3 F (36.3 C)-98.1 F (36.7 C)] 98.1 F (36.7 C) (08/26 1151) Pulse Rate:  [59-96] 96 (08/26 1200) Resp:  [14-24] 21 (08/26 1200) BP: (111-135)/(56-74) 131/68 mmHg (08/26 1200) SpO2:  [91 %-100 %] 97 % (08/26 1200) Weight:  [214 lb 1.1 oz (97.1 kg)] 214 lb 1.1 oz (97.1 kg) (08/26 0500)  General: awake, alert, nad Skin: no rashes, sternal incision noted  Lungs: CTA B Cor: RRR Ext: no edema  Lab Results Lab Results  Component Value Date   WBC 9.6 07/29/2014   HGB 9.6* 07/29/2014   HCT 29.7* 07/29/2014   MCV 96.1 07/29/2014   PLT 256 07/29/2014    Lab Results  Component Value Date   CREATININE 3.90* 07/29/2014   BUN 67* 07/29/2014   NA 140 07/29/2014   K 3.1* 07/29/2014   CL 98 07/29/2014   CO2 23 07/29/2014    Lab Results  Component Value Date   ALT 35 07/28/2014   AST 74* 07/28/2014   ALKPHOS 102 07/28/2014   BILITOT 0.3 07/28/2014      Microbiology: Recent Results (from the past 240 hour(s))  SURGICAL PCR SCREEN     Status: None   Collection Time    07/21/14 11:05 AM      Result Value Ref Range Status   MRSA, PCR NEGATIVE  NEGATIVE Final   Staphylococcus aureus NEGATIVE  NEGATIVE Final   Comment:            The Xpert SA Assay (FDA     approved for NASAL specimens     in patients over 2 years of age),     is one component of     a comprehensive surveillance     program.  Test performance has     been validated by [employer] for patients greater     than or equal to 41 year old.     It is not intended     to diagnose infection nor to     guide or monitor treatment.  GRAM STAIN     Status: None   Collection Time    07/23/2014  9:36 AM      Result Value Ref Range Status   Specimen Description TISSUE   Final   Special Requests AORTIC VALVE VEGETATION HEART PT ON ZINACEF VANCO   Final   Gram  Stain     Final   Value: FEW WBC PRESENT,BOTH PMN AND MONONUCLEAR     ABUNDANT GRAM POSITIVE COCCI IN PAIRS   Report Status 07/12/2014 FINAL   Final  TISSUE CULTURE     Status: None   Collection Time    07/06/2014  9:36 AM      Result Value Ref Range Status   Specimen Description TISSUE OTHER   Final   Special Requests AORTIC VALVE VEGETATION   Final   Gram Stain     Final   Value: NO WBC SEEN     NO SQUAMOUS EPITHELIAL CELLS SEEN     NO ORGANISMS SEEN     Performed at [facility]   Culture     Final   Value: FEW ENTEROCOCCUS SPECIES     Performed at [facility]   Report Status 07/29/2014 FINAL   Final   Organism ID, Bacteria ENTEROCOCCUS SPECIES   Final  ANAEROBIC CULTURE     Status: None   Collection Time    07/20/2014  9:36 AM      Result Value Ref Range Status   Specimen Description TISSUE OTHER   Final   Special Requests AORTIC VALVE VEGETATION   Final   Gram Stain     Final   Value: NO WBC SEEN     NO SQUAMOUS EPITHELIAL CELLS SEEN     NO ORGANISMS SEEN     Performed at [facility]   Culture     Final   Value: NO ANAEROBES ISOLATED     Performed at [facility]   Report Status 07/29/2014 FINAL   Final    Studies/Results: US Renal  07/28/2014   CLINICAL DATA:  Acute renal failure.  EXAM: RENAL/URINARY TRACT ULTRASOUND COMPLETE  COMPARISON:  CT dated 07/09/2014.  FINDINGS: Right Kidney:  Length: 11.2 cm. 1.7 x 1.4 x 1.3 cm exophytic hypoechoic mass arising from the mid to upper right kidney, laterally. This measured 20 Hounsfield units on the recent noncontrast CT examination. Otherwise, normal appearing right kidney without hydronephrosis.  Left Kidney:  Length: 10.5 cm. Echogenicity within normal limits. No mass or hydronephrosis visualized.  Bladder:  Nondistended, with a Foley catheter in place.  A small amount of free peritoneal fluid is noted.  IMPRESSION: 1. No hydronephrosis. 2. 1.7 cm hypoechoic mass rising from the mid upper right  kidney, laterally. Again, this most likely represents a proteinaceous cyst. A solid mass is less likely. 3. Small amount of ascites.   Electronically Signed   By: Enrique Sack M.D.   On: 07/28/2014 00:47   Dg Chest Port 1 View  07/28/2014   CLINICAL DATA:  Aortic root replacement and CABG  EXAM: PORTABLE CHEST - 1 VIEW  COMPARISON:  Prior chest x-ray 07/27/2014  FINDINGS: Stable position of right IJ vascular sheath and right upper extremity PICC. Catheter tips are within the SVC. Mediastinal and bilateral chest tubes are also unchanged. Patient is status post median sternotomy with evidence of aortic valve replacement, ligation of the left atrial appendage and multivessel CABG. Stable cardiac and mediastinal contours. Atherosclerotic calcification in the transverse aorta. Similar to incrementally increased patchy opacity in the periphery of the left mid lung. No pneumothorax, significant pleural effusion or evidence of edema.  IMPRESSION: 1. Stable and satisfactory support apparatus. 2. Similar to incrementally increased patchy opacity in the periphery of the left mid lung. Differential considerations include shifting atelectasis, focal alveolar hemorrhage, and potentially developing infiltrate. 3. Otherwise, unchanged appearance of the chest.   Electronically Signed   By: Jacqulynn Cadet M.D.   On: 07/28/2014 08:04    Assessment/Plan: 1) Enterococcal endocarditis s/p valve replacement - on dual beta lactam therapy with ampicillin and ceftriaxone.  Valve growing Enterococcus, amp sensitive.   Creat stable.  -will folllow up again Friday.    Justin [name], [facility] [name] [email or web address] [phone] pager   [phone] cell 07/29/2014, 2:26 PM

## 2014-07-29 NOTE — Progress Notes (Signed)
Physical Therapy Treatment Patient Details Name: Justin [name] MRN: [id number] DOB: [birth year] Today's Date: 2014-08-18    History of Present Illness Pt is a 80 y.o. male with history of CAD status post stents, hypertension, dyslipidemia, a-fib, pacemaker, ITP currently being treated with IVIG, who presents to the emergency department with complaints of fever and shortness of breath. Pt found to have bacterial endocarditis and underwent MVR, Aortic root replacement, and CABG on 07/15/2014.    PT Comments    Pt making slow, steady progress.  Follow Up Recommendations  SNF     Equipment Recommendations  Other (comment) (rollator)    Recommendations for Other Services       Precautions / Restrictions Precautions Precautions: Sternal;Fall Restrictions Weight Bearing Restrictions: Yes (sternal precautions)    Mobility  Bed Mobility                  Transfers Overall transfer level: Needs assistance Equipment used: Pushed w/c Transfers: Sit to/from Stand Sit to Stand: +2 physical assistance;Mod assist         General transfer comment: Verbal cues for hand placement and assist to bring up hips.  Ambulation/Gait Ambulation/Gait assistance: Min assist;+2 safety/equipment Ambulation Distance (Feet): 50 Feet Assistive device:  (pushing w/c) Gait Pattern/deviations: Step-through pattern;Decreased step length - right;Decreased step length - left;Shuffle;Trunk flexed Gait velocity: Decreased Gait velocity interpretation: Below normal speed for age/gender General Gait Details: Verbal cues to stand more erect.   Stairs            Wheelchair Mobility    Modified Rankin (Stroke Patients Only)       Balance Overall balance assessment: Needs assistance Sitting-balance support: Bilateral upper extremity supported Sitting balance-Leahy Scale: Poor     Standing balance support: Bilateral upper extremity supported Standing balance-Leahy Scale: Poor Standing  balance comment: Support of w/c and 1 person to maintain.                    Cognition Arousal/Alertness: Awake/alert Behavior During Therapy: WFL for tasks assessed/performed Overall Cognitive Status: Within Functional Limits for tasks assessed                      Exercises      General Comments        Pertinent Vitals/Pain Pain Assessment: No/denies pain    Home Living                      Prior Function            PT Goals (current goals can now be found in the care plan section) Progress towards PT goals: Progressing toward goals    Frequency  Min 3X/week    PT Plan Current plan remains appropriate    Co-evaluation             End of Session Equipment Utilized During Treatment: Oxygen Activity Tolerance: Patient limited by fatigue Patient left: in chair;with call bell/phone within reach;with family/visitor present     Time: 8676-1950 PT Time Calculation (min): 23 min  Charges:  $Gait Training: 23-37 mins                    G Codes:      Falen Lehrmann Aug 18, 2014, 10:28 AM  Suanne Marker PT [phone]

## 2014-07-29 NOTE — Progress Notes (Signed)
I have personally seen and examined this patient and agree with the assessment/plan as outlined above by Hoffman DO.(PGY2) Would not use lasix drip beyond 24 hours and recommend conversion to PO lasix tomorrow to avoid intravascular volume contraction. Renal function stable and hypokalemia repleted-- will recheck this PM  Vlasta Baskin K.,MD 07/29/2014 10:14 AM

## 2014-07-29 NOTE — Progress Notes (Signed)
CT surgery p.m. Rounds  Urine output continues to improve on Lasix drip-greater than 100 cc per hour Patient stronger, walking the hallway twice today Hemodynamic stable on renal dose dopamine Afebrile, incisions clean

## 2014-07-29 NOTE — Progress Notes (Signed)
S:No acute events overnight, good urine output, patient reports some generalized weakness but appetite improving. O:BP 126/64  Pulse 92  Temp(Src) 98 F (36.7 C) (Oral)  Resp 24  Ht 5\' 11"  (1.803 m)  Wt 214 lb 1.1 oz (97.1 kg)  BMI 29.87 kg/m2  SpO2 99%  Intake/Output Summary (Last 24 hours) at 07/29/14 0814 Last data filed at 07/29/14 0800  Gross per 24 hour  Intake 1347.2 ml  Output   3945 ml  Net -2597.8 ml   Intake/Output: I/O last 3 completed shifts: In: 1654.8 [P.O.:440; I.V.:552.8; IV Piggyback:662] Out: 4010 [UVOZD:6644; Chest Tube:1300]  Intake/Output this shift:  Total I/O In: 64.8 [I.V.:14.8; IV Piggyback:50] Out: 180 [Urine:100; Chest Tube:80] Weight change: 0 lb (0 kg) IHK:VQQVZDG in chair in NAD eating breakfast CVS:RRR Resp:bibasilar crackles Abd:+ BS, soft, NT Ext:2+ pedal edema to knees   Recent Labs Lab 07/23/14 0530  07/25/14 0915  07/26/14 0353 07/26/14 1725 07/27/14 0400 07/27/14 1619 07/27/14 2245 07/28/14 0432 07/28/14 1400 07/29/14 0414  NA 140  < > 137  < > 133* 134* 131* 135* 135* 135* 135* 140  K 4.0  < > 5.2  < > 5.2 4.6 5.8* 5.9* 5.1 4.6 4.0 3.1*  CL 97  < > 99  < > 96 103 94* 96 95* 96 95* 98  CO2 32  < > 22  --  20 17* 18* 21 21 21 22 23   GLUCOSE 76  < > 184*  < > 131* 94 124* 83 109* 95 122* 89  BUN 33*  < > 27*  < > 37* 38* 50* 58* 59* 62* 66* 67*  CREATININE 1.70*  < > 1.74*  < > 2.67* 2.69* 3.68* 3.76* 4.15* 4.11* 4.04* 3.90*  ALBUMIN 2.3*  --  3.1*  --  2.8*  --  2.6*  --   --  2.4*  --  2.4*  CALCIUM 8.6  < > 7.5*  --  7.5* 6.1* 7.7* 7.6* 7.6* 7.7* 7.5* 8.0*  PHOS  --   --   --   --   --   --   --   --   --   --   --  7.3*  AST 48*  --  135*  --  138*  --  109*  --   --  74*  --   --   ALT 31  --  26  --  31  --  36  --   --  35  --   --   < > = values in this interval not displayed. Liver Function Tests:  Recent Labs Lab 07/26/14 0353 07/27/14 0400 07/28/14 0432 07/29/14 0414  AST 138* 109* 74*  --   ALT 31 36  35  --   ALKPHOS 48 69 102  --   BILITOT 0.6 0.4 0.3  --   PROT 5.2* 5.7* 5.6*  --   ALBUMIN 2.8* 2.6* 2.4* 2.4*   No results found for this basename: LIPASE, AMYLASE,  in the last 168 hours No results found for this basename: AMMONIA,  in the last 168 hours CBC:  Recent Labs Lab 07/25/14 1700  07/26/14 0353 07/27/14 0400 07/28/14 0432 07/29/14 0414  WBC 9.7  --  11.4* 10.5 9.6 9.6  HGB 9.3*  < > 9.8* 9.8* 9.2* 9.6*  HCT 27.0*  < > 29.1* 29.6* 28.1* 29.7*  MCV 90.9  --  94.2 96.7 94.9 96.1  PLT 188  --  PLATELET CLUMPS  NOTED ON SMEAR, COUNT APPEARS ADEQUATE PLATELET CLUMPS NOTED ON SMEAR, COUNT APPEARS ADEQUATE 231 256  < > = values in this interval not displayed. Cardiac Enzymes: No results found for this basename: CKTOTAL, CKMB, CKMBINDEX, TROPONINI,  in the last 168 hours CBG:  Recent Labs Lab 07/28/14 1200 07/28/14 1559 07/28/14 1923 07/28/14 2351 07/29/14 0339  GLUCAP 157* 108* 112* 108* 86    Iron Studies: No results found for this basename: IRON, TIBC, TRANSFERRIN, FERRITIN,  in the last 72 hours Studies/Results: US Renal  07/28/2014   CLINICAL DATA:  Acute renal failure.  EXAM: RENAL/URINARY TRACT ULTRASOUND COMPLETE  COMPARISON:  CT dated 07/09/2014.  FINDINGS: Right Kidney:  Length: 11.2 cm. 1.7 x 1.4 x 1.3 cm exophytic hypoechoic mass arising from the mid to upper right kidney, laterally. This measured 20 Hounsfield units on the recent noncontrast CT examination. Otherwise, normal appearing right kidney without hydronephrosis.  Left Kidney:  Length: 10.5 cm. Echogenicity within normal limits. No mass or hydronephrosis visualized.  Bladder:  Nondistended, with a Foley catheter in place.  A small amount of free peritoneal fluid is noted.  IMPRESSION: 1. No hydronephrosis. 2. 1.7 cm hypoechoic mass rising from the mid upper right kidney, laterally. Again, this most likely represents a proteinaceous cyst. A solid mass is less likely. 3. Small amount of ascites.    Electronically Signed   By: Enrique Sack M.D.   On: 07/28/2014 00:47   Dg Chest Port 1 View  07/28/2014   CLINICAL DATA:  Aortic root replacement and CABG  EXAM: PORTABLE CHEST - 1 VIEW  COMPARISON:  Prior chest x-ray 07/27/2014  FINDINGS: Stable position of right IJ vascular sheath and right upper extremity PICC. Catheter tips are within the SVC. Mediastinal and bilateral chest tubes are also unchanged. Patient is status post median sternotomy with evidence of aortic valve replacement, ligation of the left atrial appendage and multivessel CABG. Stable cardiac and mediastinal contours. Atherosclerotic calcification in the transverse aorta. Similar to incrementally increased patchy opacity in the periphery of the left mid lung. No pneumothorax, significant pleural effusion or evidence of edema.  IMPRESSION: 1. Stable and satisfactory support apparatus. 2. Similar to incrementally increased patchy opacity in the periphery of the left mid lung. Differential considerations include shifting atelectasis, focal alveolar hemorrhage, and potentially developing infiltrate. 3. Otherwise, unchanged appearance of the chest.   Electronically Signed   By: Jacqulynn Cadet M.D.   On: 07/28/2014 08:04   . acetaminophen  1,000 mg Oral 4 times per day  . ampicillin (OMNIPEN) IV  2 g Intravenous 3 times per day  . antiseptic oral rinse  7 mL Mouth Rinse QID  . aspirin EC  81 mg Oral Daily  . bisacodyl  10 mg Oral Daily   Or  . bisacodyl  10 mg Rectal Daily  . cefTRIAXone (ROCEPHIN)  IV  2 g Intravenous Q12H  . chlorhexidine  15 mL Mouth Rinse BID  . docusate sodium  200 mg Oral Daily  . feeding supplement (ENSURE COMPLETE)  237 mL Oral BID BM  . feeding supplement (PRO-STAT SUGAR FREE 64)  30 mL Oral BID WC  . folic acid  2 mg Oral Daily  . insulin aspart  0-24 Units Subcutaneous 6 times per day  . ondansetron (ZOFRAN) IV  8 mg Intravenous 3 times per day  . pantoprazole  40 mg Oral BID  . potassium chloride  10  mEq Intravenous Q1 Hr x 3  . predniSONE  10 mg  Oral Q breakfast  . sodium chloride  10-40 mL Intravenous Q12H  . sodium chloride  3 mL Intravenous Q12H    BMET    Component Value Date/Time   NA 140 07/29/2014 0414   NA 134 06/24/2014 1355   K 3.1* 07/29/2014 0414   K 3.4 06/24/2014 1355   CL 98 07/29/2014 0414   CL 98 06/24/2014 1355   CO2 23 07/29/2014 0414   CO2 30 06/24/2014 1355   GLUCOSE 89 07/29/2014 0414   GLUCOSE 124* 06/24/2014 1355   BUN 67* 07/29/2014 0414   BUN 34* 06/24/2014 1355   CREATININE 3.90* 07/29/2014 0414   CREATININE 1.5* 06/24/2014 1355   CALCIUM 8.0* 07/29/2014 0414   CALCIUM 8.8 06/24/2014 1355   GFRNONAA 13* 07/29/2014 0414   GFRNONAA 50* 06/24/2014 1320   GFRAA 16* 07/29/2014 0414   GFRAA 57* 06/24/2014 1320   CBC    Component Value Date/Time   WBC 9.6 07/29/2014 0414   WBC 14.0* 07/15/2014 0833   RBC 3.09* 07/29/2014 0414   RBC 2.91* 07/12/2014 0430   RBC 2.98* 07/05/2014 0833   HGB 9.6* 07/29/2014 0414   HGB 9.5* 07/25/2014 0833   HCT 29.7* 07/29/2014 0414   HCT 28.8* 07/30/2014 0833   PLT 256 07/29/2014 0414   PLT 10* 07/08/2014 0833   MCV 96.1 07/29/2014 0414   MCV 97 07/15/2014 0833   MCH 31.1 07/29/2014 0414   MCH 31.9 07/09/2014 0833   MCHC 32.3 07/29/2014 0414   MCHC 33.0 08/02/2014 0833   RDW 21.3* 07/29/2014 0414   RDW 14.8 07/26/2014 0833   LYMPHSABS 1.0 07/08/2014 0235   LYMPHSABS 0.8* 07/28/2014 0833   MONOABS 0.7 07/08/2014 0235   EOSABS 0.0 07/08/2014 0235   EOSABS 0.0 07/16/2014 0833   BASOSABS 0.0 07/08/2014 0235   BASOSABS 0.0 07/20/2014 0833   Renal U/S: No hydronephrosis, 1.7 cm hypoechoic mass on mid upper right kidney- likely proteinaceous cyst, small amount of ascites.  Assessment/Plan:  1. Acute on CKD stage 2 Renal Failure: Baseline Scr ~1.2.  Patient net negative 2.4L yesterday with IV lasix 160 in AM and 120 in PM.  Renal function improved slightly this morning.  Lasix drip started this AM by CVTS. 2. Hypokalemia- due to lasix.  3 runs of 63mEq IV potassium  ordered. 3. AG metabolic acidosis: secondary to #1.  Improved today with bicarb of 21. 4. Anemia: Heme following, Hgb stable, has received Aranesp. Adding folic acid and iron studies 5. Thrombocytopenia: Improved, Heme following.. 6. Enterococcus endocarditis: s/p MVR, Aortic root replacement, CABG, ID following: dual B lactam therapy with ampicillin and ceftriaxone.  Lucious Groves, DO  [facility], PGY2

## 2014-07-29 NOTE — Progress Notes (Signed)
[address]       ,[place] [address]             [phone]        CARDIOTHORACIC SURGERY PROGRESS NOTE   R5 Days Post-Op Procedure(s) (LRB): ROOT REPLACEMENT WITH BIOPROSTHETIC PORCINE AORTIC ROOT REIMPLANTATION OF LEFT MAIN AND RIGHT CORONARY ARTERIES (N/A) INTRAOPERATIVE TRANSESOPHAGEAL ECHOCARDIOGRAM (N/A) MAZE (N/A) CORONARY ARTERY BYPASS GRAFTING (CABG), on pump, times one, using right greater saphenous vein harvested endoscopically. (N/A) MITRAL VALVE (MV) REPLACEMENT (N/A)  Subjective: Feels pretty well.  Stomach still "queezy" but otherwise no complaints  Objective: Vital signs: BP Readings from Last 1 Encounters:  07/29/14 126/64   Pulse Readings from Last 1 Encounters:  07/29/14 92   Resp Readings from Last 1 Encounters:  07/29/14 24   Temp Readings from Last 1 Encounters:  07/29/14 98 F (36.7 C) Oral    Hemodynamics:    Physical Exam:  Rhythm:   Sinus w/ 3rd degree AV block, appropriately DDD pacing  Breath sounds: clear  Heart sounds:  RRR  Incisions:  Clean and dry  Abdomen:  Soft, non-distended, non-tender  Extremities:  Warm, well-perfused, swollen    Intake/Output from previous day: 08/25 0701 - 08/26 0700 In: 1427.2 [P.O.:440; I.V.:475.2; IV Piggyback:512] Out: 3905 [Urine:3165; Chest Tube:740] Intake/Output this shift: Total I/O In: 64.8 [I.V.:14.8; IV Piggyback:50] Out: 180 [Urine:100; Chest Tube:80]  Lab Results:  CBC: Recent Labs  07/28/14 0432 07/29/14 0414  WBC 9.6 9.6  HGB 9.2* 9.6*  HCT 28.1* 29.7*  PLT 231 256    BMET:  Recent Labs  07/28/14 1400 07/29/14 0414  NA 135* 140  K 4.0 3.1*  CL 95* 98  CO2 22 23  GLUCOSE 122* 89  BUN 66* 67*  CREATININE 4.04* 3.90*  CALCIUM 7.5* 8.0*     CBG (last 3)   Recent Labs  07/28/14 2351 07/29/14 0339 07/29/14 0744  GLUCAP 108* 86 85    ABG    Component Value Date/Time   PHART 7.395 07/25/2014 2059   PCO2ART 31.8* 07/25/2014 2059   PO2ART 65.0* 07/25/2014 2059   HCO3 19.5* 07/25/2014 2059   TCO2 20 07/25/2014 2059   ACIDBASEDEF 5.0* 07/25/2014 2059   O2SAT 93.0 07/25/2014 2059    CXR: n/a  Assessment/Plan: S/P Procedure(s) (LRB): ROOT REPLACEMENT WITH BIOPROSTHETIC PORCINE AORTIC ROOT REIMPLANTATION OF LEFT MAIN AND RIGHT CORONARY ARTERIES (N/A) INTRAOPERATIVE TRANSESOPHAGEAL ECHOCARDIOGRAM (N/A) MAZE (N/A) CORONARY ARTERY BYPASS GRAFTING (CABG), on pump, times one, using right greater saphenous vein harvested endoscopically. (N/A) MITRAL VALVE (MV) REPLACEMENT (N/A)  Overall doing fairly well POD5 Bacterial endocarditis s/p AVR+MVR - OR cultures growing Enterococcus species - now on Ampicillin + Rocephin Hemodynamically stable off all drips except renal-dose dopamine Remains pacer-dependent but escape rhythm much improved, temporary V-wires working well Pre-op h/o symptomatic bradycardia, s/p removal of pre-existing perm pacer Acute renal failure on underlying chronic kidney disease, non-oliguric w/ good diuresis last 24 hours, creatinine starting to trend down Expected post op volume excess, severe Expected post op acute blood loss anemia, mild, stable Expected post op atelectasis, mild, L>R Pre op thrombocytopenia, platelet count now normal, no signs of bleeding Protein-depleted malnutrition, pre and post-op Pre-op high-dose steroids, now tapering Prednisone     Will start lasix drip, watch potassium closely, keep Foley in place to monitor UOP   Continue renal dose dopamine for now  Keep back-up pacer at bedside, watch V-wire thresholds - ultimately will need perm pacer replaced -  perhaps by the end of this week  Keep pleural tubes until dry  Mobilize as much as possible  PT consult  Encourage po's and add supplements  Taper Prednisone  Antibiotics per ID team - f/u culture results  Watch platelet count, anemia  Add low dose lovenox for DVT prophylaxis and watch platelet count, continue ASA 81  mg/day for now - ultimately will need Coumadin   Lerin Jech H 07/29/2014 8:50 AM

## 2014-07-30 ENCOUNTER — Inpatient Hospital Stay (HOSPITAL_COMMUNITY): Payer: Medicare Other

## 2014-07-30 LAB — CBC
HCT: 30.6 % — ABNORMAL LOW (ref 39.0–52.0)
Hemoglobin: 10.2 g/dL — ABNORMAL LOW (ref 13.0–17.0)
MCH: 32.9 pg (ref 26.0–34.0)
MCHC: 33.3 g/dL (ref 30.0–36.0)
MCV: 98.7 fL (ref 78.0–100.0)
Platelets: 272 10*3/uL (ref 150–400)
RBC: 3.1 MIL/uL — AB (ref 4.22–5.81)
RDW: 21.6 % — AB (ref 11.5–15.5)
WBC: 12.4 10*3/uL — ABNORMAL HIGH (ref 4.0–10.5)

## 2014-07-30 LAB — BASIC METABOLIC PANEL
ANION GAP: 17 — AB (ref 5–15)
Anion gap: 17 — ABNORMAL HIGH (ref 5–15)
BUN: 64 mg/dL — AB (ref 6–23)
BUN: 62 mg/dL — ABNORMAL HIGH (ref 6–23)
CHLORIDE: 96 meq/L (ref 96–112)
CO2: 26 meq/L (ref 19–32)
CO2: 25 mEq/L (ref 19–32)
Calcium: 8.2 mg/dL — ABNORMAL LOW (ref 8.4–10.5)
Calcium: 7.6 mg/dL — ABNORMAL LOW (ref 8.4–10.5)
Chloride: 96 mEq/L (ref 96–112)
Creatinine, Ser: 3.42 mg/dL — ABNORMAL HIGH (ref 0.50–1.35)
Creatinine, Ser: 3.24 mg/dL — ABNORMAL HIGH (ref 0.50–1.35)
GFR calc non Af Amer: 16 mL/min — ABNORMAL LOW (ref >90)
GFR calc non Af Amer: 17 mL/min — ABNORMAL LOW (ref >90)
GFR, EST AFRICAN AMERICAN: 18 mL/min — AB (ref >90)
GFR, EST AFRICAN AMERICAN: 19 mL/min — AB (ref >90)
Glucose, Bld: 116 mg/dL — ABNORMAL HIGH (ref 70–99)
Glucose, Bld: 142 mg/dL — ABNORMAL HIGH (ref 70–99)
POTASSIUM: 3 meq/L — AB (ref 3.7–5.3)
Potassium: 3.2 mEq/L — ABNORMAL LOW (ref 3.7–5.3)
Sodium: 139 mEq/L (ref 137–147)
Sodium: 138 mEq/L (ref 137–147)

## 2014-07-30 LAB — POCT I-STAT 4, (NA,K, GLUC, HGB,HCT)
Glucose, Bld: 104 mg/dL — ABNORMAL HIGH (ref 70–99)
HCT: 34 % — ABNORMAL LOW (ref 39.0–52.0)
Hemoglobin: 11.6 g/dL — ABNORMAL LOW (ref 13.0–17.0)
Potassium: 3.3 mEq/L — ABNORMAL LOW (ref 3.7–5.3)
SODIUM: 138 meq/L (ref 137–147)

## 2014-07-30 MED ORDER — DOPAMINE-DEXTROSE 3.2-5 MG/ML-% IV SOLN: 0.0000 ug/kg/min | INTRAVENOUS | Status: DC

## 2014-07-30 MED ORDER — POTASSIUM CHLORIDE 10 MEQ/50ML IV SOLN
10.0000 meq | INTRAVENOUS | Status: DC | PRN
  Filled 2014-07-30 (×2): qty 50

## 2014-07-30 MED ORDER — POTASSIUM CHLORIDE 10 MEQ/50ML IV SOLN
10.0000 meq | INTRAVENOUS | Status: AC
  Administered 2014-07-31 (×5): 10 meq via INTRAVENOUS
  Filled 2014-07-30 (×5): qty 50

## 2014-07-30 MED ORDER — POTASSIUM CHLORIDE 10 MEQ/50ML IV SOLN
INTRAVENOUS | Status: AC
  Administered 2014-07-30: 10 meq
  Filled 2014-07-30: qty 50

## 2014-07-30 MED ORDER — POTASSIUM CHLORIDE 10 MEQ/50ML IV SOLN
10.0000 meq | INTRAVENOUS | Status: AC
  Administered 2014-07-30 (×2): 10 meq via INTRAVENOUS
  Filled 2014-07-30 (×2): qty 50

## 2014-07-30 MED ORDER — POTASSIUM CHLORIDE 10 MEQ/50ML IV SOLN
10.0000 meq | INTRAVENOUS | Status: AC
  Administered 2014-07-30 (×3): 10 meq via INTRAVENOUS
  Filled 2014-07-30: qty 50

## 2014-07-30 MED ORDER — FERUMOXYTOL INJECTION 510 MG/17 ML
1020.0000 mg | Freq: Once | INTRAVENOUS | Status: AC
  Administered 2014-07-30: 1020 mg via INTRAVENOUS
  Filled 2014-07-30: qty 34

## 2014-07-30 MED ORDER — POTASSIUM CHLORIDE 10 MEQ/50ML IV SOLN: 10.0000 meq | INTRAVENOUS | Status: DC

## 2014-07-30 NOTE — Progress Notes (Signed)
S:No acute events overnight, continues to have good urine output.  Patient reports he feels things are going well and has no complaints.  Had some difficulty with PO KDur yesterday O:BP 122/61  Pulse 97  Temp(Src) 97.4 F (36.3 C) (Oral)  Resp 20  Ht 5\' 11"  (1.803 m)  Wt 213 lb 6.5 oz (96.8 kg)  BMI 29.78 kg/m2  SpO2 96%  Intake/Output Summary (Last 24 hours) at 07/30/14 0818 Last data filed at 07/30/14 0700  Gross per 24 hour  Intake 1788.1 ml  Output   4045 ml  Net -2256.9 ml   Intake/Output: I/O last 3 completed shifts: In: 2380.5 [P.O.:720; I.V.:810.5; IV Piggyback:850] Out: 5009 [Urine:5400; Chest Tube:870]  Intake/Output this shift:    Weight change: -10.6 oz (-0.3 kg) FGH:WEXHBZJ in chair in NAD CVS:RRR Resp:bibasilar crackles Abd:+ BS, soft, NT Ext:2+ pedal edema to knees   Recent Labs Lab 07/25/14 0915  07/26/14 0353  07/27/14 0400 07/27/14 1619 07/27/14 2245 07/28/14 0432 07/28/14 1400 07/29/14 0414 07/29/14 1347 07/30/14 0415  NA 137  < > 133*  < > 131* 135* 135* 135* 135* 140  --  139  K 5.2  < > 5.2  < > 5.8* 5.9* 5.1 4.6 4.0 3.1* 3.5* 3.0*  CL 99  < > 96  < > 94* 96 95* 96 95* 98  --  96  CO2 22  --  20  < > 18* 21 21 21 22 23   --  26  GLUCOSE 184*  < > 131*  < > 124* 83 109* 95 122* 89  --  116*  BUN 27*  < > 37*  < > 50* 58* 59* 62* 66* 67*  --  64*  CREATININE 1.74*  < > 2.67*  < > 3.68* 3.76* 4.15* 4.11* 4.04* 3.90*  --  3.42*  ALBUMIN 3.1*  --  2.8*  --  2.6*  --   --  2.4*  --  2.4*  --   --   CALCIUM 7.5*  --  7.5*  < > 7.7* 7.6* 7.6* 7.7* 7.5* 8.0*  --  8.2*  PHOS  --   --   --   --   --   --   --   --   --  7.3*  --   --   AST 135*  --  138*  --  109*  --   --  74*  --   --   --   --   ALT 26  --  31  --  36  --   --  35  --   --   --   --   < > = values in this interval not displayed. Liver Function Tests:  Recent Labs Lab 07/26/14 0353 07/27/14 0400 07/28/14 0432 07/29/14 0414  AST 138* 109* 74*  --   ALT 31 36 35  --    ALKPHOS 48 69 102  --   BILITOT 0.6 0.4 0.3  --   PROT 5.2* 5.7* 5.6*  --   ALBUMIN 2.8* 2.6* 2.4* 2.4*   No results found for this basename: LIPASE, AMYLASE,  in the last 168 hours No results found for this basename: AMMONIA,  in the last 168 hours CBC:  Recent Labs Lab 07/26/14 0353 07/27/14 0400 07/28/14 0432 07/29/14 0414 07/30/14 0415  WBC 11.4* 10.5 9.6 9.6 12.4*  HGB 9.8* 9.8* 9.2* 9.6* 10.2*  HCT 29.1* 29.6* 28.1* 29.7* 30.6*  MCV 94.2 96.7  94.9 96.1 98.7  PLT PLATELET CLUMPS NOTED ON SMEAR, COUNT APPEARS ADEQUATE PLATELET CLUMPS NOTED ON SMEAR, COUNT APPEARS ADEQUATE 231 256 272   Cardiac Enzymes: No results found for this basename: CKTOTAL, CKMB, CKMBINDEX, TROPONINI,  in the last 168 hours CBG:  Recent Labs Lab 07/29/14 0339 07/29/14 0744 07/29/14 1149 07/29/14 1613 07/29/14 1925  GLUCAP 86 85 195* 129* 155*    Iron Studies:   Recent Labs  07/29/14 0815  IRON 28*  TIBC 155*  FERRITIN 684*   Studies/Results: Dg Chest Port 1 View  07/30/2014   CLINICAL DATA:  follow-up CHF  EXAM: PORTABLE CHEST - 1 VIEW  COMPARISON:  07/28/2014  FINDINGS: Previous CABG, valve replacement surgery, and left apical clip placement. Bilateral chest tubes in place with no pneumothorax. Right PICC line stable. Right IJ central line has been removed. Mediastinal drain has been removed. Patchy airspace opacities in mid lung zones left greater than right persist. No definite effusion although the left lateral costophrenic angle is obscured by overlying monitoring hardware. Heart size remains normal. Atheromatous aorta.  IMPRESSION: 1. Removal of right IJ central line and mediastinal drain. Otherwise stable appearance since previous exam.   Electronically Signed   By: Arne Cleveland M.D.   On: 07/30/2014 07:53   . ampicillin (OMNIPEN) IV  2 g Intravenous 3 times per day  . antiseptic oral rinse  7 mL Mouth Rinse QID  . aspirin EC  81 mg Oral Daily  . bisacodyl  10 mg Oral Daily    Or  . bisacodyl  10 mg Rectal Daily  . cefTRIAXone (ROCEPHIN)  IV  2 g Intravenous Q12H  . chlorhexidine  15 mL Mouth Rinse BID  . docusate sodium  200 mg Oral Daily  . enoxaparin (LOVENOX) injection  30 mg Subcutaneous Q24H  . feeding supplement (ENSURE COMPLETE)  237 mL Oral BID BM  . feeding supplement (PRO-STAT SUGAR FREE 64)  30 mL Oral BID WC  . ferumoxytol  1,020 mg Intravenous Once  . folic acid  2 mg Oral Daily  . ondansetron (ZOFRAN) IV  8 mg Intravenous 3 times per day  . pantoprazole  40 mg Oral BID  . potassium chloride  10 mEq Intravenous Q1 Hr x 3  . sodium chloride  10-40 mL Intravenous Q12H  . sodium chloride  10-40 mL Intracatheter Q12H  . sodium chloride  3 mL Intravenous Q12H    BMET    Component Value Date/Time   NA 139 07/30/2014 0415   NA 134 06/24/2014 1355   K 3.0* 07/30/2014 0415   K 3.4 06/24/2014 1355   CL 96 07/30/2014 0415   CL 98 06/24/2014 1355   CO2 26 07/30/2014 0415   CO2 30 06/24/2014 1355   GLUCOSE 116* 07/30/2014 0415   GLUCOSE 124* 06/24/2014 1355   BUN 64* 07/30/2014 0415   BUN 34* 06/24/2014 1355   CREATININE 3.42* 07/30/2014 0415   CREATININE 1.5* 06/24/2014 1355   CALCIUM 8.2* 07/30/2014 0415   CALCIUM 8.8 06/24/2014 1355   GFRNONAA 16* 07/30/2014 0415   GFRNONAA 50* 06/24/2014 1320   GFRAA 18* 07/30/2014 0415   GFRAA 57* 06/24/2014 1320   CBC    Component Value Date/Time   WBC 12.4* 07/30/2014 0415   WBC 14.0* 07/24/2014 0833   RBC 3.10* 07/30/2014 0415   RBC 2.91* 07/12/2014 0430   RBC 2.98* 07/13/2014 0833   HGB 10.2* 07/30/2014 0415   HGB 9.5* 07/09/2014 0833   HCT 30.6* 07/30/2014  0415   HCT 28.8* 07/18/2014 0833   PLT 272 07/30/2014 0415   PLT 10* 07/26/2014 0833   MCV 98.7 07/30/2014 0415   MCV 97 07/19/2014 0833   MCH 32.9 07/30/2014 0415   MCH 31.9 07/11/2014 0833   MCHC 33.3 07/30/2014 0415   MCHC 33.0 07/09/2014 0833   RDW 21.6* 07/30/2014 0415   RDW 14.8 07/14/2014 0833   LYMPHSABS 1.0 07/08/2014 0235   LYMPHSABS 0.8* 07/27/2014 0833   MONOABS 0.7  07/08/2014 0235   EOSABS 0.0 07/08/2014 0235   EOSABS 0.0 07/10/2014 0833   BASOSABS 0.0 07/08/2014 0235   BASOSABS 0.0 07/05/2014 0833   Renal U/S: No hydronephrosis, 1.7 cm hypoechoic mass on mid upper right kidney- likely proteinaceous cyst, small amount of ascites.  Assessment/Plan:  1. Acute on CKD stage 2 Renal Failure: Baseline Scr ~1.2.  Patient net negative 2.3L yesterday with IV lasix drip.  Renal function continues to improve slowly.  Would recommend changing to PO lasix 120mg  BID to avoid intravascular contraction. 2. Hypokalemia- due to lasix.  3 runs of 70mEq IV potassium given this am.  Will need additional IV replacement. 3. AG metabolic acidosis: secondary to #1.  Imporved 4. Anemia: Heme following, Hgb stable, has received Aranesp. IV iron repeated today. 5. Thrombocytopenia: likely due to endocarditis now resolved. Prednisone discontinued. 6. Enterococcus endocarditis: s/p MVR, Aortic root replacement, CABG, ID following: dual B lactam therapy with ampicillin and ceftriaxone.  Lucious Groves, DO  [facility], PGY2

## 2014-07-30 NOTE — Progress Notes (Signed)
Patient ID: Justin [name], male   DOB: 1933/03/20, 80 y.o.   MRN: [id number]  SICU Evening Rounds:  Hemodynamically stable off dopamine  Diuresing well, creat dropping  BMET    Component Value Date/Time   NA 138 07/30/2014 1415   NA 134 06/24/2014 1355   K 3.2* 07/30/2014 1415   K 3.4 06/24/2014 1355   CL 96 07/30/2014 1415   CL 98 06/24/2014 1355   CO2 25 07/30/2014 1415   CO2 30 06/24/2014 1355   GLUCOSE 142* 07/30/2014 1415   GLUCOSE 124* 06/24/2014 1355   BUN 62* 07/30/2014 1415   BUN 34* 06/24/2014 1355   CREATININE 3.24* 07/30/2014 1415   CREATININE 1.5* 06/24/2014 1355   CALCIUM 7.6* 07/30/2014 1415   CALCIUM 8.8 06/24/2014 1355   GFRNONAA 17* 07/30/2014 1415   GFRNONAA 50* 06/24/2014 1320   GFRAA 19* 07/30/2014 1415   GFRAA 57* 06/24/2014 1320    Up in chair.   Replace K+.

## 2014-07-30 NOTE — Progress Notes (Signed)
I have personally seen and examined this patient and agree with the assessment/plan as outlined above by Heber Panama DO (PGY2). Improving UOP on lasix gtt---plans noted to decrease rate and DC dopamine gtt. Ongoing K repletion. Will continue to follow anticipating continued renal recovery. Lio Wehrly K.,MD 07/30/2014 10:06 AM

## 2014-07-30 NOTE — Progress Notes (Signed)
Mr. Fulghum is improving slowly. His renal function seems to be getting better. He's had a good urine output. He is on a Lasix drip.  He is out of bed. He does have the chest tubes in the right side.  His platelet continued to stabilize. His platelet count is 272,000 today. Her hemoglobin is 10.2. White cell count is 12.4.  I did run some iron studies. He is mildly iron deficient. His iron saturation is only 18%. I will go ahead and give him another dose of IV iron.  His appetite is down. He is trying to eat a a little more.  His vital signs are stable. Blood pressure 123/63. He is on renal dose dopamine. Heart rate 94. He is afebrile. Lungs sound fairly clear. Cardiac exam regular in rhythm. Abdomen is soft. He has no palpable liver or spleen. Extremities shows 1+ edema.  I have to believe that the endocarditis was the most likely source for his thrombocytopenia. He is off prednisone. I will stop this today.  He will continue on folic acid. He is on Lovenox. He is on aspirin which is low dose.  He really is doing better with his blood. I expect to see his anemia normalized as he gets further away from his surgery.  Alvy Beal 41:10

## 2014-07-30 NOTE — Progress Notes (Signed)
[address]       [place] [address]             [phone]        CARDIOTHORACIC SURGERY PROGRESS NOTE   R6 Days Post-Op Procedure(s) (LRB): ROOT REPLACEMENT WITH BIOPROSTHETIC PORCINE AORTIC ROOT REIMPLANTATION OF LEFT MAIN AND RIGHT CORONARY ARTERIES (N/A) INTRAOPERATIVE TRANSESOPHAGEAL ECHOCARDIOGRAM (N/A) MAZE (N/A) CORONARY ARTERY BYPASS GRAFTING (CABG), on pump, times one, using right greater saphenous vein harvested endoscopically. (N/A) MITRAL VALVE (MV) REPLACEMENT (N/A)  Subjective: Looks good and slowly feeling better.  Oral intake improving, although still marginal.  Taking Ensure supplements well.  Denies pain, SOB  Objective: Vital signs: BP Readings from Last 1 Encounters:  07/30/14 122/61   Pulse Readings from Last 1 Encounters:  07/30/14 97   Resp Readings from Last 1 Encounters:  07/30/14 20   Temp Readings from Last 1 Encounters:  07/30/14 98.2 F (36.8 C) Oral    Hemodynamics:    Physical Exam:  Rhythm:   Sinus tach w/ 3rd degree AV block, AV pacing appropriately  Breath sounds: clear  Heart sounds:  RRR w/out murmur  Incisions:  Clean and dry  Abdomen:  Soft, non-distended, non-tender  Extremities:  Warm, well-perfused, swollen    Intake/Output from previous day: 08/26 0701 - 08/27 0700 In: 1852.9 [P.O.:720; I.V.:632.9; IV Piggyback:500] Out: 6387 [FIEPP:2951; Chest Tube:540] Intake/Output this shift:    Lab Results:  CBC: Recent Labs  07/29/14 0414 07/30/14 0415  WBC 9.6 12.4*  HGB 9.6* 10.2*  HCT 29.7* 30.6*  PLT 256 272    BMET:  Recent Labs  07/29/14 0414 07/29/14 1347 07/30/14 0415  NA 140  --  139  K 3.1* 3.5* 3.0*  CL 98  --  96  CO2 23  --  26  GLUCOSE 89  --  116*  BUN 67*  --  64*  CREATININE 3.90*  --  3.42*  CALCIUM 8.0*  --  8.2*     CBG (last 3)   Recent Labs  07/29/14 1149 07/29/14 1613 07/29/14 1925  GLUCAP 195* 129* 155*    ABG    Component Value Date/Time   PHART 7.395 07/25/2014 2059   PCO2ART 31.8* 07/25/2014 2059   PO2ART 65.0* 07/25/2014 2059   HCO3 19.5* 07/25/2014 2059   TCO2 20 07/25/2014 2059   ACIDBASEDEF 5.0* 07/25/2014 2059   O2SAT 93.0 07/25/2014 2059    CXR: PORTABLE CHEST - 1 VIEW  COMPARISON: 07/28/2014  FINDINGS:  Previous CABG, valve replacement surgery, and left apical clip  placement. Bilateral chest tubes in place with no pneumothorax.  Right PICC line stable. Right IJ central line has been removed.  Mediastinal drain has been removed. Patchy airspace opacities in mid  lung zones left greater than right persist. No definite effusion  although the left lateral costophrenic angle is obscured by  overlying monitoring hardware. Heart size remains normal.  Atheromatous aorta.  IMPRESSION:  1. Removal of right IJ central line and mediastinal drain. Otherwise  stable appearance since previous exam.  Electronically Signed  By: Arne Cleveland M.D.  On: 07/30/2014 07:53    Assessment/Plan: S/P Procedure(s) (LRB): ROOT REPLACEMENT WITH BIOPROSTHETIC PORCINE AORTIC ROOT REIMPLANTATION OF LEFT MAIN AND RIGHT CORONARY ARTERIES (N/A) INTRAOPERATIVE TRANSESOPHAGEAL ECHOCARDIOGRAM (N/A) MAZE (N/A) CORONARY ARTERY BYPASS GRAFTING (CABG), on pump, times one, using right greater saphenous vein harvested endoscopically. (N/A) MITRAL VALVE (MV) REPLACEMENT (N/A)  Overall doing fairly well POD6 Bacterial endocarditis s/p AVR+MVR -  OR cultures growing Enterococcus species sensitive to Amp and Vanc - now on Ampicillin + Rocephin Hemodynamically stable off all drips except renal-dose dopamine Remains pacer-dependent with CHB but escape rhythm much improved, temporary V-wires working well Pre-op h/o symptomatic bradycardia, s/p removal of pre-existing perm pacer Acute renal failure on underlying chronic kidney disease, non-oliguric w/ good diuresis last 24 hours, creatinine starting to trend down Expected post op volume excess, severe,  weight trending down Expected post op acute blood loss anemia, mild, stable Expected post op atelectasis, mild, L>R Post-op hypokalemia, diuretic-induced Pre op thrombocytopenia, platelet count now normal and stable, no signs of bleeding Protein-depleted malnutrition, pre and post-op Pre-op high-dose steroids, now tapering Prednisone     Consult EPS - possible perm pacer tomorrow?  Continue lasix drip, keep Foley in place to monitor UOP  Consider decreasing dose of lasix if UOP appears excessive  Supplement potassium  wean renal dose dopamine off  Keep pleural tubes until dry  Mobilize as much as possible  PT consult  Encourage po's and add supplements  Taper Prednisone  Antibiotics per ID team  Watch platelet count, anemia  Continue low dose lovenox for DVT prophylaxis and watch platelet count, continue ASA 81 mg/day for now - ultimately will need Coumadin   [name],[name] 07/30/2014 8:30 AM

## 2014-07-30 NOTE — Progress Notes (Signed)
SUBJECTIVE: The patient is making very slow but steady progress.  Dr Roxy Manns feels that he is ready for PPM implant and has asked EP to reassess.  The patient is up to chair but is otherwise quite limited.  He continues to receive IV abx as per ID.  He has been afebrile.  He has complete heart block.  Epicardial pacing wires continue to function.  Threshold at this time is checked and is 1.5 mA.    Marland Kitchen ampicillin (OMNIPEN) IV  2 g Intravenous 3 times per day  . antiseptic oral rinse  7 mL Mouth Rinse QID  . aspirin EC  81 mg Oral Daily  . bisacodyl  10 mg Oral Daily   Or  . bisacodyl  10 mg Rectal Daily  . cefTRIAXone (ROCEPHIN)  IV  2 g Intravenous Q12H  . chlorhexidine  15 mL Mouth Rinse BID  . docusate sodium  200 mg Oral Daily  . enoxaparin (LOVENOX) injection  30 mg Subcutaneous Q24H  . feeding supplement (ENSURE COMPLETE)  237 mL Oral BID BM  . feeding supplement (PRO-STAT SUGAR FREE 64)  30 mL Oral BID WC  . folic acid  2 mg Oral Daily  . ondansetron (ZOFRAN) IV  8 mg Intravenous 3 times per day  . pantoprazole  40 mg Oral BID  . potassium chloride  10 mEq Intravenous Q1 Hr x 3  . sodium chloride  10-40 mL Intravenous Q12H  . sodium chloride  10-40 mL Intracatheter Q12H  . sodium chloride  3 mL Intravenous Q12H   . sodium chloride 10 mL/hr at 07/30/14 0600  . DOPamine Stopped (07/30/14 1215)  . furosemide (LASIX) infusion 15 mg/hr (07/30/14 0600)    OBJECTIVE: Physical Exam: Filed Vitals:   07/30/14 1300 07/30/14 1400 07/30/14 1500 07/30/14 1608  BP: 119/55 119/51 126/54   Pulse: 87 82 82   Temp:    97.5 F (36.4 C)  TempSrc:    Oral  Resp: 18 24 16    Height:      Weight:      SpO2: 98% 99% 99%     Intake/Output Summary (Last 24 hours) at 07/30/14 1634 Last data filed at 07/30/14 1554  Gross per 24 hour  Intake 2389.51 ml  Output   4585 ml  Net -2195.49 ml    Telemetry reveals sinus rhythm with V pacing  GEN- The patient is ill appearing, alert and oriented  x 3 today.  Up to chair Head- normocephalic, atraumatic Eyes-  Sclera clear, conjunctiva pink Ears- hearing intact Oropharynx- clear Neck- supple, R IJ is removed Lungs- relatively clear, bilateral chest tubes are still in place Heart- Regular rate and rhythm (paced) GI- soft, NT, ND, + BS Extremities- no clubbing, cyanosis, + dependant edema, R arm PICC in place Skin- diffuse ecchymosis Psych- euthymic mood, full affect Neuro- strength and sensation are intact  LABS: Basic Metabolic Panel:  Recent Labs  07/29/14 0414  07/30/14 0415 07/30/14 1415  NA 140  --  139 138  K 3.1*  < > 3.0* 3.2*  CL 98  --  96 96  CO2 23  --  26 25  GLUCOSE 89  --  116* 142*  BUN 67*  --  64* 62*  CREATININE 3.90*  --  3.42* 3.24*  CALCIUM 8.0*  --  8.2* 7.6*  PHOS 7.3*  --   --   --   < > = values in this interval not displayed. 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 Active Problems:   CAD (coronary artery disease)   HTN (hypertension)   Atrial fibrillation   Anemia   Thrombocytopenia   Obstructive sleep apnea   Chronotropic incompetence with sinus node dysfunction   Mitral regurgitation   SOB (shortness of breath)   CHF (congestive heart failure)   Diastolic congestive heart failure   Sepsis   Enterococcal bacteremia   TIA (transient ischemic attack)   Aortic insufficiency   Chronic kidney disease   S/P aortic valve replacement with  stentless valve   S/P mitral valve replacement with bioprosthetic valve   S/P CABG x 1   S/P Maze operation for atrial fibrillation   Acute renal failure superimposed on stage 3 chronic kidney disease   Acute on chronic diastolic heart failure  1. Complete heart block The patient remains quite ill and continues to recover postoperatively following valve surgery.  He is not yet very mobile and his recovery is expected to be slow.  He has severe renal impairment and bilateral chest tubes are still in place.  He is far from an ideal candidate for transvenous permanent pacemaker at this time.  His risks of reinfection are quite high.  Ideally, he would have an epicardial permanent pacing system implanted.  I have spoken with Dr Roxy Manns who does not feel that the patient is a candidate for an epicardial pacing system and does not feel that he would tolerate this procedure.  We will therefore consider proceeding with permanent pacemaker implantation.   I have spoken with Dr Linus Salmons with ID who feels that we can proceed with PPM implant at this point.  He anticipates empiric IV antibiotics for 6 weeks from the time of valve surgery.  I will request that PICC be moved from the R arm to the L arm as I ancipitate that PPM will be implanted on the R side. I have spoken with Dr Lovena Le who knows the patient very well.  He is planning permanent pacemaker placement tomorrow.  I have spoken with the patient and his family about the risks of this procedure.  They understand and are willing to proceed at this time.  Ultimately, this patients prognosis is very poor.   Thompson Grayer, MD 07/30/2014 4:34 PM

## 2014-07-31 ENCOUNTER — Inpatient Hospital Stay (HOSPITAL_COMMUNITY): Payer: Medicare Other

## 2014-07-31 ENCOUNTER — Encounter (HOSPITAL_COMMUNITY)
Admission: EM | Disposition: E | Payer: Self-pay | Source: Home / Self Care | Attending: Thoracic Surgery (Cardiothoracic Vascular Surgery)

## 2014-07-31 DIAGNOSIS — I442 Atrioventricular block, complete: Secondary | ICD-10-CM

## 2014-07-31 HISTORY — PX: PERMANENT PACEMAKER INSERTION: SHX5480

## 2014-07-31 LAB — CBC
HCT: 30 % — ABNORMAL LOW (ref 39.0–52.0)
Hemoglobin: 10 g/dL — ABNORMAL LOW (ref 13.0–17.0)
MCH: 33.4 pg (ref 26.0–34.0)
MCHC: 33.3 g/dL (ref 30.0–36.0)
MCV: 100.3 fL — ABNORMAL HIGH (ref 78.0–100.0)
PLATELETS: 242 10*3/uL (ref 150–400)
RBC: 2.99 MIL/uL — ABNORMAL LOW (ref 4.22–5.81)
RDW: 22.3 % — AB (ref 11.5–15.5)
WBC: 11.3 10*3/uL — ABNORMAL HIGH (ref 4.0–10.5)

## 2014-07-31 LAB — BASIC METABOLIC PANEL
Anion gap: 15 (ref 5–15)
BUN: 62 mg/dL — ABNORMAL HIGH (ref 6–23)
CO2: 27 mEq/L (ref 19–32)
CREATININE: 3.16 mg/dL — AB (ref 0.50–1.35)
Calcium: 8.3 mg/dL — ABNORMAL LOW (ref 8.4–10.5)
Chloride: 98 mEq/L (ref 96–112)
GFR calc non Af Amer: 17 mL/min — ABNORMAL LOW (ref >90)
GFR, EST AFRICAN AMERICAN: 20 mL/min — AB (ref >90)
Glucose, Bld: 112 mg/dL — ABNORMAL HIGH (ref 70–99)
Potassium: 3.7 mEq/L (ref 3.7–5.3)
Sodium: 140 mEq/L (ref 137–147)

## 2014-07-31 LAB — CLOSTRIDIUM DIFFICILE BY PCR: Toxigenic C. Difficile by PCR: NEGATIVE

## 2014-07-31 SURGERY — PERMANENT PACEMAKER INSERTION
Anesthesia: LOCAL

## 2014-07-31 MED ORDER — HEPARIN (PORCINE) IN NACL 2-0.9 UNIT/ML-% IJ SOLN
INTRAMUSCULAR | Status: AC
  Filled 2014-07-31: qty 500

## 2014-07-31 MED ORDER — SODIUM CHLORIDE 0.9 % IJ SOLN
10.0000 mL | Freq: Two times a day (BID) | INTRAMUSCULAR | Status: DC
  Administered 2014-07-31 – 2014-08-12 (×8): 10 mL
  Administered 2014-08-13: 20 mL

## 2014-07-31 MED ORDER — LIDOCAINE HCL (PF) 1 % IJ SOLN
INTRAMUSCULAR | Status: AC
  Filled 2014-07-31: qty 60

## 2014-07-31 MED ORDER — SODIUM CHLORIDE 0.9 % IR SOLN
Status: DC
  Filled 2014-07-31: qty 2

## 2014-07-31 MED ORDER — WARFARIN SODIUM 2.5 MG PO TABS
2.5000 mg | ORAL_TABLET | Freq: Every day | ORAL | Status: DC
  Administered 2014-07-31 – 2014-08-04 (×5): 2.5 mg via ORAL
  Filled 2014-07-31 (×6): qty 1

## 2014-07-31 MED ORDER — SODIUM CHLORIDE 0.9 % IV SOLN: INTRAVENOUS | Status: DC

## 2014-07-31 MED ORDER — CEFAZOLIN SODIUM-DEXTROSE 2-3 GM-% IV SOLR
INTRAVENOUS | Status: AC
  Filled 2014-07-31: qty 50

## 2014-07-31 MED ORDER — FUROSEMIDE 10 MG/ML IJ SOLN
40.0000 mg | Freq: Two times a day (BID) | INTRAMUSCULAR | Status: DC
  Administered 2014-07-31: 40 mg via INTRAVENOUS
  Filled 2014-07-31 (×4): qty 4

## 2014-07-31 MED ORDER — GENTAMICIN SULFATE 40 MG/ML IJ SOLN: 80.0000 mg | INTRAMUSCULAR | Status: DC

## 2014-07-31 MED ORDER — MIDAZOLAM HCL 5 MG/5ML IJ SOLN
INTRAMUSCULAR | Status: AC
  Filled 2014-07-31: qty 5

## 2014-07-31 MED ORDER — CEFAZOLIN SODIUM-DEXTROSE 2-3 GM-% IV SOLR: 2.0000 g | INTRAVENOUS | Status: DC

## 2014-07-31 MED ORDER — ONDANSETRON HCL 4 MG/2ML IJ SOLN: 4.0000 mg | Freq: Four times a day (QID) | INTRAMUSCULAR | Status: DC | PRN

## 2014-07-31 MED ORDER — FUROSEMIDE 10 MG/ML IJ SOLN: 80.0000 mg | Freq: Two times a day (BID) | INTRAMUSCULAR | Status: DC

## 2014-07-31 MED ORDER — TAMSULOSIN HCL 0.4 MG PO CAPS
0.4000 mg | ORAL_CAPSULE | Freq: Every day | ORAL | Status: DC
  Administered 2014-07-31 – 2014-08-05 (×6): 0.4 mg via ORAL
  Filled 2014-07-31 (×7): qty 1

## 2014-07-31 MED ORDER — CEFAZOLIN SODIUM 1-5 GM-% IV SOLN
1.0000 g | Freq: Three times a day (TID) | INTRAVENOUS | Status: DC
  Administered 2014-07-31 – 2014-08-01 (×2): 1 g via INTRAVENOUS
  Filled 2014-07-31 (×3): qty 50

## 2014-07-31 MED ORDER — FENTANYL CITRATE 0.05 MG/ML IJ SOLN
INTRAMUSCULAR | Status: AC
  Filled 2014-07-31: qty 2

## 2014-07-31 MED ORDER — WARFARIN - PHYSICIAN DOSING INPATIENT
Freq: Every day | Status: DC
  Administered 2014-08-01 – 2014-08-07 (×3)

## 2014-07-31 MED ORDER — SODIUM CHLORIDE 0.9 % IJ SOLN
10.0000 mL | INTRAMUSCULAR | Status: DC | PRN
  Administered 2014-08-02 (×2): 30 mL
  Administered 2014-08-03: 10 mL
  Administered 2014-08-03: 30 mL
  Administered 2014-08-03 – 2014-08-04 (×3): 10 mL
  Administered 2014-08-04: 20 mL
  Administered 2014-08-04 (×2): 10 mL
  Administered 2014-08-05: 20 mL
  Administered 2014-08-05 – 2014-08-10 (×4): 10 mL

## 2014-07-31 MED ORDER — POTASSIUM CHLORIDE 10 MEQ/50ML IV SOLN
10.0000 meq | INTRAVENOUS | Status: AC
  Administered 2014-07-31 (×4): 10 meq via INTRAVENOUS
  Filled 2014-07-31 (×4): qty 50

## 2014-07-31 MED ORDER — ACETAMINOPHEN 325 MG PO TABS
325.0000 mg | ORAL_TABLET | ORAL | Status: DC | PRN
  Administered 2014-08-02 – 2014-08-16 (×6): 650 mg via ORAL
  Administered 2014-08-21: 325 mg via ORAL
  Administered 2014-08-27 – 2014-08-31 (×2): 650 mg via ORAL
  Filled 2014-07-31 (×4): qty 2
  Filled 2014-07-31: qty 1
  Filled 2014-07-31 (×4): qty 2

## 2014-07-31 MED ORDER — MOVING RIGHT ALONG BOOK
Freq: Once | Status: AC
  Administered 2014-07-31: 1
  Filled 2014-07-31: qty 1

## 2014-07-31 MED ORDER — CHLORHEXIDINE GLUCONATE 4 % EX LIQD: 60.0000 mL | Freq: Once | CUTANEOUS | Status: DC

## 2014-07-31 NOTE — CV Procedure (Signed)
SURGEON:  Cristopher Peru, MD     PREPROCEDURE DIAGNOSIS:  Symptomatic Bradycardia due to complete heart block    POSTPROCEDURE DIAGNOSIS:  Same as preprocedure diagnosis     PROCEDURES:   1. right upper extremity venography.   2. Pacemaker implantation.     INTRODUCTION: Justin [name] is a 80 y.o. male  with a history of bradycardia who presents today for pacemaker implantation.  The patient reports intermittent episodes of dizziness over the past few months.  No reversible causes have been identified.  The patient therefore presents today for pacemaker implantation.     DESCRIPTION OF PROCEDURE:  Informed written consent was obtained, and   the patient was brought to the electrophysiology lab in a fasting state.  The patient was sedated with versed and fentanyl.  The patients right chest was prepped and draped in the usual sterile fashion by the EP lab staff. The skin overlying the right deltopectoral region was infiltrated with lidocaine for local analgesia.  A 4-cm incision was made over the right deltopectoral region.  A right subcutaneous pacemaker pocket was fashioned using a combination of sharp and blunt dissection. Electrocautery was required to assure hemostasis.    Left Upper Extremity Venography: After multiple attempts to access the right axillary vein were unsuccessful, a venogram of the right upper extremity was performed, which revealed a large right cephalic vein, which emptied into a large right subclavian vein.  The right axillary vein was moderate in size.    RA/RV Lead Placement: The right axillary vein was therefore directly visualized and cannulated.  Through the right axillary vein, a Medtronic [id number] (serial number [id number]) right atrial lead and a Medtronic 5076 (serial number [id number]) right ventricular lead were advanced with fluoroscopic visualization into the right atrial appendage and right ventricular apical septal positions respectively.  Initial atrial lead P-  waves measured 2.3 mV with impedance of 464 ohms and a threshold of 1.7 V at 0.5 msec.  Right ventricular lead R-waves measured 10.3 mV with an impedance of 621 ohms and a threshold of 1 V at 0.5 msec.  Both leads were secured to the pectoralis fascia using #2-0 silk over the suture sleeves.   Device Placement:  The leads were then connected to a Medtronic Adapta L (serial number [id number]) pacemaker.  The pocket was irrigated with copious gentamicin solution.  The pacemaker was then placed into the pocket.  The pocket was then closed in 2 layers with 2.0 Vicryl suture for the subcutaneous and subcuticular layers.  Steri-Strips and a sterile dressing were then applied.  There were no early apparent complications.     CONCLUSIONS:   1. Successful implantation of a Medtronic dual-chamber pacemaker for symptomatic bradycardia due to complete heart block.  2. No early apparent complications.           Cristopher Peru, MD 07/17/2014 2:06 PM

## 2014-07-31 NOTE — H&P (Signed)
Reason for Consult: Complete heart block after aortic valve replacement  Referring Physician: Dr. Bennye Alm is an 80 y.o. male.   HPI: The patient is a 80 year old man with a history of symptomatic bradycardia, who underwent permanent pacemaker insertion many years ago. He subsequently developed enterococcal endocarditis, affecting the aortic valve. He underwent pacemaker system extraction, followed by aortic valve replacement with aortic root replacement and reimplantation of the coronary arteries. Postoperatively, the patient has complete heart block, and is now referred for insertion of a dual-chamber pacemaker. He has no reversible causes for heart block. PMH: Past Medical History  Diagnosis Date  . CAD (coronary artery disease) prior stenting 1999   . Dyslipidemia   . HTN (hypertension)   . WPW (Wolff-Parkinson-White syndrome) loss of preexcitation   . Atrial fibrillation   . AV block, Mobitz II   . Presyncope   . Myocardial infarction   . Pacemaker 04/07/2013  . Arthritis   . History of chicken pox   . Anemia   . Hypernatremia 06/03/2013  . Thrombocytopenia, unspecified 06/03/2013  . Leukopenia 06/03/2013  . Obstructive sleep apnea 06/03/2013    USES CPAP  . Urinary incontinence 06/03/2013  . Hyperglycemia 12/21/2013  . Pancytopenia 06/03/2013  . Iron deficiency anemia, unspecified 07/02/2014  . Malabsorption of iron 07/02/2014  . Hypotestosteronemia 07/02/2014  . ITP (idiopathic thrombocytopenic purpura) 07/02/2014    possible - although patient had bacterial endocarditis at the time of diagnosis  . Aortic insufficiency   . Chronic kidney disease   . Bacterial endocarditis      Enterococcus sepsis with aortic valve vegetation  . UTI (urinary tract infection) 07/27/2014    ENTEROBACTER CLOACAE   . S/P aortic valve and mitral valve replacement 08/03/2014    21 mm Medtronic Freestyle porcine aortic root graft 29 mm [facility] Mitral bovine bioprosthetic mitral valve   . S/P  aortic valve replacement with stentless valve 07/29/2014    21 mm Medtronic Freestyle porcine aortic root graft with reimplantation of left main and right coronary arteries  . S/P mitral valve replacement with bioprosthetic valve 07/10/2014    29 mm [facility] Mitral bovine bioprosthetic tissue valve  . S/P CABG x 1 08/02/2014    SVG to OM1 with EVH via right thigh  . S/P Maze operation for atrial fibrillation 07/27/2014    Complete bilateral atrial lesion set using cryothermy and bipolar radiofrequency ablation with clipping of LA appendage  . Acute renal failure superimposed on stage 3 chronic kidney disease 07/27/2014  . Acute on chronic diastolic heart failure 8/75/6433    PSHX: Past Surgical History  Procedure Laterality Date  . Coronary angioplasty with stent placement    . Vastectomy    . Insert / replace / remove pacemaker  04/07/2013  . Tee without cardioversion N/A 07/08/2014    Procedure: TRANSESOPHAGEAL ECHOCARDIOGRAM (TEE);  Surgeon: Lelon Perla, MD;  Location: [place];  Service: Cardiovascular;  Laterality: N/A;  . Aortic valve replacement N/A 07/11/2014    Procedure: ROOT REPLACEMENT WITH BIOPROSTHETIC PORCINE AORTIC ROOT REIMPLANTATION OF LEFT MAIN AND RIGHT CORONARY ARTERIES;  Surgeon: Rexene Alberts, MD;  Location: [place];  Service: Open Heart Surgery;  Laterality: N/A;  . Intraoperative transesophageal echocardiogram N/A 07/29/2014    Procedure: INTRAOPERATIVE TRANSESOPHAGEAL ECHOCARDIOGRAM;  Surgeon: Rexene Alberts, MD;  Location: [place];  Service: Open Heart Surgery;  Laterality: N/A;  . Maze N/A 07/13/2014    Procedure: MAZE;  Surgeon: Rexene Alberts, MD;  Location: [facility];  Service: Open Heart Surgery;  Laterality: N/A;  . Coronary artery bypass graft N/A 07/28/2014    Procedure: CORONARY ARTERY BYPASS GRAFTING (CABG), on pump, times one, using right greater saphenous vein harvested endoscopically.;  Surgeon: Rexene Alberts, MD;  Location: [place];  Service: Open  Heart Surgery;  Laterality: N/A;  . Mitral valve replacement N/A 07/07/2014    Procedure: MITRAL VALVE (MV) REPLACEMENT;  Surgeon: Rexene Alberts, MD;  Location: [place];  Service: Open Heart Surgery;  Laterality: N/A;    FAMHX: Family History  Problem Relation Age of Onset  . Stroke Mother   . Hypertension Mother   . Stroke Sister   . Arthritis Sister     rheumatoid  . Heart attack Sister   . Anemia Sister   . Other Brother     tube put in aorta  . Anemia Brother   . Other Son     cortisone deficiency  . Arthritis Son   . Stroke Brother   . Alcohol abuse Brother   . Barrett's esophagus Son   . Colon cancer Maternal Grandmother   . Colon cancer Maternal Grandfather     Social History:  reports that he quit smoking about 22 years ago. His smoking use included Cigarettes. He started smoking about 59 years ago. He has a 36 pack-year smoking history. He has never used smokeless tobacco. He reports that he drinks alcohol. He reports that he does not use illicit drugs.  Allergies: No Known Allergies  Medications: Reviewed  Dg Chest Port 1 View  07/25/2014   CLINICAL DATA:  New left PICC  EXAM: PORTABLE CHEST - 1 VIEW  COMPARISON:  07/30/2014  FINDINGS: Left PICC has its tip in the lower superior vena cava, well positioned. The tip lies adjacent to the indwelling right PICC, which is unchanged from the prior study.  Changes from cardiac surgery are stable.  Left and right chest tubes are stable.  No pneumothorax.  Mild irregular interstitial thickening with patchy hazy airspace opacity is noted in the left mid lung, without change. Right mid lung zone patchy airspace opacity has improved, likely improved atelectasis.  IMPRESSION: 1. New left PICC is well positioned. 2. Improved small area patchy airspace opacity in the right mid lung, likely improved atelectasis. 3. No other change.  No pneumothorax.   Electronically Signed   By: Lajean Manes M.D.   On: 07/15/2014 10:22   Dg Chest Port 1  View  07/30/2014   CLINICAL DATA:  follow-up CHF  EXAM: PORTABLE CHEST - 1 VIEW  COMPARISON:  07/28/2014  FINDINGS: Previous CABG, valve replacement surgery, and left apical clip placement. Bilateral chest tubes in place with no pneumothorax. Right PICC line stable. Right IJ central line has been removed. Mediastinal drain has been removed. Patchy airspace opacities in mid lung zones left greater than right persist. No definite effusion although the left lateral costophrenic angle is obscured by overlying monitoring hardware. Heart size remains normal. Atheromatous aorta.  IMPRESSION: 1. Removal of right IJ central line and mediastinal drain. Otherwise stable appearance since previous exam.   Electronically Signed   By: Arne Cleveland M.D.   On: 07/30/2014 07:53    ROS  As stated in the HPI and negative for all other systems.  Physical Exam  Vitals:Blood pressure 122/69, pulse 91, temperature 98.5 F (36.9 C), temperature source Oral, resp. rate 17, height 5\' 11"  (1.803 m), weight 198 lb 3.1 oz (89.9 kg), SpO2 98.00%.  Well appearing NAD HEENT: Unremarkable Neck:  No JVD, no thyromegally Lymphatics:  No adenopathy Back:  No CVA tenderness Lungs:  Clear HEART:  Regular rate rhythm, no murmurs, no rubs, no clicks Abd:  Flat, positive bowel sounds, no organomegally, no rebound, no guarding Ext:  2 plus pulses, no edema, no cyanosis, no clubbing Skin:  No rashes no nodules Neuro:  CN II through XII intact, motor grossly intact  Assessment/Plan: 1. Complete heart block following aortic valve replacement 2. 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 The risk, benefits, holes, and expectations of the procedure have been discussed with the patient and he wishes to proceed.  Carleene Overlie TaylorMD 07/27/2014, 12:56 PM

## 2014-07-31 NOTE — Progress Notes (Signed)
[facility] BRIEF SICU PROGRESS NOTE  Day of Surgery  S/P Procedure(s) (LRB): PERMANENT PACEMAKER INSERTION (N/A)   Doing well after return from perm pacer  Plan: Continue previous plan.  Start coumadin.  Kileen Lange H 07/05/2014 7:01 PM

## 2014-07-31 NOTE — Progress Notes (Signed)
Dr. Roxy Manns confirmed left arm PICC placement as being in the SVC so ok to remove the right arm PICC for pacemaker placement.

## 2014-07-31 NOTE — Progress Notes (Signed)
[address]       [place] [address]             [phone]        CARDIOTHORACIC SURGERY PROGRESS NOTE   R7 Days Post-Op Procedure(s) (LRB): ROOT REPLACEMENT WITH BIOPROSTHETIC PORCINE AORTIC ROOT REIMPLANTATION OF LEFT MAIN AND RIGHT CORONARY ARTERIES (N/A) INTRAOPERATIVE TRANSESOPHAGEAL ECHOCARDIOGRAM (N/A) MAZE (N/A) CORONARY ARTERY BYPASS GRAFTING (CABG), on pump, times one, using right greater saphenous vein harvested endoscopically. (N/A) MITRAL VALVE (MV) REPLACEMENT (N/A)  Subjective: Feels okay except that he developed diarrhea overnight.  Denies abdominal pain.  No SOB  Objective: Vital signs: BP Readings from Last 1 Encounters:  08/01/2014 122/69   Pulse Readings from Last 1 Encounters:  07/13/2014 91   Resp Readings from Last 1 Encounters:  07/22/2014 17   Temp Readings from Last 1 Encounters:  07/30/2014 98.5 F (36.9 C) Oral    Hemodynamics:    Physical Exam:  Rhythm:   AV paced  Breath sounds: clear  Heart sounds:  RRR  Incisions:  Clean and dry  Abdomen:  Soft, non-distended, non-tender  Extremities:  Warm, well-perfused    Intake/Output from previous day: 08/27 0701 - 08/28 0700 In: 2627.3 [P.O.:860; I.V.:533.3; IV Piggyback:1234] Out: 3566 [Urine:2785; Stool:1; Chest Tube:780] Intake/Output this shift:    Lab Results:  CBC: Recent Labs  07/30/14 0415 07/30/14 2310 07/16/2014 0530  WBC 12.4*  --  11.3*  HGB 10.2* 11.6* 10.0*  HCT 30.6* 34.0* 30.0*  PLT 272  --  242    BMET:  Recent Labs  07/30/14 1415 07/30/14 2310 07/17/2014 0530  NA 138 138 140  K 3.2* 3.3* 3.7  CL 96  --  98  CO2 25  --  27  GLUCOSE 142* 104* 112*  BUN 62*  --  62*  CREATININE 3.24*  --  3.16*  CALCIUM 7.6*  --  8.3*     CBG (last 3)   Recent Labs  07/29/14 1149 07/29/14 1613 07/29/14 1925  GLUCAP 195* 129* 155*    ABG    Component Value Date/Time   PHART 7.395 07/25/2014 2059   PCO2ART 31.8* 07/25/2014 2059   PO2ART  65.0* 07/25/2014 2059   HCO3 19.5* 07/25/2014 2059   TCO2 20 07/25/2014 2059   ACIDBASEDEF 5.0* 07/25/2014 2059   O2SAT 93.0 07/25/2014 2059    CXR: PORTABLE CHEST - 1 VIEW  COMPARISON: 07/28/2014  FINDINGS:  Previous CABG, valve replacement surgery, and left apical clip  placement. Bilateral chest tubes in place with no pneumothorax.  Right PICC line stable. Right IJ central line has been removed.  Mediastinal drain has been removed. Patchy airspace opacities in mid  lung zones left greater than right persist. No definite effusion  although the left lateral costophrenic angle is obscured by  overlying monitoring hardware. Heart size remains normal.  Atheromatous aorta.  IMPRESSION:  1. Removal of right IJ central line and mediastinal drain. Otherwise  stable appearance since previous exam.  Electronically Signed  By: Arne Cleveland M.D.  On: 07/30/2014 07:53   Assessment/Plan: S/P Procedure(s) (LRB): ROOT REPLACEMENT WITH BIOPROSTHETIC PORCINE AORTIC ROOT REIMPLANTATION OF LEFT MAIN AND RIGHT CORONARY ARTERIES (N/A) INTRAOPERATIVE TRANSESOPHAGEAL ECHOCARDIOGRAM (N/A) MAZE (N/A) CORONARY ARTERY BYPASS GRAFTING (CABG), on pump, times one, using right greater saphenous vein harvested endoscopically. (N/A) MITRAL VALVE (MV) REPLACEMENT (N/A)  Overall doing fairly well POD7 Bacterial endocarditis s/p AVR+MVR - OR cultures growing Enterococcus species sensitive to Amp and Vanc -  now on Ampicillin + Rocephin Hemodynamically stable off all drips  Remains pacer-dependent with CHB but escape rhythm much improved, temporary V-wires working well Pre-op h/o symptomatic bradycardia, s/p removal of pre-existing perm pacer Acute renal failure on underlying chronic kidney disease, non-oliguric w/ good diuresis last 48 hours, creatinine slowly trending down Expected post op volume excess, improving, weight trending down Expected post op acute blood loss anemia, mild, stable Expected post op  atelectasis, mild, L>R Post-op hypokalemia, diuretic-induced Pre op thrombocytopenia, platelet count now normal and stable, no signs of bleeding Protein-depleted malnutrition, pre and post-op Pre-op high-dose steroids, now tapering Prednisone     For perm pacer replacement  Will stop lasix drip and resume intermittent lasix dosing after pacer in place,  Keep Foley in place to monitor UOP  Supplement potassium  Stop all stool softeners and check C.diff assay, although clinically not suggestive of C.diff  Keep pleural tubes until dry  Mobilize as much as possible  PT consult  Encourage po's and add supplements  Taper Prednisone  Antibiotics per ID team  Watch platelet count, anemia  Continue low dose lovenox for DVT prophylaxis and watch platelet count, continue ASA 81 mg/day for now - start Coumadin after pacer in place   [facility] H 07/19/2014 10:16 AM

## 2014-07-31 NOTE — Progress Notes (Signed)
Patient ID: Justin [name], male   DOB: 1933-08-25, 80 y.o.   MRN: [id number]  [facility] Progress Note    Assessment/ Plan:    1. Acute on CKD stage 2 Renal Failure: Baseline Scr ~1.2. Patient net negative 1.3L yesterday with IV lasix drip. Continues to have good response to furosemide drip and would recommend conversion of this to oral therapy (120 oral twice a day) versus bolus dosing (80 mg 3 times a day intravenously) as he now has extraneous losses from diarrhea and is likely to become volume depleted. Renal function essentially unchanged last 24 hours 2. Hypokalemia- due to lasix and now diarrhea. Potassium replaced overnight, continue to monitor 3. AG metabolic acidosis: secondary to #1. Imporved 4. Anemia: Heme following, Hgb stable, has received Aranesp. IV iron repeated today. 5. Thrombocytopenia: likely due to endocarditis now resolved. Prednisone discontinued. 6. Enterococcus endocarditis: s/p MVR, Aortic root replacement, CABG, ID following: dual B lactam therapy with ampicillin and ceftriaxone.   Subjective:   Developed diarrhea overnight, C. difficile testing pending. (Currently on contact precautions). Patient reports to be feeling fair other than complaint of diarrhea.    Objective:   BP 122/69  Pulse 91  Temp(Src) 98.5 F (36.9 C) (Oral)  Resp 17  Ht 5\' 11"  (1.803 m)  Wt 89.9 kg (198 lb 3.1 oz)  BMI 27.65 kg/m2  SpO2 98%  Intake/Output Summary (Last 24 hours) at 07/30/2014 4132 Last data filed at 07/08/2014 4401  Gross per 24 hour  Intake 2157.71 ml  Output   3466 ml  Net -1308.29 ml   Weight change: -6.9 kg (-15 lb 3.4 oz)  Physical Exam: Gen: Comfortably sitting up in his recliner CVS: Pulse regular in rate and rhythm, S1 and S2 normal Resp: Decreased breath sounds over bases otherwise clear Abd: Soft, flat, nontender Ext: One plus lower extremity edema  Imaging: Dg Chest Port 1 View  07/30/2014   CLINICAL DATA:  follow-up CHF  EXAM:  PORTABLE CHEST - 1 VIEW  COMPARISON:  07/28/2014  FINDINGS: Previous CABG, valve replacement surgery, and left apical clip placement. Bilateral chest tubes in place with no pneumothorax. Right PICC line stable. Right IJ central line has been removed. Mediastinal drain has been removed. Patchy airspace opacities in mid lung zones left greater than right persist. No definite effusion although the left lateral costophrenic angle is obscured by overlying monitoring hardware. Heart size remains normal. Atheromatous aorta.  IMPRESSION: 1. Removal of right IJ central line and mediastinal drain. Otherwise stable appearance since previous exam.   Electronically Signed   By: Arne Cleveland M.D.   On: 07/30/2014 07:53    Labs: BMET  Recent Labs Lab 07/27/14 2245 07/28/14 0432 07/28/14 1400 07/29/14 0414 07/29/14 1347 07/30/14 0415 07/30/14 1415 07/30/14 2310 07/07/2014 0530  NA 135* 135* 135* 140  --  139 138 138 140  K 5.1 4.6 4.0 3.1* 3.5* 3.0* 3.2* 3.3* 3.7  CL 95* 96 95* 98  --  96 96  --  98  CO2 21 21 22 23   --  26 25  --  27  GLUCOSE 109* 95 122* 89  --  116* 142* 104* 112*  BUN 59* 62* 66* 67*  --  64* 62*  --  62*  CREATININE 4.15* 4.11* 4.04* 3.90*  --  3.42* 3.24*  --  3.16*  CALCIUM 7.6* 7.7* 7.5* 8.0*  --  8.2* 7.6*  --  8.3*  PHOS  --   --   --  7.3*  --   --   --   --   --    CBC  Recent Labs Lab 07/28/14 0432 07/29/14 0414 07/30/14 0415 07/30/14 2310 07/18/2014 0530  WBC 9.6 9.6 12.4*  --  11.3*  HGB 9.2* 9.6* 10.2* 11.6* 10.0*  HCT 28.1* 29.7* 30.6* 34.0* 30.0*  MCV 94.9 96.1 98.7  --  100.3*  PLT 231 256 272  --  242    Medications:    . ampicillin (OMNIPEN) IV  2 g Intravenous 3 times per day  . antiseptic oral rinse  7 mL Mouth Rinse QID  . aspirin EC  81 mg Oral Daily  . bisacodyl  10 mg Oral Daily   Or  . bisacodyl  10 mg Rectal Daily  . cefTRIAXone (ROCEPHIN)  IV  2 g Intravenous Q12H  . chlorhexidine  15 mL Mouth Rinse BID  . docusate sodium  200 mg Oral  Daily  . enoxaparin (LOVENOX) injection  30 mg Subcutaneous Q24H  . feeding supplement (ENSURE COMPLETE)  237 mL Oral BID BM  . feeding supplement (PRO-STAT SUGAR FREE 64)  30 mL Oral BID WC  . folic acid  2 mg Oral Daily  . ondansetron (ZOFRAN) IV  8 mg Intravenous 3 times per day  . pantoprazole  40 mg Oral BID  . potassium chloride  10 mEq Intravenous Q1 Hr x 4  . sodium chloride  10-40 mL Intravenous Q12H  . sodium chloride  10-40 mL Intracatheter Q12H  . sodium chloride  3 mL Intravenous Q12H   Elmarie Shiley, MD 07/26/2014, 8:38 AM

## 2014-07-31 NOTE — Progress Notes (Signed)
PT Cancellation Note  Patient Details Name: JAYDAN MEIDINGER MRN: [id number] DOB: [birth year]   Cancelled Treatment:    Reason Eval/Treat Not Completed: Medical issues which prohibited therapy (Pt going to get pacemaker.)   Gladys Deckard 07/08/2014, 11:31 AM

## 2014-07-31 NOTE — Progress Notes (Signed)
Justin [name]   DOB:August 10, 1933   SW#:[id number]   DUK#:[id number]  Subjective: Justin [name] is improving slowly. His renal function seems to be getting better. He  had a good urine output. He is on a Lasix drip.  He does have chest tubes in place He has a Picc line placement today. Appetite decreased in the setting of all the recent medical events, but he is trying to increase on his own.  Scheduled Meds: . ampicillin (OMNIPEN) IV  2 g Intravenous 3 times per day  . antiseptic oral rinse  7 mL Mouth Rinse QID  . aspirin EC  81 mg Oral Daily  . bisacodyl  10 mg Oral Daily   Or  . bisacodyl  10 mg Rectal Daily  . cefTRIAXone (ROCEPHIN)  IV  2 g Intravenous Q12H  . chlorhexidine  15 mL Mouth Rinse BID  . docusate sodium  200 mg Oral Daily  . enoxaparin (LOVENOX) injection  30 mg Subcutaneous Q24H  . feeding supplement (ENSURE COMPLETE)  237 mL Oral BID BM  . feeding supplement (PRO-STAT SUGAR FREE 64)  30 mL Oral BID WC  . folic acid  2 mg Oral Daily  . ondansetron (ZOFRAN) IV  8 mg Intravenous 3 times per day  . pantoprazole  40 mg Oral BID  . potassium chloride  10 mEq Intravenous Q1 Hr x 4  . sodium chloride  10-40 mL Intravenous Q12H  . sodium chloride  10-40 mL Intracatheter Q12H  . sodium chloride  3 mL Intravenous Q12H   Continuous Infusions: . sodium chloride 10 mL/hr at 08/03/2014 0000  . DOPamine Stopped (07/30/14 1215)  . furosemide (LASIX) infusion 15 mg/hr (07/07/2014 0600)   PRN Meds:.potassium chloride, promethazine, sodium chloride, sodium chloride, sodium chloride, traMADol   Objective:  Filed Vitals:   08/01/2014 0800  BP:   Pulse:   Temp: 98.5 F (36.9 C)  Resp:       Intake/Output Summary (Last 24 hours) at 07/18/2014 0943 Last data filed at 07/26/2014 0616  Gross per 24 hour  Intake 2127.91 ml  Output   3366 ml  Net -1238.09 ml      GENERAL:alert, no distress and comfortable SKIN: skin color, texture, turgor are normal, no rashes or significant  lesions EYES: normal, conjunctiva are pink and non-injected, sclera clear OROPHARYNX:no exudate, no erythema and lips, buccal mucosa, and tongue normal  NECK: supple, thyroid normal size, non-tender, without nodularity LYMPH:  no palpable lymphadenopathy in the cervical, axillary or inguinal LUNGS: decreased breath sounds at the bases with normal breathing effort. bilateral chest tubes in place HEART: regular rate & rhythm and no murmurs and 1+ lower extremity edema ABDOMEN:abdomen soft, non-tender and normal bowel sounds Musculoskeletal:no cyanosis of digits and no clubbing  PSYCH: alert & oriented x 3 with fluent speech NEURO: no focal motor/sensory deficits    CBG (last 3)   Recent Labs  07/29/14 1149 07/29/14 1613 07/29/14 1925  GLUCAP 195* 129* 155*     Labs:   Recent Labs Lab 07/27/14 0400 07/28/14 0432 07/29/14 0414 07/30/14 0415 07/30/14 2310 07/05/2014 0530  WBC 10.5 9.6 9.6 12.4*  --  11.3*  HGB 9.8* 9.2* 9.6* 10.2* 11.6* 10.0*  HCT 29.6* 28.1* 29.7* 30.6* 34.0* 30.0*  PLT PLATELET CLUMPS NOTED ON SMEAR, COUNT APPEARS ADEQUATE 231 256 272  --  242  MCV 96.7 94.9 96.1 98.7  --  100.3*  MCH 32.0 31.1 31.1 32.9  --  33.4  MCHC 33.1 32.7 32.3 33.3  --  33.3  RDW 21.6* 21.3* 21.3* 21.6*  --  22.3*     Chemistries:    Recent Labs Lab 07/25/14 0300 07/25/14 0915 07/25/14 1700  07/26/14 0353  07/27/14 0400  07/28/14 0432 07/28/14 1400 07/29/14 0414 07/29/14 1347 07/30/14 0415 07/30/14 1415 07/30/14 2310 08/01/2014 0530  NA 135* 137  --   < > 133*  < > 131*  < > 135* 135* 140  --  139 138 138 140  K 5.0 5.2  --   < > 5.2  < > 5.8*  < > 4.6 4.0 3.1* 3.5* 3.0* 3.2* 3.3* 3.7  CL 97 99  --   < > 96  < > 94*  < > 96 95* 98  --  96 96  --  98  CO2 23 22  --   --  20  < > 18*  < > 21 22 23   --  26 25  --  27  GLUCOSE 136* 184*  --   < > 131*  < > 124*  < > 95 122* 89  --  116* 142* 104* 112*  BUN 25* 27*  --   < > 37*  < > 50*  < > 62* 66* 67*  --  64* 62*   --  62*  CREATININE 1.55* 1.74* 2.21*  < > 2.67*  < > 3.68*  < > 4.11* 4.04* 3.90*  --  3.42* 3.24*  --  3.16*  CALCIUM 7.8* 7.5*  --   --  7.5*  < > 7.7*  < > 7.7* 7.5* 8.0*  --  8.2* 7.6*  --  8.3*  MG 2.9*  --  2.8*  --   --   --   --   --   --   --   --   --   --   --   --   --   AST  --  135*  --   --  138*  --  109*  --  74*  --   --   --   --   --   --   --   ALT  --  26  --   --  31  --  36  --  35  --   --   --   --   --   --   --   ALKPHOS  --  33*  --   --  48  --  69  --  102  --   --   --   --   --   --   --   BILITOT  --  1.1  --   --  0.6  --  0.4  --  0.3  --   --   --   --   --   --   --   < > = values in this interval not displayed.  GFR Estimated Creatinine Clearance: 20.2 ml/min (by C-G formula based on Cr of 3.16).  Liver Function Tests:  Recent Labs Lab 07/25/14 0915 07/26/14 0353 07/27/14 0400 07/28/14 0432 07/29/14 0414  AST 135* 138* 109* 74*  --   ALT 26 31 36 35  --   ALKPHOS 33* 48 69 102  --   BILITOT 1.1 0.6 0.4 0.3  --   PROT 4.7* 5.2* 5.7* 5.6*  --   ALBUMIN 3.1* 2.8* 2.6* 2.4* 2.4*   Coagulation profile  Recent Labs Lab 07/31/2014  1605 07/28/2014 1743 07/09/2014 1921 07/07/2014 2020 07/25/14 0915  INR 1.63* 0.87 1.36 1.42 1.38    CBG:  Recent Labs Lab 07/29/14 0339 07/29/14 0744 07/29/14 1149 07/29/14 1613 07/29/14 1925  GLUCAP 86 85 195* 129* 155*      Imaging Studies:  Dg Chest Port 1 View  07/30/2014   CLINICAL DATA:  follow-up CHF  EXAM: PORTABLE CHEST - 1 VIEW  COMPARISON:  07/28/2014  FINDINGS: Previous CABG, valve replacement surgery, and left apical clip placement. Bilateral chest tubes in place with no pneumothorax. Right PICC line stable. Right IJ central line has been removed. Mediastinal drain has been removed. Patchy airspace opacities in mid lung zones left greater than right persist. No definite effusion although the left lateral costophrenic angle is obscured by overlying monitoring hardware. Heart size remains normal.  Atheromatous aorta.  IMPRESSION: 1. Removal of right IJ central line and mediastinal drain. Otherwise stable appearance since previous exam.   Electronically Signed   By: Arne Cleveland M.D.   On: 07/30/2014 07:53    Assessment/Plan: 80 y.o.   Thrombocytopenia  likely due to endocarditis  His platelet continued to stabilize. His platelet count was 272,000 on 8/27 and 242,000 today Prednisone discontinued on 8/27. Continue to monitor.        Anemia:         Hgb stable, has received Aranesp.        Hb was 10.2 on 8/27 and 10 on 8/28        He is mildly iron deficient. His iron saturation is only 18%.He received IV iron yesterday and today.        Continue Folic Acid  Acute on CKD stage 2 Renal Failure       AG metabolic acidosis       Hypokalemia As per Renal Service  Enterococcus endocarditis:  s/p MVR, Aortic root replacement and CABG  As per ID service  On dual B lactam therapy with ampicillin and ceftriaxone.  Leukocytosis In the setting of infection, inflammation and recent steroids WBC is trending to normal at 11.3 (was 12.4 yesterday) No intervention is indicated  DVT Prophylaxis On Lovenox and ASA  Other medical issues as per admitting team     **Disclaimer: This note was dictated with voice recognition software. Similar sounding words can inadvertently be transcribed and this note may contain transcription errors which may not have been corrected upon publication of note.Sharene Butters E, PA-C 07/15/2014  9:43 AM

## 2014-07-31 NOTE — Progress Notes (Signed)
[facility]  Date of Admission:  08/03/2014  Antibiotics: Ampicillin ceftriaxone  Subjective: No acute events  Objective: Temp:  [97.5 F (36.4 C)-98.5 F (36.9 C)] 97.5 F (36.4 C) (08/28 1200) Pulse Rate:  [84-92] 91 (08/28 0700) Resp:  [15-24] 17 (08/28 0700) BP: (93-133)/(46-71) 122/69 mmHg (08/28 0500) SpO2:  [96 %-100 %] 98 % (08/28 0700) Weight:  [198 lb 3.1 oz (89.9 kg)] 198 lb 3.1 oz (89.9 kg) (08/28 0500)  General: awake, alert, nad Skin: no rashes, sternal incision noted  Lungs: CTA B Cor: RRR Ext: no edema  Lab Results Lab Results  Component Value Date   WBC 11.3* 07/24/2014   HGB 10.0* 07/20/2014   HCT 30.0* 07/30/2014   MCV 100.3* 07/15/2014   PLT 242 07/06/2014    Lab Results  Component Value Date   CREATININE 3.16* 07/10/2014   BUN 62* 07/15/2014   NA 140 07/24/2014   K 3.7 07/20/2014   CL 98 07/16/2014   CO2 27 07/27/2014    Lab Results  Component Value Date   ALT 35 07/28/2014   AST 74* 07/28/2014   ALKPHOS 102 07/28/2014   BILITOT 0.3 07/28/2014      Microbiology: Recent Results (from the past 240 hour(s))  GRAM STAIN     Status: None   Collection Time    07/12/2014  9:36 AM      Result Value Ref Range Status   Specimen Description TISSUE   Final   Special Requests AORTIC VALVE VEGETATION HEART PT ON ZINACEF VANCO   Final   Gram Stain     Final   Value: FEW WBC PRESENT,BOTH PMN AND MONONUCLEAR     ABUNDANT GRAM POSITIVE COCCI IN PAIRS   Report Status 07/14/2014 FINAL   Final  TISSUE CULTURE     Status: None   Collection Time    07/06/2014  9:36 AM      Result Value Ref Range Status   Specimen Description TISSUE OTHER   Final   Special Requests AORTIC VALVE VEGETATION   Final   Gram Stain     Final   Value: NO WBC SEEN     NO SQUAMOUS EPITHELIAL CELLS SEEN     NO ORGANISMS SEEN     Performed at [facility]   Culture     Final   Value: FEW ENTEROCOCCUS SPECIES     Performed at [facility]   Report Status 07/29/2014 FINAL   Final   Organism ID, Bacteria ENTEROCOCCUS SPECIES   Final  ANAEROBIC CULTURE     Status: None   Collection Time    08/02/2014  9:36 AM      Result Value Ref Range Status   Specimen Description TISSUE OTHER   Final   Special Requests AORTIC VALVE VEGETATION   Final   Gram Stain     Final   Value: NO WBC SEEN     NO SQUAMOUS EPITHELIAL CELLS SEEN     NO ORGANISMS SEEN     Performed at [facility]   Culture     Final   Value: NO ANAEROBES ISOLATED     Performed at [facility]   Report Status 07/29/2014 FINAL   Final  CLOSTRIDIUM DIFFICILE BY PCR     Status: None   Collection Time    07/21/2014  6:18 AM      Result Value Ref Range Status   C difficile by pcr NEGATIVE  NEGATIVE Final    Studies/Results: Dg Chest Port 1 View  07/27/2014   CLINICAL DATA:  New left PICC  EXAM: PORTABLE CHEST - 1 VIEW  COMPARISON:  07/30/2014  FINDINGS: Left PICC has its tip in the lower superior vena cava, well positioned. The tip lies adjacent to the indwelling right PICC, which is unchanged from the prior study.  Changes from cardiac surgery are stable.  Left and right chest tubes are stable.  No pneumothorax.  Mild irregular interstitial thickening with patchy hazy airspace opacity is noted in the left mid lung, without change. Right mid lung zone patchy airspace opacity has improved, likely improved atelectasis.  IMPRESSION: 1. New left PICC is well positioned. 2. Improved small area patchy airspace opacity in the right mid lung, likely improved atelectasis. 3. No other change.  No pneumothorax.   Electronically Signed   By: Lajean Manes M.D.   On: 07/21/2014 10:22   Dg Chest Port 1 View  07/30/2014   CLINICAL DATA:  follow-up CHF  EXAM: PORTABLE CHEST - 1 VIEW  COMPARISON:  07/28/2014  FINDINGS: Previous CABG, valve replacement surgery, and left apical clip placement. Bilateral chest tubes in place with no pneumothorax. Right PICC line stable. Right IJ  central line has been removed. Mediastinal drain has been removed. Patchy airspace opacities in mid lung zones left greater than right persist. No definite effusion although the left lateral costophrenic angle is obscured by overlying monitoring hardware. Heart size remains normal. Atheromatous aorta.  IMPRESSION: 1. Removal of right IJ central line and mediastinal drain. Otherwise stable appearance since previous exam.   Electronically Signed   By: Arne Cleveland M.D.   On: 07/30/2014 07:53    Assessment/Plan: 1) Enterococcal endocarditis s/p valve replacement - on dual beta lactam therapy with ampicillin and ceftriaxone.  Valve growing Enterococcus, amp sensitive.   Creat stable, slight improvement.    Dr. Tommy Medal available over the weekend if needed, I will follow up on Monday.  Scharlene Gloss, [facility] [place] [email or web address] [phone] pager   [phone] cell 07/17/2014, 3:11 PM

## 2014-08-01 LAB — BASIC METABOLIC PANEL
Anion gap: 13 (ref 5–15)
BUN: 63 mg/dL — AB (ref 6–23)
CALCIUM: 8.1 mg/dL — AB (ref 8.4–10.5)
CO2: 28 mEq/L (ref 19–32)
CREATININE: 3.01 mg/dL — AB (ref 0.50–1.35)
Chloride: 99 mEq/L (ref 96–112)
GFR calc non Af Amer: 18 mL/min — ABNORMAL LOW (ref >90)
GFR, EST AFRICAN AMERICAN: 21 mL/min — AB (ref >90)
Glucose, Bld: 127 mg/dL — ABNORMAL HIGH (ref 70–99)
Potassium: 3.1 mEq/L — ABNORMAL LOW (ref 3.7–5.3)
Sodium: 140 mEq/L (ref 137–147)

## 2014-08-01 LAB — CBC
HEMATOCRIT: 28.5 % — AB (ref 39.0–52.0)
Hemoglobin: 9.2 g/dL — ABNORMAL LOW (ref 13.0–17.0)
MCH: 32.4 pg (ref 26.0–34.0)
MCHC: 32.3 g/dL (ref 30.0–36.0)
MCV: 100.4 fL — AB (ref 78.0–100.0)
PLATELETS: 216 10*3/uL (ref 150–400)
RBC: 2.84 MIL/uL — ABNORMAL LOW (ref 4.22–5.81)
RDW: 23 % — AB (ref 11.5–15.5)
WBC: 8.9 10*3/uL (ref 4.0–10.5)

## 2014-08-01 LAB — PROTIME-INR
INR: 1.16 (ref 0.00–1.49)
Prothrombin Time: 14.8 seconds (ref 11.6–15.2)

## 2014-08-01 MED ORDER — PANTOPRAZOLE SODIUM 40 MG PO TBEC
40.0000 mg | DELAYED_RELEASE_TABLET | Freq: Every day | ORAL | Status: DC
  Administered 2014-08-01 – 2014-08-27 (×27): 40 mg via ORAL
  Filled 2014-08-01 (×26): qty 1

## 2014-08-01 MED ORDER — FUROSEMIDE 10 MG/ML IJ SOLN
80.0000 mg | Freq: Two times a day (BID) | INTRAMUSCULAR | Status: DC
  Administered 2014-08-01 (×2): 80 mg via INTRAVENOUS
  Filled 2014-08-01 (×3): qty 8

## 2014-08-01 MED ORDER — ATORVASTATIN CALCIUM 10 MG PO TABS
10.0000 mg | ORAL_TABLET | Freq: Every evening | ORAL | Status: DC
  Administered 2014-08-01 – 2014-08-30 (×30): 10 mg via ORAL
  Filled 2014-08-01 (×32): qty 1

## 2014-08-01 MED ORDER — VITAMIN B-12 1000 MCG PO TABS
1000.0000 ug | ORAL_TABLET | Freq: Every evening | ORAL | Status: DC
  Administered 2014-08-01 – 2014-08-30 (×30): 1000 ug via ORAL
  Filled 2014-08-01 (×33): qty 1

## 2014-08-01 MED ORDER — VITAMIN E 180 MG (400 UNIT) PO CAPS
400.0000 [IU] | ORAL_CAPSULE | Freq: Every day | ORAL | Status: DC
  Administered 2014-08-01 – 2014-08-30 (×30): 400 [IU] via ORAL
  Filled 2014-08-01 (×32): qty 1

## 2014-08-01 MED ORDER — POTASSIUM CHLORIDE 10 MEQ/50ML IV SOLN
10.0000 meq | INTRAVENOUS | Status: AC
  Administered 2014-08-01 (×5): 10 meq via INTRAVENOUS
  Filled 2014-08-01: qty 50

## 2014-08-01 MED ORDER — AMIODARONE HCL 200 MG PO TABS
200.0000 mg | ORAL_TABLET | Freq: Two times a day (BID) | ORAL | Status: DC
  Administered 2014-08-01 – 2014-08-13 (×25): 200 mg via ORAL
  Filled 2014-08-01 (×28): qty 1

## 2014-08-01 MED ORDER — VITAMIN B-6 100 MG PO TABS
100.0000 mg | ORAL_TABLET | Freq: Every evening | ORAL | Status: DC
  Administered 2014-08-01 – 2014-08-30 (×30): 100 mg via ORAL
  Filled 2014-08-01 (×31): qty 1

## 2014-08-01 MED ORDER — POTASSIUM CHLORIDE 20 MEQ/15ML (10%) PO LIQD
20.0000 meq | Freq: Three times a day (TID) | ORAL | Status: DC
  Administered 2014-08-01 – 2014-08-02 (×4): 20 meq via ORAL
  Administered 2014-08-02: 40 meq via ORAL
  Administered 2014-08-03 (×3): 20 meq via ORAL
  Filled 2014-08-01 (×11): qty 15

## 2014-08-01 MED ORDER — METOPROLOL TARTRATE 12.5 MG HALF TABLET: 12.5000 mg | ORAL_TABLET | Freq: Two times a day (BID) | ORAL | Status: DC

## 2014-08-01 NOTE — Progress Notes (Signed)
[address]       [place] [address]             [phone]        CARDIOTHORACIC SURGERY PROGRESS NOTE  R8 Days Post-Op Procedure(s) (LRB):  ROOT REPLACEMENT WITH BIOPROSTHETIC PORCINE AORTIC ROOT REIMPLANTATION OF LEFT MAIN AND RIGHT CORONARY ARTERIES (N/A)  INTRAOPERATIVE TRANSESOPHAGEAL ECHOCARDIOGRAM (N/A)  MAZE (N/A)  CORONARY ARTERY BYPASS GRAFTING (CABG), on pump, times one, using right greater saphenous vein harvested endoscopically. (N/A)  MITRAL VALVE (MV) REPLACEMENT (N/A)   R1 Day Post-Op Procedure(s) (LRB): PERMANENT PACEMAKER INSERTION (N/A)  Subjective: Feels well.  No complaints.  Hasn't received breakfast yet.  Objective: Vital signs: BP Readings from Last 1 Encounters:  08/01/14 109/65   Pulse Readings from Last 1 Encounters:  08/01/14 91   Resp Readings from Last 1 Encounters:  08/01/14 21   Temp Readings from Last 1 Encounters:  08/01/14 97.9 F (36.6 C) Oral    Hemodynamics:    Physical Exam:  Rhythm:   Sinus w/ CHB, AV pacing  Breath sounds: clear  Heart sounds:  RRR  Incisions:  Clean and dry  Abdomen:  Soft, non-distended, non-tender  Extremities:  Warm, well-perfused, swollen   Intake/Output from previous day: 08/28 0701 - 08/29 0700 In: 1677.5 [P.O.:360; I.V.:767.5; IV Piggyback:550] Out: 7416 [LAGTX:6468; Chest Tube:520] Intake/Output this shift: Total I/O In: 100 [I.V.:50; IV Piggyback:50] Out: -   Lab Results:  CBC: Recent Labs  07/29/2014 0530 08/01/14 0430  WBC 11.3* 8.9  HGB 10.0* 9.2*  HCT 30.0* 28.5*  PLT 242 216    BMET:  Recent Labs  07/12/2014 0530 08/01/14 0430  NA 140 140  K 3.7 3.1*  CL 98 99  CO2 27 28  GLUCOSE 112* 127*  BUN 62* 63*  CREATININE 3.16* 3.01*  CALCIUM 8.3* 8.1*     CBG (last 3)   Recent Labs  07/29/14 1149 07/29/14 1613 07/29/14 1925  GLUCAP 195* 129* 155*    ABG    Component Value Date/Time   PHART 7.395 07/25/2014 2059   PCO2ART 31.8*  07/25/2014 2059   PO2ART 65.0* 07/25/2014 2059   HCO3 19.5* 07/25/2014 2059   TCO2 20 07/25/2014 2059   ACIDBASEDEF 5.0* 07/25/2014 2059   O2SAT 93.0 07/25/2014 2059    CXR: PORTABLE CHEST - 1 VIEW  COMPARISON: 07/12/2014  FINDINGS:  Right pacer has in place with leads in the right atrium and right  ventricle. Left chest tube and left PICC line remain in place,  unchanged. No pneumothorax. Patchy airspace disease in the left lung  again noted, stable. Minimal right base atelectasis.  IMPRESSION:  Right pacer placement. No pneumothorax. Otherwise no change.  Electronically Signed  By: Rolm Baptise M.D.  On: 07/22/2014 21:35   Assessment/Plan:  Overall doing fairly well POD8 Bacterial endocarditis s/p AVR+MVR - OR cultures growing Enterococcus species sensitive to Amp and Vanc - now on Ampicillin + Rocephin Hemodynamically stable  CHB s/p replacement of perm pacer yesterday, pacer functioning normally Acute renal failure on underlying chronic kidney disease, non-oliguric, creatinine slowly trending down, UOP decreased since lasix drip stopped Expected post op volume excess, slowly improving, weight stable last 24 hours Chest tubes still draining serous fluid, trending down Expected post op acute blood loss anemia, mild, stable Expected post op atelectasis, mild, L>R Post-op hypokalemia, diuretic-induced Protein-depleted malnutrition, pre and post-op Pre-op high-dose steroids, now off Prednisone Diarrhea yesterday, already improved after stool softeners stopped  Continue intermittent lasix dosing   D/C Foley   Leave chest tubes 1 more day  Supplement potassium  Mobilize as much as possible  PT consult  Encourage po's and add supplements  Antibiotics per ID team  Continue low dose lovenox for DVT prophylaxis and watch platelet count, continue ASA 81 mg/day for now   Coumadin  Transfer step down  Brydon Spahr H 08/01/2014 9:46 AM

## 2014-08-01 NOTE — Progress Notes (Signed)
Patient ID: Justin [name], male   DOB: 09/26/1933, 80 y.o.   MRN: [id number]  [facility] Progress Note    Assessment/ Plan:    1. Acute on CKD stage 2 Renal Failure: Baseline Scr ~1.2. Urine output lower after conversion from Lasix drip-increase bolus dose to 80 mg twice a day. Remains with significant edema and slowly improving renal function. 2. Hypokalemia- due to diuretic therapy/diarrhea losses, ongoing intravenous repletion-we'll place on scheduled oral dose 3. AG metabolic acidosis: secondary to #1. Imporved 4. Anemia: Heme following, Hgb stable, has received Aranesp. IV iron repeated today. 5. Thrombocytopenia: likely due to endocarditis now resolved. Prednisone discontinued. 6. Enterococcus endocarditis: s/p MVR, Aortic root replacement, CABG, ID following: dual B lactam therapy with ampicillin and ceftriaxone.   Subjective:   Underwent uneventful pacemaker placement yesterday and has done well post procedure. Diarrhea seemingly solidifying (C. difficile negative)    Objective:   BP 96/56  Pulse 87  Temp(Src) 97.9 F (36.6 C) (Oral)  Resp 19  Ht 5\' 11"  (1.803 m)  Wt 89.268 kg (196 lb 12.8 oz)  BMI 27.46 kg/m2  SpO2 97%  Intake/Output Summary (Last 24 hours) at 08/01/14 6579 Last data filed at 08/01/14 0700  Gross per 24 hour  Intake 1667.5 ml  Output   1865 ml  Net -197.5 ml   Weight change: -0.632 kg (-1 lb 6.3 oz)  Physical Exam: Gen: Comfortably sitting up in his recliner-right arm in splint CVS: Pulse regular in rate and rhythm, S1 and S2 normal. Right upper chest PPM Resp: Decreased breath sounds over bases otherwise clear Abd: Soft, flat, nontender Ext: 2-3+ lower extremity edema  Imaging: Dg Chest Portable 1 View  07/15/2014   CLINICAL DATA:  Post implant.  Evaluate for pneumothorax.  EXAM: PORTABLE CHEST - 1 VIEW  COMPARISON:  07/22/2014  FINDINGS: Right pacer has in place with leads in the right atrium and right ventricle. Left chest  tube and left PICC line remain in place, unchanged. No pneumothorax. Patchy airspace disease in the left lung again noted, stable. Minimal right base atelectasis.  IMPRESSION: Right pacer placement.  No pneumothorax.  Otherwise no change.   Electronically Signed   By: Rolm Baptise M.D.   On: 07/15/2014 21:35   Dg Chest Port 1 View  07/15/2014   CLINICAL DATA:  New left PICC  EXAM: PORTABLE CHEST - 1 VIEW  COMPARISON:  07/30/2014  FINDINGS: Left PICC has its tip in the lower superior vena cava, well positioned. The tip lies adjacent to the indwelling right PICC, which is unchanged from the prior study.  Changes from cardiac surgery are stable.  Left and right chest tubes are stable.  No pneumothorax.  Mild irregular interstitial thickening with patchy hazy airspace opacity is noted in the left mid lung, without change. Right mid lung zone patchy airspace opacity has improved, likely improved atelectasis.  IMPRESSION: 1. New left PICC is well positioned. 2. Improved small area patchy airspace opacity in the right mid lung, likely improved atelectasis. 3. No other change.  No pneumothorax.   Electronically Signed   By: Lajean Manes M.D.   On: 07/04/2014 10:22    Labs: BMET  Recent Labs Lab 07/28/14 0432 07/28/14 1400 07/29/14 0414 07/29/14 1347 07/30/14 0415 07/30/14 1415 07/30/14 2310 07/11/2014 0530 08/01/14 0430  NA 135* 135* 140  --  139 138 138 140 140  K 4.6 4.0 3.1* 3.5* 3.0* 3.2* 3.3* 3.7 3.1*  CL 96 95* 98  --  96 96  --  98 99  CO2 21 22 23   --  26 25  --  27 28  GLUCOSE 95 122* 89  --  116* 142* 104* 112* 127*  BUN 62* 66* 67*  --  64* 62*  --  62* 63*  CREATININE 4.11* 4.04* 3.90*  --  3.42* 3.24*  --  3.16* 3.01*  CALCIUM 7.7* 7.5* 8.0*  --  8.2* 7.6*  --  8.3* 8.1*  PHOS  --   --  7.3*  --   --   --   --   --   --    CBC  Recent Labs Lab 07/29/14 0414 07/30/14 0415 07/30/14 2310 07/08/2014 0530 08/01/14 0430  WBC 9.6 12.4*  --  11.3* 8.9  HGB 9.6* 10.2* 11.6* 10.0*  9.2*  HCT 29.7* 30.6* 34.0* 30.0* 28.5*  MCV 96.1 98.7  --  100.3* 100.4*  PLT 256 272  --  242 216    Medications:    . ampicillin (OMNIPEN) IV  2 g Intravenous 3 times per day  . antiseptic oral rinse  7 mL Mouth Rinse QID  . aspirin EC  81 mg Oral Daily  .  ceFAZolin (ANCEF) IV  1 g Intravenous 3 times per day  . cefTRIAXone (ROCEPHIN)  IV  2 g Intravenous Q12H  . chlorhexidine  15 mL Mouth Rinse BID  . enoxaparin (LOVENOX) injection  30 mg Subcutaneous Q24H  . feeding supplement (ENSURE COMPLETE)  237 mL Oral BID BM  . feeding supplement (PRO-STAT SUGAR FREE 64)  30 mL Oral BID WC  . folic acid  2 mg Oral Daily  . furosemide  80 mg Intravenous BID  . gentamicin irrigation   Irrigation To Cath  . ondansetron (ZOFRAN) IV  8 mg Intravenous 3 times per day  . pantoprazole  40 mg Oral BID  . potassium chloride  10 mEq Intravenous Q1 Hr x 5  . sodium chloride  10-40 mL Intracatheter Q12H  . sodium chloride  3 mL Intravenous Q12H  . tamsulosin  0.4 mg Oral QHS  . warfarin  2.5 mg Oral q1800  . Warfarin - Physician Dosing Inpatient   Does not apply [id number]   Elmarie Shiley, MD 08/01/2014, 8:06 AM

## 2014-08-01 NOTE — Progress Notes (Signed)
Patient ID: TILER BRANDIS, male   DOB: 30-Nov-1933, 80 y.o.   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 Active Problems:   CAD (coronary artery disease)   HTN (hypertension)   Atrial fibrillation   Anemia   Thrombocytopenia   Obstructive sleep apnea   Chronotropic incompetence with sinus node dysfunction   Mitral regurgitation   SOB (shortness of breath)   CHF (congestive heart failure)   Diastolic congestive heart failure   Sepsis   Enterococcal bacteremia   TIA (transient ischemic attack)   Aortic insufficiency   Chronic kidney disease   S/P aortic valve replacement with stentless valve   S/P mitral valve replacement with bioprosthetic valve   S/P CABG x 1   S/P Maze operation for atrial fibrillation   Acute renal failure superimposed on stage 3 chronic kidney disease   Acute on chronic diastolic heart failure    SUBJECTIVE S/p DDD PM insertion doing well. Denies chest pain or sob.No incisional pain.  CURRENT MEDS . ampicillin (OMNIPEN) IV  2 g Intravenous 3 times per day  . antiseptic oral rinse  7 mL Mouth Rinse QID  . aspirin EC  81 mg Oral Daily  .  ceFAZolin (ANCEF) IV  1 g Intravenous 3 times per day  . cefTRIAXone (ROCEPHIN)  IV  2 g Intravenous Q12H  . chlorhexidine  15 mL Mouth Rinse BID  . enoxaparin (LOVENOX) injection  30 mg Subcutaneous Q24H  . feeding supplement (ENSURE COMPLETE)  237 mL Oral BID BM  . feeding supplement (PRO-STAT SUGAR FREE 64)  30 mL Oral BID WC  . folic acid  2 mg Oral Daily  . furosemide  80 mg Intravenous BID  . gentamicin irrigation   Irrigation To Cath  . ondansetron (ZOFRAN) IV  8 mg Intravenous 3 times per day  . pantoprazole  40 mg Oral BID  . potassium chloride  10 mEq Intravenous Q1 Hr x 5  . potassium chloride  20 mEq Oral TID  . sodium chloride  10-40 mL Intracatheter Q12H  . sodium  chloride  3 mL Intravenous Q12H  . tamsulosin  0.4 mg Oral QHS  . warfarin  2.5 mg Oral q1800  . Warfarin - Physician Dosing Inpatient   Does not apply q1800    OBJECTIVE  Filed Vitals:   08/01/14 0600 08/01/14 0700 08/01/14 0741 08/01/14 0800  BP: 96/56 108/57  109/65  Pulse: 89 87  91  Temp:   97.9 F (36.6 C)   TempSrc:   Oral   Resp: 19 19  21   Height:      Weight:      SpO2: 97% 97%  96%    Intake/Output Summary (Last 24 hours) at 08/01/14 0904 Last data filed at 08/01/14 0800  Gross per 24 hour  Intake 1757.5 ml  Output   1735 ml  Net   22.5 ml   Filed Weights   07/30/14 0600 07/08/2014 0500 08/01/14 0500  Weight: 213 lb 6.5 oz (96.8 kg) 198 lb 3.1 oz (89.9 kg) 196 lb 12.8 oz (89.268 kg)    PHYSICAL EXAM  General: Pleasant, alert, NAD. Neuro: Alert and oriented X 3. Moves all extremities spontaneously. HEENT:  Normal  Neck: Supple without bruits or JVD. Lungs:  Resp regular and unlabored, CTA. Heart: RRR no s3, s4, or murmurs. Abdomen: Soft, non-tender, non-distended, BS +  x 4.  Extremities: No clubbing, cyanosis or edema. DP/PT/Radials 2+ and equal bilaterally.  Accessory Clinical Findings  CBC  Recent Labs  07/12/2014 0530 08/01/14 0430  WBC 11.3* 8.9  HGB 10.0* 9.2*  HCT 30.0* 28.5*  MCV 100.3* 100.4*  PLT 242 697   Basic Metabolic Panel  Recent Labs  07/21/2014 0530 08/01/14 0430  NA 140 140  K 3.7 3.1*  CL 98 99  CO2 27 28  GLUCOSE 112* 127*  BUN 62* 63*  CREATININE 3.16* 3.01*  CALCIUM 8.3* 8.1*   Liver Function Tests No results found for this basename: AST, ALT, ALKPHOS, BILITOT, PROT, ALBUMIN,  in the last 72 hours No results found for this basename: LIPASE, AMYLASE,  in the last 72 hours Cardiac Enzymes No results found for this basename: CKTOTAL, CKMB, CKMBINDEX, TROPONINI,  in the last 72 hours BNP No components found with this basename: POCBNP,  D-Dimer No results found for this basename: DDIMER,  in the last 72  hours Hemoglobin A1C No results found for this basename: HGBA1C,  in the last 72 hours Fasting Lipid Panel No results found for this basename: CHOL, HDL, LDLCALC, TRIG, CHOLHDL, LDLDIRECT,  in the last 72 hours Thyroid Function Tests No results found for this basename: TSH, T4TOTAL, FREET3, T3FREE, THYROIDAB,  in the last 72 hours  TELE  nsr with AV pacing   ASSESSMENT AND PLAN 1. CHB 2. S/p PPM insertion Rec: ok to progress activity. We will arrange followup once patient goes home/to rehab.   Lukka Black,M.D.  08/01/2014 9:04 AM

## 2014-08-01 NOTE — Progress Notes (Signed)
[facility] BRIEF SICU PROGRESS NOTE  Stable day Feels "weak" but otherwise pretty well  Plan: Awaiting bed on step down unit for transfer  Justin [name] 08/01/2014 5:31 PM

## 2014-08-02 ENCOUNTER — Inpatient Hospital Stay (HOSPITAL_COMMUNITY): Payer: Medicare Other

## 2014-08-02 LAB — BASIC METABOLIC PANEL
Anion gap: 15 (ref 5–15)
BUN: 67 mg/dL — ABNORMAL HIGH (ref 6–23)
CO2: 26 meq/L (ref 19–32)
Calcium: 8 mg/dL — ABNORMAL LOW (ref 8.4–10.5)
Chloride: 98 mEq/L (ref 96–112)
Creatinine, Ser: 3.31 mg/dL — ABNORMAL HIGH (ref 0.50–1.35)
GFR calc Af Amer: 19 mL/min — ABNORMAL LOW (ref >90)
GFR, EST NON AFRICAN AMERICAN: 16 mL/min — AB (ref >90)
GLUCOSE: 103 mg/dL — AB (ref 70–99)
Potassium: 3.9 mEq/L (ref 3.7–5.3)
SODIUM: 139 meq/L (ref 137–147)

## 2014-08-02 LAB — CBC
HCT: 29.1 % — ABNORMAL LOW (ref 39.0–52.0)
HEMOGLOBIN: 9.6 g/dL — AB (ref 13.0–17.0)
MCH: 33.8 pg (ref 26.0–34.0)
MCHC: 33 g/dL (ref 30.0–36.0)
MCV: 102.5 fL — ABNORMAL HIGH (ref 78.0–100.0)
Platelets: ADEQUATE 10*3/uL (ref 150–400)
RBC: 2.84 MIL/uL — ABNORMAL LOW (ref 4.22–5.81)
RDW: 23.5 % — ABNORMAL HIGH (ref 11.5–15.5)
WBC: 9.4 10*3/uL (ref 4.0–10.5)

## 2014-08-02 LAB — PROTIME-INR
INR: 1.22 (ref 0.00–1.49)
PROTHROMBIN TIME: 15.4 s — AB (ref 11.6–15.2)

## 2014-08-02 MED ORDER — FUROSEMIDE 80 MG PO TABS
80.0000 mg | ORAL_TABLET | Freq: Two times a day (BID) | ORAL | Status: DC
  Administered 2014-08-02: 80 mg via ORAL
  Filled 2014-08-02 (×3): qty 1

## 2014-08-02 NOTE — Progress Notes (Signed)
Spoke with patients daughter today in great length regarding discharge concerns. Daughter states that patient and spouse reside in [place] (retirement community). If patient is going to require abx after discharge and need skilled care, they would like for him to get therapy/treatments at [facility] skilled facility. RN assured family that wishes would be passed along to SW and CM. Alfredo Bach RN BSN 08/02/2014 7:15 PM

## 2014-08-02 NOTE — Progress Notes (Signed)
Pt ambulated 300 feet using front wheel walker on room air  with RN,tolerated well,O2 saturation post ambulation is 94%.Back to bed,will continue to monitor.

## 2014-08-02 NOTE — Progress Notes (Addendum)
[address]       ,[place] [address]             [phone]      2 Days Post-Op Procedure(s) (LRB): PERMANENT PACEMAKER INSERTION (N/A) Subjective: conts to slowly progress  Objective: Vital signs in last 24 hours: Temp:  [97.6 F (36.4 C)-98.4 F (36.9 C)] 98 F (36.7 C) (08/30 0612) Pulse Rate:  [59-104] 91 (08/30 0612) Cardiac Rhythm:  [-] A-V Sequential paced (08/29 2000) Resp:  [15-25] 18 (08/30 0612) BP: (93-133)/(52-75) 94/52 mmHg (08/30 0612) SpO2:  [94 %-100 %] 96 % (08/30 0612) Weight:  [199 lb 11.8 oz (90.6 kg)] 199 lb 11.8 oz (90.6 kg) (08/30 0100)  Hemodynamic parameters for last 24 hours:    Intake/Output from previous day: 08/29 0701 - 08/30 0700 In: 0932 [P.O.:840; I.V.:50; IV Piggyback:200] Out: 1325 [Urine:905; Chest Tube:420] Intake/Output this shift:    General appearance: alert, cooperative and no distress Heart: regular rate and rhythm Lungs: clear to auscultation bilaterally Abdomen: soft, non-tender Extremities: + edema Wound: incis healing well  Lab Results:  Recent Labs  08/01/14 0430 08/02/14 0405  WBC 8.9 9.4  HGB 9.2* 9.6*  HCT 28.5* 29.1*  PLT 216 PLATELET CLUMPS NOTED ON SMEAR, COUNT APPEARS ADEQUATE   BMET:  Recent Labs  08/01/14 0430 08/02/14 0405  NA 140 139  K 3.1* 3.9  CL 99 98  CO2 28 26  GLUCOSE 127* 103*  BUN 63* 67*  CREATININE 3.01* 3.31*  CALCIUM 8.1* 8.0*    PT/INR:  Recent Labs  08/02/14 0405  LABPROT 15.4*  INR 1.22   ABG    Component Value Date/Time   PHART 7.395 07/25/2014 2059   HCO3 19.5* 07/25/2014 2059   TCO2 20 07/25/2014 2059   ACIDBASEDEF 5.0* 07/25/2014 2059   O2SAT 93.0 07/25/2014 2059   CBG (last 3)  No results found for this basename: GLUCAP,  in the last 72 hours  Meds Scheduled Meds: . amiodarone  200 mg Oral BID  . ampicillin (OMNIPEN) IV  2 g Intravenous 3 times per day  . antiseptic oral rinse  7 mL Mouth Rinse QID  . aspirin EC  81 mg Oral  Daily  . atorvastatin  10 mg Oral QPM  . cefTRIAXone (ROCEPHIN)  IV  2 g Intravenous Q12H  . chlorhexidine  15 mL Mouth Rinse BID  . enoxaparin (LOVENOX) injection  30 mg Subcutaneous Q24H  . feeding supplement (ENSURE COMPLETE)  237 mL Oral BID BM  . feeding supplement (PRO-STAT SUGAR FREE 64)  30 mL Oral BID WC  . folic acid  2 mg Oral Daily  . ondansetron (ZOFRAN) IV  8 mg Intravenous 3 times per day  . pantoprazole  40 mg Oral Daily  . potassium chloride  20 mEq Oral TID  . pyridOXINE  100 mg Oral QPM  . sodium chloride  10-40 mL Intracatheter Q12H  . sodium chloride  3 mL Intravenous Q12H  . tamsulosin  0.4 mg Oral QHS  . vitamin B-12  1,000 mcg Oral QPM  . vitamin E  400 Units Oral Daily  . warfarin  2.5 mg Oral q1800  . Warfarin - Physician Dosing Inpatient   Does not apply q1800   Continuous Infusions: . sodium chloride 10 mL/hr at 07/26/2014 0000   PRN Meds:.acetaminophen, ondansetron (ZOFRAN) IV, potassium chloride, promethazine, sodium chloride, sodium chloride, traMADol  Xrays Dg Chest Portable 1 View  07/25/2014   CLINICAL DATA:  Post implant.  Evaluate for pneumothorax.  EXAM: PORTABLE CHEST - 1 VIEW  COMPARISON:  08/01/2014  FINDINGS: Right pacer has in place with leads in the right atrium and right ventricle. Left chest tube and left PICC line remain in place, unchanged. No pneumothorax. Patchy airspace disease in the left lung again noted, stable. Minimal right base atelectasis.  IMPRESSION: Right pacer placement.  No pneumothorax.  Otherwise no change.   Electronically Signed   By: Rolm Baptise M.D.   On: 07/30/2014 21:35   Dg Chest Port 1 View  07/04/2014   CLINICAL DATA:  New left PICC  EXAM: PORTABLE CHEST - 1 VIEW  COMPARISON:  07/30/2014  FINDINGS: Left PICC has its tip in the lower superior vena cava, well positioned. The tip lies adjacent to the indwelling right PICC, which is unchanged from the prior study.  Changes from cardiac surgery are stable.  Left and  right chest tubes are stable.  No pneumothorax.  Mild irregular interstitial thickening with patchy hazy airspace opacity is noted in the left mid lung, without change. Right mid lung zone patchy airspace opacity has improved, likely improved atelectasis.  IMPRESSION: 1. New left PICC is well positioned. 2. Improved small area patchy airspace opacity in the right mid lung, likely improved atelectasis. 3. No other change.  No pneumothorax.   Electronically Signed   By: Lajean Manes M.D.   On: 07/26/2014 10:22    Assessment/Plan: S/P Procedure(s) (LRB): PERMANENT PACEMAKER INSERTION (N/A)  1 stable with steady overall progress 2 CT's with 420 cc drainage- d/c tubes  3 creat remains in 3.0-3.3 range, BUN remains in 60's- good UO, nephrology note from yesterday recs 80 mg BID- will order 4 conts current ABX for enterococcus endocarditis 5 pacing - EP managing 6 rehab as able 7 H/H stable- platelets clumped but adequate count 8 loose stools improving 9 cont coumadin  LOS: 27 days    GOLD,WAYNE E 08/02/2014  I have seen and examined the patient and agree with the assessment and plan as outlined.  However, will hold one dose lasix today. Although weight is still a few kg above pre-op and legs are swollen, I am concerned by relatively low BP and overnight rise in BUN/creatinine that patient might be relatively intravascularly volume depleted - which was also noted earlier today by Dr Posey Pronto.  Mobilization of 3rd space volume may be further prolonged due to severe protein depletion.  Timiyah Romito H 08/02/2014 10:38 AM

## 2014-08-02 NOTE — Progress Notes (Signed)
Patient ID: Justin [name], male   DOB: 05/15/1933, 80 y.o.   MRN: [id number]  [facility] Progress Note   Assessment/ Plan:   1. Acute on CKD stage 2 Renal Failure: Baseline Scr ~1.2. Urine output slightly less than 1 L on furosemide 80 mg twice a day (420 cc of chest tube output-net -200 cc). Renal function slightly worse possibly from mild intravascular volume contraction/RAS activation from ongoing diuretic use. Continue to monitor closely anticipating renal recovery (transient hypotension noted that may delay this). 2. Hypokalemia- due to diuretic therapy/diarrhea losses, corrected with intravenous/oral therapy 3. Complete heart block: s/p PPM placement 2 days ago and doing better per cardiology note 4. Anemia: Heme following, Hgb improving slowly status post intravenous iron and Aranesp. 5. Thrombocytopenia: likely due to endocarditis now resolved. Prednisone discontinued. 6. Enterococcus endocarditis: s/p MVR, Aortic root replacement, CABG, ID following: dual B lactam therapy with ampicillin and ceftriaxone.  Subjective:   Reports that he continues to feel better with improving appetite but strength/energy levels are still lagging. Denies any further diarrhea.   Objective:   BP 94/52  Pulse 91  Temp(Src) 98 F (36.7 C) (Oral)  Resp 18  Ht 5\' 11"  (1.803 m)  Wt 90.6 kg (199 lb 11.8 oz)  BMI 27.87 kg/m2  SpO2 96%  Intake/Output Summary (Last 24 hours) at 08/02/14 6295 Last data filed at 08/02/14 0645  Gross per 24 hour  Intake    990 ml  Output   1245 ml  Net   -255 ml   Weight change: 1.332 kg (2 lb 15 oz)  Physical Exam: Gen: Comfortably sleeping in bed CVS: Pulse regular in rate and rhythm, S1 and S2 normal. Right upper chest PPM Resp: Decreased breath sounds over bases otherwise clear Abd: Soft, flat, nontender Ext: 2-3+ lower extremity edema  Imaging: Dg Chest Portable 1 View  07/20/2014   CLINICAL DATA:  Post implant.  Evaluate for pneumothorax.   EXAM: PORTABLE CHEST - 1 VIEW  COMPARISON:  08/01/2014  FINDINGS: Right pacer has in place with leads in the right atrium and right ventricle. Left chest tube and left PICC line remain in place, unchanged. No pneumothorax. Patchy airspace disease in the left lung again noted, stable. Minimal right base atelectasis.  IMPRESSION: Right pacer placement.  No pneumothorax.  Otherwise no change.   Electronically Signed   By: Rolm Baptise M.D.   On: 07/10/2014 21:35   Dg Chest Port 1 View  07/20/2014   CLINICAL DATA:  New left PICC  EXAM: PORTABLE CHEST - 1 VIEW  COMPARISON:  07/30/2014  FINDINGS: Left PICC has its tip in the lower superior vena cava, well positioned. The tip lies adjacent to the indwelling right PICC, which is unchanged from the prior study.  Changes from cardiac surgery are stable.  Left and right chest tubes are stable.  No pneumothorax.  Mild irregular interstitial thickening with patchy hazy airspace opacity is noted in the left mid lung, without change. Right mid lung zone patchy airspace opacity has improved, likely improved atelectasis.  IMPRESSION: 1. New left PICC is well positioned. 2. Improved small area patchy airspace opacity in the right mid lung, likely improved atelectasis. 3. No other change.  No pneumothorax.   Electronically Signed   By: Lajean Manes M.D.   On: 07/14/2014 10:22    Labs: BMET  Recent Labs Lab 07/28/14 1400 07/29/14 0414 07/29/14 1347 07/30/14 0415 07/30/14 1415 07/30/14 2310 07/18/2014 0530 08/01/14 0430 08/02/14 0405  NA  135* 140  --  139 138 138 140 140 139  K 4.0 3.1* 3.5* 3.0* 3.2* 3.3* 3.7 3.1* 3.9  CL 95* 98  --  96 96  --  98 99 98  CO2 22 23  --  26 25  --  27 28 26   GLUCOSE 122* 89  --  116* 142* 104* 112* 127* 103*  BUN 66* 67*  --  64* 62*  --  62* 63* 67*  CREATININE 4.04* 3.90*  --  3.42* 3.24*  --  3.16* 3.01* 3.31*  CALCIUM 7.5* 8.0*  --  8.2* 7.6*  --  8.3* 8.1* 8.0*  PHOS  --  7.3*  --   --   --   --   --   --   --     CBC  Recent Labs Lab 07/30/14 0415 07/30/14 2310 07/04/2014 0530 08/01/14 0430 08/02/14 0405  WBC 12.4*  --  11.3* 8.9 9.4  HGB 10.2* 11.6* 10.0* 9.2* 9.6*  HCT 30.6* 34.0* 30.0* 28.5* 29.1*  MCV 98.7  --  100.3* 100.4* 102.5*  PLT 272  --  242 216 PLATELET CLUMPS NOTED ON SMEAR, COUNT APPEARS ADEQUATE    Medications:    . amiodarone  200 mg Oral BID  . ampicillin (OMNIPEN) IV  2 g Intravenous 3 times per day  . antiseptic oral rinse  7 mL Mouth Rinse QID  . aspirin EC  81 mg Oral Daily  . atorvastatin  10 mg Oral QPM  . cefTRIAXone (ROCEPHIN)  IV  2 g Intravenous Q12H  . chlorhexidine  15 mL Mouth Rinse BID  . enoxaparin (LOVENOX) injection  30 mg Subcutaneous Q24H  . feeding supplement (ENSURE COMPLETE)  237 mL Oral BID BM  . feeding supplement (PRO-STAT SUGAR FREE 64)  30 mL Oral BID WC  . folic acid  2 mg Oral Daily  . furosemide  80 mg Oral BID  . ondansetron (ZOFRAN) IV  8 mg Intravenous 3 times per day  . pantoprazole  40 mg Oral Daily  . potassium chloride  20 mEq Oral TID  . pyridOXINE  100 mg Oral QPM  . sodium chloride  10-40 mL Intracatheter Q12H  . sodium chloride  3 mL Intravenous Q12H  . tamsulosin  0.4 mg Oral QHS  . vitamin B-12  1,000 mcg Oral QPM  . vitamin E  400 Units Oral Daily  . warfarin  2.5 mg Oral q1800  . Warfarin - Physician Dosing Inpatient   Does not apply [id number]   Elmarie Shiley, MD 08/02/2014, 9:09 AM

## 2014-08-02 NOTE — Progress Notes (Signed)
Pt ambulated 350 feet with NT and front wheel walker; pt back to room to bed; will cont. To monitor.

## 2014-08-03 ENCOUNTER — Inpatient Hospital Stay (HOSPITAL_COMMUNITY): Payer: Medicare Other

## 2014-08-03 LAB — CBC
HCT: 30.9 % — ABNORMAL LOW (ref 39.0–52.0)
HEMOGLOBIN: 9.8 g/dL — AB (ref 13.0–17.0)
MCH: 33 pg (ref 26.0–34.0)
MCHC: 31.7 g/dL (ref 30.0–36.0)
MCV: 104 fL — ABNORMAL HIGH (ref 78.0–100.0)
Platelets: 195 10*3/uL (ref 150–400)
RBC: 2.97 MIL/uL — ABNORMAL LOW (ref 4.22–5.81)
RDW: 23.9 % — AB (ref 11.5–15.5)
WBC: 12.1 10*3/uL — ABNORMAL HIGH (ref 4.0–10.5)

## 2014-08-03 LAB — BASIC METABOLIC PANEL
ANION GAP: 14 (ref 5–15)
BUN: 66 mg/dL — ABNORMAL HIGH (ref 6–23)
CO2: 25 mEq/L (ref 19–32)
CREATININE: 3.26 mg/dL — AB (ref 0.50–1.35)
Calcium: 8.2 mg/dL — ABNORMAL LOW (ref 8.4–10.5)
Chloride: 100 mEq/L (ref 96–112)
GFR calc non Af Amer: 17 mL/min — ABNORMAL LOW (ref >90)
GFR, EST AFRICAN AMERICAN: 19 mL/min — AB (ref >90)
Glucose, Bld: 112 mg/dL — ABNORMAL HIGH (ref 70–99)
Potassium: 4.5 mEq/L (ref 3.7–5.3)
Sodium: 139 mEq/L (ref 137–147)

## 2014-08-03 LAB — PROTIME-INR
INR: 1.23 (ref 0.00–1.49)
PROTHROMBIN TIME: 15.5 s — AB (ref 11.6–15.2)

## 2014-08-03 MED ORDER — ONDANSETRON HCL 4 MG PO TABS
4.0000 mg | ORAL_TABLET | Freq: Four times a day (QID) | ORAL | Status: DC | PRN
  Administered 2014-08-04: 4 mg via ORAL
  Filled 2014-08-03: qty 1

## 2014-08-03 MED ORDER — ONDANSETRON HCL 4 MG/2ML IJ SOLN: 4.0000 mg | Freq: Four times a day (QID) | INTRAMUSCULAR | Status: DC | PRN

## 2014-08-03 MED ORDER — ONDANSETRON 8 MG/NS 50 ML IVPB
8.0000 mg | Freq: Four times a day (QID) | INTRAVENOUS | Status: DC | PRN
  Filled 2014-08-03: qty 8

## 2014-08-03 MED ORDER — WARFARIN VIDEO
Freq: Once | Status: AC
  Administered 2014-08-03: 15:00:00

## 2014-08-03 NOTE — Progress Notes (Addendum)
CARDIAC REHAB PHASE I   PRE:  Rate/Rhythm: 76 Pacing  BP:  Supine:   Sitting: 96/60  Standing:    SaO2: 99 RA  MODE:  Ambulation: 280 ft   POST:  Rate/Rhythm: 85 Pacing  BP:  Supine:   Sitting: 12070  Standing:    SaO2: 95 RA 1100-1140 Assisted X 1 and used walker and gait belt to ambulate. Pt c/o of feeling weak and SOB walking. His color is pale walking. I could not get get his oxygen saturation to register in hall. Pt exhausted by end of walk, back to recliner with call light in reach. RA sat when back to room 95%.  Rodney Langton RN 08/03/2014 11:37 AM

## 2014-08-03 NOTE — Progress Notes (Signed)
Mr. Trompeter is now out of the ICU. His chest tubes are taken out. He did have a pacemaker placed. There were no complications from this.  His platelet count has been been pretty steady. There is no pleural packet on him today.  His renal function has been a little on the low side. Nephrology is following him along for this.  His appetite is down a little bit. There is no nausea vomiting. It may take a while for the appetite to come back.  He is doing some physical therapy. I think this will definitely be of benefit for him.  He continues on antibiotics. He will need these as an outpatient.  His vital signs all stable. Blood pressure is 109/48. His pulse is 59. Temperature is 98.1. His lungs sound good. Cardiac exam regular rate and rhythm. Has a occasional extra beat. Abdomen is soft. He has no palpable liver or spleen tip. Extremities shows 2-3+ edema bilaterally.  Again, his platelet counts are holding steady. He would have to think that the thrombocytopenia was a byproduct of his endocarditis. I've seen this before but certainly, not to the degree that he had it.  We will continue to follow along. Hopefully, he will be going home soon.  Pete E.  1 Corinthians 15:58

## 2014-08-03 NOTE — Progress Notes (Addendum)
       [address]       [place] [address]             [phone]          3 Days Post-Op Procedure(s) (LRB): PERMANENT PACEMAKER INSERTION (N/A)  Subjective: 'I'm feeling pretty good", appetite improved but still quite weak.   Objective: Vital signs in last 24 hours: Patient Vitals for the past 24 hrs:  BP Temp Temp src Pulse Resp SpO2 Weight  08/03/14 0508 109/48 mmHg 98.1 F (36.7 C) Oral 59 8 100 % 200 lb 6.4 oz (90.9 kg)  08/02/14 2033 101/57 mmHg 97.9 F (36.6 C) Oral 97 18 98 % -  08/02/14 1349 99/59 mmHg 98.1 F (36.7 C) Oral 91 18 96 % -   Current Weight  08/03/14 200 lb 6.4 oz (90.9 kg)  BASELINE WEIGHT: 86 kg    Intake/Output from previous day: 08/30 0701 - 08/31 0700 In: 630 [P.O.:480; IV Piggyback:150] Out: 1051 [Urine:1050; Stool:1]    PHYSICAL EXAM:  Heart: RRR, paced Lungs: Decreased BS in bases Wound: Clean and dry Extremities: +LE edema    Lab Results: CBC: Recent Labs  08/01/14 0430 08/02/14 0405  WBC 8.9 9.4  HGB 9.2* 9.6*  HCT 28.5* 29.1*  PLT 216 PLATELET CLUMPS NOTED ON SMEAR, COUNT APPEARS ADEQUATE   BMET:  Recent Labs  08/02/14 0405 08/03/14 0420  NA 139 139  K 3.9 4.5  CL 98 100  CO2 26 25  GLUCOSE 103* 112*  BUN 67* 66*  CREATININE 3.31* 3.26*  CALCIUM 8.0* 8.2*    PT/INR:  Recent Labs  08/03/14 0420  LABPROT 15.5*  INR 1.23   CXR: FINDINGS:  Status post median sternotomy and CABG. Left atrial appendage clip  and valve replacement. Right chest wall battery pack with lead tips  projecting over the right atrium and right ventricle. Small right  pleural effusion with associated airspace opacity. There are small  biapical pneumothoraces. Aortic atherosclerosis and mild tortuosity.  Heart size upper normal to mildly enlarged. Multilevel degenerative  changes  IMPRESSION:  Postoperative changes as above.  Tiny biapical pneumothoraces.  Small right pleural effusion with associated  airspace opacity;  atelectasis versus pneumonia, increased in the interval.    Assessment/Plan: S/P Procedure(s) (LRB): PERMANENT PACEMAKER INSERTION (N/A)  CV- BPs low normal, paced rhythm s/p ppm placement.  Continue current meds, Coumadin.  Vol overload-  Wt remains elevated and pt edematous. Decent UOP following IV Lasix yesterday.  Continue diuresis.    Acute/Chronic kidney dz- Continue current care, renal following.  BUN/Cr stable.  Enterococcus endocarditis- Continue Amp/Rocephin.  Possibly d/c Foley soon.  Deconditioning- Continue PT/CRPI.  Disp- pt lives in [place] at [facility] but is concerned about returning home immediately.  May need short term SNF and this is offered at [facility].  Will consult SW to assess d/c needs.   LOS: 28 days    COLLINS,GINA H 08/03/2014  I have seen and examined the patient and agree with the assessment and plan as outlined.  Probably could d/c foley.  Patient might be ready for D/C later this week if renal function stabilizes/improves.  He hopes to go to SNF facility [facility] where he currently resides in [place] with his wife.    [name],[name] 08/03/2014 9:16 AM

## 2014-08-03 NOTE — Discharge Instructions (Signed)
Information on my medicine - Coumadin®   (Warfarin) ° °This medication education was reviewed with me or my healthcare representative as part of my discharge preparation.  The pharmacist that spoke with me during my hospital stay was:  Terrelle Ruffolo Stillinger, RPH ° °Why was Coumadin prescribed for you? °Coumadin was prescribed for you because you have a blood clot or a medical condition that can cause an increased risk of forming blood clots. Blood clots can cause serious health problems by blocking the flow of blood to the heart, lung, or brain. Coumadin can prevent harmful blood clots from forming. °As a reminder your indication for Coumadin is:   Stroke Prevention Because Of Atrial Fibrillation ° °What test will check on my response to Coumadin? °While on Coumadin (warfarin) you will need to have an INR test regularly to ensure that your dose is keeping you in the desired range. The INR (international normalized ratio) number is calculated from the result of the laboratory test called prothrombin time (PT). ° °If an INR APPOINTMENT HAS NOT ALREADY BEEN MADE FOR YOU please schedule an appointment to have this lab work done by your health care provider within 7 days. °Your INR goal is usually a number between:  2 to 3 or your provider may give you a more narrow range like 2-2.5.  Ask your health care provider during an office visit what your goal INR is. ° °What  do you need to  know  About  COUMADIN? °Take Coumadin (warfarin) exactly as prescribed by your healthcare provider about the same time each day.  DO NOT stop taking without talking to the doctor who prescribed the medication.  Stopping without other blood clot prevention medication to take the place of Coumadin may increase your risk of developing a new clot or stroke.  Get refills before you run out. ° °What do you do if you miss a dose? °If you miss a dose, take it as soon as you remember on the same day then continue your regularly scheduled  regimen the next day.  Do not take two doses of Coumadin at the same time. ° °Important Safety Information °A possible side effect of Coumadin (Warfarin) is an increased risk of bleeding. You should call your healthcare provider right away if you experience any of the following: °  Bleeding from an injury or your nose that does not stop. °  Unusual colored urine (red or dark brown) or unusual colored stools (red or black). °  Unusual bruising for unknown reasons. °  A serious fall or if you hit your head (even if there is no bleeding). ° °Some foods or medicines interact with Coumadin® (warfarin) and might alter your response to warfarin. To help avoid this: °  Eat a balanced diet, maintaining a consistent amount of Vitamin K. °  Notify your provider about major diet changes you plan to make. °  Avoid alcohol or limit your intake to 1 drink for women and 2 drinks for men per day. °(1 drink is 5 oz. wine, 12 oz. beer, or 1.5 oz. liquor.) ° °Make sure that ANY health care provider who prescribes medication for you knows that you are taking Coumadin (warfarin).  Also make sure the healthcare provider who is monitoring your Coumadin knows when you have started a new medication including herbals and non-prescription products. ° °Coumadin® (Warfarin)  Major Drug Interactions  °Increased Warfarin Effect Decreased Warfarin Effect  °Alcohol (large quantities) °Antibiotics (esp. Septra/Bactrim, Flagyl, Cipro) °Amiodarone (Cordarone) °Aspirin (  ASA) °Cimetidine (Tagamet) °Megestrol (Megace) °NSAIDs (ibuprofen, naproxen, etc.) °Piroxicam (Feldene) °Propafenone (Rythmol SR) °Propranolol (Inderal) °Isoniazid (INH) °Posaconazole (Noxafil) Barbiturates (Phenobarbital) °Carbamazepine (Tegretol) °Chlordiazepoxide (Librium) °Cholestyramine (Questran) °Griseofulvin °Oral Contraceptives °Rifampin °Sucralfate (Carafate) °Vitamin K  ° °Coumadin® (Warfarin) Major Herbal Interactions  °Increased Warfarin Effect Decreased Warfarin Effect    °Garlic °Ginseng °Ginkgo biloba Coenzyme Q10 °Green tea °St. John’s wort   ° °Coumadin® (Warfarin) FOOD Interactions  °Eat a consistent number of servings per week of foods HIGH in Vitamin K °(1 serving = ½ cup)  °Collards (cooked, or boiled & drained) °Kale (cooked, or boiled & drained) °Mustard greens (cooked, or boiled & drained) °Parsley *serving size only = ¼ cup °Spinach (cooked, or boiled & drained) °Swiss chard (cooked, or boiled & drained) °Turnip greens (cooked, or boiled & drained)  °Eat a consistent number of servings per week of foods MEDIUM-HIGH in Vitamin K °(1 serving = 1 cup)  °Asparagus (cooked, or boiled & drained) °Broccoli (cooked, boiled & drained, or raw & chopped) °Brussel sprouts (cooked, or boiled & drained) *serving size only = ½ cup °Lettuce, raw (green leaf, endive, romaine) °Spinach, raw °Turnip greens, raw & chopped  ° °These websites have more information on Coumadin (warfarin):  www.coumadin.com; °www.ahrq.gov/consumer/coumadin.htm; ° ° ° °

## 2014-08-03 NOTE — Progress Notes (Signed)
[facility] for Infectious Disease  Date of Admission:  07/18/2014  Antibiotics: Ampicillin ceftriaxone  Subjective: No acute events  Objective: Temp:  [97.9 F (36.6 C)-98.1 F (36.7 C)] 98.1 F (36.7 C) (08/31 0508) Pulse Rate:  [59-97] 59 (08/31 0508) Resp:  [8-18] 8 (08/31 0508) BP: (99-109)/(48-59) 109/48 mmHg (08/31 0508) SpO2:  [96 %-100 %] 100 % (08/31 0508) Weight:  [200 lb 6.4 oz (90.9 kg)] 200 lb 6.4 oz (90.9 kg) (08/31 0508)  General: awake, alert, nad Skin: no rashes, sternal incision noted  Lungs: CTA B Cor: RRR Ext: no edema  Lab Results Lab Results  Component Value Date   WBC 12.1* 08/03/2014   HGB 9.8* 08/03/2014   HCT 30.9* 08/03/2014   MCV 104.0* 08/03/2014   PLT 195 08/03/2014    Lab Results  Component Value Date   CREATININE 3.26* 08/03/2014   BUN 66* 08/03/2014   NA 139 08/03/2014   K 4.5 08/03/2014   CL 100 08/03/2014   CO2 25 08/03/2014    Lab Results  Component Value Date   ALT 35 07/28/2014   AST 74* 07/28/2014   ALKPHOS 102 07/28/2014   BILITOT 0.3 07/28/2014      Microbiology: Recent Results (from the past 240 hour(s))  CLOSTRIDIUM DIFFICILE BY PCR     Status: None   Collection Time    07/15/2014  6:18 AM      Result Value Ref Range Status   C difficile by pcr NEGATIVE  NEGATIVE Final    Studies/Results: Dg Chest 2 View  08/03/2014   CLINICAL DATA:  CHF follow-up, pacemaker inserted 3 days ago, open heart surgery 2 weeks ago.  EXAM: CHEST  2 VIEW  COMPARISON:  08/02/2014  FINDINGS: Status post median sternotomy and CABG. Left atrial appendage clip and valve replacement. Right chest wall battery pack with lead tips projecting over the right atrium and right ventricle. Small right pleural effusion with associated airspace opacity. There are small biapical pneumothoraces. Aortic atherosclerosis and mild tortuosity. Heart size upper normal to mildly enlarged. Multilevel degenerative changes  IMPRESSION: Postoperative changes as above.   Tiny biapical pneumothoraces.  Small right pleural effusion with associated airspace opacity; atelectasis versus pneumonia, increased in the interval.   Electronically Signed   By: Carlos Levering M.D.   On: 08/03/2014 06:57   Dg Chest Port 1 View  08/02/2014   CLINICAL DATA:  Status post chest tube removal  EXAM: PORTABLE CHEST - 1 VIEW  COMPARISON:  07/10/2014  FINDINGS: Cardiac shadow is stable. Postsurgical changes are again seen. A pacing device is again noted. A left-sided PICC line is seen in the proximal superior vena cava. The left chest tube has been removed. A tiny residual pneumothorax is noted. Patchy airspace disease in the left lung base is again seen and stable.  IMPRESSION: Tiny residual pneumothorax following chest tube removal.  Stable left basilar airspace disease.   Electronically Signed   By: Inez Catalina M.D.   On: 08/02/2014 11:31    Assessment/Plan: 1) Enterococcal endocarditis s/p valve replacement - on dual beta lactam therapy with ampicillin and ceftriaxone.  Valve growing Enterococcus, amp sensitive.   Creat unchanged.   - Surgery 8/21 and tissue with Enterococcus as expected.  Usual post surgery antibiotic course is 2 weeks, though I would like to expand this to 4-6 weeks post surgery (at least Sept 17) with two valves, pacemaker placement, etc.., to be cautious.  Last positive blood culture 8/3.  We will arrange follow up in [facility] in about 2-3 weeks  I will sign off, please call with questions.     Scharlene Gloss, [facility] for Infectious Disease [place] [email or web address] [phone] pager   [phone] cell 08/03/2014, 11:07 AM

## 2014-08-03 NOTE — Progress Notes (Signed)
    Subjective:  Denies CP or dyspnea; weak   Objective:  Filed Vitals:   08/02/14 0612 08/02/14 1349 08/02/14 2033 08/03/14 0508  BP: 94/52 99/59 101/57 109/48  Pulse: 91 91 97 59  Temp: 98 F (36.7 C) 98.1 F (36.7 C) 97.9 F (36.6 C) 98.1 F (36.7 C)  TempSrc: Oral Oral Oral Oral  Resp: 18 18 18 8   Height:      Weight:    200 lb 6.4 oz (90.9 kg)  SpO2: 96% 96% 98% 100%    Intake/Output from previous day:  Intake/Output Summary (Last 24 hours) at 08/03/14 0801 Last data filed at 08/03/14 0500  Gross per 24 hour  Intake    630 ml  Output   1051 ml  Net   -421 ml    Physical Exam: Physical exam: Well-developed frail in no acute distress.  Skin is warm and dry.  HEENT is normal.  Neck is supple.  Chest is clear to auscultation with normal expansion. Pacemaker site with no hematoma Cardiovascular exam is regular rate and rhythm. 2/6 systolic murmur Abdominal exam nontender or distended. No masses palpated. Extremities show 3+ edema. neuro grossly intact    Lab Results: Basic Metabolic Panel:  Recent Labs  08/02/14 0405 08/03/14 0420  NA 139 139  K 3.9 4.5  CL 98 100  CO2 26 25  GLUCOSE 103* 112*  BUN 67* 66*  CREATININE 3.31* 3.26*  CALCIUM 8.0* 8.2*   CBC:  Recent Labs  08/01/14 0430 08/02/14 0405  WBC 8.9 9.4  HGB 9.2* 9.6*  HCT 28.5* 29.1*  MCV 100.4* 102.5*  PLT 216 PLATELET CLUMPS NOTED ON SMEAR, COUNT APPEARS ADEQUATE     Assessment/Plan:  1 status post aortic root replacement, aortic valve replacement, mitral valve replacement, coronary artery bypassing graft and maze-Progressing well following recent surgery. Needs significant rehab. 2 acute on chronic kidney failure-nephrology following. Significantly volume overloaded. Diuresis per nephrology. 3 thrombocytopenia-improved. Hematology following. 4 paroxysmal atrial fibrillation-patient is in AV paced rhythm today. Continue amiodarone and Coumadin. 5 CAD-continue aspirin and  statin. 6 status post pacemaker 7 endocarditis-continue antibiotics; will need fu with ID  Kirk Ruths 08/03/2014, 8:01 AM

## 2014-08-03 NOTE — Progress Notes (Signed)
Physical Therapy Treatment Patient Details Name: Justin [name] MRN: [id number] DOB: 12-24-1932 Today's Date: 08-13-2014    History of Present Illness Pt is a 80 y.o. male with history of CAD status post stents, hypertension, dyslipidemia, a-fib, pacemaker, ITP currently being treated with IVIG, who presents to the emergency department with complaints of fever and shortness of breath. Pt found to have bacterial endocarditis and underwent MVR, Aortic root replacement, and CABG on 08/03/2014. Pt underwent pacer placement on 8/28.    PT Comments    Pt making steady progress.  Follow Up Recommendations  SNF     Equipment Recommendations  Other (comment) (rollator)    Recommendations for Other Services       Precautions / Restrictions Precautions Precautions: Sternal;Fall;ICD/Pacemaker    Mobility  Bed Mobility                  Transfers Overall transfer level: Needs assistance Equipment used: Rolling walker (2 wheeled) Transfers: Sit to/from Stand Sit to Stand: Mod assist         General transfer comment: Verbal cues for hand placement and assist to bring up hips.  Ambulation/Gait Ambulation/Gait assistance: Min assist Ambulation Distance (Feet): 150 Feet Assistive device: Rolling walker (2 wheeled) Gait Pattern/deviations: Step-through pattern;Decreased step length - right;Decreased step length - left;Trunk flexed Gait velocity: Decreased Gait velocity interpretation: Below normal speed for age/gender General Gait Details: Verbal cues to stand more erect.   Stairs            Wheelchair Mobility    Modified Rankin (Stroke Patients Only)       Balance                                    Cognition Arousal/Alertness: Awake/alert Behavior During Therapy: WFL for tasks assessed/performed Overall Cognitive Status: Within Functional Limits for tasks assessed                      Exercises      General Comments         Pertinent Vitals/Pain Pain Assessment: No/denies pain    Home Living                      Prior Function            PT Goals (current goals can now be found in the care plan section) Progress towards PT goals: Progressing toward goals    Frequency  Min 2X/week    PT Plan Current plan remains appropriate;Frequency needs to be updated    Co-evaluation             End of Session Equipment Utilized During Treatment: Gait belt Activity Tolerance: Patient tolerated treatment well Patient left: in chair;with call bell/phone within reach;with family/visitor present     Time: 1551-1600 PT Time Calculation (min): 9 min  Charges:                       G Codes:      Mahaila Tischer 08-13-14, 4:10 PM  [facility] PT [phone]

## 2014-08-03 NOTE — Progress Notes (Signed)
Chaplain offered emotional and spiritual support to patient and his wife. Patient's primary concern was weakness and exhaustion from not being able to sleep. Provided empathic listening and emotional support.   Ethelene Browns [phone]

## 2014-08-03 NOTE — Progress Notes (Signed)
Assessment/ Plan:   1. Non oliguric- Acute on CKD stage 2 Renal Failure: Baseline Scr ~1.2. Urine output about one liter 2. Hypokalemia- resolved 3. Complete heart block: s/p PPM placement  4. Anemia: Heme following, Hgb improving slowly status post intravenous iron and Aranesp. 5. Thrombocytopenia: likely due to endocarditis now resolved. Prednisone discontinued. 6. Enterococcus endocarditis: s/p MVR, Aortic root replacement, CABG, ID following: dual B lactam therapy with ampicillin and ceftriaxone. 7. Volume overload   Diuretics stopped by Dr. Roxy Manns. Recommend resumption.    Subjective: Interval History: Steady UOP, c/o heavy swollen legs, c/o fatigue  Objective: Vital signs in last 24 hours: Temp:  [97.9 F (36.6 C)-98.1 F (36.7 C)] 98.1 F (36.7 C) (08/31 0508) Pulse Rate:  [59-97] 59 (08/31 0508) Resp:  [8-18] 8 (08/31 0508) BP: (99-109)/(48-59) 109/48 mmHg (08/31 0508) SpO2:  [96 %-100 %] 100 % (08/31 0508) Weight:  [90.9 kg (200 lb 6.4 oz)] 90.9 kg (200 lb 6.4 oz) (08/31 0508) Weight change: 0.3 kg (10.6 oz)  Intake/Output from previous day: 08/30 0701 - 08/31 0700 In: 630 [P.O.:480; IV Piggyback:150] Out: 1051 [Urine:1050; Stool:1] Intake/Output this shift: Total I/O In: -  Out: 1050 [Urine:1050]  General appearance: alert and cooperative Lungs rel clear Cor Ireg Ext marked edema bilat 3-4 +  Lab Results:  Recent Labs  08/01/14 0430 08/02/14 0405  WBC 8.9 9.4  HGB 9.2* 9.6*  HCT 28.5* 29.1*  PLT 216 PLATELET CLUMPS NOTED ON SMEAR, COUNT APPEARS ADEQUATE   BMET:  Recent Labs  08/02/14 0405 08/03/14 0420  NA 139 139  K 3.9 4.5  CL 98 100  CO2 26 25  GLUCOSE 103* 112*  BUN 67* 66*  CREATININE 3.31* 3.26*  CALCIUM 8.0* 8.2*   No results found for this basename: PTH,  in the last 72 hours Iron Studies: No results found for this basename: IRON, TIBC, TRANSFERRIN, FERRITIN,  in the last 72 hours Studies/Results: Dg Chest Port 1 View  08/02/2014    CLINICAL DATA:  Status post chest tube removal  EXAM: PORTABLE CHEST - 1 VIEW  COMPARISON:  07/24/2014  FINDINGS: Cardiac shadow is stable. Postsurgical changes are again seen. A pacing device is again noted. A left-sided PICC line is seen in the proximal superior vena cava. The left chest tube has been removed. A tiny residual pneumothorax is noted. Patchy airspace disease in the left lung base is again seen and stable.  IMPRESSION: Tiny residual pneumothorax following chest tube removal.  Stable left basilar airspace disease.   Electronically Signed   By: Inez Catalina M.D.   On: 08/02/2014 11:31   Scheduled: . amiodarone  200 mg Oral BID  . ampicillin (OMNIPEN) IV  2 g Intravenous 3 times per day  . antiseptic oral rinse  7 mL Mouth Rinse QID  . aspirin EC  81 mg Oral Daily  . atorvastatin  10 mg Oral QPM  . cefTRIAXone (ROCEPHIN)  IV  2 g Intravenous Q12H  . chlorhexidine  15 mL Mouth Rinse BID  . enoxaparin (LOVENOX) injection  30 mg Subcutaneous Q24H  . feeding supplement (ENSURE COMPLETE)  237 mL Oral BID BM  . feeding supplement (PRO-STAT SUGAR FREE 64)  30 mL Oral BID WC  . folic acid  2 mg Oral Daily  . ondansetron (ZOFRAN) IV  8 mg Intravenous 3 times per day  . pantoprazole  40 mg Oral Daily  . potassium chloride  20 mEq Oral TID  . pyridOXINE  100 mg Oral  QPM  . sodium chloride  10-40 mL Intracatheter Q12H  . sodium chloride  3 mL Intravenous Q12H  . tamsulosin  0.4 mg Oral QHS  . vitamin B-12  1,000 mcg Oral QPM  . vitamin E  400 Units Oral Daily  . warfarin  2.5 mg Oral q1800  . Warfarin - Physician Dosing Inpatient   Does not apply q1800     LOS: 28 days   Tank Difiore C 08/03/2014,6:46 AM

## 2014-08-04 DIAGNOSIS — Z7901 Long term (current) use of anticoagulants: Secondary | ICD-10-CM

## 2014-08-04 LAB — PROTIME-INR
INR: 1.46 (ref 0.00–1.49)
PROTHROMBIN TIME: 17.7 s — AB (ref 11.6–15.2)

## 2014-08-04 LAB — CBC
HCT: 29.8 % — ABNORMAL LOW (ref 39.0–52.0)
HEMOGLOBIN: 9.5 g/dL — AB (ref 13.0–17.0)
MCH: 32.5 pg (ref 26.0–34.0)
MCHC: 31.9 g/dL (ref 30.0–36.0)
MCV: 102.1 fL — ABNORMAL HIGH (ref 78.0–100.0)
Platelets: 213 10*3/uL (ref 150–400)
RBC: 2.92 MIL/uL — ABNORMAL LOW (ref 4.22–5.81)
RDW: 23.7 % — AB (ref 11.5–15.5)
WBC: 11.9 10*3/uL — ABNORMAL HIGH (ref 4.0–10.5)

## 2014-08-04 LAB — RENAL FUNCTION PANEL
Albumin: 2 g/dL — ABNORMAL LOW (ref 3.5–5.2)
Anion gap: 14 (ref 5–15)
BUN: 67 mg/dL — ABNORMAL HIGH (ref 6–23)
CO2: 25 mEq/L (ref 19–32)
Calcium: 8.1 mg/dL — ABNORMAL LOW (ref 8.4–10.5)
Chloride: 99 mEq/L (ref 96–112)
Creatinine, Ser: 3.34 mg/dL — ABNORMAL HIGH (ref 0.50–1.35)
GFR, EST AFRICAN AMERICAN: 19 mL/min — AB (ref >90)
GFR, EST NON AFRICAN AMERICAN: 16 mL/min — AB (ref >90)
Glucose, Bld: 104 mg/dL — ABNORMAL HIGH (ref 70–99)
PHOSPHORUS: 3.4 mg/dL (ref 2.3–4.6)
POTASSIUM: 4.9 meq/L (ref 3.7–5.3)
SODIUM: 138 meq/L (ref 137–147)

## 2014-08-04 MED ORDER — FUROSEMIDE 10 MG/ML IJ SOLN
80.0000 mg | Freq: Three times a day (TID) | INTRAMUSCULAR | Status: DC
  Administered 2014-08-04 (×3): 80 mg via INTRAVENOUS
  Filled 2014-08-04 (×7): qty 8

## 2014-08-04 NOTE — Progress Notes (Signed)
Patient ambulated approximately 200 feet around unit.  No shortness of breath was noted.  Tolerated walk well.  Patient back in chair with call button within reach.

## 2014-08-04 NOTE — Progress Notes (Signed)
Justin [name] is doing okay. He now is on Coumadin. Is also on amiodarone. Coumadin will have to be watched closely with his renal insufficiency. His INR today is 1.46. Parasplenic count is excellent. It is sort of 13,000.  He is slightly anemic. Hemoglobin is 9.5. There is no bleeding.  I think his hemoglobin will be on the low side because of his renal insufficiency. I am sure that he will need Aranesp as an outpatient.  He is undergoing cardiac rehabilitation.  To be on antibiotics as an outpatient for about 4 weeks.  Vital signs all of stable. Blood pressure is 170/57. Temperature 98.3. Pulse is 78. Pulse is paced.  At this point, the somewhat for [place] to do. We will formally sign off. His thrombocytopenia has resolved. His anemia will be an ongoing issue with his renal insufficiency. We can follow this up in the office.  I will stop I see him socially. I will certainly continue to pray for him. He's done incredibly well.Marland Kitchen  Pete E.  1 Corinthians 15:58

## 2014-08-04 NOTE — Clinical Social Work Placement (Signed)
Clinical Social Work Department CLINICAL SOCIAL WORK PLACEMENT NOTE 08/03/2014  Patient:  SHAFT, CORIGLIANO  Account Number:  [id number] Admit date:  07/27/2014  Clinical Social Worker:  Barbette Or, LCSW  Date/time:  08/03/2014 04:30 PM  Clinical Social Work is seeking post-discharge placement for this patient at the following level of care:   SKILLED NURSING   (*CSW will update this form in Epic as items are completed)   08/03/2014  Patient/family provided with [facility] Department of Clinical Social Work's list of facilities offering this level of care within the geographic area requested by the patient (or if unable, by the patient's family).  08/03/2014  Patient/family informed of their freedom to choose among providers that offer the needed level of care, that participate in Medicare, Medicaid or managed care program needed by the patient, have an available bed and are willing to accept the patient.  08/03/2014  Patient/family informed of [facility]' ownership interest in [facility], as well as of the fact that they are under no obligation to receive care at this facility.  PASARR submitted to EDS on 08/03/2014 PASARR number received on 08/03/2014  FL2 transmitted to all facilities in geographic area requested by pt/family on  08/05/2014 FL2 transmitted to all facilities within larger geographic area on   Patient informed that his/her managed care company has contracts with or will negotiate with  certain facilities, including the following:     Patient/family informed of bed offers received:   Patient chooses bed at  Physician recommends and patient chooses bed at    Patient to be transferred to  on   Patient to be transferred to facility by  Patient and family notified of transfer on  Name of family member notified:    The following physician request were entered in Epic:   Additional Comments:

## 2014-08-04 NOTE — Clinical Social Work Note (Signed)
Clinical Social Work Department BRIEF PSYCHOSOCIAL ASSESSMENT 08/03/2014  Patient:  Justin [name], Justin [name]     Account Number:  [id number]     Admit date:  07/30/2014  Clinical Social Worker:  Myles Lipps  Date/Time:  08/03/2014 04:00 PM  Referred by:  Care Management  Date Referred:  08/03/2014 Referred for  SNF Placement   Other Referral:   Interview type:  Patient Other interview type:   Patient wife at bedside    PSYCHOSOCIAL DATA Living Status:  FACILITY Admitted from facility:  [facility] Level of care:  Independent Living Primary support name:  Justin [name], Justin [name]  [phone] Primary support relationship to patient:  SPOUSE Degree of support available:   Strong    CURRENT CONCERNS Current Concerns  Post-Acute Placement   Other Concerns:    SOCIAL WORK ASSESSMENT / PLAN Clinical Social Worker met with patient and patient wife at bedside to offer support and discuss patient needs at discharge.  Patient states that he and his wife live independently at [facility] and have for the last two years.  Patient agreeable to ST-SNF placement at [facility] prior to returning home at discharge.  CSW to contact facility to confirm patient return once medically ready.  CSW remains available for support and to facilitate patient discharge needs once medically ready.   Assessment/plan status:  Psychosocial Support/Ongoing Assessment of Needs Other assessment/ plan:   Information/referral to community resources:   Clinical Social Worker to arrange transportation with [facility] for when patient is medically stable.  Patient and wife are only agreeable to [facility] at this time.    PATIENT'S/FAMILY'S RESPONSE TO PLAN OF CARE: Patient alert and oriented x3 sitting up in the chair. Patient states that he has good family support and feels that [facility] is able to meet his needs at discharge. Patient and wife understanding of CSW role and appreciative of support and  concern.

## 2014-08-04 NOTE — Progress Notes (Signed)
Assessment/ Plan:   1. Non oliguric- Acute on CKD: Baseline Scr ~1.2. Urine output about one liter 2. Hypokalemia- resolved 3. Complete heart block: s/p PPM placement  4. Thrombocytopenia: ?etio now resolved. Prednisone discontinued. 5. Enterococcus endocarditis: s/p MVR, Aortic root replacement, CABG, ID following 6. Volume overload  Plan: Furosemide IV ongoing for volume  Subjective: Interval History:   Objective: Vital signs in last 24 hours: Temp:  [98.2 F (36.8 C)-98.3 F (36.8 C)] 98.3 F (36.8 C) (09/01 0428) Pulse Rate:  [65-80] 80 (09/01 0428) Resp:  [17-18] 18 (09/01 0428) BP: (111-117)/(57-62) 117/57 mmHg (09/01 0428) SpO2:  [98 %-99 %] 98 % (09/01 0428) Weight:  [91.5 kg (201 lb 11.5 oz)] 91.5 kg (201 lb 11.5 oz) (09/01 0500) Weight change: 0.6 kg (1 lb 5.2 oz)  Intake/Output from previous day: 08/31 0701 - 09/01 0700 In: 340 [P.O.:240; IV Piggyback:100] Out: 500 [Urine:500] Intake/Output this shift:    General appearance: alert and cooperative Back: presacral edema 2-3+ Cardio: regular rate and rhythm, S1, S2 normal, no murmur, click, rub or gallop Extremities: edema 3+ on right, 2-3+ on left  Lab Results:  Recent Labs  08/03/14 0810 08/04/14 0532  WBC 12.1* 11.9*  HGB 9.8* 9.5*  HCT 30.9* 29.8*  PLT 195 213   BMET:  Recent Labs  08/03/14 0420 08/04/14 0532  NA 139 138  K 4.5 4.9  CL 100 99  CO2 25 25  GLUCOSE 112* 104*  BUN 66* 67*  CREATININE 3.26* 3.34*  CALCIUM 8.2* 8.1*   No results found for this basename: PTH,  in the last 72 hours Iron Studies: No results found for this basename: IRON, TIBC, TRANSFERRIN, FERRITIN,  in the last 72 hours Studies/Results: Dg Chest 2 View  08/03/2014   CLINICAL DATA:  CHF follow-up, pacemaker inserted 3 days ago, open heart surgery 2 weeks ago.  EXAM: CHEST  2 VIEW  COMPARISON:  08/02/2014  FINDINGS: Status post median sternotomy and CABG. Left atrial appendage clip and valve replacement. Right  chest wall battery pack with lead tips projecting over the right atrium and right ventricle. Small right pleural effusion with associated airspace opacity. There are small biapical pneumothoraces. Aortic atherosclerosis and mild tortuosity. Heart size upper normal to mildly enlarged. Multilevel degenerative changes  IMPRESSION: Postoperative changes as above.  Tiny biapical pneumothoraces.  Small right pleural effusion with associated airspace opacity; atelectasis versus pneumonia, increased in the interval.   Electronically Signed   By: Carlos Levering M.D.   On: 08/03/2014 06:57   Dg Chest Port 1 View  08/02/2014   CLINICAL DATA:  Status post chest tube removal  EXAM: PORTABLE CHEST - 1 VIEW  COMPARISON:  07/09/2014  FINDINGS: Cardiac shadow is stable. Postsurgical changes are again seen. A pacing device is again noted. A left-sided PICC line is seen in the proximal superior vena cava. The left chest tube has been removed. A tiny residual pneumothorax is noted. Patchy airspace disease in the left lung base is again seen and stable.  IMPRESSION: Tiny residual pneumothorax following chest tube removal.  Stable left basilar airspace disease.   Electronically Signed   By: Inez Catalina M.D.   On: 08/02/2014 11:31   Scheduled: . amiodarone  200 mg Oral BID  . ampicillin (OMNIPEN) IV  2 g Intravenous 3 times per day  . antiseptic oral rinse  7 mL Mouth Rinse QID  . aspirin EC  81 mg Oral Daily  . atorvastatin  10 mg Oral QPM  .  cefTRIAXone (ROCEPHIN)  IV  2 g Intravenous Q12H  . chlorhexidine  15 mL Mouth Rinse BID  . enoxaparin (LOVENOX) injection  30 mg Subcutaneous Q24H  . feeding supplement (ENSURE COMPLETE)  237 mL Oral BID BM  . feeding supplement (PRO-STAT SUGAR FREE 64)  30 mL Oral BID WC  . folic acid  2 mg Oral Daily  . pantoprazole  40 mg Oral Daily  . potassium chloride  20 mEq Oral TID  . pyridOXINE  100 mg Oral QPM  . sodium chloride  10-40 mL Intracatheter Q12H  . sodium chloride  3  mL Intravenous Q12H  . tamsulosin  0.4 mg Oral QHS  . vitamin B-12  1,000 mcg Oral QPM  . vitamin E  400 Units Oral Daily  . warfarin  2.5 mg Oral q1800  . Warfarin - Physician Dosing Inpatient   Does not apply q1800     LOS: 29 days   Khadim Lundberg C 08/04/2014,8:39 AM

## 2014-08-04 NOTE — Progress Notes (Signed)
Patient ambulated with RN and RW for 300 feet. Patient tolerated well and only required 2 breaks/stops. Patient returned to chair with feet elevated. Incision on right leg painted and covered with 4x4 gauze due to drainage. Alfredo Bach RN BSN 08/04/2014 3:44 PM

## 2014-08-04 NOTE — Progress Notes (Signed)
NUTRITION FOLLOW UP  DOCUMENTATION CODES Per approved criteria  -Not Applicable   INTERVENTION:  D/C Prostat liquid protein Continue Ensure Complete po BID, each supplement provides 350 kcal and 13 grams of protein RD to follow for nutrition care plan  NUTRITION DIAGNOSIS: Increased nutrient needs related to post-op healing as evidenced by estimated nutrition needs, ongoing  Goal: Pt to meet >/= 90% of their estimated nutrition needs  Monitor:  PO & supplemental intake, weight, labs, I/O's  ASSESSMENT: 80 y.o. Male with history of CAD status post stents, HTN, dyslipidemia, atrial fibrillation no longer on anticoagulation secondary to recent GI bleed, pacemaker, ITP currently being treated with IVIG by Dr. Marin Olp (most recently 8/3) and prednisone 80mg  daily, who presented to the emergency department with complaints of fever and shortness of breath that started 8/2. In ED found to have leukocytosis, hypotension, and lactic acidosis.    Patient s/p procedures 8/21: AORTIC ROOT REPLACEMENT MITRAL VALVE REPLACEMENT CORONARY BYPASS GRAFTING MAZE PROCEDURE  Pt extubated post-op 8/22.  S/p pacemaker implantation 8/28.  Pt currently on a Heart Healthy diet.  PO intake variable at 50-75% per flowsheet records.  Receiving Ensure Complete -- drinking some.  Refusing his Prostat liquid protein.  Will discontinue.  Height: Ht Readings from Last 1 Encounters:  07/08/14 5\' 11"  (1.803 m)    Weight: Wt Readings from Last 1 Encounters:  08/04/14 201 lb 11.5 oz (91.5 kg)    BMI:  Body mass index is 28.15 kg/(m^2).  Estimated Nutritional Needs: Kcal: 1900-2100 Protein: 100-110 gm Fluid: per MD  Skin: chest surgical incision   Diet Order: Heart Healthy/Carbohydrate Modified    Intake/Output Summary (Last 24 hours) at 08/04/14 1452 Last data filed at 08/04/14 1426  Gross per 24 hour  Intake    560 ml  Output    800 ml  Net   -240 ml    Labs:   Recent Labs Lab  07/29/14 0414  08/02/14 0405 08/03/14 0420 08/04/14 0532  NA 140  < > 139 139 138  K 3.1*  < > 3.9 4.5 4.9  CL 98  < > 98 100 99  CO2 23  < > 26 25 25   BUN 67*  < > 67* 66* 67*  CREATININE 3.90*  < > 3.31* 3.26* 3.34*  CALCIUM 8.0*  < > 8.0* 8.2* 8.1*  PHOS 7.3*  --   --   --  3.4  GLUCOSE 89  < > 103* 112* 104*  < > = values in this interval not displayed.  CBG (last 3)  No results found for this basename: GLUCAP,  in the last 72 hours  Scheduled Meds: . amiodarone  200 mg Oral BID  . ampicillin (OMNIPEN) IV  2 g Intravenous 3 times per day  . antiseptic oral rinse  7 mL Mouth Rinse QID  . aspirin EC  81 mg Oral Daily  . atorvastatin  10 mg Oral QPM  . cefTRIAXone (ROCEPHIN)  IV  2 g Intravenous Q12H  . chlorhexidine  15 mL Mouth Rinse BID  . enoxaparin (LOVENOX) injection  30 mg Subcutaneous Q24H  . feeding supplement (ENSURE COMPLETE)  237 mL Oral BID BM  . feeding supplement (PRO-STAT SUGAR FREE 64)  30 mL Oral BID WC  . folic acid  2 mg Oral Daily  . furosemide  80 mg Intravenous TID  . pantoprazole  40 mg Oral Daily  . pyridOXINE  100 mg Oral QPM  . sodium chloride  10-40 mL Intracatheter  Q12H  . tamsulosin  0.4 mg Oral QHS  . vitamin B-12  1,000 mcg Oral QPM  . vitamin E  400 Units Oral Daily  . warfarin  2.5 mg Oral q1800  . Warfarin - Physician Dosing Inpatient   Does not apply q1800    Continuous Infusions: . sodium chloride 10 mL/hr at 07/17/2014 0000    Past Medical History  Diagnosis Date  . CAD (coronary artery disease) prior stenting 1999   . Dyslipidemia   . HTN (hypertension)   . WPW (Wolff-Parkinson-White syndrome) loss of preexcitation   . Atrial fibrillation   . AV block, Mobitz II   . Presyncope   . Myocardial infarction   . Pacemaker 04/07/2013  . Arthritis   . History of chicken pox   . Anemia   . Hypernatremia 06/03/2013  . Thrombocytopenia, unspecified 06/03/2013  . Leukopenia 06/03/2013  . Obstructive sleep apnea 06/03/2013    USES CPAP   . Urinary incontinence 06/03/2013  . Hyperglycemia 12/21/2013  . Pancytopenia 06/03/2013  . Iron deficiency anemia, unspecified 07/02/2014  . Malabsorption of iron 07/02/2014  . Hypotestosteronemia 07/02/2014  . ITP (idiopathic thrombocytopenic purpura) 07/02/2014    possible - although patient had bacterial endocarditis at the time of diagnosis  . Aortic insufficiency   . Chronic kidney disease   . Bacterial endocarditis      Enterococcus sepsis with aortic valve vegetation  . UTI (urinary tract infection) 07/29/2014    ENTEROBACTER CLOACAE   . S/P aortic valve and mitral valve replacement 07/07/2014    21 mm [employer]onic Freestyle porcine aortic root graft 29 mm [facility] Mitral bovine bioprosthetic mitral valve   . S/P aortic valve replacement with stentless valve 07/23/2014    21 mm Medtronic Freestyle porcine aortic root graft with reimplantation of left main and right coronary arteries  . S/P mitral valve replacement with bioprosthetic valve 07/28/2014    29 mm [facility] Mitral bovine bioprosthetic tissue valve  . S/P CABG x 1 07/25/2014    SVG to OM1 with EVH via right thigh  . S/P Maze operation for atrial fibrillation 07/25/2014    Complete bilateral atrial lesion set using cryothermy and bipolar radiofrequency ablation with clipping of LA appendage  . Acute renal failure superimposed on stage 3 chronic kidney disease 07/27/2014  . Acute on chronic diastolic heart failure 8/52/7782    Past Surgical History  Procedure Laterality Date  . Coronary angioplasty with stent placement    . Vastectomy    . Insert / replace / remove pacemaker  04/07/2013  . Tee without cardioversion N/A 07/21/2014    Procedure: TRANSESOPHAGEAL ECHOCARDIOGRAM (TEE);  Surgeon: Lelon Perla, MD;  Location: [place];  Service: Cardiovascular;  Laterality: N/A;  . Aortic valve replacement N/A 07/30/2014    Procedure: ROOT REPLACEMENT WITH BIOPROSTHETIC PORCINE AORTIC ROOT REIMPLANTATION OF LEFT MAIN AND  RIGHT CORONARY ARTERIES;  Surgeon: Rexene Alberts, MD;  Location: [place];  Service: Open Heart Surgery;  Laterality: N/A;  . Intraoperative transesophageal echocardiogram N/A 07/05/2014    Procedure: INTRAOPERATIVE TRANSESOPHAGEAL ECHOCARDIOGRAM;  Surgeon: Rexene Alberts, MD;  Location: [place];  Service: Open Heart Surgery;  Laterality: N/A;  . Maze N/A 08/03/2014    Procedure: MAZE;  Surgeon: Rexene Alberts, MD;  Location: [place];  Service: Open Heart Surgery;  Laterality: N/A;  . Coronary artery bypass graft N/A 07/05/2014    Procedure: CORONARY ARTERY BYPASS GRAFTING (CABG), on pump, times one, using right greater saphenous vein  harvested endoscopically.;  Surgeon: Rexene Alberts, MD;  Location: [place];  Service: Open Heart Surgery;  Laterality: N/A;  . Mitral valve replacement N/A 07/04/2014    Procedure: MITRAL VALVE (MV) REPLACEMENT;  Surgeon: Rexene Alberts, MD;  Location: [place];  Service: Open Heart Surgery;  Laterality: N/A;    Arthur Holms, RD, LDN Pager #: [phone] After-Hours Pager #: [phone]

## 2014-08-04 NOTE — Progress Notes (Signed)
Patient Name: Justin [name] Date of Encounter: 08/04/2014     Principal Problem:   Bacterial endocarditis - Enterococcus sepsis with aortic valve vegetation Active Problems:   CAD (coronary artery disease)   HTN (hypertension)   Atrial fibrillation   Anemia   Thrombocytopenia   Obstructive sleep apnea   Chronotropic incompetence with sinus node dysfunction   Mitral regurgitation   SOB (shortness of breath)   CHF (congestive heart failure)   Diastolic congestive heart failure   Sepsis   Enterococcal bacteremia   TIA (transient ischemic attack)   Aortic insufficiency   Chronic kidney disease   S/P aortic valve replacement with stentless valve   S/P mitral valve replacement with bioprosthetic valve   S/P CABG x 1   S/P Maze operation for atrial fibrillation   Acute renal failure superimposed on stage 3 chronic kidney disease   Acute on chronic diastolic heart failure    SUBJECTIVE  Denies any CP. No bleeding issue. Mainly slept on his back, mild SOB.   CURRENT MEDS . amiodarone  200 mg Oral BID  . ampicillin (OMNIPEN) IV  2 g Intravenous 3 times per day  . antiseptic oral rinse  7 mL Mouth Rinse QID  . aspirin EC  81 mg Oral Daily  . atorvastatin  10 mg Oral QPM  . cefTRIAXone (ROCEPHIN)  IV  2 g Intravenous Q12H  . chlorhexidine  15 mL Mouth Rinse BID  . enoxaparin (LOVENOX) injection  30 mg Subcutaneous Q24H  . feeding supplement (ENSURE COMPLETE)  237 mL Oral BID BM  . feeding supplement (PRO-STAT SUGAR FREE 64)  30 mL Oral BID WC  . folic acid  2 mg Oral Daily  . furosemide  80 mg Intravenous TID  . pantoprazole  40 mg Oral Daily  . pyridOXINE  100 mg Oral QPM  . sodium chloride  10-40 mL Intracatheter Q12H  . sodium chloride  3 mL Intravenous Q12H  . tamsulosin  0.4 mg Oral QHS  . vitamin B-12  1,000 mcg Oral QPM  . vitamin E  400 Units Oral Daily  . warfarin  2.5 mg Oral q1800  . Warfarin - Physician Dosing Inpatient   Does not apply q1800     OBJECTIVE  Filed Vitals:   08/03/14 0508 08/03/14 2113 08/04/14 0428 08/04/14 0500  BP: 109/48 111/62 117/57   Pulse: 59 65 80   Temp: 98.1 F (36.7 C) 98.2 F (36.8 C) 98.3 F (36.8 C)   TempSrc: Oral Oral Oral   Resp: 8 17 18    Height:      Weight: 200 lb 6.4 oz (90.9 kg)   201 lb 11.5 oz (91.5 kg)  SpO2: 100% 99% 98%     Intake/Output Summary (Last 24 hours) at 08/04/14 1001 Last data filed at 08/04/14 0933  Gross per 24 hour  Intake    220 ml  Output    600 ml  Net   -380 ml   Filed Weights   08/02/14 0100 08/03/14 0508 08/04/14 0500  Weight: 199 lb 11.8 oz (90.6 kg) 200 lb 6.4 oz (90.9 kg) 201 lb 11.5 oz (91.5 kg)    PHYSICAL EXAM  General: Pleasant, NAD. Neuro: Alert and oriented X 3. Moves all extremities spontaneously. Psych: Normal affect. HEENT:  Normal  Neck: Supple without bruits. + JVD Lungs:  Resp regular and unlabored, marked rales noted in L bases, no rhonchi or wheezing Heart: RRR no s3, s4, or murmurs. Pacemaker on R pectoral  site, dried blood, no active bleeding, dressing in place. PICC line in place on LUE Abdomen: Soft, non-tender, non-distended, BS + x 4.  Extremities: No clubbing, cyanosis. DP/PT/Radials 2+ and equal bilaterally. 3+ pitting edema in the lower extremities  Accessory Clinical Findings  CBC  Recent Labs  08/03/14 0810 08/04/14 0532  WBC 12.1* 11.9*  HGB 9.8* 9.5*  HCT 30.9* 29.8*  MCV 104.0* 102.1*  PLT 195 716   Basic Metabolic Panel  Recent Labs  08/03/14 0420 08/04/14 0532  NA 139 138  K 4.5 4.9  CL 100 99  CO2 25 25  GLUCOSE 112* 104*  BUN 66* 67*  CREATININE 3.26* 3.34*  CALCIUM 8.2* 8.1*  PHOS  --  3.4   Liver Function Tests  Recent Labs  08/04/14 0532  ALBUMIN 2.0*    TELE Atrial sensed ventricularly paced rhythm with HR 70s, no significant ventricular ectopy    ECG  Atrial sensed and ventricularly paced rhythm with HR 80s  Echocardiogram  Left ventricle: The cavity size was  mildly dilated. Systolic function was normal. The estimated ejection fraction was in the range of 50% to 55%. Wall motion was normal; there were no regional wall motion abnormalities. Features are consistent with a pseudonormal left ventricular filling pattern, with concomitant abnormal relaxation and increased filling pressure (grade 2 diastolic dysfunction). Doppler parameters are consistent with elevated ventricular end-diastolic filling pressure. - Aortic valve: Trileaflet; mildly thickened leaflets. There was moderate regurgitation. - Mitral valve: There was moderate to severe regurgitation. - Right ventricle: Systolic function was mildly reduced. - Tricuspid valve: There was moderate regurgitation. - Pulmonary arteries: Systolic pressure was severely increased. PA peak pressure: 65 mm Hg (S).  Impressions:  - Mildly dilated left ventricle with normal systolic function and grade 2 diastolic dysfunction. Moderate aortic insufficiency, moderate to severe mitral and moderate tricusid regurgitation. Mildly reduced RV systolic function. Severe pulmonary hypertension.       Radiology/Studies  Ct Abdomen Pelvis Wo Contrast  07/09/2014   CLINICAL DATA:  Enterococcus bacteremia. Evaluate for abdominal source. Evaluate for potential tumor. Enterobacter is also present in the urine.  EXAM: CT ABDOMEN AND PELVIS WITHOUT CONTRAST  TECHNIQUE: Multidetector CT imaging of the abdomen and pelvis was performed following the standard protocol without IV contrast.  COMPARISON:  No priors.  FINDINGS: Lung Bases: Atherosclerotic calcifications in the right coronary artery. Pacemaker lead terminating in the right ventricular apex. Moderate-sized hiatal hernia. Moderate to large bilateral pleural effusions which appear to layer dependently and are associated with extensive passive atelectasis in the visualized portions of the lower lobes of the lungs bilaterally.  Abdomen/Pelvis: 9 mm low attenuation  lesion in segment 2 of the liver is incompletely characterized on today's noncontrast CT examination, but is unchanged, and favored to represent a tiny cyst. No other focal hepatic lesions are identified on today's non contrast CT examination. There is a tiny calcifications in the head of the pancreas (image 31 of series 2) which appears to be immediately posterior to the distal common bile duct, potentially related to prior episodes of pancreatitis. Other small calcifications are also noted in the head and tail of the pancreas as well. The calcification in the head of the pancreas is not favored to be within the common bile duct, as the common bile duct is normal in caliber measuring 4 mm, and there is no intrahepatic biliary ductal dilatation to suggest obstruction. The unenhanced appearance of the gallbladder, spleen and bilateral adrenal glands is unremarkable. There is bilateral  perinephric stranding which is nonspecific. 1.5 cm intermediate attenuation lesion extending exophytically off the lateral aspect of the upper pole of the right kidney is incompletely characterized on today's non contrast CT examination, but is statistically likely to represent a mildly proteinaceous cyst.  Normal appendix. Trace volume of ascites. No pneumoperitoneum. No pathologic distention of small bowel. 10 mm short axis inguinal lymph nodes bilaterally are nonspecific. No other definite lymphadenopathy identified in the abdomen or pelvis on today's non contrast CT examination. Mild diffuse mesenteric edema. Prostate gland is enlarged measuring 4.9 x 6.1 cm. Urinary bladder is generally unremarkable in appearance, however, the attenuation values in the urine are rather high ranging from 45-55 HU, suggesting proteinaceous or hemorrhagic contents.  Musculoskeletal: Mild diffuse body wall edema. There are no aggressive appearing lytic or blastic lesions noted in the visualized portions of the skeleton.  IMPRESSION: 1. No definite  source for bacteremia identified on today's examination. 2. Relatively high attenuation of the urine is of uncertain etiology and significance, but may suggest some proteinaceous or hemorrhagic contents. 3. Findings suggestive of anasarca, including moderate to large bilateral pleural effusions, trace volume of ascites, mild diffuse mesenteric edema and diffuse body wall edema. 4. Prostatomegaly. 5. Additional incidental findings, as above.   Electronically Signed   By: Vinnie Langton M.D.   On: 07/09/2014 12:46   Dg Orthopantogram  07/15/2014   CLINICAL DATA:  Endocarditis.  EXAM: ORTHOPANTOGRAM/PANORAMIC  COMPARISON:  None.  FINDINGS: There are multiple missing teeth and numerous metallic restorations. However, there is no evidence of a periapical abscess or osteomyelitis or other acute abnormality.  IMPRESSION: No periapical abscesses or other acute abnormalities.   Electronically Signed   By: Rozetta Nunnery M.D.   On: 07/15/2014 21:43   Dg Chest 2 View  08/03/2014   CLINICAL DATA:  CHF follow-up, pacemaker inserted 3 days ago, open heart surgery 2 weeks ago.  EXAM: CHEST  2 VIEW  COMPARISON:  08/02/2014  FINDINGS: Status post median sternotomy and CABG. Left atrial appendage clip and valve replacement. Right chest wall battery pack with lead tips projecting over the right atrium and right ventricle. Small right pleural effusion with associated airspace opacity. There are small biapical pneumothoraces. Aortic atherosclerosis and mild tortuosity. Heart size upper normal to mildly enlarged. Multilevel degenerative changes  IMPRESSION: Postoperative changes as above.  Tiny biapical pneumothoraces.  Small right pleural effusion with associated airspace opacity; atelectasis versus pneumonia, increased in the interval.   Electronically Signed   By: Carlos Levering M.D.   On: 08/03/2014 06:57   Dg Chest 2 View  07/21/2014   CLINICAL DATA:  Pacemaker removal  EXAM: CHEST  2 VIEW  COMPARISON:  07/16/2014   FINDINGS: Cardiomediastinal silhouette is stable. No acute infiltrate or pneumothorax. No pulmonary edema. Right arm PICC line is unchanged in position. Trace left pleural effusion with left basilar atelectasis.  IMPRESSION: No pneumothorax. No acute infiltrate or pulmonary edema. Trace left pleural effusion with left basilar atelectasis   Electronically Signed   By: Lahoma Crocker M.D.   On: 07/21/2014 08:10   Dg Chest 2 View  07/15/2014   CLINICAL DATA:  Pacemaker extraction  EXAM: CHEST  2 VIEW  COMPARISON:  07/07/2014  FINDINGS: Cardiac pacer has been removed with generator and 2 leads absent. Mild interstitial change in peribronchial cuffing. These findings are less prominent when compared to the prior study. There is consolidation in the medial left lung base which appears most consistent with atelectasis.  IMPRESSION: 1.  Mild residual interstitial change may reflect minimal remaining interstitial pulmonary edema 2. Left lower lobe atelectasis   Electronically Signed   By: Skipper Cliche M.D.   On: 07/15/2014 08:15   Dg Chest 2 View  07/25/2014   CLINICAL DATA:  Fever, chills, shortness of breath.  EXAM: CHEST  2 VIEW  COMPARISON:  06/10/2014  FINDINGS: Left pacer in place with leads in the right atrium and right ventricle. Heart is normal size. Mild peribronchial thickening. No confluent airspace opacities or effusions. No acute bony abnormality.  IMPRESSION: Bronchitic changes.   Electronically Signed   By: Rolm Baptise M.D.   On: 07/05/2014 15:07   Ct Head Wo Contrast  07/09/2014   CLINICAL DATA:  Hallucinations.  Thrombocytopenia  EXAM: CT HEAD WITHOUT CONTRAST  TECHNIQUE: Contiguous axial images were obtained from the base of the skull through the vertex without intravenous contrast.  COMPARISON:  None.  FINDINGS: Mild atrophy. Negative for hemorrhage. Negative for acute infarct or mass. Calvarium intact.  IMPRESSION: No acute abnormality.   Electronically Signed   By: Franchot Gallo M.D.   On:  07/09/2014 18:47   Ct Angio Chest W/cm &/or Wo Cm  07/21/2014   CLINICAL DATA:  Shortness of breath and fever  EXAM: CT ANGIOGRAPHY CHEST WITH CONTRAST  TECHNIQUE: Multidetector CT imaging of the chest was performed using the standard protocol during bolus administration of intravenous contrast. Multiplanar CT image reconstructions and MIPs were obtained to evaluate the vascular anatomy.  CONTRAST:  54mL OMNIPAQUE IOHEXOL 350 MG/ML SOLN  COMPARISON:  Chest radiograph July 06, 2014  FINDINGS: There is no demonstrable pulmonary embolus. There is no thoracic aortic aneurysm or dissection.  There are free-flowing pleural effusions bilaterally. There is hazy ground-glass opacity diffusely.  On axial CT slice 55 series 6, there is a 6 x 4 mm nodular opacity in the medial segment of the right middle lobe. On axial slice 64 series 6, there is a 6 x 4 mm nodular opacity in the lateral segment of the left lower lobe. On axial slice 62 series 6, there is a 7 x 5 mm nodular opacity in the lateral segment of the right middle lobe. On axial slice 69 series 6, there is a 5 x 4 mm nodular opacity in the superior segment of the right lower lobe.  There is no appreciable thoracic adenopathy. Pericardium is not thickened. Pacemaker leads are attached to the right atrium and right ventricle. There is a focal hiatal hernia.  Visualized upper abdominal structures appear unremarkable. There are no blastic or lytic bone lesions. There is degenerative change in the thoracic spine. Thyroid appears unremarkable.  Review of the MIP images confirms the above findings.  IMPRESSION: No demonstrable pulmonary embolus.  Bilateral effusions with extensive ground-glass opacity bilaterally. Suspect alveolar edema with findings consistent with congestive heart failure. Atypical infection or allergic type phenomenon are differential considerations, however.  Several nodular opacities, largest measuring 7 mm. Followup of these nodular opacity should  be based on Fleischner Society guidelines. If the patient is at high risk for bronchogenic carcinoma, follow-up chest CT at 3-50months is recommended. If the patient is at low risk for bronchogenic carcinoma, follow-up chest CT at 6-12 months is recommended. This recommendation follows the consensus statement: Guidelines for Management of Small Pulmonary Nodules Detected on CT Scans: A Statement from the [employer] as published in Radiology 2005; 237:395-400.  Focal hiatal hernia.  No adenopathy.   Electronically Signed   By: Lowella Grip M.D.  On: 07/25/2014 18:09   US Renal  07/28/2014   CLINICAL DATA:  Acute renal failure.  EXAM: RENAL/URINARY TRACT ULTRASOUND COMPLETE  COMPARISON:  CT dated 07/09/2014.  FINDINGS: Right Kidney:  Length: 11.2 cm. 1.7 x 1.4 x 1.3 cm exophytic hypoechoic mass arising from the mid to upper right kidney, laterally. This measured 20 Hounsfield units on the recent noncontrast CT examination. Otherwise, normal appearing right kidney without hydronephrosis.  Left Kidney:  Length: 10.5 cm. Echogenicity within normal limits. No mass or hydronephrosis visualized.  Bladder:  Nondistended, with a Foley catheter in place.  A small amount of free peritoneal fluid is noted.  IMPRESSION: 1. No hydronephrosis. 2. 1.7 cm hypoechoic mass rising from the mid upper right kidney, laterally. Again, this most likely represents a proteinaceous cyst. A solid mass is less likely. 3. Small amount of ascites.   Electronically Signed   By: Enrique Sack M.D.   On: 07/28/2014 00:47   Dg Chest Port 1 View  08/02/2014   CLINICAL DATA:  Status post chest tube removal  EXAM: PORTABLE CHEST - 1 VIEW  COMPARISON:  07/14/2014  FINDINGS: Cardiac shadow is stable. Postsurgical changes are again seen. A pacing device is again noted. A left-sided PICC line is seen in the proximal superior vena cava. The left chest tube has been removed. A tiny residual pneumothorax is noted. Patchy airspace disease in the  left lung base is again seen and stable.  IMPRESSION: Tiny residual pneumothorax following chest tube removal.  Stable left basilar airspace disease.   Electronically Signed   By: Inez Catalina M.D.   On: 08/02/2014 11:31   Dg Chest Portable 1 View  07/13/2014   CLINICAL DATA:  Post implant.  Evaluate for pneumothorax.  EXAM: PORTABLE CHEST - 1 VIEW  COMPARISON:  07/12/2014  FINDINGS: Right pacer has in place with leads in the right atrium and right ventricle. Left chest tube and left PICC line remain in place, unchanged. No pneumothorax. Patchy airspace disease in the left lung again noted, stable. Minimal right base atelectasis.  IMPRESSION: Right pacer placement.  No pneumothorax.  Otherwise no change.   Electronically Signed   By: Rolm Baptise M.D.   On: 07/09/2014 21:35   Dg Chest Port 1 View  07/26/2014   CLINICAL DATA:  New left PICC  EXAM: PORTABLE CHEST - 1 VIEW  COMPARISON:  07/30/2014  FINDINGS: Left PICC has its tip in the lower superior vena cava, well positioned. The tip lies adjacent to the indwelling right PICC, which is unchanged from the prior study.  Changes from cardiac surgery are stable.  Left and right chest tubes are stable.  No pneumothorax.  Mild irregular interstitial thickening with patchy hazy airspace opacity is noted in the left mid lung, without change. Right mid lung zone patchy airspace opacity has improved, likely improved atelectasis.  IMPRESSION: 1. New left PICC is well positioned. 2. Improved small area patchy airspace opacity in the right mid lung, likely improved atelectasis. 3. No other change.  No pneumothorax.   Electronically Signed   By: Lajean Manes M.D.   On: 08/02/2014 10:22   Dg Chest Port 1 View  07/30/2014   CLINICAL DATA:  follow-up CHF  EXAM: PORTABLE CHEST - 1 VIEW  COMPARISON:  07/28/2014  FINDINGS: Previous CABG, valve replacement surgery, and left apical clip placement. Bilateral chest tubes in place with no pneumothorax. Right PICC line stable.  Right IJ central line has been removed. Mediastinal drain has been removed.  Patchy airspace opacities in mid lung zones left greater than right persist. No definite effusion although the left lateral costophrenic angle is obscured by overlying monitoring hardware. Heart size remains normal. Atheromatous aorta.  IMPRESSION: 1. Removal of right IJ central line and mediastinal drain. Otherwise stable appearance since previous exam.   Electronically Signed   By: Arne Cleveland M.D.   On: 07/30/2014 07:53   Dg Chest Port 1 View  07/28/2014   CLINICAL DATA:  Aortic root replacement and CABG  EXAM: PORTABLE CHEST - 1 VIEW  COMPARISON:  Prior chest x-ray 07/27/2014  FINDINGS: Stable position of right IJ vascular sheath and right upper extremity PICC. Catheter tips are within the SVC. Mediastinal and bilateral chest tubes are also unchanged. Patient is status post median sternotomy with evidence of aortic valve replacement, ligation of the left atrial appendage and multivessel CABG. Stable cardiac and mediastinal contours. Atherosclerotic calcification in the transverse aorta. Similar to incrementally increased patchy opacity in the periphery of the left mid lung. No pneumothorax, significant pleural effusion or evidence of edema.  IMPRESSION: 1. Stable and satisfactory support apparatus. 2. Similar to incrementally increased patchy opacity in the periphery of the left mid lung. Differential considerations include shifting atelectasis, focal alveolar hemorrhage, and potentially developing infiltrate. 3. Otherwise, unchanged appearance of the chest.   Electronically Signed   By: Jacqulynn Cadet M.D.   On: 07/28/2014 08:04   Dg Chest Port 1 View  07/27/2014   CLINICAL DATA:  Status post heart surgery.  EXAM: PORTABLE CHEST - 1 VIEW  COMPARISON:  07/26/2014  FINDINGS: The Swan-Ganz catheter has been removed. The right IJ Cordis is still in place. The right PICC line is unchanged. Stable bilateral chest tubes without  pneumothorax. Persistent but slightly improved areas bilateral atelectasis. No definite pleural effusions.  IMPRESSION: Stable support apparatus.  No pneumothorax.  Slight improved lung aeration with resolving areas of atelectasis.   Electronically Signed   By: Kalman Jewels M.D.   On: 07/27/2014 07:42   Dg Chest Port 1 View  07/26/2014   CLINICAL DATA:  Status post cardiac procedures  EXAM: PORTABLE CHEST - 1 VIEW  COMPARISON:  Portable chest x-ray of July 25, 2014  FINDINGS: The right lung is adequately inflated. There is no pneumothorax or pleural effusion. The interstitial markings have improved. The chest tube tip on the right is unchanged in the lateral costophrenic gutter.  The left lung is well-expanded. The interstitial markings remain increased but have improved. There is no pneumothorax. The left upper chest tube is unchanged in position. A chest tube at the level of the left hemidiaphragm also appears stable. An additional tube to the left of the thoracic spine is demonstrated with its tip at the level of the AP window.  The cardiac silhouette is top-normal in size. A left atrial appendage clip is present. A mitral valve ring is demonstrated. There is a Swan-Ganz catheter whose tip lies in the proximal right descending pulmonary artery. There are coronary the artery bypass rings present.  IMPRESSION: There has been mild interval improvement in the appearance of the pulmonary interstitium especially on the right. The support tubes and lines are in reasonable position.   Electronically Signed   By: David  Martinique   On: 07/26/2014 07:50   Dg Chest Portable 1 View In Am  07/25/2014   CLINICAL DATA:  Postop CABG.  EXAM: PORTABLE CHEST - 1 VIEW  COMPARISON:  07/23/2014 and 07/21/2014.  FINDINGS: 0650 hr. Mild patient rotation  to the right. Endotracheal tube tip remains in the midtrachea. The Swan-Ganz catheter has been partially withdrawn into the right main pulmonary artery. Right arm PICC,  nasogastric tube and chest tubes remain in place. The heart size is stable status post median sternotomy and valve replacement. There is mild residual asymmetric airspace disease in the left lung. No pneumothorax or significant pleural effusion is identified.  IMPRESSION: 1. Partial withdrawal of the Swan-Ganz catheter. The support system is otherwise unchanged. 2. Persistent asymmetric left perihilar airspace disease, likely atelectasis or asymmetric edema. No pneumothorax.   Electronically Signed   By: Camie Patience M.D.   On: 07/25/2014 09:05   Dg Chest Portable 1 View  07/22/2014   CLINICAL DATA:  Postoperative  EXAM: PORTABLE CHEST - 1 VIEW  COMPARISON:  PA and lateral chest of July 21, 2014  FINDINGS: The lungs are well-expanded. The interstitial markings are increased diffusely on the left. There is left lower lobe atelectasis. The pulmonary vascularity is indistinct. The cardiac silhouette is normal in size. There is a left atrial appendage clip present. A mitral valve ring is present.  The endotracheal tube tip lies 4.4 cm above the crotch of the carina. The Swan-Ganz type catheter tip is in a right descending pulmonary artery and may be wedged. The esophagogastric tube tip projects below the inferior margin of the image. The left upper chest tube tip overlies the posterior aspect of the third rib. Two mediastinal drains overlye the mid thoracic spine. A lower chest tube is present lying just above the posterior costophrenic gutter on the left. There is a chest tube in place on the right with the tip in the medial costophrenic gutter. There is a PICC line in place whose tip lies in the midportion of the SVC.  IMPRESSION: There is pulmonary interstitial edema, predominantly left-sided. There is left lower lobe atelectasis. There is no significant pleural effusion and no pneumothorax. The support tubes and lines are in appropriate position with exception of the Swan-Ganz catheter whose tip may be wedged.  Withdrawal by 5-6 cm is needed.  These results were called by telephone at the time of interpretation on 07/07/2014 at 8:47 pm to Charleen Kirks verbally acknowledged these results.   Electronically Signed   By: David  Martinique   On: 07/23/2014 20:50   Dg Chest Port 1 View  07/16/2014   CLINICAL DATA:  Line placement  EXAM: PORTABLE CHEST - 1 VIEW  COMPARISON:  None.  FINDINGS: Right-sided PICC line with the tip projecting over the SVC.  Small left pleural effusion. Trace right pleural effusion with right basilar airspace disease which may reflect atelectasis versus pneumonia. No pneumothorax.  Stable cardiomediastinal silhouette. Thoracic aortic atherosclerosis. Unremarkable osseous structures.  IMPRESSION: 1. Right-sided PICC line with the tip projecting over the SVC. 2. Bilateral small pleural effusions. Right basilar airspace disease which may reflect atelectasis versus pneumonia.   Electronically Signed   By: Kathreen Devoid   On: 07/16/2014 16:00   Dg Chest Port 1 View  07/07/2014   CLINICAL DATA:  Evaluate airspace.  EXAM: PORTABLE CHEST - 1 VIEW  COMPARISON:  CT 07/22/2014.  Chest x-ray 07/27/2014 and 06/10/2014.  FINDINGS: Cardiac pacer with lead tips in right atrium and right ventricle. Mediastinum and hilar structures are unremarkable. Cardiomegaly with mild pulmonary vascular prominence and interstitial prominence consistent with congestive heart failure. Small pleural effusions. No pneumothorax. Reference is made to recent chest CT which demonstrated small pulmonary nodules. These are not definitely identified by chest  x-ray. No acute bony abnormality.  IMPRESSION: 1. Persistent changes of congestive heart failure pulmonary edema. Small pleural effusions. 2. Reference is made to recent chest CT which demonstrated small pulmonary nodules. Follow-up is recommended as on prior CT report.   Electronically Signed   By: Marcello Moores  Register   On: 07/07/2014 07:26    ASSESSMENT AND PLAN  1. status post  aortic root replacement, aortic valve replacement, mitral valve replacement, coronary artery bypassing graft and maze  - recovering well, however fluid overload as he's off lasix for past 2 days  - restarting IV lasix today 80mg  TID by nephrology  - doing well from cardiac perspective  - cardiac rehab on board, possibly discharge this week if able to get fluid off and renal function improve   2. Acute on chronic renal insufficiency: nephrology managing 3. Thrombocytopenia - resolved, hematology signed off 4. PAF: on amiodarone and coumadin 5. CAD: on ASA and statin 6. 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 Repeat echo ordered. Following Kirk Ruths

## 2014-08-04 NOTE — Progress Notes (Addendum)
      [address]       [place] [address]             [phone]        4 Days Post-Op Procedure(s) (LRB): PERMANENT PACEMAKER INSERTION (N/A)  Subjective: Patient sitting in chair. He has no specific complaints.  Objective: Vital signs in last 24 hours: Temp:  [98.2 F (36.8 C)-98.3 F (36.8 C)] 98.3 F (36.8 C) (09/01 0428) Pulse Rate:  [65-80] 80 (09/01 0428) Cardiac Rhythm:  [-] A-V Sequential paced (08/31 1940) Resp:  [17-18] 18 (09/01 0428) BP: (111-117)/(57-62) 117/57 mmHg (09/01 0428) SpO2:  [98 %-99 %] 98 % (09/01 0428) Weight:  [201 lb 11.5 oz (91.5 kg)] 201 lb 11.5 oz (91.5 kg) (09/01 0500)  Pre op weight 86 kg Current Weight  08/04/14 201 lb 11.5 oz (91.5 kg)      Intake/Output from previous day: 08/31 0701 - 09/01 0700 In: 340 [P.O.:240; IV Piggyback:100] Out: 500 [Urine:500]   Physical Exam:  Cardiovascular: RRR, paced Pulmonary: Diminished at bases; no rales, wheezes, or rhonchi. Abdomen: Soft, non tender, bowel sounds present. Extremities: Has 2-3+ pitting  lower extremity edema. Wounds: Clean and dry.  No erythema or signs of infection. RUE wound dressing is clean and dry  Lab Results: CBC: Recent Labs  08/03/14 0810 08/04/14 0532  WBC 12.1* 11.9*  HGB 9.8* 9.5*  HCT 30.9* 29.8*  PLT 195 213   BMET:  Recent Labs  08/03/14 0420 08/04/14 0532  NA 139 138  K 4.5 4.9  CL 100 99  CO2 25 25  GLUCOSE 112* 104*  BUN 66* 67*  CREATININE 3.26* 3.34*  CALCIUM 8.2* 8.1*    PT/INR:  Lab Results  Component Value Date   INR 1.46 08/04/2014   INR 1.23 08/03/2014   INR 1.22 08/02/2014   ABG:  INR: Will add last result for INR, ABG once components are confirmed Will add last 4 CBG results once components are confirmed  Assessment/Plan:  1. CV -PAF. Previus CHB. S/P PPM.Paced. On Amiodarone 200 bid and Coumadin 2.5 daily. INR increased from 1.23 to 1.46. 2.  Pulmonary - Encourage incentive spirometer 3. Volume  Overload - not on diuretics at this time 4.  Acute blood loss anemia - H and H stable at 9.5 and 29.8 5. ID-enterococcal endocarditis. On Ampicillin and Ceftriaxone. Continue until 08/17/2014. 6. CKD (stage III)-creatinine slightly increased from 3.26 to 3.34. Per nephrology 7. Potassium up to 4.9. On potassium three times daily. Will stop for now and monitor 8. Will go to [facility] when ready for SNF  ZIMMERMAN,DONIELLE MPA-C 08/04/2014,8:05 AM  I have seen and examined the patient and agree with the assessment and plan as outlined.  I/O's essentially balanced last 3-4 days, weight trending up now approx 5 kg above pre-op weight.  Lower extremity edema worse.  Lasix restarted.  Will get f/u ECHO to assess RV function.  [name],[name] 08/04/2014 10:39 AM

## 2014-08-04 NOTE — Progress Notes (Signed)
CARDIAC REHAB PHASE I   PRE:  Rate/Rhythm: pacing 91  BP:  Supine:   Sitting: 108/70  Standing:    SaO2: 95%RA  MODE:  Ambulation: 350 ft   POST:  Rate/Rhythm: 96 pacing  BP:  Supine:   Sitting: 130/80  Standing:    SaO2: 95%RA 1036-1114 Pt walked 350 ft on RA with gait belt use,rolling walker and asst x 1 with fairly steady gait. Pt stopped a couple of times to catch his breath. Encouraged pursed lip breathing. Some DOE noted. To recliner after walk. Daughter in room. Sats good on RA.   Graylon Good, RN BSN  08/04/2014 11:14 AM

## 2014-08-04 DEATH — deceased

## 2014-08-05 DIAGNOSIS — R031 Nonspecific low blood-pressure reading: Secondary | ICD-10-CM

## 2014-08-05 DIAGNOSIS — I369 Nonrheumatic tricuspid valve disorder, unspecified: Secondary | ICD-10-CM

## 2014-08-05 LAB — COMPREHENSIVE METABOLIC PANEL
ALK PHOS: 179 U/L — AB (ref 39–117)
ALT: 27 U/L (ref 0–53)
ANION GAP: 13 (ref 5–15)
AST: 76 U/L — ABNORMAL HIGH (ref 0–37)
Albumin: 2 g/dL — ABNORMAL LOW (ref 3.5–5.2)
BUN: 66 mg/dL — AB (ref 6–23)
CALCIUM: 8.2 mg/dL — AB (ref 8.4–10.5)
CO2: 25 meq/L (ref 19–32)
Chloride: 97 mEq/L (ref 96–112)
Creatinine, Ser: 3.36 mg/dL — ABNORMAL HIGH (ref 0.50–1.35)
GFR, EST AFRICAN AMERICAN: 19 mL/min — AB (ref >90)
GFR, EST NON AFRICAN AMERICAN: 16 mL/min — AB (ref >90)
GLUCOSE: 102 mg/dL — AB (ref 70–99)
Potassium: 4.6 mEq/L (ref 3.7–5.3)
SODIUM: 135 meq/L — AB (ref 137–147)
Total Bilirubin: 0.3 mg/dL (ref 0.3–1.2)
Total Protein: 5.8 g/dL — ABNORMAL LOW (ref 6.0–8.3)

## 2014-08-05 LAB — CBC
HCT: 29.6 % — ABNORMAL LOW (ref 39.0–52.0)
HEMOGLOBIN: 9.4 g/dL — AB (ref 13.0–17.0)
MCH: 33.3 pg (ref 26.0–34.0)
MCHC: 31.8 g/dL (ref 30.0–36.0)
MCV: 105 fL — ABNORMAL HIGH (ref 78.0–100.0)
Platelets: 162 10*3/uL (ref 150–400)
RBC: 2.82 MIL/uL — AB (ref 4.22–5.81)
RDW: 22.9 % — ABNORMAL HIGH (ref 11.5–15.5)
WBC: 11.4 10*3/uL — ABNORMAL HIGH (ref 4.0–10.5)

## 2014-08-05 LAB — PROTIME-INR
INR: 1.43 (ref 0.00–1.49)
PROTHROMBIN TIME: 17.5 s — AB (ref 11.6–15.2)

## 2014-08-05 LAB — PRO B NATRIURETIC PEPTIDE: PRO B NATRI PEPTIDE: 8213 pg/mL — AB (ref 0–450)

## 2014-08-05 LAB — PREALBUMIN: Prealbumin: 13.2 mg/dL — ABNORMAL LOW (ref 17.0–34.0)

## 2014-08-05 MED ORDER — FUROSEMIDE 10 MG/ML IJ SOLN
120.0000 mg | Freq: Four times a day (QID) | INTRAVENOUS | Status: DC
  Administered 2014-08-05 – 2014-08-07 (×8): 120 mg via INTRAVENOUS
  Filled 2014-08-05 (×12): qty 12

## 2014-08-05 MED ORDER — WARFARIN SODIUM 4 MG PO TABS
4.0000 mg | ORAL_TABLET | Freq: Every day | ORAL | Status: DC
  Administered 2014-08-05 – 2014-08-08 (×4): 4 mg via ORAL
  Filled 2014-08-05 (×6): qty 1

## 2014-08-05 MED ORDER — DARBEPOETIN ALFA-POLYSORBATE 150 MCG/0.3ML IJ SOLN
150.0000 ug | Freq: Once | INTRAMUSCULAR | Status: AC
  Administered 2014-08-05: 150 ug via SUBCUTANEOUS
  Filled 2014-08-05: qty 0.3

## 2014-08-05 MED ORDER — DIPHENHYDRAMINE HCL 25 MG PO CAPS: 25.0000 mg | ORAL_CAPSULE | Freq: Every evening | ORAL | Status: DC | PRN

## 2014-08-05 MED ORDER — ZOLPIDEM TARTRATE 5 MG PO TABS
5.0000 mg | ORAL_TABLET | Freq: Every evening | ORAL | Status: DC | PRN
  Administered 2014-08-05 – 2014-08-09 (×2): 5 mg via ORAL
  Filled 2014-08-05 (×2): qty 1

## 2014-08-05 NOTE — Progress Notes (Addendum)
      [address]       [place] [address]             [phone]      5 Days Post-Op Procedure(s) (LRB): PERMANENT PACEMAKER INSERTION (N/A)  Subjective:  [name] is doing well.  He continues to complain to lower extremity edema.  He is ambulating with assistance.  + BM  Objective: Vital signs in last 24 hours: Temp:  [97.5 F (36.4 C)-98.2 F (36.8 C)] 97.5 F (36.4 C) (09/02 0622) Pulse Rate:  [87-95] 87 (09/02 0622) Cardiac Rhythm:  [-] A-V Sequential paced (09/02 0733) Resp:  [18-20] 20 (09/02 0622) BP: (95-115)/(58-93) 95/58 mmHg (09/02 0622) SpO2:  [98 %-100 %] 98 % (09/02 0622) Weight:  [205 lb 0.4 oz (93 kg)] 205 lb 0.4 oz (93 kg) (09/02 0500)  Intake/Output from previous day: 09/01 0701 - 09/02 0700 In: 1090 [P.O.:720; I.V.:120; IV Piggyback:250] Out: 1150 [Urine:1150]  General appearance: alert, cooperative and no distress Heart: regular rate and rhythm and paced Lungs: clear to auscultation bilaterally Abdomen: soft, non-tender; bowel sounds normal; no masses,  no organomegaly Extremities: edema 2-3+ pitting Wound: clean and dry  Lab Results:  Recent Labs  08/04/14 0532 08/05/14 0445  WBC 11.9* 11.4*  HGB 9.5* 9.4*  HCT 29.8* 29.6*  PLT 213 162   BMET:  Recent Labs  08/04/14 0532 08/05/14 0445  NA 138 135*  K 4.9 4.6  CL 99 97  CO2 25 25  GLUCOSE 104* 102*  BUN 67* 66*  CREATININE 3.34* 3.36*  CALCIUM 8.1* 8.2*    PT/INR:  Recent Labs  08/05/14 0445  LABPROT 17.5*  INR 1.43   ABG    Component Value Date/Time   PHART 7.395 07/25/2014 2059   HCO3 19.5* 07/25/2014 2059   TCO2 20 07/25/2014 2059   ACIDBASEDEF 5.0* 07/25/2014 2059   O2SAT 93.0 07/25/2014 2059   CBG (last 3)  No results found for this basename: GLUCAP,  in the last 72 hours  Assessment/Plan: S/P Procedure(s) (LRB): PERMANENT PACEMAKER INSERTION (N/A)  1. CV- S/P PPM working well, on Amiodarone at 200 mg BID 2. INR 1.43- will increase  Coumadin to 4 mg daily 3. Renal- CKD Stage III pitting LE edema, creatinine trended up slightly to 3.36, will continue Lasix, Nephrology following 4. Acute blood loss anemia- H/H stable at 9.4 5. ID- Enterococcal Endocarditis- on Ampicillin, Ceftriaxone stop date is 08/13/2014 6. Deconditioning- needs SNF at discharge 7. Dispo- patient progressing, INR dropped this morning, will increase dose, continue Lasix, apply TED HOSE, continue current care    LOS: 30 days    BARRETT, ERIN 08/05/2014   I have seen and examined the patient and agree with the assessment and plan as outlined.  Follow up ECHO reportedly looks good with normal LV function, normal functioning bioprosthetic tissue valves in the aortic and mitral positions, normal RV size and function with moderate tricuspid regurgitation.  There was no pericardial effusion.  Brilee Port H 08/05/2014 3:23 PM

## 2014-08-05 NOTE — Progress Notes (Signed)
CARDIAC REHAB PHASE I   PRE:  Rate/Rhythm: pacing 91  BP:  Supine:   Sitting: 100/52  Standing:    SaO2: 97%RA  MODE:  Ambulation: 350 ft   POST:  Rate/Rhythm: pacing 95   BP:  Supine:   Sitting: 114/70  Standing:    SaO2: 94%RA 0929-1002 Pt walked 350 ft after RN put TEDS on. Used gait belt, rolling walker and asst x 1. Pt stated he felt more SOB today so did not increase distance. Took several standing rest breaks. Did not sleep well last night. To recliner with call bell. Wife in room.   Graylon Good, RN BSN  08/05/2014 9:59 AM

## 2014-08-05 NOTE — Progress Notes (Signed)
  Echocardiogram 2D Echocardiogram has been performed.  Darlina Sicilian M 08/05/2014, 9:25 AM

## 2014-08-05 NOTE — Progress Notes (Signed)
Assessment/ Plan:   1. Non oliguric- Acute on CKD: Baseline Scr ~1.2. Urine output about one liter 2. Hypokalemia- resolved 3. Complete heart block: s/p PPM placement  4. Thrombocytopenia: ?etio now resolved. Prednisone discontinued. 5. Enterococcus endocarditis: s/p MVR, Aortic root replacement, CABG, ID following 6. Volume overload Plan: Increase Furosemide IV ongoing for volume  Subjective: Interval History: Foley out and voiding, c/o swelling  Objective: Vital signs in last 24 hours: Temp:  [97.5 F (36.4 C)-98.2 F (36.8 C)] 97.5 F (36.4 C) (09/02 0622) Pulse Rate:  [87-95] 87 (09/02 0622) Resp:  [18-20] 20 (09/02 0622) BP: (95-115)/(58-93) 95/58 mmHg (09/02 0622) SpO2:  [98 %-100 %] 98 % (09/02 0622) Weight:  [93 kg (205 lb 0.4 oz)] 93 kg (205 lb 0.4 oz) (09/02 0500) Weight change: 1.5 kg (3 lb 4.9 oz)  Intake/Output from previous day: 09/01 0701 - 09/02 0700 In: 1090 [P.O.:720; I.V.:120; IV Piggyback:250] Out: 1150 [Urine:1150] Intake/Output this shift:    General appearance: alert and cooperative Back: tr presacral edema Chest wall: sternotomy Cardio: regular rate and rhythm, S1, S2 normal, no murmur, click, rub or gallop Extremities: swollen forearms and BLE 2-3+  Lungs decreased BS   Lab Results:  Recent Labs  08/04/14 0532 08/05/14 0445  WBC 11.9* 11.4*  HGB 9.5* 9.4*  HCT 29.8* 29.6*  PLT 213 162   BMET:  Recent Labs  08/04/14 0532 08/05/14 0445  NA 138 135*  K 4.9 4.6  CL 99 97  CO2 25 25  GLUCOSE 104* 102*  BUN 67* 66*  CREATININE 3.34* 3.36*  CALCIUM 8.1* 8.2*   No results found for this basename: PTH,  in the last 72 hours Iron Studies: No results found for this basename: IRON, TIBC, TRANSFERRIN, FERRITIN,  in the last 72 hours Studies/Results: No results found.  Scheduled: . amiodarone  200 mg Oral BID  . ampicillin (OMNIPEN) IV  2 g Intravenous 3 times per day  . antiseptic oral rinse  7 mL Mouth Rinse QID  . aspirin EC  81  mg Oral Daily  . atorvastatin  10 mg Oral QPM  . cefTRIAXone (ROCEPHIN)  IV  2 g Intravenous Q12H  . chlorhexidine  15 mL Mouth Rinse BID  . darbepoetin (ARANESP) injection - NON-DIALYSIS  150 mcg Subcutaneous Once  . enoxaparin (LOVENOX) injection  30 mg Subcutaneous Q24H  . feeding supplement (ENSURE COMPLETE)  237 mL Oral BID BM  . folic acid  2 mg Oral Daily  . furosemide  80 mg Intravenous TID  . pantoprazole  40 mg Oral Daily  . pyridOXINE  100 mg Oral QPM  . sodium chloride  10-40 mL Intracatheter Q12H  . tamsulosin  0.4 mg Oral QHS  . vitamin B-12  1,000 mcg Oral QPM  . vitamin E  400 Units Oral Daily  . warfarin  4 mg Oral q1800  . Warfarin - Physician Dosing Inpatient   Does not apply q1800     LOS: 30 days   Marlane Hirschmann C 08/05/2014,8:39 AM

## 2014-08-05 NOTE — Progress Notes (Signed)
Mr. Sawyers is progressing slowly. He is walking a little bit more. His appetite and was able been decreased. He really is not too "keen" on for that is getting. I don't think it would her him to have a regular diet. I would don't think that cardiac disease is a problem for him. I told his wife to try to bring in some food that he might like from the outside.  His blood counts are holding up pretty steady. His platelet count is 162. His hemoglobin is 9.4. Again, with his renal insufficiency, I think that he will have anemia to some degree. He certainly would benefit from some Aranesp.  He is on Coumadin. His INR is 1.43 today. I suppose that he is still in the hospital awaiting his Coumadin to get to a therapeutic level.  His blood pressure is little on the low side. Is 95/58. His heart rate is 87. Temperature is 97.5. His lungs are clear. Cardiac exam regular in rhythm with no murmurs rubs or bruits. Abdomen is soft. Has good bowel sounds. There is no fluid wave. There is no palpable liver or spleen tip. Extremities shows 2+ edema in his legs.  Again, not much for [place] to do right now. I will see about giving him another dose of Aranesp while he is in the hospital. His blood pressure certainly is okay for this.  [place] 55:22                                                      n"

## 2014-08-05 NOTE — Progress Notes (Signed)
Patient: Justin [name] / Admit Date: 07/19/2014 / Date of Encounter: 08/05/2014, 8:42 AM   Subjective: Feels "ok" - no acute symptoms other than persistent LEE.   Objective: Telemetry: V paced rare PVC Physical Exam: Blood pressure 95/58, pulse 87, temperature 97.5 F (36.4 C), temperature source Oral, resp. rate 20, height 5\' 11"  (1.803 m), weight 205 lb 0.4 oz (93 kg), SpO2 98.00%. General: Frail elderly WM in no acute distress. Head: Normocephalic, atraumatic, sclera non-icteric, no xanthomas, nares are without discharge. Neck: Supple Lungs: Clear bilaterally to auscultation without wheezes, rales, or rhonchi. Breathing is unlabored. Heart: RRR S1 S2 without murmurs, rubs, or gallops. Central sternal fresh scar without significant erythema, no dehiscence. L upper pacemaker site with mild ecchymosis only Abdomen: Soft, non-tender, non-distended with normoactive bowel sounds. No rebound/guarding. Extremities: No clubbing or cyanosis. 2+ edema. Distal pedal pulses are 2+ and equal bilaterally. Neuro: Alert and oriented X 3. Moves all extremities spontaneously. Psych:  Responds to questions appropriately with a normal affect.   Intake/Output Summary (Last 24 hours) at 08/05/14 0842 Last data filed at 08/05/14 0840  Gross per 24 hour  Intake   1450 ml  Output   1150 ml  Net    300 ml    Inpatient Medications:  . amiodarone  200 mg Oral BID  . ampicillin (OMNIPEN) IV  2 g Intravenous 3 times per day  . antiseptic oral rinse  7 mL Mouth Rinse QID  . aspirin EC  81 mg Oral Daily  . atorvastatin  10 mg Oral QPM  . cefTRIAXone (ROCEPHIN)  IV  2 g Intravenous Q12H  . chlorhexidine  15 mL Mouth Rinse BID  . darbepoetin (ARANESP) injection - NON-DIALYSIS  150 mcg Subcutaneous Once  . enoxaparin (LOVENOX) injection  30 mg Subcutaneous Q24H  . feeding supplement (ENSURE COMPLETE)  237 mL Oral BID BM  . folic acid  2 mg Oral Daily  . furosemide  80 mg Intravenous TID  . pantoprazole  40  mg Oral Daily  . pyridOXINE  100 mg Oral QPM  . sodium chloride  10-40 mL Intracatheter Q12H  . tamsulosin  0.4 mg Oral QHS  . vitamin B-12  1,000 mcg Oral QPM  . vitamin E  400 Units Oral Daily  . warfarin  4 mg Oral q1800  . Warfarin - Physician Dosing Inpatient   Does not apply q1800   Infusions:  . sodium chloride 10 mL/hr at 08/04/14 2000    Labs:  Recent Labs  08/04/14 0532 08/05/14 0445  NA 138 135*  K 4.9 4.6  CL 99 97  CO2 25 25  GLUCOSE 104* 102*  BUN 67* 66*  CREATININE 3.34* 3.36*  CALCIUM 8.1* 8.2*  PHOS 3.4  --     Recent Labs  08/04/14 0532 08/05/14 0445  AST  --  76*  ALT  --  27  ALKPHOS  --  179*  BILITOT  --  0.3  PROT  --  5.8*  ALBUMIN 2.0* 2.0*    Recent Labs  08/04/14 0532 08/05/14 0445  WBC 11.9* 11.4*  HGB 9.5* 9.4*  HCT 29.8* 29.6*  MCV 102.1* 105.0*  PLT 213 162   No results found for this basename: CKTOTAL, CKMB, TROPONINI,  in the last 72 hours No components found with this basename: POCBNP,  No results found for this basename: HGBA1C,  in the last 72 hours   Radiology/Studies:  Ct Abdomen Pelvis Wo Contrast  07/09/2014   CLINICAL DATA:  Enterococcus bacteremia. Evaluate for abdominal source. Evaluate for potential tumor. Enterobacter is also present in the urine.  EXAM: CT ABDOMEN AND PELVIS WITHOUT CONTRAST  TECHNIQUE: Multidetector CT imaging of the abdomen and pelvis was performed following the standard protocol without IV contrast.  COMPARISON:  No priors.  FINDINGS: Lung Bases: Atherosclerotic calcifications in the right coronary artery. Pacemaker lead terminating in the right ventricular apex. Moderate-sized hiatal hernia. Moderate to large bilateral pleural effusions which appear to layer dependently and are associated with extensive passive atelectasis in the visualized portions of the lower lobes of the lungs bilaterally.  Abdomen/Pelvis: 9 mm low attenuation lesion in segment 2 of the liver is incompletely characterized  on today's noncontrast CT examination, but is unchanged, and favored to represent a tiny cyst. No other focal hepatic lesions are identified on today's non contrast CT examination. There is a tiny calcifications in the head of the pancreas (image 31 of series 2) which appears to be immediately posterior to the distal common bile duct, potentially related to prior episodes of pancreatitis. Other small calcifications are also noted in the head and tail of the pancreas as well. The calcification in the head of the pancreas is not favored to be within the common bile duct, as the common bile duct is normal in caliber measuring 4 mm, and there is no intrahepatic biliary ductal dilatation to suggest obstruction. The unenhanced appearance of the gallbladder, spleen and bilateral adrenal glands is unremarkable. There is bilateral perinephric stranding which is nonspecific. 1.5 cm intermediate attenuation lesion extending exophytically off the lateral aspect of the upper pole of the right kidney is incompletely characterized on today's non contrast CT examination, but is statistically likely to represent a mildly proteinaceous cyst.  Normal appendix. Trace volume of ascites. No pneumoperitoneum. No pathologic distention of small bowel. 10 mm short axis inguinal lymph nodes bilaterally are nonspecific. No other definite lymphadenopathy identified in the abdomen or pelvis on today's non contrast CT examination. Mild diffuse mesenteric edema. Prostate gland is enlarged measuring 4.9 x 6.1 cm. Urinary bladder is generally unremarkable in appearance, however, the attenuation values in the urine are rather high ranging from 45-55 HU, suggesting proteinaceous or hemorrhagic contents.  Musculoskeletal: Mild diffuse body wall edema. There are no aggressive appearing lytic or blastic lesions noted in the visualized portions of the skeleton.  IMPRESSION: 1. No definite source for bacteremia identified on today's examination. 2.  Relatively high attenuation of the urine is of uncertain etiology and significance, but may suggest some proteinaceous or hemorrhagic contents. 3. Findings suggestive of anasarca, including moderate to large bilateral pleural effusions, trace volume of ascites, mild diffuse mesenteric edema and diffuse body wall edema. 4. Prostatomegaly. 5. Additional incidental findings, as above.   Electronically Signed   By: Vinnie Langton M.D.   On: 07/09/2014 12:46   Dg Orthopantogram  07/15/2014   CLINICAL DATA:  Endocarditis.  EXAM: ORTHOPANTOGRAM/PANORAMIC  COMPARISON:  None.  FINDINGS: There are multiple missing teeth and numerous metallic restorations. However, there is no evidence of a periapical abscess or osteomyelitis or other acute abnormality.  IMPRESSION: No periapical abscesses or other acute abnormalities.   Electronically Signed   By: Rozetta Nunnery M.D.   On: 07/15/2014 21:43   Dg Chest 2 View  08/03/2014   CLINICAL DATA:  CHF follow-up, pacemaker inserted 3 days ago, open heart surgery 2 weeks ago.  EXAM: CHEST  2 VIEW  COMPARISON:  08/02/2014  FINDINGS: Status post median sternotomy and CABG. Left atrial  appendage clip and valve replacement. Right chest wall battery pack with lead tips projecting over the right atrium and right ventricle. Small right pleural effusion with associated airspace opacity. There are small biapical pneumothoraces. Aortic atherosclerosis and mild tortuosity. Heart size upper normal to mildly enlarged. Multilevel degenerative changes  IMPRESSION: Postoperative changes as above.  Tiny biapical pneumothoraces.  Small right pleural effusion with associated airspace opacity; atelectasis versus pneumonia, increased in the interval.   Electronically Signed   By: Carlos Levering M.D.   On: 08/03/2014 06:57   Dg Chest 2 View  07/21/2014   CLINICAL DATA:  Pacemaker removal  EXAM: CHEST  2 VIEW  COMPARISON:  07/16/2014  FINDINGS: Cardiomediastinal silhouette is stable. No acute  infiltrate or pneumothorax. No pulmonary edema. Right arm PICC line is unchanged in position. Trace left pleural effusion with left basilar atelectasis.  IMPRESSION: No pneumothorax. No acute infiltrate or pulmonary edema. Trace left pleural effusion with left basilar atelectasis   Electronically Signed   By: Lahoma Crocker M.D.   On: 07/21/2014 08:10   Dg Chest 2 View  07/15/2014   CLINICAL DATA:  Pacemaker extraction  EXAM: CHEST  2 VIEW  COMPARISON:  07/07/2014  FINDINGS: Cardiac pacer has been removed with generator and 2 leads absent. Mild interstitial change in peribronchial cuffing. These findings are less prominent when compared to the prior study. There is consolidation in the medial left lung base which appears most consistent with atelectasis.  IMPRESSION: 1. Mild residual interstitial change may reflect minimal remaining interstitial pulmonary edema 2. Left lower lobe atelectasis   Electronically Signed   By: Skipper Cliche M.D.   On: 07/15/2014 08:15   Dg Chest 2 View  07/31/2014   CLINICAL DATA:  Fever, chills, shortness of breath.  EXAM: CHEST  2 VIEW  COMPARISON:  06/10/2014  FINDINGS: Left pacer in place with leads in the right atrium and right ventricle. Heart is normal size. Mild peribronchial thickening. No confluent airspace opacities or effusions. No acute bony abnormality.  IMPRESSION: Bronchitic changes.   Electronically Signed   By: Rolm Baptise M.D.   On: 07/23/2014 15:07   Ct Head Wo Contrast  07/09/2014   CLINICAL DATA:  Hallucinations.  Thrombocytopenia  EXAM: CT HEAD WITHOUT CONTRAST  TECHNIQUE: Contiguous axial images were obtained from the base of the skull through the vertex without intravenous contrast.  COMPARISON:  None.  FINDINGS: Mild atrophy. Negative for hemorrhage. Negative for acute infarct or mass. Calvarium intact.  IMPRESSION: No acute abnormality.   Electronically Signed   By: Franchot Gallo M.D.   On: 07/09/2014 18:47   Ct Angio Chest W/cm &/or Wo Cm  07/20/2014    CLINICAL DATA:  Shortness of breath and fever  EXAM: CT ANGIOGRAPHY CHEST WITH CONTRAST  TECHNIQUE: Multidetector CT imaging of the chest was performed using the standard protocol during bolus administration of intravenous contrast. Multiplanar CT image reconstructions and MIPs were obtained to evaluate the vascular anatomy.  CONTRAST:  50mL OMNIPAQUE IOHEXOL 350 MG/ML SOLN  COMPARISON:  Chest radiograph July 06, 2014  FINDINGS: There is no demonstrable pulmonary embolus. There is no thoracic aortic aneurysm or dissection.  There are free-flowing pleural effusions bilaterally. There is hazy ground-glass opacity diffusely.  On axial CT slice 55 series 6, there is a 6 x 4 mm nodular opacity in the medial segment of the right middle lobe. On axial slice 64 series 6, there is a 6 x 4 mm nodular opacity in the lateral segment  of the left lower lobe. On axial slice 62 series 6, there is a 7 x 5 mm nodular opacity in the lateral segment of the right middle lobe. On axial slice 69 series 6, there is a 5 x 4 mm nodular opacity in the superior segment of the right lower lobe.  There is no appreciable thoracic adenopathy. Pericardium is not thickened. Pacemaker leads are attached to the right atrium and right ventricle. There is a focal hiatal hernia.  Visualized upper abdominal structures appear unremarkable. There are no blastic or lytic bone lesions. There is degenerative change in the thoracic spine. Thyroid appears unremarkable.  Review of the MIP images confirms the above findings.  IMPRESSION: No demonstrable pulmonary embolus.  Bilateral effusions with extensive ground-glass opacity bilaterally. Suspect alveolar edema with findings consistent with congestive heart failure. Atypical infection or allergic type phenomenon are differential considerations, however.  Several nodular opacities, largest measuring 7 mm. Followup of these nodular opacity should be based on Fleischner Society guidelines. If the patient is at  high risk for bronchogenic carcinoma, follow-up chest CT at 3-56months is recommended. If the patient is at low risk for bronchogenic carcinoma, follow-up chest CT at 6-12 months is recommended. This recommendation follows the consensus statement: Guidelines for Management of Small Pulmonary Nodules Detected on CT Scans: A Statement from the [place] as published in Radiology 2005; 237:395-400.  Focal hiatal hernia.  No adenopathy.   Electronically Signed   By: Lowella Grip M.D.   On: 07/24/2014 18:09   US Renal  07/28/2014   CLINICAL DATA:  Acute renal failure.  EXAM: RENAL/URINARY TRACT ULTRASOUND COMPLETE  COMPARISON:  CT dated 07/09/2014.  FINDINGS: Right Kidney:  Length: 11.2 cm. 1.7 x 1.4 x 1.3 cm exophytic hypoechoic mass arising from the mid to upper right kidney, laterally. This measured 20 Hounsfield units on the recent noncontrast CT examination. Otherwise, normal appearing right kidney without hydronephrosis.  Left Kidney:  Length: 10.5 cm. Echogenicity within normal limits. No mass or hydronephrosis visualized.  Bladder:  Nondistended, with a Foley catheter in place.  A small amount of free peritoneal fluid is noted.  IMPRESSION: 1. No hydronephrosis. 2. 1.7 cm hypoechoic mass rising from the mid upper right kidney, laterally. Again, this most likely represents a proteinaceous cyst. A solid mass is less likely. 3. Small amount of ascites.   Electronically Signed   By: Enrique Sack M.D.   On: 07/28/2014 00:47   Dg Chest Port 1 View  08/02/2014   CLINICAL DATA:  Status post chest tube removal  EXAM: PORTABLE CHEST - 1 VIEW  COMPARISON:  08/02/2014  FINDINGS: Cardiac shadow is stable. Postsurgical changes are again seen. A pacing device is again noted. A left-sided PICC line is seen in the proximal superior vena cava. The left chest tube has been removed. A tiny residual pneumothorax is noted. Patchy airspace disease in the left lung base is again seen and stable.  IMPRESSION: Tiny  residual pneumothorax following chest tube removal.  Stable left basilar airspace disease.   Electronically Signed   By: Inez Catalina M.D.   On: 08/02/2014 11:31   Dg Chest Portable 1 View  07/21/2014   CLINICAL DATA:  Post implant.  Evaluate for pneumothorax.  EXAM: PORTABLE CHEST - 1 VIEW  COMPARISON:  07/21/2014  FINDINGS: Right pacer has in place with leads in the right atrium and right ventricle. Left chest tube and left PICC line remain in place, unchanged. No pneumothorax. Patchy airspace disease in the  left lung again noted, stable. Minimal right base atelectasis.  IMPRESSION: Right pacer placement.  No pneumothorax.  Otherwise no change.   Electronically Signed   By: Rolm Baptise M.D.   On: 07/30/2014 21:35   Dg Chest Port 1 View  07/21/2014   CLINICAL DATA:  New left PICC  EXAM: PORTABLE CHEST - 1 VIEW  COMPARISON:  07/30/2014  FINDINGS: Left PICC has its tip in the lower superior vena cava, well positioned. The tip lies adjacent to the indwelling right PICC, which is unchanged from the prior study.  Changes from cardiac surgery are stable.  Left and right chest tubes are stable.  No pneumothorax.  Mild irregular interstitial thickening with patchy hazy airspace opacity is noted in the left mid lung, without change. Right mid lung zone patchy airspace opacity has improved, likely improved atelectasis.  IMPRESSION: 1. New left PICC is well positioned. 2. Improved small area patchy airspace opacity in the right mid lung, likely improved atelectasis. 3. No other change.  No pneumothorax.   Electronically Signed   By: Lajean Manes M.D.   On: 07/30/2014 10:22   Dg Chest Port 1 View  07/30/2014   CLINICAL DATA:  follow-up CHF  EXAM: PORTABLE CHEST - 1 VIEW  COMPARISON:  07/28/2014  FINDINGS: Previous CABG, valve replacement surgery, and left apical clip placement. Bilateral chest tubes in place with no pneumothorax. Right PICC line stable. Right IJ central line has been removed. Mediastinal drain has  been removed. Patchy airspace opacities in mid lung zones left greater than right persist. No definite effusion although the left lateral costophrenic angle is obscured by overlying monitoring hardware. Heart size remains normal. Atheromatous aorta.  IMPRESSION: 1. Removal of right IJ central line and mediastinal drain. Otherwise stable appearance since previous exam.   Electronically Signed   By: Arne Cleveland M.D.   On: 07/30/2014 07:53   Dg Chest Port 1 View  07/28/2014   CLINICAL DATA:  Aortic root replacement and CABG  EXAM: PORTABLE CHEST - 1 VIEW  COMPARISON:  Prior chest x-ray 07/27/2014  FINDINGS: Stable position of right IJ vascular sheath and right upper extremity PICC. Catheter tips are within the SVC. Mediastinal and bilateral chest tubes are also unchanged. Patient is status post median sternotomy with evidence of aortic valve replacement, ligation of the left atrial appendage and multivessel CABG. Stable cardiac and mediastinal contours. Atherosclerotic calcification in the transverse aorta. Similar to incrementally increased patchy opacity in the periphery of the left mid lung. No pneumothorax, significant pleural effusion or evidence of edema.  IMPRESSION: 1. Stable and satisfactory support apparatus. 2. Similar to incrementally increased patchy opacity in the periphery of the left mid lung. Differential considerations include shifting atelectasis, focal alveolar hemorrhage, and potentially developing infiltrate. 3. Otherwise, unchanged appearance of the chest.   Electronically Signed   By: Jacqulynn Cadet M.D.   On: 07/28/2014 08:04   Dg Chest Port 1 View  07/27/2014   CLINICAL DATA:  Status post heart surgery.  EXAM: PORTABLE CHEST - 1 VIEW  COMPARISON:  07/26/2014  FINDINGS: The Swan-Ganz catheter has been removed. The right IJ Cordis is still in place. The right PICC line is unchanged. Stable bilateral chest tubes without pneumothorax. Persistent but slightly improved areas bilateral  atelectasis. No definite pleural effusions.  IMPRESSION: Stable support apparatus.  No pneumothorax.  Slight improved lung aeration with resolving areas of atelectasis.   Electronically Signed   By: Kalman Jewels M.D.   On: 07/27/2014 07:42  Dg Chest Port 1 View  07/26/2014   CLINICAL DATA:  Status post cardiac procedures  EXAM: PORTABLE CHEST - 1 VIEW  COMPARISON:  Portable chest x-ray of July 25, 2014  FINDINGS: The right lung is adequately inflated. There is no pneumothorax or pleural effusion. The interstitial markings have improved. The chest tube tip on the right is unchanged in the lateral costophrenic gutter.  The left lung is well-expanded. The interstitial markings remain increased but have improved. There is no pneumothorax. The left upper chest tube is unchanged in position. A chest tube at the level of the left hemidiaphragm also appears stable. An additional tube to the left of the thoracic spine is demonstrated with its tip at the level of the AP window.  The cardiac silhouette is top-normal in size. A left atrial appendage clip is present. A mitral valve ring is demonstrated. There is a Swan-Ganz catheter whose tip lies in the proximal right descending pulmonary artery. There are coronary the artery bypass rings present.  IMPRESSION: There has been mild interval improvement in the appearance of the pulmonary interstitium especially on the right. The support tubes and lines are in reasonable position.   Electronically Signed   By: David  Martinique   On: 07/26/2014 07:50   Dg Chest Portable 1 View In Am  07/25/2014   CLINICAL DATA:  Postop CABG.  EXAM: PORTABLE CHEST - 1 VIEW  COMPARISON:  08/02/2014 and 07/21/2014.  FINDINGS: 0650 hr. Mild patient rotation to the right. Endotracheal tube tip remains in the midtrachea. The Swan-Ganz catheter has been partially withdrawn into the right main pulmonary artery. Right arm PICC, nasogastric tube and chest tubes remain in place. The heart size is  stable status post median sternotomy and valve replacement. There is mild residual asymmetric airspace disease in the left lung. No pneumothorax or significant pleural effusion is identified.  IMPRESSION: 1. Partial withdrawal of the Swan-Ganz catheter. The support system is otherwise unchanged. 2. Persistent asymmetric left perihilar airspace disease, likely atelectasis or asymmetric edema. No pneumothorax.   Electronically Signed   By: Camie Patience M.D.   On: 07/25/2014 09:05   Dg Chest Portable 1 View  08/02/2014   CLINICAL DATA:  Postoperative  EXAM: PORTABLE CHEST - 1 VIEW  COMPARISON:  PA and lateral chest of July 21, 2014  FINDINGS: The lungs are well-expanded. The interstitial markings are increased diffusely on the left. There is left lower lobe atelectasis. The pulmonary vascularity is indistinct. The cardiac silhouette is normal in size. There is a left atrial appendage clip present. A mitral valve ring is present.  The endotracheal tube tip lies 4.4 cm above the crotch of the carina. The Swan-Ganz type catheter tip is in a right descending pulmonary artery and may be wedged. The esophagogastric tube tip projects below the inferior margin of the image. The left upper chest tube tip overlies the posterior aspect of the third rib. Two mediastinal drains overlye the mid thoracic spine. A lower chest tube is present lying just above the posterior costophrenic gutter on the left. There is a chest tube in place on the right with the tip in the medial costophrenic gutter. There is a PICC line in place whose tip lies in the midportion of the SVC.  IMPRESSION: There is pulmonary interstitial edema, predominantly left-sided. There is left lower lobe atelectasis. There is no significant pleural effusion and no pneumothorax. The support tubes and lines are in appropriate position with exception of the Swan-Ganz catheter whose tip  may be wedged. Withdrawal by 5-6 cm is needed.  These results were called by  telephone at the time of interpretation on 07/04/2014 at 8:47 pm to Charleen Kirks verbally acknowledged these results.   Electronically Signed   By: David  Martinique   On: 07/23/2014 20:50   Dg Chest Port 1 View  07/16/2014   CLINICAL DATA:  Line placement  EXAM: PORTABLE CHEST - 1 VIEW  COMPARISON:  None.  FINDINGS: Right-sided PICC line with the tip projecting over the SVC.  Small left pleural effusion. Trace right pleural effusion with right basilar airspace disease which may reflect atelectasis versus pneumonia. No pneumothorax.  Stable cardiomediastinal silhouette. Thoracic aortic atherosclerosis. Unremarkable osseous structures.  IMPRESSION: 1. Right-sided PICC line with the tip projecting over the SVC. 2. Bilateral small pleural effusions. Right basilar airspace disease which may reflect atelectasis versus pneumonia.   Electronically Signed   By: Kathreen Devoid   On: 07/16/2014 16:00   Dg Chest Port 1 View  07/07/2014   CLINICAL DATA:  Evaluate airspace.  EXAM: PORTABLE CHEST - 1 VIEW  COMPARISON:  CT 08/01/2014.  Chest x-ray 08/01/2014 and 06/10/2014.  FINDINGS: Cardiac pacer with lead tips in right atrium and right ventricle. Mediastinum and hilar structures are unremarkable. Cardiomegaly with mild pulmonary vascular prominence and interstitial prominence consistent with congestive heart failure. Small pleural effusions. No pneumothorax. Reference is made to recent chest CT which demonstrated small pulmonary nodules. These are not definitely identified by chest x-ray. No acute bony abnormality.  IMPRESSION: 1. Persistent changes of congestive heart failure pulmonary edema. Small pleural effusions. 2. Reference is made to recent chest CT which demonstrated small pulmonary nodules. Follow-up is recommended as on prior CT report.   Electronically Signed   By: Marcello Moores  Register   On: 07/07/2014 07:26     Assessment and Plan  1. 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 ID plans 4-6 weeks of abx post-surgery (at least 9/17) to be cautious and will see patient as outpatient. 2. Nonoliguric AKI on CKD (OP Cr's 1.3-1.5) with concomitant chronic diastolic CHF - nephrology managing diuretics. Awaiting echo (being done now). Hypoalbuminemia is likely contributing to LEE as well. 3. PAF with symptomatic bradycardia due to CHB s/p PPM 04/2013, s/p extraction 07/23/2014, s/p Medtronic PPM 07/18/2014. Also with hx of WPW. Now on amiodarone. Coumadin being titrated with DVT dosed Lovenox in place. Depending on when he is discharged, he will need wound check with device interrogation ~ 08/10/14. If he is still inpatient we can do this here. If not will need to schedule as outpatient. 4. Heme: macrocytic anemia and h/o ITP - thrombocytopenia resolved, anemia remains relatively stable. Heme following. Recent B12 elevated, folate OK in June. 5. CAD s/p CABGx1 - will defer to MD duration of aspirin given concomitant Coumadin.  Not on BB with soft BPs. On low dose statin. 6. Ancillary CT findings (liver cyst, kidney cyst, prostatic enlargement, several pulmonary nodules) - per primary team. Will need f/u PCP.  Signed, Melina Copa PA-C As above, patient seen and examined. Remains severely volume overloaded. Continue Lasix and follow renal function. Likely component of low oncotic pressure related to low albumin. Followup echocardiogram pending. Continue antibiotics for endocarditis. Kirk Ruths

## 2014-08-05 NOTE — Progress Notes (Signed)
Pt ambulated in hall with walker 137ft. Pt tolerated fair, rested x3 by stopping hall. Pt tired on return.Will continue to assess. Pt back to chair with feet elevated, call bell in reach and wife at bedside.

## 2014-08-05 NOTE — Clinical Social Work Note (Signed)
Clinical Social Worker continuing to follow patient and family for support and discharge planning needs.  CSW spoke with [facility] SNF admissions coordinator who states that patient will have a bed available once medically ready.  Facility states that if given appropriate notice, they will provide patient and wife with transportation from hospital to [facility].  CSW remains available for support and to facilitate patient discharge needs once medically ready.  Barbette Or, [place]

## 2014-08-06 LAB — CBC
HCT: 29.3 % — ABNORMAL LOW (ref 39.0–52.0)
HEMOGLOBIN: 9.1 g/dL — AB (ref 13.0–17.0)
MCH: 32 pg (ref 26.0–34.0)
MCHC: 31.1 g/dL (ref 30.0–36.0)
MCV: 103.2 fL — AB (ref 78.0–100.0)
RBC: 2.84 MIL/uL — ABNORMAL LOW (ref 4.22–5.81)
RDW: 22.5 % — ABNORMAL HIGH (ref 11.5–15.5)
WBC: 11 10*3/uL — ABNORMAL HIGH (ref 4.0–10.5)

## 2014-08-06 LAB — CARBOXYHEMOGLOBIN
Carboxyhemoglobin: 2.3 % — ABNORMAL HIGH (ref 0.5–1.5)
Methemoglobin: 0.9 % (ref 0.0–1.5)
O2 Saturation: 49.9 %
Total hemoglobin: 10.7 g/dL — ABNORMAL LOW (ref 13.5–18.0)

## 2014-08-06 LAB — PROTIME-INR
INR: 1.57 — ABNORMAL HIGH (ref 0.00–1.49)
Prothrombin Time: 18.8 seconds — ABNORMAL HIGH (ref 11.6–15.2)

## 2014-08-06 MED ORDER — MILRINONE IN DEXTROSE 20 MG/100ML IV SOLN
0.2500 ug/kg/min | INTRAVENOUS | Status: DC
  Administered 2014-08-06 – 2014-08-15 (×18): 0.25 ug/kg/min via INTRAVENOUS
  Filled 2014-08-06 (×17): qty 100

## 2014-08-06 MED ORDER — METOLAZONE 5 MG PO TABS
5.0000 mg | ORAL_TABLET | Freq: Two times a day (BID) | ORAL | Status: DC
  Administered 2014-08-06: 5 mg via ORAL
  Filled 2014-08-06 (×3): qty 1

## 2014-08-06 NOTE — Progress Notes (Signed)
Orthopedic Tech Progress Note Patient Details:  Justin [name] 07/01/1933 [id number]  Ortho Devices Type of Ortho Device: Haematologist Ortho Device/Splint Location: (B) LE Ortho Device/Splint Interventions: Ordered;Application   Braulio Bosch 08/06/2014, 6:23 PM

## 2014-08-06 NOTE — Progress Notes (Signed)
Pt refusing CPAP at this time. He states that he has a machine at home but does not wear it. He said that he sleep sitting up in a recliner at home and that helps. He said he plans to do that tonight here. PT understands to make RN aware if he changes his mind. RN aware. RT will monitor

## 2014-08-06 NOTE — Progress Notes (Addendum)
Dr. Florene Glen paged, sbp 80-90's, clarification of lasix bolus to still be given. @ 1600. Kathleen Argue S 03:55PM  No return page, paged for 2nd time. Sherrie Mustache 4:41 PM

## 2014-08-06 NOTE — Progress Notes (Signed)
  Patient seen and examined.  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   Echo with LVEF ~50% RV mild to moderately HK with Tapse 1.3. Cr 1.5 -> 3.4. Weight up 18 pounds from pre-op. Co-ox 49.9  On exam  SBP 88-115 JVP 7 Cor RRR +RV lift Lungs decreased at bases Ext 3-4+ edema  He remains massively volume overloaded with low co-ox and worsening renal function suggestive of low output physiology. Surprisingly on exam neck veins don't seem that high. Agree with starting milrinone and continuing IV lasix. Will ask ortho tech to wrap legs to help with 3rd spacing. If not improving, would move to [facility] for closer monitoring of CVPs and co-ox.   HF team would be happy to manage.  Wasif Simonich,MD 10:54 PM

## 2014-08-06 NOTE — Progress Notes (Signed)
PT Cancellation Note  Patient Details Name: Justin [name] MRN: [id number] DOB: [birth year]   Cancelled Treatment:    Reason Eval/Treat Not Completed: Other (comment) (Pt has amb x 2 and will amb again later this evening.)   Maxwel Meadowcroft 08/06/2014, 4:21 PM

## 2014-08-06 NOTE — Progress Notes (Signed)
Assessment/ Plan:   1. Non oliguric- Acute on CKD: Baseline Scr ~1.2. Urine output about one liter 2. Complete heart block: s/p PPM placement  3. Enterococcus endocarditis: s/p MVR, Aortic root replacement, CABG, ID following 4. Volume overload Plan: Furosemide IV ongoing for volume & add metolazone PO  Subjective: Interval History: nO MAJOR INCREASE IN uop  Objective: Vital signs in last 24 hours: Temp:  [97.9 F (36.6 C)-98.2 F (36.8 C)] 98 F (36.7 C) (09/03 0215) Pulse Rate:  [88-95] 88 (09/03 0215) Resp:  [18-20] 20 (09/03 0215) BP: (96-110)/(52-66) 100/53 mmHg (09/03 0215) SpO2:  [98 %-100 %] 98 % (09/03 0215) Weight:  [94.53 kg (208 lb 6.4 oz)] 94.53 kg (208 lb 6.4 oz) (09/03 0215) Weight change: 1.53 kg (3 lb 6 oz)  Intake/Output from previous day: 09/02 0701 - 09/03 0700 In: 1080 [P.O.:1080] Out: 1000 [Urine:1000] Intake/Output this shift:    General appearance: alert and cooperative Resp: diminished breath sounds bibasilar Chest wall: healing sternotomy Extremities: edema 2-3=  Lab Results:  Recent Labs  08/05/14 0445 08/06/14 0505  WBC 11.4* 11.0*  HGB 9.4* 9.1*  HCT 29.6* 29.3*  PLT 162 CONSISTENT WITH PREVIOUS RESULT   BMET:  Recent Labs  08/04/14 0532 08/05/14 0445  NA 138 135*  K 4.9 4.6  CL 99 97  CO2 25 25  GLUCOSE 104* 102*  BUN 67* 66*  CREATININE 3.34* 3.36*  CALCIUM 8.1* 8.2*   No results found for this basename: PTH,  in the last 72 hours Iron Studies: No results found for this basename: IRON, TIBC, TRANSFERRIN, FERRITIN,  in the last 72 hours Studies/Results: No results found.  Scheduled: . amiodarone  200 mg Oral BID  . ampicillin (OMNIPEN) IV  2 g Intravenous 3 times per day  . antiseptic oral rinse  7 mL Mouth Rinse QID  . aspirin EC  81 mg Oral Daily  . atorvastatin  10 mg Oral QPM  . cefTRIAXone (ROCEPHIN)  IV  2 g Intravenous Q12H  . chlorhexidine  15 mL Mouth Rinse BID  . enoxaparin (LOVENOX) injection  30 mg  Subcutaneous Q24H  . feeding supplement (ENSURE COMPLETE)  237 mL Oral BID BM  . folic acid  2 mg Oral Daily  . furosemide  120 mg Intravenous Q6H  . pantoprazole  40 mg Oral Daily  . pyridOXINE  100 mg Oral QPM  . sodium chloride  10-40 mL Intracatheter Q12H  . tamsulosin  0.4 mg Oral QHS  . vitamin B-12  1,000 mcg Oral QPM  . vitamin E  400 Units Oral Daily  . warfarin  4 mg Oral q1800  . Warfarin - Physician Dosing Inpatient   Does not apply q1800      LOS: 31 days   Justin [name] C 08/06/2014,7:49 AM

## 2014-08-06 NOTE — Progress Notes (Signed)
    Subjective:  Denies CP or dyspnea; weak   Objective:  Filed Vitals:   08/05/14 1407 08/05/14 2000 08/05/14 2330 08/06/14 0215  BP: 96/52 110/66  100/53  Pulse: 95 90  88  Temp: 98.2 F (36.8 C) 97.9 F (36.6 C)  98 F (36.7 C)  TempSrc: Oral Oral  Oral  Resp: 18 19  20   Height:      Weight:    208 lb 6.4 oz (94.53 kg)  SpO2: 100% 99% 98% 98%    Intake/Output from previous day:  Intake/Output Summary (Last 24 hours) at 08/06/14 1022 Last data filed at 08/06/14 0953  Gross per 24 hour  Intake   1080 ml  Output   1000 ml  Net     80 ml    Physical Exam: Physical exam: Well-developed frail in no acute distress.  Skin is warm and dry.  HEENT is normal.  Neck is supple.  Chest is clear to auscultation with normal expansion. Pacemaker site with no hematoma Cardiovascular exam is regular rate and rhythm. 2/6 systolic murmur Abdominal exam nontender or distended. No masses palpated. Extremities show 4+ edema. neuro grossly intact    Lab Results: Basic Metabolic Panel:  Recent Labs  08/04/14 0532 08/05/14 0445  NA 138 135*  K 4.9 4.6  CL 99 97  CO2 25 25  GLUCOSE 104* 102*  BUN 67* 66*  CREATININE 3.34* 3.36*  CALCIUM 8.1* 8.2*  PHOS 3.4  --    CBC:  Recent Labs  08/05/14 0445 08/06/14 0505  WBC 11.4* 11.0*  HGB 9.4* 9.1*  HCT 29.6* 29.3*  MCV 105.0* 103.2*  PLT 162 CONSISTENT WITH PREVIOUS RESULT     Assessment/Plan:  1 status post aortic root replacement, aortic valve replacement, mitral valve replacement, coronary artery bypassing graft and maze-Continue rehab. 2 acute on chronic kidney failure-nephrology following. Significantly volume overloaded. Diuresis per nephrology.  3 thrombocytopenia-improved. Hematology following. 4 paroxysmal atrial fibrillation-patient is in AV paced rhythm today. Continue amiodarone and Coumadin. 5 CAD-continue aspirin and statin. 6 status post pacemaker 7 endocarditis-continue antibiotics; will need fu  with ID 8 Volume excess/right heart failure-not making much progress with diuresis; co-ox 50; milrinone added; CHF team to see.  Kirk Ruths 08/06/2014, 10:22 AM

## 2014-08-06 NOTE — Progress Notes (Addendum)
Dr. Florene Glen paged for clarification of bmp draw for this am. Kathleen Argue S 11:07 AM   Okay to dc 0726 order for bmp per MD via phone. Kathleen Argue S 11:12 AM

## 2014-08-06 NOTE — Progress Notes (Signed)
CARDIAC REHAB PHASE I   PRE:  Rate/Rhythm: paced 88  BP:  Supine:   Sitting: 83/46, 85/48  Standing:    SaO2: 97%RA  MODE:  Ambulation: 300 ft   POST:  Rate/Rhythm: 95 paced  BP:  Supine:   Sitting: 121/57  Standing:    SaO2: 97%RA 1025-1056 Pt's BP low but he denied dizziness or lightheadedness. Pt walked 300 ft on RA with gait belt use, rolling walker and asst x 1. Pt stopped many times to rest due to SOB. Said his nose is stuffy too. Pt very motivated as I told him he did not have to walk that far but he wanted to. To recliner after walk with visible SOB. Sats good on RA. BP improved with activity. Wife in room.   Graylon Good, RN BSN  08/06/2014 10:53 AM

## 2014-08-06 NOTE — Progress Notes (Addendum)
      [address]       [place] [address]             [phone]      6 Days Post-Op Procedure(s) (LRB): PERMANENT PACEMAKER INSERTION (N/A)  Subjective:  Justin [name] continues to experience lower extremity edema.  He states he tried the TED hose yesterday, but they were uncomfortable.  + ambulation, + BM  Objective: Vital signs in last 24 hours: Temp:  [97.9 F (36.6 C)-98.2 F (36.8 C)] 98 F (36.7 C) (09/03 0215) Pulse Rate:  [88-95] 88 (09/03 0215) Cardiac Rhythm:  [-] A-V Sequential paced (09/03 0215) Resp:  [18-20] 20 (09/03 0215) BP: (96-110)/(52-66) 100/53 mmHg (09/03 0215) SpO2:  [98 %-100 %] 98 % (09/03 0215) Weight:  [208 lb 6.4 oz (94.53 kg)] 208 lb 6.4 oz (94.53 kg) (09/03 0215)  Intake/Output from previous day: 09/02 0701 - 09/03 0700 In: 1080 [P.O.:1080] Out: 1000 [Urine:1000]  General appearance: alert, cooperative and no distress Heart: regular rate and rhythm Lungs: clear to auscultation bilaterally Abdomen: soft, non-tender; bowel sounds normal; no masses,  no organomegaly Extremities: edema 3-4+ pitting edema Wound: clean and dry  Lab Results:  Recent Labs  08/05/14 0445 08/06/14 0505  WBC 11.4* 11.0*  HGB 9.4* 9.1*  HCT 29.6* 29.3*  PLT 162 CONSISTENT WITH PREVIOUS RESULT   BMET:  Recent Labs  08/04/14 0532 08/05/14 0445  NA 138 135*  K 4.9 4.6  CL 99 97  CO2 25 25  GLUCOSE 104* 102*  BUN 67* 66*  CREATININE 3.34* 3.36*  CALCIUM 8.1* 8.2*    PT/INR:  Recent Labs  08/06/14 0505  LABPROT 18.8*  INR 1.57*   ABG    Component Value Date/Time   PHART 7.395 07/25/2014 2059   HCO3 19.5* 07/25/2014 2059   TCO2 20 07/25/2014 2059   ACIDBASEDEF 5.0* 07/25/2014 2059   O2SAT 93.0 07/25/2014 2059   CBG (last 3)  No results found for this basename: GLUCAP,  in the last 72 hours  Assessment/Plan: S/P Procedure(s) (LRB): PERMANENT PACEMAKER INSERTION (N/A)  1. CV- remains hemodynamically stable- continue  Amiodarone 2. INR 1.57, continue Coumadin at 4 mg daily 3. Renal- CKD Stage III, + LE pitting edema no improvement, continue Lasix, Zaroxolyn, Nephrology following 4. Acute Blood Loss Anemia- [name]/[name] stable at 9.1 5. ID-Enterococcal Endocarditis- on Ampicillin, Ceftriaxone stop date is 08/10/2014 6. Deconditioning-  Needs SNF at discharge 7. Dispo- patient progressing, INR trending up, not much improvement in LE edema, Nephrology added Zaroxolyn, continue current care   LOS: 31 days    Justin [name], Justin [name] 08/06/2014  I have seen and examined the patient and agree with the assessment and plan as outlined.  I have personally reviewed Justin [name]'s follow up ECHO performed yesterday.  He has normal LV function and both the aortic and mitral valve are functioning normally.  However, I think he has significant RV dysfunction which may be contributing to his slow progress, and mixed venous co-ox is only 50%.  Will ask the advanced heart failure team to get involved and start low dose milrinone.  Justin [name],Justin [name] 08/06/2014 9:05 AM

## 2014-08-06 NOTE — Progress Notes (Signed)
Per Dr Roxy Manns, okay to give 0800 metolazone with previous bp.  Sherrie Mustache

## 2014-08-06 NOTE — Progress Notes (Signed)
Ambulated 372ft, bp 80s prior to ambulating, after ambulation bp up to 120s. A lot of stops to catch his breath. Kathleen Argue S 2:03 PM

## 2014-08-07 LAB — BASIC METABOLIC PANEL
ANION GAP: 13 (ref 5–15)
Anion gap: 15 (ref 5–15)
BUN: 66 mg/dL — ABNORMAL HIGH (ref 6–23)
BUN: 66 mg/dL — ABNORMAL HIGH (ref 6–23)
CO2: 27 mEq/L (ref 19–32)
CO2: 25 meq/L (ref 19–32)
Calcium: 8.2 mg/dL — ABNORMAL LOW (ref 8.4–10.5)
Calcium: 7.9 mg/dL — ABNORMAL LOW (ref 8.4–10.5)
Chloride: 93 mEq/L — ABNORMAL LOW (ref 96–112)
Chloride: 90 mEq/L — ABNORMAL LOW (ref 96–112)
Creatinine, Ser: 3.44 mg/dL — ABNORMAL HIGH (ref 0.50–1.35)
Creatinine, Ser: 2.65 mg/dL — ABNORMAL HIGH (ref 0.50–1.35)
GFR calc Af Amer: 18 mL/min — ABNORMAL LOW (ref >90)
GFR calc Af Amer: 25 mL/min — ABNORMAL LOW (ref >90)
GFR, EST NON AFRICAN AMERICAN: 16 mL/min — AB (ref >90)
GFR, EST NON AFRICAN AMERICAN: 21 mL/min — AB (ref >90)
Glucose, Bld: 110 mg/dL — ABNORMAL HIGH (ref 70–99)
Glucose, Bld: 231 mg/dL — ABNORMAL HIGH (ref 70–99)
Potassium: 3.9 mEq/L (ref 3.7–5.3)
Potassium: 3.8 mEq/L (ref 3.7–5.3)
SODIUM: 133 meq/L — AB (ref 137–147)
SODIUM: 130 meq/L — AB (ref 137–147)

## 2014-08-07 LAB — RENAL FUNCTION PANEL
ALBUMIN: 1.9 g/dL — AB (ref 3.5–5.2)
Anion gap: 14 (ref 5–15)
BUN: 62 mg/dL — AB (ref 6–23)
CO2: 25 mEq/L (ref 19–32)
Calcium: 7.5 mg/dL — ABNORMAL LOW (ref 8.4–10.5)
Chloride: 96 mEq/L (ref 96–112)
Creatinine, Ser: 3.2 mg/dL — ABNORMAL HIGH (ref 0.50–1.35)
GFR calc Af Amer: 20 mL/min — ABNORMAL LOW (ref >90)
GFR calc non Af Amer: 17 mL/min — ABNORMAL LOW (ref >90)
GLUCOSE: 118 mg/dL — AB (ref 70–99)
PHOSPHORUS: 4.1 mg/dL (ref 2.3–4.6)
Potassium: 3.8 mEq/L (ref 3.7–5.3)
Sodium: 135 mEq/L — ABNORMAL LOW (ref 137–147)

## 2014-08-07 LAB — CARBOXYHEMOGLOBIN
Carboxyhemoglobin: 2.3 % — ABNORMAL HIGH (ref 0.5–1.5)
Carboxyhemoglobin: 2.4 % — ABNORMAL HIGH (ref 0.5–1.5)
METHEMOGLOBIN: 0.5 % (ref 0.0–1.5)
Methemoglobin: 0.7 % (ref 0.0–1.5)
O2 Saturation: 61.4 %
O2 Saturation: 62.2 %
TOTAL HEMOGLOBIN: 15.7 g/dL (ref 13.5–18.0)
Total hemoglobin: 8.9 g/dL — ABNORMAL LOW (ref 13.5–18.0)

## 2014-08-07 LAB — PROTIME-INR
INR: 1.77 — ABNORMAL HIGH (ref 0.00–1.49)
PROTHROMBIN TIME: 20.6 s — AB (ref 11.6–15.2)

## 2014-08-07 MED ORDER — FUROSEMIDE 10 MG/ML IJ SOLN
160.0000 mg | Freq: Four times a day (QID) | INTRAVENOUS | Status: DC
  Administered 2014-08-07 – 2014-08-08 (×5): 160 mg via INTRAVENOUS
  Filled 2014-08-07 (×11): qty 16

## 2014-08-07 MED ORDER — METOLAZONE 5 MG PO TABS
5.0000 mg | ORAL_TABLET | Freq: Once | ORAL | Status: AC
  Administered 2014-08-07: 5 mg via ORAL
  Filled 2014-08-07: qty 1

## 2014-08-07 MED ORDER — DOPAMINE-DEXTROSE 3.2-5 MG/ML-% IV SOLN
2.0000 ug/kg/min | INTRAVENOUS | Status: DC
  Administered 2014-08-07: 2 ug/kg/min via INTRAVENOUS
  Administered 2014-08-10 – 2014-08-11 (×2): 3 ug/kg/min via INTRAVENOUS
  Filled 2014-08-07 (×3): qty 250

## 2014-08-07 NOTE — Progress Notes (Addendum)
[address]       ,[place] [address]             [phone]      7 Days Post-Op Procedure(s) (LRB): PERMANENT PACEMAKER INSERTION (N/A) Subjective: Feels ok, now on milrinone gtt and heart failure team assisting with management  Objective: Vital signs in last 24 hours: Temp:  [97.7 F (36.5 C)-98 F (36.7 C)] 97.7 F (36.5 C) (09/04 0426) Pulse Rate:  [89-94] 92 (09/04 0426) Cardiac Rhythm:  [-] A-V Sequential paced;Ventricular paced (09/03 1930) Resp:  [17-20] 17 (09/04 0426) BP: (88-122)/(44-63) 99/56 mmHg (09/04 0426) SpO2:  [96 %-100 %] 96 % (09/04 0426) Weight:  [211 lb 11.2 oz (96.026 kg)] 211 lb 11.2 oz (96.026 kg) (09/04 0426)  Hemodynamic parameters for last 24 hours:    Intake/Output from previous day: 09/03 0701 - 09/04 0700 In: 959 [P.O.:600; I.V.:85; IV Piggyback:274] Out: 880 [Urine:880] Intake/Output this shift:    General appearance: alert, cooperative and no distress Heart: regular rate and rhythm and soft sysstolic murmur Lungs: dim in bases Abdomen: benign Extremities: + edema Wound: incis healing well  Lab Results:  Recent Labs  08/05/14 0445 08/06/14 0505  WBC 11.4* 11.0*  HGB 9.4* 9.1*  HCT 29.6* 29.3*  PLT 162 CONSISTENT WITH PREVIOUS RESULT   BMET:  Recent Labs  08/05/14 0445 08/07/14 0533  NA 135* 133*  K 4.6 3.9  CL 97 93*  CO2 25 27  GLUCOSE 102* 110*  BUN 66* 66*  CREATININE 3.36* 3.44*  CALCIUM 8.2* 8.2*    PT/INR:  Recent Labs  08/07/14 0533  LABPROT 20.6*  INR 1.77*   ABG    Component Value Date/Time   PHART 7.395 07/25/2014 2059   HCO3 19.5* 07/25/2014 2059   TCO2 20 07/25/2014 2059   ACIDBASEDEF 5.0* 07/25/2014 2059   O2SAT 61.4 08/07/2014 0530   CBG (last 3)  No results found for this basename: GLUCAP,  in the last 72 hours  Meds Scheduled Meds: . amiodarone  200 mg Oral BID  . ampicillin (OMNIPEN) IV  2 g Intravenous 3 times per day  . antiseptic oral rinse  7 mL Mouth  Rinse QID  . aspirin EC  81 mg Oral Daily  . atorvastatin  10 mg Oral QPM  . cefTRIAXone (ROCEPHIN)  IV  2 g Intravenous Q12H  . chlorhexidine  15 mL Mouth Rinse BID  . enoxaparin (LOVENOX) injection  30 mg Subcutaneous Q24H  . feeding supplement (ENSURE COMPLETE)  237 mL Oral BID BM  . folic acid  2 mg Oral Daily  . furosemide  120 mg Intravenous Q6H  . pantoprazole  40 mg Oral Daily  . pyridOXINE  100 mg Oral QPM  . sodium chloride  10-40 mL Intracatheter Q12H  . vitamin B-12  1,000 mcg Oral QPM  . vitamin E  400 Units Oral Daily  . warfarin  4 mg Oral q1800  . Warfarin - Physician Dosing Inpatient   Does not apply q1800   Continuous Infusions: . sodium chloride 10 mL/hr at 08/06/14 1701  . milrinone 0.25 mcg/kg/min (08/06/14 2314)   PRN Meds:.acetaminophen, ondansetron (ZOFRAN) IV, ondansetron (ZOFRAN) IV, ondansetron, promethazine, sodium chloride, traMADol, zolpidem  Xrays No results found.  Assessment/Plan: S/P Procedure(s) (LRB): PERMANENT PACEMAKER INSERTION (N/A)  1 conts aggressive CHF management/ aggressive diuresis, + I/O ywsterday but doubt accurate 2 creat is improved 3 INR improving 4 push rehab as able 5 cont abx  LOS: 32 days    GOLD,WAYNE E 08/07/2014  I have seen and examined the patient and agree with the assessment and plan as outlined.  Appreciate assistance from Dr Haroldine Laws and team.  I think RV dysfunction has been playing a significant role.  Co-ox improved since milrinone added.  Hopefully renal function and ability to mobilize fluid will start to improve.  INR trending up - will d/c Lovenox when INR 2.0  Kaliyah Gladman H 08/07/2014 8:46 AM

## 2014-08-07 NOTE — Progress Notes (Signed)
08/07/2014 1030 Nursing note Pt. Transferred to [facility] per orders. RN at bedside to receive patient. Questions and concerns addressed. Pt. Wife updated on plan of care and new room.  Sally Reimers, Arville Lime

## 2014-08-07 NOTE — Progress Notes (Signed)
Assessment/ Plan:   1. Non oliguric- Acute on CKD: Baseline Scr ~1.2. Urine output about one liter 2. Complete heart block: s/p PPM placement  3. Enterococcus endocarditis: s/p MVR, Aortic root replacement, CABG, ID following 4. Volume overload Plan: Increase Furosemide IV ongoing for volume & if fails will proceed with dialysis.  The low BP will represent a challenge potentially for dialysis.  Subjective: Interval History:He did not get the metolazone I ordered. It was cancelled by another provider  Objective: Vital signs in last 24 hours: Temp:  [97.7 F (36.5 C)-98 F (36.7 C)] 97.7 F (36.5 C) (09/04 0426) Pulse Rate:  [89-94] 92 (09/04 0426) Resp:  [17-20] 17 (09/04 0426) BP: (88-122)/(44-63) 99/56 mmHg (09/04 0426) SpO2:  [96 %-100 %] 96 % (09/04 0426) Weight:  [96.026 kg (211 lb 11.2 oz)] 96.026 kg (211 lb 11.2 oz) (09/04 0426) Weight change: 1.497 kg (3 lb 4.8 oz)  Intake/Output from previous day: 09/03 0701 - 09/04 0700 In: 959 [P.O.:600; I.V.:85; IV Piggyback:274] Out: 880 [Urine:880] Intake/Output this shift:    General appearance: alert and cooperative Back: presacral edema Resp: diminished breath sounds bibasilar Extremities: edema wrapped and edematous  Lab Results:  Recent Labs  08/05/14 0445 08/06/14 0505  WBC 11.4* 11.0*  HGB 9.4* 9.1*  HCT 29.6* 29.3*  PLT 162 CONSISTENT WITH PREVIOUS RESULT   BMET:  Recent Labs  08/05/14 0445 08/07/14 0533  NA 135* 133*  K 4.6 3.9  CL 97 93*  CO2 25 27  GLUCOSE 102* 110*  BUN 66* 66*  CREATININE 3.36* 3.44*  CALCIUM 8.2* 8.2*   No results found for this basename: PTH,  in the last 72 hours Iron Studies: No results found for this basename: IRON, TIBC, TRANSFERRIN, FERRITIN,  in the last 72 hours Studies/Results: No results found.  Scheduled: . amiodarone  200 mg Oral BID  . ampicillin (OMNIPEN) IV  2 g Intravenous 3 times per day  . antiseptic oral rinse  7 mL Mouth Rinse QID  . aspirin EC  81 mg  Oral Daily  . atorvastatin  10 mg Oral QPM  . cefTRIAXone (ROCEPHIN)  IV  2 g Intravenous Q12H  . chlorhexidine  15 mL Mouth Rinse BID  . enoxaparin (LOVENOX) injection  30 mg Subcutaneous Q24H  . feeding supplement (ENSURE COMPLETE)  237 mL Oral BID BM  . folic acid  2 mg Oral Daily  . furosemide  120 mg Intravenous Q6H  . pantoprazole  40 mg Oral Daily  . pyridOXINE  100 mg Oral QPM  . sodium chloride  10-40 mL Intracatheter Q12H  . vitamin B-12  1,000 mcg Oral QPM  . vitamin E  400 Units Oral Daily  . warfarin  4 mg Oral q1800  . Warfarin - Physician Dosing Inpatient   Does not apply q1800     LOS: 32 days   Rosemary Mossbarger C 08/07/2014,8:01 AM

## 2014-08-07 NOTE — Progress Notes (Signed)
Rehab Admissions Coordinator Note:  Patient was screened by Retta Diones for appropriateness for an Inpatient Acute Rehab Consult.  Noted PT recommending CIR.  At this time, we are recommending Inpatient Rehab consult.  Jodell Cipro M 08/07/2014, 5:04 PM  I can be reached at [phone].

## 2014-08-07 NOTE — Progress Notes (Signed)
    Subjective:  Denies CP or dyspnea; weak   Objective:  Filed Vitals:   08/06/14 1558 08/06/14 1746 08/06/14 1955 08/07/14 0426  BP: 91/53 107/55 117/63 99/56  Pulse:   94 92  Temp:   98 F (36.7 C) 97.7 F (36.5 C)  TempSrc:   Oral Oral  Resp:   20 17  Height:      Weight:    211 lb 11.2 oz (96.026 kg)  SpO2:   97% 96%    Intake/Output from previous day:  Intake/Output Summary (Last 24 hours) at 08/07/14 8295 Last data filed at 08/07/14 0655  Gross per 24 hour  Intake    959 ml  Output    880 ml  Net     79 ml    Physical Exam: Physical exam: Well-developed frail in no acute distress.  Skin is warm and dry.  HEENT is normal.  Neck is supple.  Chest is clear to auscultation with normal expansion. Pacemaker site with no hematoma Cardiovascular exam is regular rate and rhythm. 2/6 systolic murmur Abdominal exam nontender or distended. No masses palpated. Extremities wrapped and show 4+ edema. neuro grossly intact    Lab Results: Basic Metabolic Panel:  Recent Labs  08/05/14 0445 08/07/14 0533  NA 135* 133*  K 4.6 3.9  CL 97 93*  CO2 25 27  GLUCOSE 102* 110*  BUN 66* 66*  CREATININE 3.36* 3.44*  CALCIUM 8.2* 8.2*   CBC:  Recent Labs  08/05/14 0445 08/06/14 0505  WBC 11.4* 11.0*  HGB 9.4* 9.1*  HCT 29.6* 29.3*  MCV 105.0* 103.2*  PLT 162 CONSISTENT WITH PREVIOUS RESULT     Assessment/Plan:  1 status post aortic root replacement, aortic valve replacement, mitral valve replacement, coronary artery bypassing graft and maze-Continue rehab. 2 acute on chronic kidney failure-nephrology following. Significantly volume overloaded. Diuresis per nephrology. Discussed with Dr Florene Glen; may need dialysis. 3 thrombocytopenia-improved.  4 paroxysmal atrial fibrillation-patient is in AV paced rhythm today. Continue amiodarone and Coumadin. 5 CAD-continue aspirin and statin. 6 status post pacemaker 7 endocarditis-continue antibiotics; will need fu with  ID 8 Volume excess/right heart failure-co-ox 60; milrinone added; CHF team following.  Kirk Ruths 08/07/2014, 8:22 AM

## 2014-08-07 NOTE — Progress Notes (Signed)
Renal function and volume status worse.  Will transfer to [facility] for more invasive monitoring with CVP and co-ox.  Daniel Bensimhon,MD 8:45 AM

## 2014-08-07 NOTE — Progress Notes (Signed)
08/07/2014 0925 Attempted again to call report to [place]. RN to return call.  Susan Bleich, Arville Lime

## 2014-08-07 NOTE — Progress Notes (Signed)
SW received handoff from previous SW who reported that pt is from [facility] independent living. Upon discharge pt will be accepted into the SNF portion of [facility]. Pt can be transported by [facility] with adequate notice.   Charlene Brooke, MSW Clinical Social Worker [phone]

## 2014-08-07 NOTE — Progress Notes (Signed)
   Co-ox up to 62%. Cr improved to 2.6 on milrinone and dopamine.   CVP 15 with prominent CV waves.   However u/o remains sluggish on lasix 160 q6   Will give dose of metolazone 5.   Benay Spice 5:44 PM

## 2014-08-07 NOTE — Progress Notes (Signed)
Physical Therapy Treatment Patient Details Name: Justin [name] MRN: [id number] DOB: 31-Jan-1933 Today's Date: 08/07/2014    History of Present Illness Pt is a 80 y.o. male with history of CAD status post stents, hypertension, dyslipidemia, a-fib, pacemaker, ITP currently being treated with IVIG, who presents to the emergency department with complaints of fever and shortness of breath. 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 Pt underwent pacer placement on 8/28.    PT Comments    Pt admitted with above. Pt currently with functional limitations due to poor balance and endurance deficits with new onset incr fluid.  Given that medical status has changed, pt may benefit from Rehab consult to maximize progress.  Asked for consult.  Pt will benefit from skilled PT to increase their independence and safety with mobility to allow discharge to the venue listed below.   Follow Up Recommendations  CIR     Equipment Recommendations  Other (comment) (rollator)    Recommendations for Other Services       Precautions / Restrictions Precautions Precautions: Sternal;Fall;ICD/Pacemaker Restrictions Weight Bearing Restrictions: No    Mobility  Bed Mobility Overal bed mobility: Needs Assistance Bed Mobility: Supine to Sit     Supine to sit: Min assist     General bed mobility comments: Pt very swollen today and needed incr time and some assist to come to EOB.    Transfers Overall transfer level: Needs assistance Equipment used: 1 person hand held assist Transfers: Sit to/from [facility] Sit to Stand: Min assist;From elevated surface Stand pivot transfers: Min assist       General transfer comment: Verbal cues for hand placement and assist to bring up hips.  Pt able to pivot and weight shift to turn and get into recliner.    Ambulation/Gait                 Stairs            Wheelchair  Mobility    Modified Rankin (Stroke Patients Only)       Balance Overall balance assessment: Needs assistance;History of Falls Sitting-balance support: Feet supported;No upper extremity supported Sitting balance-Leahy Scale: Fair     Standing balance support: Single extremity supported;During functional activity Standing balance-Leahy Scale: Poor Standing balance comment: Needed bil UE support                    Cognition Arousal/Alertness: Awake/alert Behavior During Therapy: WFL for tasks assessed/performed Overall Cognitive Status: Within Functional Limits for tasks assessed                      Exercises General Exercises - Lower Extremity Ankle Circles/Pumps: AROM;Both;10 reps;Seated Long Arc Quad: AROM;Seated;Both    General Comments General comments (skin integrity, edema, etc.): UE and LEs edematous and weeping of skin noted.        Pertinent Vitals/Pain Pain Assessment: No/denies pain VSS    Home Living                      Prior Function            PT Goals (current goals can now be found in the care plan section) Progress towards PT goals: Progressing toward goals    Frequency  Min 2X/week    PT Plan Discharge plan needs to be updated    Co-evaluation  End of Session Equipment Utilized During Treatment: Gait belt Activity Tolerance: Patient limited by fatigue Patient left: in chair;with call bell/phone within reach     Time: 1457-1512 PT Time Calculation (min): 15 min  Charges:  $Therapeutic Activity: 8-22 mins                    G Codes:      INGOLD,Xitlaly Ault 08/20/2014, 4:27 PM [facility] Acute Rehabilitation [phone] (pager)

## 2014-08-07 NOTE — Progress Notes (Signed)
Pt declined CPAP this evening (he is sitting up in the chair, and stated that he "sleeps upright that he doesn't need it"). Explained that he can request it from me or the RN if/when he changes his mind.

## 2014-08-07 NOTE — Progress Notes (Signed)
08/07/2014 0900 Attempted to call report to Angelina. Rn to return call. Myndi Wamble, Arville Lime

## 2014-08-07 NOTE — Progress Notes (Signed)
Patient ambulated 150 feet on room air using front wheel walker with RN tolerated well.

## 2014-08-07 NOTE — Progress Notes (Signed)
08/07/2014 0950 Report given to [facility] RN. Questions and concerns addressed. Pt. Updated on plan to move to SDU.  Justin [name], Arville Lime

## 2014-08-07 NOTE — Progress Notes (Signed)
Bladder scanned per Dr. Clayborne Dana request. 19ml found during scan. Will continue to monitor.

## 2014-08-08 LAB — CBC
HEMATOCRIT: 28.4 % — AB (ref 39.0–52.0)
HEMOGLOBIN: 9 g/dL — AB (ref 13.0–17.0)
MCH: 32.7 pg (ref 26.0–34.0)
MCHC: 31.7 g/dL (ref 30.0–36.0)
MCV: 103.3 fL — ABNORMAL HIGH (ref 78.0–100.0)
Platelets: 112 10*3/uL — ABNORMAL LOW (ref 150–400)
RBC: 2.75 MIL/uL — ABNORMAL LOW (ref 4.22–5.81)
RDW: 21.7 % — ABNORMAL HIGH (ref 11.5–15.5)
WBC: 10.5 10*3/uL (ref 4.0–10.5)

## 2014-08-08 LAB — CARBOXYHEMOGLOBIN
Carboxyhemoglobin: 2.4 % — ABNORMAL HIGH (ref 0.5–1.5)
Methemoglobin: 0.7 % (ref 0.0–1.5)
O2 SAT: 62.8 %
Total hemoglobin: 10.5 g/dL — ABNORMAL LOW (ref 13.5–18.0)

## 2014-08-08 LAB — BASIC METABOLIC PANEL
Anion gap: 15 (ref 5–15)
Anion gap: 19 — ABNORMAL HIGH (ref 5–15)
BUN: 68 mg/dL — ABNORMAL HIGH (ref 6–23)
BUN: 64 mg/dL — ABNORMAL HIGH (ref 6–23)
CALCIUM: 7.9 mg/dL — AB (ref 8.4–10.5)
CHLORIDE: 86 meq/L — AB (ref 96–112)
CO2: 26 meq/L (ref 19–32)
CO2: 22 mEq/L (ref 19–32)
CREATININE: 2.27 mg/dL — AB (ref 0.50–1.35)
Calcium: 7.5 mg/dL — ABNORMAL LOW (ref 8.4–10.5)
Chloride: 83 mEq/L — ABNORMAL LOW (ref 96–112)
Creatinine, Ser: 2.37 mg/dL — ABNORMAL HIGH (ref 0.50–1.35)
GFR calc Af Amer: 30 mL/min — ABNORMAL LOW (ref >90)
GFR calc Af Amer: 28 mL/min — ABNORMAL LOW (ref >90)
GFR calc non Af Amer: 26 mL/min — ABNORMAL LOW (ref >90)
GFR calc non Af Amer: 24 mL/min — ABNORMAL LOW (ref >90)
GLUCOSE: 224 mg/dL — AB (ref 70–99)
Glucose, Bld: 380 mg/dL — ABNORMAL HIGH (ref 70–99)
Potassium: 3.5 mEq/L — ABNORMAL LOW (ref 3.7–5.3)
Potassium: 3.6 mEq/L — ABNORMAL LOW (ref 3.7–5.3)
Sodium: 127 mEq/L — ABNORMAL LOW (ref 137–147)
Sodium: 124 mEq/L — ABNORMAL LOW (ref 137–147)

## 2014-08-08 LAB — PROTIME-INR
INR: 1.8 — ABNORMAL HIGH (ref 0.00–1.49)
PROTHROMBIN TIME: 20.9 s — AB (ref 11.6–15.2)

## 2014-08-08 MED ORDER — NALOXONE HCL 1 MG/ML IJ SOLN: 0.8000 mg | INTRAMUSCULAR | Status: DC | PRN

## 2014-08-08 MED ORDER — POTASSIUM CHLORIDE CRYS ER 20 MEQ PO TBCR
40.0000 meq | EXTENDED_RELEASE_TABLET | Freq: Once | ORAL | Status: AC
  Administered 2014-08-08: 40 meq via ORAL
  Filled 2014-08-08: qty 2

## 2014-08-08 MED ORDER — FUROSEMIDE 10 MG/ML IJ SOLN
30.0000 mg/h | INTRAVENOUS | Status: DC
  Administered 2014-08-08 – 2014-08-12 (×10): 30 mg/h via INTRAVENOUS
  Filled 2014-08-08 (×22): qty 25

## 2014-08-08 MED ORDER — METOLAZONE 5 MG PO TABS
5.0000 mg | ORAL_TABLET | Freq: Once | ORAL | Status: AC
  Administered 2014-08-08: 5 mg via ORAL
  Filled 2014-08-08: qty 1

## 2014-08-08 MED ORDER — PROMETHAZINE HCL 25 MG/ML IJ SOLN
6.2500 mg | Freq: Three times a day (TID) | INTRAMUSCULAR | Status: DC | PRN
  Administered 2014-08-11: 6.25 mg via INTRAVENOUS
  Filled 2014-08-08: qty 1

## 2014-08-08 NOTE — Progress Notes (Signed)
Pt has refused cpap at this time.  Rt will continue to monitor.

## 2014-08-08 NOTE — Progress Notes (Signed)
Pt decided since he was going to lay in bed tonight he would like the cpap.  Rt will continue to monitor.

## 2014-08-08 NOTE — Progress Notes (Addendum)
      [address]       [place] [address]             [phone]      8 Days Post-Op Procedure(s) (LRB): PERMANENT PACEMAKER INSERTION (N/A)  Subjective:  [name] states he is worn out this morning.  He states he was up most of the night attempting to urinate, however he states he does not produce much urine.  He states he also was able to move his bowels this morning.  Finally he questions if he should be ambulating.  I encouraged the patient to ambulate at least 3 times per day.  Objective: Vital signs in last 24 hours: Temp:  [98 F (36.7 C)-98.4 F (36.9 C)] 98.1 F (36.7 C) (09/05 0405) Pulse Rate:  [91-104] 94 (09/05 0700) Cardiac Rhythm:  [-] Ventricular paced (09/04 2000) Resp:  [17-29] 28 (09/05 0700) BP: (90-244)/(43-218) 108/54 mmHg (09/05 0700) SpO2:  [91 %-100 %] 95 % (09/05 0700) Weight:  [214 lb 11.7 oz (97.4 kg)] 214 lb 11.7 oz (97.4 kg) (09/05 0410)  Hemodynamic parameters for last 24 hours: CVP:  [10 mmHg-13 mmHg] 13 mmHg  Intake/Output from previous day: 09/04 0701 - 09/05 0700 In: 2034 [P.O.:627; I.V.:893; IV [id number]:734] Out: 1937 [Urine:1350]  General appearance: alert, cooperative, no distress and appears winded this mroning Heart: regular rate and rhythm Lungs: clear to auscultation bilaterally Abdomen: soft, non-tender; bowel sounds normal; no masses,  no organomegaly Extremities: edema legs wrapped in ACE bandages, +edema on palpation Wound: clean and dry  Lab Results:  Recent Labs  08/06/14 0505 08/08/14 0453  WBC 11.0* 10.5  HGB 9.1* 9.0*  HCT 29.3* 28.4*  PLT CONSISTENT WITH PREVIOUS RESULT 112*   BMET:  Recent Labs  08/07/14 1135 08/07/14 1628  NA 135* 130*  K 3.8 3.8  CL 96 90*  CO2 25 25  GLUCOSE 118* 231*  BUN 62* 66*  CREATININE 3.20* 2.65*  CALCIUM 7.5* 7.9*    PT/INR:  Recent Labs  08/08/14 0453  LABPROT 20.9*  INR 1.80*   ABG    Component Value Date/Time   PHART 7.395 07/25/2014  2059   HCO3 19.5* 07/25/2014 2059   TCO2 20 07/25/2014 2059   ACIDBASEDEF 5.0* 07/25/2014 2059   O2SAT 62.8 08/08/2014 0503   CBG (last 3)  No results found for this basename: GLUCAP,  in the last 72 hours  Assessment/Plan: S/P Procedure(s) (LRB): PERMANENT PACEMAKER INSERTION (N/A)  1. CV- CHF, remains on Milrinone and Dopamine, Amiodarone 2. INR 1.80- continue Coumadin at 4 mg daily 3. Renal- creatinine is trending down, on Lasix, however patient states despite frequent urination, he is not producing much output, weight is 33 lbs above baseline, Nephrology following 4. ID- + Endocarditis, continue IV ABX completion date is 9/17 5. Dispo- continue care per HF team   LOS: 33 days    BARRETT, ERIN 08/08/2014  Despite very high doses of IV Lasix patient still has retained considerable fluid, anasarca Dialysis to begin--hopefully will be short-term Continue milrinone and low-dose dopamine Surgical incisions appear to be clean and dry patient examined and medical record reviewed,agree with above note. VAN TRIGT III,Kileen Lange 08/08/2014

## 2014-08-08 NOTE — Progress Notes (Signed)
CARDIAC REHAB PHASE I   PRE:  Rate/Rhythm: 93 pacing    BP: sitting 98/45, standing 88/50    SaO2: 100 RA  MODE:  Ambulation: 110 ft   POST:  Rate/Rhythm: 93 pacing    BP: sitting 104/59     SaO2: 100 RA  Pt willing to walk. Able to stand, BP standing only slightly lower, some lightheadedness noted. Pt SOB walking, weak. Sts he is "surprised how far he went". Return to recliner. Dyspneic. Will f/u. [phone]   Josephina Shih Hubbard CES, ACSM 08/08/2014 11:27 AM

## 2014-08-08 NOTE — Progress Notes (Signed)
Assessment/ Plan:   1. Non oliguric- Acute on CKD: Baseline Scr ~1.2. Urine output about one liter 2. Complete heart block: s/p PPM placement  3. Enterococcus endocarditis: s/p MVR, Aortic root replacement, CABG, ID following 4. Volume overload 5.  Hyponatremia Plan:  I will continue to watch but very suspicious that he will need dialysis due to lack of response to high dose diuretics. The low BP will represent a challenge potentially for dialysis.  Will reevaluate for need in AM  Subjective: Interval History: DOE  Objective: Vital signs in last 24 hours: Temp:  [98 F (36.7 C)-98.4 F (36.9 C)] 98.4 F (36.9 C) (09/05 1205) Pulse Rate:  [91-102] 94 (09/05 1205) Resp:  [17-28] 26 (09/05 1205) BP: (85-244)/(37-218) 140/65 mmHg (09/05 1205) SpO2:  [92 %-100 %] 99 % (09/05 1205) Weight:  [97.4 kg (214 lb 11.7 oz)] 97.4 kg (214 lb 11.7 oz) (09/05 0410) Weight change: 1.374 kg (3 lb 0.5 oz)  Intake/Output from previous day: 09/04 0701 - 09/05 0700 In: 2034 [P.O.:627; I.V.:893; IV Piggyback:514] Out: 1350 [Urine:1350] Intake/Output this shift: Total I/O In: -  Out: 276 [Urine:275; Stool:1]  GI: soft, non-tender; bowel sounds normal; no masses,  no organomegaly Extremities: edema 2+ Back with 2+ presacral  Lab Results:  Recent Labs  08/06/14 0505 08/08/14 0453  WBC 11.0* 10.5  HGB 9.1* 9.0*  HCT 29.3* 28.4*  PLT CONSISTENT WITH PREVIOUS RESULT 112*   BMET:  Recent Labs  08/07/14 1628 08/08/14 1044  NA 130* 127*  K 3.8 3.5*  CL 90* 86*  CO2 25 26  GLUCOSE 231* 224*  BUN 66* 68*  CREATININE 2.65* 2.27*  CALCIUM 7.9* 7.9*   No results found for this basename: PTH,  in the last 72 hours Iron Studies: No results found for this basename: IRON, TIBC, TRANSFERRIN, FERRITIN,  in the last 72 hours Studies/Results: No results found.  Scheduled: . amiodarone  200 mg Oral BID  . ampicillin (OMNIPEN) IV  2 g Intravenous 3 times per day  . antiseptic oral rinse  7 mL  Mouth Rinse QID  . aspirin EC  81 mg Oral Daily  . atorvastatin  10 mg Oral QPM  . cefTRIAXone (ROCEPHIN)  IV  2 g Intravenous Q12H  . chlorhexidine  15 mL Mouth Rinse BID  . enoxaparin (LOVENOX) injection  30 mg Subcutaneous Q24H  . feeding supplement (ENSURE COMPLETE)  237 mL Oral BID BM  . folic acid  2 mg Oral Daily  . furosemide  160 mg Intravenous Q6H  . pantoprazole  40 mg Oral Daily  . pyridOXINE  100 mg Oral QPM  . sodium chloride  10-40 mL Intracatheter Q12H  . vitamin B-12  1,000 mcg Oral QPM  . vitamin E  400 Units Oral Daily  . warfarin  4 mg Oral q1800  . Warfarin - Physician Dosing Inpatient   Does not apply q1800     LOS: 33 days   Sarh Kirschenbaum C 08/08/2014,1:46 PM

## 2014-08-08 NOTE — Progress Notes (Signed)
    Subjective:   Feels weak. Says he is peeing more but u/o remains sluggish despite high-dose lasix.   Bladder scan last night < 200 cc  Renal function improving on milrinone and dopamine. Co-ox 63%  Weight continues to go up. Nearly 30 pounds up.    Objective:  Filed Vitals:   08/08/14 0739 08/08/14 0800 08/08/14 0900 08/08/14 0930  BP: 103/62 94/63 85/37  106/47  Pulse: 97 94 94 93  Temp: 98 F (36.7 C)     TempSrc: Oral     Resp: 23 21 18 20   Height:      Weight:      SpO2: 97% 100% 99% 100%    Intake/Output from previous day:  Intake/Output Summary (Last 24 hours) at 08/08/14 1053 Last data filed at 08/08/14 0900  Gross per 24 hour  Intake 2034.04 ml  Output   1626 ml  Net 408.04 ml    Physical Exam: Physical exam: CVP 11-12 Well-developed frail in no acute distress.  Skin is warm and dry.  HEENT is normal.  Neck is supple. JVP up Chest is clear to auscultation with normal expansion. Pacemaker site with no hematoma Cardiovascular exam is regular rate and rhythm. 2/6 systolic murmur Abdominal exam nontender or distended. No masses palpated. Extremities show 4+ edema. wrapped neuro grossly intact    Lab Results: Basic Metabolic Panel:  Recent Labs  08/07/14 1135 08/07/14 1628  NA 135* 130*  K 3.8 3.8  CL 96 90*  CO2 25 25  GLUCOSE 118* 231*  BUN 62* 66*  CREATININE 3.20* 2.65*  CALCIUM 7.5* 7.9*  PHOS 4.1  --    CBC:  Recent Labs  08/06/14 0505 08/08/14 0453  WBC 11.0* 10.5  HGB 9.1* 9.0*  HCT 29.3* 28.4*  MCV 103.2* 103.3*  PLT CONSISTENT WITH PREVIOUS RESULT 112*     Assessment/Plan:  1 status post aortic root replacement, aortic valve replacement, mitral valve replacement, coronary artery bypassing graft and maze-Continue rehab. 2 acute on chronic kidney failure-nephrology following. Significantly volume overloaded. 3 thrombocytopenia-improved. Hematology following. 4 paroxysmal atrial fibrillation-patient is in AV paced rhythm  today. Continue amiodarone and Coumadin. 5 CAD-continue aspirin and statin. 6 status post pacemaker 7 endocarditis-continue antibiotics; will need fu with ID 8 Volume excess/right heart failure-  Renal function and co-ox improved on milrinone and dopamine but still not diuresing well on high-dose IV lasix. Renal following and considering HD.   He urinates only minimal amounts at each time and I am wondering if he may be limited some by BPH.   Will recheck BMET now. Give another dose of metolazone and place Foley to follow I/Os more closely.   Agree he may need HD.   Needs aggressive PT.   Daniel Bensimhon,MD 11:02 AM

## 2014-08-09 ENCOUNTER — Inpatient Hospital Stay (HOSPITAL_COMMUNITY): Payer: Medicare Other

## 2014-08-09 DIAGNOSIS — Z952 Presence of prosthetic heart valve: Secondary | ICD-10-CM

## 2014-08-09 LAB — BASIC METABOLIC PANEL
ANION GAP: 17 — AB (ref 5–15)
BUN: 65 mg/dL — ABNORMAL HIGH (ref 6–23)
CHLORIDE: 84 meq/L — AB (ref 96–112)
CO2: 25 meq/L (ref 19–32)
Calcium: 7.4 mg/dL — ABNORMAL LOW (ref 8.4–10.5)
Creatinine, Ser: 3.26 mg/dL — ABNORMAL HIGH (ref 0.50–1.35)
GFR calc non Af Amer: 17 mL/min — ABNORMAL LOW (ref >90)
GFR, EST AFRICAN AMERICAN: 19 mL/min — AB (ref >90)
Glucose, Bld: 404 mg/dL — ABNORMAL HIGH (ref 70–99)
Potassium: 3.4 mEq/L — ABNORMAL LOW (ref 3.7–5.3)
SODIUM: 126 meq/L — AB (ref 137–147)

## 2014-08-09 LAB — GLUCOSE, CAPILLARY
GLUCOSE-CAPILLARY: 154 mg/dL — AB (ref 70–99)
GLUCOSE-CAPILLARY: 128 mg/dL — AB (ref 70–99)
GLUCOSE-CAPILLARY: 136 mg/dL — AB (ref 70–99)
Glucose-Capillary: 146 mg/dL — ABNORMAL HIGH (ref 70–99)
Glucose-Capillary: 140 mg/dL — ABNORMAL HIGH (ref 70–99)
Glucose-Capillary: 117 mg/dL — ABNORMAL HIGH (ref 70–99)
Glucose-Capillary: 107 mg/dL — ABNORMAL HIGH (ref 70–99)

## 2014-08-09 LAB — CARBOXYHEMOGLOBIN
CARBOXYHEMOGLOBIN: 2.2 % — AB (ref 0.5–1.5)
Methemoglobin: 0.8 % (ref 0.0–1.5)
O2 SAT: 66.4 %
TOTAL HEMOGLOBIN: 11.4 g/dL — AB (ref 13.5–18.0)

## 2014-08-09 LAB — PROTIME-INR
INR: 1.96 — ABNORMAL HIGH (ref 0.00–1.49)
Prothrombin Time: 22.3 s — ABNORMAL HIGH (ref 11.6–15.2)

## 2014-08-09 MED ORDER — LEVALBUTEROL HCL 0.63 MG/3ML IN NEBU
INHALATION_SOLUTION | RESPIRATORY_TRACT | Status: AC
  Administered 2014-08-09: 22:00:00
  Filled 2014-08-09: qty 3

## 2014-08-09 MED ORDER — ALPRAZOLAM 0.25 MG PO TABS
0.2500 mg | ORAL_TABLET | Freq: Once | ORAL | Status: AC
  Administered 2014-08-09: 0.25 mg via ORAL
  Filled 2014-08-09: qty 1

## 2014-08-09 MED ORDER — SODIUM CHLORIDE 0.9 % IV SOLN
INTRAVENOUS | Status: DC
  Administered 2014-08-09: 0.9 [IU]/h via INTRAVENOUS
  Administered 2014-08-10: 2.6 [IU]/h via INTRAVENOUS
  Filled 2014-08-09 (×2): qty 2.5

## 2014-08-09 MED ORDER — DEXTROSE 50 % IV SOLN: 25.0000 mL | INTRAVENOUS | Status: DC | PRN

## 2014-08-09 MED ORDER — ZOLPIDEM TARTRATE 5 MG PO TABS: 5.0000 mg | ORAL_TABLET | Freq: Every evening | ORAL | Status: DC | PRN

## 2014-08-09 MED ORDER — SODIUM CHLORIDE 0.9 % IV SOLN: INTRAVENOUS | Status: DC

## 2014-08-09 MED ORDER — DIPHENHYDRAMINE HCL 25 MG PO CAPS: 25.0000 mg | ORAL_CAPSULE | Freq: Every evening | ORAL | Status: DC | PRN

## 2014-08-09 MED ORDER — INSULIN DETEMIR 100 UNIT/ML ~~LOC~~ SOLN
14.0000 [IU] | Freq: Two times a day (BID) | SUBCUTANEOUS | Status: DC
Start: 2014-08-10 — End: 2014-08-10
  Filled 2014-08-09: qty 0.14

## 2014-08-09 MED ORDER — INSULIN ASPART 100 UNIT/ML ~~LOC~~ SOLN
0.0000 [IU] | SUBCUTANEOUS | Status: DC
  Administered 2014-08-09: 2 [IU] via SUBCUTANEOUS

## 2014-08-09 MED ORDER — DEXTROSE-NACL 5-0.45 % IV SOLN
INTRAVENOUS | Status: DC
  Administered 2014-08-09: 15:00:00 via INTRAVENOUS

## 2014-08-09 MED ORDER — LEVALBUTEROL HCL 0.63 MG/3ML IN NEBU: 0.6300 mg | INHALATION_SOLUTION | Freq: Four times a day (QID) | RESPIRATORY_TRACT | Status: DC | PRN

## 2014-08-09 MED ORDER — CETYLPYRIDINIUM CHLORIDE 0.05 % MT LIQD
7.0000 mL | Freq: Two times a day (BID) | OROMUCOSAL | Status: DC
  Administered 2014-08-10 – 2014-08-31 (×42): 7 mL via OROMUCOSAL

## 2014-08-09 MED ORDER — INSULIN DETEMIR 100 UNIT/ML ~~LOC~~ SOLN
14.0000 [IU] | Freq: Once | SUBCUTANEOUS | Status: AC
  Administered 2014-08-09: 14 [IU] via SUBCUTANEOUS
  Filled 2014-08-09: qty 0.14

## 2014-08-09 MED ORDER — INSULIN REGULAR BOLUS VIA INFUSION
0.0000 [IU] | Freq: Three times a day (TID) | INTRAVENOUS | Status: DC
  Filled 2014-08-09: qty 10

## 2014-08-09 NOTE — Progress Notes (Signed)
Assessment/ Plan:   1. Non oliguric- Acute on CKD: Baseline Scr ~1.2. Urine output >3 liters but net zero 2. Complete heart block: s/p PPM placement  3. Enterococcus endocarditis: s/p MVR, Aortic root replacement, CABG,  4. Volume overload 5. Hyponatremia Plan: He has failed medical therapy and we will request a tunneled catheter for dialysis.  Subjective: Interval History: No net neg fluid off depite high dose furosemide via drip  Objective: Vital signs in last 24 hours: Temp:  [97.9 F (36.6 C)-98.6 F (37 C)] 97.9 F (36.6 C) (09/06 1200) Pulse Rate:  [74-96] 91 (09/06 1200) Resp:  [14-31] 18 (09/06 1200) BP: (83-155)/(41-116) 126/57 mmHg (09/06 1200) SpO2:  [92 %-100 %] 95 % (09/06 1200) Weight:  [97.387 kg (214 lb 11.2 oz)] 97.387 kg (214 lb 11.2 oz) (09/06 0300) Weight change: -0.013 kg (-0.5 oz)  Intake/Output from previous day: 09/05 0701 - 09/06 0700 In: 3113.1 [P.O.:1960; I.V.:837.1; IV Piggyback:316] Out: 3166 [Urine:3165; Stool:1] Intake/Output this shift: Total I/O In: 243.4 [I.V.:243.4] Out: 725 [Urine:725]  General appearance: alert Resp: diminished breath sounds bibasilar Cardio: irregularly irregular rhythm Extremities: edema 2-3+  Lab Results:  Recent Labs  08/08/14 0453  WBC 10.5  HGB 9.0*  HCT 28.4*  PLT 112*   BMET:  Recent Labs  08/08/14 1800 08/09/14 0950  NA 124* 126*  K 3.6* 3.4*  CL 83* 84*  CO2 22 25  GLUCOSE 380* 404*  BUN 64* 65*  CREATININE 2.37* 3.26*  CALCIUM 7.5* 7.4*   No results found for this basename: PTH,  in the last 72 hours Iron Studies: No results found for this basename: IRON, TIBC, TRANSFERRIN, FERRITIN,  in the last 72 hours Studies/Results: Dg Chest Port 1 View  08/09/2014   CLINICAL DATA:  Pulmonary edema, pleural effusions  EXAM: PORTABLE CHEST - 1 VIEW  COMPARISON:  08/03/2014; 08/02/2014; 07/28/2014  FINDINGS: Grossly unchanged cardiac silhouette S check grossly unchanged enlarged cardiac silhouette and  mediastinal contours post median sternotomy, CABG and aortic valve replacement. Stable positioning of support apparatus. Previously noted tiny biapical pneumothoraces are no longer demonstrated. Pulmonary vasculature remains indistinct with cephalization of flow. Suspected minimal increase in size of small layering effusions with associated worsening bilateral mid and lower lung opacities, likely atelectasis. Unchanged bones.  IMPRESSION: 1. No definite pneumothorax. 2. Findings suggestive of mild worsening of pulmonary edema with interval increase in size of layering bilateral effusions and associated worsening bibasilar atelectasis.   Electronically Signed   By: Sandi Mariscal M.D.   On: 08/09/2014 07:22    Scheduled: . amiodarone  200 mg Oral BID  . ampicillin (OMNIPEN) IV  2 g Intravenous 3 times per day  . antiseptic oral rinse  7 mL Mouth Rinse QID  . aspirin EC  81 mg Oral Daily  . atorvastatin  10 mg Oral QPM  . cefTRIAXone (ROCEPHIN)  IV  2 g Intravenous Q12H  . chlorhexidine  15 mL Mouth Rinse BID  . feeding supplement (ENSURE COMPLETE)  237 mL Oral BID BM  . folic acid  2 mg Oral Daily  . insulin regular  0-10 Units Intravenous TID WC  . pantoprazole  40 mg Oral Daily  . pyridOXINE  100 mg Oral QPM  . sodium chloride  10-40 mL Intracatheter Q12H  . vitamin B-12  1,000 mcg Oral QPM  . vitamin E  400 Units Oral Daily  . warfarin  4 mg Oral q1800  . Warfarin - Physician Dosing Inpatient   Does not apply  q1800       LOS: 34 days   Justin [name] C 08/09/2014,1:07 PM

## 2014-08-09 NOTE — Progress Notes (Signed)
Pt refused cpap at this time.  Rt will continue to monitor.  Pt states he wasn't able to tolerate

## 2014-08-09 NOTE — Progress Notes (Signed)
The 1800 order of coumadin is to be held per MD. Order. Pharmacy notified.

## 2014-08-09 NOTE — Progress Notes (Signed)
Corbin Ade RN given report from Rosebud Poles RN.

## 2014-08-09 NOTE — Progress Notes (Signed)
    Subjective:   Switched to lasix gtt yesterday. Urine output picked up on milrinone and dopamine. But dopamine stopped last night as SBP was at goal. (??) Over 3L urine out yesterday but intake also > 3L so weight stable.  Co-ox 66%  Weight up nearly 30 pounds total. Creatinine back up to 3.3 today. Very frustrated.    Objective:  Filed Vitals:   08/09/14 0645 08/09/14 0700 08/09/14 0800 08/09/14 0900  BP: 124/54 103/60 107/90 83/61  Pulse: 82 88 85 91  Temp:      TempSrc:      Resp: 31 25 25 24   Height:      Weight:      SpO2: 96% 94% 95% 96%    Intake/Output from previous day:  Intake/Output Summary (Last 24 hours) at 08/09/14 0950 Last data filed at 08/09/14 0910  Gross per 24 hour  Intake 2863.09 ml  Output   3240 ml  Net -376.91 ml    Physical Exam: Physical exam: CVP 15 Well-developed frail in no acute distress.  Skin is warm and dry.  HEENT is normal.  Neck is supple. JVP up Chest is clear to auscultation with normal expansion. Pacemaker site with no hematoma Cardiovascular exam is regular rate and rhythm. 2/6 systolic murmur Abdominal exam nontender or distended. No masses palpated. Extremities show 3+ edema into thigh. LEs wrapped neuro grossly intact    Lab Results: Basic Metabolic Panel:  Recent Labs  08/07/14 1135  08/08/14 1044 08/08/14 1800  NA 135*  < > 127* 124*  K 3.8  < > 3.5* 3.6*  CL 96  < > 86* 83*  CO2 25  < > 26 22  GLUCOSE 118*  < > 224* 380*  BUN 62*  < > 68* 64*  CREATININE 3.20*  < > 2.27* 2.37*  CALCIUM 7.5*  < > 7.9* 7.5*  PHOS 4.1  --   --   --   < > = values in this interval not displayed. CBC:  Recent Labs  08/08/14 0453  WBC 10.5  HGB 9.0*  HCT 28.4*  MCV 103.3*  PLT 112*     Assessment/Plan:  1 status post aortic root replacement, aortic valve replacement, mitral valve replacement, coronary artery bypassing graft and maze-Continue rehab. 2 acute on chronic kidney failure-nephrology following.  Significantly volume overloaded. 3 thrombocytopenia-improved. Hematology following. 4 paroxysmal atrial fibrillation-patient is in AV paced rhythm today. Continue amiodarone and Coumadin. 5 CAD-continue aspirin and statin. 6 status post pacemaker 7 endocarditis-continue antibiotics; will need fu with ID 8 Volume excess/right heart failure-  Urine output improved on high dose lasix, dopamine and milrinone but remains markedly edematous and renal function worse. I think he needs CVVHD now. Dr. Florene Glen following.   Glucose very high. Likely needs glucomander.   Teneisha Gignac,MD 9:50 AM

## 2014-08-09 NOTE — Progress Notes (Signed)
Discussed with PA that glucose is trending up and claimed to discuss with surgeon.

## 2014-08-09 NOTE — Progress Notes (Addendum)
      [address]       [place] [address]             [phone]      9 Days Post-Op Procedure(s) (LRB): PERMANENT PACEMAKER INSERTION (N/A)  Subjective:  Justin [name] is very discouraged this morning.  He states he feels like he is not making any progress and he has been in the hospital a long time.    Objective: Vital signs in last 24 hours: Temp:  [98 F (36.7 C)-98.6 F (37 C)] 98.2 F (36.8 C) (09/06 0300) Pulse Rate:  [82-96] 91 (09/06 0900) Cardiac Rhythm:  [-] Ventricular paced (09/06 0800) Resp:  [17-31] 24 (09/06 0900) BP: (83-155)/(45-116) 83/61 mmHg (09/06 0900) SpO2:  [94 %-100 %] 96 % (09/06 0900) Weight:  [214 lb 11.2 oz (97.387 kg)] 214 lb 11.2 oz (97.387 kg) (09/06 0300)  Hemodynamic parameters for last 24 hours: CVP:  [9 mmHg-17 mmHg] 9 mmHg  Intake/Output from previous day: 09/05 0701 - 09/06 0700 In: 3113.1 [P.O.:1960; I.V.:837.1; IV Piggyback:316] Out: 3570 [Urine:3165; Stool:1] Intake/Output this shift: Total I/O In: 10 [I.V.:10] Out: 350 [Urine:350]  General appearance: alert, cooperative and no distress Heart: regular rate and rhythm Lungs: clear to auscultation bilaterally Abdomen: soft, non-tender; bowel sounds normal; no masses,  no organomegaly Extremities: edema 3-4+ pitting Wound: clean and dry  Lab Results:  Recent Labs  08/08/14 0453  WBC 10.5  HGB 9.0*  HCT 28.4*  PLT 112*   BMET:  Recent Labs  08/08/14 1044 08/08/14 1800  NA 127* 124*  K 3.5* 3.6*  CL 86* 83*  CO2 26 22  GLUCOSE 224* 380*  BUN 68* 64*  CREATININE 2.27* 2.37*  CALCIUM 7.9* 7.5*    PT/INR:  Recent Labs  08/09/14 0457  LABPROT 22.3*  INR 1.96*   ABG    Component Value Date/Time   PHART 7.395 07/25/2014 2059   HCO3 19.5* 07/25/2014 2059   TCO2 20 07/25/2014 2059   ACIDBASEDEF 5.0* 07/25/2014 2059   O2SAT 66.4 08/09/2014 0513   CBG (last 3)  No results found for this basename: GLUCAP,  in the last 72  hours  Assessment/Plan: S/P Procedure(s) (LRB): PERMANENT PACEMAKER INSERTION (N/A)  1. CHF- remains on Milrinone and Amiodarone 2. INR 1.98, continue coumadin at 4 mg daily, can likely d/c Lovenox in AM 3. Pulm- CXR with worsening pulmonary edema from previous studies, no evidence of significant pleural effusions 4. Renal- significant Hypervolemia, on Lasix and Zaroxolyn without much improvement in volume status, Nephrology following 5. ID- Endocarditis on IV ABX with completion date of 9/17 6. Dispo- continue current care per HF, Nephrology   LOS: 34 days    BARRETT, ERIN 08/09/2014  Patient currently resting comfortably with stable vital signs Urine output is good but on very high doses of Lasix every 6 hours Renal dose dopamine and low-dose milrinone continue with CVP less than 15 Creatinine today is back up to 3.3 and patient still has severe anasarca Anticoagulation satisfactory for [facility] [place] procedure Patient would probably benefit from CVVH Blood sugar >>300 and we'll initiate IV insulin glucose stabilizer  patient examined and medical record reviewed,agree with above note. VAN TRIGT III,Kristiane Morsch 08/09/2014

## 2014-08-10 ENCOUNTER — Inpatient Hospital Stay (HOSPITAL_COMMUNITY): Payer: Medicare Other

## 2014-08-10 DIAGNOSIS — Z9889 Other specified postprocedural states: Secondary | ICD-10-CM

## 2014-08-10 LAB — GLUCOSE, CAPILLARY
GLUCOSE-CAPILLARY: 58 mg/dL — AB (ref 70–99)
GLUCOSE-CAPILLARY: 210 mg/dL — AB (ref 70–99)
GLUCOSE-CAPILLARY: 107 mg/dL — AB (ref 70–99)
Glucose-Capillary: 315 mg/dL — ABNORMAL HIGH (ref 70–99)
Glucose-Capillary: 67 mg/dL — ABNORMAL LOW (ref 70–99)
Glucose-Capillary: 114 mg/dL — ABNORMAL HIGH (ref 70–99)
Glucose-Capillary: 86 mg/dL (ref 70–99)
Glucose-Capillary: 158 mg/dL — ABNORMAL HIGH (ref 70–99)
Glucose-Capillary: 164 mg/dL — ABNORMAL HIGH (ref 70–99)

## 2014-08-10 LAB — CBC
HCT: 25.5 % — ABNORMAL LOW (ref 39.0–52.0)
HCT: 27.3 % — ABNORMAL LOW (ref 39.0–52.0)
HEMOGLOBIN: 8.8 g/dL — AB (ref 13.0–17.0)
Hemoglobin: 8.1 g/dL — ABNORMAL LOW (ref 13.0–17.0)
MCH: 32.7 pg (ref 26.0–34.0)
MCH: 32.7 pg (ref 26.0–34.0)
MCHC: 31.8 g/dL (ref 30.0–36.0)
MCHC: 32.2 g/dL (ref 30.0–36.0)
MCV: 102.8 fL — ABNORMAL HIGH (ref 78.0–100.0)
MCV: 101.5 fL — ABNORMAL HIGH (ref 78.0–100.0)
PLATELETS: 37 10*3/uL — AB (ref 150–400)
Platelets: 46 10*3/uL — ABNORMAL LOW (ref 150–400)
RBC: 2.48 MIL/uL — ABNORMAL LOW (ref 4.22–5.81)
RBC: 2.69 MIL/uL — AB (ref 4.22–5.81)
RDW: 20.8 % — ABNORMAL HIGH (ref 11.5–15.5)
RDW: 20.8 % — ABNORMAL HIGH (ref 11.5–15.5)
WBC: 9.8 10*3/uL (ref 4.0–10.5)
WBC: 8.2 10*3/uL (ref 4.0–10.5)

## 2014-08-10 LAB — COMPREHENSIVE METABOLIC PANEL
ALT: 25 U/L (ref 0–53)
AST: 54 U/L — ABNORMAL HIGH (ref 0–37)
Albumin: 2 g/dL — ABNORMAL LOW (ref 3.5–5.2)
Alkaline Phosphatase: 136 U/L — ABNORMAL HIGH (ref 39–117)
Anion gap: 14 (ref 5–15)
BUN: 67 mg/dL — ABNORMAL HIGH (ref 6–23)
CO2: 28 mEq/L (ref 19–32)
Calcium: 7.8 mg/dL — ABNORMAL LOW (ref 8.4–10.5)
Chloride: 82 mEq/L — ABNORMAL LOW (ref 96–112)
Creatinine, Ser: 1.44 mg/dL — ABNORMAL HIGH (ref 0.50–1.35)
GFR calc Af Amer: 52 mL/min — ABNORMAL LOW (ref >90)
GFR calc non Af Amer: 45 mL/min — ABNORMAL LOW (ref >90)
Glucose, Bld: 307 mg/dL — ABNORMAL HIGH (ref 70–99)
Potassium: 3 mEq/L — ABNORMAL LOW (ref 3.7–5.3)
Sodium: 124 mEq/L — ABNORMAL LOW (ref 137–147)
Total Bilirubin: 0.3 mg/dL (ref 0.3–1.2)
Total Protein: 5.8 g/dL — ABNORMAL LOW (ref 6.0–8.3)

## 2014-08-10 LAB — BASIC METABOLIC PANEL
ANION GAP: 17 — AB (ref 5–15)
BUN: 71 mg/dL — ABNORMAL HIGH (ref 6–23)
CHLORIDE: 86 meq/L — AB (ref 96–112)
CO2: 29 mEq/L (ref 19–32)
Calcium: 8 mg/dL — ABNORMAL LOW (ref 8.4–10.5)
Creatinine, Ser: 3.38 mg/dL — ABNORMAL HIGH (ref 0.50–1.35)
GFR, EST AFRICAN AMERICAN: 19 mL/min — AB (ref >90)
GFR, EST NON AFRICAN AMERICAN: 16 mL/min — AB (ref >90)
Glucose, Bld: 88 mg/dL (ref 70–99)
Potassium: 3.3 mEq/L — ABNORMAL LOW (ref 3.7–5.3)
SODIUM: 132 meq/L — AB (ref 137–147)

## 2014-08-10 LAB — IRON AND TIBC
Iron: 47 ug/dL (ref 42–135)
Saturation Ratios: 21 % (ref 20–55)
TIBC: 219 ug/dL (ref 215–435)
UIBC: 172 ug/dL (ref 125–400)

## 2014-08-10 LAB — RETICULOCYTES
RBC.: 2.7 MIL/uL — ABNORMAL LOW (ref 4.22–5.81)
RETIC CT PCT: 7.1 % — AB (ref 0.4–3.1)
Retic Count, Absolute: 191.7 10*3/uL — ABNORMAL HIGH (ref 19.0–186.0)

## 2014-08-10 LAB — PROTIME-INR
INR: 2.1 — ABNORMAL HIGH (ref 0.00–1.49)
PROTHROMBIN TIME: 23.6 s — AB (ref 11.6–15.2)

## 2014-08-10 LAB — PREPARE RBC (CROSSMATCH)

## 2014-08-10 LAB — SAVE SMEAR

## 2014-08-10 LAB — FERRITIN: Ferritin: 1485 ng/mL — ABNORMAL HIGH (ref 22–322)

## 2014-08-10 MED ORDER — DEXTROSE 50 % IV SOLN
INTRAVENOUS | Status: DC
Start: 2014-08-10 — End: 2014-08-10
  Filled 2014-08-10: qty 50

## 2014-08-10 MED ORDER — FUROSEMIDE 10 MG/ML IJ SOLN
80.0000 mg | Freq: Once | INTRAMUSCULAR | Status: AC
  Administered 2014-08-10: 80 mg via INTRAVENOUS
  Filled 2014-08-10: qty 8

## 2014-08-10 MED ORDER — ROMIPLOSTIM 250 MCG ~~LOC~~ SOLR
4.0000 ug/kg | Freq: Once | SUBCUTANEOUS | Status: AC
  Administered 2014-08-10: 400 ug via SUBCUTANEOUS
  Filled 2014-08-10: qty 0.8

## 2014-08-10 MED ORDER — DARBEPOETIN ALFA-POLYSORBATE 100 MCG/0.5ML IJ SOLN
100.0000 ug | INTRAMUSCULAR | Status: DC
  Administered 2014-08-12: 100 ug via SUBCUTANEOUS
  Filled 2014-08-10 (×2): qty 0.5

## 2014-08-10 MED ORDER — SODIUM CHLORIDE 0.9 % IV SOLN: Freq: Once | INTRAVENOUS | Status: DC

## 2014-08-10 MED ORDER — INSULIN ASPART 100 UNIT/ML ~~LOC~~ SOLN
0.0000 [IU] | Freq: Every day | SUBCUTANEOUS | Status: DC
  Administered 2014-08-12: 2 [IU] via SUBCUTANEOUS

## 2014-08-10 MED ORDER — INSULIN ASPART 100 UNIT/ML ~~LOC~~ SOLN
0.0000 [IU] | Freq: Three times a day (TID) | SUBCUTANEOUS | Status: DC
  Administered 2014-08-10: 3 [IU] via SUBCUTANEOUS
  Administered 2014-08-10: 5 [IU] via SUBCUTANEOUS
  Administered 2014-08-11: 8 [IU] via SUBCUTANEOUS
  Administered 2014-08-12 – 2014-08-14 (×4): 2 [IU] via SUBCUTANEOUS
  Administered 2014-08-15: 5 [IU] via SUBCUTANEOUS
  Administered 2014-08-16: 3 [IU] via SUBCUTANEOUS
  Administered 2014-08-16: 2 [IU] via SUBCUTANEOUS
  Administered 2014-08-17 (×2): 3 [IU] via SUBCUTANEOUS
  Administered 2014-08-18: 2 [IU] via SUBCUTANEOUS
  Administered 2014-08-21: 5 [IU] via SUBCUTANEOUS
  Administered 2014-08-22 – 2014-08-23 (×3): 3 [IU] via SUBCUTANEOUS
  Administered 2014-08-23: 2 [IU] via SUBCUTANEOUS
  Administered 2014-08-24: 3 [IU] via SUBCUTANEOUS
  Administered 2014-08-24: 2 [IU] via SUBCUTANEOUS
  Administered 2014-08-25: 5 [IU] via SUBCUTANEOUS
  Administered 2014-08-25 – 2014-08-26 (×3): 3 [IU] via SUBCUTANEOUS
  Administered 2014-08-27: 2 [IU] via SUBCUTANEOUS
  Administered 2014-08-27 – 2014-08-28 (×3): 3 [IU] via SUBCUTANEOUS

## 2014-08-10 MED ORDER — METOLAZONE 5 MG PO TABS
5.0000 mg | ORAL_TABLET | Freq: Two times a day (BID) | ORAL | Status: DC
  Administered 2014-08-10 – 2014-08-11 (×4): 5 mg via ORAL
  Filled 2014-08-10 (×6): qty 1

## 2014-08-10 MED ORDER — POTASSIUM CHLORIDE 20 MEQ/15ML (10%) PO LIQD
40.0000 meq | Freq: Two times a day (BID) | ORAL | Status: DC
  Administered 2014-08-10 – 2014-08-11 (×4): 40 meq via ORAL
  Filled 2014-08-10 (×7): qty 30

## 2014-08-10 MED ORDER — IMMUNE GLOBULIN (HUMAN) 20 GM/200ML IV SOLN
400.0000 mg/kg | INTRAVENOUS | Status: DC
  Filled 2014-08-10: qty 400

## 2014-08-10 MED ORDER — IMMUNE GLOBULIN (HUMAN) 20 GM/200ML IV SOLN
400.0000 mg/kg | INTRAVENOUS | Status: AC
  Administered 2014-08-10 – 2014-08-14 (×5): 40 g via INTRAVENOUS
  Filled 2014-08-10 (×7): qty 400

## 2014-08-10 MED ORDER — ALTEPLASE 2 MG IJ SOLR
2.0000 mg | Freq: Once | INTRAMUSCULAR | Status: AC
  Administered 2014-08-10: 2 mg
  Filled 2014-08-10: qty 2

## 2014-08-10 NOTE — Progress Notes (Signed)
Justin [name] is back in the cardiac intensive care unit. He is on insulin drip. Lasix drip. He is on milrinone.  Unfortunately, his platelets are dropping quickly. Again, I'm not sure as to why this is a going on. This could be medication-wise. 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  His blood count was normal back on September 1. Then began to drop quickly. On the 5th, it was down to 112,000. Today it is down to 46,000. Want her to stay with a drop so quick, that we are looking at a relapse of the immune-based cytopenia. As such, one would have to wonder if his initial thrombocytopenia was immune-based and it was exacerbated by the endocarditis.  Hemoglobin is 8.1. I think that he will need to be transfused. I doubt that he is hemolyzing. I want to get a blood smear. I'll have to see if there is anything on the blood smear that might look suspicious. I would not think that he has any type of micro-angiopathic hemolysis.  His renal function looks better. His creatinine is 1.44 today.  He has a very low erythropoietin level. Again, I'm not sure when his last Aranesp was given. I think he probably needs to have another dose of Aranesp.  He looks very weak. His was not able to do all that much. His appetite is down., Show much physical therapy he is able to do right now.  His vital signs are pretty good. Blood pressure 115/58. Temperature 98.4. Pulse is 92.  His lungs are clear. Cardiac exam regular in rhythm. Abdomen is soft. Has good bowel sounds. There is no fluid wave. There is no palpable liver or spleen tip. Extremities shows wrapping around his lower legs. It seems as if he still has about 2-3+ edema.  We will have to do some additional studies on him. Again, these by going to need to be transfused. I think Aranesp will help but it may take a week or more before this happens.  We will pray hard for him. He's been in the hospital now for 6 weeks.  Pete E.

## 2014-08-10 NOTE — Progress Notes (Signed)
CRITICAL VALUE ALERT  Critical value received:  25  Date of notification:  08/10/14  Time of notification:0830   Critical value read back:yes  Nurse who received alert: PLS  MD notified (1st page):  Dr Louie Boston  Time of first page:  0900

## 2014-08-10 NOTE — Progress Notes (Signed)
    Subjective:   Urine output remains 3L per day on milrinone and dopamine but weight continues to go up. Cr up to 3.4. Co-ox 66%  Weight up nearly 30 pounds total. Very weak. Frustrated. CXR worse.   Platelets 112k-> 37k. No active bleeding   Objective:  Filed Vitals:   08/10/14 1000 08/10/14 1100 08/10/14 1200 08/10/14 1300  BP: 117/53 100/48 103/54 109/54  Pulse: 66 69 74 90  Temp:   98 F (36.7 C)   TempSrc:   Other (Comment)   Resp: 24 18 26 21   Height:      Weight:      SpO2: 100% 100% 100% 99%    Intake/Output from previous day:  Intake/Output Summary (Last 24 hours) at 08/10/14 1315 Last data filed at 08/10/14 1300  Gross per 24 hour  Intake 2061.2 ml  Output   2540 ml  Net -478.8 ml    Physical Exam: Physical exam: CVP 15-17 Well-developed frail in no acute distress.  Skin is warm and dry.  HEENT is normal.  Neck is supple. JVP up Chest is clear to auscultation with normal expansion. Pacemaker site with no hematoma Cardiovascular exam is regular rate and rhythm. 2/6 systolic murmur Abdominal exam nontender or distended. No masses palpated. Extremities show 3+ edema into thigh. LEs wrapped neuro grossly intact    Lab Results: Basic Metabolic Panel:  Recent Labs  08/10/14 0507 08/10/14 1025  NA 124* 132*  K 3.0* 3.3*  CL 82* 86*  CO2 28 29  GLUCOSE 307* 88  BUN 67* 71*  CREATININE 1.44* 3.38*  CALCIUM 7.8* 8.0*   CBC:  Recent Labs  08/10/14 0507 08/10/14 1025  WBC 9.8 8.2  HGB 8.1* 8.8*  HCT 25.5* 27.3*  MCV 102.8* 101.5*  PLT 46* 37*     Assessment/Plan:  1 status post aortic root replacement, aortic valve replacement, mitral valve replacement, coronary artery bypassing graft and maze-Continue rehab. 2 acute on chronic kidney failure-nephrology following. Significantly volume overloaded. 3 thrombocytopenia-improved. Hematology following. 4 paroxysmal atrial fibrillation-patient is in AV paced rhythm today. Continue amiodarone  and Coumadin. 5 CAD-continue aspirin and statin. 6 status post pacemaker 7 endocarditis-continue antibiotics; will need fu with ID 8 Volume excess/right heart failure-  Urine output improved on high dose lasix, dopamine and milrinone but remains markedly edematous and renal function worse. He will need CVVHD but platelets currently too low to place catheter. I spoke with Drs. Ennever and [employer].  Dr. Marin Olp planning to start IVIG today. Hopefully this will help platelets come up quickly.   Prognosis remains guarded.    Mabel Roll,MD 1:15 PM

## 2014-08-10 NOTE — Progress Notes (Signed)
Pt asked rt to remove cpap last night because he did not tolerate it well.  Rt will continue to monitor.

## 2014-08-10 NOTE — Progress Notes (Signed)
CSW met with patient this afternoon in his room. Patient was sitting up in a chair and was noted to be alert and oriented.  Patient stated "I have felt better but I'm ok".  CSW discussed with patient that Physical Therapy has recommended CIR referral.  Patient declines this option and wants to go to [facility]- SNF when medically stable as his wife is a resident in the independent section of [facility].  Patient was able to verbalize an understanding of CIR and it's benefits but remains adamant in declining this option.  CSW spoke with Candace at [facility]. She stated that the have a SNF bed waiting or patient when medically stable.  Fl2 will require updating prior to d/c.  Lorie Phenix. Pauline Good, [place]

## 2014-08-10 NOTE — Progress Notes (Signed)
Subjective:  Main complaint is of volume overload- creatinine improved dramatically- blood pressure better on dopamine and milrinone- made 3 liters of urine- platelets down more Objective Vital signs in last 24 hours: Filed Vitals:   08/10/14 0406 08/10/14 0500 08/10/14 0600 08/10/14 0700  BP: 128/60 128/61 109/52 115/58  Pulse: 89 86 87 92  Temp:      TempSrc:      Resp: 14 18 21 26   Height:      Weight:  99.519 kg (219 lb 6.4 oz)    SpO2: 97% 100% 98% 91%   Weight change: 2.132 kg (4 lb 11.2 oz)  Intake/Output Summary (Last 24 hours) at 08/10/14 0841 Last data filed at 08/10/14 0700  Gross per 24 hour  Intake 2525.8 ml  Output   2650 ml  Net -124.2 ml    Assessment/ Plan: Pt is a 80 y.o. yo male who was admitted on 07/22/2014 with A on CRF  s/p MVR with complications  Assessment/Plan: 1. Renal- so with better renal perfusion creatinine has improved, making 3 liters of urine on current therapy including one liter out since 4 AM- with this in mind as well as his low platelets  I would like to give it at least another day to see if we can make progress with diuresis the natural way before committing him to dialysis 2. HTN/vol- will add on zarxolyn  3. Anemia- will dose with aranesp- will not transfuse just yet 4. Hypokalemia- will replete 5. Hyponatremia- due to volume overload- will improve as volume status hopefully improves 6. Cardiac- on milrinone/dopamine- hemodynamics are improved- per cards  Briana Farner A    Labs: Basic Metabolic Panel:  Recent Labs Lab 08/04/14 0532  08/07/14 1135  08/08/14 1800 08/09/14 0950 08/10/14 0507  NA 138  < > 135*  < > 124* 126* 124*  K 4.9  < > 3.8  < > 3.6* 3.4* 3.0*  CL 99  < > 96  < > 83* 84* 82*  CO2 25  < > 25  < > 22 25 28   GLUCOSE 104*  < > 118*  < > 380* 404* 307*  BUN 67*  < > 62*  < > 64* 65* 67*  CREATININE 3.34*  < > 3.20*  < > 2.37* 3.26* 1.44*  CALCIUM 8.1*  < > 7.5*  < > 7.5* 7.4* 7.8*  PHOS 3.4  --  4.1   --   --   --   --   < > = values in this interval not displayed. Liver Function Tests:  Recent Labs Lab 08/05/14 0445 08/07/14 1135 08/10/14 0507  AST 76*  --  54*  ALT 27  --  25  ALKPHOS 179*  --  136*  BILITOT 0.3  --  0.3  PROT 5.8*  --  5.8*  ALBUMIN 2.0* 1.9* 2.0*   No results found for this basename: LIPASE, AMYLASE,  in the last 168 hours No results found for this basename: AMMONIA,  in the last 168 hours CBC:  Recent Labs Lab 08/04/14 0532 08/05/14 0445 08/06/14 0505 08/08/14 0453 08/10/14 0507  WBC 11.9* 11.4* 11.0* 10.5 9.8  HGB 9.5* 9.4* 9.1* 9.0* 8.1*  HCT 29.8* 29.6* 29.3* 28.4* 25.5*  MCV 102.1* 105.0* 103.2* 103.3* 102.8*  PLT 213 162 CONSISTENT WITH PREVIOUS RESULT 112* 46*   Cardiac Enzymes: No results found for this basename: CKTOTAL, CKMB, CKMBINDEX, TROPONINI,  in the last 168 hours CBG:  Recent Labs Lab 08/09/14 1852 08/09/14  2001 08/09/14 2258 08/10/14 0434 08/10/14 0508  GLUCAP 128* 136* 107* 67* 315*    Iron Studies: No results found for this basename: IRON, TIBC, TRANSFERRIN, FERRITIN,  in the last 72 hours Studies/Results: Dg Chest Port 1 View  08/09/2014   CLINICAL DATA:  Pulmonary edema, pleural effusions  EXAM: PORTABLE CHEST - 1 VIEW  COMPARISON:  08/03/2014; 08/02/2014; 07/12/2014  FINDINGS: Grossly unchanged cardiac silhouette S check grossly unchanged enlarged cardiac silhouette and mediastinal contours post median sternotomy, CABG and aortic valve replacement. Stable positioning of support apparatus. Previously noted tiny biapical pneumothoraces are no longer demonstrated. Pulmonary vasculature remains indistinct with cephalization of flow. Suspected minimal increase in size of small layering effusions with associated worsening bilateral mid and lower lung opacities, likely atelectasis. Unchanged bones.  IMPRESSION: 1. No definite pneumothorax. 2. Findings suggestive of mild worsening of pulmonary edema with interval increase in size  of layering bilateral effusions and associated worsening bibasilar atelectasis.   Electronically Signed   By: Sandi Mariscal M.D.   On: 08/09/2014 07:22   Medications: Infusions: . sodium chloride 5 mL/hr at 08/10/14 0100  . sodium chloride 10 mL/hr at 08/09/14 1309  . dextrose 5 % and 0.45% NaCl 10 mL/hr at 08/09/14 1454  . DOPamine 3 mcg/kg/min (08/10/14 0610)  . furosemide (LASIX) infusion 30 mg/hr (08/10/14 0607)  . insulin (NOVOLIN-R) infusion Stopped (08/10/14 0649)  . milrinone 0.25 mcg/kg/min (08/09/14 2005)    Scheduled Medications: . sodium chloride   Intravenous Once  . amiodarone  200 mg Oral BID  . ampicillin (OMNIPEN) IV  2 g Intravenous 3 times per day  . antiseptic oral rinse  7 mL Mouth Rinse BID  . aspirin EC  81 mg Oral Daily  . atorvastatin  10 mg Oral QPM  . cefTRIAXone (ROCEPHIN)  IV  2 g Intravenous Q12H  . feeding supplement (ENSURE COMPLETE)  237 mL Oral BID BM  . folic acid  2 mg Oral Daily  . furosemide  80 mg Intravenous Once  . insulin regular  0-10 Units Intravenous TID WC  . pantoprazole  40 mg Oral Daily  . pyridOXINE  100 mg Oral QPM  . sodium chloride  10-40 mL Intracatheter Q12H  . vitamin B-12  1,000 mcg Oral QPM  . vitamin E  400 Units Oral Daily  . warfarin  4 mg Oral q1800  . Warfarin - Physician Dosing Inpatient   Does not apply q1800    have reviewed scheduled and prn medications.  Physical Exam: General: alert, frustrated- low sugar this AM Heart: RRR Lungs: decreased BS at bases Abdomen: distended Extremities: pitting edema    08/10/2014,8:41 AM  LOS: 35 days

## 2014-08-10 NOTE — Progress Notes (Signed)
CBG taken from periphery was 67. 8oz of orange juice given. Retook CBG using central line. CBG 315. MD notified. Restarting glucostabalizer.

## 2014-08-10 NOTE — Progress Notes (Signed)
Unable to obtain blood from left picc line. IV team notified for TPA administration.

## 2014-08-10 NOTE — Progress Notes (Addendum)
      [address]       [place] [address]             [phone]      10 Days Post-Op Procedure(s) (LRB): PERMANENT PACEMAKER INSERTION (N/A)  Subjective:  [name] states he feels okay this morning.  He looks acute ill compared to yesterday.  Objective: Vital signs in last 24 hours: Temp:  [97.9 F (36.6 C)-98.8 F (37.1 C)] 98.4 F (36.9 C) (09/07 0400) Pulse Rate:  [74-106] 92 (09/07 0700) Cardiac Rhythm:  [-] Ventricular paced (09/07 0406) Resp:  [14-33] 26 (09/07 0700) BP: (83-137)/(41-107) 115/58 mmHg (09/07 0700) SpO2:  [91 %-100 %] 91 % (09/07 0700) Weight:  [219 lb 6.4 oz (99.519 kg)] 219 lb 6.4 oz (99.519 kg) (09/07 0500)  Hemodynamic parameters for last 24 hours: CVP:  [15 mmHg-18 mmHg] 18 mmHg  Intake/Output from previous day: 09/06 0701 - 09/07 0700 In: 2600 [P.O.:975; I.V.:1375; IV Piggyback:250] Out: 3000 [Urine:3000]  General appearance: alert, cooperative and pale Heart: regular rate and rhythm Lungs: diminished breath sounds bibasilar Abdomen: soft, non-tender; bowel sounds normal; no masses,  no organomegaly Extremities: edema 4+ Wound: clean and dry  Lab Results:  Recent Labs  08/08/14 0453 08/10/14 0507  WBC 10.5 9.8  HGB 9.0* 8.1*  HCT 28.4* 25.5*  PLT 112* 46*   BMET:  Recent Labs  08/09/14 0950 08/10/14 0507  NA 126* 124*  K 3.4* 3.0*  CL 84* 82*  CO2 25 28  GLUCOSE 404* 307*  BUN 65* 67*  CREATININE 3.26* 1.44*  CALCIUM 7.4* 7.8*    PT/INR:  Recent Labs  08/10/14 0507  LABPROT 23.6*  INR 2.10*   ABG    Component Value Date/Time   PHART 7.395 07/25/2014 2059   HCO3 19.5* 07/25/2014 2059   TCO2 20 07/25/2014 2059   ACIDBASEDEF 5.0* 07/25/2014 2059   O2SAT 66.4 08/09/2014 0513   CBG (last 3)   Recent Labs  08/09/14 2258 08/10/14 0434 08/10/14 0508  GLUCAP 107* 67* 315*    Assessment/Plan: S/P Procedure(s) (LRB): PERMANENT PACEMAKER INSERTION (N/A)  1. CV- CHF- on Milrinone, Dopamine,  advance HF failure monitoring 2. Pulm- CXR shows pulmonary edema, small bilateral effusions, wean oxygen as tolerated 3. Renal- creatinine has improved down to 1.44, remains significantly hypervolemic on Lasix gtt, Neprhology following, will likely start dialysis soon 4. Heme- Worsening Thrombocytopenia, count down to 46,000, Anemia with Hgb at 8.1- Hematology following 5. CBGs- elevated, patient being treated with glucomander, no h/o DM, last A1c was 5.4 6. ID- Endocarditis- continue IV ABX with stop date of 9/17 7. Dispo- patient remains discouraged, continue care per HF/Neprhology  LOS: 35 days    Justin [name] 08/10/2014 Early morning creatinine was in error. Patient is not uncomfortable but fairly frustrated with persistent severe edema, persistently elevated creatinine now 3.5 despite maximal medical therapy. Tunnel dialysis catheter is planned when platelet count is better-recommend scheduling Diatek  with preoperative platelet transfusion. Surgical incisions clean and dry patient with good appetite and adequate nutritional intake

## 2014-08-10 NOTE — Progress Notes (Signed)
Physical Therapy Treatment Patient Details Name: Justin [name] MRN: [id number] DOB: 1933-05-24 Today's Date: 08/10/2014    History of Present Illness Pt is a 80 y.o. male with history of CAD status post stents, hypertension, dyslipidemia, a-fib, pacemaker, ITP currently being treated with IVIG, who presents to the emergency department with complaints of fever and shortness of breath. 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 Pt underwent pacer placement on 8/28.    PT Comments    Pt indicates very fatigued today and moved slowly with activity.  Pt's O2 sats remained >/= 94% throughout mobility.  Spoke with RN about ordering pt a new Geomat seat cushion as pt used to have one for sitting in recliner when he was in a previous room.  Will continue to follow.    Follow Up Recommendations  CIR     Equipment Recommendations   (Rollator)    Recommendations for Other Services       Precautions / Restrictions Precautions Precautions: Sternal;Fall;ICD/Pacemaker Restrictions Weight Bearing Restrictions: No    Mobility  Bed Mobility Overal bed mobility: Needs Assistance Bed Mobility: Supine to Sit     Supine to sit: Mod assist     General bed mobility comments: With R LE and bringing trunk up to sitting.    Transfers Overall transfer level: Needs assistance Equipment used: 2 person hand held assist Transfers: Sit to/from [facility] Sit to Stand: Min assist;+2 physical assistance;+2 safety/equipment Stand pivot transfers: Min assist;+2 safety/equipment       General transfer comment: cues for rocking to use momentum and minimizing use of UEs.  2nd person A with line management.    Ambulation/Gait                 Stairs            Wheelchair Mobility    Modified Rankin (Stroke Patients Only)       Balance Overall balance assessment: Needs assistance         Standing  balance support: During functional activity Standing balance-Leahy Scale: Poor                      Cognition Arousal/Alertness: Awake/alert Behavior During Therapy: WFL for tasks assessed/performed Overall Cognitive Status: Within Functional Limits for tasks assessed                      Exercises      General Comments        Pertinent Vitals/Pain Pain Assessment: No/denies pain    Home Living                      Prior Function            PT Goals (current goals can now be found in the care plan section) Acute Rehab PT Goals PT Goal Formulation: Patient unable to participate in goal setting Time For Goal Achievement: 08/10/14 Potential to Achieve Goals: Good Progress towards PT goals: Progressing toward goals    Frequency  Min 2X/week    PT Plan Current plan remains appropriate    Co-evaluation             End of Session Equipment Utilized During Treatment: Gait belt;Oxygen Activity Tolerance: Patient limited by fatigue Patient left: in chair;with call bell/phone within reach     Time: 0962-8366 PT Time Calculation (min): 35 min  Charges:  $Therapeutic Activity:  23-37 mins                    G CodesCatarina Hartshorn, [place] 08/10/2014, 12:24 PM

## 2014-08-11 ENCOUNTER — Inpatient Hospital Stay (HOSPITAL_COMMUNITY): Payer: Medicare Other

## 2014-08-11 DIAGNOSIS — R7402 Elevation of levels of lactic acid dehydrogenase (LDH): Secondary | ICD-10-CM

## 2014-08-11 DIAGNOSIS — N179 Acute kidney failure, unspecified: Secondary | ICD-10-CM

## 2014-08-11 DIAGNOSIS — G4733 Obstructive sleep apnea (adult) (pediatric): Secondary | ICD-10-CM

## 2014-08-11 DIAGNOSIS — R6889 Other general symptoms and signs: Secondary | ICD-10-CM

## 2014-08-11 DIAGNOSIS — R740 Nonspecific elevation of levels of transaminase and lactic acid dehydrogenase [LDH]: Secondary | ICD-10-CM

## 2014-08-11 DIAGNOSIS — J9600 Acute respiratory failure, unspecified whether with hypoxia or hypercapnia: Secondary | ICD-10-CM

## 2014-08-11 DIAGNOSIS — R7401 Elevation of levels of liver transaminase levels: Secondary | ICD-10-CM

## 2014-08-11 LAB — RENAL FUNCTION PANEL
ALBUMIN: 2.2 g/dL — AB (ref 3.5–5.2)
Albumin: 2.1 g/dL — ABNORMAL LOW (ref 3.5–5.2)
Anion gap: 15 (ref 5–15)
Anion gap: 14 (ref 5–15)
BUN: 68 mg/dL — ABNORMAL HIGH (ref 6–23)
BUN: 66 mg/dL — AB (ref 6–23)
CALCIUM: 7.9 mg/dL — AB (ref 8.4–10.5)
CO2: 29 mEq/L (ref 19–32)
CO2: 30 mEq/L (ref 19–32)
CREATININE: 1.72 mg/dL — AB (ref 0.50–1.35)
CREATININE: 1.73 mg/dL — AB (ref 0.50–1.35)
Calcium: 7.9 mg/dL — ABNORMAL LOW (ref 8.4–10.5)
Chloride: 83 mEq/L — ABNORMAL LOW (ref 96–112)
Chloride: 84 mEq/L — ABNORMAL LOW (ref 96–112)
GFR calc Af Amer: 41 mL/min — ABNORMAL LOW (ref >90)
GFR calc non Af Amer: 36 mL/min — ABNORMAL LOW (ref >90)
GFR, EST AFRICAN AMERICAN: 42 mL/min — AB (ref >90)
GFR, EST NON AFRICAN AMERICAN: 36 mL/min — AB (ref >90)
Glucose, Bld: 247 mg/dL — ABNORMAL HIGH (ref 70–99)
Glucose, Bld: 274 mg/dL — ABNORMAL HIGH (ref 70–99)
POTASSIUM: 3.6 meq/L — AB (ref 3.7–5.3)
Phosphorus: 5.2 mg/dL — ABNORMAL HIGH (ref 2.3–4.6)
Phosphorus: 4.8 mg/dL — ABNORMAL HIGH (ref 2.3–4.6)
Potassium: 3.5 mEq/L — ABNORMAL LOW (ref 3.7–5.3)
Sodium: 127 mEq/L — ABNORMAL LOW (ref 137–147)
Sodium: 128 mEq/L — ABNORMAL LOW (ref 137–147)

## 2014-08-11 LAB — CBC
HCT: 31.4 % — ABNORMAL LOW (ref 39.0–52.0)
HEMOGLOBIN: 10.4 g/dL — AB (ref 13.0–17.0)
MCH: 32.2 pg (ref 26.0–34.0)
MCHC: 33.1 g/dL (ref 30.0–36.0)
MCV: 97.2 fL (ref 78.0–100.0)
PLATELETS: 29 10*3/uL — AB (ref 150–400)
RBC: 3.23 MIL/uL — AB (ref 4.22–5.81)
RDW: 23.8 % — ABNORMAL HIGH (ref 11.5–15.5)
WBC: 7.4 10*3/uL (ref 4.0–10.5)

## 2014-08-11 LAB — TYPE AND SCREEN
ABO/RH(D): A NEG
Antibody Screen: NEGATIVE
UNIT DIVISION: 0
Unit division: 0

## 2014-08-11 LAB — PROTIME-INR
INR: 1.75 — ABNORMAL HIGH (ref 0.00–1.49)
Prothrombin Time: 20.4 seconds — ABNORMAL HIGH (ref 11.6–15.2)

## 2014-08-11 LAB — GLUCOSE, CAPILLARY
GLUCOSE-CAPILLARY: 103 mg/dL — AB (ref 70–99)
GLUCOSE-CAPILLARY: 83 mg/dL (ref 70–99)
Glucose-Capillary: 111 mg/dL — ABNORMAL HIGH (ref 70–99)
Glucose-Capillary: 115 mg/dL — ABNORMAL HIGH (ref 70–99)
Glucose-Capillary: 248 mg/dL — ABNORMAL HIGH (ref 70–99)

## 2014-08-11 LAB — RETICULOCYTES
RBC.: 3.27 MIL/uL — ABNORMAL LOW (ref 4.22–5.81)
Retic Count, Absolute: 196.2 10*3/uL — ABNORMAL HIGH (ref 19.0–186.0)
Retic Ct Pct: 6 % — ABNORMAL HIGH (ref 0.4–3.1)

## 2014-08-11 LAB — LACTATE DEHYDROGENASE: LDH: 635 U/L — AB (ref 94–250)

## 2014-08-11 MED ORDER — LEVALBUTEROL HCL 0.63 MG/3ML IN NEBU
0.6300 mg | INHALATION_SOLUTION | RESPIRATORY_TRACT | Status: DC | PRN
  Administered 2014-08-12: 0.63 mg via RESPIRATORY_TRACT
  Filled 2014-08-11: qty 3

## 2014-08-11 MED ORDER — HEPARIN SODIUM (PORCINE) 1000 UNIT/ML DIALYSIS
1000.0000 [IU] | INTRAMUSCULAR | Status: DC | PRN
  Filled 2014-08-11: qty 6

## 2014-08-11 MED ORDER — PRISMASOL BGK 4/2.5 32-4-2.5 MEQ/L IV SOLN
INTRAVENOUS | Status: DC
  Administered 2014-08-11 – 2014-08-13 (×5): via INTRAVENOUS_CENTRAL
  Filled 2014-08-11 (×5): qty 5000

## 2014-08-11 MED ORDER — PRISMASOL BGK 4/2.5 32-4-2.5 MEQ/L IV SOLN
INTRAVENOUS | Status: DC
  Administered 2014-08-11: 14:00:00 via INTRAVENOUS_CENTRAL
  Filled 2014-08-11 (×4): qty 5000

## 2014-08-11 MED ORDER — SODIUM CHLORIDE 0.9 % FOR CRRT
INTRAVENOUS_CENTRAL | Status: DC | PRN
Start: 2014-08-11 — End: 2014-08-13
  Filled 2014-08-11: qty 1000

## 2014-08-11 MED ORDER — SODIUM CHLORIDE 0.9 % IV SOLN: Freq: Once | INTRAVENOUS | Status: DC

## 2014-08-11 MED ORDER — PRISMASOL BGK 4/2.5 32-4-2.5 MEQ/L IV SOLN
INTRAVENOUS | Status: DC
  Administered 2014-08-11 – 2014-08-13 (×9): via INTRAVENOUS_CENTRAL
  Filled 2014-08-11 (×15): qty 5000

## 2014-08-11 MED ORDER — DEXTROSE-NACL 5-0.9 % IV SOLN
INTRAVENOUS | Status: DC
  Administered 2014-08-11: 07:00:00 via INTRAVENOUS

## 2014-08-11 NOTE — Progress Notes (Signed)
Will hold ambulation due to pts worsening status. Will follow from a distance. Yves Dill CES, ACSM 11:36 AM 08/11/2014

## 2014-08-11 NOTE — Progress Notes (Signed)
Subjective:  Main complaint continues to be of volume overload- I am not sure what to make of creatinine but does not matter as the main issue is volume overload which is not getting better even though on max diuretics- blood pressure better on dopamine and milrinone- started IVIG for low platelets and given blood  Objective Vital signs in last 24 hours: Filed Vitals:   08/11/14 0000 08/11/14 0030 08/11/14 0100 08/11/14 0400  BP: 132/57 126/58 126/64 122/55  Pulse: 91 90 90 91  Temp: 98.2 F (36.8 C) 98.3 F (36.8 C)  98.2 F (36.8 C)  TempSrc: Oral   Oral  Resp: 25 22 26 21   Height:      Weight:    98.2 kg (216 lb 7.9 oz)  SpO2: 96% 96% 96% 97%   Weight change: -1.319 kg (-2 lb 14.5 oz)  Intake/Output Summary (Last 24 hours) at 08/11/14 0753 Last data filed at 08/11/14 0600  Gross per 24 hour  Intake 4443.48 ml  Output   2510 ml  Net 1933.48 ml    Assessment/ Plan: Pt is a 80 y.o. yo male who was admitted on 07/18/2014 with A on CRF  s/p complicated cardiac surgey with complications  Assessment/Plan: 1. Renal- so with better renal perfusion creatinine maybe has improved, making pretty good urine on current therapy but still not able to get a negative fluid balance.  Plan is to start CRRT today- will need to move to ICU part of 2H and will need access.  INR is 1.75 and platelets are low- if IR will not do maybe cards would be willing to place line for CRRT ?  I will write CRRT orders and have informed nursing. 2. HTN/vol- will continue diuretics with CRRT 3. Anemia- gave aranesp- s/p transfusion 4. Hypokalemia- replete 5. Hyponatremia- due to volume overload- will improve as volume status hopefully improves 6. Cardiac- on milrinone/dopamine- hemodynamics are improved- per cards  Breonia Kirstein A       Labs: Basic Metabolic Panel:  Recent Labs Lab 08/07/14 1135  08/10/14 0507 08/10/14 1025 08/11/14 0515  NA 135*  < > 124* 132* 127*  K 3.8  < > 3.0* 3.3* 3.5*   CL 96  < > 82* 86* 83*  CO2 25  < > 28 29 29   GLUCOSE 118*  < > 307* 88 247*  BUN 62*  < > 67* 71* 68*  CREATININE 3.20*  < > 1.44* 3.38* 1.72*  CALCIUM 7.5*  < > 7.8* 8.0* 7.9*  PHOS 4.1  --   --   --  5.2*  < > = values in this interval not displayed. Liver Function Tests:  Recent Labs Lab 08/05/14 0445 08/07/14 1135 08/10/14 0507 08/11/14 0515  AST 76*  --  54*  --   ALT 27  --  25  --   ALKPHOS 179*  --  136*  --   BILITOT 0.3  --  0.3  --   PROT 5.8*  --  5.8*  --   ALBUMIN 2.0* 1.9* 2.0* 2.1*   No results found for this basename: LIPASE, AMYLASE,  in the last 168 hours No results found for this basename: AMMONIA,  in the last 168 hours CBC:  Recent Labs Lab 08/05/14 0445 08/06/14 0505 08/08/14 0453 08/10/14 0507 08/10/14 1025  WBC 11.4* 11.0* 10.5 9.8 8.2  HGB 9.4* 9.1* 9.0* 8.1* 8.8*  HCT 29.6* 29.3* 28.4* 25.5* 27.3*  MCV 105.0* 103.2* 103.3* 102.8* 101.5*  PLT 162 CONSISTENT  WITH PREVIOUS RESULT 112* 46* 37*   Cardiac Enzymes: No results found for this basename: CKTOTAL, CKMB, CKMBINDEX, TROPONINI,  in the last 168 hours CBG:  Recent Labs Lab 08/10/14 1150 08/10/14 1257 08/10/14 1622 08/10/14 2151 08/11/14 0637  GLUCAP 210* 158* 164* 107* 111*    Iron Studies:   Recent Labs  08/10/14 0800  IRON 47  TIBC 219  FERRITIN 1485*   Studies/Results: Dg Chest Port 1 View  08/10/2014   CLINICAL DATA:  Congestive heart failure.  Shortness of breath.  EXAM: PORTABLE CHEST - 1 VIEW  COMPARISON:  08/09/2014  FINDINGS: Cardiomegaly is stable. Diffuse interstitial pulmonary edema with small to moderate bilateral pleural effusions show no significant change. Patient has undergone previous CABG and prostatic heart valve is seen. No pneumothorax identified. Dual lead transvenous pacemaker remains in appropriate position.  IMPRESSION: Moderate congestive heart failure and bilateral pleural effusions, without significant change.   Electronically Signed   By: Earle Gell M.D.   On: 08/10/2014 09:17   Medications: Infusions: . sodium chloride 5 mL/hr at 08/11/14 0600  . sodium chloride 10 mL/hr at 08/09/14 1309  . dextrose 5 % and 0.9% NaCl 10 mL/hr at 08/11/14 0650  . DOPamine 3 mcg/kg/min (08/10/14 2200)  . furosemide (LASIX) infusion 30 mg/hr (08/11/14 0016)  . milrinone 0.25 mcg/kg/min (08/11/14 0015)    Scheduled Medications: . sodium chloride   Intravenous Once  . amiodarone  200 mg Oral BID  . ampicillin (OMNIPEN) IV  2 g Intravenous 3 times per day  . antiseptic oral rinse  7 mL Mouth Rinse BID  . aspirin EC  81 mg Oral Daily  . atorvastatin  10 mg Oral QPM  . cefTRIAXone (ROCEPHIN)  IV  2 g Intravenous Q12H  . [START ON 08/12/2014] darbepoetin (ARANESP) injection - NON-DIALYSIS  100 mcg Subcutaneous Q Wed-HD  . feeding supplement (ENSURE COMPLETE)  237 mL Oral BID BM  . folic acid  2 mg Oral Daily  . IMMUNE GLOBULIN 10% (HUMAN) IV - For Fluid Restriction Only  400 mg/kg Intravenous Q24H  . insulin aspart  0-15 Units Subcutaneous TID WC  . insulin aspart  0-5 Units Subcutaneous QHS  . metolazone  5 mg Oral BID  . pantoprazole  40 mg Oral Daily  . potassium chloride  40 mEq Oral BID  . pyridOXINE  100 mg Oral QPM  . sodium chloride  10-40 mL Intracatheter Q12H  . vitamin B-12  1,000 mcg Oral QPM  . vitamin E  400 Units Oral Daily    have reviewed scheduled and prn medications.  Physical Exam: General: alert, NAD Heart: RRR Lungs: decreased BS at bases Abdomen: distended Extremities: pitting edema    08/11/2014,7:53 AM  LOS: 36 days

## 2014-08-11 NOTE — Consult Note (Signed)
PULMONARY / CRITICAL CARE MEDICINE   Name: LONZA SHIMABUKURO MRN: [id number] DOB: Sep 24, 1933    ADMISSION DATE:  07/31/2014 CONSULTATION DATE:  08/11/14  REFERRING MD :  Dr. Haroldine Laws   CHIEF COMPLAINT:  Volume overload, respiratory failure.   INITIAL PRESENTATION: 80 y/o M initially admitted 8/3 with dyspnea, fever.  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  Had CABG with AVR/MVR.  Developed acute respiratory failure and renal failure with hypervolemia.  PCCM consulted to assist with management of respiratory failure.  STUDIES:  8/03  CTA Chest >> no PE, bilateral effusions, extensive GGO, nodular opacities (largest 26mm) 8/04  ECHO >> EF 50-55%, Grade 2 DD, mod AI, mod-severe MR/TR, severe pulmonary HTN PA peak 65 8/06  CT ABD/Pelvis >> no source for bacteremia, anasarca, mod-large pleural effusions 8/06  CT Head >> negative for acute abnormality 8/11  TTE >> nml LV fxn, severe MR, large aortic valve vegetation w mod AI, no pacemaker vegetation identified 8/12  HIT >> neg  9/02  ECHO >> EF 50-55%, no wma, bioprosthesis of AV/MV, mod TR, PA peak 44  SIGNIFICANT EVENTS: 8/03  Admit for sepsis 8/11  Removal of Medtronic dual-chamber pacemaker secondary to enterococcal endoaortitis  8/14  LHC >> heavily calcified ostial circumflex with 70% stenosis, otherwise no critical stenosis 8/21  AVR, Aortic Root replacement, MVR, CABG x1, MAZE procedure per Dr. 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  He has also been followed by Hematology (Dr. Marin Olp) as an outpatient for immune thrombocytopenia (on prednisone and IVIG).   He was initially admitted on 07/20/2014 with concerns for sepsis.  Pacemaker was subsequently removed and he underwent an AVR with aortic root replacement & reimplantation of coronary arteries.  Post-op course was complicated by complete heart block and he is now post placement of permanent pacemaker, volume overload with worsening respiratory status, acute renal failure and thrombocytopenia (HIT negative, Hematology following).    9/8 am, the patient developed worsening respiratory status with desaturations into low 80's with any movement.  PCCM re-consulted for evaluation.  See events as above for details.     PAST MEDICAL HISTORY :  Past Medical History  Diagnosis Date  . CAD (coronary artery disease) prior stenting 1999   . Dyslipidemia   . HTN (hypertension)   . WPW (Wolff-Parkinson-White syndrome) loss of preexcitation   . Atrial fibrillation   . AV block, Mobitz II   . Presyncope   . Myocardial infarction   . Pacemaker 04/07/2013  . Arthritis   . History of chicken pox   . Anemia   . Hypernatremia 06/03/2013  . Thrombocytopenia, unspecified 06/03/2013  . Leukopenia 06/03/2013  . Obstructive sleep apnea 06/03/2013    USES CPAP  . Urinary incontinence 06/03/2013  . Hyperglycemia 12/21/2013  . Pancytopenia 06/03/2013  . Iron deficiency anemia, unspecified 07/02/2014  . Malabsorption of iron 07/02/2014  . Hypotestosteronemia 07/02/2014  . ITP (idiopathic thrombocytopenic purpura) 07/02/2014    possible - although patient had bacterial endocarditis at the time of  diagnosis  . Aortic insufficiency   . Chronic kidney disease   . Bacterial endocarditis      Enterococcus sepsis with aortic valve vegetation  . UTI (urinary tract infection) 07/15/2014    ENTEROBACTER CLOACAE   . S/P aortic valve and mitral valve replacement 08/01/2014    21 mm Medtronic  Freestyle porcine aortic root graft 29 mm [facility] Mitral bovine bioprosthetic mitral valve   . S/P aortic valve replacement with stentless valve 08/02/2014    21 mm Medtronic Freestyle porcine aortic root graft with reimplantation of left main and right coronary arteries  . S/P mitral valve replacement with bioprosthetic valve 07/30/2014    29 mm [facility] Mitral bovine bioprosthetic tissue valve  . S/P CABG x 1 07/11/2014    SVG to OM1 with EVH via right thigh  . S/P Maze operation for atrial fibrillation 07/21/2014    Complete bilateral atrial lesion set using cryothermy and bipolar radiofrequency ablation with clipping of LA appendage  . Acute renal failure superimposed on stage 3 chronic kidney disease 07/27/2014  . Acute on chronic diastolic heart failure 7/89/3810   Past Surgical History  Procedure Laterality Date  . Coronary angioplasty with stent placement    . Vastectomy    . Insert / replace / remove pacemaker  04/07/2013  . Tee without cardioversion N/A 07/25/2014    Procedure: TRANSESOPHAGEAL ECHOCARDIOGRAM (TEE);  Surgeon: Lelon Perla, MD;  Location: [place];  Service: Cardiovascular;  Laterality: N/A;  . Aortic valve replacement N/A 07/21/2014    Procedure: ROOT REPLACEMENT WITH BIOPROSTHETIC PORCINE AORTIC ROOT REIMPLANTATION OF LEFT MAIN AND RIGHT CORONARY ARTERIES;  Surgeon: Rexene Alberts, MD;  Location: [place];  Service: Open Heart Surgery;  Laterality: N/A;  . Intraoperative transesophageal echocardiogram N/A 08/03/2014    Procedure: INTRAOPERATIVE TRANSESOPHAGEAL ECHOCARDIOGRAM;  Surgeon: Rexene Alberts, MD;  Location: [place];  Service: Open Heart Surgery;  Laterality: N/A;  . Maze N/A 07/13/2014    Procedure: MAZE;  Surgeon: Rexene Alberts, MD;  Location: [place];  Service: Open Heart Surgery;  Laterality: N/A;  . Coronary artery bypass graft N/A 07/23/2014    Procedure: CORONARY ARTERY BYPASS GRAFTING (CABG), on pump, times one, using right greater saphenous  vein harvested endoscopically.;  Surgeon: Rexene Alberts, MD;  Location: [place];  Service: Open Heart Surgery;  Laterality: N/A;  . Mitral valve replacement N/A 07/17/2014    Procedure: MITRAL VALVE (MV) REPLACEMENT;  Surgeon: Rexene Alberts, MD;  Location: [place];  Service: Open Heart Surgery;  Laterality: N/A;   Prior to Admission medications   Medication Sig Start Date End Date Taking? Authorizing Provider  atorvastatin (LIPITOR) 10 MG tablet Take 1 tablet (10 mg total) by mouth every evening. 03/18/14  Yes Lelon Perla, MD  Azelaic Acid (FINACEA) 15 % cream Apply 1 application topically daily. After skin is thoroughly washed and patted dry, gently but thoroughly massage a thin film of azelaic acid creame   Yes Historical Provider, MD  Cholecalciferol (VITAMIN D-3) 1000 UNITS CAPS Take 1 capsule by mouth daily.   Yes Historical Provider, MD  furosemide (LASIX) 20 MG tablet Take 3 tablets (60 mg total) by mouth daily. 06/11/14  Yes Lelon Perla, MD  losartan (COZAAR) 50 MG tablet Take 1 tablet (50 mg total) by mouth daily. 06/03/14  Yes Lelon Perla, MD  Multiple Vitamin (MULTIVITAMIN WITH MINERALS) TABS Take 1 tablet by mouth 2 (two) times a week.    Yes  Historical Provider, MD  nadolol (CORGARD) 20 MG tablet Take 10 mg by mouth daily. 03/18/14  Yes Lelon Perla, MD  nitroGLYCERIN (NITROSTAT) 0.4 MG SL tablet Place 1 tablet (0.4 mg total) under the tongue every 5 (five) minutes as needed for chest pain. x3 doses as needed for chest pain 03/18/14  Yes Lelon Perla, MD  omeprazole (PRILOSEC) 20 MG capsule Take 20 mg by mouth daily as needed (for heartburn).  04/28/14  Yes Mosie Lukes, MD  Polyethyl Glycol-Propyl Glycol (SYSTANE ULTRA OP) Apply 1 drop to eye 4 (four) times daily as needed (for dry eyes).   Yes Historical Provider, MD  predniSONE (DELTASONE) 20 MG tablet Take 80 mg by mouth daily with breakfast.   Yes Historical Provider, MD  Probiotic Product (PROBIOTIC DAILY PO) Take  1 capsule by mouth daily.    Yes Historical Provider, MD  pyridOXINE (VITAMIN B-6) 100 MG tablet Take 50 mg by mouth every evening.    Yes Historical Provider, MD  tamsulosin (FLOMAX) 0.4 MG CAPS capsule Take 0.4 mg by mouth at bedtime.   Yes Historical Provider, MD  vitamin B-12 (CYANOCOBALAMIN) 1000 MCG tablet Take 1,000 mcg by mouth every evening.   Yes Historical Provider, MD  Vitamin E (VITA-PLUS E PO) Take 1 capsule by mouth daily.   Yes Historical Provider, MD   No Known Allergies  FAMILY HISTORY:  Family History  Problem Relation Age of Onset  . Stroke Mother   . Hypertension Mother   . Stroke Sister   . Arthritis Sister     rheumatoid  . Heart attack Sister   . Anemia Sister   . Other Brother     tube put in aorta  . Anemia Brother   . Other Son     cortisone deficiency  . Arthritis Son   . Stroke Brother   . Alcohol abuse Brother   . Barrett's esophagus Son   . Colon cancer Maternal Grandmother   . Colon cancer Maternal Grandfather    SOCIAL HISTORY:  reports that he quit smoking about 22 years ago. His smoking use included Cigarettes. He started smoking about 59 years ago. He has a 36 pack-year smoking history. He has never used smokeless tobacco. He reports that he drinks alcohol. He reports that he does not use illicit drugs.  REVIEW OF SYSTEMS:   Gen: Denies fever, chills, weight change, fatigue, night sweats HEENT: Denies blurred vision, double vision, hearing loss, tinnitus, sinus congestion, rhinorrhea, sore throat, neck stiffness, dysphagia PULM: Denies cough, sputum production, hemoptysis, wheezing.  Reports shortness of breath at rest & with exertion CV: Denies chest pain, edema, orthopnea, paroxysmal nocturnal dyspnea, palpitations GI: Denies abdominal pain, nausea, vomiting, diarrhea, hematochezia, melena, constipation, change in bowel habits GU: Denies dysuria, hematuria, polyuria, oliguria, urethral discharge Endocrine: Denies hot or cold intolerance,  polyuria, polyphagia or appetite change Derm: Denies rash, dry skin, scaling or peeling skin change Heme: Denies easy bruising, bleeding, bleeding gums Neuro: Denies headache, numbness, weakness, slurred speech, loss of memory or consciousness   SUBJECTIVE: RN reports desaturations into low 80's with movement  VITAL SIGNS: Temp:  [97 F (36.1 C)-98.6 F (37 C)] 97.8 F (36.6 C) (09/08 0833) Pulse Rate:  [74-93] 91 (09/08 0833) Resp:  [20-31] 26 (09/08 0833) BP: (99-137)/(48-69) 135/62 mmHg (09/08 0833) SpO2:  [92 %-100 %] 96 % (09/08 0833) Weight:  [216 lb 7.9 oz (98.2 kg)] 216 lb 7.9 oz (98.2 kg) (09/08 0400)  HEMODYNAMICS: CVP:  [61  mmHg] 13 mmHg  VENTILATOR SETTINGS:   INTAKE / OUTPUT:  Intake/Output Summary (Last 24 hours) at 08/11/14 1101 Last data filed at 08/11/14 1006  Gross per 24 hour  Intake 3938.18 ml  Output   2370 ml  Net 1568.18 ml    PHYSICAL EXAMINATION: General: pleasant, mild increased WOB, speaks in full sentences Neuro:  Alert, normal strength, CN intact HEENT:  Wears glasses, no sinus tenderness, no oral exudate Cardiovascular:  Irregular, 2/6 SM Lungs:  B/l crackles, decreased BS at bases Abdomen:  Soft, non tender, + bowel sounds Musculoskeletal:  3+ edema Skin:  No rashes  LABS:  CBC  Recent Labs Lab 08/10/14 0507 08/10/14 1025 08/11/14 0845  WBC 9.8 8.2 7.4  HGB 8.1* 8.8* 10.4*  HCT 25.5* 27.3* 31.4*  PLT 46* 37* 29*   Coag's  Recent Labs Lab 08/09/14 0457 08/10/14 0507 08/11/14 0515  INR 1.96* 2.10* 1.75*   BMET  Recent Labs Lab 08/10/14 0507 08/10/14 1025 08/11/14 0515  NA 124* 132* 127*  K 3.0* 3.3* 3.5*  CL 82* 86* 83*  CO2 28 29 29   BUN 67* 71* 68*  CREATININE 1.44* 3.38* 1.72*  GLUCOSE 307* 88 247*   Electrolytes  Recent Labs Lab 08/07/14 1135  08/10/14 0507 08/10/14 1025 08/11/14 0515  CALCIUM 7.5*  < > 7.8* 8.0* 7.9*  PHOS 4.1  --   --   --  5.2*  < > = values in this interval not  displayed.  ABG No results found for this basename: PHART, PCO2ART, PO2ART,  in the last 168 hours  Liver Enzymes  Recent Labs Lab 08/05/14 0445 08/07/14 1135 08/10/14 0507 08/11/14 0515  AST 76*  --  54*  --   ALT 27  --  25  --   ALKPHOS 179*  --  136*  --   BILITOT 0.3  --  0.3  --   ALBUMIN 2.0* 1.9* 2.0* 2.1*   Cardiac Enzymes  Recent Labs Lab 08/05/14 0445  PROBNP 8213.0*   Glucose  Recent Labs Lab 08/10/14 1150 08/10/14 1257 08/10/14 1622 08/10/14 2151 08/11/14 0637 08/11/14 0836  GLUCAP 210* 158* 164* 107* 111* 115*    Imaging Dg Chest Port 1 View  08/10/2014   CLINICAL DATA:  Congestive heart failure.  Shortness of breath.  EXAM: PORTABLE CHEST - 1 VIEW  COMPARISON:  08/09/2014  FINDINGS: Cardiomegaly is stable. Diffuse interstitial pulmonary edema with small to moderate bilateral pleural effusions show no significant change. Patient has undergone previous CABG and prostatic heart valve is seen. No pneumothorax identified. Dual lead transvenous pacemaker remains in appropriate position.  IMPRESSION: Moderate congestive heart failure and bilateral pleural effusions, without significant change.   Electronically Signed   By: Earle Gell M.D.   On: 08/10/2014 09:17     ASSESSMENT / PLAN:  PULMONARY A: Acute Hypoxic Respiratory Failure - in setting of volume overload / CHF / b/l pleural effusions. Hx of OSA on CPAP as outpt. P:   Attempt trial of bipap until CVVHD initiated, hopeful to avoid intubation Assess pleural space with [place] at bedside Trend CXR   CARDIOVASCULAR PICC 8/28 >>  A:  CAD s/p CABG x1 with MVR/AVR - 8/21 per Dr. Roxy Manns.  PAF - controlled, not on anticoagulation. CHB s/p Pacemaker - 8/28 per Dr. Lovena Le. P:  Continue Amiodarone, ASA, Lipitor Zaroxolyn, Dopamine, Lasix, Milrinone gtts per CHF SVC  RENAL A:   Acute Kidney Injury 2nd to cardiorenal syndrome- improved but remains volume overloaded. Hypokalemia.  Hyponatremia in setting  of hypervolemia.  P:   CVVHD Replace electrolytes as indicated   GASTROINTESTINAL A:   Protein Calorie Malnutrition - in setting of critical illness. P:   NPO for now with respiratory distress Consider insertion of small bore feeding tube for nutritional support if resp status does not improve  PPI for SUP  HEMATOLOGIC A:   Anemia of critical illness. Thrombocytopenia - concern for ITP, followed by Dr. Marin Olp.  HIT negative. P:  Monitor platelets Tx 1 pk platelets now for HD cath insertion on 9/08 IVIG, Prednisone per Dr. Marin Olp  INFECTIOUS A:   Enterococcal Endocarditis. P:   BCx2 9/8 >> UC 9/8 >> Ampicillin, start date 8/08, day 32/x Rocephin, start date 8/23, 17/x  Will need ID f/u to determine stop date for Abx >> last seen by ID on 08/03/14 with recommendation to continue Abx at least until 08/04/2014  ENDOCRINE A:   Hyperglycemia - in setting of critical illness + steroid administration  P:   SSI   NEUROLOGIC A:   TIA 8/06 >> resolved. P:   Monitor mental status, supportive care  TODAY'S SUMMARY: 80 y/o M s/p AVR/MVR + CABG x1 in setting of enterococcal endocarditis, now with worsening CHF / volume overload.  Pending placement of HD cath, CVVHD.  Trial bipap, hopeful to avoid intubation.     Noe Gens, NP-C [facility] Pgr: [phone] or [phone] 08/11/2014, 11:01 AM  Reviewed above, examined.  80 yo male with extensive medical course over past one month with endocarditis, CHF, CAD, ITP.  Now with renal failure, hypervolemia, and acute hypoxic respiratory failure.  Will give PLT transfusion x one for insertion of HD catheter.  Will try to bridge respiratory failure with BiPAP, and hopefully avoid need for intubation.  If pleural effusions persist, then might need to consider thoracentesis >> effusions likely transudate in setting of heart failure.  Continue heart failure management per TCTS and CHF team.  Updated pt's wife at  bedside.  Reviewed risks/benefits of HD catheter, and they are agreeable.  CC time 50 minutes.  Chesley Mires, MD [facility] Pulmonary/Critical Care 08/11/2014, 12:17 PM Pager:  [phone] After 3pm call: [phone]

## 2014-08-11 NOTE — Progress Notes (Signed)
Note/chart reviewed.  Katie Caroleann Casler, RD, LDN Pager #: [phone] After-Hours Pager #: [phone]  

## 2014-08-11 NOTE — Progress Notes (Signed)
Patient very short of breath and ashen with limited movement on 3L/[place].  O2 Sats 83-91%.  Dr. Haroldine Laws at bedside.  Drs. Roxy Manns and [place] also notified.  Orders given and initiated to transport to ICU for aggressive diuresing and monitorin.  Will continue to monitor for changes.

## 2014-08-11 NOTE — Progress Notes (Addendum)
TCTS DAILY ICU PROGRESS NOTE                   [address]            [place] [address]          [phone]   11 Days Post-Op Procedure(s) (LRB): PERMANENT PACEMAKER INSERTION (N/A)  Total Length of Stay:  LOS: 36 days   Subjective: Patient frustrated  Objective: Vital signs in last 24 hours: Temp:  [97 F (36.1 C)-98.6 F (37 C)] 98.2 F (36.8 C) (09/08 0400) Pulse Rate:  [66-93] 91 (09/08 0400) Cardiac Rhythm:  [-] Ventricular paced (09/08 0000) Resp:  [18-31] 21 (09/08 0400) BP: (99-137)/(48-69) 122/55 mmHg (09/08 0400) SpO2:  [92 %-100 %] 97 % (09/08 0400) Weight:  [216 lb 7.9 oz (98.2 kg)] 216 lb 7.9 oz (98.2 kg) (09/08 0400)  Filed Weights   08/09/14 0300 08/10/14 0500 08/11/14 0400  Weight: 214 lb 11.2 oz (97.387 kg) 219 lb 6.4 oz (99.519 kg) 216 lb 7.9 oz (98.2 kg)    Weight change: -2 lb 14.5 oz (-1.319 kg)   Hemodynamic parameters for last 24 hours: CVP:  [13 mmHg] 13 mmHg  Intake/Output from previous day: 09/07 0701 - 09/08 0700 In: 4443.5 [P.O.:1490; I.V.:1696.4; Blood:732.1; IV Piggyback:300] Out: 2510 [Urine:2510]  Intake/Output this shift:    Current Meds: Scheduled Meds: . sodium chloride   Intravenous Once  . amiodarone  200 mg Oral BID  . ampicillin (OMNIPEN) IV  2 g Intravenous 3 times per day  . antiseptic oral rinse  7 mL Mouth Rinse BID  . aspirin EC  81 mg Oral Daily  . atorvastatin  10 mg Oral QPM  . cefTRIAXone (ROCEPHIN)  IV  2 g Intravenous Q12H  . [START ON 08/12/2014] darbepoetin (ARANESP) injection - NON-DIALYSIS  100 mcg Subcutaneous Q Wed-HD  . feeding supplement (ENSURE COMPLETE)  237 mL Oral BID BM  . folic acid  2 mg Oral Daily  . IMMUNE GLOBULIN 10% (HUMAN) IV - For Fluid Restriction Only  400 mg/kg Intravenous Q24H  . insulin aspart  0-15 Units Subcutaneous TID WC  . insulin aspart  0-5 Units Subcutaneous QHS  . metolazone  5 mg Oral BID  . pantoprazole  40 mg Oral Daily  . potassium chloride  40  mEq Oral BID  . pyridOXINE  100 mg Oral QPM  . sodium chloride  10-40 mL Intracatheter Q12H  . vitamin B-12  1,000 mcg Oral QPM  . vitamin E  400 Units Oral Daily   Continuous Infusions: . sodium chloride 5 mL/hr at 08/11/14 0600  . sodium chloride 10 mL/hr at 08/09/14 1309  . dextrose 5 % and 0.9% NaCl 10 mL/hr at 08/11/14 0650  . DOPamine 3 mcg/kg/min (08/10/14 2200)  . furosemide (LASIX) infusion 30 mg/hr (08/11/14 0016)  . milrinone 0.25 mcg/kg/min (08/11/14 0015)   PRN Meds:.acetaminophen, dextrose, levalbuterol, ondansetron (ZOFRAN) IV, ondansetron (ZOFRAN) IV, ondansetron, promethazine, sodium chloride, traMADol, zolpidem  General appearance: alert, cooperative and no distress Heart: RRR, grade Ii/VI systolic murmur Lungs: clear to auscultation bilaterally Abdomen: soft, non-tender; bowel sounds normal; no masses,  no organomegaly Extremities: +++ bilateral pitting lower extremity edema. Lower legs wrapped in ACE Wound: Clean and dry  Lab Results: CBC: Recent Labs  08/10/14 0507 08/10/14 1025  WBC 9.8 8.2  HGB 8.1* 8.8*  HCT 25.5* 27.3*  PLT 46* 37*   BMET:  Recent Labs  08/10/14 1025 08/11/14 0515  NA  132* 127*  K 3.3* 3.5*  CL 86* 83*  CO2 29 29  GLUCOSE 88 247*  BUN 71* 68*  CREATININE 3.38* 1.72*  CALCIUM 8.0* 7.9*    PT/INR:  Recent Labs  08/11/14 0515  LABPROT 20.4*  INR 1.75*   Radiology: Dg Chest Port 1 View  08/10/2014   CLINICAL DATA:  Congestive heart failure.  Shortness of breath.  EXAM: PORTABLE CHEST - 1 VIEW  COMPARISON:  08/09/2014  FINDINGS: Cardiomegaly is stable. Diffuse interstitial pulmonary edema with small to moderate bilateral pleural effusions show no significant change. Patient has undergone previous CABG and prostatic heart valve is seen. No pneumothorax identified. Dual lead transvenous pacemaker remains in appropriate position.  IMPRESSION: Moderate congestive heart failure and bilateral pleural effusions, without significant  change.   Electronically Signed   By: Earle Gell M.D.   On: 08/10/2014 09:17     Assessment/Plan: S/P Procedure(s) (LRB): PERMANENT PACEMAKER INSERTION (N/A)  1. CV-Previous PAF. Paced this am. On Amiodareone 200 bid,Dopamine and Milrinone drips-per Dr. Haroldine Laws 2.Pulmonary- on 2-3 liters of oxygen via [place].Encourage incentive spirometer 3.Volume Overload-On Zaroxolyn and Lasix drip.  4. ABL anemia - last H and H stable at 8.8 and 27.3. On Aranesp. 5.Hypokalemia-supplement per nephrology 6.Renal-per nephrology, hope to start CRRT today after access line placed. 7. Thrombocytopenia 8. ID-on Ampicillin and Rocephin for endocarditis  ZIMMERMAN,DONIELLE M PA-C 08/11/2014 8:10 AM  I have seen and examined the patient and agree with the assessment and plan as outlined.  No improvement over the weekend.  Looks more short of breath.  Would benefit from CVVHD for volume removal.  Moderate right and small left pleural effusions on pCXR yesterday - no CXR done today.  Will recheck tomorrow.  [name],[name] 08/11/2014 8:52 AM

## 2014-08-11 NOTE — Progress Notes (Signed)
NUTRITION FOLLOW UP  DOCUMENTATION CODES Per approved criteria  -Not Applicable   INTERVENTION: Continue Ensure Complete po BID, each supplement provides 350 kcal and 13 grams of protein RD to follow for nutrition care plan  NUTRITION DIAGNOSIS: Increased nutrient needs related to post-op healing as evidenced by estimated nutrition needs, ongoing  Goal: Pt to meet >/= 90% of their estimated nutrition needs, not meeting  Monitor:  PO & supplemental intake, weight, labs, I/O's  ASSESSMENT: 80 y.o. Male with history of CAD status post stents, HTN, dyslipidemia, atrial fibrillation no longer on anticoagulation secondary to recent GI bleed, pacemaker, ITP currently being treated with IVIG by Dr. Marin Olp (most recently 8/3) and prednisone 80mg  daily, who presented to the emergency department with complaints of fever and shortness of breath that started 8/2. In ED found to have leukocytosis, hypotension, and lactic acidosis.    Patient s/p procedures 8/21: AORTIC ROOT REPLACEMENT MITRAL VALVE REPLACEMENT CORONARY BYPASS GRAFTING MAZE PROCEDURE  Pt currently on regular diet. Per documentation, volume overloaded. PO intake 50% per pt and family.  Receiving Ensure Complete -- drinking, continue to send.    Labs reviewed: Low Na & K High BUN & Creatinine Glucose: 247  Height: Ht Readings from Last 1 Encounters:  07/08/14 5\' 11"  (1.803 m)    Weight: Wt Readings from Last 1 Encounters:  08/11/14 216 lb 7.9 oz (98.2 kg)    BMI:  Body mass index is 30.21 kg/(m^2).  Estimated Nutritional Needs: Kcal: 1900-2100 Protein: 100-110 gm Fluid: per MD  Skin: chest surgical incision, +2 generalized edema, +4 RLE and LLE edema   Diet Order: Regular   Intake/Output Summary (Last 24 hours) at 08/11/14 1009 Last data filed at 08/11/14 1006  Gross per 24 hour  Intake 3990.78 ml  Output   2370 ml  Net 1620.78 ml    Labs:   Recent Labs Lab 08/07/14 1135  08/10/14 0507  08/10/14 1025 08/11/14 0515  NA 135*  < > 124* 132* 127*  K 3.8  < > 3.0* 3.3* 3.5*  CL 96  < > 82* 86* 83*  CO2 25  < > 28 29 29   BUN 62*  < > 67* 71* 68*  CREATININE 3.20*  < > 1.44* 3.38* 1.72*  CALCIUM 7.5*  < > 7.8* 8.0* 7.9*  PHOS 4.1  --   --   --  5.2*  GLUCOSE 118*  < > 307* 88 247*  < > = values in this interval not displayed.  CBG (last 3)   Recent Labs  08/10/14 2151 08/11/14 0637 08/11/14 0836  GLUCAP 107* 111* 115*    Scheduled Meds: . sodium chloride   Intravenous Once  . amiodarone  200 mg Oral BID  . ampicillin (OMNIPEN) IV  2 g Intravenous 3 times per day  . antiseptic oral rinse  7 mL Mouth Rinse BID  . aspirin EC  81 mg Oral Daily  . atorvastatin  10 mg Oral QPM  . cefTRIAXone (ROCEPHIN)  IV  2 g Intravenous Q12H  . [START ON 08/12/2014] darbepoetin (ARANESP) injection - NON-DIALYSIS  100 mcg Subcutaneous Q Wed-HD  . feeding supplement (ENSURE COMPLETE)  237 mL Oral BID BM  . folic acid  2 mg Oral Daily  . IMMUNE GLOBULIN 10% (HUMAN) IV - For Fluid Restriction Only  400 mg/kg Intravenous Q24H  . insulin aspart  0-15 Units Subcutaneous TID WC  . insulin aspart  0-5 Units Subcutaneous QHS  . metolazone  5 mg Oral  BID  . pantoprazole  40 mg Oral Daily  . potassium chloride  40 mEq Oral BID  . pyridOXINE  100 mg Oral QPM  . sodium chloride  10-40 mL Intracatheter Q12H  . vitamin B-12  1,000 mcg Oral QPM  . vitamin E  400 Units Oral Daily    Continuous Infusions: . sodium chloride 5 mL/hr at 08/11/14 0600  . sodium chloride 10 mL/hr at 08/09/14 1309  . dextrose 5 % and 0.9% NaCl 10 mL/hr at 08/11/14 0650  . DOPamine 3 mcg/kg/min (08/10/14 2200)  . furosemide (LASIX) infusion 30 mg/hr (08/11/14 0956)  . milrinone 0.25 mcg/kg/min (08/11/14 0015)  . dialysis replacement fluid (prismasate)    . dialysis replacement fluid (prismasate)    . dialysate (PRISMASATE)      Clayton Bibles, MS, [place] Licensed Dietitian Nutritionist Pager:  [phone]

## 2014-08-11 NOTE — Progress Notes (Signed)
Pt resting comfortably and is not wanting to wear his BiPAP tonight. SATS are stable with his nasal cannula on.

## 2014-08-11 NOTE — Progress Notes (Addendum)
    Subjective:    Remains dyspneic.CCM following. Will start CVVHD today  Platelets 112k-> 37k-> 29k . On IVIG and NPlate.  No active bleeding   Objective:  Filed Vitals:   08/11/14 0100 08/11/14 0400 08/11/14 0833 08/11/14 1000  BP: 126/64 122/55 135/62 138/86  Pulse: 90 91 91 103  Temp:  98.2 F (36.8 C) 97.8 F (36.6 C)   TempSrc:  Oral Oral   Resp: 26 21 26 27   Height:      Weight:  98.2 kg (216 lb 7.9 oz)    SpO2: 96% 97% 96% 94%    Intake/Output from previous day:  Intake/Output Summary (Last 24 hours) at 08/11/14 1112 Last data filed at 08/11/14 1006  Gross per 24 hour  Intake 3938.18 ml  Output   2370 ml  Net 1568.18 ml    Physical Exam: Physical exam:  Dyspneic and pale Skin is warm and dry.  HEENT is normal.  Neck is supple. JVP up LIJ trialysis Chest bilateral rhonchi . Pacemaker site with no hematoma Cardiovascular exam is regular rate and rhythm. 2/6 systolic murmur Abdominal exam nontender or distended. No masses palpated. Extremities show 3-4+ edema into thigh. LEs wrapped neuro grossly intact    Lab Results: Basic Metabolic Panel:  Recent Labs  08/10/14 1025 08/11/14 0515  NA 132* 127*  K 3.3* 3.5*  CL 86* 83*  CO2 29 29  GLUCOSE 88 247*  BUN 71* 68*  CREATININE 3.38* 1.72*  CALCIUM 8.0* 7.9*  PHOS  --  5.2*   CBC:  Recent Labs  08/10/14 1025 08/11/14 0845  WBC 8.2 7.4  HGB 8.8* 10.4*  HCT 27.3* 31.4*  MCV 101.5* 97.2  PLT 37* 29*     Assessment/Plan:  1 status post aortic root replacement, aortic valve replacement, mitral valve replacement, coronary artery bypassing graft and maze-Continue rehab. 2 acute on chronic kidney failure-nephrology following. Significantly volume overloaded. 3 thrombocytopenia-improved. Hematology following. 4 paroxysmal atrial fibrillation-patient is in AV paced rhythm today. Continue amiodarone and Coumadin. 5 CAD-continue aspirin and statin. 6 status post pacemaker 7  endocarditis-continue antibiotics; will need fu with ID 8 Volume excess/right heart failure- 9 Acute respiratory failure  Good urine output but continues to gain weight. Plan CVVHD starting today.   Will continue milrinone as tolerated.   Lashea Goda,MD 11:12 AM

## 2014-08-11 NOTE — Progress Notes (Signed)
Mr. Valentine looks a little better today. He did get 2 units of blood. He is still on the lower known drug. He is still on the Lasix drip. He also is on low-dose dopamine.  We did start him on IVIG yesterday. He had 2 units of blood. He also got Nplate.  I have to suspect that the rapid drop in his platelet count is up from immune thrombocytopenia. Again, he is on quite a few medications. He is on amiodarone now. He continues on folic acid vitamin B6.  His appetite might be a little bit better. He still is not doing much in the way of physical therapy.  There's been no bleeding.  I thought about doing another bone marrow test on him. This might be some that we have to consider if we cannot his platelet count back up.  His LDH is quite high. His creatinine is 1.72. Hopefully this is a true value. A CBC was not ordered. We will have to see about getting this daily.  I still have to look at his blood smear.  His reticulocyte count is actually on the higher side. I don't think he is hemolyzing, however.  His vital signs look okay. His temperature 98.2. Blood pressure 122/55. Pulse is 91. His lungs sound clear. Cardiac exam regular rate and rhythm. Abdomen is soft. There is no palpable liver or spleen. Extremities shows some slight decrease in his edema.  Again, he is getting IVIG. We gave him a dose of Nplate. I want to try to avoid steroids given his blood sugars being quite high.  Harriette Ohara 17:14

## 2014-08-11 NOTE — Progress Notes (Signed)
Pt's Na was 127. PA on call paged of result.

## 2014-08-11 NOTE — Progress Notes (Signed)
PA called back about Na level. Orders given.

## 2014-08-11 NOTE — Procedures (Signed)
Central Venous Catheter Insertion Procedure Note FELICIANO WYNTER [id number] October 07, 1933  Procedure: Insertion of Hemodialysis catheter Indications: Cardio-renal syndrome  Procedure Details Consent: Risks of procedure as well as the alternatives and risks of each were explained to the (patient/caregiver).  Consent for procedure obtained. Time Out: Verified patient identification, verified procedure, site/side was marked, verified correct patient position, special equipment/implants available, medications/allergies/relevent history reviewed, required imaging and test results available.  Performed  Maximum sterile technique was used including antiseptics, cap, gloves, gown, hand hygiene, mask and sheet. Skin prep: Chlorhexidine; local anesthetic administered A antimicrobial bonded/coated triple lumen catheter was placed in the left internal jugular vein using the Seldinger technique.  Given transfusion 1 unit PLT prior to procedure.  Evaluation Blood flow good Complications: No apparent complications Patient did tolerate procedure well. Chest X-ray ordered to verify placement.  CXR: pending.  Performed by Noe Gens, NP using u/s guidance.  I was present for procedure.  Chesley Mires, MD [facility] Pulmonary/Critical Care 08/11/2014, 12:53 PM Pager:  [phone] After 3pm call: [phone]'

## 2014-08-12 ENCOUNTER — Inpatient Hospital Stay (HOSPITAL_COMMUNITY): Payer: Medicare Other

## 2014-08-12 DIAGNOSIS — Z951 Presence of aortocoronary bypass graft: Secondary | ICD-10-CM

## 2014-08-12 DIAGNOSIS — J209 Acute bronchitis, unspecified: Secondary | ICD-10-CM

## 2014-08-12 DIAGNOSIS — Z7982 Long term (current) use of aspirin: Secondary | ICD-10-CM

## 2014-08-12 LAB — CBC
HCT: 31 % — ABNORMAL LOW (ref 39.0–52.0)
HEMOGLOBIN: 10 g/dL — AB (ref 13.0–17.0)
MCH: 31.5 pg (ref 26.0–34.0)
MCHC: 32.3 g/dL (ref 30.0–36.0)
MCV: 97.8 fL (ref 78.0–100.0)
Platelets: 41 10*3/uL — ABNORMAL LOW (ref 150–400)
RBC: 3.17 MIL/uL — AB (ref 4.22–5.81)
RDW: 23.8 % — ABNORMAL HIGH (ref 11.5–15.5)
WBC: 6.7 10*3/uL (ref 4.0–10.5)

## 2014-08-12 LAB — RETICULOCYTES
RBC.: 3.17 MIL/uL — ABNORMAL LOW (ref 4.22–5.81)
Retic Count, Absolute: 209.2 10*3/uL — ABNORMAL HIGH (ref 19.0–186.0)
Retic Ct Pct: 6.6 % — ABNORMAL HIGH (ref 0.4–3.1)

## 2014-08-12 LAB — BODY FLUID CELL COUNT WITH DIFFERENTIAL
Eos, Fluid: 1 %
Lymphs, Fluid: 18 %
Monocyte-Macrophage-Serous Fluid: 4 % — ABNORMAL LOW (ref 50–90)
Neutrophil Count, Fluid: 77 % — ABNORMAL HIGH (ref 0–25)
OTHER CELLS FL: 0 %
Total Nucleated Cell Count, Fluid: 496 cu mm (ref 0–1000)

## 2014-08-12 LAB — RENAL FUNCTION PANEL
Albumin: 2.2 g/dL — ABNORMAL LOW (ref 3.5–5.2)
Albumin: 2.2 g/dL — ABNORMAL LOW (ref 3.5–5.2)
Anion gap: 13 (ref 5–15)
Anion gap: 13 (ref 5–15)
BUN: 54 mg/dL — ABNORMAL HIGH (ref 6–23)
BUN: 42 mg/dL — ABNORMAL HIGH (ref 6–23)
CALCIUM: 8.2 mg/dL — AB (ref 8.4–10.5)
CALCIUM: 8.2 mg/dL — AB (ref 8.4–10.5)
CHLORIDE: 91 meq/L — AB (ref 96–112)
CO2: 29 meq/L (ref 19–32)
CO2: 28 mEq/L (ref 19–32)
CREATININE: 2.2 mg/dL — AB (ref 0.50–1.35)
Chloride: 94 mEq/L — ABNORMAL LOW (ref 96–112)
Creatinine, Ser: 1.97 mg/dL — ABNORMAL HIGH (ref 0.50–1.35)
GFR calc Af Amer: 35 mL/min — ABNORMAL LOW (ref >90)
GFR calc non Af Amer: 27 mL/min — ABNORMAL LOW (ref >90)
GFR, EST AFRICAN AMERICAN: 31 mL/min — AB (ref >90)
GFR, EST NON AFRICAN AMERICAN: 31 mL/min — AB (ref >90)
GLUCOSE: 95 mg/dL (ref 70–99)
Glucose, Bld: 147 mg/dL — ABNORMAL HIGH (ref 70–99)
PHOSPHORUS: 3.1 mg/dL (ref 2.3–4.6)
Phosphorus: 3.7 mg/dL (ref 2.3–4.6)
Potassium: 3.7 mEq/L (ref 3.7–5.3)
Potassium: 4 mEq/L (ref 3.7–5.3)
SODIUM: 135 meq/L — AB (ref 137–147)
Sodium: 133 mEq/L — ABNORMAL LOW (ref 137–147)

## 2014-08-12 LAB — GLUCOSE, CAPILLARY
Glucose-Capillary: 98 mg/dL (ref 70–99)
Glucose-Capillary: 140 mg/dL — ABNORMAL HIGH (ref 70–99)
Glucose-Capillary: 129 mg/dL — ABNORMAL HIGH (ref 70–99)
Glucose-Capillary: 243 mg/dL — ABNORMAL HIGH (ref 70–99)

## 2014-08-12 LAB — PROTEIN, BODY FLUID: Total protein, fluid: 2.1 g/dL

## 2014-08-12 LAB — APTT: APTT: 45 s — AB (ref 24–37)

## 2014-08-12 LAB — MAGNESIUM: MAGNESIUM: 2.1 mg/dL (ref 1.5–2.5)

## 2014-08-12 LAB — PROTIME-INR
INR: 1.5 — ABNORMAL HIGH (ref 0.00–1.49)
PROTHROMBIN TIME: 18.1 s — AB (ref 11.6–15.2)

## 2014-08-12 LAB — LACTATE DEHYDROGENASE, PLEURAL OR PERITONEAL FLUID: LD FL: 279 U/L — AB (ref 3–23)

## 2014-08-12 LAB — PREPARE PLATELET PHERESIS: UNIT DIVISION: 0

## 2014-08-12 LAB — CHOLESTEROL, TOTAL: Cholesterol: 125 mg/dL (ref 0–200)

## 2014-08-12 LAB — PROTEIN, TOTAL: Total Protein: 7.4 g/dL (ref 6.0–8.3)

## 2014-08-12 LAB — LACTATE DEHYDROGENASE
LDH: 617 U/L — ABNORMAL HIGH (ref 94–250)
LDH: 596 U/L — AB (ref 94–250)

## 2014-08-12 MED ORDER — SODIUM CHLORIDE 0.9 % IV SOLN
Freq: Once | INTRAVENOUS | Status: AC
  Administered 2014-08-12: 16:00:00 via INTRAVENOUS

## 2014-08-12 MED ORDER — POTASSIUM CHLORIDE CRYS ER 20 MEQ PO TBCR
40.0000 meq | EXTENDED_RELEASE_TABLET | Freq: Two times a day (BID) | ORAL | Status: DC
  Administered 2014-08-12 – 2014-08-13 (×3): 40 meq via ORAL
  Filled 2014-08-12 (×6): qty 2

## 2014-08-12 MED ORDER — ASPIRIN 81 MG PO CHEW
CHEWABLE_TABLET | ORAL | Status: AC
  Filled 2014-08-12: qty 1

## 2014-08-12 NOTE — Progress Notes (Addendum)
[facility] DAILY ICU PROGRESS NOTE                   [address]            [place] [address]          [phone]   12 Days Post-Op Procedure(s) (LRB): PERMANENT PACEMAKER INSERTION (N/A)  Total Length of Stay:  LOS: 37 days   Subjective: Feels about the same . CRRT has been started per nephrology  Objective: Vital signs in last 24 hours: Temp:  [97.5 F (36.4 C)-98.3 F (36.8 C)] 97.8 F (36.6 C) (09/09 0400) Pulse Rate:  [86-103] 93 (09/09 0700) Cardiac Rhythm:  [-] Ventricular paced (09/09 0400) Resp:  [16-27] 21 (09/09 0700) BP: (111-166)/(54-94) 136/72 mmHg (09/09 0700) SpO2:  [92 %-100 %] 96 % (09/09 0700) FiO2 (%):  [40 %] 40 % (09/08 1200) Weight:  [220 lb 14.4 oz (100.2 kg)] 220 lb 14.4 oz (100.2 kg) (09/09 0413)  Filed Weights   08/10/14 0500 08/11/14 0400 08/12/14 0413  Weight: 219 lb 6.4 oz (99.519 kg) 216 lb 7.9 oz (98.2 kg) 220 lb 14.4 oz (100.2 kg)    Weight change: 4 lb 6.5 oz (2 kg)   Hemodynamic parameters for last 24 hours: CVP:  [16 mmHg] 16 mmHg  Intake/Output from previous day: 09/08 0701 - 09/09 0700 In: 2172.7 [P.O.:120; I.V.:1452.7; Blood:350; IV Piggyback:250] Out: 8182 [Urine:2305]  Intake/Output this shift:    Current Meds: Scheduled Meds: . amiodarone  200 mg Oral BID  . ampicillin (OMNIPEN) IV  2 g Intravenous 3 times per day  . antiseptic oral rinse  7 mL Mouth Rinse BID  . aspirin EC  81 mg Oral Daily  . atorvastatin  10 mg Oral QPM  . cefTRIAXone (ROCEPHIN)  IV  2 g Intravenous Q12H  . darbepoetin (ARANESP) injection - NON-DIALYSIS  100 mcg Subcutaneous Q Wed-HD  . feeding supplement (ENSURE COMPLETE)  237 mL Oral BID BM  . folic acid  2 mg Oral Daily  . IMMUNE GLOBULIN 10% (HUMAN) IV - For Fluid Restriction Only  400 mg/kg Intravenous Q24H  . insulin aspart  0-15 Units Subcutaneous TID WC  . insulin aspart  0-5 Units Subcutaneous QHS  . metolazone  5 mg Oral BID  . pantoprazole  40 mg Oral Daily  .  potassium chloride  40 mEq Oral BID  . pyridOXINE  100 mg Oral QPM  . sodium chloride  10-40 mL Intracatheter Q12H  . vitamin B-12  1,000 mcg Oral QPM  . vitamin E  400 Units Oral Daily   Continuous Infusions: . DOPamine 3 mcg/kg/min (08/11/14 2306)  . furosemide (LASIX) infusion 30 mg/hr (08/12/14 0404)  . milrinone 0.25 mcg/kg/min (08/11/14 2306)  . dialysis replacement fluid (prismasate) 300 mL/hr at 08/12/14 0658  . dialysis replacement fluid (prismasate) 250 mL/hr at 08/11/14 1404  . dialysate (PRISMASATE) 1,000 mL/hr at 08/12/14 0517   PRN Meds:.acetaminophen, dextrose, heparin, levalbuterol, ondansetron (ZOFRAN) IV, ondansetron (ZOFRAN) IV, ondansetron, promethazine, sodium chloride, sodium chloride, traMADol, zolpidem  General appearance: cooperative, fatigued and mild distress Heart: regular rate and rhythm Lungs: dim in bases with some crackles Abdomen: soft, non tender, + BS Extremities: significant periph edema persists Wound: incis healing well  Lab Results: CBC: Recent Labs  08/11/14 0845 08/12/14 0410  WBC 7.4 6.7  HGB 10.4* 10.0*  HCT 31.4* 31.0*  PLT 29* 41*   BMET:  Recent Labs  08/11/14 1600 08/12/14 0410  NA 128*  133*  K 3.6* 3.7  CL 84* 91*  CO2 30 29  GLUCOSE 274* 95  BUN 66* 54*  CREATININE 1.73* 2.20*  CALCIUM 7.9* 8.2*    PT/INR:  Recent Labs  08/12/14 0410  LABPROT 18.1*  INR 1.50*    Radiology: Dg Chest Port 1 View  08/11/2014   CLINICAL DATA:  Hemodialysis catheter placement.  EXAM: PORTABLE CHEST - 1 VIEW  COMPARISON:  08/10/2014, 0449 hr  FINDINGS: Support apparatus: New LEFT IJ dialysis catheter is present with the distal tip in the mid SVC. RIGHT subclavian 2 lead cardiac pacemaker apparatus unchanged. LEFT upper extremity PICC is unchanged with the tip terminating in the upper SVC. Monitoring leads project over the chest.  Cardiomediastinal Silhouette:  Mildly enlarged, unchanged  Lungs: Perihilar and basilar predominant  airspace disease and atelectasis compatible with moderate volume overload. No pneumothorax.  Effusions:  Bilateral effusions are Moderate.  Other:  None.  IMPRESSION: 1. Uncomplicated new LEFT IJ dialysis catheter with the tip in the mid SVC. 2. Moderate volume overload with bilateral pleural effusions and basilar predominant pulmonary edema.   Electronically Signed   By: Dereck Ligas M.D.   On: 08/11/2014 13:15     Assessment/Plan: S/P Procedure(s) (LRB): PERMANENT PACEMAKER INSERTION (N/A)  1 right heart failure - cont milrinone/dop. Some HTN at times 2 now on CRRT for acute on chronic renal failure to assist with marked volume overload 3 CCM assisting with pulm management, hopefully can avoid intubation 4 platelets improving- hematology assisting, will need to determine coumadin dosing as INR is dropping. H/H is stable, no leukocytosis 5 cont current abx for endocarditis preop 6 maintaining paced rhythm   GOLD,WAYNE E 08/12/2014 7:39 AM  I have seen and examined the patient and agree with the assessment and plan as outlined.  Needs right thoracentesis or chest tube placement for moderately large right pleural effusion.    [name],[name] 08/12/2014 9:14 AM

## 2014-08-12 NOTE — Consult Note (Signed)
PULMONARY / CRITICAL CARE MEDICINE   Name: Justin [name] MRN: [id number] DOB: Apr 25, 1933    ADMISSION DATE:  07/18/2014 CONSULTATION DATE:  08/11/14  REFERRING MD :  Dr. Haroldine Laws   CHIEF COMPLAINT:  Volume overload, respiratory failure.   INITIAL PRESENTATION: 80 y/o M initially admitted 8/3 with dyspnea, fever.  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  Had CABG with AVR/MVR.  Developed acute respiratory failure and renal failure with hypervolemia.  PCCM consulted to assist with management of respiratory failure.  STUDIES:  8/03  CTA Chest >> no PE, bilateral effusions, extensive GGO, nodular opacities (largest 46mm) 8/04  ECHO >> EF 50-55%, Grade 2 DD, mod AI, mod-severe MR/TR, severe pulmonary HTN PA peak 65 8/06  CT ABD/Pelvis >> no source for bacteremia, anasarca, mod-large pleural effusions 8/06  CT Head >> negative for acute abnormality 8/11  TTE >> nml LV fxn, severe MR, large aortic valve vegetation w mod AI, no pacemaker vegetation identified 8/12  HIT >> neg  9/02  ECHO >> EF 50-55%, no wma, bioprosthesis of AV/MV, mod TR, PA peak 44  SIGNIFICANT EVENTS: 8/03  Admit for sepsis 8/11  Removal of Medtronic dual-chamber pacemaker secondary to enterococcal endoaortitis  8/14  LHC >> heavily calcified ostial circumflex with 70% stenosis, otherwise no critical stenosis 8/21  AVR, Aortic Root replacement, MVR, CABG x1, MAZE procedure per Dr. Roxy Manns 8/22  Extubation 8/28  Implantation of Medtronic dual-chamber pacemaker for symptomatic bradycardia / CHB 9/08  Worsening respiratory status, desaturations, tx to ICU, CVVHD started  Still distress  VITAL SIGNS: Temp:  [97.5 F (36.4 C)-98.3 F (36.8 C)] 97.6 F (36.4 C) (09/09 0750) Pulse Rate:  [86-103] 96 (09/09 0800) Resp:  [16-27] 22 (09/09 0800) BP: (111-166)/(54-94) 150/61 mmHg (09/09 0800) SpO2:  [92 %-100 %] 92 % (09/09 0800) FiO2 (%):  [40 %] 40 % (09/08 1200) Weight:  [100.2 kg (220 lb 14.4 oz)]  100.2 kg (220 lb 14.4 oz) (09/09 0413)  HEMODYNAMICS: CVP:  [16 mmHg] 16 mmHg  VENTILATOR SETTINGS: Vent Mode:  [-] BIPAP FiO2 (%):  [40 %] 40 % Set Rate:  [8 bmp] 8 bmp PEEP:  [5 cmH20] 5 cmH20 Pressure Support:  [7 cmH20] 7 cmH20 INTAKE / OUTPUT:  Intake/Output Summary (Last 24 hours) at 08/12/14 0936 Last data filed at 08/12/14 0900  Gross per 24 hour  Intake 2067.66 ml  Output   4806 ml  Net -2738.34 ml    PHYSICAL EXAMINATION: General: pleasant, increased WOB remains Neuro:  Alert, normal strength, CN intact HEENT:  jvd noted Cardiovascular:  s1 s2 Irregular, 2/6 SM Lungs:  B/l crackles, decreased BS at bases rt greater left Abdomen:  Soft, non tender, + bowel sounds Musculoskeletal:  3+ edema Skin:  No rashes  LABS:  CBC  Recent Labs Lab 08/10/14 1025 08/11/14 0845 08/12/14 0410  WBC 8.2 7.4 6.7  HGB 8.8* 10.4* 10.0*  HCT 27.3* 31.4* 31.0*  PLT 37* 29* 41*   Coag's  Recent Labs Lab 08/10/14 0507 08/11/14 0515 08/12/14 0410  APTT  --   --  45*  INR 2.10* 1.75* 1.50*   BMET  Recent Labs Lab 08/11/14 0515 08/11/14 1600 08/12/14 0410  NA 127* 128* 133*  K 3.5* 3.6* 3.7  CL 83* 84* 91*  CO2 29 30 29   BUN 68* 66* 54*  CREATININE 1.72* 1.73* 2.20*  GLUCOSE 247* 274* 95   Electrolytes  Recent Labs Lab 08/11/14 0515 08/11/14 1600 08/12/14 0410  CALCIUM 7.9*  7.9* 8.2*  MG  --   --  2.1  PHOS 5.2* 4.8* 3.7    ABG No results found for this basename: PHART, PCO2ART, PO2ART,  in the last 168 hours  Liver Enzymes  Recent Labs Lab 08/10/14 0507 08/11/14 0515 08/11/14 1600 08/12/14 0410  AST 54*  --   --   --   ALT 25  --   --   --   ALKPHOS 136*  --   --   --   BILITOT 0.3  --   --   --   ALBUMIN 2.0* 2.1* 2.2* 2.2*   Cardiac Enzymes No results found for this basename: TROPONINI, PROBNP,  in the last 168 hours Glucose  Recent Labs Lab 08/11/14 0637 08/11/14 0836 08/11/14 1145 08/11/14 1605 08/11/14 2143 08/12/14 0750   GLUCAP 111* 115* 103* 248* 83 98    Imaging Dg Chest Port 1 View  08/11/2014   CLINICAL DATA:  Hemodialysis catheter placement.  EXAM: PORTABLE CHEST - 1 VIEW  COMPARISON:  08/10/2014, 0449 hr  FINDINGS: Support apparatus: New LEFT IJ dialysis catheter is present with the distal tip in the mid SVC. RIGHT subclavian 2 lead cardiac pacemaker apparatus unchanged. LEFT upper extremity PICC is unchanged with the tip terminating in the upper SVC. Monitoring leads project over the chest.  Cardiomediastinal Silhouette:  Mildly enlarged, unchanged  Lungs: Perihilar and basilar predominant airspace disease and atelectasis compatible with moderate volume overload. No pneumothorax.  Effusions:  Bilateral effusions are Moderate.  Other:  None.  IMPRESSION: 1. Uncomplicated new LEFT IJ dialysis catheter with the tip in the mid SVC. 2. Moderate volume overload with bilateral pleural effusions and basilar predominant pulmonary edema.   Electronically Signed   By: Dereck Ligas M.D.   On: 08/11/2014 13:15     ASSESSMENT / PLAN:  PULMONARY A: Acute Hypoxic Respiratory Failure - in setting of volume overload / CHF / b/l pleural effusions. Right greater left effusions Hx of OSA on CPAP as outpt. P:   BIPAP is available prn, will discuss with patient role to use early I will [place] rt chest and assess consideration tap , therapeutic and diagnostic, unlikley to resolve with cvvhd and he remains with increase WOB Trend CXR in am Neg balance goals remain on cvvhd CPAP nocturnal   CARDIOVASCULAR PICC 8/28 >>  A:  CAD s/p CABG x1 with MVR/AVR - 8/21 per Dr. Roxy Manns.  PAF - controlled, not on anticoagulation. CHB s/p Pacemaker - 8/28 per Dr. Lovena Le. P:  Continue Amiodarone, ASA, Lipitor Dopamine consider dc Milrinone gtts per CHF SVC  RENAL A:   Acute Kidney Injury 2nd to cardiorenal syndrome- improved but remains volume overloaded. Hypokalemia. Hyponatremia in setting of hypervolemia.  P:   CVVHD to neg  balance goals Replace electrolytes as indicated  Consider dc lasix , dopamine infusions and zaraxylyn  GASTROINTESTINAL A:   Protein Calorie Malnutrition - in setting of critical illness. P:   diet and advancement PPI for SUP Follow lft with risk congestion  HEMATOLOGIC A:   Anemia of critical illness. Thrombocytopenia - concern for ITP, followed by Dr. Marin Olp.  HIT negative. P:  Monitor platelets If thora will Tx plat to goal 50 k  IVIG, Prednisone per Dr. Marin Olp  INFECTIOUS A:   Enterococcal Endocarditis. P:   BCx2 9/8 >> UC 9/8 >> Ampicillin, start date 8/08>>> Rocephin, start date 8/23>>> Will discuss stop dates with ID  ENDOCRINE A:   Hyperglycemia - in setting of critical illness +  steroid administration  P:   SSI   NEUROLOGIC A:   TIA 8/06 >> resolved. P:   Monitor mental status, supportive care  TODAY'S SUMMARY: will [place] chest and consider thora, consider dc dop, lasix while on cvvhd, NIMV prn, cpap nocturnal  Ccm time 30 min   Lavon Paganini. Titus Mould, MD, [place] Pgr: [place]

## 2014-08-12 NOTE — Progress Notes (Signed)
Justin [name] is doing okay. He had a dialysis catheter placed. What I can tell, there is no problems with bleeding. Apparently, he is now  on CVVH. He started this yesterday. He is doing okay with this.  His platelet count today is 41,000. He was transfused with platelets yesterday. His hemoglobin is 10.0.  He is on IVIG. He got a dose of Nplate.  I would that his blood smear. I really do not see anything that looked unusual. He had no issues with schistocytes. There were no related red cells. I saw no teardrop cells. He had normal white blood cells a. No immature myeloid cells were noted.  I may get him on some steroid. I realize that this may worsen his blood sugars. However, I did try to get his platelet count up.  He's had no obvious fever. So far all the cultures are negative.  He's had no diarrhea. He had no rashes.  On his physical exam, his vital signs show a temperature of 97.6. Pulse 96. Blood pressure 150/61. His head exam shows no ocular or oral lesions. He has no palatal petechia. She has no adenopathy in his neck. Lungs are clear. Cardiac exam regular in rhythm. He has 1/6 systolic ejection murmur. Abdomen is soft. Has good bowel sounds. There is no fluid wave. He is no palpable liver or spleen tip. Extremities shows no clubbing, cyanosis or edema. Neurological exam shows no focal neurological deficits.  For now, I feel we just have to watch and see how his blood counts trended. Hopefully, he will normalize at some point.  He is retaking appropriately. This hopefully, she'll be a good sign that his anemia will stabilize or maybe improve.  I still have a hard time understanding the recurrent thrombocytopenia. Again, when we first saw him a few months ago, I thought that the bone marrow was consistent with immune-based placenta previa. He had the endocarditis and I thought that the proximal IP was from his endocarditis. Was endocarditis got treated, split count normalized.  He is on  amiodarone. I know that this can sometimes lead to platelet count issues and marrow toxicity. Again it is hard to prove if this is a factor. The only what we would be a little no this is if the amiodarone was discontinued.   He is on the low dose aspirin. I don't see problems with him being on low-dose aspirin. I would keep him on this because of his recent bypass.  He is on folic acid and vitamin B 6.  I will be out of town for a couple days. If there are any issues that we need to address, please feel free to call the doctor on call.   Pete E.  Psalm 27:1

## 2014-08-12 NOTE — Procedures (Signed)
Thoracentesis Procedure Note- right  Pre-operative Diagnosis: large  effusion right, distress, s/p cardiac surgery, volume overload Post-operative Diagnosis: same  Indications: there and diag  Procedure Details  Consent: Informed consent was obtained. Risks of the procedure were discussed including: infection, bleeding, pain, pneumothorax.  Under sterile conditions the patient was positioned. Betadine solution and sterile drapes were utilized.  2% buffered lidocaine was used to anesthetize the 6th rib space. Fluid was obtained without any difficulties and minimal blood loss.  A dressing was applied to the wound and wound care instructions were provided.   Findings 1.350 ml of bloody pleural fluid was obtained. A sample was sent to Pathology for cytogenetics, flow, and cell counts, as well as for infection analysis.  Complications:  None; patient tolerated the procedure well.          Condition: stable  Plan A follow up chest x-ray was ordered. Bed Rest for 5 hours. Tylenol 650 mg. for pain.  Attending Attestation: I performed the procedure.  US guidance Plat Tx prior Tolerated well  Lavon Paganini. Titus Mould, MD, [place] Pgr: [facility]

## 2014-08-12 NOTE — Progress Notes (Signed)
PT Cancellation Note  Patient Details Name: Justin [name] MRN: [id number] DOB: 01/09/1933   Cancelled Treatment:    Reason Eval/Treat Not Completed: Medical issues which prohibited therapy. Per RN, pt on CVVHD and is to get a thoracentesis. Will continue to follow and see when medically appropriate.   Jolyn Lent 08/12/2014, 11:42 AM  Jolyn Lent, PT, DPT Acute Rehabilitation Services Pager: [phone]

## 2014-08-12 NOTE — Progress Notes (Signed)
Subjective:  Started CRRT - so far looks like 2.5 liters off- pt not feeling it yet- not surprising- BP is actually good/high Objective Vital signs in last 24 hours: Filed Vitals:   08/12/14 0500 08/12/14 0600 08/12/14 0700 08/12/14 0750  BP: 138/70 150/75 136/72   Pulse: 92 90 93   Temp:    97.6 F (36.4 C)  TempSrc:    Oral  Resp: 22 17 21    Height:      Weight:      SpO2: 95% 93% 96%    Weight change: 2 kg (4 lb 6.5 oz)  Intake/Output Summary (Last 24 hours) at 08/12/14 0801 Last data filed at 08/12/14 0700  Gross per 24 hour  Intake 2120.16 ml  Output   4416 ml  Net -2295.84 ml    Assessment/ Plan: Pt is a 80 y.o. yo male who was admitted on 07/27/2014 with A on CRF  s/p complicated cardiac surgey with complications  Assessment/Plan: 1. Renal- so with better renal perfusion making pretty good urine but still not able to get a negative fluid balance so CRRT was started on 9/8. He is tolerating this well- will increase removal per hour to 200 ml- with instructions to increase further if tolerates. 2. HTN/vol- will continue diuretics as is making urine with CRRT 3. Anemia- gave aranesp- s/p transfusion- stable 4. Hypokalemia- repleting and on 4 K bath 5. Hyponatremia- due to volume overload- improved 6. Cardiac- on milrinone/dopamine- hemodynamics are improved- per cards 7. Thrombocytopenia- improved on IVIG  Deshawn Witty A       Labs: Basic Metabolic Panel:  Recent Labs Lab 08/11/14 0515 08/11/14 1600 08/12/14 0410  NA 127* 128* 133*  K 3.5* 3.6* 3.7  CL 83* 84* 91*  CO2 29 30 29   GLUCOSE 247* 274* 95  BUN 68* 66* 54*  CREATININE 1.72* 1.73* 2.20*  CALCIUM 7.9* 7.9* 8.2*  PHOS 5.2* 4.8* 3.7   Liver Function Tests:  Recent Labs Lab 08/10/14 0507 08/11/14 0515 08/11/14 1600 08/12/14 0410  AST 54*  --   --   --   ALT 25  --   --   --   ALKPHOS 136*  --   --   --   BILITOT 0.3  --   --   --   PROT 5.8*  --   --   --   ALBUMIN 2.0* 2.1* 2.2*  2.2*   No results found for this basename: LIPASE, AMYLASE,  in the last 168 hours No results found for this basename: AMMONIA,  in the last 168 hours CBC:  Recent Labs Lab 08/08/14 0453 08/10/14 0507 08/10/14 1025 08/11/14 0845 08/12/14 0410  WBC 10.5 9.8 8.2 7.4 6.7  HGB 9.0* 8.1* 8.8* 10.4* 10.0*  HCT 28.4* 25.5* 27.3* 31.4* 31.0*  MCV 103.3* 102.8* 101.5* 97.2 97.8  PLT 112* 46* 37* 29* 41*   Cardiac Enzymes: No results found for this basename: CKTOTAL, CKMB, CKMBINDEX, TROPONINI,  in the last 168 hours CBG:  Recent Labs Lab 08/11/14 0637 08/11/14 0836 08/11/14 1145 08/11/14 1605 08/11/14 2143  GLUCAP 111* 115* 103* 248* 83    Iron Studies:   Recent Labs  08/10/14 0800  IRON 47  TIBC 219  FERRITIN 1485*   Studies/Results: Dg Chest Port 1 View  08/12/2014   CLINICAL DATA:  CHF, wheezing, shortness of breath  EXAM: PORTABLE CHEST - 1 VIEW  COMPARISON:  08/11/2014; 08/10/2014; 08/03/2014  FINDINGS: Grossly unchanged enlarged cardiac silhouette and mediastinal contours with atherosclerotic  plaque within the thoracic aorta. Post median sternotomy, CABG and aortic valve replacement. Stable position of support apparatus. Minimally improved aeration of the lungs with persistent cephalization of flow. Unchanged layering effusions and bibasilar opacities, left greater than right, atelectasis versus infiltrate. No new focal airspace opacities. Unchanged bones.  IMPRESSION: 1.  Stable positioning of support apparatus.  No pneumothorax. 2. Minimally improved aeration of lungs with persistent findings of pulmonary venous congestion, layering bilateral effusions and associated bibasilar opacities, atelectasis versus infiltrate.   Electronically Signed   By: Sandi Mariscal M.D.   On: 08/12/2014 07:39   Dg Chest Port 1 View  08/11/2014   CLINICAL DATA:  Hemodialysis catheter placement.  EXAM: PORTABLE CHEST - 1 VIEW  COMPARISON:  08/10/2014, 0449 hr  FINDINGS: Support apparatus: New LEFT  IJ dialysis catheter is present with the distal tip in the mid SVC. RIGHT subclavian 2 lead cardiac pacemaker apparatus unchanged. LEFT upper extremity PICC is unchanged with the tip terminating in the upper SVC. Monitoring leads project over the chest.  Cardiomediastinal Silhouette:  Mildly enlarged, unchanged  Lungs: Perihilar and basilar predominant airspace disease and atelectasis compatible with moderate volume overload. No pneumothorax.  Effusions:  Bilateral effusions are Moderate.  Other:  None.  IMPRESSION: 1. Uncomplicated new LEFT IJ dialysis catheter with the tip in the mid SVC. 2. Moderate volume overload with bilateral pleural effusions and basilar predominant pulmonary edema.   Electronically Signed   By: Dereck Ligas M.D.   On: 08/11/2014 13:15   Medications: Infusions: . DOPamine 3 mcg/kg/min (08/11/14 2306)  . furosemide (LASIX) infusion 30 mg/hr (08/12/14 0404)  . milrinone 0.25 mcg/kg/min (08/11/14 2306)  . dialysis replacement fluid (prismasate) 300 mL/hr at 08/12/14 0658  . dialysis replacement fluid (prismasate) 250 mL/hr at 08/11/14 1404  . dialysate (PRISMASATE) 1,000 mL/hr at 08/12/14 0517    Scheduled Medications: . amiodarone  200 mg Oral BID  . ampicillin (OMNIPEN) IV  2 g Intravenous 3 times per day  . antiseptic oral rinse  7 mL Mouth Rinse BID  . aspirin EC  81 mg Oral Daily  . atorvastatin  10 mg Oral QPM  . cefTRIAXone (ROCEPHIN)  IV  2 g Intravenous Q12H  . darbepoetin (ARANESP) injection - NON-DIALYSIS  100 mcg Subcutaneous Q Wed-HD  . feeding supplement (ENSURE COMPLETE)  237 mL Oral BID BM  . folic acid  2 mg Oral Daily  . IMMUNE GLOBULIN 10% (HUMAN) IV - For Fluid Restriction Only  400 mg/kg Intravenous Q24H  . insulin aspart  0-15 Units Subcutaneous TID WC  . insulin aspart  0-5 Units Subcutaneous QHS  . metolazone  5 mg Oral BID  . pantoprazole  40 mg Oral Daily  . potassium chloride  40 mEq Oral BID  . pyridOXINE  100 mg Oral QPM  . sodium  chloride  10-40 mL Intracatheter Q12H  . vitamin B-12  1,000 mcg Oral QPM  . vitamin E  400 Units Oral Daily    have reviewed scheduled and prn medications.  Physical Exam: General: alert, NAD Heart: RRR Lungs: decreased BS at bases Abdomen: distended Extremities: pitting edema    08/12/2014,8:01 AM  LOS: 37 days

## 2014-08-12 NOTE — Procedures (Signed)
[place] chest bilat  1. Large bilateral effusions, no fibrinous material, not loculated 2. smal lung flaps  Will thora rt, likely will need left in am   Lavon Paganini. Titus Mould, MD, [place] Pgr: [facility]

## 2014-08-12 NOTE — Progress Notes (Signed)
    Subjective:   Now on CVVHD. Remains dyspneic. Planning for thoracentesis today.   Platelets 112k-> 37k-> 29k-> 41k . On IVIG and NPlate.  No active bleeding   Objective:  Filed Vitals:   08/12/14 1300 08/12/14 1400 08/12/14 1500 08/12/14 1526  BP: 112/56 129/54 107/58   Pulse: 91 89 89 91  Temp:    98.9 F (37.2 C)  TempSrc:    Oral  Resp: 21 23 28 25   Height:      Weight:      SpO2: 97% 98% 99% 97%    Intake/Output from previous day:  Intake/Output Summary (Last 24 hours) at 08/12/14 1529 Last data filed at 08/12/14 1500  Gross per 24 hour  Intake 2010.26 ml  Output   5820 ml  Net -3809.74 ml    Physical Exam: Physical exam:  Dyspneic and pale Skin is warm and dry.  HEENT is normal.  Neck is supple. JVP up LIJ trialysis Chest bilateral rhonchi . Pacemaker site with no hematoma Cardiovascular exam is regular rate and rhythm. 2/6 systolic murmur Abdominal exam nontender or distended. No masses palpated. Extremities show 3-4+ edema into thigh. LEs wrapped neuro grossly intact    Lab Results: Basic Metabolic Panel:  Recent Labs  08/11/14 1600 08/12/14 0410  NA 128* 133*  K 3.6* 3.7  CL 84* 91*  CO2 30 29  GLUCOSE 274* 95  BUN 66* 54*  CREATININE 1.73* 2.20*  CALCIUM 7.9* 8.2*  MG  --  2.1  PHOS 4.8* 3.7   CBC:  Recent Labs  08/11/14 0845 08/12/14 0410  WBC 7.4 6.7  HGB 10.4* 10.0*  HCT 31.4* 31.0*  MCV 97.2 97.8  PLT 29* 41*     Assessment/Plan:  1 status post aortic root replacement, aortic valve replacement, mitral valve replacement, coronary artery bypassing graft and maze-Continue rehab. 2 acute on chronic kidney failure-nephrology following. Significantly volume overloaded. 3 thrombocytopenia-improved. Hematology following. 4 paroxysmal atrial fibrillation-patient is in AV paced rhythm today. Continue amiodarone and Coumadin. 5 CAD-continue aspirin and statin. 6 status post pacemaker 7 endocarditis-continue antibiotics; will  need fu with ID 8 Volume excess/right heart failure- 9 Acute respiratory failure  He remains on CVVHD. Volume status still up. Plan for thoracentesis today. Will continue milrinone as tolerated.   Ruthie Berch,MD 3:29 PM

## 2014-08-12 NOTE — Progress Notes (Signed)
TCTS BRIEF PROGRESS NOTE  Feels much better after thoracentesis...  Plan: Continue current plan.    Coumadin was stopped several days ago without a specific plan, presumably due to severe thrombocytopenia.  Given that platelet count has remained low I would favor holding off on therapeutic anticoagulation with heparin or lovenox.  However, use of heparin in the CVVHD circuit seems reasonable, although we will need to continue to monitor platelet count closely.  Conversely, it's probably wise to hold off on restarting coumadin since Mr Cardarelli may require repeat thoracentesis and/or Pleur-X catheter placement at some point.    Rexene Alberts 08/12/2014 6:57 PM

## 2014-08-13 ENCOUNTER — Inpatient Hospital Stay (HOSPITAL_COMMUNITY): Payer: Medicare Other

## 2014-08-13 DIAGNOSIS — N186 End stage renal disease: Secondary | ICD-10-CM

## 2014-08-13 DIAGNOSIS — B49 Unspecified mycosis: Secondary | ICD-10-CM

## 2014-08-13 LAB — URINE MICROSCOPIC-ADD ON

## 2014-08-13 LAB — PREPARE PLATELET PHERESIS: Unit division: 0

## 2014-08-13 LAB — BODY FLUID CELL COUNT WITH DIFFERENTIAL
Eos, Fluid: 0 %
Lymphs, Fluid: 54 %
Monocyte-Macrophage-Serous Fluid: 12 % — ABNORMAL LOW (ref 50–90)
NEUTROPHIL FLUID: 34 % — AB (ref 0–25)
WBC FLUID: 401 uL (ref 0–1000)

## 2014-08-13 LAB — RENAL FUNCTION PANEL
Albumin: 2.2 g/dL — ABNORMAL LOW (ref 3.5–5.2)
Albumin: 2.1 g/dL — ABNORMAL LOW (ref 3.5–5.2)
Anion gap: 11 (ref 5–15)
Anion gap: 13 (ref 5–15)
BUN: 37 mg/dL — ABNORMAL HIGH (ref 6–23)
BUN: 32 mg/dL — AB (ref 6–23)
CALCIUM: 8 mg/dL — AB (ref 8.4–10.5)
CALCIUM: 7.9 mg/dL — AB (ref 8.4–10.5)
CHLORIDE: 97 meq/L (ref 96–112)
CO2: 27 mEq/L (ref 19–32)
CO2: 25 mEq/L (ref 19–32)
CREATININE: 2.03 mg/dL — AB (ref 0.50–1.35)
CREATININE: 2.26 mg/dL — AB (ref 0.50–1.35)
Chloride: 96 mEq/L (ref 96–112)
GFR calc Af Amer: 30 mL/min — ABNORMAL LOW (ref >90)
GFR calc non Af Amer: 29 mL/min — ABNORMAL LOW (ref >90)
GFR, EST AFRICAN AMERICAN: 34 mL/min — AB (ref >90)
GFR, EST NON AFRICAN AMERICAN: 26 mL/min — AB (ref >90)
Glucose, Bld: 96 mg/dL (ref 70–99)
Glucose, Bld: 118 mg/dL — ABNORMAL HIGH (ref 70–99)
PHOSPHORUS: 2.6 mg/dL (ref 2.3–4.6)
Phosphorus: 2.3 mg/dL (ref 2.3–4.6)
Potassium: 4.4 mEq/L (ref 3.7–5.3)
Potassium: 5 mEq/L (ref 3.7–5.3)
SODIUM: 134 meq/L — AB (ref 137–147)
Sodium: 135 mEq/L — ABNORMAL LOW (ref 137–147)

## 2014-08-13 LAB — PROTIME-INR
INR: 1.42 (ref 0.00–1.49)
PROTHROMBIN TIME: 17.4 s — AB (ref 11.6–15.2)

## 2014-08-13 LAB — CBC
HCT: 30 % — ABNORMAL LOW (ref 39.0–52.0)
Hemoglobin: 9.4 g/dL — ABNORMAL LOW (ref 13.0–17.0)
MCH: 31.3 pg (ref 26.0–34.0)
MCHC: 31.3 g/dL (ref 30.0–36.0)
MCV: 100 fL (ref 78.0–100.0)
PLATELETS: 64 10*3/uL — AB (ref 150–400)
RBC: 3 MIL/uL — ABNORMAL LOW (ref 4.22–5.81)
RDW: 23.6 % — ABNORMAL HIGH (ref 11.5–15.5)
WBC: 6.3 10*3/uL (ref 4.0–10.5)

## 2014-08-13 LAB — URINALYSIS, ROUTINE W REFLEX MICROSCOPIC
GLUCOSE, UA: NEGATIVE mg/dL
Ketones, ur: 15 mg/dL — AB
Nitrite: NEGATIVE
PH: 5 (ref 5.0–8.0)
Protein, ur: 100 mg/dL — AB
Specific Gravity, Urine: 1.036 — ABNORMAL HIGH (ref 1.005–1.030)
Urobilinogen, UA: 0.2 mg/dL (ref 0.0–1.0)

## 2014-08-13 LAB — MAGNESIUM: Magnesium: 2.2 mg/dL (ref 1.5–2.5)

## 2014-08-13 LAB — GLUCOSE, CAPILLARY
GLUCOSE-CAPILLARY: 97 mg/dL (ref 70–99)
Glucose-Capillary: 147 mg/dL — ABNORMAL HIGH (ref 70–99)
Glucose-Capillary: 134 mg/dL — ABNORMAL HIGH (ref 70–99)
Glucose-Capillary: 134 mg/dL — ABNORMAL HIGH (ref 70–99)

## 2014-08-13 LAB — APTT: APTT: 46 s — AB (ref 24–37)

## 2014-08-13 LAB — PH, BODY FLUID: pH, Fluid: 8.5

## 2014-08-13 LAB — CARBOXYHEMOGLOBIN
CARBOXYHEMOGLOBIN: 2.2 % — AB (ref 0.5–1.5)
METHEMOGLOBIN: 0.3 % (ref 0.0–1.5)
O2 Saturation: 58 %
TOTAL HEMOGLOBIN: 9.7 g/dL — AB (ref 13.5–18.0)

## 2014-08-13 LAB — EXPECTORATED SPUTUM ASSESSMENT W GRAM STAIN, RFLX TO RESP C

## 2014-08-13 LAB — LACTATE DEHYDROGENASE: LDH: 593 U/L — ABNORMAL HIGH (ref 94–250)

## 2014-08-13 LAB — EXPECTORATED SPUTUM ASSESSMENT W REFEX TO RESP CULTURE

## 2014-08-13 LAB — RETICULOCYTES
RBC.: 3 MIL/uL — AB (ref 4.22–5.81)
RETIC COUNT ABSOLUTE: 186 10*3/uL (ref 19.0–186.0)
Retic Ct Pct: 6.2 % — ABNORMAL HIGH (ref 0.4–3.1)

## 2014-08-13 MED ORDER — AMIODARONE HCL IN DEXTROSE 360-4.14 MG/200ML-% IV SOLN: 60.0000 mg/h | INTRAVENOUS | Status: DC

## 2014-08-13 MED ORDER — AMIODARONE HCL IN DEXTROSE 360-4.14 MG/200ML-% IV SOLN
30.0000 mg/h | INTRAVENOUS | Status: DC
  Administered 2014-08-13 – 2014-08-18 (×10): 30 mg/h via INTRAVENOUS
  Filled 2014-08-13 (×22): qty 200

## 2014-08-13 MED ORDER — LEVALBUTEROL TARTRATE 45 MCG/ACT IN AERO: 1.0000 | INHALATION_SPRAY | Freq: Four times a day (QID) | RESPIRATORY_TRACT | Status: DC | PRN

## 2014-08-13 MED ORDER — IPRATROPIUM BROMIDE 0.02 % IN SOLN
0.5000 mg | RESPIRATORY_TRACT | Status: DC
  Administered 2014-08-13 – 2014-08-18 (×19): 0.5 mg via RESPIRATORY_TRACT
  Filled 2014-08-13 (×21): qty 2.5

## 2014-08-13 MED ORDER — IPRATROPIUM BROMIDE HFA 17 MCG/ACT IN AERS: 2.0000 | INHALATION_SPRAY | RESPIRATORY_TRACT | Status: DC

## 2014-08-13 MED ORDER — FUROSEMIDE 10 MG/ML IJ SOLN
40.0000 mg | Freq: Once | INTRAMUSCULAR | Status: AC
  Administered 2014-08-13: 40 mg via INTRAVENOUS
  Filled 2014-08-13: qty 4

## 2014-08-13 MED ORDER — SODIUM CHLORIDE 0.9 % IV SOLN: 100.0000 mg | Freq: Every day | INTRAVENOUS | Status: DC

## 2014-08-13 MED ORDER — AMIODARONE HCL IN DEXTROSE 360-4.14 MG/200ML-% IV SOLN
30.0000 mg/h | INTRAVENOUS | Status: DC
  Administered 2014-08-13: 30 mg/h via INTRAVENOUS
  Filled 2014-08-13: qty 200

## 2014-08-13 MED ORDER — FLUCONAZOLE 100MG IVPB
100.0000 mg | INTRAVENOUS | Status: DC
  Administered 2014-08-13: 100 mg via INTRAVENOUS
  Filled 2014-08-13: qty 50

## 2014-08-13 MED ORDER — SODIUM CHLORIDE 0.9 % IV SOLN
100.0000 mg | INTRAVENOUS | Status: DC
  Administered 2014-08-13 – 2014-08-14 (×2): 100 mg via INTRAVENOUS
  Filled 2014-08-13 (×3): qty 100

## 2014-08-13 NOTE — Procedures (Signed)
Thoracentesis Procedure Note  Pre-operative Diagnosis: pleural effusion   Post-operative Diagnosis: same  Indications: therapeutic   Procedure Details  Consent: Informed consent was obtained. Risks of the procedure were discussed including: infection, bleeding, pain, pneumothorax.  Under sterile conditions the patient was positioned. Betadine solution and sterile drapes were utilized.  1% plain lidocaine was used to anesthetize the 6th rib space. Fluid was obtained without any difficulties and minimal blood loss.  A dressing was applied to the wound and wound care instructions were provided.   Findings 1050 ml of serosanginous pleural fluid was obtained. A sample was sent to Pathology for cytogenetics, flow, and cell counts, as well as for infection analysis.  Complications:  None; patient tolerated the procedure well.          Condition: stable  Plan A follow up chest x-ray was ordered. Bed Rest for 2 hours.  Attending Attestation: I performed the procedure.     US guidance Tolerated well  Lavon Paganini. Titus Mould, MD, [place] Pgr: [facility]

## 2014-08-13 NOTE — Progress Notes (Addendum)
PT Cancellation Note  Patient Details Name: Justin [name] MRN: [id number] DOB: January 08, 1933   Cancelled Treatment:    Reason Eval/Treat Not Completed: Medical issues which prohibited therapy (pt on CRRT s/p thoracentesis and to continue for several days. Will hold and check back Monday for medical stability and appropriateness). Discussed with RN and Dr.Feinstein   Lanetta Inch Beth 08/13/2014, 11:34 AM Elwyn Reach, [place]

## 2014-08-13 NOTE — Progress Notes (Addendum)
TCTS DAILY ICU PROGRESS NOTE                   [address]            [place] [address]          [phone]   13 Days Post-Op Procedure(s) (LRB): PERMANENT PACEMAKER INSERTION (N/A)  Total Length of Stay:  LOS: 38 days   Subjective: Feels better  Objective: Vital signs in last 24 hours: Temp:  [97.6 F (36.4 C)-99.1 F (37.3 C)] 98.4 F (36.9 C) (09/10 0400) Pulse Rate:  [85-122] 116 (09/10 0700) Cardiac Rhythm:  [-] Ventricular paced (09/09 2000) Resp:  [18-29] 22 (09/10 0700) BP: (90-150)/(49-99) 121/63 mmHg (09/10 0700) SpO2:  [92 %-100 %] 95 % (09/10 0700) Weight:  [196 lb 10.4 oz (89.2 kg)] 196 lb 10.4 oz (89.2 kg) (09/10 0500)  Filed Weights   08/11/14 0400 08/12/14 0413 08/13/14 0500  Weight: 216 lb 7.9 oz (98.2 kg) 220 lb 14.4 oz (100.2 kg) 196 lb 10.4 oz (89.2 kg)    Weight change: -24 lb 4 oz (-11 kg)   Hemodynamic parameters for last 24 hours: CVP:  [11 mmHg-24 mmHg] 15 mmHg  Intake/Output from previous day: 09/09 0701 - 09/10 0700 In: 2388.2 [P.O.:1020; I.V.:728.2; Blood:390; IV Piggyback:250] Out: 7342 [Urine:540]  Intake/Output this shift:    Current Meds: Scheduled Meds: . amiodarone  200 mg Oral BID  . ampicillin (OMNIPEN) IV  2 g Intravenous 3 times per day  . antiseptic oral rinse  7 mL Mouth Rinse BID  . aspirin EC  81 mg Oral Daily  . atorvastatin  10 mg Oral QPM  . cefTRIAXone (ROCEPHIN)  IV  2 g Intravenous Q12H  . darbepoetin (ARANESP) injection - NON-DIALYSIS  100 mcg Subcutaneous Q Wed-HD  . feeding supplement (ENSURE COMPLETE)  237 mL Oral BID BM  . folic acid  2 mg Oral Daily  . IMMUNE GLOBULIN 10% (HUMAN) IV - For Fluid Restriction Only  400 mg/kg Intravenous Q24H  . insulin aspart  0-15 Units Subcutaneous TID WC  . insulin aspart  0-5 Units Subcutaneous QHS  . pantoprazole  40 mg Oral Daily  . potassium chloride  40 mEq Oral BID  . pyridOXINE  100 mg Oral QPM  . sodium chloride  10-40 mL Intracatheter  Q12H  . vitamin B-12  1,000 mcg Oral QPM  . vitamin E  400 Units Oral Daily   Continuous Infusions: . milrinone 0.25 mcg/kg/min (08/12/14 1553)  . dialysis replacement fluid (prismasate) 300 mL/hr at 08/12/14 2358  . dialysis replacement fluid (prismasate) 250 mL/hr at 08/11/14 1404  . dialysate (PRISMASATE) 1,000 mL/hr at 08/13/14 0300   PRN Meds:.acetaminophen, heparin, levalbuterol, ondansetron (ZOFRAN) IV, ondansetron (ZOFRAN) IV, ondansetron, promethazine, sodium chloride, sodium chloride, traMADol, zolpidem  General appearance: alert, cooperative and mild distress Heart: irregularly irregular rhythm Lungs: rales in right base, dim BS in left base Abdomen: benign Extremities: significant edema persists Wound: incis healing well  Lab Results: CBC: Recent Labs  08/12/14 0410 08/13/14 0410  WBC 6.7 6.3  HGB 10.0* 9.4*  HCT 31.0* 30.0*  PLT 41* 64*   BMET:  Recent Labs  08/12/14 1600 08/13/14 0410  NA 135* 134*  K 4.0 4.4  CL 94* 96  CO2 28 27  GLUCOSE 147* 96  BUN 42* 37*  CREATININE 1.97* 2.03*  CALCIUM 8.2* 8.0*    PT/INR:  Recent Labs  08/13/14 0410  LABPROT 17.4*  INR 1.42  Radiology: Dg Chest Port 1 View  08/12/2014   CLINICAL DATA:  Post right thoracentesis  EXAM: PORTABLE CHEST - 1 VIEW  COMPARISON:  08/12/2014  FINDINGS: Interval decrease in right pleural effusion following thoracentesis. No pneumothorax  Moderate left effusion and left lower lobe infiltrate unchanged.  Improved aeration in the right lung base  Left jugular central venous catheter tips in the SVC also unchanged. Prior heart surgery and pacemaker placement.  IMPRESSION: Negative for pneumothorax post right thoracentesis. Improved aeration right lung base  Left pleural effusion and left lower lobe infiltrate unchanged.   Electronically Signed   By: Franchot Gallo M.D.   On: 08/12/2014 16:50   Dg Chest Port 1 View  08/12/2014   CLINICAL DATA:  CHF, wheezing, shortness of breath  EXAM:  PORTABLE CHEST - 1 VIEW  COMPARISON:  08/11/2014; 08/10/2014; 08/03/2014  FINDINGS: Grossly unchanged enlarged cardiac silhouette and mediastinal contours with atherosclerotic plaque within the thoracic aorta. Post median sternotomy, CABG and aortic valve replacement. Stable position of support apparatus. Minimally improved aeration of the lungs with persistent cephalization of flow. Unchanged layering effusions and bibasilar opacities, left greater than right, atelectasis versus infiltrate. No new focal airspace opacities. Unchanged bones.  IMPRESSION: 1.  Stable positioning of support apparatus.  No pneumothorax. 2. Minimally improved aeration of lungs with persistent findings of pulmonary venous congestion, layering bilateral effusions and associated bibasilar opacities, atelectasis versus infiltrate.   Electronically Signed   By: Sandi Mariscal M.D.   On: 08/12/2014 07:39   Dg Chest Port 1 View  08/11/2014   CLINICAL DATA:  Hemodialysis catheter placement.  EXAM: PORTABLE CHEST - 1 VIEW  COMPARISON:  08/10/2014, 0449 hr  FINDINGS: Support apparatus: New LEFT IJ dialysis catheter is present with the distal tip in the mid SVC. RIGHT subclavian 2 lead cardiac pacemaker apparatus unchanged. LEFT upper extremity PICC is unchanged with the tip terminating in the upper SVC. Monitoring leads project over the chest.  Cardiomediastinal Silhouette:  Mildly enlarged, unchanged  Lungs: Perihilar and basilar predominant airspace disease and atelectasis compatible with moderate volume overload. No pneumothorax.  Effusions:  Bilateral effusions are Moderate.  Other:  None.  IMPRESSION: 1. Uncomplicated new LEFT IJ dialysis catheter with the tip in the mid SVC. 2. Moderate volume overload with bilateral pleural effusions and basilar predominant pulmonary edema.   Electronically Signed   By: Dereck Ligas M.D.   On: 08/11/2014 13:15     Assessment/Plan: S/P Procedure(s) (LRB): PERMANENT PACEMAKER INSERTION (N/A)  1 creat  trend improving, CRRT has removed significant amt of fluid . -5400 balance yesterday 2 H/H is stable, platelets cont to improve. No leukocytosis or fevers 3 See Dr Ricard Dillon note re: anticoagulation 4 feels relief from thoracentesis with less Dyspnea, + prod cough with upper airway wheeze- CCM following 5 conts milrinone 6 conts current abx- will check sputum cx 7 paced rhythm- but in and out of afib now, on amiodarone  GOLD,WAYNE E 08/13/2014 7:34 AM   I have seen and examined the patient and agree with the assessment and plan as outlined.  Breathing much improved since thoracentesis.  Overall he looks somewhat better over the last few days.  Unfortunately, urine culture and 1 of 2 sets of blood cultures from 9/8 growing yeast, presumably catheter-related infection.  Hopefully this will clear with antibiotics and removal of Foley catheter.  I would not favor pursuing TEE at this time as Mr Depaul is frail enough that he might require intubation for the procedure.  Perhaps his recurrent thrombocytopenia is related to fungal sepsis and not due to ITP.  His platelet count remained normal for an extended period of time after his valve surgery.  I would be reluctant to add Prednisone at this time, as had been suggested by Dr Marin Olp.  [name],[name] 08/13/2014 5:14 PM

## 2014-08-13 NOTE — Progress Notes (Signed)
Subjective:  Doing well with CRRT- negative 5400 the last 24 hours- UOP dropped off- also had thoracentesis- feels better  Objective Vital signs in last 24 hours: Filed Vitals:   08/13/14 0500 08/13/14 0600 08/13/14 0700 08/13/14 0730  BP: 119/55 107/74 121/63   Pulse: 108  116   Temp:    97.8 F (36.6 C)  TempSrc:    Oral  Resp: 27 21 22    Height:      Weight: 89.2 kg (196 lb 10.4 oz)     SpO2: 96% 95% 95%    Weight change: -11 kg (-24 lb 4 oz)  Intake/Output Summary (Last 24 hours) at 08/13/14 0758 Last data filed at 08/13/14 0700  Gross per 24 hour  Intake 2388.2 ml  Output   7789 ml  Net -5400.8 ml    Assessment/ Plan: Pt is a 80 y.o. yo male who was admitted on 07/16/2014 with A on CRF  s/p complicated cardiac surgey with complications  Assessment/Plan: 1. Renal- so with better renal perfusion making pretty good urine but still not able to get a negative fluid balance so CRRT was started on 9/8. He is tolerating this well- will increase removal per hour to 250 ml-  2. HTN/vol- had continued diuretics as was making urine but now they are stopped 3. Anemia- gave aranesp- s/p transfusion- stable 4. Hypokalemia- repleting and on 4 K bath 5. Hyponatremia- due to volume overload- improved 6. Cardiac- on milrinone/dopamine- hemodynamics are improved- per cards 7. Thrombocytopenia- improved on IVIG  Justin [name] A       Labs: Basic Metabolic Panel:  Recent Labs Lab 08/12/14 0410 08/12/14 1600 08/13/14 0410  NA 133* 135* 134*  K 3.7 4.0 4.4  CL 91* 94* 96  CO2 29 28 27   GLUCOSE 95 147* 96  BUN 54* 42* 37*  CREATININE 2.20* 1.97* 2.03*  CALCIUM 8.2* 8.2* 8.0*  PHOS 3.7 3.1 2.6   Liver Function Tests:  Recent Labs Lab 08/10/14 0507  08/12/14 0410 08/12/14 1600 08/13/14 0410  AST 54*  --   --   --   --   ALT 25  --   --   --   --   ALKPHOS 136*  --   --   --   --   BILITOT 0.3  --   --   --   --   PROT 5.8*  --   --  7.4  --   ALBUMIN 2.0*  < >  2.2* 2.2* 2.2*  < > = values in this interval not displayed. No results found for this basename: LIPASE, AMYLASE,  in the last 168 hours No results found for this basename: AMMONIA,  in the last 168 hours CBC:  Recent Labs Lab 08/10/14 0507 08/10/14 1025 08/11/14 0845 08/12/14 0410 08/13/14 0410  WBC 9.8 8.2 7.4 6.7 6.3  HGB 8.1* 8.8* 10.4* 10.0* 9.4*  HCT 25.5* 27.3* 31.4* 31.0* 30.0*  MCV 102.8* 101.5* 97.2 97.8 100.0  PLT 46* 37* 29* 41* 64*   Cardiac Enzymes: No results found for this basename: CKTOTAL, CKMB, CKMBINDEX, TROPONINI,  in the last 168 hours CBG:  Recent Labs Lab 08/11/14 2143 08/12/14 0750 08/12/14 1254 08/12/14 1632 08/12/14 2139  GLUCAP 83 98 140* 129* 243*    Iron Studies:   Recent Labs  08/10/14 0800  IRON 47  TIBC 219  FERRITIN 1485*   Studies/Results: Dg Chest Port 1 View  08/12/2014   CLINICAL DATA:  Post right thoracentesis  EXAM:  PORTABLE CHEST - 1 VIEW  COMPARISON:  08/12/2014  FINDINGS: Interval decrease in right pleural effusion following thoracentesis. No pneumothorax  Moderate left effusion and left lower lobe infiltrate unchanged.  Improved aeration in the right lung base  Left jugular central venous catheter tips in the SVC also unchanged. Prior heart surgery and pacemaker placement.  IMPRESSION: Negative for pneumothorax post right thoracentesis. Improved aeration right lung base  Left pleural effusion and left lower lobe infiltrate unchanged.   Electronically Signed   By: Franchot Gallo M.D.   On: 08/12/2014 16:50   Dg Chest Port 1 View  08/12/2014   CLINICAL DATA:  CHF, wheezing, shortness of breath  EXAM: PORTABLE CHEST - 1 VIEW  COMPARISON:  08/11/2014; 08/10/2014; 08/03/2014  FINDINGS: Grossly unchanged enlarged cardiac silhouette and mediastinal contours with atherosclerotic plaque within the thoracic aorta. Post median sternotomy, CABG and aortic valve replacement. Stable position of support apparatus. Minimally improved aeration  of the lungs with persistent cephalization of flow. Unchanged layering effusions and bibasilar opacities, left greater than right, atelectasis versus infiltrate. No new focal airspace opacities. Unchanged bones.  IMPRESSION: 1.  Stable positioning of support apparatus.  No pneumothorax. 2. Minimally improved aeration of lungs with persistent findings of pulmonary venous congestion, layering bilateral effusions and associated bibasilar opacities, atelectasis versus infiltrate.   Electronically Signed   By: Sandi Mariscal M.D.   On: 08/12/2014 07:39   Dg Chest Port 1 View  08/11/2014   CLINICAL DATA:  Hemodialysis catheter placement.  EXAM: PORTABLE CHEST - 1 VIEW  COMPARISON:  08/10/2014, 0449 hr  FINDINGS: Support apparatus: New LEFT IJ dialysis catheter is present with the distal tip in the mid SVC. RIGHT subclavian 2 lead cardiac pacemaker apparatus unchanged. LEFT upper extremity PICC is unchanged with the tip terminating in the upper SVC. Monitoring leads project over the chest.  Cardiomediastinal Silhouette:  Mildly enlarged, unchanged  Lungs: Perihilar and basilar predominant airspace disease and atelectasis compatible with moderate volume overload. No pneumothorax.  Effusions:  Bilateral effusions are Moderate.  Other:  None.  IMPRESSION: 1. Uncomplicated new LEFT IJ dialysis catheter with the tip in the mid SVC. 2. Moderate volume overload with bilateral pleural effusions and basilar predominant pulmonary edema.   Electronically Signed   By: Dereck Ligas M.D.   On: 08/11/2014 13:15   Medications: Infusions: . milrinone 0.25 mcg/kg/min (08/12/14 1553)  . dialysis replacement fluid (prismasate) 300 mL/hr at 08/13/14 0700  . dialysis replacement fluid (prismasate) 250 mL/hr at 08/11/14 1404  . dialysate (PRISMASATE) 1,000 mL/hr at 08/13/14 0700    Scheduled Medications: . amiodarone  200 mg Oral BID  . ampicillin (OMNIPEN) IV  2 g Intravenous 3 times per day  . antiseptic oral rinse  7 mL Mouth  Rinse BID  . aspirin EC  81 mg Oral Daily  . atorvastatin  10 mg Oral QPM  . cefTRIAXone (ROCEPHIN)  IV  2 g Intravenous Q12H  . darbepoetin (ARANESP) injection - NON-DIALYSIS  100 mcg Subcutaneous Q Wed-HD  . feeding supplement (ENSURE COMPLETE)  237 mL Oral BID BM  . folic acid  2 mg Oral Daily  . IMMUNE GLOBULIN 10% (HUMAN) IV - For Fluid Restriction Only  400 mg/kg Intravenous Q24H  . insulin aspart  0-15 Units Subcutaneous TID WC  . insulin aspart  0-5 Units Subcutaneous QHS  . pantoprazole  40 mg Oral Daily  . potassium chloride  40 mEq Oral BID  . pyridOXINE  100 mg Oral QPM  .  sodium chloride  10-40 mL Intracatheter Q12H  . vitamin B-12  1,000 mcg Oral QPM  . vitamin E  400 Units Oral Daily    have reviewed scheduled and prn medications.  Physical Exam: General: alert, NAD Heart: RRR Lungs: decreased BS at bases Abdomen: distended Extremities: pitting edema    08/13/2014,7:58 AM  LOS: 38 days

## 2014-08-13 NOTE — Progress Notes (Signed)
Pt does not need BIPAP at this time. Pt is on 2L [place] sat is 97%. RT will continue to monitor.

## 2014-08-13 NOTE — Consult Note (Signed)
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 male.  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 He was treated with amp and gent til his Cr increased and he was changed to amp/ceftriaxone. he underwent pacer extraction, AVR/MVR and Ao root replacement on 07/10/2014. He developed heart block post surgery and required pacer placement (07/28/2014). He developed acute respiratory distress and hypovolemia on 9-9.  His course has also been complicated by ITP.  He had HD line inserted 9-8 and was started on CRRT He had repeat Cx done on 9-8 and his BCx is now 1/2 yeast, UCx yeast as well. He was started on micafungin.   Past Medical History  Diagnosis Date  . CAD (coronary artery disease) prior stenting 1999   . Dyslipidemia   . HTN (hypertension)   . WPW (Wolff-Parkinson-White syndrome) loss of preexcitation   . Atrial fibrillation   . AV block, Mobitz II   . Presyncope   . Myocardial infarction   . Pacemaker 04/07/2013  . Arthritis   . History of chicken pox   . Anemia   . Hypernatremia 06/03/2013  . Thrombocytopenia, unspecified 06/03/2013  . Leukopenia 06/03/2013  . Obstructive sleep apnea 06/03/2013    USES CPAP  . Urinary incontinence 06/03/2013  . Hyperglycemia 12/21/2013  . Pancytopenia 06/03/2013  . Iron deficiency anemia,  unspecified 07/02/2014  . Malabsorption of iron 07/02/2014  . Hypotestosteronemia 07/02/2014  . ITP (idiopathic thrombocytopenic purpura) 07/02/2014    possible - although patient had bacterial endocarditis at the time of diagnosis  . Aortic insufficiency   . Chronic kidney disease   . Bacterial endocarditis      Enterococcus sepsis with aortic valve vegetation  . UTI (urinary tract infection) 07/22/2014    ENTEROBACTER CLOACAE   . S/P aortic valve and mitral valve replacement 08/03/2014    21 mm Medtronic Freestyle porcine aortic root graft 29 mm [facility] Mitral bovine bioprosthetic mitral valve   . S/P aortic valve replacement with stentless valve 08/02/2014    21 mm Medtronic Freestyle porcine aortic root graft with reimplantation of left main and right coronary arteries  . S/P mitral valve replacement with bioprosthetic valve 08/01/2014    29 mm [facility] Mitral bovine bioprosthetic tissue valve  . S/P CABG x 1 08/02/2014    SVG to OM1 with EVH via right thigh  . S/P Maze operation for atrial fibrillation 07/23/2014    Complete bilateral atrial lesion set using cryothermy and bipolar radiofrequency ablation with clipping of LA appendage  . Acute renal failure superimposed on stage 3 chronic kidney disease 07/27/2014  . Acute on chronic diastolic heart failure 0/27/7412    Past Surgical History  Procedure Laterality Date  . Coronary angioplasty with stent placement    . Vastectomy    . Insert / replace / remove pacemaker  04/07/2013  . Tee without cardioversion N/A 07/26/2014  Procedure: TRANSESOPHAGEAL ECHOCARDIOGRAM (TEE);  Surgeon: Lelon Perla, MD;  Location: [place];  Service: Cardiovascular;  Laterality: N/A;  . Aortic valve replacement N/A 07/23/2014    Procedure: ROOT REPLACEMENT WITH BIOPROSTHETIC PORCINE AORTIC ROOT REIMPLANTATION OF LEFT MAIN AND RIGHT CORONARY ARTERIES;  Surgeon: Rexene Alberts, MD;  Location: [place];  Service: Open Heart Surgery;  Laterality:  N/A;  . Intraoperative transesophageal echocardiogram N/A 07/04/2014    Procedure: INTRAOPERATIVE TRANSESOPHAGEAL ECHOCARDIOGRAM;  Surgeon: Rexene Alberts, MD;  Location: [place];  Service: Open Heart Surgery;  Laterality: N/A;  . Maze N/A 07/29/2014    Procedure: MAZE;  Surgeon: Rexene Alberts, MD;  Location: [place];  Service: Open Heart Surgery;  Laterality: N/A;  . Coronary artery bypass graft N/A 07/06/2014    Procedure: CORONARY ARTERY BYPASS GRAFTING (CABG), on pump, times one, using right greater saphenous vein harvested endoscopically.;  Surgeon: Rexene Alberts, MD;  Location: [place];  Service: Open Heart Surgery;  Laterality: N/A;  . Mitral valve replacement N/A 07/29/2014    Procedure: MITRAL VALVE (MV) REPLACEMENT;  Surgeon: Rexene Alberts, MD;  Location: [place];  Service: Open Heart Surgery;  Laterality: N/A;     No Known Allergies  Medications:  Scheduled: . ampicillin (OMNIPEN) IV  2 g Intravenous 3 times per day  . antiseptic oral rinse  7 mL Mouth Rinse BID  . aspirin EC  81 mg Oral Daily  . atorvastatin  10 mg Oral QPM  . cefTRIAXone (ROCEPHIN)  IV  2 g Intravenous Q12H  . darbepoetin (ARANESP) injection - NON-DIALYSIS  100 mcg Subcutaneous Q Wed-HD  . feeding supplement (ENSURE COMPLETE)  237 mL Oral BID BM  . folic acid  2 mg Oral Daily  . IMMUNE GLOBULIN 10% (HUMAN) IV - For Fluid Restriction Only  400 mg/kg Intravenous Q24H  . insulin aspart  0-15 Units Subcutaneous TID WC  . insulin aspart  0-5 Units Subcutaneous QHS  . ipratropium  0.5 mg Nebulization Q4H  . micafungin (MYCAMINE) IV  100 mg Intravenous Q24H  . pantoprazole  40 mg Oral Daily  . potassium chloride  40 mEq Oral BID  . pyridOXINE  100 mg Oral QPM  . sodium chloride  10-40 mL Intracatheter Q12H  . vitamin B-12  1,000 mcg Oral QPM  . vitamin E  400 Units Oral Daily    Abtx:  Anti-infectives   Start     Dose/Rate Route Frequency Ordered Stop   08/13/14 1500  micafungin (MYCAMINE) 100 mg in sodium  chloride 0.9 % 100 mL IVPB     100 mg 100 mL/hr over 1 Hours Intravenous Every 24 hours 08/13/14 1421     08/13/14 1430  micafungin (MYCAMINE) 100 mg in sodium chloride 0.9 % 100 mL IVPB  Status:  Discontinued     100 mg 100 mL/hr over 1 Hours Intravenous Daily 08/13/14 1421 08/13/14 1422   08/13/14 1000  fluconazole (DIFLUCAN) IVPB 100 mg  Status:  Discontinued     100 mg 50 mL/hr over 60 Minutes Intravenous Every 24 hours 08/13/14 0856 08/13/14 1421   07/17/2014 2200  ceFAZolin (ANCEF) IVPB 1 g/50 mL premix  Status:  Discontinued     1 g 100 mL/hr over 30 Minutes Intravenous 3 times per day 07/17/2014 1904 08/01/14 1252   07/04/2014 1915  gentamicin (GARAMYCIN) 80 mg in sodium chloride irrigation 0.9 % 500 mL irrigation  Status:  Discontinued     80 mg Irrigation On call 07/07/2014  1904 07/28/2014 1913   07/30/2014 1915  ceFAZolin (ANCEF) IVPB 2 g/50 mL premix  Status:  Discontinued     2 g 100 mL/hr over 30 Minutes Intravenous On call 07/30/2014 1904 07/20/2014 1913   07/13/2014 1315  gentamicin (GARAMYCIN) 80 mg in sodium chloride irrigation 0.9 % 500 mL irrigation  Status:  Discontinued      Irrigation To Cath Lab 07/23/2014 1313 08/01/14 0955   07/08/2014 1306  ceFAZolin (ANCEF) 2-3 GM-% IVPB SOLR    Comments:  Nadal, Cindy   : cabinet override      08/02/2014 1306 07/24/2014 1306   07/27/14 1500  ampicillin (OMNIPEN) 2 g in sodium chloride 0.9 % 50 mL IVPB     2 g 150 mL/hr over 20 Minutes Intravenous 3 times per day 07/27/14 1116     07/26/14 1200  cefTRIAXone (ROCEPHIN) 2 g in dextrose 5 % 50 mL IVPB     2 g 100 mL/hr over 30 Minutes Intravenous Every 12 hours 07/26/14 1131     07/25/14 0000  cefUROXime (ZINACEF) 1.5 g in dextrose 5 % 50 mL IVPB  Status:  Discontinued     1.5 g 100 mL/hr over 30 Minutes Intravenous Every 12 hours 07/31/2014 2004 07/26/14 1131   07/10/2014 2200  vancomycin (VANCOCIN) IVPB 1000 mg/200 mL premix     1,000 mg 200 mL/hr over 60 Minutes Intravenous  Once 07/28/2014 2004 07/26/2014  2346   07/07/2014 1745  vancomycin (VANCOCIN) 1,000 mg in sodium chloride 0.9 % 1,000 mL irrigation      Irrigation To Surgery 07/06/2014 1731 07/18/2014 1802   07/23/2014 0400  vancomycin (VANCOCIN) 1,250 mg in sodium chloride 0.9 % 250 mL IVPB     1,250 mg 166.7 mL/hr over 90 Minutes Intravenous To Surgery 07/23/14 1114 07/29/2014 0740   07/29/2014 0400  cefUROXime (ZINACEF) 1.5 g in dextrose 5 % 50 mL IVPB     1.5 g 100 mL/hr over 30 Minutes Intravenous To Surgery 07/23/14 1114 08/02/2014 1509   07/18/2014 0400  cefUROXime (ZINACEF) 750 mg in dextrose 5 % 50 mL IVPB  Status:  Discontinued     750 mg 100 mL/hr over 30 Minutes Intravenous To Surgery 07/23/14 1114 07/10/2014 1944   07/29/2014 0400  vancomycin (VANCOCIN) 1,000 mg in sodium chloride 0.9 % 1,000 mL irrigation      Irrigation To Surgery 07/23/14 1921 07/19/2014 1058   07/16/14 1300  [MAR Hold]  gentamicin (GARAMYCIN) IVPB 100 mg  Status:  Discontinued     (On MAR Hold since 07/11/2014 0627)   100 mg 200 mL/hr over 30 Minutes Intravenous Every 24 hours 07/15/14 1917 07/08/2014 1944   07/19/2014 1045  gentamicin (GARAMYCIN) 80 mg in sodium chloride irrigation 0.9 % 500 mL irrigation  Status:  Discontinued     80 mg Irrigation On call 07/07/2014 1031 07/26/2014 1805   07/12/14 1300  gentamicin (GARAMYCIN) IVPB 80 mg  Status:  Discontinued     80 mg 100 mL/hr over 30 Minutes Intravenous Every 24 hours 07/12/14 1200 07/15/14 1917   07/11/14 1200  ampicillin (OMNIPEN) 2 g in sodium chloride 0.9 % 50 mL IVPB  Status:  Discontinued     2 g 150 mL/hr over 20 Minutes Intravenous 4 times per day 07/11/14 0945 07/27/14 1116   07/07/14 0030  piperacillin-tazobactam (ZOSYN) IVPB 3.375 g  Status:  Discontinued     3.375 g 12.5 mL/hr over 240 Minutes Intravenous Every 8 hours 07/29/2014 2042 07/08/14 1602   07/21/2014  2300  levofloxacin (LEVAQUIN) IVPB 750 mg  Status:  Discontinued     750 mg 100 mL/hr over 90 Minutes Intravenous Every 48 hours 07/04/2014 2232 07/08/14 1022    07/15/2014 1615  piperacillin-tazobactam (ZOSYN) IVPB 3.375 g     3.375 g 100 mL/hr over 30 Minutes Intravenous  Once 07/09/2014 1604 07/29/2014 1755   07/20/2014 1615  vancomycin (VANCOCIN) IVPB 1000 mg/200 mL premix     1,000 mg 200 mL/hr over 60 Minutes Intravenous  Once 07/05/2014 1604 07/23/14 0709   07/29/2014 0030  vancomycin (VANCOCIN) IVPB 750 mg/150 ml premix  Status:  Discontinued     750 mg 150 mL/hr over 60 Minutes Intravenous Every 12 hours 07/26/2014 2042 07/11/14 0945      Total days of antibiotics  8-3 Amp 8-23 Ceftriaxone 8-9 Gent 8-21 9-9 micafungin          Social History:  reports that he quit smoking about 22 years ago. His smoking use included Cigarettes. He started smoking about 59 years ago. He has a 36 pack-year smoking history. He has never used smokeless tobacco. He reports that he drinks alcohol. He reports that he does not use illicit drugs.  Family History  Problem Relation Age of Onset  . Stroke Mother   . Hypertension Mother   . Stroke Sister   . Arthritis Sister     rheumatoid  . Heart attack Sister   . Anemia Sister   . Other Brother     tube put in aorta  . Anemia Brother   . Other Son     cortisone deficiency  . Arthritis Son   . Stroke Brother   . Alcohol abuse Brother   . Barrett's esophagus Son   . Colon cancer Maternal Grandmother   . Colon cancer Maternal Grandfather     General ROS: normal BM, normal urination (foley in) per pt, no fever, has had chills. see HPI.   Blood pressure 124/66, pulse 109, temperature 98.7 F (37.1 C), temperature source Oral, resp. rate 27, height $RemoveBe'5\' 11"'[id number]$  (1.803 m), weight 89.2 kg (196 lb 10.4 oz), SpO2 100.00%. General appearance: alert, cooperative and no distress Eyes: negative findings: conjunctivae and sclerae normal and pupils equal, round, reactive to light and accomodation Throat: normal findings: oropharynx pink & moist without lesions or evidence of thrush Neck: no adenopathy, supple, symmetrical,  trachea midline and L neck HD line non-tender Lungs: diminished breath sounds anterior - bilateral Chest wall: no tenderness, mid-line wound clean, no fluctuance or tenderness. R upper chest pacer is non-tender, no fluctuance. wound clean. L upper chest previous pacer site is clean, non-tender, no fluctuance. \ Heart: irregularly irregular rhythm Abdomen: normal findings: bowel sounds normal and soft, non-tender Extremities: edema none and L UE PIC site is dressed, no proximal cordis or tenderness. mild warmth.    Results for orders placed during the hospital encounter of 07/22/2014 (from the past 48 hour(s))  RENAL FUNCTION PANEL     Status: Abnormal   Collection Time    08/11/14  4:00 PM      Result Value Ref Range   Sodium 128 (*) 137 - 147 mEq/L   Potassium 3.6 (*) 3.7 - 5.3 mEq/L   Chloride 84 (*) 96 - 112 mEq/L   CO2 30  19 - 32 mEq/L   Glucose, Bld 274 (*) 70 - 99 mg/dL   BUN 66 (*) 6 - 23 mg/dL   Creatinine, Ser 1.73 (*) 0.50 - 1.35 mg/dL   Calcium 7.9 (*)  8.4 - 10.5 mg/dL   Phosphorus 4.8 (*) 2.3 - 4.6 mg/dL   Albumin 2.2 (*) 3.5 - 5.2 g/dL   GFR calc non Af Amer 36 (*) >90 mL/min   GFR calc Af Amer 41 (*) >90 mL/min   Comment: (NOTE)     The eGFR has been calculated using the CKD EPI equation.     This calculation has not been validated in all clinical situations.     eGFR's persistently <90 mL/min signify possible Chronic Kidney     Disease.   Anion gap 14  5 - 15  GLUCOSE, CAPILLARY     Status: Abnormal   Collection Time    08/11/14  4:05 PM      Result Value Ref Range   Glucose-Capillary 248 (*) 70 - 99 mg/dL  GLUCOSE, CAPILLARY     Status: None   Collection Time    08/11/14  9:43 PM      Result Value Ref Range   Glucose-Capillary 83  70 - 99 mg/dL  PROTIME-INR     Status: Abnormal   Collection Time    08/12/14  4:10 AM      Result Value Ref Range   Prothrombin Time 18.1 (*) 11.6 - 15.2 seconds   INR 1.50 (*) 0.00 - 1.49  RETICULOCYTES     Status: Abnormal    Collection Time    08/12/14  4:10 AM      Result Value Ref Range   Retic Ct Pct 6.6 (*) 0.4 - 3.1 %   RBC. 3.17 (*) 4.22 - 5.81 MIL/uL   Retic Count, Manual 209.2 (*) 19.0 - 186.0 K/uL  LACTATE DEHYDROGENASE     Status: Abnormal   Collection Time    08/12/14  4:10 AM      Result Value Ref Range   LDH 617 (*) 94 - 250 U/L  CBC     Status: Abnormal   Collection Time    08/12/14  4:10 AM      Result Value Ref Range   WBC 6.7  4.0 - 10.5 K/uL   RBC 3.17 (*) 4.22 - 5.81 MIL/uL   Hemoglobin 10.0 (*) 13.0 - 17.0 g/dL   HCT 31.0 (*) 39.0 - 52.0 %   MCV 97.8  78.0 - 100.0 fL   MCH 31.5  26.0 - 34.0 pg   MCHC 32.3  30.0 - 36.0 g/dL   RDW 23.8 (*) 11.5 - 15.5 %   Platelets 41 (*) 150 - 400 K/uL   Comment: CONSISTENT WITH PREVIOUS RESULT  RENAL FUNCTION PANEL     Status: Abnormal   Collection Time    08/12/14  4:10 AM      Result Value Ref Range   Sodium 133 (*) 137 - 147 mEq/L   Potassium 3.7  3.7 - 5.3 mEq/L   Chloride 91 (*) 96 - 112 mEq/L   Comment: DELTA CHECK NOTED   CO2 29  19 - 32 mEq/L   Glucose, Bld 95  70 - 99 mg/dL   BUN 54 (*) 6 - 23 mg/dL   Creatinine, Ser 2.20 (*) 0.50 - 1.35 mg/dL   Calcium 8.2 (*) 8.4 - 10.5 mg/dL   Phosphorus 3.7  2.3 - 4.6 mg/dL   Albumin 2.2 (*) 3.5 - 5.2 g/dL   GFR calc non Af Amer 27 (*) >90 mL/min   GFR calc Af Amer 31 (*) >90 mL/min   Comment: (NOTE)     The eGFR has been calculated using  the CKD EPI equation.     This calculation has not been validated in all clinical situations.     eGFR's persistently <90 mL/min signify possible Chronic Kidney     Disease.   Anion gap 13  5 - 15  MAGNESIUM     Status: None   Collection Time    08/12/14  4:10 AM      Result Value Ref Range   Magnesium 2.1  1.5 - 2.5 mg/dL  APTT     Status: Abnormal   Collection Time    08/12/14  4:10 AM      Result Value Ref Range   aPTT 45 (*) 24 - 37 seconds   Comment:            IF BASELINE aPTT IS ELEVATED,     SUGGEST PATIENT RISK ASSESSMENT     BE USED  TO DETERMINE APPROPRIATE     ANTICOAGULANT THERAPY.  GLUCOSE, CAPILLARY     Status: None   Collection Time    08/12/14  7:50 AM      Result Value Ref Range   Glucose-Capillary 98  70 - 99 mg/dL  GLUCOSE, CAPILLARY     Status: Abnormal   Collection Time    08/12/14 12:54 PM      Result Value Ref Range   Glucose-Capillary 140 (*) 70 - 99 mg/dL  PREPARE PLATELET PHERESIS     Status: None   Collection Time    08/12/14  2:58 PM      Result Value Ref Range   Unit Number [id number]     Blood Component Type PLTPHER LR1     Unit division 00     Status of Unit ISSUED,FINAL     Transfusion Status OK TO TRANSFUSE    RENAL FUNCTION PANEL     Status: Abnormal   Collection Time    08/12/14  4:00 PM      Result Value Ref Range   Sodium 135 (*) 137 - 147 mEq/L   Potassium 4.0  3.7 - 5.3 mEq/L   Chloride 94 (*) 96 - 112 mEq/L   CO2 28  19 - 32 mEq/L   Glucose, Bld 147 (*) 70 - 99 mg/dL   BUN 42 (*) 6 - 23 mg/dL   Creatinine, Ser 1.97 (*) 0.50 - 1.35 mg/dL   Calcium 8.2 (*) 8.4 - 10.5 mg/dL   Phosphorus 3.1  2.3 - 4.6 mg/dL   Albumin 2.2 (*) 3.5 - 5.2 g/dL   GFR calc non Af Amer 31 (*) >90 mL/min   GFR calc Af Amer 35 (*) >90 mL/min   Comment: (NOTE)     The eGFR has been calculated using the CKD EPI equation.     This calculation has not been validated in all clinical situations.     eGFR's persistently <90 mL/min signify possible Chronic Kidney     Disease.   Anion gap 13  5 - 15  LACTATE DEHYDROGENASE     Status: Abnormal   Collection Time    08/12/14  4:00 PM      Result Value Ref Range   LDH 596 (*) 94 - 250 U/L  PROTEIN, TOTAL     Status: None   Collection Time    08/12/14  4:00 PM      Result Value Ref Range   Total Protein 7.4  6.0 - 8.3 g/dL  CHOLESTEROL, TOTAL     Status: None   Collection Time  08/12/14  4:00 PM      Result Value Ref Range   Cholesterol 125  0 - 200 mg/dL  GLUCOSE, CAPILLARY     Status: Abnormal   Collection Time    08/12/14  4:32 PM       Result Value Ref Range   Glucose-Capillary 129 (*) 70 - 99 mg/dL  AFB CULTURE WITH SMEAR     Status: None   Collection Time    08/12/14  4:33 PM      Result Value Ref Range   Specimen Description FLUID RIGHT PLEURAL     Special Requests Normal     Acid Fast Smear       Value: NO ACID FAST BACILLI SEEN     Performed at [facility]   Culture       Value: CULTURE WILL BE EXAMINED FOR 6 WEEKS BEFORE ISSUING A FINAL REPORT     Performed at [facility]   Report Status PENDING    LACTATE DEHYDROGENASE, BODY FLUID     Status: Abnormal   Collection Time    08/12/14  4:42 PM      Result Value Ref Range   LD, Fluid 279 (*) 3 - 23 U/L   Fluid Type-FLDH FLUID     Comment: RIGHT     PLEURAL     CORRECTED ON 09/09 AT 1718: PREVIOUSLY REPORTED AS PLEURAL  PROTEIN, BODY FLUID     Status: None   Collection Time    08/12/14  4:42 PM      Result Value Ref Range   Total protein, fluid 2.1     Comment: NO NORMAL RANGE ESTABLISHED FOR THIS TEST   Fluid Type-FTP FLUID     Comment: RIGHT     PLEURAL     CORRECTED ON 09/09 AT 1718: PREVIOUSLY REPORTED AS PLEURAL  BODY FLUID CELL COUNT WITH DIFFERENTIAL     Status: Abnormal   Collection Time    08/12/14  4:42 PM      Result Value Ref Range   Fluid Type-FCT FLUID     Comment: RIGHT     PLEURAL     CORRECTED ON 09/09 AT 1722: PREVIOUSLY REPORTED AS PLEURAL   Color, Fluid RED (*) YELLOW   Appearance, Fluid TURBID (*) CLEAR   WBC, Fluid 496  0 - 1000 cu mm   Neutrophil Count, Fluid 77 (*) 0 - 25 %   Lymphs, Fluid 18     Monocyte-Macrophage-Serous Fluid 4 (*) 50 - 90 %   Eos, Fluid 1     Other Cells, Fluid 0    BODY FLUID CULTURE     Status: None   Collection Time    08/12/14  4:42 PM      Result Value Ref Range   Specimen Description FLUID RIGHT PLEURAL     Special Requests Normal     Gram Stain       Value: RARE WBC PRESENT,BOTH PMN AND MONONUCLEAR     NO ORGANISMS SEEN     Performed at [facility]   Culture        Value: NO GROWTH     Performed at [facility]   Report Status PENDING    PH, BODY FLUID     Status: None   Collection Time    08/12/14  4:42 PM      Result Value Ref Range   pH, Fluid Type       Value: CORRECTED  ON 09/09 AT 1719: PREVIOUSLY REPORTED AS PLEURAL   Comment: CORRECTED ON 09/10 AT 5885: PREVIOUSLY REPORTED AS FLUID RIGHT PLEURAL, CORRECTED ON 09/09 AT 1719: PREVIOUSLY REPORTED AS PLEURAL   pH, Fluid 8.50     Comment: Performed at [facility], CAPILLARY     Status: Abnormal   Collection Time    08/12/14  9:39 PM      Result Value Ref Range   Glucose-Capillary 243 (*) 70 - 99 mg/dL   Comment 1 Notify RN    PROTIME-INR     Status: Abnormal   Collection Time    08/13/14  4:10 AM      Result Value Ref Range   Prothrombin Time 17.4 (*) 11.6 - 15.2 seconds   INR 1.42  0.00 - 1.49  RETICULOCYTES     Status: Abnormal   Collection Time    08/13/14  4:10 AM      Result Value Ref Range   Retic Ct Pct 6.2 (*) 0.4 - 3.1 %   RBC. 3.00 (*) 4.22 - 5.81 MIL/uL   Retic Count, Manual 186.0  19.0 - 186.0 K/uL  LACTATE DEHYDROGENASE     Status: Abnormal   Collection Time    08/13/14  4:10 AM      Result Value Ref Range   LDH 593 (*) 94 - 250 U/L  CBC     Status: Abnormal   Collection Time    08/13/14  4:10 AM      Result Value Ref Range   WBC 6.3  4.0 - 10.5 K/uL   RBC 3.00 (*) 4.22 - 5.81 MIL/uL   Hemoglobin 9.4 (*) 13.0 - 17.0 g/dL   HCT 30.0 (*) 39.0 - 52.0 %   MCV 100.0  78.0 - 100.0 fL   MCH 31.3  26.0 - 34.0 pg   MCHC 31.3  30.0 - 36.0 g/dL   RDW 23.6 (*) 11.5 - 15.5 %   Platelets 64 (*) 150 - 400 K/uL   Comment: DELTA CHECK NOTED     SPECIMEN CHECKED FOR CLOTS     REPEATED TO VERIFY  RENAL FUNCTION PANEL     Status: Abnormal   Collection Time    08/13/14  4:10 AM      Result Value Ref Range   Sodium 134 (*) 137 - 147 mEq/L   Potassium 4.4  3.7 - 5.3 mEq/L   Chloride 96  96 - 112 mEq/L   CO2 27  19 - 32 mEq/L   Glucose, Bld 96   70 - 99 mg/dL   BUN 37 (*) 6 - 23 mg/dL   Creatinine, Ser 2.03 (*) 0.50 - 1.35 mg/dL   Calcium 8.0 (*) 8.4 - 10.5 mg/dL   Phosphorus 2.6  2.3 - 4.6 mg/dL   Albumin 2.2 (*) 3.5 - 5.2 g/dL   GFR calc non Af Amer 29 (*) >90 mL/min   GFR calc Af Amer 34 (*) >90 mL/min   Comment: (NOTE)     The eGFR has been calculated using the CKD EPI equation.     This calculation has not been validated in all clinical situations.     eGFR's persistently <90 mL/min signify possible Chronic Kidney     Disease.   Anion gap 11  5 - 15  MAGNESIUM     Status: None   Collection Time    08/13/14  4:10 AM      Result Value Ref Range   Magnesium 2.2  1.5 - 2.5 mg/dL  APTT     Status: Abnormal   Collection Time    08/13/14  4:10 AM      Result Value Ref Range   aPTT 46 (*) 24 - 37 seconds   Comment:            IF BASELINE aPTT IS ELEVATED,     SUGGEST PATIENT RISK ASSESSMENT     BE USED TO DETERMINE APPROPRIATE     ANTICOAGULANT THERAPY.  GLUCOSE, CAPILLARY     Status: None   Collection Time    08/13/14  7:40 AM      Result Value Ref Range   Glucose-Capillary 97  70 - 99 mg/dL  URINALYSIS, ROUTINE W REFLEX MICROSCOPIC     Status: Abnormal   Collection Time    08/13/14  9:49 AM      Result Value Ref Range   Color, Urine RED (*) YELLOW   Comment: BIOCHEMICALS MAY BE AFFECTED BY COLOR   APPearance TURBID (*) CLEAR   Specific Gravity, Urine 1.036 (*) 1.005 - 1.030   pH 5.0  5.0 - 8.0   Glucose, UA NEGATIVE  NEGATIVE mg/dL   Hgb urine dipstick LARGE (*) NEGATIVE   Bilirubin Urine SMALL (*) NEGATIVE   Ketones, ur 15 (*) NEGATIVE mg/dL   Protein, ur 100 (*) NEGATIVE mg/dL   Urobilinogen, UA 0.2  0.0 - 1.0 mg/dL   Nitrite NEGATIVE  NEGATIVE   Leukocytes, UA MODERATE (*) NEGATIVE  URINE MICROSCOPIC-ADD ON     Status: Abnormal   Collection Time    08/13/14  9:49 AM      Result Value Ref Range   Squamous Epithelial / LPF RARE  RARE   WBC, UA TOO NUMEROUS TO COUNT  <3 WBC/hpf   RBC / HPF TOO NUMEROUS  TO COUNT  <3 RBC/hpf   Bacteria, UA MANY (*) RARE   Urine-Other MANY YEAST    CARBOXYHEMOGLOBIN     Status: Abnormal   Collection Time    08/13/14 11:15 AM      Result Value Ref Range   Total hemoglobin 9.7 (*) 13.5 - 18.0 g/dL   O2 Saturation 58.0     Carboxyhemoglobin 2.2 (*) 0.5 - 1.5 %   Methemoglobin 0.3  0.0 - 1.5 %  GLUCOSE, CAPILLARY     Status: Abnormal   Collection Time    08/13/14 11:36 AM      Result Value Ref Range   Glucose-Capillary 147 (*) 70 - 99 mg/dL      Component Value Date/Time   SDES FLUID RIGHT PLEURAL 08/12/2014 1642   SPECREQUEST Normal 08/12/2014 1642   CULT  Value: NO GROWTH Performed at [facility] 08/12/2014 1642   REPTSTATUS PENDING 08/12/2014 1642   Dg Chest Portable 1 View  08/13/2014   CLINICAL DATA:  Thoracentesis.  EXAM: PORTABLE CHEST - 1 VIEW  COMPARISON:  Chest x-ray 08/12/2014.  FINDINGS: Left IJ dialysis catheter. Cardiomegaly. Prior cardiac valve replacement. Left atrial clips. Prior CABG. Pulmonary vascularity is normal. Bibasilar atelectasis and/or infiltrates. Interval reduction in left-sided pleural effusion. No pneumothorax.  IMPRESSION: 1. No evidence of pneumothorax post thoracentesis. 2. Interim improvement of left pleural effusion. Bibasilar atelectasis and infiltrates again noted. 3. Stable cardiomegaly. Prior cardiac valve replacement. Prior CABG. Left atrial appendage clip noted. Cardiac pacer in stable position. 4. Dialysis catheter noted in stable position.   Electronically Signed   By: Marcello Moores  Register   On: 08/13/2014 15:11   Dg Chest  Port 1 View  08/12/2014   CLINICAL DATA:  Post right thoracentesis  EXAM: PORTABLE CHEST - 1 VIEW  COMPARISON:  08/12/2014  FINDINGS: Interval decrease in right pleural effusion following thoracentesis. No pneumothorax  Moderate left effusion and left lower lobe infiltrate unchanged.  Improved aeration in the right lung base  Left jugular central venous catheter tips in the SVC also unchanged. Prior  heart surgery and pacemaker placement.  IMPRESSION: Negative for pneumothorax post right thoracentesis. Improved aeration right lung base  Left pleural effusion and left lower lobe infiltrate unchanged.   Electronically Signed   By: Franchot Gallo M.D.   On: 08/12/2014 16:50   Dg Chest Port 1 View  08/12/2014   CLINICAL DATA:  CHF, wheezing, shortness of breath  EXAM: PORTABLE CHEST - 1 VIEW  COMPARISON:  08/11/2014; 08/10/2014; 08/03/2014  FINDINGS: Grossly unchanged enlarged cardiac silhouette and mediastinal contours with atherosclerotic plaque within the thoracic aorta. Post median sternotomy, CABG and aortic valve replacement. Stable position of support apparatus. Minimally improved aeration of the lungs with persistent cephalization of flow. Unchanged layering effusions and bibasilar opacities, left greater than right, atelectasis versus infiltrate. No new focal airspace opacities. Unchanged bones.  IMPRESSION: 1.  Stable positioning of support apparatus.  No pneumothorax. 2. Minimally improved aeration of lungs with persistent findings of pulmonary venous congestion, layering bilateral effusions and associated bibasilar opacities, atelectasis versus infiltrate.   Electronically Signed   By: Sandi Mariscal M.D.   On: 08/12/2014 07:39   Recent Results (from the past 240 hour(s))  CULTURE, BLOOD (ROUTINE X 2)     Status: None   Collection Time    08/11/14  8:35 AM      Result Value Ref Range Status   Specimen Description BLOOD RIGHT HAND   Final   Special Requests BOTTLES DRAWN AEROBIC ONLY 4CC   Final   Culture  Setup Time     Final   Value: 08/11/2014 14:03     Performed at [facility]   Culture     Final   Value:        BLOOD CULTURE RECEIVED NO GROWTH TO DATE CULTURE WILL BE HELD FOR 5 DAYS BEFORE ISSUING A FINAL NEGATIVE REPORT     Performed at [facility]   Report Status PENDING   Incomplete  CULTURE, BLOOD (ROUTINE X 2)     Status: None   Collection Time    08/11/14   8:45 AM      Result Value Ref Range Status   Specimen Description BLOOD RIGHT HAND   Final   Special Requests BOTTLES DRAWN AEROBIC ONLY [facility]   Final   Culture  Setup Time     Final   Value: 08/11/2014 14:03     Performed at [facility]   Culture     Final   Value: YEAST     Note: Gram Stain Report Called to,Read Back By and Verified With: CRYSTAL RICE 08/13/14 1120 BY SMITHERSJ     Performed at [facility]   Report Status PENDING   Incomplete  URINE CULTURE     Status: None   Collection Time    08/11/14 11:44 AM      Result Value Ref Range Status   Specimen Description URINE, CATHETERIZED   Final   Special Requests Immunocompromised   Final   Culture  Setup Time     Final   Value: 08/11/2014 17:21     Performed at [facility]   Colony Count     Final   Value: 65,000 COLONIES/ML     Performed at [facility]   Culture     Final   Value: YEAST     Performed at [facility]   Report Status 08/12/2014 FINAL   Final  AFB CULTURE WITH SMEAR     Status: None   Collection Time    08/12/14  4:33 PM      Result Value Ref Range Status   Specimen Description FLUID RIGHT PLEURAL   Final   Special Requests Normal   Final   Acid Fast Smear     Final   Value: NO ACID FAST BACILLI SEEN     Performed at [facility]   Culture     Final   Value: CULTURE WILL BE EXAMINED FOR 6 WEEKS BEFORE ISSUING A FINAL REPORT     Performed at [facility]   Report Status PENDING   Incomplete  BODY FLUID CULTURE     Status: None   Collection Time    08/12/14  4:42 PM      Result Value Ref Range Status   Specimen Description FLUID RIGHT PLEURAL   Final   Special Requests Normal   Final   Gram Stain     Final   Value: RARE WBC PRESENT,BOTH PMN AND MONONUCLEAR     NO ORGANISMS SEEN     Performed at [facility]   Culture     Final   Value: NO GROWTH     Performed at [facility]   Report Status PENDING   Incomplete       08/13/2014, 3:32 PM     LOS: 38 days

## 2014-08-13 NOTE — Progress Notes (Addendum)
Subjective:   Volume status improving. Weight down 24 pounds? But on different scale. Remains on CVVHD. Yesterday had thoracentesis.-- Yielded 1.3 liters .   Platelets 112k-> 37k-> 29k-> 41k>64 . On IVIG and NPlate.  No active bleeding   Still SOB at rest.   Objective:   Scheduled Meds: . amiodarone  200 mg Oral BID  . ampicillin (OMNIPEN) IV  2 g Intravenous 3 times per day  . antiseptic oral rinse  7 mL Mouth Rinse BID  . aspirin EC  81 mg Oral Daily  . atorvastatin  10 mg Oral QPM  . cefTRIAXone (ROCEPHIN)  IV  2 g Intravenous Q12H  . darbepoetin (ARANESP) injection - NON-DIALYSIS  100 mcg Subcutaneous Q Wed-HD  . feeding supplement (ENSURE COMPLETE)  237 mL Oral BID BM  . fluconazole (DIFLUCAN) IV  100 mg Intravenous Q24H  . folic acid  2 mg Oral Daily  . IMMUNE GLOBULIN 10% (HUMAN) IV - For Fluid Restriction Only  400 mg/kg Intravenous Q24H  . insulin aspart  0-15 Units Subcutaneous TID WC  . insulin aspart  0-5 Units Subcutaneous QHS  . pantoprazole  40 mg Oral Daily  . potassium chloride  40 mEq Oral BID  . pyridOXINE  100 mg Oral QPM  . sodium chloride  10-40 mL Intracatheter Q12H  . vitamin B-12  1,000 mcg Oral QPM  . vitamin E  400 Units Oral Daily   Continuous Infusions: . milrinone 0.25 mcg/kg/min (08/13/14 0800)  . dialysis replacement fluid (prismasate) 300 mL/hr at 08/13/14 0700  . dialysis replacement fluid (prismasate) 250 mL/hr at 08/11/14 1404  . dialysate (PRISMASATE) 1,000 mL/hr at 08/13/14 0700   PRN Meds:.acetaminophen, heparin, levalbuterol, ondansetron (ZOFRAN) IV, ondansetron (ZOFRAN) IV, ondansetron, promethazine, sodium chloride, sodium chloride, traMADol, zolpidem  Filed Vitals:   08/13/14 0700 08/13/14 0730 08/13/14 0800 08/13/14 0900  BP: 121/63  99/68 123/83  Pulse: 116  109   Temp:  97.8 F (36.6 C)    TempSrc:  Oral    Resp: 22  21   Height:      Weight:      SpO2: 95%  98%     Intake/Output from previous  day:  Intake/Output Summary (Last 24 hours) at 08/13/14 0925 Last data filed at 08/13/14 0900  Gross per 24 hour  Intake 2457.4 ml  Output   8023 ml  Net -5565.6 ml    Physical Exam: CVP 13  Physical exam:  Dyspneic talking.  Skin is warm and dry.  HEENT is normal.  Neck is supple. JVP ~10  LIJ trialysis Chest bilateral rhonchi . Pacemaker site with no hematoma Cardiovascular exam is regular rate and rhythm. 2/6 systolic murmur Abdominal exam nontender or distended. No masses palpated. Extremities show . No edema. LEs wrapped neuro grossly intact    Lab Results: Basic Metabolic Panel:  Recent Labs  08/12/14 0410 08/12/14 1600 08/13/14 0410  NA 133* 135* 134*  K 3.7 4.0 4.4  CL 91* 94* 96  CO2 29 28 27   GLUCOSE 95 147* 96  BUN 54* 42* 37*  CREATININE 2.20* 1.97* 2.03*  CALCIUM 8.2* 8.2* 8.0*  MG 2.1  --  2.2  PHOS 3.7 3.1 2.6   CBC:  Recent Labs  08/12/14 0410 08/13/14 0410  WBC 6.7 6.3  HGB 10.0* 9.4*  HCT 31.0* 30.0*  MCV 97.8 100.0  PLT 41* 64*     Assessment/Plan:  1. Status post aortic root replacement, bioprosthetic aortic valve replacement, bioprosthetic  mitral valve replacement, coronary artery bypass graft (to LCx system) and maze-Continue rehab. 2. Acute on chronic kidney failure-nephrology following. Significantly volume overloaded, getting CVVH. 3. Thrombocytopenia-improved. Suspect ITP.  Hematology following. 4. Paroxysmal atrial fibrillation-patient is in AV paced rhythm today. Continue amiodarone.  Coumadin held for thrombocytopenia. 5. CAD-continue aspirin and statin. Had SVG-LCx system with recent cardiac surgery.  6. Status post pacemaker for CHB 7. Enterococcal endocarditis-continue antibiotics; will need fu with ID 8. Acute diastolic CHF with prominent RV failure.  He is on milrinone gtt for RV support.  EF 50-55% on last echo.  9. Acute respiratory failure 10. UTI- yeast. To start diflucan 11. Pleural effusions: s/p thoracentesis  on right.   He remains on CVVHD.  Will continue milrinone as tolerated. Plan for thoracentesis  CLEGG,AMY, NP-C  9:25 AM  Patient seen with NP, agree with the above note.  He had a lot of fluid removed but CVP and JVP still high.  He is short of breath this morning.  This morning, he is in and out of atrial fibrillation.   - Plan for left thoracentesis today.  - Will transition amiodarone over to IV to try to reload and see if we can keep him in NSR.   - Continue CVVH for fluid removal.  - Will add nebs given wheezing on exam.  - Check co-ox.   Loralie Champagne 08/13/2014 10:51 AM

## 2014-08-13 NOTE — Consult Note (Signed)
PULMONARY / CRITICAL CARE MEDICINE   Name: Justin [name] MRN: [id number] DOB: [birth year]    ADMISSION DATE:  07/20/2014 CONSULTATION DATE:  08/11/14  REFERRING MD :  Dr. Haroldine Laws   CHIEF COMPLAINT:  Volume overload, respiratory failure.   INITIAL PRESENTATION: 80 y/o M initially admitted 8/3 with dyspnea, fever.  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  Had CABG with AVR/MVR.  Developed acute respiratory failure and renal failure with hypervolemia.  PCCM consulted to assist with management of respiratory failure.  STUDIES:  8/03  CTA Chest >> no PE, bilateral effusions, extensive GGO, nodular opacities (largest 33mm) 8/04  ECHO >> EF 50-55%, Grade 2 DD, mod AI, mod-severe MR/TR, severe pulmonary HTN PA peak 65 8/06  CT ABD/Pelvis >> no source for bacteremia, anasarca, mod-large pleural effusions 8/06  CT Head >> negative for acute abnormality 8/11  TTE >> nml LV fxn, severe MR, large aortic valve vegetation w mod AI, no pacemaker vegetation identified 8/12  HIT >> neg  9/02  ECHO >> EF 50-55%, no wma, bioprosthesis of AV/MV, mod TR, PA peak 44  SIGNIFICANT EVENTS: 8/03  Admit for sepsis 8/11  Removal of Medtronic dual-chamber pacemaker secondary to enterococcal endoaortitis  8/14  LHC >> heavily calcified ostial circumflex with 70% stenosis, otherwise no critical stenosis 8/21  AVR, Aortic Root replacement, MVR, CABG x1, MAZE procedure per Dr. Roxy Manns 8/22  Extubation 8/28  Implantation of Medtronic dual-chamber pacemaker for symptomatic bradycardia / CHB 9/08  Worsening respiratory status, desaturations, tx to ICU, CVVHD started 9/9 improved after thoracentesis and neg balance on cvvhd  Subjective:  Still with sob any moveemnt  VITAL SIGNS: Temp:  [97.8 F (36.6 C)-99.1 F (37.3 C)] 97.8 F (36.6 C) (09/10 0730) Pulse Rate:  [85-122] 109 (09/10 0800) Resp:  [18-29] 21 (09/10 0800) BP: (90-144)/(49-99) 99/68 mmHg (09/10 0800) SpO2:  [93 %-100 %] 98 % (09/10  0800) Weight:  [89.2 kg (196 lb 10.4 oz)] 89.2 kg (196 lb 10.4 oz) (09/10 0500)  HEMODYNAMICS: CVP:  [11 mmHg-24 mmHg] 13 mmHg  VENTILATOR SETTINGS:   INTAKE / OUTPUT:  Intake/Output Summary (Last 24 hours) at 08/13/14 0847 Last data filed at 08/13/14 0800  Gross per 24 hour  Intake 2245.7 ml  Output   7898 ml  Net -5652.3 ml    PHYSICAL EXAMINATION: General: pleasant, increased WOB remains with movement of any kind Neuro:  Alert, normal strength, CN intact HEENT:  jvd noted, but reduced Cardiovascular:  s1 s2 Irregular, 2/6 SM Lungs: right base improved, left reduced Abdomen:  Soft, non tender, + bowel sounds Musculoskeletal:  3+ edema, reduced Skin:  No rashes  LABS:  CBC  Recent Labs Lab 08/11/14 0845 08/12/14 0410 08/13/14 0410  WBC 7.4 6.7 6.3  HGB 10.4* 10.0* 9.4*  HCT 31.4* 31.0* 30.0*  PLT 29* 41* 64*   Coag's  Recent Labs Lab 08/11/14 0515 08/12/14 0410 08/13/14 0410  APTT  --  45* 46*  INR 1.75* 1.50* 1.42   BMET  Recent Labs Lab 08/12/14 0410 08/12/14 1600 08/13/14 0410  NA 133* 135* 134*  K 3.7 4.0 4.4  CL 91* 94* 96  CO2 29 28 27   BUN 54* 42* 37*  CREATININE 2.20* 1.97* 2.03*  GLUCOSE 95 147* 96   Electrolytes  Recent Labs Lab 08/12/14 0410 08/12/14 1600 08/13/14 0410  CALCIUM 8.2* 8.2* 8.0*  MG 2.1  --  2.2  PHOS 3.7 3.1 2.6    ABG No results found for this  basename: PHART, PCO2ART, PO2ART,  in the last 168 hours  Liver Enzymes  Recent Labs Lab 08/10/14 0507  08/12/14 0410 08/12/14 1600 08/13/14 0410  AST 54*  --   --   --   --   ALT 25  --   --   --   --   ALKPHOS 136*  --   --   --   --   BILITOT 0.3  --   --   --   --   ALBUMIN 2.0*  < > 2.2* 2.2* 2.2*  < > = values in this interval not displayed. Cardiac Enzymes No results found for this basename: TROPONINI, PROBNP,  in the last 168 hours Glucose  Recent Labs Lab 08/11/14 2143 08/12/14 0750 08/12/14 1254 08/12/14 1632 08/12/14 2139  08/13/14 0740  GLUCAP 83 98 140* 129* 243* 97    Imaging Dg Chest Port 1 View  08/12/2014   CLINICAL DATA:  Post right thoracentesis  EXAM: PORTABLE CHEST - 1 VIEW  COMPARISON:  08/12/2014  FINDINGS: Interval decrease in right pleural effusion following thoracentesis. No pneumothorax  Moderate left effusion and left lower lobe infiltrate unchanged.  Improved aeration in the right lung base  Left jugular central venous catheter tips in the SVC also unchanged. Prior heart surgery and pacemaker placement.  IMPRESSION: Negative for pneumothorax post right thoracentesis. Improved aeration right lung base  Left pleural effusion and left lower lobe infiltrate unchanged.   Electronically Signed   By: Franchot Gallo M.D.   On: 08/12/2014 16:50   Dg Chest Port 1 View  08/12/2014   CLINICAL DATA:  CHF, wheezing, shortness of breath  EXAM: PORTABLE CHEST - 1 VIEW  COMPARISON:  08/11/2014; 08/10/2014; 08/03/2014  FINDINGS: Grossly unchanged enlarged cardiac silhouette and mediastinal contours with atherosclerotic plaque within the thoracic aorta. Post median sternotomy, CABG and aortic valve replacement. Stable position of support apparatus. Minimally improved aeration of the lungs with persistent cephalization of flow. Unchanged layering effusions and bibasilar opacities, left greater than right, atelectasis versus infiltrate. No new focal airspace opacities. Unchanged bones.  IMPRESSION: 1.  Stable positioning of support apparatus.  No pneumothorax. 2. Minimally improved aeration of lungs with persistent findings of pulmonary venous congestion, layering bilateral effusions and associated bibasilar opacities, atelectasis versus infiltrate.   Electronically Signed   By: Sandi Mariscal M.D.   On: 08/12/2014 07:39     ASSESSMENT / PLAN:  PULMONARY A: Acute Hypoxic Respiratory Failure - in setting of volume overload / CHF / b/l pleural effusions. Right greater left effusions, s/p thora rt 1.350 liters 9/9 Hx of OSA  on CPAP as outpt. P:   BIPAP is available prn, not required Still SOB, unlikely left effusion will absorb with cvvhd , given how large on [place] Will consider thora left therapeutic given clinical status Trend CXR in am Neg balance goals remain on cvvhd, had major neg balance over 5 liters not including the thora CPAP nocturnal   CARDIOVASCULAR PICC 8/28 >>  A:  CAD s/p CABG x1 with MVR/AVR - 8/21 per Dr. Roxy Manns.  PAF - controlled, not on anticoagulation. CHB s/p Pacemaker - 8/28 per Dr. Lovena Le. P:  Continue Amiodarone, ASA, Lipitor Milrinone gtts per CHF SVC May need further fib rate control  RENAL A:   Acute Kidney Injury 2nd to cardiorenal syndrome- improved but remains volume overloaded. Hypokalemia. Hyponatremia in setting of hypervolemia.  P:   CVVHD to neg balance goals remain Replace electrolytes as indicated  GASTROINTESTINAL A:  Protein Calorie Malnutrition - in setting of critical illness. P:   diet and advancement PPI for SUP  HEMATOLOGIC A:   Anemia of critical illness. Thrombocytopenia - concern for ITP, followed by Dr. Marin Olp.  HIT negative. P:  Monitor platelets in am further IVIG, Prednisone remain, plat count maintaining Cbc in am  Will not need tx if thora repeat today as over 50 k Appreciate Dr Roxy Manns note, agree consider hep through cvvhd and no coumadin until further procedure needs defined  INFECTIOUS A:   Enterococcal Endocarditis. P:   BCx2 9/8 >> UC 9/8 >>yeast 65 k  Ampicillin, start date 8/08>>> Rocephin, start date 8/23>>> Diflucan 9/10>>> Yeast noted 8/24 UA and yeast growth , consider addition diflucan and repeat UA, change foley  ENDOCRINE A:   Hyperglycemia - in setting of critical illness + steroid administration  P:   SSI   NEUROLOGIC A:   TIA 8/06 >> resolved. P: Avoid benzo  TODAY'S SUMMARY: Still wob, will consider thora left, CVVHD to neg still, change foley, add diflucan  Ccm time 30 min   Lavon Paganini. Titus Mould,  MD, [place] Pgr: [facility]

## 2014-08-13 NOTE — Progress Notes (Addendum)
Foley discontinued per MD order and protocol this AM.  UA sent.  External catheter placed x3 attempts--Unsuccessful.   Perineum cleansed prior to external catheter placement.  Perineum excoriated.  New foley placed per MD order and protocol for strict I&O and increased work of breathing. Foley huddle completed and 2 RNs at bedside.  Peri care per protocol completed.  Patient educated and verbalized understanding.  Cardiology gave order to re-incert foley. Darrick Grinder NP)

## 2014-08-14 ENCOUNTER — Inpatient Hospital Stay (HOSPITAL_COMMUNITY): Payer: Medicare Other

## 2014-08-14 DIAGNOSIS — B49 Unspecified mycosis: Secondary | ICD-10-CM | POA: Diagnosis not present

## 2014-08-14 DIAGNOSIS — I369 Nonrheumatic tricuspid valve disorder, unspecified: Secondary | ICD-10-CM

## 2014-08-14 LAB — BASIC METABOLIC PANEL
Anion gap: 12 (ref 5–15)
BUN: 37 mg/dL — ABNORMAL HIGH (ref 6–23)
CALCIUM: 8 mg/dL — AB (ref 8.4–10.5)
CHLORIDE: 94 meq/L — AB (ref 96–112)
CO2: 24 mEq/L (ref 19–32)
Creatinine, Ser: 2.71 mg/dL — ABNORMAL HIGH (ref 0.50–1.35)
GFR calc Af Amer: 24 mL/min — ABNORMAL LOW (ref >90)
GFR calc non Af Amer: 21 mL/min — ABNORMAL LOW (ref >90)
GLUCOSE: 105 mg/dL — AB (ref 70–99)
Potassium: 5 mEq/L (ref 3.7–5.3)
Sodium: 130 mEq/L — ABNORMAL LOW (ref 137–147)

## 2014-08-14 LAB — CBC WITH DIFFERENTIAL/PLATELET
Basophils Absolute: 0.1 10*3/uL (ref 0.0–0.1)
Basophils Relative: 2 % — ABNORMAL HIGH (ref 0–1)
Eosinophils Absolute: 0 10*3/uL (ref 0.0–0.7)
Eosinophils Relative: 1 % (ref 0–5)
HEMATOCRIT: 30.3 % — AB (ref 39.0–52.0)
HEMOGLOBIN: 9.4 g/dL — AB (ref 13.0–17.0)
LYMPHS ABS: 0.7 10*3/uL (ref 0.7–4.0)
LYMPHS PCT: 12 % (ref 12–46)
MCH: 31.5 pg (ref 26.0–34.0)
MCHC: 31 g/dL (ref 30.0–36.0)
MCV: 101.7 fL — AB (ref 78.0–100.0)
MONO ABS: 0.5 10*3/uL (ref 0.1–1.0)
Monocytes Relative: 9 % (ref 3–12)
NEUTROS ABS: 4.5 10*3/uL (ref 1.7–7.7)
Neutrophils Relative %: 77 % (ref 43–77)
Platelets: 65 10*3/uL — ABNORMAL LOW (ref 150–400)
RBC: 2.98 MIL/uL — AB (ref 4.22–5.81)
RDW: 23.4 % — AB (ref 11.5–15.5)
WBC: 5.9 10*3/uL (ref 4.0–10.5)

## 2014-08-14 LAB — RETICULOCYTES
RBC.: 2.98 MIL/uL — ABNORMAL LOW (ref 4.22–5.81)
RETIC CT PCT: 6.1 % — AB (ref 0.4–3.1)
Retic Count, Absolute: 181.8 10*3/uL (ref 19.0–186.0)

## 2014-08-14 LAB — GLUCOSE, CAPILLARY
GLUCOSE-CAPILLARY: 106 mg/dL — AB (ref 70–99)
GLUCOSE-CAPILLARY: 119 mg/dL — AB (ref 70–99)
GLUCOSE-CAPILLARY: 194 mg/dL — AB (ref 70–99)
Glucose-Capillary: 134 mg/dL — ABNORMAL HIGH (ref 70–99)

## 2014-08-14 LAB — PROTIME-INR
INR: 1.35 (ref 0.00–1.49)
Prothrombin Time: 16.7 seconds — ABNORMAL HIGH (ref 11.6–15.2)

## 2014-08-14 LAB — LACTATE DEHYDROGENASE: LDH: 636 U/L — ABNORMAL HIGH (ref 94–250)

## 2014-08-14 MED ORDER — HEPARIN SODIUM (PORCINE) 1000 UNIT/ML DIALYSIS
1000.0000 [IU] | INTRAMUSCULAR | Status: DC | PRN
  Filled 2014-08-14: qty 6

## 2014-08-14 MED ORDER — SODIUM CHLORIDE 0.9 % FOR CRRT
INTRAVENOUS_CENTRAL | Status: DC | PRN
  Filled 2014-08-14: qty 1000

## 2014-08-14 MED ORDER — PERFLUTREN LIPID MICROSPHERE
INTRAVENOUS | Status: AC
  Administered 2014-08-14: 11:00:00
  Filled 2014-08-14: qty 10

## 2014-08-14 MED ORDER — PERFLUTREN LIPID MICROSPHERE
1.0000 mL | INTRAVENOUS | Status: AC | PRN
  Filled 2014-08-14: qty 10

## 2014-08-14 MED ORDER — PRISMASOL BGK 4/2.5 32-4-2.5 MEQ/L IV SOLN
INTRAVENOUS | Status: DC
  Administered 2014-08-14 – 2014-08-16 (×5): via INTRAVENOUS_CENTRAL
  Filled 2014-08-14 (×6): qty 5000

## 2014-08-14 MED ORDER — INFLUENZA VAC SPLIT QUAD 0.5 ML IM SUSY
0.5000 mL | PREFILLED_SYRINGE | INTRAMUSCULAR | Status: AC
  Administered 2014-08-15: 0.5 mL via INTRAMUSCULAR
  Filled 2014-08-14: qty 0.5

## 2014-08-14 MED ORDER — PRISMASOL BGK 4/2.5 32-4-2.5 MEQ/L IV SOLN
INTRAVENOUS | Status: DC
  Administered 2014-08-14 – 2014-08-16 (×4): via INTRAVENOUS_CENTRAL
  Filled 2014-08-14 (×6): qty 5000

## 2014-08-14 MED ORDER — PRISMASOL BGK 4/2.5 32-4-2.5 MEQ/L IV SOLN
INTRAVENOUS | Status: DC
  Administered 2014-08-14 – 2014-08-16 (×10): via INTRAVENOUS_CENTRAL
  Filled 2014-08-14 (×27): qty 5000

## 2014-08-14 MED ORDER — LIDOCAINE HCL (PF) 1 % IJ SOLN
INTRAMUSCULAR | Status: AC
  Administered 2014-08-14: 5 mL
  Filled 2014-08-14: qty 5

## 2014-08-14 MED ORDER — DEXTROSE 5 % IV SOLN
160.0000 mg | Freq: Four times a day (QID) | INTRAVENOUS | Status: DC
  Administered 2014-08-14 – 2014-08-15 (×4): 160 mg via INTRAVENOUS
  Filled 2014-08-14 (×5): qty 16

## 2014-08-14 NOTE — Progress Notes (Signed)
Subjective:  Events noted- had foley issue- cultures came back with fungemia- confusion- all lines needed to be removed so CRRT stopped temporarily  Objective Vital signs in last 24 hours: Filed Vitals:   08/14/14 0600 08/14/14 0700 08/14/14 0723 08/14/14 0800  BP: 111/71 124/68 124/68 123/74  Pulse:      Temp:   98.4 F (36.9 C)   TempSrc:   Oral   Resp: 24 21 31 25   Height:      Weight:      SpO2: 97% 98% 95% 100%   Weight change: 0.2 kg (7.1 oz)  Intake/Output Summary (Last 24 hours) at 08/14/14 0911 Last data filed at 08/14/14 0900  Gross per 24 hour  Intake 2034.4 ml  Output   2405 ml  Net -370.6 ml    Assessment/ Plan: Pt is a 80 y.o. yo male who was admitted on 07/29/2014 with A on CRF  s/p complicated cardiac surgey with complications  Assessment/Plan: 1. Renal- UOP variable- so CRRT was started on 9/8. Now stopped temporarily for fungemia- giving line holiday- plan to resume later today 2. HTN/vol- had continued diuretics as was making urine but now they are stopped- will give high dose for the day 3. Anemia- gave aranesp- s/p transfusion- stable 4. Hypokalemia- now marginal and hyperk- when resume CRRT still will do 4 K bath 5. Hyponatremia- due to volume overload- improved but now worse 6. Cardiac- on milrinone/dopamine- hemodynamics are improved- per cards 7. Thrombocytopenia- improved on IVIG  Justin [name] A       Labs: Basic Metabolic Panel:  Recent Labs Lab 08/12/14 1600 08/13/14 0410 08/13/14 1600 08/14/14 0645  NA 135* 134* 135* 130*  K 4.0 4.4 5.0 5.0  CL 94* 96 97 94*  CO2 28 27 25 24   GLUCOSE 147* 96 118* 105*  BUN 42* 37* 32* 37*  CREATININE 1.97* 2.03* 2.26* 2.71*  CALCIUM 8.2* 8.0* 7.9* 8.0*  PHOS 3.1 2.6 2.3  --    Liver Function Tests:  Recent Labs Lab 08/10/14 0507  08/12/14 1600 08/13/14 0410 08/13/14 1600  AST 54*  --   --   --   --   ALT 25  --   --   --   --   ALKPHOS 136*  --   --   --   --   BILITOT 0.3  --    --   --   --   PROT 5.8*  --  7.4  --   --   ALBUMIN 2.0*  < > 2.2* 2.2* 2.1*  < > = values in this interval not displayed. No results found for this basename: LIPASE, AMYLASE,  in the last 168 hours No results found for this basename: AMMONIA,  in the last 168 hours CBC:  Recent Labs Lab 08/10/14 1025 08/11/14 0845 08/12/14 0410 08/13/14 0410 08/14/14 0645  WBC 8.2 7.4 6.7 6.3 5.9  NEUTROABS  --   --   --   --  4.5  HGB 8.8* 10.4* 10.0* 9.4* 9.4*  HCT 27.3* 31.4* 31.0* 30.0* 30.3*  MCV 101.5* 97.2 97.8 100.0 101.7*  PLT 37* 29* 41* 64* 65*   Cardiac Enzymes: No results found for this basename: CKTOTAL, CKMB, CKMBINDEX, TROPONINI,  in the last 168 hours CBG:  Recent Labs Lab 08/13/14 0740 08/13/14 1136 08/13/14 1634 08/13/14 2122 08/14/14 0727  GLUCAP 97 147* 134* 134* 106*    Iron Studies:  No results found for this basename: IRON, TIBC, TRANSFERRIN, FERRITIN,  in  the last 72 hours Studies/Results: Dg Chest Port 1 View  08/14/2014   CLINICAL DATA:  Pleural effusions PORTABLE CHEST - 1 VIEW  COMPARISON:  08/13/2014.  08/12/2014.  FINDINGS: Interim removal of dialysis catheter. Cardiomegaly. Cardiac pacer lead tips in the right atrium and right ventricle. Prior CABG. Prior cardiac valve replacement. Atrial appendage clip. Bilateral pulmonary alveolar infiltrates are present. These are more prominent than on prior exam and are consistent with congestive heart failure and pulmonary edema. Bilateral pleural effusions are noted. No pneumothorax. No acute osseus abnormality.  IMPRESSION: 1. Interim removal of dialysis catheter. 2. Prior CABG. Prior cardiac valve replacement. Left atrial appendage clip. Cardiac pacer. 3. Congestive heart failure with bilateral pulmonary edema and small bilateral pleural effusions. These findings have progressed from prior exam.   Electronically Signed   By: [place]   On: 08/14/2014 07:32   Dg Chest Portable 1 View  08/13/2014   CLINICAL  DATA:  Thoracentesis.  EXAM: PORTABLE CHEST - 1 VIEW  COMPARISON:  Chest x-ray 08/12/2014.  FINDINGS: Left IJ dialysis catheter. Cardiomegaly. Prior cardiac valve replacement. Left atrial clips. Prior CABG. Pulmonary vascularity is normal. Bibasilar atelectasis and/or infiltrates. Interval reduction in left-sided pleural effusion. No pneumothorax.  IMPRESSION: 1. No evidence of pneumothorax post thoracentesis. 2. Interim improvement of left pleural effusion. Bibasilar atelectasis and infiltrates again noted. 3. Stable cardiomegaly. Prior cardiac valve replacement. Prior CABG. Left atrial appendage clip noted. Cardiac pacer in stable position. 4. Dialysis catheter noted in stable position.   Electronically Signed   By: Marcello Moores  Register   On: 08/13/2014 15:11   Dg Chest Port 1 View  08/12/2014   CLINICAL DATA:  Post right thoracentesis  EXAM: PORTABLE CHEST - 1 VIEW  COMPARISON:  08/12/2014  FINDINGS: Interval decrease in right pleural effusion following thoracentesis. No pneumothorax  Moderate left effusion and left lower lobe infiltrate unchanged.  Improved aeration in the right lung base  Left jugular central venous catheter tips in the SVC also unchanged. Prior heart surgery and pacemaker placement.  IMPRESSION: Negative for pneumothorax post right thoracentesis. Improved aeration right lung base  Left pleural effusion and left lower lobe infiltrate unchanged.   Electronically Signed   By: Franchot Gallo M.D.   On: 08/12/2014 16:50   Medications: Infusions: . amiodarone 30 mg/hr (08/14/14 0818)  . milrinone 0.25 mcg/kg/min (08/14/14 0700)    Scheduled Medications: . ampicillin (OMNIPEN) IV  2 g Intravenous 3 times per day  . antiseptic oral rinse  7 mL Mouth Rinse BID  . aspirin EC  81 mg Oral Daily  . atorvastatin  10 mg Oral QPM  . cefTRIAXone (ROCEPHIN)  IV  2 g Intravenous Q12H  . darbepoetin (ARANESP) injection - NON-DIALYSIS  100 mcg Subcutaneous Q Wed-HD  . feeding supplement (ENSURE  COMPLETE)  237 mL Oral BID BM  . folic acid  2 mg Oral Daily  . IMMUNE GLOBULIN 10% (HUMAN) IV - For Fluid Restriction Only  400 mg/kg Intravenous Q24H  . [START ON 08/15/2014] Influenza vac split quadrivalent PF  0.5 mL Intramuscular Tomorrow-1000  . insulin aspart  0-15 Units Subcutaneous TID WC  . insulin aspart  0-5 Units Subcutaneous QHS  . ipratropium  0.5 mg Nebulization Q4H  . micafungin (MYCAMINE) IV  100 mg Intravenous Q24H  . pantoprazole  40 mg Oral Daily  . potassium chloride  40 mEq Oral BID  . pyridOXINE  100 mg Oral QPM  . vitamin B-12  1,000 mcg Oral QPM  .  vitamin E  400 Units Oral Daily    have reviewed scheduled and prn medications.  Physical Exam: General: alert, NAD Heart: RRR Lungs: decreased BS at bases Abdomen: distended Extremities: pitting edema    08/14/2014,9:11 AM  LOS: 39 days

## 2014-08-14 NOTE — Progress Notes (Signed)
No change in d/c plan at this time.  When stable- patient will return to [facility] at [facility] level. CSW will continue to monitor and provide support.  Lorie Phenix. Pauline Good, [place]

## 2014-08-14 NOTE — Progress Notes (Addendum)
Subjective:   Volume status up. Yesterday HD cath and TLC removed due to positive cultures. L Thoracentesis.-- Yielded 1.3 liters . ID consulted. Started on amiodarone drip with uncontrolled A fib. + Blood cultures--->Yeast  Urine Cultures---> yeast. On Micafungin.   On ampicillic /cetriaxone for enterrococcus  Platelets 112k-> 37k-> 29k-> 41k>64>pending.  Denies SOB.    Objective:   Scheduled Meds: . ampicillin (OMNIPEN) IV  2 g Intravenous 3 times per day  . antiseptic oral rinse  7 mL Mouth Rinse BID  . aspirin EC  81 mg Oral Daily  . atorvastatin  10 mg Oral QPM  . cefTRIAXone (ROCEPHIN)  IV  2 g Intravenous Q12H  . darbepoetin (ARANESP) injection - NON-DIALYSIS  100 mcg Subcutaneous Q Wed-HD  . feeding supplement (ENSURE COMPLETE)  237 mL Oral BID BM  . folic acid  2 mg Oral Daily  . IMMUNE GLOBULIN 10% (HUMAN) IV - For Fluid Restriction Only  400 mg/kg Intravenous Q24H  . [START ON 08/15/2014] Influenza vac split quadrivalent PF  0.5 mL Intramuscular Tomorrow-1000  . insulin aspart  0-15 Units Subcutaneous TID WC  . insulin aspart  0-5 Units Subcutaneous QHS  . ipratropium  0.5 mg Nebulization Q4H  . micafungin (MYCAMINE) IV  100 mg Intravenous Q24H  . pantoprazole  40 mg Oral Daily  . potassium chloride  40 mEq Oral BID  . pyridOXINE  100 mg Oral QPM  . vitamin B-12  1,000 mcg Oral QPM  . vitamin E  400 Units Oral Daily   Continuous Infusions: . amiodarone 30 mg/hr (08/14/14 0600)  . milrinone 0.25 mcg/kg/min (08/14/14 0600)   PRN Meds:.acetaminophen, levalbuterol, ondansetron (ZOFRAN) IV, ondansetron (ZOFRAN) IV, ondansetron, promethazine, traMADol, zolpidem  Filed Vitals:   08/14/14 0300 08/14/14 0400 08/14/14 0500 08/14/14 0600  BP: 121/84 137/73 122/99 111/71  Pulse: 110     Temp: 97.9 F (36.6 C)     TempSrc: Oral     Resp: 19 16 22 24   Height:      Weight:      SpO2: 99% 98% 100% 97%    Intake/Output from previous day:  Intake/Output Summary  (Last 24 hours) at 08/14/14 0725 Last data filed at 08/14/14 0600  Gross per 24 hour  Intake 2237.2 ml  Output   3099 ml  Net -861.8 ml    Physical Exam: Physical exam:  Dyspneic talking.  Skin is warm and dry.  HEENT is normal.  Neck is supple. JVP ~10   Chest bilateral rhonchi . Pacemaker site with no hematoma Cardiovascular exam is regular rate and rhythm. 2/6 systolic murmur Abdominal exam nontender or distended. No masses palpated. Extremities show . 1+ edema. LEs wrapped neuro grossly intact    Lab Results: Basic Metabolic Panel:  Recent Labs  08/12/14 0410  08/13/14 0410 08/13/14 1600  NA 133*  < > 134* 135*  K 3.7  < > 4.4 5.0  CL 91*  < > 96 97  CO2 29  < > 27 25  GLUCOSE 95  < > 96 118*  BUN 54*  < > 37* 32*  CREATININE 2.20*  < > 2.03* 2.26*  CALCIUM 8.2*  < > 8.0* 7.9*  MG 2.1  --  2.2  --   PHOS 3.7  < > 2.6 2.3  < > = values in this interval not displayed. CBC:  Recent Labs  08/12/14 0410 08/13/14 0410  WBC 6.7 6.3  HGB 10.0* 9.4*  HCT 31.0* 30.0*  MCV 97.8 100.0  PLT 41* 64*     Assessment/Plan:  1. Status post aortic root replacement, bioprosthetic aortic valve replacement, bioprosthetic mitral valve replacement, coronary artery bypass graft (to LCx system) and maze-Continue rehab. 2. Acute on chronic kidney failure-nephrology following. Significantly volume overloaded, getting CVVH. 3. Thrombocytopenia-improved. Suspect ITP.  Hematology following. 4. Paroxysmal atrial fibrillation-patient is in AV paced rhythm today. Continue amiodarone drip for now. Coumadin held for thrombocytopenia. 5. CAD-continue aspirin and statin. Had SVG-LCx system with recent cardiac surgery.  6. Status post pacemaker for CHB 7. Enterococcal endocarditis-continue antibiotics; will need fu with ID 8. Acute diastolic CHF with prominent RV failure.  He is on milrinone gtt for RV support.  EF 50-55% on last echo.  9. Acute respiratory failure 10. UTI- yeast. To  start diflucan 11. Pleural effusions: s/p thoracentesis on right.  12. Blood/Urine  Cultures--> Yeast  ID following. Will TEE at some point.   OFF CVVHD until catheter replace.  Will continue milrinone as tolerated.Labs pending.  CLEGG,AMY, NP-C  7:25 AM   Patient seen and examined with Darrick Grinder, NP. We discussed all aspects of the encounter. I agree with the assessment and plan as stated above.   He now has fungemia. On micafungin. Agree with Dr. Roxy Manns that he likely is not good TEE candidate currently and probably wouldn't change our management at this point as he is not a re-operative candidate. Will get TTE. Continue abx and antifungals.   He is 24 pounds down after CVVHD but catheter now out and urine output slowing down and creatinine climbing back up. We will need to discuss with Renal what plan is for him with regard to renal failure as renal recovery looks tenuous. D/w Dr. Stanford Breed. Prognosis quite concerning.   Benay Spice 8:35 AM

## 2014-08-14 NOTE — Progress Notes (Addendum)
[facility] DAILY ICU PROGRESS NOTE                   [address]            [place] [address]          [id number]   14 Days Post-Op Procedure(s) (LRB): PERMANENT PACEMAKER INSERTION (N/A)  Total Length of Stay:  LOS: 39 days   Subjective: Feels fairly well overall   Objective: Vital signs in last 24 hours: Temp:  [97.9 F (36.6 C)-99.4 F (37.4 C)] 98.4 F (36.9 C) (09/11 0723) Pulse Rate:  [65-110] 110 (09/11 0300) Cardiac Rhythm:  [-] Ventricular paced;Atrial fibrillation (09/10 2130) Resp:  [16-33] 31 (09/11 0723) BP: (86-146)/(46-99) 124/68 mmHg (09/11 0723) SpO2:  [93 %-100 %] 95 % (09/11 0723) Weight:  [197 lb 1.5 oz (89.4 kg)] 197 lb 1.5 oz (89.4 kg) (09/11 0255)  Filed Weights   08/12/14 0413 08/13/14 0500 08/14/14 0255  Weight: 220 lb 14.4 oz (100.2 kg) 196 lb 10.4 oz (89.2 kg) 197 lb 1.5 oz (89.4 kg)    Weight change: 7.1 oz (0.2 kg)   Hemodynamic parameters for last 24 hours: CVP:  [13 mmHg] 13 mmHg  Intake/Output from previous day: 09/10 0701 - 09/11 0700 In: 2237.2 [P.O.:900; I.V.:987.2; IV Piggyback:350] Out: 3129 [Urine:345]  Intake/Output this shift:    Current Meds: Scheduled Meds: . ampicillin (OMNIPEN) IV  2 g Intravenous 3 times per day  . antiseptic oral rinse  7 mL Mouth Rinse BID  . aspirin EC  81 mg Oral Daily  . atorvastatin  10 mg Oral QPM  . cefTRIAXone (ROCEPHIN)  IV  2 g Intravenous Q12H  . darbepoetin (ARANESP) injection - NON-DIALYSIS  100 mcg Subcutaneous Q Wed-HD  . feeding supplement (ENSURE COMPLETE)  237 mL Oral BID BM  . folic acid  2 mg Oral Daily  . IMMUNE GLOBULIN 10% (HUMAN) IV - For Fluid Restriction Only  400 mg/kg Intravenous Q24H  . [START ON 08/15/2014] Influenza vac split quadrivalent PF  0.5 mL Intramuscular Tomorrow-1000  . insulin aspart  0-15 Units Subcutaneous TID WC  . insulin aspart  0-5 Units Subcutaneous QHS  . ipratropium  0.5 mg Nebulization Q4H  . micafungin (MYCAMINE) IV  100 mg  Intravenous Q24H  . pantoprazole  40 mg Oral Daily  . potassium chloride  40 mEq Oral BID  . pyridOXINE  100 mg Oral QPM  . vitamin B-12  1,000 mcg Oral QPM  . vitamin E  400 Units Oral Daily   Continuous Infusions: . amiodarone 30 mg/hr (08/14/14 0600)  . milrinone 0.25 mcg/kg/min (08/14/14 0600)   PRN Meds:.acetaminophen, levalbuterol, ondansetron (ZOFRAN) IV, ondansetron (ZOFRAN) IV, ondansetron, promethazine, traMADol, zolpidem  General appearance: alert, cooperative and no distress Heart: regular rate and rhythm Lungs: dim in bases Abdomen: benign Extremities: + edema Wound: incis healing well  Lab Results: CBC: Recent Labs  08/13/14 0410 08/14/14 0645  WBC 6.3 5.9  HGB 9.4* 9.4*  HCT 30.0* 30.3*  PLT 64* 65*   BMET:  Recent Labs  08/13/14 0410 08/13/14 1600  NA 134* 135*  K 4.4 5.0  CL 96 97  CO2 27 25  GLUCOSE 96 118*  BUN 37* 32*  CREATININE 2.03* 2.26*  CALCIUM 8.0* 7.9*    PT/INR:  Recent Labs  08/14/14 0645  LABPROT 16.7*  INR 1.35   Radiology: Dg Chest Port 1 View  08/14/2014   CLINICAL DATA:  Pleural effusions PORTABLE CHEST -  1 VIEW  COMPARISON:  08/13/2014.  08/12/2014.  FINDINGS: Interim removal of dialysis catheter. Cardiomegaly. Cardiac pacer lead tips in the right atrium and right ventricle. Prior CABG. Prior cardiac valve replacement. Atrial appendage clip. Bilateral pulmonary alveolar infiltrates are present. These are more prominent than on prior exam and are consistent with congestive heart failure and pulmonary edema. Bilateral pleural effusions are noted. No pneumothorax. No acute osseus abnormality.  IMPRESSION: 1. Interim removal of dialysis catheter. 2. Prior CABG. Prior cardiac valve replacement. Left atrial appendage clip. Cardiac pacer. 3. Congestive heart failure with bilateral pulmonary edema and small bilateral pleural effusions. These findings have progressed from prior exam.   Electronically Signed   By: [place]   On:  08/14/2014 07:32   Dg Chest Portable 1 View  08/13/2014   CLINICAL DATA:  Thoracentesis.  EXAM: PORTABLE CHEST - 1 VIEW  COMPARISON:  Chest x-ray 08/12/2014.  FINDINGS: Left IJ dialysis catheter. Cardiomegaly. Prior cardiac valve replacement. Left atrial clips. Prior CABG. Pulmonary vascularity is normal. Bibasilar atelectasis and/or infiltrates. Interval reduction in left-sided pleural effusion. No pneumothorax.  IMPRESSION: 1. No evidence of pneumothorax post thoracentesis. 2. Interim improvement of left pleural effusion. Bibasilar atelectasis and infiltrates again noted. 3. Stable cardiomegaly. Prior cardiac valve replacement. Prior CABG. Left atrial appendage clip noted. Cardiac pacer in stable position. 4. Dialysis catheter noted in stable position.   Electronically Signed   By: Marcello Moores  Register   On: 08/13/2014 15:11   Dg Chest Port 1 View  08/12/2014   CLINICAL DATA:  Post right thoracentesis  EXAM: PORTABLE CHEST - 1 VIEW  COMPARISON:  08/12/2014  FINDINGS: Interval decrease in right pleural effusion following thoracentesis. No pneumothorax  Moderate left effusion and left lower lobe infiltrate unchanged.  Improved aeration in the right lung base  Left jugular central venous catheter tips in the SVC also unchanged. Prior heart surgery and pacemaker placement.  IMPRESSION: Negative for pneumothorax post right thoracentesis. Improved aeration right lung base  Left pleural effusion and left lower lobe infiltrate unchanged.   Electronically Signed   By: Franchot Gallo M.D.   On: 08/12/2014 16:50     Assessment/Plan: S/P Procedure(s) (LRB): PERMANENT PACEMAKER INSERTION (N/A)  1 events of yesterday noted, overall feeling a bit better. Will need lines replaced 2 platelets stable 3 ID on board adjusting antibiotics 4 nephrology managing renal issues - prob restart CRRT 5 CCM assisting with multiple issues  GOLD,WAYNE E 08/14/2014 7:53 AM   I have seen and examined the patient and agree with  the assessment and plan as outlined.  I would favor leaving Foley catheter out if possible, but defer management to CCM team.  Ultimately may need Pleur-X catheter on one or both sides if pleural effusions recur.  For HD catheter replacement later today per Dr Titus Mould.   Dejae Bernet H 08/14/2014 10:07 AM

## 2014-08-14 NOTE — Consult Note (Addendum)
PULMONARY / CRITICAL CARE MEDICINE   Name: Justin [name] MRN: [id number] DOB: 1933/09/03    ADMISSION DATE:  07/21/2014 CONSULTATION DATE:  08/11/14  REFERRING MD :  Dr. Haroldine Laws   CHIEF COMPLAINT:  Volume overload, respiratory failure.   INITIAL PRESENTATION: 80 y/o M initially admitted 8/3 with dyspnea, fever.  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  Had CABG with AVR/MVR.  Developed acute respiratory failure and renal failure with hypervolemia.  PCCM consulted to assist with management of respiratory failure.  STUDIES:  8/03  CTA Chest >> no PE, bilateral effusions, extensive GGO, nodular opacities (largest 44mm) 8/04  ECHO >> EF 50-55%, Grade 2 DD, mod AI, mod-severe MR/TR, severe pulmonary HTN PA peak 65 8/06  CT ABD/Pelvis >> no source for bacteremia, anasarca, mod-large pleural effusions 8/06  CT Head >> negative for acute abnormality 8/11  TTE >> nml LV fxn, severe MR, large aortic valve vegetation w mod AI, no pacemaker vegetation identified 8/12  HIT >> neg  9/02  ECHO >> EF 50-55%, no wma, bioprosthesis of AV/MV, mod TR, PA peak 44  SIGNIFICANT EVENTS: 8/03  Admit for sepsis 8/11  Removal of Medtronic dual-chamber pacemaker secondary to enterococcal endoaortitis  8/14  LHC >> heavily calcified ostial circumflex with 70% stenosis, otherwise no critical stenosis 8/21  AVR, Aortic Root replacement, MVR, CABG x1, MAZE procedure per Dr. Roxy Manns 8/22  Extubation 8/28  Implantation of Medtronic dual-chamber pacemaker for symptomatic bradycardia / CHB 9/08  Worsening respiratory status, desaturations, tx to ICU, CVVHD started 9/9 improved after thoracentesis and neg balance on cvvhd  Subjective: no pressors, off cvvhd, less SOB  VITAL SIGNS: Temp:  [97.9 F (36.6 C)-99.4 F (37.4 C)] 98.4 F (36.9 C) (09/11 0723) Pulse Rate:  [65-110] 110 (09/11 0300) Resp:  [16-33] 18 (09/11 0900) BP: (86-146)/(46-99) 112/64 mmHg (09/11 0900) SpO2:  [93 %-100 %] 100 %  (09/11 0900) Weight:  [89.4 kg (197 lb 1.5 oz)] 89.4 kg (197 lb 1.5 oz) (09/11 0255)  HEMODYNAMICS: CVP:  [13 mmHg] 13 mmHg  VENTILATOR SETTINGS:   INTAKE / OUTPUT:  Intake/Output Summary (Last 24 hours) at 08/14/14 0914 Last data filed at 08/14/14 0900  Gross per 24 hour  Intake 2034.4 ml  Output   2405 ml  Net -370.6 ml    PHYSICAL EXAMINATION: General: pleasant, wob wnl Neuro:  Alert, normal strength, CN intact HEENT:  jvd remains Cardiovascular:  s1 s2 Irregular, 2/6 SM unchanged in charachter Lungs: coarse ronchi Abdomen:  Soft, non tender, + bowel sounds Musculoskeletal:  2+ edema, reduced Skin:  No rashes  LABS:  CBC  Recent Labs Lab 08/12/14 0410 08/13/14 0410 08/14/14 0645  WBC 6.7 6.3 5.9  HGB 10.0* 9.4* 9.4*  HCT 31.0* 30.0* 30.3*  PLT 41* 64* 65*   Coag's  Recent Labs Lab 08/12/14 0410 08/13/14 0410 08/14/14 0645  APTT 45* 46*  --   INR 1.50* 1.42 1.35   BMET  Recent Labs Lab 08/13/14 0410 08/13/14 1600 08/14/14 0645  NA 134* 135* 130*  K 4.4 5.0 5.0  CL 96 97 94*  CO2 27 25 24   BUN 37* 32* 37*  CREATININE 2.03* 2.26* 2.71*  GLUCOSE 96 118* 105*   Electrolytes  Recent Labs Lab 08/12/14 0410 08/12/14 1600 08/13/14 0410 08/13/14 1600 08/14/14 0645  CALCIUM 8.2* 8.2* 8.0* 7.9* 8.0*  MG 2.1  --  2.2  --   --   PHOS 3.7 3.1 2.6 2.3  --  ABG No results found for this basename: PHART, PCO2ART, PO2ART,  in the last 168 hours  Liver Enzymes  Recent Labs Lab 08/10/14 0507  08/12/14 1600 08/13/14 0410 08/13/14 1600  AST 54*  --   --   --   --   ALT 25  --   --   --   --   ALKPHOS 136*  --   --   --   --   BILITOT 0.3  --   --   --   --   ALBUMIN 2.0*  < > 2.2* 2.2* 2.1*  < > = values in this interval not displayed. Cardiac Enzymes No results found for this basename: TROPONINI, PROBNP,  in the last 168 hours Glucose  Recent Labs Lab 08/12/14 2139 08/13/14 0740 08/13/14 1136 08/13/14 1634 08/13/14 2122  08/14/14 0727  GLUCAP 243* 97 147* 134* 134* 106*    Imaging Dg Chest Portable 1 View  08/13/2014   CLINICAL DATA:  Thoracentesis.  EXAM: PORTABLE CHEST - 1 VIEW  COMPARISON:  Chest x-ray 08/12/2014.  FINDINGS: Left IJ dialysis catheter. Cardiomegaly. Prior cardiac valve replacement. Left atrial clips. Prior CABG. Pulmonary vascularity is normal. Bibasilar atelectasis and/or infiltrates. Interval reduction in left-sided pleural effusion. No pneumothorax.  IMPRESSION: 1. No evidence of pneumothorax post thoracentesis. 2. Interim improvement of left pleural effusion. Bibasilar atelectasis and infiltrates again noted. 3. Stable cardiomegaly. Prior cardiac valve replacement. Prior CABG. Left atrial appendage clip noted. Cardiac pacer in stable position. 4. Dialysis catheter noted in stable position.   Electronically Signed   By: Marcello Moores  Register   On: 08/13/2014 15:11     ASSESSMENT / PLAN:  PULMONARY A: Acute Hypoxic Respiratory Failure - in setting of volume overload / CHF / b/l pleural effusions. Right greater left effusions, s/p thora rt 1.350 liters 9/9, thora left 1.1 liters Hx of OSA on CPAP as outpt. P:   CXR in am, infiltrates / edema increasing already Neg balance goals, will replace HD cath in afternoon, see renal CPAP nocturnal  IS as able  CARDIOVASCULAR PICC 8/28 >> 9/10 HD 9/8>>>9/10 A:  CAD s/p CABG x1 with MVR/AVR - 8/21 per Dr. Roxy Manns.  PAF - controlled, not on anticoagulation. CHB s/p Pacemaker - 8/28 per Dr. Lovena Le. P:  Continue Amiodarone, ASA, Lipitor Milrinone gtts per CHF SVC May need further fib rate control, EP on case, pacer adjustment>? Map goal 60 resp status would not allow TEE as of now  RENAL A:   Acute Kidney failure Mild rise K with cvvhd on hold  Hyponatremia in setting of hypervolemia.  P:   k noted, would not risk further delays cvvhd for longer holiday with K rise and lack of sig urine output Place HD cath 2 pm, then cvvhd  restart  GASTROINTESTINAL A:   Protein Calorie Malnutrition - in setting of critical illness. P:   diet and advancement PPI for SUP  HEMATOLOGIC A:   Anemia of critical illness. Thrombocytopenia - fungemia driven , concern for ITP contribtion P:  Plat rising, treat fungemia IVIG maintain Cbc in am  Consider sub q hep in am if plat rise further  INFECTIOUS A:   Enterococcal Endocarditis. Fungemia, r/o picc vs urine as source, valves at risk UTI, foley related, yeast as well P:   BCx2 9/8 >> UC 9/8 >>yeast 65 k  Blood 9/8 >>>yeast Pleural rt 9/9>>>  afb neg  Yeast>>> pleural 9/10 left>>>  Ampicillin, start date 8/08>>> Rocephin, start date 8/23>>> Diflucan 9/10>>>9/10  caspofungin 9/10>>>  TEE role if not surgical candidate? Unable as resp status marginal as of now, likely just gets treated as if valve infected , duration Has a new foley TTE today Repeat BC today  ENDOCRINE A:   Hyperglycemia - in setting of critical illness + steroid administration  P:   SSI  Cortisol if drops BP  NEUROLOGIC A:   TIA 8/06 >> resolved Mild delirium P: Avoid benzo PT attempt cvvhd restart Mobilize as able  TODAY'S SUMMARY: now with fungemia, cvvhd holida, all catheters out, restart cvvhd in afternoon, caspo, no tee unstable  Ccm time 30 min  multisystem involvement, complex decision making  Lavon Paganini. Titus Mould, MD, [place] Pgr: [facility]

## 2014-08-14 NOTE — Progress Notes (Addendum)
INFECTIOUS DISEASE PROGRESS NOTE  ID: Justin [name] is a 80 y.o. 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 Active Problems:   CAD (coronary artery disease)   HTN (hypertension)   Atrial fibrillation   Anemia   Thrombocytopenia   Obstructive sleep apnea   Chronotropic incompetence with sinus node dysfunction   Mitral regurgitation   SOB (shortness of breath)   CHF (congestive heart failure)   Diastolic congestive heart failure   Sepsis   Enterococcal bacteremia   TIA (transient ischemic attack)   Aortic insufficiency   Chronic kidney disease   S/P aortic valve replacement with stentless valve   S/P mitral valve replacement with bioprosthetic valve   S/P CABG x 1   S/P Maze operation for atrial fibrillation   Acute renal failure superimposed on stage 3 chronic kidney disease   Acute on chronic diastolic heart failure   Fungemia  Subjective: In good spirits, breathing is "fair"  Abtx:  Anti-infectives   Start     Dose/Rate Route Frequency Ordered Stop   08/13/14 1500  micafungin (MYCAMINE) 100 mg in sodium chloride 0.9 % 100 mL IVPB     100 mg 100 mL/hr over 1 Hours Intravenous Every 24 hours 08/13/14 1421     08/13/14 1430  micafungin (MYCAMINE) 100 mg in sodium chloride 0.9 % 100 mL IVPB  Status:  Discontinued     100 mg 100 mL/hr over 1 Hours Intravenous Daily 08/13/14 1421 08/13/14 1422   08/13/14 1000  fluconazole (DIFLUCAN) IVPB 100 mg  Status:  Discontinued     100 mg 50 mL/hr over 60 Minutes Intravenous Every 24 hours 08/13/14 0856 08/13/14 1421   07/21/2014 2200  ceFAZolin (ANCEF) IVPB 1 g/50 mL premix  Status:  Discontinued     1 g 100 mL/hr over 30 Minutes Intravenous 3 times per day 07/27/2014 1904 08/01/14 1252   07/11/2014 1915  gentamicin (GARAMYCIN) 80 mg in sodium chloride irrigation 0.9 % 500 mL irrigation  Status:  Discontinued     80 mg Irrigation On call 07/20/2014 1904 07/27/2014 1913   07/09/2014 1915  ceFAZolin (ANCEF) IVPB 2 g/50 mL premix  Status:  Discontinued     2 g 100 mL/hr over 30 Minutes Intravenous On call 07/19/2014 1904 07/24/2014 1913   07/13/2014 1315  gentamicin (GARAMYCIN) 80 mg in sodium chloride irrigation 0.9 % 500 mL irrigation  Status:  Discontinued      Irrigation To Cath Lab 07/14/2014 1313 08/01/14 0955   07/23/2014 1306  ceFAZolin (ANCEF) 2-3 GM-% IVPB SOLR    Comments:  Nadal, Cindy   : cabinet override      07/29/2014 1306 07/10/2014 1306   07/27/14 1500  ampicillin (OMNIPEN) 2 g in sodium chloride 0.9 % 50 mL IVPB     2 g 150 mL/hr over 20 Minutes Intravenous 3 times per day 07/27/14 1116     07/26/14 1200  cefTRIAXone (ROCEPHIN) 2 g in dextrose 5 % 50 mL IVPB     2 g 100 mL/hr over 30 Minutes Intravenous Every 12 hours 07/26/14 1131     07/25/14 0000  cefUROXime (ZINACEF) 1.5 g in dextrose 5 % 50 mL IVPB  Status:  Discontinued     1.5 g 100 mL/hr over 30 Minutes Intravenous Every 12 hours 07/04/2014 2004 07/26/14 1131   07/11/2014 2200  vancomycin (VANCOCIN) IVPB 1000 mg/200 mL premix     1,000 mg 200 mL/hr over 60  Minutes Intravenous  Once 07/19/2014 2004 07/15/2014 2346   07/19/2014 1745  vancomycin (VANCOCIN) 1,000 mg in sodium chloride 0.9 % 1,000 mL irrigation      Irrigation To Surgery 07/06/2014 1731 07/22/2014 1802   07/10/2014 0400  vancomycin (VANCOCIN) 1,250 mg in sodium chloride 0.9 % 250 mL IVPB     1,250 mg 166.7 mL/hr over 90 Minutes Intravenous To Surgery 07/23/14 1114 07/27/2014 0740   07/14/2014 0400  cefUROXime (ZINACEF) 1.5 g in dextrose 5 % 50 mL IVPB     1.5 g 100 mL/hr over 30 Minutes Intravenous To Surgery 07/23/14 1114 07/31/2014 1509   07/13/2014 0400  cefUROXime (ZINACEF) 750 mg in dextrose 5 % 50 mL IVPB  Status:  Discontinued     750 mg 100 mL/hr over 30 Minutes Intravenous To Surgery 07/23/14 1114 07/15/2014 1944   07/19/2014 0400  vancomycin (VANCOCIN) 1,000 mg in sodium chloride 0.9 % 1,000 mL irrigation      Irrigation To Surgery 07/23/14 1921  07/17/2014 1058   07/16/14 1300  [MAR Hold]  gentamicin (GARAMYCIN) IVPB 100 mg  Status:  Discontinued     (On MAR Hold since 07/19/2014 0627)   100 mg 200 mL/hr over 30 Minutes Intravenous Every 24 hours 07/15/14 1917 07/18/2014 1944   07/22/2014 1045  gentamicin (GARAMYCIN) 80 mg in sodium chloride irrigation 0.9 % 500 mL irrigation  Status:  Discontinued     80 mg Irrigation On call 07/31/2014 1031 07/15/2014 1805   07/12/14 1300  gentamicin (GARAMYCIN) IVPB 80 mg  Status:  Discontinued     80 mg 100 mL/hr over 30 Minutes Intravenous Every 24 hours 07/12/14 1200 07/15/14 1917   07/11/14 1200  ampicillin (OMNIPEN) 2 g in sodium chloride 0.9 % 50 mL IVPB  Status:  Discontinued     2 g 150 mL/hr over 20 Minutes Intravenous 4 times per day 07/11/14 0945 07/27/14 1116   07/07/14 0030  piperacillin-tazobactam (ZOSYN) IVPB 3.375 g  Status:  Discontinued     3.375 g 12.5 mL/hr over 240 Minutes Intravenous Every 8 hours 07/26/2014 2042 07/08/14 1602   07/31/2014 2300  levofloxacin (LEVAQUIN) IVPB 750 mg  Status:  Discontinued     750 mg 100 mL/hr over 90 Minutes Intravenous Every 48 hours 07/05/2014 2232 07/08/14 1022   07/14/2014 1615  piperacillin-tazobactam (ZOSYN) IVPB 3.375 g     3.375 g 100 mL/hr over 30 Minutes Intravenous  Once 07/16/2014 1604 07/28/2014 1755   07/22/2014 1615  vancomycin (VANCOCIN) IVPB 1000 mg/200 mL premix     1,000 mg 200 mL/hr over 60 Minutes Intravenous  Once 07/25/2014 1604 07/23/14 0709   07/05/2014 0030  vancomycin (VANCOCIN) IVPB 750 mg/150 ml premix  Status:  Discontinued     750 mg 150 mL/hr over 60 Minutes Intravenous Every 12 hours 07/25/2014 2042 07/11/14 0945      Medications:  Scheduled: . ampicillin (OMNIPEN) IV  2 g Intravenous 3 times per day  . antiseptic oral rinse  7 mL Mouth Rinse BID  . aspirin EC  81 mg Oral Daily  . atorvastatin  10 mg Oral QPM  . cefTRIAXone (ROCEPHIN)  IV  2 g Intravenous Q12H  . darbepoetin (ARANESP) injection - NON-DIALYSIS  100 mcg Subcutaneous  Q Wed-HD  . feeding supplement (ENSURE COMPLETE)  237 mL Oral BID BM  . folic acid  2 mg Oral Daily  . furosemide  160 mg Intravenous Q6H  . IMMUNE GLOBULIN 10% (HUMAN) IV - For Fluid Restriction Only  400 mg/kg Intravenous Q24H  . [START ON 08/15/2014] Influenza vac split quadrivalent PF  0.5 mL Intramuscular Tomorrow-1000  . insulin aspart  0-15 Units Subcutaneous TID WC  . insulin aspart  0-5 Units Subcutaneous QHS  . ipratropium  0.5 mg Nebulization Q4H  . micafungin (MYCAMINE) IV  100 mg Intravenous Q24H  . pantoprazole  40 mg Oral Daily  . potassium chloride  40 mEq Oral BID  . pyridOXINE  100 mg Oral QPM  . vitamin B-12  1,000 mcg Oral QPM  . vitamin E  400 Units Oral Daily    Objective: Vital signs in last 24 hours: Temp:  [97.9 F (36.6 C)-99.4 F (37.4 C)] 98.4 F (36.9 C) (09/11 0723) Pulse Rate:  [65-110] 110 (09/11 0300) Resp:  [16-33] 18 (09/11 0900) BP: (86-146)/(46-99) 112/64 mmHg (09/11 0900) SpO2:  [93 %-100 %] 100 % (09/11 0900) Weight:  [89.4 kg (197 lb 1.5 oz)] 89.4 kg (197 lb 1.5 oz) (09/11 0255)   General appearance: alert, cooperative and no distress Resp: diminished breath sounds bilaterally Chest wall: no tenderness, pacer site clean.  Cardio: regular rate and rhythm GI: normal findings: bowel sounds normal and soft, non-tender  Lab Results  Recent Labs  08/13/14 0410 08/13/14 1600 08/14/14 0645  WBC 6.3  --  5.9  HGB 9.4*  --  9.4*  HCT 30.0*  --  30.3*  NA 134* 135* 130*  K 4.4 5.0 5.0  CL 96 97 94*  CO2 27 25 24   BUN 37* 32* 37*  CREATININE 2.03* 2.26* 2.71*   Liver Panel  Recent Labs  08/12/14 1600 08/13/14 0410 08/13/14 1600  PROT 7.4  --   --   ALBUMIN 2.2* 2.2* 2.1*   Sedimentation Rate No results found for this basename: ESRSEDRATE,  in the last 72 hours C-Reactive Protein No results found for this basename: CRP,  in the last 72 hours  Microbiology: Recent Results (from the past 240 hour(s))  CULTURE, BLOOD  (ROUTINE X 2)     Status: None   Collection Time    08/11/14  8:35 AM      Result Value Ref Range Status   Specimen Description BLOOD RIGHT HAND   Final   Special Requests BOTTLES DRAWN AEROBIC ONLY 4CC   Final   Culture  Setup Time     Final   Value: 08/11/2014 14:03     Performed at [facility]   Culture     Final   Value:        BLOOD CULTURE RECEIVED NO GROWTH TO DATE CULTURE WILL BE HELD FOR 5 DAYS BEFORE ISSUING A FINAL NEGATIVE REPORT     Performed at [facility]   Report Status PENDING   Incomplete  CULTURE, BLOOD (ROUTINE X 2)     Status: None   Collection Time    08/11/14  8:45 AM      Result Value Ref Range Status   Specimen Description BLOOD RIGHT HAND   Final   Special Requests BOTTLES DRAWN AEROBIC ONLY [facility]   Final   Culture  Setup Time     Final   Value: 08/11/2014 14:03     Performed at [facility]   Culture     Final   Value: YEAST     Note: Gram Stain Report Called to,Read Back By and Verified With: CRYSTAL RICE 08/13/14 1120 BY SMITHERSJ     Performed at [facility]   Report Status PENDING  Incomplete  URINE CULTURE     Status: None   Collection Time    08/11/14 11:44 AM      Result Value Ref Range Status   Specimen Description URINE, CATHETERIZED   Final   Special Requests Immunocompromised   Final   Culture  Setup Time     Final   Value: 08/11/2014 17:21     Performed at [place]     Final   Value: 65,000 COLONIES/ML     Performed at [facility]   Culture     Final   Value: YEAST     Performed at [facility]   Report Status 08/12/2014 FINAL   Final  AFB CULTURE WITH SMEAR     Status: None   Collection Time    08/12/14  4:33 PM      Result Value Ref Range Status   Specimen Description FLUID RIGHT PLEURAL   Final   Special Requests Normal   Final   Acid Fast Smear     Final   Value: NO ACID FAST BACILLI SEEN     Performed at [facility]   Culture     Final     Value: CULTURE WILL BE EXAMINED FOR 6 WEEKS BEFORE ISSUING A FINAL REPORT     Performed at [facility]   Report Status PENDING   Incomplete  BODY FLUID CULTURE     Status: None   Collection Time    08/12/14  4:42 PM      Result Value Ref Range Status   Specimen Description FLUID RIGHT PLEURAL   Final   Special Requests Normal   Final   Gram Stain     Final   Value: RARE WBC PRESENT,BOTH PMN AND MONONUCLEAR     NO ORGANISMS SEEN     Performed at [facility]   Culture     Final   Value: NO GROWTH     Performed at [facility]   Report Status PENDING   Incomplete  FUNGUS CULTURE W SMEAR     Status: None   Collection Time    08/12/14  4:52 PM      Result Value Ref Range Status   Specimen Description FLUID RIGHT PLEURAL   Final   Special Requests NONE   Final   Fungal Smear     Final   Value: NO YEAST OR FUNGAL ELEMENTS SEEN     Performed at [facility]   Culture     Final   Value: CULTURE IN PROGRESS FOR FOUR WEEKS     Performed at [facility]   Report Status PENDING   Incomplete  BODY FLUID CULTURE     Status: None   Collection Time    08/13/14  3:00 PM      Result Value Ref Range Status   Specimen Description PLEURAL FLUID LEFT   Final   Special Requests NONE   Final   Gram Stain     Final   Value: NO WBC SEEN     NO ORGANISMS SEEN     Performed at [facility]   Culture PENDING   Incomplete   Report Status PENDING   Incomplete  CULTURE, EXPECTORATED SPUTUM-ASSESSMENT     Status: None   Collection Time    08/13/14  6:02 PM      Result Value Ref Range Status   Specimen Description SPUTUM  Final   Special Requests Immunocompromised   Final   Sputum evaluation     Final   Value: MICROSCOPIC FINDINGS SUGGEST THAT THIS SPECIMEN IS NOT REPRESENTATIVE OF LOWER RESPIRATORY SECRETIONS. PLEASE RECOLLECT.     Loletha Carrow RN 1941 08/13/14 A BROWNING   Report Status 08/13/2014 FINAL   Final    Studies/Results: Dg  Chest Port 1 View  08/14/2014   CLINICAL DATA:  Pleural effusions PORTABLE CHEST - 1 VIEW  COMPARISON:  08/13/2014.  08/12/2014.  FINDINGS: Interim removal of dialysis catheter. Cardiomegaly. Cardiac pacer lead tips in the right atrium and right ventricle. Prior CABG. Prior cardiac valve replacement. Atrial appendage clip. Bilateral pulmonary alveolar infiltrates are present. These are more prominent than on prior exam and are consistent with congestive heart failure and pulmonary edema. Bilateral pleural effusions are noted. No pneumothorax. No acute osseus abnormality.  IMPRESSION: 1. Interim removal of dialysis catheter. 2. Prior CABG. Prior cardiac valve replacement. Left atrial appendage clip. Cardiac pacer. 3. Congestive heart failure with bilateral pulmonary edema and small bilateral pleural effusions. These findings have progressed from prior exam.   Electronically Signed   By: [place]   On: 08/14/2014 07:32   Dg Chest Portable 1 View  08/13/2014   CLINICAL DATA:  Thoracentesis.  EXAM: PORTABLE CHEST - 1 VIEW  COMPARISON:  Chest x-ray 08/12/2014.  FINDINGS: Left IJ dialysis catheter. Cardiomegaly. Prior cardiac valve replacement. Left atrial clips. Prior CABG. Pulmonary vascularity is normal. Bibasilar atelectasis and/or infiltrates. Interval reduction in left-sided pleural effusion. No pneumothorax.  IMPRESSION: 1. No evidence of pneumothorax post thoracentesis. 2. Interim improvement of left pleural effusion. Bibasilar atelectasis and infiltrates again noted. 3. Stable cardiomegaly. Prior cardiac valve replacement. Prior CABG. Left atrial appendage clip noted. Cardiac pacer in stable position. 4. Dialysis catheter noted in stable position.   Electronically Signed   By: Marcello Moores  Register   On: 08/13/2014 15:11   Dg Chest Port 1 View  08/12/2014   CLINICAL DATA:  Post right thoracentesis  EXAM: PORTABLE CHEST - 1 VIEW  COMPARISON:  08/12/2014  FINDINGS: Interval decrease in right pleural  effusion following thoracentesis. No pneumothorax  Moderate left effusion and left lower lobe infiltrate unchanged.  Improved aeration in the right lung base  Left jugular central venous catheter tips in the SVC also unchanged. Prior heart surgery and pacemaker placement.  IMPRESSION: Negative for pneumothorax post right thoracentesis. Improved aeration right lung base  Left pleural effusion and left lower lobe infiltrate unchanged.   Electronically Signed   By: Franchot Gallo M.D.   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       Bobby Rumpf Infectious Diseases (pager) [phone] [email or web address] 08/14/2014, 10:14 AM  LOS: 39 days

## 2014-08-14 NOTE — Procedures (Signed)
Central Venous Catheter Insertion Procedure Note GEO SLONE [id number] January 08, 1933  Procedure: Insertion of Central Venous Catheter Indications: Drug and/or fluid administration and cvvhd  Procedure Details Consent: Risks of procedure as well as the alternatives and risks of each were explained to the (patient/caregiver).  Consent for procedure obtained. Time Out: Verified patient identification, verified procedure, site/side was marked, verified correct patient position, special equipment/implants available, medications/allergies/relevent history reviewed, required imaging and test results available.  Performed  Maximum sterile technique was used including antiseptics, cap, gloves, gown, hand hygiene, mask and sheet. Skin prep: Chlorhexidine; local anesthetic administered A antimicrobial bonded/coated triple lumen catheter was placed in the left internal jugular vein using the Seldinger technique.  Evaluation Blood flow good Complications: No apparent complications Patient did tolerate procedure well. Chest X-ray ordered to verify placement.  CXR: pending.  Raylene Miyamoto 08/14/2014, 3:41 PM  [place]  Lavon Paganini. Titus Mould, MD, [place] Pgr: [place] [facility]

## 2014-08-14 NOTE — Progress Notes (Signed)
Echocardiogram 2D Echocardiogram limited with Definity has been performed.  Justin [name] 08/14/2014, 11:08 AM

## 2014-08-14 NOTE — Progress Notes (Signed)
Report received. Patient called out and was ina state of confusion. He [place] [employer]. Patient appeared anxious and SOB noted. Reoriented patient to place,date,time and situation. Assessment completed. Diminished lung sounds present with coarse rhonic /rales present in lower lobes and posteriorly. Low UO noted. Called Dr. Oliver Pila and  Update on current condition. Orders received to give 40 mg IV lasix and monitor the situation.

## 2014-08-14 NOTE — Progress Notes (Signed)
Patient has low Urine output over several hours . Dr. Lake Bells called and made aware. Discussed previous interventions and patient's current condition. Patient resting comfortable in bed with daughter at bedside. No anxiety or distress in breathing noted. Will continue to monitor.

## 2014-08-15 ENCOUNTER — Inpatient Hospital Stay (HOSPITAL_COMMUNITY): Payer: Medicare Other

## 2014-08-15 DIAGNOSIS — A409 Streptococcal sepsis, unspecified: Secondary | ICD-10-CM | POA: Diagnosis not present

## 2014-08-15 LAB — COMPREHENSIVE METABOLIC PANEL
ALT: 25 U/L (ref 0–53)
ANION GAP: 10 (ref 5–15)
AST: 57 U/L — ABNORMAL HIGH (ref 0–37)
Albumin: 2 g/dL — ABNORMAL LOW (ref 3.5–5.2)
Alkaline Phosphatase: 117 U/L (ref 39–117)
BILIRUBIN TOTAL: 0.5 mg/dL (ref 0.3–1.2)
BUN: 29 mg/dL — AB (ref 6–23)
CHLORIDE: 96 meq/L (ref 96–112)
CO2: 26 mEq/L (ref 19–32)
Calcium: 8 mg/dL — ABNORMAL LOW (ref 8.4–10.5)
Creatinine, Ser: 2.06 mg/dL — ABNORMAL HIGH (ref 0.50–1.35)
GFR calc non Af Amer: 29 mL/min — ABNORMAL LOW (ref >90)
GFR, EST AFRICAN AMERICAN: 34 mL/min — AB (ref >90)
GLUCOSE: 92 mg/dL (ref 70–99)
POTASSIUM: 4.4 meq/L (ref 3.7–5.3)
Sodium: 132 mEq/L — ABNORMAL LOW (ref 137–147)
Total Protein: 8.3 g/dL (ref 6.0–8.3)

## 2014-08-15 LAB — CBC WITH DIFFERENTIAL/PLATELET
Basophils Absolute: 0.1 10*3/uL (ref 0.0–0.1)
Basophils Relative: 2 % — ABNORMAL HIGH (ref 0–1)
EOS ABS: 0.1 10*3/uL (ref 0.0–0.7)
Eosinophils Relative: 2 % (ref 0–5)
HCT: 29.3 % — ABNORMAL LOW (ref 39.0–52.0)
Hemoglobin: 9.1 g/dL — ABNORMAL LOW (ref 13.0–17.0)
LYMPHS PCT: 13 % (ref 12–46)
Lymphs Abs: 0.6 10*3/uL — ABNORMAL LOW (ref 0.7–4.0)
MCH: 31.9 pg (ref 26.0–34.0)
MCHC: 31.1 g/dL (ref 30.0–36.0)
MCV: 102.8 fL — ABNORMAL HIGH (ref 78.0–100.0)
Monocytes Absolute: 0.5 10*3/uL (ref 0.1–1.0)
Monocytes Relative: 10 % (ref 3–12)
NEUTROS ABS: 3.5 10*3/uL (ref 1.7–7.7)
NEUTROS PCT: 73 % (ref 43–77)
Platelets: 71 10*3/uL — ABNORMAL LOW (ref 150–400)
RBC: 2.85 MIL/uL — ABNORMAL LOW (ref 4.22–5.81)
RDW: 23 % — AB (ref 11.5–15.5)
WBC: 4.8 10*3/uL (ref 4.0–10.5)

## 2014-08-15 LAB — RETICULOCYTES
RBC.: 2.85 MIL/uL — ABNORMAL LOW (ref 4.22–5.81)
RETIC CT PCT: 5.6 % — AB (ref 0.4–3.1)
Retic Count, Absolute: 159.6 10*3/uL (ref 19.0–186.0)

## 2014-08-15 LAB — RENAL FUNCTION PANEL
Albumin: 2 g/dL — ABNORMAL LOW (ref 3.5–5.2)
Anion gap: 9 (ref 5–15)
BUN: 25 mg/dL — ABNORMAL HIGH (ref 6–23)
CALCIUM: 8 mg/dL — AB (ref 8.4–10.5)
CO2: 27 meq/L (ref 19–32)
CREATININE: 1.87 mg/dL — AB (ref 0.50–1.35)
Chloride: 97 mEq/L (ref 96–112)
GFR calc Af Amer: 38 mL/min — ABNORMAL LOW (ref >90)
GFR, EST NON AFRICAN AMERICAN: 33 mL/min — AB (ref >90)
Glucose, Bld: 68 mg/dL — ABNORMAL LOW (ref 70–99)
Phosphorus: 2.5 mg/dL (ref 2.3–4.6)
Potassium: 4.7 mEq/L (ref 3.7–5.3)
Sodium: 133 mEq/L — ABNORMAL LOW (ref 137–147)

## 2014-08-15 LAB — LACTATE DEHYDROGENASE: LDH: 587 U/L — ABNORMAL HIGH (ref 94–250)

## 2014-08-15 LAB — GLUCOSE, CAPILLARY
GLUCOSE-CAPILLARY: 97 mg/dL (ref 70–99)
GLUCOSE-CAPILLARY: 86 mg/dL (ref 70–99)
Glucose-Capillary: 177 mg/dL — ABNORMAL HIGH (ref 70–99)
Glucose-Capillary: 219 mg/dL — ABNORMAL HIGH (ref 70–99)
Glucose-Capillary: 158 mg/dL — ABNORMAL HIGH (ref 70–99)

## 2014-08-15 LAB — CULTURE, BLOOD (ROUTINE X 2)

## 2014-08-15 LAB — CARBOXYHEMOGLOBIN
CARBOXYHEMOGLOBIN: 2.3 % — AB (ref 0.5–1.5)
METHEMOGLOBIN: 0.5 % (ref 0.0–1.5)
O2 SAT: 58.1 %
Total hemoglobin: 10 g/dL — ABNORMAL LOW (ref 13.5–18.0)

## 2014-08-15 LAB — MAGNESIUM: Magnesium: 2.3 mg/dL (ref 1.5–2.5)

## 2014-08-15 LAB — PROTIME-INR
INR: 1.3 (ref 0.00–1.49)
PROTHROMBIN TIME: 16.2 s — AB (ref 11.6–15.2)

## 2014-08-15 MED ORDER — MILRINONE IN DEXTROSE 20 MG/100ML IV SOLN
0.2500 ug/kg/min | INTRAVENOUS | Status: DC
  Filled 2014-08-15: qty 100

## 2014-08-15 MED ORDER — FLUCONAZOLE IN SODIUM CHLORIDE 200-0.9 MG/100ML-% IV SOLN
200.0000 mg | INTRAVENOUS | Status: DC
  Filled 2014-08-15: qty 100

## 2014-08-15 MED ORDER — FLUCONAZOLE IN SODIUM CHLORIDE 400-0.9 MG/200ML-% IV SOLN
400.0000 mg | Freq: Once | INTRAVENOUS | Status: AC
  Administered 2014-08-15: 400 mg via INTRAVENOUS
  Filled 2014-08-15: qty 200

## 2014-08-15 NOTE — Progress Notes (Addendum)
[facility] DAILY ICU PROGRESS NOTE                   [address]            [place] [address]          [phone]   15 Days Post-Op Procedure(s) (LRB): PERMANENT PACEMAKER INSERTION (N/A)  Total Length of Stay:  LOS: 40 days   Subjective: Feels a bit better, now back on CRRT  Objective: Vital signs in last 24 hours: Temp:  [97.3 F (36.3 C)-98.6 F (37 C)] 97.7 F (36.5 C) (09/12 0820) Pulse Rate:  [64-90] 84 (09/12 0820) Cardiac Rhythm:  [-] Ventricular paced;Atrial fibrillation (09/12 0409) Resp:  [16-27] 22 (09/12 0820) BP: (94-137)/(48-84) 113/60 mmHg (09/12 0820) SpO2:  [89 %-100 %] 99 % (09/12 0820) Weight:  [185 lb 13.6 oz (84.3 kg)] 185 lb 13.6 oz (84.3 kg) (09/12 0446)  Filed Weights   08/13/14 0500 08/14/14 0255 08/15/14 0446  Weight: 196 lb 10.4 oz (89.2 kg) 197 lb 1.5 oz (89.4 kg) 185 lb 13.6 oz (84.3 kg)    Weight change: -11 lb 3.9 oz (-5.1 kg)   Hemodynamic parameters for last 24 hours:    Intake/Output from previous day: 09/11 0701 - 09/12 0700 In: 2228.1 [P.O.:650; I.V.:964.1; IV ENIDPOEUM:353] Out: 6144 [Urine:620]  Intake/Output this shift: Total I/O In: 267.6 [P.O.:220; I.V.:47.6] Out: 531 [Urine:75; Other:456]  Current Meds: Scheduled Meds: . ampicillin (OMNIPEN) IV  2 g Intravenous 3 times per day  . antiseptic oral rinse  7 mL Mouth Rinse BID  . aspirin EC  81 mg Oral Daily  . atorvastatin  10 mg Oral QPM  . cefTRIAXone (ROCEPHIN)  IV  2 g Intravenous Q12H  . darbepoetin (ARANESP) injection - NON-DIALYSIS  100 mcg Subcutaneous Q Wed-HD  . feeding supplement (ENSURE COMPLETE)  237 mL Oral BID BM  . folic acid  2 mg Oral Daily  . Influenza vac split quadrivalent PF  0.5 mL Intramuscular Tomorrow-1000  . insulin aspart  0-15 Units Subcutaneous TID WC  . insulin aspart  0-5 Units Subcutaneous QHS  . ipratropium  0.5 mg Nebulization Q4H  . micafungin (MYCAMINE) IV  100 mg Intravenous Q24H  . pantoprazole  40 mg Oral Daily   . pyridOXINE  100 mg Oral QPM  . vitamin B-12  1,000 mcg Oral QPM  . vitamin E  400 Units Oral Daily   Continuous Infusions: . amiodarone 30 mg/hr (08/15/14 0900)  . milrinone 0.25 mcg/kg/min (08/15/14 0700)  . dialysis replacement fluid (prismasate) 300 mL/hr at 08/14/14 1655  . dialysis replacement fluid (prismasate) 300 mL/hr at 08/14/14 2040  . dialysate (PRISMASATE) 1,500 mL/hr at 08/15/14 0602   PRN Meds:.acetaminophen, heparin, levalbuterol, ondansetron (ZOFRAN) IV, ondansetron (ZOFRAN) IV, ondansetron, promethazine, sodium chloride, traMADol, zolpidem  General appearance: alert, cooperative, fatigued and no distress Heart: irregularly irregular rhythm Lungs: dim in bases Abdomen: soft, nontender Extremities: + edema- improving Wound: incis healing without signs of infection   Lab Results: CBC: Recent Labs  08/14/14 0645 08/15/14 0444  WBC 5.9 4.8  HGB 9.4* 9.1*  HCT 30.3* 29.3*  PLT 65* 71*   BMET:  Recent Labs  08/14/14 0645 08/15/14 0444  NA 130* 132*  K 5.0 4.4  CL 94* 96  CO2 24 26  GLUCOSE 105* 92  BUN 37* 29*  CREATININE 2.71* 2.06*  CALCIUM 8.0* 8.0*    PT/INR:  Recent Labs  08/15/14 0444  LABPROT 16.2*  INR  1.30   Radiology: Dg Chest Port 1 View  08/15/2014   CLINICAL DATA:  Pulmonary edema  EXAM: PORTABLE CHEST - 1 VIEW  COMPARISON:  08/14/2014  FINDINGS: Left central line unchanged. Again identified is status post CABG and replacement cardiac valve. Mild cardiac enlargement. Mild to moderate vascular congestion with mild to moderate interstitial pulmonary edema. Small bilateral pleural effusions.  IMPRESSION: No significant change from prior study with mild to moderate interstitial pulmonary edema.   Electronically Signed   By: Skipper Cliche M.D.   On: 08/15/2014 07:46   Dg Chest Port 1 View  08/14/2014   CLINICAL DATA:  Temporary dialysis catheter placement  EXAM: PORTABLE CHEST - 1 VIEW  COMPARISON:  08/14/2014  FINDINGS: Cardiac shadow  is stable. Postsurgical changes in pacing device are again seen. A new dual-lumen temporary dialysis catheter is been placed. Catheter tip is noted in the proximal superior vena cava. No pneumothorax is noted. Persistent changes of vascular congestion and effusion are seen. No new focal abnormality is noted.  IMPRESSION: No evidence of pneumothorax following catheter placement.  Persistent changes of vascular congestion and effusion.   Electronically Signed   By: Inez Catalina M.D.   On: 08/14/2014 16:12   Dg Chest Port 1 View  08/14/2014   CLINICAL DATA:  Pleural effusions PORTABLE CHEST - 1 VIEW  COMPARISON:  08/13/2014.  08/12/2014.  FINDINGS: Interim removal of dialysis catheter. Cardiomegaly. Cardiac pacer lead tips in the right atrium and right ventricle. Prior CABG. Prior cardiac valve replacement. Atrial appendage clip. Bilateral pulmonary alveolar infiltrates are present. These are more prominent than on prior exam and are consistent with congestive heart failure and pulmonary edema. Bilateral pleural effusions are noted. No pneumothorax. No acute osseus abnormality.  IMPRESSION: 1. Interim removal of dialysis catheter. 2. Prior CABG. Prior cardiac valve replacement. Left atrial appendage clip. Cardiac pacer. 3. Congestive heart failure with bilateral pulmonary edema and small bilateral pleural effusions. These findings have progressed from prior exam.   Electronically Signed   By: Uriah   On: 08/14/2014 07:32   Dg Chest Portable 1 View  08/13/2014   CLINICAL DATA:  Thoracentesis.  EXAM: PORTABLE CHEST - 1 VIEW  COMPARISON:  Chest x-ray 08/12/2014.  FINDINGS: Left IJ dialysis catheter. Cardiomegaly. Prior cardiac valve replacement. Left atrial clips. Prior CABG. Pulmonary vascularity is normal. Bibasilar atelectasis and/or infiltrates. Interval reduction in left-sided pleural effusion. No pneumothorax.  IMPRESSION: 1. No evidence of pneumothorax post thoracentesis. 2. Interim improvement of  left pleural effusion. Bibasilar atelectasis and infiltrates again noted. 3. Stable cardiomegaly. Prior cardiac valve replacement. Prior CABG. Left atrial appendage clip noted. Cardiac pacer in stable position. 4. Dialysis catheter noted in stable position.   Electronically Signed   By: Marcello Moores  Register   On: 08/13/2014 15:11     Assessment/Plan: S/P Procedure(s) (LRB): PERMANENT PACEMAKER INSERTION (N/A)  1 stable- cont aggressive management as currently outlined 2 may need bilat pleurx- cont to monitor CXR/clinically   GOLD,WAYNE E 08/15/2014 10:13 AM  I have seen and examined Viviano Simas and agree with the above assessment  and plan.  Grace Isaac MD Beeper [phone] Office [phone] 08/15/2014 4:00 PM

## 2014-08-15 NOTE — Progress Notes (Addendum)
Patient ID: Justin [name], male   DOB: 1933-11-01, 80 y.o.   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 Active Problems:   Enterococcal bacteremia   CAD (coronary artery disease)   HTN (hypertension)   Atrial fibrillation   Anemia   Thrombocytopenia   Obstructive sleep apnea   Chronotropic incompetence with sinus node dysfunction   Mitral regurgitation   SOB (shortness of breath)   CHF (congestive heart failure)   Diastolic congestive heart failure   Sepsis   TIA (transient ischemic attack)   Aortic insufficiency   Chronic kidney disease   S/P aortic valve replacement with stentless valve   S/P mitral valve replacement with bioprosthetic valve   S/P CABG x 1   S/P Maze operation for atrial fibrillation   Acute renal failure superimposed on stage 3 chronic kidney disease   Acute on chronic diastolic heart failure   Fungemia   . ampicillin (OMNIPEN) IV  2 g Intravenous 3 times per day  . antiseptic oral rinse  7 mL Mouth Rinse BID  . aspirin EC  81 mg Oral Daily  . atorvastatin  10 mg Oral QPM  . cefTRIAXone (ROCEPHIN)  IV  2 g Intravenous Q12H  . darbepoetin (ARANESP) injection - NON-DIALYSIS  100 mcg Subcutaneous Q Wed-HD  . feeding supplement (ENSURE COMPLETE)  237 mL Oral BID BM  . folic acid  2 mg Oral Daily  . Influenza vac split quadrivalent PF  0.5 mL Intramuscular Tomorrow-1000  . insulin aspart  0-15 Units Subcutaneous TID WC  . insulin aspart  0-5 Units Subcutaneous QHS  . ipratropium  0.5 mg Nebulization Q4H  . micafungin (MYCAMINE) IV  100 mg Intravenous Q24H  . pantoprazole  40 mg Oral Daily  . pyridOXINE  100 mg Oral QPM  . vitamin B-12  1,000 mcg Oral QPM  . vitamin E  400 Units Oral Daily     Objective: Temp:  [97.3 F (36.3 C)-98.1 F (36.7 C)] 97.5 F (36.4 C) (09/12 1235) Pulse Rate:  [64-104] 104 (09/12 1235) Resp:  [16-27] 22 (09/12 1235) BP: (94-137)/(49-84) 123/59 mmHg (09/12 1235) SpO2:  [8 %-100 %] 8 % (09/12 1359) Weight:  [185 lb 13.6 oz (84.3 kg)] 185 lb 13.6 oz (84.3 kg) (09/12 0446)  General: He is alert, in no distress and sitting up in a chair  Lab Results Lab Results  Component Value Date   WBC 4.8 08/15/2014   HGB 9.1* 08/15/2014   HCT 29.3* 08/15/2014   MCV 102.8* 08/15/2014   PLT 71* 08/15/2014    Lab Results  Component Value Date   CREATININE 2.06* 08/15/2014   BUN 29* 08/15/2014   NA 132* 08/15/2014   K 4.4 08/15/2014   CL 96 08/15/2014   CO2 26 08/15/2014      Microbiology: Recent Results (from the past 240 hour(s))  CULTURE, BLOOD (ROUTINE X 2)     Status: None   Collection Time    08/11/14  8:35 AM      Result Value Ref Range Status   Specimen Description BLOOD RIGHT HAND   Final   Special Requests BOTTLES DRAWN AEROBIC ONLY 4CC  Final   Culture  Setup Time     Final   Value: 08/11/2014 14:03     Performed at [facility]   Culture     Final   Value:        BLOOD CULTURE RECEIVED NO GROWTH TO DATE CULTURE WILL BE HELD FOR 5 DAYS BEFORE ISSUING A FINAL NEGATIVE REPORT     Performed at [facility]   Report Status PENDING   Incomplete  CULTURE, BLOOD (ROUTINE X 2)     Status: None   Collection Time    08/11/14  8:45 AM      Result Value Ref Range Status   Specimen Description BLOOD RIGHT HAND   Final   Special Requests BOTTLES DRAWN AEROBIC ONLY [facility]   Final   Culture  Setup Time     Final   Value: 08/11/2014 14:03     Performed at [facility]   Culture     Final   Value: CANDIDA PARAPSILOSIS     Note: Gram Stain Report Called to,Read Back By and Verified With: CRYSTAL RICE 08/13/14 1120 BY SMITHERSJ     Performed at [facility]   Report Status 08/15/2014 FINAL   Final  URINE CULTURE      Status: None   Collection Time    08/11/14 11:44 AM      Result Value Ref Range Status   Specimen Description URINE, CATHETERIZED   Final   Special Requests Immunocompromised   Final   Culture  Setup Time     Final   Value: 08/11/2014 17:21     Performed at [facility]     Final   Value: 65,000 COLONIES/ML     Performed at [facility]   Culture     Final   Value: YEAST     Performed at [facility]   Report Status 08/12/2014 FINAL   Final  AFB CULTURE WITH SMEAR     Status: None   Collection Time    08/12/14  4:33 PM      Result Value Ref Range Status   Specimen Description FLUID RIGHT PLEURAL   Final   Special Requests Normal   Final   Acid Fast Smear     Final   Value: NO ACID FAST BACILLI SEEN     Performed at [facility]   Culture     Final   Value: CULTURE WILL BE EXAMINED FOR 6 WEEKS BEFORE ISSUING A FINAL REPORT     Performed at [facility]   Report Status PENDING   Incomplete  BODY FLUID CULTURE     Status: None   Collection Time    08/12/14  4:42 PM      Result Value Ref Range Status   Specimen Description FLUID RIGHT PLEURAL   Final   Special Requests Normal   Final   Gram Stain     Final   Value: RARE WBC PRESENT,BOTH PMN AND MONONUCLEAR     NO ORGANISMS SEEN     Performed at [facility]   Culture     Final   Value: NO GROWTH 1 DAY     Performed at [facility]   Report Status PENDING   Incomplete  FUNGUS CULTURE W SMEAR     Status: None   Collection Time    08/12/14  4:52 PM      Result Value Ref  Range Status   Specimen Description FLUID RIGHT PLEURAL   Final   Special Requests NONE   Final   Fungal Smear     Final   Value: NO YEAST OR FUNGAL ELEMENTS SEEN     Performed at [facility]   Culture     Final   Value: CULTURE IN PROGRESS FOR FOUR WEEKS     Performed at [facility]   Report Status PENDING   Incomplete  BODY FLUID CULTURE     Status: None    Collection Time    08/13/14  3:00 PM      Result Value Ref Range Status   Specimen Description PLEURAL FLUID LEFT   Final   Special Requests NONE   Final   Gram Stain     Final   Value: NO WBC SEEN     NO ORGANISMS SEEN     Performed at [facility]   Culture     Final   Value: NO GROWTH 1 DAY     Performed at [facility]   Report Status PENDING   Incomplete  CULTURE, EXPECTORATED SPUTUM-ASSESSMENT     Status: None   Collection Time    08/13/14  6:02 PM      Result Value Ref Range Status   Specimen Description SPUTUM   Final   Special Requests Immunocompromised   Final   Sputum evaluation     Final   Value: MICROSCOPIC FINDINGS SUGGEST THAT THIS SPECIMEN IS NOT REPRESENTATIVE OF LOWER RESPIRATORY SECRETIONS. PLEASE RECOLLECT.     Loletha Carrow RN [id number] 08/13/14 A BROWNING   Report Status 08/13/2014 FINAL   Final  CULTURE, BLOOD (ROUTINE X 2)     Status: None   Collection Time    08/14/14 12:21 PM      Result Value Ref Range Status   Specimen Description BLOOD RIGHT FOREARM   Final   Special Requests BOTTLES DRAWN AEROBIC AND ANAEROBIC 10CC   Final   Culture  Setup Time     Final   Value: 08/14/2014 15:48     Performed at [facility]   Culture     Final   Value:        BLOOD CULTURE RECEIVED NO GROWTH TO DATE CULTURE WILL BE HELD FOR 5 DAYS BEFORE ISSUING A FINAL NEGATIVE REPORT     Performed at [facility]   Report Status PENDING   Incomplete  CULTURE, BLOOD (ROUTINE X 2)     Status: None   Collection Time    08/14/14  3:42 PM      Result Value Ref Range Status   Specimen Description BLOOD HEMODIALYSIS LINE   Final   Special Requests BOTTLES DRAWN AEROBIC AND ANAEROBIC 10CC   Final   Culture  Setup Time     Final   Value: 08/14/2014 18:57     Performed at [facility]   Culture     Final   Value:        BLOOD CULTURE RECEIVED NO GROWTH TO DATE CULTURE WILL BE HELD FOR 5 DAYS BEFORE ISSUING A FINAL NEGATIVE REPORT      Performed at [facility]   Report Status PENDING   Incomplete   Assessment: He had recent, transient Candida parapsilosis fungemia. The species of Candida is almost always susceptible to azole antifungals. I will change him to renally adjusted fluconazole. Repeat blood cultures done yesterday are negative to date.  Plan: 1. Continue ampicillin and ceftriaxone 2. Change micafungin to fluconazole  Michel Bickers, MD [facility] for [place] [phone] pager   [phone] cell 08/15/2014, 2:22 PM

## 2014-08-15 NOTE — Progress Notes (Signed)
Subjective:   Volume status up. Yesterday HD cath and TLC removed due to positive cultures. L Thoracentesis.-- Yielded 1.3 liters . ID consulted. Started on amiodarone drip with uncontrolled A fib. + Blood cultures--->Yeast  Urine Cultures---> yeast. On Micafungin.   On ampicillic /cetriaxone for enterrococcus  Back on CVVHD. Edema and breathing much better. Nearing baseline weight.   Echo 9/11: EF 35-40%. No obvious vegetation  Objective:   Scheduled Meds: . ampicillin (OMNIPEN) IV  2 g Intravenous 3 times per day  . antiseptic oral rinse  7 mL Mouth Rinse BID  . aspirin EC  81 mg Oral Daily  . atorvastatin  10 mg Oral QPM  . cefTRIAXone (ROCEPHIN)  IV  2 g Intravenous Q12H  . darbepoetin (ARANESP) injection - NON-DIALYSIS  100 mcg Subcutaneous Q Wed-HD  . feeding supplement (ENSURE COMPLETE)  237 mL Oral BID BM  . folic acid  2 mg Oral Daily  . Influenza vac split quadrivalent PF  0.5 mL Intramuscular Tomorrow-1000  . insulin aspart  0-15 Units Subcutaneous TID WC  . insulin aspart  0-5 Units Subcutaneous QHS  . ipratropium  0.5 mg Nebulization Q4H  . micafungin (MYCAMINE) IV  100 mg Intravenous Q24H  . pantoprazole  40 mg Oral Daily  . pyridOXINE  100 mg Oral QPM  . vitamin B-12  1,000 mcg Oral QPM  . vitamin E  400 Units Oral Daily   Continuous Infusions: . amiodarone 30 mg/hr (08/15/14 0900)  . milrinone 0.25 mcg/kg/min (08/15/14 0700)  . dialysis replacement fluid (prismasate) 300 mL/hr at 08/14/14 1655  . dialysis replacement fluid (prismasate) 300 mL/hr at 08/14/14 2040  . dialysate (PRISMASATE) 1,500 mL/hr at 08/15/14 0602   PRN Meds:.acetaminophen, heparin, levalbuterol, ondansetron (ZOFRAN) IV, ondansetron (ZOFRAN) IV, ondansetron, promethazine, sodium chloride, traMADol, zolpidem  Filed Vitals:   08/15/14 0600 08/15/14 0700 08/15/14 0800 08/15/14 0820  BP: 112/55 113/75 113/60 113/60  Pulse:    84  Temp:    97.7 F (36.5 C)  TempSrc:    Oral  Resp: 24  19 18 22   Height:      Weight:      SpO2: 98% 98% 97% 99%    Intake/Output from previous day:  Intake/Output Summary (Last 24 hours) at 08/15/14 1012 Last data filed at 08/15/14 0900  Gross per 24 hour  Intake 2304.3 ml  Output   4747 ml  Net -2442.7 ml    Physical Exam: Physical exam:  Comfortable Skin is warm and dry.  HEENT is normal.  Neck is supple. JVP 7-8  LIJ trialysis cath Chest clear. Pacemaker site with no hematoma Cardiovascular exam is regular rate and rhythm. 2/6 systolic murmur Abdominal exam nontender or distended. No masses palpated. Extremities show . no edema. LEs wrapped neuro grossly intact    Lab Results: Basic Metabolic Panel:  Recent Labs  08/13/14 0410 08/13/14 1600 08/14/14 0645 08/15/14 0444  NA 134* 135* 130* 132*  K 4.4 5.0 5.0 4.4  CL 96 97 94* 96  CO2 27 25 24 26   GLUCOSE 96 118* 105* 92  BUN 37* 32* 37* 29*  CREATININE 2.03* 2.26* 2.71* 2.06*  CALCIUM 8.0* 7.9* 8.0* 8.0*  MG 2.2  --   --  2.3  PHOS 2.6 2.3  --   --    CBC:  Recent Labs  08/14/14 0645 08/15/14 0444  WBC 5.9 4.8  NEUTROABS 4.5 3.5  HGB 9.4* 9.1*  HCT 30.3* 29.3*  MCV 101.7* 102.8*  PLT 65* 71*     Assessment/Plan:  1. Status post aortic root replacement, bioprosthetic aortic valve replacement, bioprosthetic mitral valve replacement, coronary artery bypass graft (to LCx system) and maze-Continue rehab. 2. Acute on chronic kidney failure-nephrology following. Significantly volume overloaded, getting CVVH. 3. Thrombocytopenia-improved. Suspect ITP.  Hematology following. 4. Paroxysmal atrial fibrillation-patient is in AV paced rhythm today. Continue amiodarone drip for now. Coumadin held for thrombocytopenia. 5. CAD-continue aspirin and statin. Had SVG-LCx system with recent cardiac surgery.  6. Status post pacemaker for CHB 7. Enterococcal endocarditis-continue antibiotics;  8. Acute diastolic CHF with prominent RV failure.  He is on milrinone gtt for  RV support.  EF 50-55%-> 35-40%.  9. Acute respiratory failure 10. Fungemia/funguria 11. Pleural effusions: s/p thoracentesis on right.   PLAN/DISCUSSION   Improved with CVVHD. On micafungin for fungemia/funguria. Agree with Dr. Roxy Manns that he likely is not good TEE candidate currently and probably wouldn't change our management at this point as he is not a re-operative candidate. Will get TTE. Continue abx and antifungals.   He is almost down to baseline weight with CVVHD. Urine output poor. Renal following.   Remains on milrinone. Will check co-ox and wean if possible.    Justin Lindenberger,MD 10:12 AM

## 2014-08-15 NOTE — Progress Notes (Signed)
Subjective:  Events noted- seems clearer today mentally- back on CRRT removing 200 per hour Objective Vital signs in last 24 hours: Filed Vitals:   08/15/14 0446 08/15/14 0500 08/15/14 0530 08/15/14 0600  BP:  119/78 122/62 112/55  Pulse:      Temp:      TempSrc:      Resp:  20 27 24   Height:      Weight: 84.3 kg (185 lb 13.6 oz)     SpO2:  98% 100% 98%   Weight change: -5.1 kg (-11 lb 3.9 oz)  Intake/Output Summary (Last 24 hours) at 08/15/14 1025 Last data filed at 08/15/14 0700  Gross per 24 hour  Intake 2204.3 ml  Output   4216 ml  Net -2011.7 ml    Assessment/ Plan: Pt is Justin [name] 80 y.o. yo male who was admitted on 07/22/2014 with Justin [name] on CRF  s/p complicated cardiac surgery with complications - now fungemia Assessment/Plan: 1. Renal- UOP variable- so CRRT was started on 9/8. Brief holiday when lines needed to be changed but now back on- tolerating well. Will increase UF as able.  Baseline creatinine was in the mid ones but has undergone some significant insults- will see what time brings 2. HTN/vol- massive volume overload- had continued diuretics as was making urine and during holiday  but now will stop-  3. Anemia- gave aranesp- s/p transfusion- stable 4. Hypokalemia-  4 K bath 5. Hyponatremia- due to volume overload- improving slowly 6. Cardiac- on milrinone/dopamine- hemodynamics are improved- per cards 7. Thrombocytopenia- improved on IVIG 8. ID- original enterococcus veg- now fungemia- ID on board  Justin Justin [name]       Labs: Basic Metabolic Panel:  Recent Labs Lab 08/12/14 1600 08/13/14 0410 08/13/14 1600 08/14/14 0645 08/15/14 0444  NA 135* 134* 135* 130* 132*  K 4.0 4.4 5.0 5.0 4.4  CL 94* 96 97 94* 96  CO2 28 27 25 24 26   GLUCOSE 147* 96 118* 105* 92  BUN 42* 37* 32* 37* 29*  CREATININE 1.97* 2.03* 2.26* 2.71* 2.06*  CALCIUM 8.2* 8.0* 7.9* 8.0* 8.0*  PHOS 3.1 2.6 2.3  --   --    Liver Function Tests:  Recent Labs Lab 08/10/14 0507   08/12/14 1600 08/13/14 0410 08/13/14 1600 08/15/14 0444  AST 54*  --   --   --   --  57*  ALT 25  --   --   --   --  25  ALKPHOS 136*  --   --   --   --  117  BILITOT 0.3  --   --   --   --  0.5  PROT 5.8*  --  7.4  --   --  8.3  ALBUMIN 2.0*  < > 2.2* 2.2* 2.1* 2.0*  < > = values in this interval not displayed. No results found for this basename: LIPASE, AMYLASE,  in the last 168 hours No results found for this basename: AMMONIA,  in the last 168 hours CBC:  Recent Labs Lab 08/11/14 0845 08/12/14 0410 08/13/14 0410 08/14/14 0645 08/15/14 0444  WBC 7.4 6.7 6.3 5.9 4.8  NEUTROABS  --   --   --  4.5 3.5  HGB 10.4* 10.0* 9.4* 9.4* 9.1*  HCT 31.4* 31.0* 30.0* 30.3* 29.3*  MCV 97.2 97.8 100.0 101.7* 102.8*  PLT 29* 41* 64* 65* 71*   Cardiac Enzymes: No results found for this basename: CKTOTAL, CKMB, CKMBINDEX, TROPONINI,  in the last 168 hours  CBG:  Recent Labs Lab 08/13/14 2122 08/14/14 0727 08/14/14 1213 08/14/14 1615 08/14/14 2209  GLUCAP 134* 106* 134* 119* 194*    Iron Studies:  No results found for this basename: IRON, TIBC, TRANSFERRIN, FERRITIN,  in the last 72 hours Studies/Results: Dg Chest Port 1 View  08/15/2014   CLINICAL DATA:  Pulmonary edema  EXAM: PORTABLE CHEST - 1 VIEW  COMPARISON:  08/14/2014  FINDINGS: Left central line unchanged. Again identified is status post CABG and replacement cardiac valve. Mild cardiac enlargement. Mild to moderate vascular congestion with mild to moderate interstitial pulmonary edema. Small bilateral pleural effusions.  IMPRESSION: No significant change from prior study with mild to moderate interstitial pulmonary edema.   Electronically Signed   By: Skipper Cliche M.D.   On: 08/15/2014 07:46   Dg Chest Port 1 View  08/14/2014   CLINICAL DATA:  Temporary dialysis catheter placement  EXAM: PORTABLE CHEST - 1 VIEW  COMPARISON:  08/14/2014  FINDINGS: Cardiac shadow is stable. Postsurgical changes in pacing device are again  seen. Justin [name] new dual-lumen temporary dialysis catheter is been placed. Catheter tip is noted in the proximal superior vena cava. No pneumothorax is noted. Persistent changes of vascular congestion and effusion are seen. No new focal abnormality is noted.  IMPRESSION: No evidence of pneumothorax following catheter placement.  Persistent changes of vascular congestion and effusion.   Electronically Signed   By: Inez Catalina M.D.   On: 08/14/2014 16:12   Dg Chest Port 1 View  08/14/2014   CLINICAL DATA:  Pleural effusions PORTABLE CHEST - 1 VIEW  COMPARISON:  08/13/2014.  08/12/2014.  FINDINGS: Interim removal of dialysis catheter. Cardiomegaly. Cardiac pacer lead tips in the right atrium and right ventricle. Prior CABG. Prior cardiac valve replacement. Atrial appendage clip. Bilateral pulmonary alveolar infiltrates are present. These are more prominent than on prior exam and are consistent with congestive heart failure and pulmonary edema. Bilateral pleural effusions are noted. No pneumothorax. No acute osseus abnormality.  IMPRESSION: 1. Interim removal of dialysis catheter. 2. Prior CABG. Prior cardiac valve replacement. Left atrial appendage clip. Cardiac pacer. 3. Congestive heart failure with bilateral pulmonary edema and small bilateral pleural effusions. These findings have progressed from prior exam.   Electronically Signed   By: [place]   On: 08/14/2014 07:32   Dg Chest Portable 1 View  08/13/2014   CLINICAL DATA:  Thoracentesis.  EXAM: PORTABLE CHEST - 1 VIEW  COMPARISON:  Chest x-ray 08/12/2014.  FINDINGS: Left IJ dialysis catheter. Cardiomegaly. Prior cardiac valve replacement. Left atrial clips. Prior CABG. Pulmonary vascularity is normal. Bibasilar atelectasis and/or infiltrates. Interval reduction in left-sided pleural effusion. No pneumothorax.  IMPRESSION: 1. No evidence of pneumothorax post thoracentesis. 2. Interim improvement of left pleural effusion. Bibasilar atelectasis and  infiltrates again noted. 3. Stable cardiomegaly. Prior cardiac valve replacement. Prior CABG. Left atrial appendage clip noted. Cardiac pacer in stable position. 4. Dialysis catheter noted in stable position.   Electronically Signed   By: Marcello Moores  Register   On: 08/13/2014 15:11   Medications: Infusions: . amiodarone 30 mg/hr (08/15/14 0400)  . milrinone 0.25 mcg/kg/min (08/15/14 0400)  . dialysis replacement fluid (prismasate) 300 mL/hr at 08/14/14 1655  . dialysis replacement fluid (prismasate) 300 mL/hr at 08/14/14 2040  . dialysate (PRISMASATE) 1,500 mL/hr at 08/15/14 0602    Scheduled Medications: . ampicillin (OMNIPEN) IV  2 g Intravenous 3 times per day  . antiseptic oral rinse  7 mL Mouth Rinse BID  . aspirin  EC  81 mg Oral Daily  . atorvastatin  10 mg Oral QPM  . cefTRIAXone (ROCEPHIN)  IV  2 g Intravenous Q12H  . darbepoetin (ARANESP) injection - NON-DIALYSIS  100 mcg Subcutaneous Q Wed-HD  . feeding supplement (ENSURE COMPLETE)  237 mL Oral BID BM  . folic acid  2 mg Oral Daily  . furosemide  160 mg Intravenous Q6H  . Influenza vac split quadrivalent PF  0.5 mL Intramuscular Tomorrow-1000  . insulin aspart  0-15 Units Subcutaneous TID WC  . insulin aspart  0-5 Units Subcutaneous QHS  . ipratropium  0.5 mg Nebulization Q4H  . micafungin (MYCAMINE) IV  100 mg Intravenous Q24H  . pantoprazole  40 mg Oral Daily  . pyridOXINE  100 mg Oral QPM  . vitamin B-12  1,000 mcg Oral QPM  . vitamin E  400 Units Oral Daily    have reviewed scheduled and prn medications.  Physical Exam: General: alert, NAD Heart: RRR Lungs: decreased BS at bases Abdomen: distended Extremities: pitting edema Access- new vas cath placed 9/11    08/15/2014,7:52 AM  LOS: 40 days

## 2014-08-15 NOTE — Progress Notes (Signed)
PULMONARY / CRITICAL CARE MEDICINE   Name: Justin [name] MRN: [id number] DOB: Dec 21, 1932    ADMISSION DATE:  07/14/2014 CONSULTATION DATE:  08/11/14  REFERRING MD :  Dr. Haroldine Laws   CHIEF COMPLAINT:  Volume overload, respiratory failure.   INITIAL PRESENTATION: 80 y/o M initially admitted 8/3 with dyspnea, fever.  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  Had CABG with AVR/MVR.  Developed acute respiratory failure and renal failure with hypervolemia.  PCCM consulted to assist with management of respiratory failure.  STUDIES:  8/03  CTA Chest >> no PE, bilateral effusions, extensive GGO, nodular opacities (largest 19mm) 8/04  ECHO >> EF 50-55%, Grade 2 DD, mod AI, mod-severe MR/TR, severe pulmonary HTN PA peak 65 8/06  CT ABD/Pelvis >> no source for bacteremia, anasarca, mod-large pleural effusions 8/06  CT Head >> negative for acute abnormality 8/11  TTE >> nml LV fxn, severe MR, large aortic valve vegetation w mod AI, no pacemaker vegetation identified 8/12  HIT >> neg  9/02  ECHO >> EF 50-55%, no wma, bioprosthesis of AV/MV, mod TR, PA peak 44  SIGNIFICANT EVENTS: 8/03  Admit for sepsis 8/11  Removal of Medtronic dual-chamber pacemaker secondary to enterococcal endoaortitis  8/14  LHC >> heavily calcified ostial circumflex with 70% stenosis, otherwise no critical stenosis 8/21  AVR, Aortic Root replacement, MVR, CABG x1, MAZE procedure per Dr. Roxy Manns 8/22  Extubation 8/28  Implantation of Medtronic dual-chamber pacemaker for symptomatic bradycardia / CHB 9/08  Worsening respiratory status, desaturations, tx to ICU, CVVHD started 9/9 improved after thoracentesis and neg balance on cvvhd 9/10- all lies removed, pos yeast in  Blood 9/11- new HD cath  Subjective:back on cvvhd  VITAL SIGNS: Temp:  [97.3 F (36.3 C)-98.1 F (36.7 C)] 97.5 F (36.4 C) (09/12 1235) Pulse Rate:  [64-104] 104 (09/12 1235) Resp:  [16-27] 22 (09/12 1235) BP: (94-137)/(49-84) 123/59  mmHg (09/12 1235) SpO2:  [89 %-100 %] 98 % (09/12 1235) Weight:  [84.3 kg (185 lb 13.6 oz)] 84.3 kg (185 lb 13.6 oz) (09/12 0446)  HEMODYNAMICS:    VENTILATOR SETTINGS:   INTAKE / OUTPUT:  Intake/Output Summary (Last 24 hours) at 08/15/14 1303 Last data filed at 08/15/14 1200  Gross per 24 hour  Intake 2078.3 ml  Output   5018 ml  Net -2939.7 ml    PHYSICAL EXAMINATION: General: wob wnl Neuro:  Alert, normal strength, CN intact HEENT:  jvd lower Cardiovascular:  s1 s2 Irregular, 2/6 SM unchanged in charachter Lungs: coarse about the same Abdomen:  Soft, non tender, + bowel sounds Musculoskeletal:  2+ edema, reduced substantially Skin:  No rashes  LABS:  CBC  Recent Labs Lab 08/13/14 0410 08/14/14 0645 08/15/14 0444  WBC 6.3 5.9 4.8  HGB 9.4* 9.4* 9.1*  HCT 30.0* 30.3* 29.3*  PLT 64* 65* 71*   Coag's  Recent Labs Lab 08/12/14 0410 08/13/14 0410 08/14/14 0645 08/15/14 0444  APTT 45* 46*  --   --   INR 1.50* 1.42 1.35 1.30   BMET  Recent Labs Lab 08/13/14 1600 08/14/14 0645 08/15/14 0444  NA 135* 130* 132*  K 5.0 5.0 4.4  CL 97 94* 96  CO2 25 24 26   BUN 32* 37* 29*  CREATININE 2.26* 2.71* 2.06*  GLUCOSE 118* 105* 92   Electrolytes  Recent Labs Lab 08/12/14 0410 08/12/14 1600 08/13/14 0410 08/13/14 1600 08/14/14 0645 08/15/14 0444  CALCIUM 8.2* 8.2* 8.0* 7.9* 8.0* 8.0*  MG 2.1  --  2.2  --   --  2.3  PHOS 3.7 3.1 2.6 2.3  --   --     ABG No results found for this basename: PHART, PCO2ART, PO2ART,  in the last 168 hours  Liver Enzymes  Recent Labs Lab 08/10/14 0507  08/13/14 0410 08/13/14 1600 08/15/14 0444  AST 54*  --   --   --  57*  ALT 25  --   --   --  25  ALKPHOS 136*  --   --   --  117  BILITOT 0.3  --   --   --  0.5  ALBUMIN 2.0*  < > 2.2* 2.1* 2.0*  < > = values in this interval not displayed. Cardiac Enzymes No results found for this basename: TROPONINI, PROBNP,  in the last 168 hours Glucose  Recent  Labs Lab 08/14/14 1213 08/14/14 1615 08/14/14 2209 08/15/14 0822 08/15/14 1220 08/15/14 1233  GLUCAP 134* 119* 194* 97 177* 219*    Imaging Dg Chest Port 1 View  08/14/2014   CLINICAL DATA:  Temporary dialysis catheter placement  EXAM: PORTABLE CHEST - 1 VIEW  COMPARISON:  08/14/2014  FINDINGS: Cardiac shadow is stable. Postsurgical changes in pacing device are again seen. A new dual-lumen temporary dialysis catheter is been placed. Catheter tip is noted in the proximal superior vena cava. No pneumothorax is noted. Persistent changes of vascular congestion and effusion are seen. No new focal abnormality is noted.  IMPRESSION: No evidence of pneumothorax following catheter placement.  Persistent changes of vascular congestion and effusion.   Electronically Signed   By: Inez Catalina M.D.   On: 08/14/2014 16:12   Dg Chest Port 1 View  08/14/2014   CLINICAL DATA:  Pleural effusions PORTABLE CHEST - 1 VIEW  COMPARISON:  08/13/2014.  08/12/2014.  FINDINGS: Interim removal of dialysis catheter. Cardiomegaly. Cardiac pacer lead tips in the right atrium and right ventricle. Prior CABG. Prior cardiac valve replacement. Atrial appendage clip. Bilateral pulmonary alveolar infiltrates are present. These are more prominent than on prior exam and are consistent with congestive heart failure and pulmonary edema. Bilateral pleural effusions are noted. No pneumothorax. No acute osseus abnormality.  IMPRESSION: 1. Interim removal of dialysis catheter. 2. Prior CABG. Prior cardiac valve replacement. Left atrial appendage clip. Cardiac pacer. 3. Congestive heart failure with bilateral pulmonary edema and small bilateral pleural effusions. These findings have progressed from prior exam.   Electronically Signed   By: Tse Bonito   On: 08/14/2014 07:32     ASSESSMENT / PLAN:  PULMONARY A: Acute Hypoxic Respiratory Failure - in setting of volume overload / CHF / b/l pleural effusions. Right greater left  effusions, s/p thora rt 1.350 liters 9/9, thora left 1.1 liters Hx of OSA on CPAP as outpt. P:   CXR in am for reoccurrence effusions? Neg balance goals with cvvhd remain CPAP nocturnal  IS  CARDIOVASCULAR PICC 8/28 >> 9/10 HD 9/8>>>9/10 A:  CAD s/p CABG x1 with MVR/AVR - 8/21 per Dr. Roxy Manns.  PAF - controlled, not on anticoagulation. CHB s/p Pacemaker - 8/28 per Dr. Lovena Le. P:  Continue Amiodarone, ASA, Lipitor Milrinone gtts per CHF SVC Map goal 60 No tee planned  RENAL A:   Acute Kidney failure Mild rise K with cvvhd on hold  Hyponatremia in setting of hypervolemia.  P:   Had 620 urine!!, diuretics per renal Chem in am   GASTROINTESTINAL A:   Protein Calorie Malnutrition - in setting of critical illness. P:   diet and advancement PPI for  SUP  HEMATOLOGIC A:   Anemia of critical illness. Thrombocytopenia - fungemia driven , concern for ITP contribtion P:  Plat rising, treat fungemia IVIG maintain Cbc in am  Consider sub q hep in am if plat rise further  INFECTIOUS A:   Enterococcal Endocarditis. Fungemia, r/o picc vs urine as source, valves at risk UTI, foley related, yeast as well P:   BCx2 9/8 >> UC 9/8 >>yeast 65 k  Blood 9/8 >>>yeast Pleural rt 9/9>>>  afb neg  Yeast>>> pleural 9/10 left>>> BC 9/11>>>  Ampicillin, start date 8/08>>> Rocephin, start date 8/23>>> Diflucan 9/10>>>9/10  caspofungin 9/10>>>  Has a new foley TTE today Repeat BC today - no growth thus far  ENDOCRINE A:   Hyperglycemia - in setting of critical illness + steroid administration  P:   SSI  Cortisol if drops BP  NEUROLOGIC A:   TIA 8/06 >> resolved Mild delirium P: Mobilize as able, should make an attempt with left IJ   TODAY'S SUMMARY: neg again on cvvhd, appears well, repeat bc remain neg, PT attempts   Lavon Paganini. Titus Mould, MD, [place] Pgr: [facility]

## 2014-08-15 NOTE — Plan of Care (Signed)
Problem: Phase III Progression Outcomes Goal: Activity at appropriate level-compared to baseline (UP IN CHAIR FOR HEMODIALYSIS)  Outcome: Not Progressing Patient back on CVVH, weakness noted in all four extremities. Grips weak yet present.

## 2014-08-16 LAB — CBC
HCT: 31.2 % — ABNORMAL LOW (ref 39.0–52.0)
Hemoglobin: 9.7 g/dL — ABNORMAL LOW (ref 13.0–17.0)
MCH: 31.9 pg (ref 26.0–34.0)
MCHC: 31.1 g/dL (ref 30.0–36.0)
MCV: 102.6 fL — ABNORMAL HIGH (ref 78.0–100.0)
PLATELETS: 78 10*3/uL — AB (ref 150–400)
RBC: 3.04 MIL/uL — AB (ref 4.22–5.81)
RDW: 23.1 % — ABNORMAL HIGH (ref 11.5–15.5)
WBC: 4.7 10*3/uL (ref 4.0–10.5)

## 2014-08-16 LAB — RENAL FUNCTION PANEL
ALBUMIN: 1.9 g/dL — AB (ref 3.5–5.2)
ANION GAP: 11 (ref 5–15)
Albumin: 1.9 g/dL — ABNORMAL LOW (ref 3.5–5.2)
Anion gap: 9 (ref 5–15)
BUN: 22 mg/dL (ref 6–23)
BUN: 21 mg/dL (ref 6–23)
CALCIUM: 7.8 mg/dL — AB (ref 8.4–10.5)
CHLORIDE: 95 meq/L — AB (ref 96–112)
CHLORIDE: 97 meq/L (ref 96–112)
CO2: 25 mEq/L (ref 19–32)
CO2: 27 mEq/L (ref 19–32)
Calcium: 7.9 mg/dL — ABNORMAL LOW (ref 8.4–10.5)
Creatinine, Ser: 1.68 mg/dL — ABNORMAL HIGH (ref 0.50–1.35)
Creatinine, Ser: 1.57 mg/dL — ABNORMAL HIGH (ref 0.50–1.35)
GFR calc Af Amer: 47 mL/min — ABNORMAL LOW (ref >90)
GFR, EST AFRICAN AMERICAN: 43 mL/min — AB (ref >90)
GFR, EST NON AFRICAN AMERICAN: 37 mL/min — AB (ref >90)
GFR, EST NON AFRICAN AMERICAN: 40 mL/min — AB (ref >90)
Glucose, Bld: 82 mg/dL (ref 70–99)
Glucose, Bld: 150 mg/dL — ABNORMAL HIGH (ref 70–99)
POTASSIUM: 4.9 meq/L (ref 3.7–5.3)
Phosphorus: 2.3 mg/dL (ref 2.3–4.6)
Phosphorus: 2.1 mg/dL — ABNORMAL LOW (ref 2.3–4.6)
Potassium: 4.3 mEq/L (ref 3.7–5.3)
Sodium: 131 mEq/L — ABNORMAL LOW (ref 137–147)
Sodium: 133 mEq/L — ABNORMAL LOW (ref 137–147)

## 2014-08-16 LAB — BODY FLUID CULTURE
Culture: NO GROWTH
Culture: NO GROWTH
Gram Stain: NONE SEEN
Special Requests: NORMAL

## 2014-08-16 LAB — RETICULOCYTES
RBC.: 3.04 MIL/uL — AB (ref 4.22–5.81)
RETIC COUNT ABSOLUTE: 185.4 10*3/uL (ref 19.0–186.0)
Retic Ct Pct: 6.1 % — ABNORMAL HIGH (ref 0.4–3.1)

## 2014-08-16 LAB — CARBOXYHEMOGLOBIN
Carboxyhemoglobin: 2.7 % — ABNORMAL HIGH (ref 0.5–1.5)
Methemoglobin: 0.6 % (ref 0.0–1.5)
O2 Saturation: 74.9 %
TOTAL HEMOGLOBIN: 9.8 g/dL — AB (ref 13.5–18.0)

## 2014-08-16 LAB — LACTATE DEHYDROGENASE: LDH: 614 U/L — AB (ref 94–250)

## 2014-08-16 LAB — PROTIME-INR
INR: 1.31 (ref 0.00–1.49)
Prothrombin Time: 16.3 seconds — ABNORMAL HIGH (ref 11.6–15.2)

## 2014-08-16 LAB — GLUCOSE, CAPILLARY
GLUCOSE-CAPILLARY: 89 mg/dL (ref 70–99)
GLUCOSE-CAPILLARY: 149 mg/dL — AB (ref 70–99)
Glucose-Capillary: 160 mg/dL — ABNORMAL HIGH (ref 70–99)
Glucose-Capillary: 141 mg/dL — ABNORMAL HIGH (ref 70–99)

## 2014-08-16 LAB — MAGNESIUM: Magnesium: 2.3 mg/dL (ref 1.5–2.5)

## 2014-08-16 MED ORDER — FLUCONAZOLE IN SODIUM CHLORIDE 400-0.9 MG/200ML-% IV SOLN
400.0000 mg | INTRAVENOUS | Status: DC
  Administered 2014-08-16 – 2014-08-19 (×4): 400 mg via INTRAVENOUS
  Filled 2014-08-16 (×5): qty 200

## 2014-08-16 MED ORDER — MILRINONE IN DEXTROSE 20 MG/100ML IV SOLN
0.1250 ug/kg/min | INTRAVENOUS | Status: DC
  Administered 2014-08-17: 0.125 ug/kg/min via INTRAVENOUS
  Filled 2014-08-16: qty 100

## 2014-08-16 NOTE — Progress Notes (Signed)
Subjective:  Events noted- seems clearer today mentally-  on CRRT removing 200-250  per hour, is negative 4 liters Objective Vital signs in last 24 hours: Filed Vitals:   08/16/14 0600 08/16/14 0700 08/16/14 0745 08/16/14 0800  BP: 130/68 101/60 103/65 119/82  Pulse:      Temp:   97.7 F (36.5 C)   TempSrc:   Oral   Resp: 23 19 21 19   Height:      Weight:      SpO2: 98% 98% 100% 99%   Weight change: -5 kg (-11 lb 0.4 oz)  Intake/Output Summary (Last 24 hours) at 08/16/14 2993 Last data filed at 08/16/14 0800  Gross per 24 hour  Intake 1577.9 ml  Output   5914 ml  Net -4336.1 ml    Assessment/ Plan: Pt is [name] 80 y.o. yo male who was admitted on 07/17/2014 with [name] on CRF  s/p complicated cardiac surgery with complications - now fungemia Assessment/Plan: 1. Renal- UOP variable- so CRRT was started on 9/8. Brief holiday when lines needed to be changed but now back on- tolerating well. Will increase UF as able.  Baseline creatinine was in the mid ones but has undergone some significant insults- will see what time brings.  UOP dropped off but I did stop his lasix and the CRRT is doing the job for [place].  Anticipate another 24 hours on CRRT then to reassess  2. HTN/vol- massive volume overload- improved with CRRT but still pitting edema to upper legs - but I think approaching [name] dry weight 3. Anemia- gave aranesp- s/p transfusion- stable 4. Hypokalemia-  4 K bath 5. Hyponatremia- due to volume overload- improving slowly 6. Cardiac- on milrinone/dopamine- hemodynamics are improved- per cards 7. Thrombocytopenia- improved on IVIG 8. ID- original enterococcus veg- now fungemia- ID on board  Justin [name]       Labs: Basic Metabolic Panel:  Recent Labs Lab 08/13/14 1600  08/15/14 0444 08/15/14 1654 08/16/14 0515  NA 135*  < > 132* 133* 131*  K 5.0  < > 4.4 4.7 4.3  CL 97  < > 96 97 95*  CO2 25  < > 26 27 25   GLUCOSE 118*  < > 92 68* 82  BUN 32*  < > 29* 25* 22   CREATININE 2.26*  < > 2.06* 1.87* 1.68*  CALCIUM 7.9*  < > 8.0* 8.0* 7.8*  PHOS 2.3  --   --  2.5 2.3  < > = values in this interval not displayed. Liver Function Tests:  Recent Labs Lab 08/10/14 0507  08/12/14 1600  08/15/14 0444 08/15/14 1654 08/16/14 0515  AST 54*  --   --   --  57*  --   --   ALT 25  --   --   --  25  --   --   ALKPHOS 136*  --   --   --  117  --   --   BILITOT 0.3  --   --   --  0.5  --   --   PROT 5.8*  --  7.4  --  8.3  --   --   ALBUMIN 2.0*  < > 2.2*  < > 2.0* 2.0* 1.9*  < > = values in this interval not displayed. No results found for this basename: LIPASE, AMYLASE,  in the last 168 hours No results found for this basename: AMMONIA,  in the last 168 hours CBC:  Recent Labs Lab 08/12/14  0410 08/13/14 0410 08/14/14 0645 08/15/14 0444 08/16/14 0515  WBC 6.7 6.3 5.9 4.8 4.7  NEUTROABS  --   --  4.5 3.5  --   HGB 10.0* 9.4* 9.4* 9.1* 9.7*  HCT 31.0* 30.0* 30.3* 29.3* 31.2*  MCV 97.8 100.0 101.7* 102.8* 102.6*  PLT 41* 64* 65* 71* 78*   Cardiac Enzymes: No results found for this basename: CKTOTAL, CKMB, CKMBINDEX, TROPONINI,  in the last 168 hours CBG:  Recent Labs Lab 08/15/14 0822 08/15/14 1220 08/15/14 1233 08/15/14 1758 08/15/14 2129  GLUCAP 97 177* 219* 86 158*    Iron Studies:  No results found for this basename: IRON, TIBC, TRANSFERRIN, FERRITIN,  in the last 72 hours Studies/Results: Dg Chest Port 1 View  08/15/2014   CLINICAL DATA:  Pulmonary edema  EXAM: PORTABLE CHEST - 1 VIEW  COMPARISON:  08/14/2014  FINDINGS: Left central line unchanged. Again identified is status post CABG and replacement cardiac valve. Mild cardiac enlargement. Mild to moderate vascular congestion with mild to moderate interstitial pulmonary edema. Small bilateral pleural effusions.  IMPRESSION: No significant change from prior study with mild to moderate interstitial pulmonary edema.   Electronically Signed   By: Skipper Cliche M.D.   On: 08/15/2014  07:46   Dg Chest Port 1 View  08/14/2014   CLINICAL DATA:  Temporary dialysis catheter placement  EXAM: PORTABLE CHEST - 1 VIEW  COMPARISON:  08/14/2014  FINDINGS: Cardiac shadow is stable. Postsurgical changes in pacing device are again seen. [name] new dual-lumen temporary dialysis catheter is been placed. Catheter tip is noted in the proximal superior vena cava. No pneumothorax is noted. Persistent changes of vascular congestion and effusion are seen. No new focal abnormality is noted.  IMPRESSION: No evidence of pneumothorax following catheter placement.  Persistent changes of vascular congestion and effusion.   Electronically Signed   By: Inez Catalina M.D.   On: 08/14/2014 16:12   Medications: Infusions: . amiodarone 30 mg/hr (08/16/14 0700)  . milrinone 0.25 mcg/kg/min (08/16/14 0700)  . dialysis replacement fluid (prismasate) 300 mL/hr at 08/15/14 2203  . dialysis replacement fluid (prismasate) 300 mL/hr at 08/15/14 2203  . dialysate (PRISMASATE) 1,500 mL/hr at 08/16/14 0500    Scheduled Medications: . ampicillin (OMNIPEN) IV  2 g Intravenous 3 times per day  . antiseptic oral rinse  7 mL Mouth Rinse BID  . aspirin EC  81 mg Oral Daily  . atorvastatin  10 mg Oral QPM  . cefTRIAXone (ROCEPHIN)  IV  2 g Intravenous Q12H  . darbepoetin (ARANESP) injection - NON-DIALYSIS  100 mcg Subcutaneous Q Wed-HD  . feeding supplement (ENSURE COMPLETE)  237 mL Oral BID BM  . fluconazole (DIFLUCAN) IV  200 mg Intravenous Q24H  . folic acid  2 mg Oral Daily  . insulin aspart  0-15 Units Subcutaneous TID WC  . insulin aspart  0-5 Units Subcutaneous QHS  . ipratropium  0.5 mg Nebulization Q4H  . pantoprazole  40 mg Oral Daily  . pyridOXINE  100 mg Oral QPM  . vitamin B-12  1,000 mcg Oral QPM  . vitamin E  400 Units Oral Daily    have reviewed scheduled and prn medications.  Physical Exam: General: alert, NAD Heart: RRR Lungs: decreased BS at bases Abdomen: distended Extremities: pitting edema to  dependent areas Access- new vas cath placed 9/11    08/16/2014,8:29 AM  LOS: 41 days

## 2014-08-16 NOTE — Progress Notes (Addendum)
[facility] DAILY ICU PROGRESS NOTE                   [address]            [place] [address]          [phone]   16 Days Post-Op Procedure(s) (LRB): PERMANENT PACEMAKER INSERTION (N/A)  Total Length of Stay:  LOS: 41 days   Subjective: On CRRT, edema/fluid balance is significantly improved  Objective: Vital signs in last 24 hours: Temp:  [97.5 F (36.4 C)-98 F (36.7 C)] 97.7 F (36.5 C) (09/13 0745) Pulse Rate:  [84-104] 85 (09/13 0300) Cardiac Rhythm:  [-] Sinus tachycardia;Ventricular paced;A-V Sequential paced (09/13 0745) Resp:  [16-27] 19 (09/13 0800) BP: (101-148)/(49-89) 119/82 mmHg (09/13 0800) SpO2:  [8 %-100 %] 99 % (09/13 0800) Weight:  [174 lb 13.2 oz (79.3 kg)] 174 lb 13.2 oz (79.3 kg) (09/13 0500)  Filed Weights   08/14/14 0255 08/15/14 0446 08/16/14 0500  Weight: 197 lb 1.5 oz (89.4 kg) 185 lb 13.6 oz (84.3 kg) 174 lb 13.2 oz (79.3 kg)    Weight change: -11 lb 0.4 oz (-5 kg)   Hemodynamic parameters for last 24 hours:    Intake/Output from previous day: 09/12 0701 - 09/13 0700 In: 1758.7 [P.O.:740; I.V.:568.7; IV Piggyback:450] Out: 6433 [Urine:228]  Intake/Output this shift: Total I/O In: 23 [I.V.:23] Out: 1242 [Other:1242]  Current Meds: Scheduled Meds: . ampicillin (OMNIPEN) IV  2 g Intravenous 3 times per day  . antiseptic oral rinse  7 mL Mouth Rinse BID  . aspirin EC  81 mg Oral Daily  . atorvastatin  10 mg Oral QPM  . cefTRIAXone (ROCEPHIN)  IV  2 g Intravenous Q12H  . darbepoetin (ARANESP) injection - NON-DIALYSIS  100 mcg Subcutaneous Q Wed-HD  . feeding supplement (ENSURE COMPLETE)  237 mL Oral BID BM  . fluconazole (DIFLUCAN) IV  400 mg Intravenous Q24H  . folic acid  2 mg Oral Daily  . insulin aspart  0-15 Units Subcutaneous TID WC  . insulin aspart  0-5 Units Subcutaneous QHS  . ipratropium  0.5 mg Nebulization Q4H  . pantoprazole  40 mg Oral Daily  . pyridOXINE  100 mg Oral QPM  . vitamin B-12  1,000 mcg  Oral QPM  . vitamin E  400 Units Oral Daily   Continuous Infusions: . amiodarone 30 mg/hr (08/16/14 0845)  . milrinone    . dialysis replacement fluid (prismasate) 300 mL/hr at 08/15/14 2203  . dialysis replacement fluid (prismasate) 300 mL/hr at 08/15/14 2203  . dialysate (PRISMASATE) 1,500 mL/hr at 08/16/14 0930   PRN Meds:.acetaminophen, levalbuterol, ondansetron (ZOFRAN) IV, ondansetron (ZOFRAN) IV, ondansetron, promethazine, sodium chloride, traMADol, zolpidem  General appearance: no distress Heart: regular rate and rhythm Lungs: dim in lower fields Abdomen: soft, non-tender Extremities: no LE edema Wound: incis healing well  Lab Results: CBC: Recent Labs  08/15/14 0444 08/16/14 0515  WBC 4.8 4.7  HGB 9.1* 9.7*  HCT 29.3* 31.2*  PLT 71* 78*   BMET:  Recent Labs  08/15/14 1654 08/16/14 0515  NA 133* 131*  K 4.7 4.3  CL 97 95*  CO2 27 25  GLUCOSE 68* 82  BUN 25* 22  CREATININE 1.87* 1.68*  CALCIUM 8.0* 7.8*    PT/INR:  Recent Labs  08/16/14 0515  LABPROT 16.3*  INR 1.31   Radiology: Dg Chest Port 1 View  08/15/2014   CLINICAL DATA:  Pulmonary edema  EXAM: PORTABLE CHEST -  1 VIEW  COMPARISON:  08/14/2014  FINDINGS: Left central line unchanged. Again identified is status post CABG and replacement cardiac valve. Mild cardiac enlargement. Mild to moderate vascular congestion with mild to moderate interstitial pulmonary edema. Small bilateral pleural effusions.  IMPRESSION: No significant change from prior study with mild to moderate interstitial pulmonary edema.   Electronically Signed   By: Skipper Cliche M.D.   On: 08/15/2014 07:46   Dg Chest Port 1 View  08/14/2014   CLINICAL DATA:  Temporary dialysis catheter placement  EXAM: PORTABLE CHEST - 1 VIEW  COMPARISON:  08/14/2014  FINDINGS: Cardiac shadow is stable. Postsurgical changes in pacing device are again seen. A new dual-lumen temporary dialysis catheter is been placed. Catheter tip is noted in the  proximal superior vena cava. No pneumothorax is noted. Persistent changes of vascular congestion and effusion are seen. No new focal abnormality is noted.  IMPRESSION: No evidence of pneumothorax following catheter placement.  Persistent changes of vascular congestion and effusion.   Electronically Signed   By: Inez Catalina M.D.   On: 08/14/2014 16:12     Assessment/Plan: S/P Procedure(s) (LRB): PERMANENT PACEMAKER INSERTION (N/A)   1 steady overall improvement in volume status- nephrol managing CRRT 2 platelets slowly improving on IVIG 3 creat improving but not much urine 4 cont current abx/antifungals 5 CHF team managing RV failure GOLD,WAYNE E 08/16/2014 10:30 AM  Feels better getting fluid off, less edema in arms still in legs feet I have seen and examined Viviano Simas and agree with the above assessment  and plan.  Grace Isaac MD Beeper [phone] Office [phone] 08/16/2014 2:31 PM

## 2014-08-16 NOTE — Progress Notes (Signed)
Subjective:    Remains on CVVHD. Weight now down below baseline  On ampicillic /cetriaxone for enterrococcus  Micafungin changed to fluconazole due to Candida parapsilosis fungemia/funguira  Edema and breathing much better. Nearing baseline weight.   Echo 9/11: EF 35-40%. No obvious vegetation  Objective:   Scheduled Meds: . ampicillin (OMNIPEN) IV  2 g Intravenous 3 times per day  . antiseptic oral rinse  7 mL Mouth Rinse BID  . aspirin EC  81 mg Oral Daily  . atorvastatin  10 mg Oral QPM  . cefTRIAXone (ROCEPHIN)  IV  2 g Intravenous Q12H  . darbepoetin (ARANESP) injection - NON-DIALYSIS  100 mcg Subcutaneous Q Wed-HD  . feeding supplement (ENSURE COMPLETE)  237 mL Oral BID BM  . fluconazole (DIFLUCAN) IV  400 mg Intravenous Q24H  . folic acid  2 mg Oral Daily  . insulin aspart  0-15 Units Subcutaneous TID WC  . insulin aspart  0-5 Units Subcutaneous QHS  . ipratropium  0.5 mg Nebulization Q4H  . pantoprazole  40 mg Oral Daily  . pyridOXINE  100 mg Oral QPM  . vitamin B-12  1,000 mcg Oral QPM  . vitamin E  400 Units Oral Daily   Continuous Infusions: . amiodarone 30 mg/hr (08/16/14 0845)  . milrinone 0.25 mcg/kg/min (08/16/14 0700)  . dialysis replacement fluid (prismasate) 300 mL/hr at 08/15/14 2203  . dialysis replacement fluid (prismasate) 300 mL/hr at 08/15/14 2203  . dialysate (PRISMASATE) 1,500 mL/hr at 08/16/14 0500   PRN Meds:.acetaminophen, levalbuterol, ondansetron (ZOFRAN) IV, ondansetron (ZOFRAN) IV, ondansetron, promethazine, sodium chloride, traMADol, zolpidem  Filed Vitals:   08/16/14 0600 08/16/14 0700 08/16/14 0745 08/16/14 0800  BP: 130/68 101/60 103/65 119/82  Pulse:      Temp:   97.7 F (36.5 C)   TempSrc:   Oral   Resp: 23 19 21 19   Height:      Weight:      SpO2: 98% 98% 100% 99%    Intake/Output from previous day:  Intake/Output Summary (Last 24 hours) at 08/16/14 0959 Last data filed at 08/16/14 0900  Gross per 24 hour    Intake 1554.1 ml  Output   6085 ml  Net -4530.9 ml    Physical Exam: Physical exam:  Comfortable Skin is warm and dry.  HEENT is normal.  Neck is supple. JVP 7-8  LIJ trialysis cath Chest clear. Pacemaker site with no hematoma Cardiovascular exam is regular rate and rhythm. 2/6 systolic murmur Abdominal exam nontender or distended. No masses palpated. Extremities show . no edema. LEs wrapped neuro grossly intact    Lab Results: Basic Metabolic Panel:  Recent Labs  08/15/14 0444 08/15/14 1654 08/16/14 0515  NA 132* 133* 131*  K 4.4 4.7 4.3  CL 96 97 95*  CO2 26 27 25   GLUCOSE 92 68* 82  BUN 29* 25* 22  CREATININE 2.06* 1.87* 1.68*  CALCIUM 8.0* 8.0* 7.8*  MG 2.3  --  2.3  PHOS  --  2.5 2.3   CBC:  Recent Labs  08/14/14 0645 08/15/14 0444 08/16/14 0515  WBC 5.9 4.8 4.7  NEUTROABS 4.5 3.5  --   HGB 9.4* 9.1* 9.7*  HCT 30.3* 29.3* 31.2*  MCV 101.7* 102.8* 102.6*  PLT 65* 71* 78*     Assessment/Plan:  1. Status post aortic root replacement, bioprosthetic aortic valve replacement, bioprosthetic mitral valve replacement, coronary artery bypass graft (to LCx system) and maze-Continue rehab. 2. Acute on chronic kidney failure-nephrology following. Significantly  volume overloaded, getting CVVH. 3. Thrombocytopenia-improved. Suspect ITP.  Hematology following. 4. Paroxysmal atrial fibrillation-patient is in AV paced rhythm today. Continue amiodarone drip for now. Coumadin held for thrombocytopenia. 5. CAD-continue aspirin and statin. Had SVG-LCx system with recent cardiac surgery.  6. Status post pacemaker for CHB 7. Enterococcal endocarditis-continue antibiotics;  8. Acute diastolic CHF with prominent RV failure.  He is on milrinone gtt for RV support.  EF 50-55%-> 35-40%.  9. Acute respiratory failure 10. Fungemia/funguria 11. Pleural effusions: s/p thoracentesis on right.   PLAN/DISCUSSION   Improved with CVVHD. Likely one more day per Renal. U/o remains  poor.    Agree with Dr. Roxy Manns that he likely is not good TEE candidate currently and probably wouldn't change our management at this point as he is not a re-operative candidate. No obvious vegetations on TTE. Continue abx and antifungals.   Remains on milrinone. Co-ox good. Will begin wean.   Platelets trenning up slowly. No bleeding   Benay Spice 9:59 AM

## 2014-08-16 NOTE — Progress Notes (Signed)
Filter clotted; unable to return blood; New filter set set up completed per protocol and CRRT restarted at 1620; pt resting quietly c no current voiced complaints; will continue to monitor

## 2014-08-16 NOTE — Progress Notes (Signed)
PULMONARY / CRITICAL CARE MEDICINE   Name: Justin [name] MRN: [id number] DOB: 11-Oct-1933    ADMISSION DATE:  07/15/2014 CONSULTATION DATE:  08/11/14  REFERRING MD :  Dr. Haroldine Laws   CHIEF COMPLAINT:  Volume overload, respiratory failure.   INITIAL PRESENTATION: 80 y/o M initially admitted 8/3 with dyspnea, fever.  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  Had CABG with AVR/MVR.  Developed acute respiratory failure and renal failure with hypervolemia.  PCCM consulted to assist with management of respiratory failure.  STUDIES:  8/03  CTA Chest >> no PE, bilateral effusions, extensive GGO, nodular opacities (largest 27mm) 8/04  ECHO >> EF 50-55%, Grade 2 DD, mod AI, mod-severe MR/TR, severe pulmonary HTN PA peak 65 8/06  CT ABD/Pelvis >> no source for bacteremia, anasarca, mod-large pleural effusions 8/06  CT Head >> negative for acute abnormality 8/11  TTE >> nml LV fxn, severe MR, large aortic valve vegetation w mod AI, no pacemaker vegetation identified 8/12  HIT >> neg  9/02  ECHO >> EF 50-55%, no wma, bioprosthesis of AV/MV, mod TR, PA peak 44  9/11 echo>>> EF 35-40%, mod reduced systolic function, PA press 68mmHg, worsened akinesis apex , no obvious veg   SIGNIFICANT EVENTS: 8/03  Admit for sepsis 8/11  Removal of Medtronic dual-chamber pacemaker secondary to enterococcal endoaortitis  8/14  LHC >> heavily calcified ostial circumflex with 70% stenosis, otherwise no critical stenosis 8/21  AVR, Aortic Root replacement, MVR, CABG x1, MAZE procedure per Dr. Roxy Manns 8/22  Extubation 8/28  Implantation of Medtronic dual-chamber pacemaker for symptomatic bradycardia / CHB 9/08  Worsening respiratory status, desaturations, tx to ICU, CVVHD started 9/9 improved after thoracentesis and neg balance on cvvhd 9/10- all lines removed, pos yeast in  Blood 9/11- new HD cath  Subjective:  Feels good.  Remains on CVVH. Neg balance awesome  VITAL SIGNS: Temp:  [97.7 F (36.5  C)-98 F (36.7 C)] 98 F (36.7 C) (09/13 1100) Pulse Rate:  [84-85] 85 (09/13 0300) Resp:  [16-30] 30 (09/13 1100) BP: (101-148)/(49-89) 101/75 mmHg (09/13 1100) SpO2:  [8 %-100 %] 100 % (09/13 1100) Weight:  [174 lb 13.2 oz (79.3 kg)] 174 lb 13.2 oz (79.3 kg) (09/13 0500)  HEMODYNAMICS:    VENTILATOR SETTINGS:   INTAKE / OUTPUT:  Intake/Output Summary (Last 24 hours) at 08/16/14 1307 Last data filed at 08/16/14 1200  Gross per 24 hour  Intake 1197.9 ml  Output   6580 ml  Net -5382.1 ml    PHYSICAL EXAMINATION: General: wob wnl Neuro:  Alert, normal strength, CN intact HEENT:  jvd lower daily Cardiovascular:  s1 s2 Irregular, 2/6 SM  Lungs: resps even, non labored, scattered rhonchi R>L,  Abdomen:  Soft, non tender, + bowel sounds Musculoskeletal:  1+ edema, reduced substantially Skin:  No rashes  LABS:  CBC  Recent Labs Lab 08/14/14 0645 08/15/14 0444 08/16/14 0515  WBC 5.9 4.8 4.7  HGB 9.4* 9.1* 9.7*  HCT 30.3* 29.3* 31.2*  PLT 65* 71* 78*   Coag's  Recent Labs Lab 08/12/14 0410 08/13/14 0410 08/14/14 0645 08/15/14 0444 08/16/14 0515  APTT 45* 46*  --   --   --   INR 1.50* 1.42 1.35 1.30 1.31   BMET  Recent Labs Lab 08/15/14 0444 08/15/14 1654 08/16/14 0515  NA 132* 133* 131*  K 4.4 4.7 4.3  CL 96 97 95*  CO2 26 27 25   BUN 29* 25* 22  CREATININE 2.06* 1.87* 1.68*  GLUCOSE 92  68* 82   Electrolytes  Recent Labs Lab 08/13/14 0410 08/13/14 1600  08/15/14 0444 08/15/14 1654 08/16/14 0515  CALCIUM 8.0* 7.9*  < > 8.0* 8.0* 7.8*  MG 2.2  --   --  2.3  --  2.3  PHOS 2.6 2.3  --   --  2.5 2.3  < > = values in this interval not displayed.  ABG No results found for this basename: PHART, PCO2ART, PO2ART,  in the last 168 hours  Liver Enzymes  Recent Labs Lab 08/10/14 0507  08/15/14 0444 08/15/14 1654 08/16/14 0515  AST 54*  --  57*  --   --   ALT 25  --  25  --   --   ALKPHOS 136*  --  117  --   --   BILITOT 0.3  --  0.5  --    --   ALBUMIN 2.0*  < > 2.0* 2.0* 1.9*  < > = values in this interval not displayed. Cardiac Enzymes No results found for this basename: TROPONINI, PROBNP,  in the last 168 hours Glucose  Recent Labs Lab 08/15/14 1220 08/15/14 1233 08/15/14 1758 08/15/14 2129 08/16/14 0748 08/16/14 1123  GLUCAP 177* 219* 86 158* 89 160*    Imaging Dg Chest Port 1 View  08/15/2014   CLINICAL DATA:  Pulmonary edema  EXAM: PORTABLE CHEST - 1 VIEW  COMPARISON:  08/14/2014  FINDINGS: Left central line unchanged. Again identified is status post CABG and replacement cardiac valve. Mild cardiac enlargement. Mild to moderate vascular congestion with mild to moderate interstitial pulmonary edema. Small bilateral pleural effusions.  IMPRESSION: No significant change from prior study with mild to moderate interstitial pulmonary edema.   Electronically Signed   By: Skipper Cliche M.D.   On: 08/15/2014 07:46     ASSESSMENT / PLAN:  PULMONARY A: Acute Hypoxic Respiratory Failure - in setting of volume overload / CHF / b/l pleural effusions. Right greater left effusions, s/p thora rt 1.350 liters 9/9, thora left 1.1 liters Hx of OSA on CPAP as outpt. P:   CXR in am eval re accumulation effusions Continue neg balance goals with cvvhd, plan seems 24 hr more CPAP nocturnal  Aggressive pulm hygiene   CARDIOVASCULAR PICC 8/28 >> 9/10 HD 9/8>>>9/10 A:  CAD s/p CABG x1 with MVR/AVR - 8/21 per Dr. Roxy Manns.  PAF - controlled, not on anticoagulation. CHB s/p Pacemaker - 8/28 per Dr. Lovena Le. P:  Continue Amiodarone, ASA, Lipitor Milrinone gtts per CHF SVC - can this be weaned off? Map goal 60 No tee planned - not re-operative candidate, no obvious veg on TTE  RENAL A:   Acute Kidney failure Mild rise K with cvvhd on hold  Hyponatremia in setting of hypervolemia.  P:   Cont CVVHD per renal - likely stop 9/14 then consider restart lasix high dose has responded in past F/u chem   GASTROINTESTINAL A:    Protein Calorie Malnutrition - in setting of critical illness. P:   diet and advancement PPI for SUP  HEMATOLOGIC A:   Anemia of critical illness. Thrombocytopenia - fungemia driven , concern for ITP contribtion P:  Plat rising, treat fungemia Cbc in am  Cont hold off on sq heparin - monitor plt   INFECTIOUS A:   Enterococcal Endocarditis. Fungemia, r/o picc vs urine as source, valves at risk UTI, foley related, yeast as well P:   UC 9/8 >>yeast 65 k  Blood 9/8 >>>yeast>>>candida parapsilosis Pleural rt 9/9>>>  afb neg  Yeast>>>parapsilosis, sens diflucan pleural 9/10 left>>> BC 9/11>>>  Ampicillin, start date 8/08>>> Rocephin, start date 8/23>>> Diflucan 9/10>>>9/10 >>>9/12>>> caspofungin 9/10>>>9/12  F/u culture data   ENDOCRINE A:   Hyperglycemia - in setting of critical illness + steroid administration  P:   SSI   NEUROLOGIC A:   TIA 8/06 >> resolved Mild delirium P: Mobilize as able  TODAY'S SUMMARY: Cont to improve overall.  Likely 24h more CVVH.  Repeat BC pending.  Cont abx.  Mobilize.    Nickolas Madrid, NP 08/16/2014  1:07 PM Pager: [phone] or [phone]  *Care during the described time interval was provided by me and/or other providers on the critical care team. I have reviewed this patient's available data, including medical history, events of note, physical examination and test results as part of my evaluation.    Lavon Paganini. Titus Mould, MD, [place] Pgr: [facility]

## 2014-08-17 ENCOUNTER — Inpatient Hospital Stay (HOSPITAL_COMMUNITY): Payer: Medicare Other

## 2014-08-17 LAB — RENAL FUNCTION PANEL
ALBUMIN: 2 g/dL — AB (ref 3.5–5.2)
ANION GAP: 11 (ref 5–15)
Albumin: 2 g/dL — ABNORMAL LOW (ref 3.5–5.2)
Anion gap: 10 (ref 5–15)
BUN: 19 mg/dL (ref 6–23)
BUN: 29 mg/dL — ABNORMAL HIGH (ref 6–23)
CALCIUM: 8.2 mg/dL — AB (ref 8.4–10.5)
CHLORIDE: 98 meq/L (ref 96–112)
CO2: 26 meq/L (ref 19–32)
CO2: 25 mEq/L (ref 19–32)
Calcium: 8 mg/dL — ABNORMAL LOW (ref 8.4–10.5)
Chloride: 95 mEq/L — ABNORMAL LOW (ref 96–112)
Creatinine, Ser: 1.72 mg/dL — ABNORMAL HIGH (ref 0.50–1.35)
Creatinine, Ser: 2.39 mg/dL — ABNORMAL HIGH (ref 0.50–1.35)
GFR calc Af Amer: 42 mL/min — ABNORMAL LOW (ref >90)
GFR calc Af Amer: 28 mL/min — ABNORMAL LOW (ref >90)
GFR calc non Af Amer: 36 mL/min — ABNORMAL LOW (ref >90)
GFR calc non Af Amer: 24 mL/min — ABNORMAL LOW (ref >90)
Glucose, Bld: 96 mg/dL (ref 70–99)
Glucose, Bld: 120 mg/dL — ABNORMAL HIGH (ref 70–99)
PHOSPHORUS: 2.9 mg/dL (ref 2.3–4.6)
POTASSIUM: 5.1 meq/L (ref 3.7–5.3)
Phosphorus: 2.6 mg/dL (ref 2.3–4.6)
Potassium: 4.6 mEq/L (ref 3.7–5.3)
SODIUM: 134 meq/L — AB (ref 137–147)
Sodium: 131 mEq/L — ABNORMAL LOW (ref 137–147)

## 2014-08-17 LAB — CBC
HEMATOCRIT: 34.4 % — AB (ref 39.0–52.0)
Hemoglobin: 10.7 g/dL — ABNORMAL LOW (ref 13.0–17.0)
MCH: 31.4 pg (ref 26.0–34.0)
MCHC: 31.1 g/dL (ref 30.0–36.0)
MCV: 100.9 fL — ABNORMAL HIGH (ref 78.0–100.0)
Platelets: 87 10*3/uL — ABNORMAL LOW (ref 150–400)
RBC: 3.41 MIL/uL — ABNORMAL LOW (ref 4.22–5.81)
RDW: 22.6 % — ABNORMAL HIGH (ref 11.5–15.5)
WBC: 4.4 10*3/uL (ref 4.0–10.5)

## 2014-08-17 LAB — HEPARIN LEVEL (UNFRACTIONATED): Heparin Unfractionated: 0.31 IU/mL (ref 0.30–0.70)

## 2014-08-17 LAB — GLUCOSE, CAPILLARY
GLUCOSE-CAPILLARY: 153 mg/dL — AB (ref 70–99)
Glucose-Capillary: 93 mg/dL (ref 70–99)
Glucose-Capillary: 157 mg/dL — ABNORMAL HIGH (ref 70–99)
Glucose-Capillary: 74 mg/dL (ref 70–99)

## 2014-08-17 LAB — RETICULOCYTES
RBC.: 3.41 MIL/uL — ABNORMAL LOW (ref 4.22–5.81)
Retic Count, Absolute: 170.5 10*3/uL (ref 19.0–186.0)
Retic Ct Pct: 5 % — ABNORMAL HIGH (ref 0.4–3.1)

## 2014-08-17 LAB — MAGNESIUM: Magnesium: 2.4 mg/dL (ref 1.5–2.5)

## 2014-08-17 LAB — PROTIME-INR
INR: 1.26 (ref 0.00–1.49)
Prothrombin Time: 15.8 seconds — ABNORMAL HIGH (ref 11.6–15.2)

## 2014-08-17 LAB — CARBOXYHEMOGLOBIN
Carboxyhemoglobin: 2.5 % — ABNORMAL HIGH (ref 0.5–1.5)
METHEMOGLOBIN: 0.5 % (ref 0.0–1.5)
O2 SAT: 87.2 %
TOTAL HEMOGLOBIN: 9.9 g/dL — AB (ref 13.5–18.0)

## 2014-08-17 LAB — CULTURE, BLOOD (ROUTINE X 2): Culture: NO GROWTH

## 2014-08-17 LAB — MRSA PCR SCREENING: MRSA by PCR: NEGATIVE

## 2014-08-17 LAB — LACTATE DEHYDROGENASE: LDH: 635 U/L — AB (ref 94–250)

## 2014-08-17 MED ORDER — HEPARIN SODIUM (PORCINE) 1000 UNIT/ML DIALYSIS
1000.0000 [IU] | INTRAMUSCULAR | Status: DC | PRN
  Administered 2014-08-17: 3000 [IU] via INTRAVENOUS_CENTRAL
  Filled 2014-08-17: qty 6

## 2014-08-17 MED ORDER — HEPARIN (PORCINE) IN NACL 100-0.45 UNIT/ML-% IJ SOLN
1000.0000 [IU]/h | INTRAMUSCULAR | Status: DC
  Administered 2014-08-17 – 2014-08-19 (×3): 1000 [IU]/h via INTRAVENOUS
  Filled 2014-08-17 (×5): qty 250

## 2014-08-17 NOTE — Progress Notes (Signed)
ANTICOAGULATION CONSULT NOTE - Initial Consult  Pharmacy Consult for Heparin Indication: atrial fibrillation, recent tissue valve replacement  No Known Allergies  Patient Measurements: Height: 5\' 11"  (180.3 cm) Weight: 166 lb 0.1 oz (75.3 kg) IBW/kg (Calculated) : 75.3  Vital Signs: Temp: 98.4 F (36.9 C) (09/14 1600) Temp src: Oral (09/14 1600) BP: 126/57 mmHg (09/14 1600)  Labs:  Recent Labs  08/15/14 0444  08/16/14 0515 08/16/14 1600 08/17/14 0431 08/17/14 1500 08/17/14 1800  HGB 9.1*  --  9.7*  --  10.7*  --   --   HCT 29.3*  --  31.2*  --  34.4*  --   --   PLT 71*  --  78*  --  87*  --   --   LABPROT 16.2*  --  16.3*  --  15.8*  --   --   INR 1.30  --  1.31  --  1.26  --   --   HEPARINUNFRC  --   --   --   --   --   --  0.31  CREATININE 2.06*  < > 1.68* 1.57* 1.72* 2.39*  --   < > = values in this interval not displayed.  Estimated Creatinine Clearance: 26.7 ml/min (by C-G formula based on Cr of 2.39).   Medical History: Past Medical History  Diagnosis Date  . CAD (coronary artery disease) prior stenting 1999   . Dyslipidemia   . HTN (hypertension)   . WPW (Wolff-Parkinson-White syndrome) loss of preexcitation   . Atrial fibrillation   . AV block, Mobitz II   . Presyncope   . Myocardial infarction   . Pacemaker 04/07/2013  . Arthritis   . History of chicken pox   . Anemia   . Hypernatremia 06/03/2013  . Thrombocytopenia, unspecified 06/03/2013  . Leukopenia 06/03/2013  . Obstructive sleep apnea 06/03/2013    USES CPAP  . Urinary incontinence 06/03/2013  . Hyperglycemia 12/21/2013  . Pancytopenia 06/03/2013  . Iron deficiency anemia, unspecified 07/02/2014  . Malabsorption of iron 07/02/2014  . Hypotestosteronemia 07/02/2014  . ITP (idiopathic thrombocytopenic purpura) 07/02/2014    possible - although patient had bacterial endocarditis at the time of diagnosis  . Aortic insufficiency   . Chronic kidney disease   . Bacterial endocarditis      Enterococcus  sepsis with aortic valve vegetation  . UTI (urinary tract infection) 07/07/2014    ENTEROBACTER CLOACAE   . S/P aortic valve and mitral valve replacement 07/20/2014    21 mm Medtronic Freestyle porcine aortic root graft 29 mm [facility] Mitral bovine bioprosthetic mitral valve   . S/P aortic valve replacement with stentless valve 07/09/2014    21 mm Medtronic Freestyle porcine aortic root graft with reimplantation of left main and right coronary arteries  . S/P mitral valve replacement with bioprosthetic valve 07/11/2014    29 mm [facility] Mitral bovine bioprosthetic tissue valve  . S/P CABG x 1 07/18/2014    SVG to OM1 with EVH via right thigh  . S/P Maze operation for atrial fibrillation 07/06/2014    Complete bilateral atrial lesion set using cryothermy and bipolar radiofrequency ablation with clipping of LA appendage  . Acute renal failure superimposed on stage 3 chronic kidney disease 07/27/2014  . Acute on chronic diastolic heart failure 4/43/1540    Medications:  Prescriptions prior to admission  Medication Sig Dispense Refill  . atorvastatin (LIPITOR) 10 MG tablet Take 1 tablet (10 mg total) by mouth every evening.  90 tablet  3  . Azelaic Acid (FINACEA) 15 % cream Apply 1 application topically daily. After skin is thoroughly washed and patted dry, gently but thoroughly massage a thin film of azelaic acid creame      . Cholecalciferol (VITAMIN D-3) 1000 UNITS CAPS Take 1 capsule by mouth daily.      . furosemide (LASIX) 20 MG tablet Take 3 tablets (60 mg total) by mouth daily.  90 tablet  12  . losartan (COZAAR) 50 MG tablet Take 1 tablet (50 mg total) by mouth daily.  90 tablet  3  . Multiple Vitamin (MULTIVITAMIN WITH MINERALS) TABS Take 1 tablet by mouth 2 (two) times a week.       . nadolol (CORGARD) 20 MG tablet Take 10 mg by mouth daily.      . nitroGLYCERIN (NITROSTAT) 0.4 MG SL tablet Place 1 tablet (0.4 mg total) under the tongue every 5 (five) minutes as needed for chest  pain. x3 doses as needed for chest pain  25 tablet  12  . omeprazole (PRILOSEC) 20 MG capsule Take 20 mg by mouth daily as needed (for heartburn).       Vladimir Faster Glycol-Propyl Glycol (SYSTANE ULTRA OP) Apply 1 drop to eye 4 (four) times daily as needed (for dry eyes).      . predniSONE (DELTASONE) 20 MG tablet Take 80 mg by mouth daily with breakfast.      . Probiotic Product (PROBIOTIC DAILY PO) Take 1 capsule by mouth daily.       Marland Kitchen pyridOXINE (VITAMIN B-6) 100 MG tablet Take 50 mg by mouth every evening.       . tamsulosin (FLOMAX) 0.4 MG CAPS capsule Take 0.4 mg by mouth at bedtime.      . vitamin B-12 (CYANOCOBALAMIN) 1000 MCG tablet Take 1,000 mcg by mouth every evening.      . Vitamin E (VITA-PLUS E PO) Take 1 capsule by mouth daily.        Assessment: 80 yo M admitted 07/27/2014 [facility] 8/21: porcine aortic root/valve, Pericardial tissue MVR, CABG x 1, Maze pacer extraction. Post op sepsis with thrombocytopenia and ARF.  Pharmacy consulted to renally adjust antibiotics & resume anticoagulation for afib 9/14.  AC/Heme: Afib-no AC PTA d/t recent GIB and low pltc. Warfarin on hold since 9/5 per Dr. Marin Olp, f/u restart when he returns to town - hold for now d/t plt and eventual d/c HD cath, hep with CVVH ITP/IDA: 2 wk hx ITP/IDA w/steroids (7/31) / IVIG (8/3 per Dr Marin Olp). Dr. Marin Olp following -NPLATE (romiplostim- CSF for chronic ITP) last given 2 mcg/kg 9/7; IVIG thru 9/11. Onc started folate, off steroid. Aranesp qWed, (Fe low, Feraheme 1020mg  8/27) .   H/h stable, platelets markedly improved.  Renal: AoCKD--CVVH since 9/8, off 9/14    BP: SCDs, PPI  PM  Heparin level came back therapeutic tonight at 0.31  Goal of Therapy:  Heparin level 0.3-0.7 units/ml Monitor platelets by anticoagulation protocol: Yes Renal adjustment of antibiotics   Plan:   Cont heparin 1000 units/hr, NO Bolus F/u with heparin level in AM

## 2014-08-17 NOTE — Progress Notes (Signed)
CARE MANAGE MENT UTILIZATION REVIEW NOTE 08/17/2014     Patient:  Justin [name], Justin [name]   Account Number:  [id number]  Documented by:  Verta Ellen   Per Ur Regulation Reviewed for med. necessity/level of care/duration of stay

## 2014-08-17 NOTE — Progress Notes (Signed)
Physical Therapy Treatment Patient Details Name: Justin [name] MRN: [id number] DOB: [birth year] Today's Date: 08/17/2014    History of Present Illness Pt is a 80 y.o. male with history of CAD status post stents, hypertension, dyslipidemia, a-fib, pacemaker, ITP currently being treated with IVIG, who presents to the emergency department with complaints of fever and shortness of breath. Pt found to have bacterial endocarditis and underwent MVR, Aortic root replacement, and CABG on 07/09/2014. Pt underwent pacer placement on 8/28. Placed on CRRT 9/8-9/13.    PT Comments    Pt mobilizing surprisingly well after prolonged time in bed, ambulated with cardiac rehab earlier in day and could not tolerate second ambulation at this time but was able to practice transfers as well as LE exercise program with manual resistance. Pt requiring min/ mod A for mobility at this point with +2 for stability. PT will continue to follow.   Follow Up Recommendations  CIR     Equipment Recommendations  Rolling walker with 5" wheels    Recommendations for Other Services       Precautions / Restrictions Precautions Precautions: Sternal;Fall;ICD/Pacemaker Restrictions Weight Bearing Restrictions: No    Mobility  Bed Mobility               General bed mobility comments: pt recieved sitting up  Transfers Overall transfer level: Needs assistance Equipment used: Rolling walker (2 wheeled) Transfers: Sit to/from [employer] Transfers Sit to Stand: Mod assist;+2 physical assistance Stand pivot transfers: Min assist;+2 physical assistance       General transfer comment: pt weaker than last session after 6 days CRRT, mod A for power up with +2 for safety and stability. Min A with RW for pivot turn from [facility] to chair. PT with good mgmt of RW, vc's for hand placement  Ambulation/Gait             General Gait Details: pt ambulated with cardiac rehab earlier and reported was too fatigued to  ambulate again   Stairs            Wheelchair Mobility    Modified Rankin (Stroke Patients Only)       Balance Overall balance assessment: Needs assistance Sitting-balance support: No upper extremity supported;Feet supported Sitting balance-Leahy Scale: Fair     Standing balance support: Bilateral upper extremity supported;During functional activity Standing balance-Leahy Scale: Poor                      Cognition Arousal/Alertness: Awake/alert Behavior During Therapy: WFL for tasks assessed/performed Overall Cognitive Status: Within Functional Limits for tasks assessed                      Exercises General Exercises - Lower Extremity Ankle Circles/Pumps: AROM;Both;10 reps;Seated Long Arc Quad: AROM;Both;Seated (with manual resistance for extension and flexion) Hip ABduction/ADduction: AROM;Both;15 reps;Seated (with manual resistance) Hip Flexion/Marching: AROM;Both;10 reps;Seated    General Comments        Pertinent Vitals/Pain Pain Assessment: No/denies pain O2 sats 100% HR 102 bpm RR 24    Home Living                      Prior Function            PT Goals (current goals can now be found in the care plan section) Acute Rehab PT Goals Patient Stated Goal: To return home with wife PT Goal Formulation: With patient Time For Goal Achievement: 08/31/14 Potential to Achieve  Goals: Good Progress towards PT goals: Progressing toward goals    Frequency  Min 3X/week    PT Plan Current plan remains appropriate    Co-evaluation             End of Session Equipment Utilized During Treatment: Gait belt;Oxygen Activity Tolerance: Patient tolerated treatment well Patient left: in chair;with call bell/phone within reach;with family/visitor present     Time: 9937-1696 PT Time Calculation (min): 18 min  Charges:  $Therapeutic Activity: 8-22 mins                    G Codes:     Leighton Roach, PT  Acute Rehab  Services  [phone]  Leighton Roach 08/17/2014, 1:23 PM

## 2014-08-17 NOTE — Progress Notes (Addendum)
INFECTIOUS DISEASE PROGRESS NOTE  ID: Justin [name] is a 80 y.o. 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 Active Problems:   CAD (coronary artery disease)   HTN (hypertension)   Atrial fibrillation   Anemia   Thrombocytopenia   Obstructive sleep apnea   Chronotropic incompetence with sinus node dysfunction   Mitral regurgitation   SOB (shortness of breath)   CHF (congestive heart failure)   Diastolic congestive heart failure   Sepsis   Enterococcal bacteremia   TIA (transient ischemic attack)   Aortic insufficiency   Chronic kidney disease   S/P aortic valve replacement with stentless valve   S/P mitral valve replacement with bioprosthetic valve   S/P CABG x 1   S/P Maze operation for atrial fibrillation   Acute renal failure superimposed on stage 3 chronic kidney disease   Acute on chronic diastolic heart failure   Fungemia  Subjective: "feeling better than I have in awhile"/  No SOB  Abtx:  Anti-infectives   Start     Dose/Rate Route Frequency Ordered Stop   08/16/14 1500  fluconazole (DIFLUCAN) IVPB 200 mg  Status:  Discontinued     200 mg 100 mL/hr over 60 Minutes Intravenous Every 24 hours 08/15/14 1428 08/16/14 0857   08/16/14 1500  fluconazole (DIFLUCAN) IVPB 400 mg     400 mg 100 mL/hr over 120 Minutes Intravenous Every 24 hours 08/16/14 0857     08/15/14 1500  fluconazole (DIFLUCAN) IVPB 400 mg     400 mg 100 mL/hr over 120 Minutes Intravenous  Once 08/15/14 1428 08/15/14 1841   08/13/14 1500  micafungin (MYCAMINE) 100 mg in sodium chloride 0.9 % 100 mL IVPB  Status:  Discontinued     100 mg 100 mL/hr over 1 Hours Intravenous Every 24 hours 08/13/14 1421 08/15/14 1428   08/13/14 1430  micafungin (MYCAMINE) 100 mg in sodium chloride 0.9 % 100 mL IVPB  Status:  Discontinued     100 mg 100 mL/hr over 1 Hours Intravenous Daily 08/13/14 1421 08/13/14 1422   08/13/14 1000  fluconazole  (DIFLUCAN) IVPB 100 mg  Status:  Discontinued     100 mg 50 mL/hr over 60 Minutes Intravenous Every 24 hours 08/13/14 0856 08/13/14 1421   07/08/2014 2200  ceFAZolin (ANCEF) IVPB 1 g/50 mL premix  Status:  Discontinued     1 g 100 mL/hr over 30 Minutes Intravenous 3 times per day 07/26/2014 1904 08/01/14 1252   07/25/2014 1915  gentamicin (GARAMYCIN) 80 mg in sodium chloride irrigation 0.9 % 500 mL irrigation  Status:  Discontinued     80 mg Irrigation On call 08/01/2014 1904 07/30/2014 1913   07/08/2014 1915  ceFAZolin (ANCEF) IVPB 2 g/50 mL premix  Status:  Discontinued     2 g 100 mL/hr over 30 Minutes Intravenous On call 07/16/2014 1904 07/13/2014 1913   07/07/2014 1315  gentamicin (GARAMYCIN) 80 mg in sodium chloride irrigation 0.9 % 500 mL irrigation  Status:  Discontinued      Irrigation To Cath Lab 07/27/2014 1313 08/01/14 0955   07/06/2014 1306  ceFAZolin (ANCEF) 2-3 GM-% IVPB SOLR    Comments:  Nadal, Cindy   : cabinet override      07/12/2014 1306 08/01/2014 1306   07/27/14 1500  ampicillin (OMNIPEN) 2 g in sodium chloride 0.9 % 50 mL IVPB     2 g 150 mL/hr over 20 Minutes Intravenous 3 times per day 07/27/14  1116     07/26/14 1200  cefTRIAXone (ROCEPHIN) 2 g in dextrose 5 % 50 mL IVPB     2 g 100 mL/hr over 30 Minutes Intravenous Every 12 hours 07/26/14 1131     07/25/14 0000  cefUROXime (ZINACEF) 1.5 g in dextrose 5 % 50 mL IVPB  Status:  Discontinued     1.5 g 100 mL/hr over 30 Minutes Intravenous Every 12 hours 07/06/2014 2004 07/26/14 1131   07/28/2014 2200  vancomycin (VANCOCIN) IVPB 1000 mg/200 mL premix     1,000 mg 200 mL/hr over 60 Minutes Intravenous  Once 07/20/2014 2004 07/19/2014 2346   07/07/2014 1745  vancomycin (VANCOCIN) 1,000 mg in sodium chloride 0.9 % 1,000 mL irrigation      Irrigation To Surgery 07/10/2014 1731 07/16/2014 1802   07/11/2014 0400  vancomycin (VANCOCIN) 1,250 mg in sodium chloride 0.9 % 250 mL IVPB     1,250 mg 166.7 mL/hr over 90 Minutes Intravenous To Surgery 07/23/14 1114  07/09/2014 0740   07/10/2014 0400  cefUROXime (ZINACEF) 1.5 g in dextrose 5 % 50 mL IVPB     1.5 g 100 mL/hr over 30 Minutes Intravenous To Surgery 07/23/14 1114 07/17/2014 1509   07/18/2014 0400  cefUROXime (ZINACEF) 750 mg in dextrose 5 % 50 mL IVPB  Status:  Discontinued     750 mg 100 mL/hr over 30 Minutes Intravenous To Surgery 07/23/14 1114 07/23/2014 1944   07/31/2014 0400  vancomycin (VANCOCIN) 1,000 mg in sodium chloride 0.9 % 1,000 mL irrigation      Irrigation To Surgery 07/23/14 1921 07/16/2014 1058   07/16/14 1300  [MAR Hold]  gentamicin (GARAMYCIN) IVPB 100 mg  Status:  Discontinued     (On MAR Hold since 07/31/2014 0627)   100 mg 200 mL/hr over 30 Minutes Intravenous Every 24 hours 07/15/14 1917 08/01/2014 1944   07/12/2014 1045  gentamicin (GARAMYCIN) 80 mg in sodium chloride irrigation 0.9 % 500 mL irrigation  Status:  Discontinued     80 mg Irrigation On call 08/01/2014 1031 07/30/2014 1805   07/12/14 1300  gentamicin (GARAMYCIN) IVPB 80 mg  Status:  Discontinued     80 mg 100 mL/hr over 30 Minutes Intravenous Every 24 hours 07/12/14 1200 07/15/14 1917   07/11/14 1200  ampicillin (OMNIPEN) 2 g in sodium chloride 0.9 % 50 mL IVPB  Status:  Discontinued     2 g 150 mL/hr over 20 Minutes Intravenous 4 times per day 07/11/14 0945 07/27/14 1116   07/07/14 0030  piperacillin-tazobactam (ZOSYN) IVPB 3.375 g  Status:  Discontinued     3.375 g 12.5 mL/hr over 240 Minutes Intravenous Every 8 hours 07/27/2014 2042 07/08/14 1602   08/02/2014 2300  levofloxacin (LEVAQUIN) IVPB 750 mg  Status:  Discontinued     750 mg 100 mL/hr over 90 Minutes Intravenous Every 48 hours 08/01/2014 2232 07/08/14 1022   07/28/2014 1615  piperacillin-tazobactam (ZOSYN) IVPB 3.375 g     3.375 g 100 mL/hr over 30 Minutes Intravenous  Once 08/01/2014 1604 08/03/2014 1755   07/27/2014 1615  vancomycin (VANCOCIN) IVPB 1000 mg/200 mL premix     1,000 mg 200 mL/hr over 60 Minutes Intravenous  Once 07/21/2014 1604 07/23/14 0709   07/05/2014 0030   vancomycin (VANCOCIN) IVPB 750 mg/150 ml premix  Status:  Discontinued     750 mg 150 mL/hr over 60 Minutes Intravenous Every 12 hours 07/17/2014 2042 07/11/14 0945      Medications:  Scheduled: . ampicillin (OMNIPEN) IV  2  g Intravenous 3 times per day  . antiseptic oral rinse  7 mL Mouth Rinse BID  . aspirin EC  81 mg Oral Daily  . atorvastatin  10 mg Oral QPM  . cefTRIAXone (ROCEPHIN)  IV  2 g Intravenous Q12H  . darbepoetin (ARANESP) injection - NON-DIALYSIS  100 mcg Subcutaneous Q Wed-HD  . feeding supplement (ENSURE COMPLETE)  237 mL Oral BID BM  . fluconazole (DIFLUCAN) IV  400 mg Intravenous Q24H  . folic acid  2 mg Oral Daily  . insulin aspart  0-15 Units Subcutaneous TID WC  . insulin aspart  0-5 Units Subcutaneous QHS  . ipratropium  0.5 mg Nebulization Q4H  . pantoprazole  40 mg Oral Daily  . pyridOXINE  100 mg Oral QPM  . vitamin B-12  1,000 mcg Oral QPM  . vitamin E  400 Units Oral Daily    Objective: Vital signs in last 24 hours: Temp:  [97.6 F (36.4 C)-98.9 F (37.2 C)] 98.2 F (36.8 C) (09/14 0729) Pulse Rate:  [61-63] 63 (09/14 0400) Resp:  [16-30] 20 (09/14 0800) BP: (94-136)/(38-75) 108/38 mmHg (09/14 0800) SpO2:  [94 %-100 %] 99 % (09/14 0800) Weight:  [75.3 kg (166 lb 0.1 oz)] 75.3 kg (166 lb 0.1 oz) (09/14 0500)   General appearance: alert, cooperative and L neck HD catheter Resp: clear to auscultation bilaterally Cardio: regular rate and rhythm GI: normal findings: bowel sounds normal and soft, non-tender  Lab Results  Recent Labs  08/16/14 0515 08/16/14 1600 08/17/14 0431  WBC 4.7  --  4.4  HGB 9.7*  --  10.7*  HCT 31.2*  --  34.4*  NA 131* 133* 134*  K 4.3 4.9 4.6  CL 95* 97 98  CO2 25 27 26   BUN 22 21 19   CREATININE 1.68* 1.57* 1.72*   Liver Panel  Recent Labs  08/15/14 0444  08/16/14 1600 08/17/14 0431  PROT 8.3  --   --   --   ALBUMIN 2.0*  < > 1.9* 2.0*  AST 57*  --   --   --   ALT 25  --   --   --   ALKPHOS 117  --    --   --   BILITOT 0.5  --   --   --   < > = values in this interval not displayed. Sedimentation Rate No results found for this basename: ESRSEDRATE,  in the last 72 hours C-Reactive Protein No results found for this basename: CRP,  in the last 72 hours  Microbiology: Recent Results (from the past 240 hour(s))  CULTURE, BLOOD (ROUTINE X 2)     Status: None   Collection Time    08/11/14  8:35 AM      Result Value Ref Range Status   Specimen Description BLOOD RIGHT HAND   Final   Special Requests BOTTLES DRAWN AEROBIC ONLY 4CC   Final   Culture  Setup Time     Final   Value: 08/11/2014 14:03     Performed at [facility]   Culture     Final   Value: NO GROWTH 5 DAYS     Performed at [facility]   Report Status 08/17/2014 FINAL   Final  CULTURE, BLOOD (ROUTINE X 2)     Status: None   Collection Time    08/11/14  8:45 AM      Result Value Ref Range Status   Specimen Description BLOOD RIGHT HAND  Final   Special Requests BOTTLES DRAWN AEROBIC ONLY [name]   Final   Culture  Setup Time     Final   Value: 08/11/2014 14:03     Performed at [facility]   Culture     Final   Value: CANDIDA PARAPSILOSIS     Note: Gram Stain Report Called to,Read Back By and Verified With: CRYSTAL RICE 08/13/14 1120 BY SMITHERSJ     Performed at [facility]   Report Status 08/15/2014 FINAL   Final  URINE CULTURE     Status: None   Collection Time    08/11/14 11:44 AM      Result Value Ref Range Status   Specimen Description URINE, CATHETERIZED   Final   Special Requests Immunocompromised   Final   Culture  Setup Time     Final   Value: 08/11/2014 17:21     Performed at [place]     Final   Value: 65,000 COLONIES/ML     Performed at [facility]   Culture     Final   Value: YEAST     Performed at [facility]   Report Status 08/12/2014 FINAL   Final  AFB CULTURE WITH SMEAR     Status: None   Collection Time     08/12/14  4:33 PM      Result Value Ref Range Status   Specimen Description FLUID RIGHT PLEURAL   Final   Special Requests Normal   Final   Acid Fast Smear     Final   Value: NO ACID FAST BACILLI SEEN     Performed at [facility]   Culture     Final   Value: CULTURE WILL BE EXAMINED FOR 6 WEEKS BEFORE ISSUING A FINAL REPORT     Performed at [facility]   Report Status PENDING   Incomplete  BODY FLUID CULTURE     Status: None   Collection Time    08/12/14  4:42 PM      Result Value Ref Range Status   Specimen Description FLUID RIGHT PLEURAL   Final   Special Requests Normal   Final   Gram Stain     Final   Value: RARE WBC PRESENT,BOTH PMN AND MONONUCLEAR     NO ORGANISMS SEEN     Performed at [facility]   Culture     Final   Value: NO GROWTH 3 DAYS     Performed at [facility]   Report Status 08/16/2014 FINAL   Final  FUNGUS CULTURE W SMEAR     Status: None   Collection Time    08/12/14  4:52 PM      Result Value Ref Range Status   Specimen Description FLUID RIGHT PLEURAL   Final   Special Requests NONE   Final   Fungal Smear     Final   Value: NO YEAST OR FUNGAL ELEMENTS SEEN     Performed at [facility]   Culture     Final   Value: CULTURE IN PROGRESS FOR FOUR WEEKS     Performed at [facility]   Report Status PENDING   Incomplete  BODY FLUID CULTURE     Status: None   Collection Time    08/13/14  3:00 PM      Result Value Ref Range Status   Specimen Description PLEURAL FLUID LEFT   Final  Special Requests NONE   Final   Gram Stain     Final   Value: NO WBC SEEN     NO ORGANISMS SEEN     Performed at [facility]   Culture     Final   Value: NO GROWTH 3 DAYS     Performed at [facility]   Report Status 08/16/2014 FINAL   Final  CULTURE, EXPECTORATED SPUTUM-ASSESSMENT     Status: None   Collection Time    08/13/14  6:02 PM      Result Value Ref Range Status   Specimen Description SPUTUM    Final   Special Requests Immunocompromised   Final   Sputum evaluation     Final   Value: MICROSCOPIC FINDINGS SUGGEST THAT THIS SPECIMEN IS NOT REPRESENTATIVE OF LOWER RESPIRATORY SECRETIONS. PLEASE RECOLLECT.     Loletha Carrow RN [id number] 08/13/14 A BROWNING   Report Status 08/13/2014 FINAL   Final  CULTURE, BLOOD (ROUTINE X 2)     Status: None   Collection Time    08/14/14 12:21 PM      Result Value Ref Range Status   Specimen Description BLOOD RIGHT FOREARM   Final   Special Requests BOTTLES DRAWN AEROBIC AND ANAEROBIC 10CC   Final   Culture  Setup Time     Final   Value: 08/14/2014 15:48     Performed at [facility]   Culture     Final   Value:        BLOOD CULTURE RECEIVED NO GROWTH TO DATE CULTURE WILL BE HELD FOR 5 DAYS BEFORE ISSUING A FINAL NEGATIVE REPORT     Performed at [facility]   Report Status PENDING   Incomplete  CULTURE, BLOOD (ROUTINE X 2)     Status: None   Collection Time    08/14/14  3:42 PM      Result Value Ref Range Status   Specimen Description BLOOD HEMODIALYSIS LINE   Final   Special Requests BOTTLES DRAWN AEROBIC AND ANAEROBIC 10CC   Final   Culture  Setup Time     Final   Value: 08/14/2014 18:57     Performed at [facility]   Culture     Final   Value:        BLOOD CULTURE RECEIVED NO GROWTH TO DATE CULTURE WILL BE HELD FOR 5 DAYS BEFORE ISSUING A FINAL NEGATIVE REPORT     Performed at [facility]   Report Status PENDING   Incomplete    Studies/Results: Dg Chest Port 1 View  08/17/2014   CLINICAL DATA:  Follow-up of respiratory failure ; shortness of breath  EXAM: PORTABLE CHEST - 1 VIEW  COMPARISON:  Portable chest x-ray of August 15, 2014  FINDINGS: The lungs are adequately inflated. There are small bilateral pleural effusions greater on the right than on the left. There is no alveolar infiltrate. The cardiopericardial silhouette is top-normal in size. A valve ring is in place in the mitral position. A left  atrial appendage clip is present. The patient has undergone previous CABG. A left internal jugular venous catheter tip lies in the proximal SVC and is stable. The permanent pacemaker is unchanged in position. The observed portions of the bony thorax are unremarkable.  IMPRESSION: There has been mild interval improvement in the appearance of the pulmonary interstitium suggesting resolving pulmonary edema. There remain small bilateral pleural effusions greater on the right than on the left.  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  Plan to complete his enterococcal IE therapy as planned (9-17) TTE does not mention vegitation but TEE is deferred due to his respiratory status.          Bobby Rumpf Infectious Diseases (pager) [phone] [email or web address] 08/17/2014, 9:28 AM  LOS: 42 days

## 2014-08-17 NOTE — Progress Notes (Signed)
Subjective:   Events noted Clotted filter twice yesterday (resumed at 4:20 yesterday, clotted again at 4:30 this morning Despite this weight down another 4 kg  Objective Vital signs in last 24 hours: Filed Vitals:   08/17/14 0500 08/17/14 0600 08/17/14 0729 08/17/14 0800  BP: 118/57 114/60 119/55 108/38  Pulse:      Temp:   98.2 F (36.8 C)   TempSrc:   Oral   Resp: 16 24 23 20   Height:      Weight: 75.3 kg (166 lb 0.1 oz)     SpO2: 94% 100% 99% 99%   Weight change: -4 kg (-8 lb 13.1 oz)  Intake/Output Summary (Last 24 hours) at 08/17/14 0830 Last data filed at 08/17/14 0700  Gross per 24 hour  Intake   1905 ml  Output   6558 ml  Net  -4653 ml   Weight Trending 08/17/14 0500 75.3 kg (166 lb 0.1 oz)  08/16/14 0500 79.3 kg (174 lb 13.2 oz) 08/15/14 0446 84.3 kg (185 lb 13.6 oz) 08/14/14 0255 89.4 kg (197 lb 1.5 oz)  08/13/14 0500 89.2 kg (196 lb 10.4 oz) 08/12/14 0413 100.2 kg (220 lb 14.4 oz)  Physical Exam: General: alert, NAD. Eating breakfast.   Heart: RRR Lungs: decreased BS at bases Abdomen: non-distended, non-tender Extremities: No edema Access- new vas cath placed 9/11 in left IJ position  Labs: Basic Metabolic Panel:  Recent Labs Lab 08/16/14 0515 08/16/14 1600 08/17/14 0431  NA 131* 133* 134*  K 4.3 4.9 4.6  CL 95* 97 98  CO2 25 27 26   GLUCOSE 82 150* 96  BUN 22 21 19   CREATININE 1.68* 1.57* 1.72*  CALCIUM 7.8* 7.9* 8.2*  PHOS 2.3 2.1* 2.6     Recent Labs Lab 08/12/14 1600  08/15/14 0444  08/16/14 0515 08/16/14 1600 08/17/14 0431  AST  --   --  57*  --   --   --   --   ALT  --   --  25  --   --   --   --   ALKPHOS  --   --  117  --   --   --   --   BILITOT  --   --  0.5  --   --   --   --   PROT 7.4  --  8.3  --   --   --   --   ALBUMIN 2.2*  < > 2.0*  < > 1.9* 1.9* 2.0*   Recent Labs Lab 08/13/14 0410 08/14/14 0645 08/15/14 0444 08/16/14 0515 08/17/14 0431  WBC 6.3 5.9 4.8 4.7 4.4  NEUTROABS  --  4.5 3.5  --   --   HGB  9.4* 9.4* 9.1* 9.7* 10.7*  HCT 30.0* 30.3* 29.3* 31.2* 34.4*  MCV 100.0 101.7* 102.8* 102.6* 100.9*  PLT 64* 65* 71* 78* 87*    Recent Labs Lab 08/16/14 0748 08/16/14 1123 08/16/14 1630 08/16/14 2136 08/17/14 0732  GLUCAP 89 160* 149* 141* 93   Studies/Results: Dg Chest Port 1 View  08/17/2014   CLINICAL DATA:  Follow-up of respiratory failure ; shortness of breath  EXAM: PORTABLE CHEST - 1 VIEW  COMPARISON:  Portable chest x-ray of August 15, 2014  FINDINGS: The lungs are adequately inflated. There are small bilateral pleural effusions greater on the right than on the left. There is no alveolar infiltrate. The cardiopericardial silhouette is top-normal in size. A valve ring is in place in the mitral  position. A left atrial appendage clip is present. The patient has undergone previous CABG. A left internal jugular venous catheter tip lies in the proximal SVC and is stable. The permanent pacemaker is unchanged in position. The observed portions of the bony thorax are unremarkable.  IMPRESSION: There has been mild interval improvement in the appearance of the pulmonary interstitium suggesting resolving pulmonary edema. There remain small bilateral pleural effusions greater on the right than on the left.   Electronically Signed   By: David  [place]   On: 08/17/2014 07:16   Medications: . amiodarone 30 mg/hr (08/17/14 0636)  . milrinone 0.125 mcg/kg/min (08/17/14 0636)  . dialysis replacement fluid (prismasate) 300 mL/hr at 08/16/14 1445  . dialysis replacement fluid (prismasate) 300 mL/hr at 08/16/14 1505  . dialysate (PRISMASATE) 1,500 mL/hr at 08/16/14 1508    Scheduled Medications: . ampicillin (OMNIPEN) IV  2 g Intravenous 3 times per day  . antiseptic oral rinse  7 mL Mouth Rinse BID  . aspirin EC  81 mg Oral Daily  . atorvastatin  10 mg Oral QPM  . cefTRIAXone (ROCEPHIN)  IV  2 g Intravenous Q12H  . darbepoetin (ARANESP) injection - NON-DIALYSIS  100 mcg Subcutaneous Q Wed-HD   . feeding supplement (ENSURE COMPLETE)  237 mL Oral BID BM  . fluconazole (DIFLUCAN) IV  400 mg Intravenous Q24H  . folic acid  2 mg Oral Daily  . insulin aspart  0-15 Units Subcutaneous TID WC  . insulin aspart  0-5 Units Subcutaneous QHS  . ipratropium  0.5 mg Nebulization Q4H  . pantoprazole  40 mg Oral Daily  . pyridOXINE  100 mg Oral QPM  . vitamin B-12  1,000 mcg Oral QPM  . vitamin E  400 Units Oral Daily   Assessment Pt is a 80 y.o. yo male who was admitted on 07/07/2014 with A on CRF (baseline around 1.5) s/p complicated cardiac surgery with complications - now fungemia Plan Renal- UOP variable- so CRRT was started on 9/8. Brief holiday when lines needed to be changed then was restarted and has tolerated well but clotted filter X 2 yesterday.  Labs are fine. Has made only about 150 cc of urine past 24 hours however.  I think we could hold for 24 hours and see if any increase in urine outputdoing the job for [place].  He may be able to transition to intermittent HD (if we do have to return to CRRT) Dr. Roxy Manns has approved the use of heparin) OK to D/C foley HTN/vol- massive volume overload responded well to CRRT and he is below his preop weight now Anemia- gave aranesp- s/p transfusion- stable Cardiac- on milrinone/dopamine- hemodynamics are improved- per cards Thrombocytopenia- improved on IVIG ID- original enterococcus veg- now fungemia- ID on board  Jamal Maes, MD [place] Pager 08/17/2014, 8:53 AM

## 2014-08-17 NOTE — Progress Notes (Signed)
At 0435- filter alarmed clotting filter. Patient was returned 152 ml of blood and Normal saline from CRRT without difficulty.  Dr. Jimmy Footman notified and questioned on continuing CRRT. Directed to call renal physician for clarification. Dr.Schertz paged and notified at 57. Verbal orders received to place CRRT on hold until physicians rounded in morning. CRRT was removed and heparin 1.4 ml placed on each cannula . Patient reports no complaints of distress or pain at present time.

## 2014-08-17 NOTE — Progress Notes (Signed)
PULMONARY / CRITICAL CARE MEDICINE   Name: Justin [name] MRN: [id number] DOB: [birth year]    ADMISSION DATE:  07/13/2014 CONSULTATION DATE:  08/11/14  REFERRING MD :  Dr. Haroldine Laws   CHIEF COMPLAINT:  Volume overload, respiratory failure.   INITIAL PRESENTATION: 80 y/o M initially admitted 8/3 with dyspnea, fever.  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  Had CABG with AVR/MVR.  Developed acute respiratory failure and renal failure with hypervolemia.  PCCM consulted to assist with management of respiratory failure.  STUDIES:  8/03  CTA Chest >> no PE, bilateral effusions, extensive GGO, nodular opacities (largest 64mm) 8/04  ECHO >> EF 50-55%, Grade 2 DD, mod AI, mod-severe MR/TR, severe pulmonary HTN PA peak 65 8/06  CT ABD/Pelvis >> no source for bacteremia, anasarca, mod-large pleural effusions 8/06  CT Head >> negative for acute abnormality 8/11  TTE >> nml LV fxn, severe MR, large aortic valve vegetation w mod AI, no pacemaker vegetation identified 8/12  HIT >> neg  9/02  ECHO >> EF 50-55%, no wma, bioprosthesis of AV/MV, mod TR, PA peak 44  9/11 echo>>> EF 35-40%, mod reduced systolic function, PA press 37mmHg, worsened akinesis apex , no obvious veg   SIGNIFICANT EVENTS: 8/03  Admit for sepsis 8/11  Removal of Medtronic dual-chamber pacemaker secondary to enterococcal endoaortitis  8/14  LHC >> heavily calcified ostial circumflex with 70% stenosis, otherwise no critical stenosis 8/21  AVR, Aortic Root replacement, MVR, CABG x1, MAZE procedure per Dr. Roxy Manns 8/22  Extubation 8/28  Implantation of Medtronic dual-chamber pacemaker for symptomatic bradycardia / CHB 9/08  Worsening respiratory status, desaturations, tx to ICU, CVVHD started 9/9 improved after thoracentesis and neg balance on cvvhd 9/10- all lines removed, pos yeast in  Blood 9/11- new HD cath 9/12-14 CVVHD until filter clotted 9/14 AM  Subjective:  CVVHD filter clotted this morning, feels better  than he has "in a long time"  VITAL SIGNS: Temp:  [97.6 F (36.4 C)-98.9 F (37.2 C)] 98.2 F (36.8 C) (09/14 0729) Pulse Rate:  [61-63] 63 (09/14 0400) Resp:  [16-30] 19 (09/14 1000) BP: (94-136)/(38-75) 131/61 mmHg (09/14 1000) SpO2:  [94 %-100 %] 100 % (09/14 1000) Weight:  [75.3 kg (166 lb 0.1 oz)] 75.3 kg (166 lb 0.1 oz) (09/14 0500)  HEMODYNAMICS:    VENTILATOR SETTINGS:   INTAKE / OUTPUT:  Intake/Output Summary (Last 24 hours) at 08/17/14 1056 Last data filed at 08/17/14 1000  Gross per 24 hour  Intake 2140.6 ml  Output   5587 ml  Net -3446.4 ml    PHYSICAL EXAMINATION:  Gen: sitting up in bed, no acute distress HEENT: NCAT, EOMi, PULM: few crackles in bases, otherwise clear CV: Irreg irreg, systolic murmur AB: BS+, soft, nontender Ext: warm, no leg edema, SCDs, no clubbing, no cyanosis Derm: no rash or skin breakdown Neuro: A&Ox4, MAEW  LABS:  CBC  Recent Labs Lab 08/15/14 0444 08/16/14 0515 08/17/14 0431  WBC 4.8 4.7 4.4  HGB 9.1* 9.7* 10.7*  HCT 29.3* 31.2* 34.4*  PLT 71* 78* 87*   Coag's  Recent Labs Lab 08/12/14 0410 08/13/14 0410  08/15/14 0444 08/16/14 0515 08/17/14 0431  APTT 45* 46*  --   --   --   --   INR 1.50* 1.42  < > 1.30 1.31 1.26  < > = values in this interval not displayed. BMET  Recent Labs Lab 08/16/14 0515 08/16/14 1600 08/17/14 0431  NA 131* 133* 134*  K 4.3 4.9  4.6  CL 95* 97 98  CO2 25 27 26   BUN 22 21 19   CREATININE 1.68* 1.57* 1.72*  GLUCOSE 82 150* 96   Electrolytes  Recent Labs Lab 08/15/14 0444  08/16/14 0515 08/16/14 1600 08/17/14 0431  CALCIUM 8.0*  < > 7.8* 7.9* 8.2*  MG 2.3  --  2.3  --  2.4  PHOS  --   < > 2.3 2.1* 2.6  < > = values in this interval not displayed.  ABG No results found for this basename: PHART, PCO2ART, PO2ART,  in the last 168 hours  Liver Enzymes  Recent Labs Lab 08/15/14 0444  08/16/14 0515 08/16/14 1600 08/17/14 0431  AST 57*  --   --   --   --   ALT  25  --   --   --   --   ALKPHOS 117  --   --   --   --   BILITOT 0.5  --   --   --   --   ALBUMIN 2.0*  < > 1.9* 1.9* 2.0*  < > = values in this interval not displayed. Cardiac Enzymes No results found for this basename: TROPONINI, PROBNP,  in the last 168 hours Glucose  Recent Labs Lab 08/15/14 2129 08/16/14 0748 08/16/14 1123 08/16/14 1630 08/16/14 2136 08/17/14 0732  GLUCAP 158* 89 160* 149* 141* 93    Imaging 9/14 CXR > improved interstitial edema, persistent small effusions R>L   ASSESSMENT / PLAN:  PULMONARY A: Acute Hypoxic Respiratory Failure - in setting of volume overload / CHF / b/l pleural effusions > resolved Bilateral pleural effusions > both tapped, felt to be due to volume overload Hx of OSA on CPAP as outpt. P:   Monitor CXR for effusions Goal slightly net neg to even CPAP nocturnal  Out of bed  CARDIOVASCULAR PICC 8/28 >> 9/10 HD 9/8>>>9/10 HD L IJ 9/11 >> A:  CAD s/p CABG x1 with MVR/AVR - 8/21 per Dr. Roxy Manns.  PAF - controlled, not on anticoagulation. CHB s/p Pacemaker - 8/28 per Dr. Lovena Le. P:  Amiodarone, ASA, Lipitor per cardiology Milrinone gtts per CHF SVC  Map goal 60 No tee planned - not re-operative candidate, no obvious veg on TTE  RENAL A:   Acute Kidney failure Mild Hyponatremia  P:   per renal - IHD tomorrow? F/u chem   GASTROINTESTINAL A:   Protein Calorie Malnutrition - in setting of critical illness. P:   diet and advancement PPI for SUP  HEMATOLOGIC A:   Anemia of critical illness. Thrombocytopenia - fungemia driven , ITP? (unlikely, No antiplatelet Ab sent) P:  Treat multiple medical problems Cbc daily Cont hold off on sq heparin - monitor plt  SCD for DVT propylaxis  INFECTIOUS A:   Enterococcal Endocarditis. Fungemia, r/o picc vs urine as source, valves at risk UTI, foley related, yeast as well P:   UC 9/8 >>yeast 65 k  Blood 9/8 >>>yeast>>>candida parapsilosis Pleural rt 9/9>>>neg  afb  neg pleural 9/10 left>>>neg BC 9/11>>>  Ampicillin, start date 8/08>>> Rocephin, start date 8/23>>> Diflucan 9/10>>>9/10 >>>9/12>>> caspofungin 9/10>>>9/12  F/u culture data  Per ID  ENDOCRINE A:   Hyperglycemia - in setting of critical illness + steroid administration  P:   SSI   NEUROLOGIC A:   TIA 8/06 >> resolved Mild delirium > resolved P: Mobilize as able  TODAY'S SUMMARY: Overall improving, now out of bed, walking around.  Likely move to step down 9/15 if remains stable  Brent McQuaid, MD [facility] Pager: [phone] Cell: [phone] If no response, call [phone]  

## 2014-08-17 NOTE — Progress Notes (Signed)
We continue to recommend an inpt rehab consult if pt and his wife would like to pursue an admission before eventual return to ILF at [facility]. Noted Filter clotted for CRRT and Nephrology plans. [phone]

## 2014-08-17 NOTE — Progress Notes (Signed)
NUTRITION FOLLOW UP  DOCUMENTATION CODES Per approved criteria  -Not Applicable   INTERVENTION:  Recommend liberalize diet to Regular until eating better  Continue Ensure Complete po BID, each supplement provides 350 kcal and 13 grams of protein  NUTRITION DIAGNOSIS: Increased nutrient needs related to post-op healing as evidenced by estimated nutrition needs, ongoing  Goal: Pt to meet >/= 90% of their estimated nutrition needs, not meeting  Monitor:  PO & supplemental intake, weight, labs, I/O's  ASSESSMENT: 80 y.o. Male with history of CAD status post stents, HTN, dyslipidemia, atrial fibrillation no longer on anticoagulation secondary to recent GI bleed, pacemaker, ITP currently being treated with IVIG by Dr. Marin Olp (most recently 8/3) and prednisone 80mg  daily, who presented to the emergency department with complaints of fever and shortness of breath that started 8/2. In ED found to have leukocytosis, hypotension, and lactic acidosis.    Patient s/p procedures 8/21: AORTIC ROOT REPLACEMENT MITRAL VALVE REPLACEMENT CORONARY BYPASS GRAFTING MAZE PROCEDURE  Pt started CVVHD 9/8 due to fluid overload. CVVHD stopped 9/14 am due to clotting and has not yet resumed. Per MD he may be able to transition to IHD.  Labs reviewed: Na & Potassium low High BUN & Creatinine and Phosphorus  Per pt and wife intake has been more around 50% of his meals. Pt likes the ensure and is drink them (2 per day). Pt had been on a Regular diet but is now back on Heart Healthy and would prefer to go back to Regular. Pt is sometimes to shaky to cut up meats, feed himself, etc. Wife tries to be here to help pt. Encouraged pt to order chopped meats when needed and instructed pt and wife how to call food orders in if unable to speak with ambassador.   Height: Ht Readings from Last 1 Encounters:  07/08/14 5\' 11"  (1.803 m)    Weight: Wt Readings from Last 1 Encounters:  08/17/14 166 lb 0.1 oz (75.3 kg)   Weight has been variable due to fluid status  BMI:  Body mass index is 23.16 kg/(m^2).  Estimated Nutritional Needs: Kcal: 2000-2300 Protein: 115-130 gm Fluid: 1.2 L  Skin: chest surgical incision, +2 generalized edema, +4 RLE and LLE edema   Diet Order: Regular Meal Completion: 100%   Intake/Output Summary (Last 24 hours) at 08/17/14 1105 Last data filed at 08/17/14 1000  Gross per 24 hour  Intake 2017.6 ml  Output   5212 ml  Net -3194.4 ml   BM: 9/13  Labs:   Recent Labs Lab 08/15/14 0444  08/16/14 0515 08/16/14 1600 08/17/14 0431  NA 132*  < > 131* 133* 134*  K 4.4  < > 4.3 4.9 4.6  CL 96  < > 95* 97 98  CO2 26  < > 25 27 26   BUN 29*  < > 22 21 19   CREATININE 2.06*  < > 1.68* 1.57* 1.72*  CALCIUM 8.0*  < > 7.8* 7.9* 8.2*  MG 2.3  --  2.3  --  2.4  PHOS  --   < > 2.3 2.1* 2.6  GLUCOSE 92  < > 82 150* 96  < > = values in this interval not displayed.  CBG (last 3)   Recent Labs  08/16/14 1630 08/16/14 2136 08/17/14 0732  GLUCAP 149* 141* 93    Scheduled Meds: . ampicillin (OMNIPEN) IV  2 g Intravenous 3 times per day  . antiseptic oral rinse  7 mL Mouth Rinse BID  . aspirin EC  81 mg Oral Daily  . atorvastatin  10 mg Oral QPM  . cefTRIAXone (ROCEPHIN)  IV  2 g Intravenous Q12H  . darbepoetin (ARANESP) injection - NON-DIALYSIS  100 mcg Subcutaneous Q Wed-HD  . feeding supplement (ENSURE COMPLETE)  237 mL Oral BID BM  . fluconazole (DIFLUCAN) IV  400 mg Intravenous Q24H  . folic acid  2 mg Oral Daily  . insulin aspart  0-15 Units Subcutaneous TID WC  . insulin aspart  0-5 Units Subcutaneous QHS  . ipratropium  0.5 mg Nebulization Q4H  . pantoprazole  40 mg Oral Daily  . pyridOXINE  100 mg Oral QPM  . vitamin B-12  1,000 mcg Oral QPM  . vitamin E  400 Units Oral Daily    Continuous Infusions: . amiodarone 30 mg/hr (08/17/14 0636)  . heparin 1,000 Units/hr (08/17/14 0903)    [place], [place], [place] Pager [phone] After Hours  Pager

## 2014-08-17 NOTE — Progress Notes (Signed)
Echocardiogram 2D Echocardiogram limited has been performed.  [place]a [place] 08/17/2014, 12:14 PM

## 2014-08-17 NOTE — Progress Notes (Addendum)
TCTS DAILY ICU PROGRESS NOTE                   [address]            [place] [id number]          [phone]   R24 Days Post-Op Procedure(s) (LRB):  ROOT REPLACEMENT WITH BIOPROSTHETIC PORCINE AORTIC ROOT REIMPLANTATION OF LEFT MAIN AND RIGHT CORONARY ARTERIES (N/A)  INTRAOPERATIVE TRANSESOPHAGEAL ECHOCARDIOGRAM (N/A)  MAZE (N/A)  CORONARY ARTERY BYPASS GRAFTING (CABG), on pump, times one, using right greater saphenous vein harvested endoscopically. (N/A)  MITRAL VALVE (MV) REPLACEMENT (N/A)  17 Days Post-Op Procedure(s) (LRB): PERMANENT PACEMAKER INSERTION (N/A)  Total Length of Stay:  LOS: 42 days   Subjective: Stable, no new issues except CRRT filter clotted this am and currently on hold.   Objective: Vital signs in last 24 hours: Temp:  [97.6 F (36.4 C)-98.9 F (37.2 C)] 98.2 F (36.8 C) (09/14 0729) Pulse Rate:  [61-63] 63 (09/14 0400) Cardiac Rhythm:  [-] Atrial fibrillation;Ventricular paced (09/14 0400) Resp:  [16-30] 20 (09/14 0800) BP: (94-136)/(38-75) 108/38 mmHg (09/14 0800) SpO2:  [94 %-100 %] 99 % (09/14 0800) Weight:  [166 lb 0.1 oz (75.3 kg)] 166 lb 0.1 oz (75.3 kg) (09/14 0500)  Filed Weights   08/15/14 0446 08/16/14 0500 08/17/14 0500  Weight: 185 lb 13.6 oz (84.3 kg) 174 lb 13.2 oz (79.3 kg) 166 lb 0.1 oz (75.3 kg)    Weight change: -8 lb 13.1 oz (-4 kg)   Hemodynamic parameters for last 24 hours:    Intake/Output from previous day: 09/13 0701 - 09/14 0700 In: 2108 [P.O.:1175; I.V.:483; IV Piggyback:450] Out: 4696 [Urine:145]  Intake/Output this shift:    Current Meds: Scheduled Meds: . ampicillin (OMNIPEN) IV  2 g Intravenous 3 times per day  . antiseptic oral rinse  7 mL Mouth Rinse BID  . aspirin EC  81 mg Oral Daily  . atorvastatin  10 mg Oral QPM  . cefTRIAXone (ROCEPHIN)  IV  2 g Intravenous Q12H  . darbepoetin (ARANESP) injection - NON-DIALYSIS  100 mcg Subcutaneous Q Wed-HD  . feeding supplement (ENSURE  COMPLETE)  237 mL Oral BID BM  . fluconazole (DIFLUCAN) IV  400 mg Intravenous Q24H  . folic acid  2 mg Oral Daily  . insulin aspart  0-15 Units Subcutaneous TID WC  . insulin aspart  0-5 Units Subcutaneous QHS  . ipratropium  0.5 mg Nebulization Q4H  . pantoprazole  40 mg Oral Daily  . pyridOXINE  100 mg Oral QPM  . vitamin B-12  1,000 mcg Oral QPM  . vitamin E  400 Units Oral Daily   Continuous Infusions: . amiodarone 30 mg/hr (08/17/14 0636)  . milrinone 0.125 mcg/kg/min (08/17/14 0636)  . dialysis replacement fluid (prismasate) 300 mL/hr at 08/16/14 1445  . dialysis replacement fluid (prismasate) 300 mL/hr at 08/16/14 1505  . dialysate (PRISMASATE) 1,500 mL/hr at 08/16/14 1508   PRN Meds:.acetaminophen, heparin, levalbuterol, ondansetron (ZOFRAN) IV, ondansetron (ZOFRAN) IV, ondansetron, promethazine, sodium chloride, traMADol, zolpidem   Physical Exam: Heart: Irr, AV paced Lungs: Few basilar crackles bilaterally Extremities: No siginificant Le edema Wound: Clean and dry    Lab Results: CBC: Recent Labs  08/16/14 0515 08/17/14 0431  WBC 4.7 4.4  HGB 9.7* 10.7*  HCT 31.2* 34.4*  PLT 78* 87*   BMET:  Recent Labs  08/16/14 1600 08/17/14 0431  NA 133* 134*  K 4.9 4.6  CL 97 98  CO2 27 26  GLUCOSE 150* 96  BUN 21 19  CREATININE 1.57* 1.72*  CALCIUM 7.9* 8.2*    PT/INR:  Recent Labs  08/17/14 0431  LABPROT 15.8*  INR 1.26   Radiology: Dg Chest Port 1 View  08/17/2014   CLINICAL DATA:  Follow-up of respiratory failure ; shortness of breath  EXAM: PORTABLE CHEST - 1 VIEW  COMPARISON:  Portable chest x-ray of August 15, 2014  FINDINGS: The lungs are adequately inflated. There are small bilateral pleural effusions greater on the right than on the left. There is no alveolar infiltrate. The cardiopericardial silhouette is top-normal in size. A valve ring is in place in the mitral position. A left atrial appendage clip is present. The patient has undergone  previous CABG. A left internal jugular venous catheter tip lies in the proximal SVC and is stable. The permanent pacemaker is unchanged in position. The observed portions of the bony thorax are unremarkable.  IMPRESSION: There has been mild interval improvement in the appearance of the pulmonary interstitium suggesting resolving pulmonary edema. There remain small bilateral pleural effusions greater on the right than on the left.   Electronically Signed   By: David  Martinique   On: 08/17/2014 07:16     Assessment/Plan: S/P Procedure(s) (LRB): PERMANENT PACEMAKER INSERTION (N/A)  CV- Acute diastolic CHF with RV failure, AHF team managing Milrinone, Amio gtts.  BPs stable.  ID- Candida parapsilosis fungemia, on Ceftriaxone/Fluconozole.  ID following.  WBC stable.  Acute/chronic renal failure- CRRT on hold this am due to filter clotting.  Renal to make recommendations AT:FTDDUKG therapy.  Fluid balance improving, no edema.  Cr up slightly this am. UOP 145 ml/past 24 hours.  Thrombocytopenia- plts slowly trending up.  Continue to watch.  Acute resp failure- pulm status stable, improving.  Sats stable on 2 L O2. Continue current care.   COLLINS,GINA H 08/17/2014 8:25 AM  I have seen and examined the patient and agree with the assessment and plan as outlined.  Looks much better.  Weight now down to what may be close to his dry weight.  Urine culture obtained at the time of blood cultures that were positive for Candida parapsilosis was never speciated.  Will ask ID team to evaluate - confirmation that the urine and blood cultures were growing the same organism would be very helpful.  Platelet count has recovered as the patient has recovered from fungal sepsis.  Will start IV heparin for anticoagulation given recent AVR +  MVR + maze and ongoing Afib.  Will need to watch platelet count carefully.  CXR looks okay with small bilateral pleural effusions R>L - continue to monitor and consider Pleur-X if  effusions reaccumulate.  Re-consult PT.  Mobilize out of bed.  D/C foley catheter.  [name],[name] 08/17/2014 8:50 AM

## 2014-08-17 NOTE — Progress Notes (Signed)
CARDIAC REHAB PHASE I   PRE:  Rate/Rhythm: 104 Pacing  BP:  Supine: 117/52  Sitting:   Standing:    SaO2: 100 2L  MODE:  Ambulation: 88 ft   POST:  Rate/Rhythm: 103 Pacing  BP:  Supine:   Sitting: 135/77  Standing:    SaO2: 932L 1100-1135 Assisted X 2 used walker and O2 2L to ambulate. Pt weak but is very determined to walk. He was able to walk 88 feet with two standing rest stops, DOE. Pt to recliner after walk with call light in reach and wife present.  Rodney Langton RN 08/17/2014 11:33 AM

## 2014-08-17 NOTE — Progress Notes (Signed)
Patient remains on 2 H.  PT continues to recommend CIR placement. Patient prefers to return to [facility] as his wife resides in the [place] area of the facility.  Patient appears to be a viable candidate for CIR per note by Danne Baxter, RN Liaison for CIR- thus- CSW will need to revisit with patient again when his condition improves towards medical stability.  Lorie Phenix. Pauline Good, [place] [facility]

## 2014-08-17 NOTE — Progress Notes (Signed)
ANTICOAGULATION CONSULT NOTE - Initial Consult  Pharmacy Consult for Heparin Indication: atrial fibrillation, recent tissue valve replacement  No Known Allergies  Patient Measurements: Height: 5\' 11"  (180.3 cm) Weight: 166 lb 0.1 oz (75.3 kg) IBW/kg (Calculated) : 75.3  Vital Signs: Temp: 98.2 F (36.8 C) (09/14 0729) Temp src: Oral (09/14 0729) BP: 108/38 mmHg (09/14 0800) Pulse Rate: 63 (09/14 0400)  Labs:  Recent Labs  08/15/14 0444  08/16/14 0515 08/16/14 1600 08/17/14 0431  HGB 9.1*  --  9.7*  --  10.7*  HCT 29.3*  --  31.2*  --  34.4*  PLT 71*  --  78*  --  87*  LABPROT 16.2*  --  16.3*  --  15.8*  INR 1.30  --  1.31  --  1.26  CREATININE 2.06*  < > 1.68* 1.57* 1.72*  < > = values in this interval not displayed.  Estimated Creatinine Clearance: 37.1 ml/min (by C-G formula based on Cr of 1.72).   Medical History: Past Medical History  Diagnosis Date  . CAD (coronary artery disease) prior stenting 1999   . Dyslipidemia   . HTN (hypertension)   . WPW (Wolff-Parkinson-White syndrome) loss of preexcitation   . Atrial fibrillation   . AV block, Mobitz II   . Presyncope   . Myocardial infarction   . Pacemaker 04/07/2013  . Arthritis   . History of chicken pox   . Anemia   . Hypernatremia 06/03/2013  . Thrombocytopenia, unspecified 06/03/2013  . Leukopenia 06/03/2013  . Obstructive sleep apnea 06/03/2013    USES CPAP  . Urinary incontinence 06/03/2013  . Hyperglycemia 12/21/2013  . Pancytopenia 06/03/2013  . Iron deficiency anemia, unspecified 07/02/2014  . Malabsorption of iron 07/02/2014  . Hypotestosteronemia 07/02/2014  . ITP (idiopathic thrombocytopenic purpura) 07/02/2014    possible - although patient had bacterial endocarditis at the time of diagnosis  . Aortic insufficiency   . Chronic kidney disease   . Bacterial endocarditis      Enterococcus sepsis with aortic valve vegetation  . UTI (urinary tract infection) 07/27/2014    ENTEROBACTER CLOACAE   . S/P  aortic valve and mitral valve replacement 07/09/2014    21 mm Medtronic Freestyle porcine aortic root graft 29 mm [facility] Mitral bovine bioprosthetic mitral valve   . S/P aortic valve replacement with stentless valve 07/18/2014    21 mm Medtronic Freestyle porcine aortic root graft with reimplantation of left main and right coronary arteries  . S/P mitral valve replacement with bioprosthetic valve 07/19/2014    29 mm [facility] Mitral bovine bioprosthetic tissue valve  . S/P CABG x 1 07/16/2014    SVG to OM1 with EVH via right thigh  . S/P Maze operation for atrial fibrillation 07/15/2014    Complete bilateral atrial lesion set using cryothermy and bipolar radiofrequency ablation with clipping of LA appendage  . Acute renal failure superimposed on stage 3 chronic kidney disease 07/27/2014  . Acute on chronic diastolic heart failure 5/46/2703    Medications:  Prescriptions prior to admission  Medication Sig Dispense Refill  . atorvastatin (LIPITOR) 10 MG tablet Take 1 tablet (10 mg total) by mouth every evening.  90 tablet  3  . Azelaic Acid (FINACEA) 15 % cream Apply 1 application topically daily. After skin is thoroughly washed and patted dry, gently but thoroughly massage a thin film of azelaic acid creame      . Cholecalciferol (VITAMIN D-3) 1000 UNITS CAPS Take 1 capsule by mouth daily.      Marland Kitchen  furosemide (LASIX) 20 MG tablet Take 3 tablets (60 mg total) by mouth daily.  90 tablet  12  . losartan (COZAAR) 50 MG tablet Take 1 tablet (50 mg total) by mouth daily.  90 tablet  3  . Multiple Vitamin (MULTIVITAMIN WITH MINERALS) TABS Take 1 tablet by mouth 2 (two) times a week.       . nadolol (CORGARD) 20 MG tablet Take 10 mg by mouth daily.      . nitroGLYCERIN (NITROSTAT) 0.4 MG SL tablet Place 1 tablet (0.4 mg total) under the tongue every 5 (five) minutes as needed for chest pain. x3 doses as needed for chest pain  25 tablet  12  . omeprazole (PRILOSEC) 20 MG capsule Take 20 mg by mouth  daily as needed (for heartburn).       Vladimir Faster Glycol-Propyl Glycol (SYSTANE ULTRA OP) Apply 1 drop to eye 4 (four) times daily as needed (for dry eyes).      . predniSONE (DELTASONE) 20 MG tablet Take 80 mg by mouth daily with breakfast.      . Probiotic Product (PROBIOTIC DAILY PO) Take 1 capsule by mouth daily.       Marland Kitchen pyridOXINE (VITAMIN B-6) 100 MG tablet Take 50 mg by mouth every evening.       . tamsulosin (FLOMAX) 0.4 MG CAPS capsule Take 0.4 mg by mouth at bedtime.      . vitamin B-12 (CYANOCOBALAMIN) 1000 MCG tablet Take 1,000 mcg by mouth every evening.      . Vitamin E (VITA-PLUS E PO) Take 1 capsule by mouth daily.        Assessment: 80 yo M admitted 07/24/2014 [facility] 8/21: porcine aortic root/valve, Pericardial tissue MVR, CABG x 1, Maze pacer extraction. Post op sepsis with thrombocytopenia and ARF.  Pharmacy consulted to renally adjust antibiotics & resume anticoagulation for afib 9/14.  AC/Heme: Afib-no AC PTA d/t recent GIB and low pltc. Warfarin on hold since 9/5 per Dr. Marin Olp, f/u restart when he returns to town - hold for now d/t plt and eventual d/c HD cath, hep with CVVH ITP/IDA: 2 wk hx ITP/IDA w/steroids (7/31) / IVIG (8/3 per Dr Marin Olp). Dr. Marin Olp following -NPLATE (romiplostim- CSF for chronic ITP) last given 2 mcg/kg 9/7; IVIG thru 9/11. Onc started folate, off steroid. Aranesp qWed, (Fe low, Feraheme 1020mg  8/27) .   H/h stable, platelets markedly improved.  Renal: AoCKD--CVVH since 9/8, off 9/14    BP: SCDs, PPI  Goal of Therapy:  Heparin level 0.3-0.7 units/ml Monitor platelets by anticoagulation protocol: Yes Renal adjustment of antibiotics   Plan:  1. Heparin 1000 units/hr, NO Bolus 2. Check heparin level 8h after ggt started  Thank you for allowing pharmacy to be a part of this patients care team.  Rowe Robert Pharm.D., BCPS, AQ-Cardiology Clinical Pharmacist 08/17/2014 8:59 AM Pager: [phone] Phone: [phone]

## 2014-08-17 NOTE — Progress Notes (Signed)
Subjective:   CVVHD stopped this am d/t filter clotted. Weight down another 7 lbs and below baseline.   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 No obvious vegetation  Co-ox 87% on milrinone 0.125 mcg and amiodarone gtt for afib.    Objective:   Scheduled Meds: . ampicillin (OMNIPEN) IV  2 g Intravenous 3 times per day  . antiseptic oral rinse  7 mL Mouth Rinse BID  . aspirin EC  81 mg Oral Daily  . atorvastatin  10 mg Oral QPM  . cefTRIAXone (ROCEPHIN)  IV  2 g Intravenous Q12H  . darbepoetin (ARANESP) injection - NON-DIALYSIS  100 mcg Subcutaneous Q Wed-HD  . feeding supplement (ENSURE COMPLETE)  237 mL Oral BID BM  . fluconazole (DIFLUCAN) IV  400 mg Intravenous Q24H  . folic acid  2 mg Oral Daily  . insulin aspart  0-15 Units Subcutaneous TID WC  . insulin aspart  0-5 Units Subcutaneous QHS  . ipratropium  0.5 mg Nebulization Q4H  . pantoprazole  40 mg Oral Daily  . pyridOXINE  100 mg Oral QPM  . vitamin B-12  1,000 mcg Oral QPM  . vitamin E  400 Units Oral Daily   Continuous Infusions: . amiodarone 30 mg/hr (08/17/14 0636)  . milrinone 0.125 mcg/kg/min (08/17/14 0636)  . dialysis replacement fluid (prismasate) 300 mL/hr at 08/16/14 1445  . dialysis replacement fluid (prismasate) 300 mL/hr at 08/16/14 1505  . dialysate (PRISMASATE) 1,500 mL/hr at 08/16/14 1508   PRN Meds:.acetaminophen, heparin, levalbuterol, ondansetron (ZOFRAN) IV, ondansetron (ZOFRAN) IV, ondansetron, promethazine, sodium chloride, traMADol, zolpidem  Filed Vitals:   08/17/14 0300 08/17/14 0400 08/17/14 0500 08/17/14 0600  BP: 115/51 129/41 118/57 114/60  Pulse:  63    Temp:  98.9 F (37.2 C)    TempSrc:  Oral    Resp: 18 17 16 24   Height:      Weight:   166 lb 0.1 oz (75.3 kg)   SpO2: 99% 100% 94% 100%    Intake/Output from previous day:  Intake/Output Summary (Last 24 hours) at 08/17/14  0724 Last data filed at 08/17/14 0600  Gross per 24 hour  Intake 2091.3 ml  Output   6839 ml  Net -4747.7 ml    Physical Exam: Physical exam:  Comfortable Skin is warm and dry.  HEENT is normal.  Neck is supple. JVP 7-8  LIJ trialysis cath Chest clear. Pacemaker site with no hematoma Cardiovascular exam is regular rate and rhythm. 2/6 systolic murmur Abdominal exam nontender or distended. No masses palpated. Extremities show . no edema. LEs wrapped neuro grossly intact    Lab Results: Basic Metabolic Panel:  Recent Labs  08/16/14 0515 08/16/14 1600 08/17/14 0431  NA 131* 133* 134*  K 4.3 4.9 4.6  CL 95* 97 98  CO2 25 27 26   GLUCOSE 82 150* 96  BUN 22 21 19   CREATININE 1.68* 1.57* 1.72*  CALCIUM 7.8* 7.9* 8.2*  MG 2.3  --  2.4  PHOS 2.3 2.1* 2.6   CBC:  Recent Labs  08/15/14 0444 08/16/14 0515 08/17/14 0431  WBC 4.8 4.7 4.4  NEUTROABS 3.5  --   --   HGB 9.1* 9.7* 10.7*  HCT 29.3* 31.2* 34.4*  MCV 102.8* 102.6* 100.9*  PLT 71* 78* 87*     Assessment/Plan:  1. Status post aortic root replacement, bioprosthetic aortic valve replacement, bioprosthetic mitral valve replacement, coronary artery bypass graft (  to LCx system) and maze-Continue rehab. 2. Acute on chronic kidney failure-nephrology following. Significantly volume overloaded, getting CVVH. 3. Thrombocytopenia-improved. Suspect ITP.  Hematology following. 4. Paroxysmal atrial fibrillation-patient is in AV paced rhythm today. Continue amiodarone drip for now. Coumadin held for thrombocytopenia. 5. CAD-continue aspirin and statin. Had SVG-LCx system with recent cardiac surgery.  6. Status post pacemaker for CHB 7. Enterococcal endocarditis-continue antibiotics;  8. Acute diastolic CHF with prominent RV failure.  He is on milrinone gtt for RV support.  EF 50-55%-> 35-40%.  9. Acute respiratory failure 10. Fungemia/funguria 11. Pleural effusions: s/p thoracentesis on right.   PLAN/DISCUSSION    CVVHD stopped this am d/t clotting of filter. Renal to come back by this am and reassess about restarting or just holding off and see what his kidneys do on their own. Patient remains olgiuric, 145 cc out in the past 24 hrs.   Agree with Dr. Roxy Manns that he likely is not good TEE candidate currently and probably wouldn't change our management at this point as he is not a re-operative candidate. No obvious vegetations on TTE. Continue abx and antifungals.   Remains on milrinone 0.125 mcg and co-ox good, will wean off and continue to follow. Continues in Afib and on amiodarone gtt.   Platelets trending up slowly. No bleeding  Junie Bame B NP-C 7:30 AM  Patient seen and examined with Junie Bame, NP. We discussed all aspects of the encounter. I agree with the assessment and plan as stated above.   Feeling better. CVVHD stopped. Urine output remains poor. Co-ox ok on lower dose milrinone. Will stop milrinone. Continue abx and anti-fungals. Await to see if kidneys recover or not.   Arieal Cuoco,MD 7:07 PM

## 2014-08-18 ENCOUNTER — Inpatient Hospital Stay (HOSPITAL_COMMUNITY): Payer: Medicare Other

## 2014-08-18 DIAGNOSIS — B377 Candidal sepsis: Secondary | ICD-10-CM

## 2014-08-18 DIAGNOSIS — E46 Unspecified protein-calorie malnutrition: Secondary | ICD-10-CM

## 2014-08-18 LAB — PROTIME-INR
INR: 1.3 (ref 0.00–1.49)
PROTHROMBIN TIME: 16.2 s — AB (ref 11.6–15.2)

## 2014-08-18 LAB — RENAL FUNCTION PANEL
ALBUMIN: 2 g/dL — AB (ref 3.5–5.2)
Albumin: 2 g/dL — ABNORMAL LOW (ref 3.5–5.2)
Anion gap: 15 (ref 5–15)
Anion gap: 15 (ref 5–15)
BUN: 36 mg/dL — AB (ref 6–23)
BUN: 43 mg/dL — AB (ref 6–23)
CALCIUM: 8.4 mg/dL (ref 8.4–10.5)
CALCIUM: 8.4 mg/dL (ref 8.4–10.5)
CO2: 23 mEq/L (ref 19–32)
CO2: 23 meq/L (ref 19–32)
CREATININE: 2.81 mg/dL — AB (ref 0.50–1.35)
CREATININE: 3.09 mg/dL — AB (ref 0.50–1.35)
Chloride: 92 mEq/L — ABNORMAL LOW (ref 96–112)
Chloride: 93 mEq/L — ABNORMAL LOW (ref 96–112)
GFR calc Af Amer: 23 mL/min — ABNORMAL LOW (ref >90)
GFR calc Af Amer: 21 mL/min — ABNORMAL LOW (ref >90)
GFR calc non Af Amer: 20 mL/min — ABNORMAL LOW (ref >90)
GFR, EST NON AFRICAN AMERICAN: 18 mL/min — AB (ref >90)
Glucose, Bld: 78 mg/dL (ref 70–99)
Glucose, Bld: 110 mg/dL — ABNORMAL HIGH (ref 70–99)
PHOSPHORUS: 4.1 mg/dL (ref 2.3–4.6)
Phosphorus: 5 mg/dL — ABNORMAL HIGH (ref 2.3–4.6)
Potassium: 5.3 mEq/L (ref 3.7–5.3)
Potassium: 5 mEq/L (ref 3.7–5.3)
Sodium: 130 mEq/L — ABNORMAL LOW (ref 137–147)
Sodium: 131 mEq/L — ABNORMAL LOW (ref 137–147)

## 2014-08-18 LAB — CBC
HCT: 31.8 % — ABNORMAL LOW (ref 39.0–52.0)
Hemoglobin: 10 g/dL — ABNORMAL LOW (ref 13.0–17.0)
MCH: 31.3 pg (ref 26.0–34.0)
MCHC: 31.4 g/dL (ref 30.0–36.0)
MCV: 99.4 fL (ref 78.0–100.0)
PLATELETS: 113 10*3/uL — AB (ref 150–400)
RBC: 3.2 MIL/uL — ABNORMAL LOW (ref 4.22–5.81)
RDW: 22.4 % — AB (ref 11.5–15.5)
WBC: 6.4 10*3/uL (ref 4.0–10.5)

## 2014-08-18 LAB — URINE CULTURE

## 2014-08-18 LAB — RETICULOCYTES
RBC.: 3.2 MIL/uL — AB (ref 4.22–5.81)
Retic Count, Absolute: 137.6 10*3/uL (ref 19.0–186.0)
Retic Ct Pct: 4.3 % — ABNORMAL HIGH (ref 0.4–3.1)

## 2014-08-18 LAB — HEPARIN LEVEL (UNFRACTIONATED): HEPARIN UNFRACTIONATED: 0.45 [IU]/mL (ref 0.30–0.70)

## 2014-08-18 LAB — CARBOXYHEMOGLOBIN
Carboxyhemoglobin: 2.2 % — ABNORMAL HIGH (ref 0.5–1.5)
METHEMOGLOBIN: 0.6 % (ref 0.0–1.5)
O2 Saturation: 77 %
Total hemoglobin: 10 g/dL — ABNORMAL LOW (ref 13.5–18.0)

## 2014-08-18 LAB — GLUCOSE, CAPILLARY
GLUCOSE-CAPILLARY: 73 mg/dL (ref 70–99)
GLUCOSE-CAPILLARY: 125 mg/dL — AB (ref 70–99)
GLUCOSE-CAPILLARY: 114 mg/dL — AB (ref 70–99)
Glucose-Capillary: 104 mg/dL — ABNORMAL HIGH (ref 70–99)

## 2014-08-18 LAB — LACTATE DEHYDROGENASE: LDH: 673 U/L — ABNORMAL HIGH (ref 94–250)

## 2014-08-18 MED ORDER — DARBEPOETIN ALFA-POLYSORBATE 100 MCG/0.5ML IJ SOLN
100.0000 ug | INTRAMUSCULAR | Status: DC
  Filled 2014-08-18 (×2): qty 0.5

## 2014-08-18 NOTE — Progress Notes (Signed)
INFECTIOUS DISEASE PROGRESS NOTE  ID: Justin [name] is a 80 y.o. 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 Active Problems:   CAD (coronary artery disease)   HTN (hypertension)   Atrial fibrillation   Anemia   Thrombocytopenia   Obstructive sleep apnea   Chronotropic incompetence with sinus node dysfunction   Mitral regurgitation   SOB (shortness of breath)   CHF (congestive heart failure)   Diastolic congestive heart failure   Sepsis   Enterococcal bacteremia   TIA (transient ischemic attack)   Aortic insufficiency   Chronic kidney disease   S/P aortic valve replacement with stentless valve   S/P mitral valve replacement with bioprosthetic valve   S/P CABG x 1   S/P Maze operation for atrial fibrillation   Acute renal failure superimposed on stage 3 chronic kidney disease   Acute on chronic diastolic heart failure   Fungemia  Subjective: C/o nasal congestion.  Waiting to go walking.   Abtx:  Anti-infectives   Start     Dose/Rate Route Frequency Ordered Stop   08/16/14 1500  fluconazole (DIFLUCAN) IVPB 200 mg  Status:  Discontinued     200 mg 100 mL/hr over 60 Minutes Intravenous Every 24 hours 08/15/14 1428 08/16/14 0857   08/16/14 1500  fluconazole (DIFLUCAN) IVPB 400 mg     400 mg 100 mL/hr over 120 Minutes Intravenous Every 24 hours 08/16/14 0857     08/15/14 1500  fluconazole (DIFLUCAN) IVPB 400 mg     400 mg 100 mL/hr over 120 Minutes Intravenous  Once 08/15/14 1428 08/15/14 1841   08/13/14 1500  micafungin (MYCAMINE) 100 mg in sodium chloride 0.9 % 100 mL IVPB  Status:  Discontinued     100 mg 100 mL/hr over 1 Hours Intravenous Every 24 hours 08/13/14 1421 08/15/14 1428   08/13/14 1430  micafungin (MYCAMINE) 100 mg in sodium chloride 0.9 % 100 mL IVPB  Status:  Discontinued     100 mg 100 mL/hr over 1 Hours Intravenous Daily 08/13/14 1421 08/13/14 1422   08/13/14 1000  fluconazole  (DIFLUCAN) IVPB 100 mg  Status:  Discontinued     100 mg 50 mL/hr over 60 Minutes Intravenous Every 24 hours 08/13/14 0856 08/13/14 1421   07/18/2014 2200  ceFAZolin (ANCEF) IVPB 1 g/50 mL premix  Status:  Discontinued     1 g 100 mL/hr over 30 Minutes Intravenous 3 times per day 07/05/2014 1904 08/01/14 1252   07/27/2014 1915  gentamicin (GARAMYCIN) 80 mg in sodium chloride irrigation 0.9 % 500 mL irrigation  Status:  Discontinued     80 mg Irrigation On call 07/30/2014 1904 07/09/2014 1913   07/27/2014 1915  ceFAZolin (ANCEF) IVPB 2 g/50 mL premix  Status:  Discontinued     2 g 100 mL/hr over 30 Minutes Intravenous On call 08/03/2014 1904 08/01/2014 1913   07/30/2014 1315  gentamicin (GARAMYCIN) 80 mg in sodium chloride irrigation 0.9 % 500 mL irrigation  Status:  Discontinued      Irrigation To Cath Lab 07/30/2014 1313 08/01/14 0955   07/18/2014 1306  ceFAZolin (ANCEF) 2-3 GM-% IVPB SOLR    Comments:  Nadal, Cindy   : cabinet override      07/05/2014 1306 07/13/2014 1306   07/27/14 1500  ampicillin (OMNIPEN) 2 g in sodium chloride 0.9 % 50 mL IVPB     2 g 150 mL/hr over 20 Minutes Intravenous 3 times per day 07/27/14 1116  07/26/14 1200  cefTRIAXone (ROCEPHIN) 2 g in dextrose 5 % 50 mL IVPB     2 g 100 mL/hr over 30 Minutes Intravenous Every 12 hours 07/26/14 1131     07/25/14 0000  cefUROXime (ZINACEF) 1.5 g in dextrose 5 % 50 mL IVPB  Status:  Discontinued     1.5 g 100 mL/hr over 30 Minutes Intravenous Every 12 hours 07/11/2014 2004 07/26/14 1131   07/25/2014 2200  vancomycin (VANCOCIN) IVPB 1000 mg/200 mL premix     1,000 mg 200 mL/hr over 60 Minutes Intravenous  Once 08/02/2014 2004 07/17/2014 2346   07/13/2014 1745  vancomycin (VANCOCIN) 1,000 mg in sodium chloride 0.9 % 1,000 mL irrigation      Irrigation To Surgery 07/23/2014 1731 07/14/2014 1802   07/20/2014 0400  vancomycin (VANCOCIN) 1,250 mg in sodium chloride 0.9 % 250 mL IVPB     1,250 mg 166.7 mL/hr over 90 Minutes Intravenous To Surgery 07/23/14 1114  07/04/2014 0740   07/27/2014 0400  cefUROXime (ZINACEF) 1.5 g in dextrose 5 % 50 mL IVPB     1.5 g 100 mL/hr over 30 Minutes Intravenous To Surgery 07/23/14 1114 07/29/2014 1509   07/29/2014 0400  cefUROXime (ZINACEF) 750 mg in dextrose 5 % 50 mL IVPB  Status:  Discontinued     750 mg 100 mL/hr over 30 Minutes Intravenous To Surgery 07/23/14 1114 08/03/2014 1944   07/23/2014 0400  vancomycin (VANCOCIN) 1,000 mg in sodium chloride 0.9 % 1,000 mL irrigation      Irrigation To Surgery 07/23/14 1921 07/29/2014 1058   07/16/14 1300  [MAR Hold]  gentamicin (GARAMYCIN) IVPB 100 mg  Status:  Discontinued     (On MAR Hold since 07/29/2014 0627)   100 mg 200 mL/hr over 30 Minutes Intravenous Every 24 hours 07/15/14 1917 08/01/2014 1944   07/11/2014 1045  gentamicin (GARAMYCIN) 80 mg in sodium chloride irrigation 0.9 % 500 mL irrigation  Status:  Discontinued     80 mg Irrigation On call 07/06/2014 1031 07/04/2014 1805   07/12/14 1300  gentamicin (GARAMYCIN) IVPB 80 mg  Status:  Discontinued     80 mg 100 mL/hr over 30 Minutes Intravenous Every 24 hours 07/12/14 1200 07/15/14 1917   07/11/14 1200  ampicillin (OMNIPEN) 2 g in sodium chloride 0.9 % 50 mL IVPB  Status:  Discontinued     2 g 150 mL/hr over 20 Minutes Intravenous 4 times per day 07/11/14 0945 07/27/14 1116   07/07/14 0030  piperacillin-tazobactam (ZOSYN) IVPB 3.375 g  Status:  Discontinued     3.375 g 12.5 mL/hr over 240 Minutes Intravenous Every 8 hours 07/24/2014 2042 07/08/14 1602   07/05/2014 2300  levofloxacin (LEVAQUIN) IVPB 750 mg  Status:  Discontinued     750 mg 100 mL/hr over 90 Minutes Intravenous Every 48 hours 07/15/2014 2232 07/08/14 1022   07/28/2014 1615  piperacillin-tazobactam (ZOSYN) IVPB 3.375 g     3.375 g 100 mL/hr over 30 Minutes Intravenous  Once 07/14/2014 1604 07/17/2014 1755   07/20/2014 1615  vancomycin (VANCOCIN) IVPB 1000 mg/200 mL premix     1,000 mg 200 mL/hr over 60 Minutes Intravenous  Once 07/08/2014 1604 07/23/14 0709   07/08/2014 0030   vancomycin (VANCOCIN) IVPB 750 mg/150 ml premix  Status:  Discontinued     750 mg 150 mL/hr over 60 Minutes Intravenous Every 12 hours 07/23/2014 2042 07/11/14 0945      Medications:  Scheduled: . ampicillin (OMNIPEN) IV  2 g Intravenous 3 times per  day  . antiseptic oral rinse  7 mL Mouth Rinse BID  . aspirin EC  81 mg Oral Daily  . atorvastatin  10 mg Oral QPM  . cefTRIAXone (ROCEPHIN)  IV  2 g Intravenous Q12H  . [START ON 08/19/2014] darbepoetin (ARANESP) injection - NON-DIALYSIS  100 mcg Subcutaneous Q Wed-1800  . feeding supplement (ENSURE COMPLETE)  237 mL Oral BID BM  . fluconazole (DIFLUCAN) IV  400 mg Intravenous Q24H  . folic acid  2 mg Oral Daily  . insulin aspart  0-15 Units Subcutaneous TID WC  . insulin aspart  0-5 Units Subcutaneous QHS  . pantoprazole  40 mg Oral Daily  . pyridOXINE  100 mg Oral QPM  . vitamin B-12  1,000 mcg Oral QPM  . vitamin E  400 Units Oral Daily    Objective: Vital signs in last 24 hours: Temp:  [98.2 F (36.8 C)-98.6 F (37 C)] 98.2 F (36.8 C) (09/15 1200) Resp:  [16-25] 20 (09/15 1300) BP: (86-148)/(50-91) 130/57 mmHg (09/15 1200) SpO2:  [89 %-100 %] 100 % (09/15 1300) Weight:  [77.8 kg (171 lb 8.3 oz)-79.1 kg (174 lb 6.1 oz)] 79.1 kg (174 lb 6.1 oz) (09/15 1100)   General appearance: alert, cooperative and no distress Neck: L neck central line Resp: clear to auscultation bilaterally Chest wall: no tenderness, pacer L upper chest Cardio: regular rate and rhythm GI: normal findings: bowel sounds normal and soft, non-tender  Lab Results  Recent Labs  08/17/14 0431 08/17/14 1500 08/18/14 0400  WBC 4.4  --  6.4  HGB 10.7*  --  10.0*  HCT 34.4*  --  31.8*  NA 134* 131* 130*  K 4.6 5.1 5.3  CL 98 95* 92*  CO2 26 25 23   BUN 19 29* 36*  CREATININE 1.72* 2.39* 2.81*   Liver Panel  Recent Labs  08/17/14 1500 08/18/14 0400  ALBUMIN 2.0* 2.0*   Sedimentation Rate No results found for this basename: ESRSEDRATE,  in the  last 72 hours C-Reactive Protein No results found for this basename: CRP,  in the last 72 hours  Microbiology: Recent Results (from the past 240 hour(s))  CULTURE, BLOOD (ROUTINE X 2)     Status: None   Collection Time    08/11/14  8:35 AM      Result Value Ref Range Status   Specimen Description BLOOD RIGHT HAND   Final   Special Requests BOTTLES DRAWN AEROBIC ONLY 4CC   Final   Culture  Setup Time     Final   Value: 08/11/2014 14:03     Performed at [facility]   Culture     Final   Value: NO GROWTH 5 DAYS     Performed at [facility]   Report Status 08/17/2014 FINAL   Final  CULTURE, BLOOD (ROUTINE X 2)     Status: None   Collection Time    08/11/14  8:45 AM      Result Value Ref Range Status   Specimen Description BLOOD RIGHT HAND   Final   Special Requests BOTTLES DRAWN AEROBIC ONLY [facility]   Final   Culture  Setup Time     Final   Value: 08/11/2014 14:03     Performed at [facility]   Culture     Final   Value: CANDIDA PARAPSILOSIS     Note: Gram Stain Report Called to,Read Back By and Verified With: CRYSTAL RICE 08/13/14 1120 BY SMITHERSJ  Performed at [facility]   Report Status 08/15/2014 FINAL   Final  URINE CULTURE     Status: None   Collection Time    08/11/14 11:44 AM      Result Value Ref Range Status   Specimen Description URINE, CATHETERIZED   Final   Special Requests Immunocompromised   Final   Culture  Setup Time     Final   Value: 08/11/2014 17:21     Performed at [place]     Final   Value: 65,000 COLONIES/ML     Performed at [facility]   Culture     Final   Value: YEAST     Performed at [facility]   Report Status 08/12/2014 FINAL   Final  AFB CULTURE WITH SMEAR     Status: None   Collection Time    08/12/14  4:33 PM      Result Value Ref Range Status   Specimen Description FLUID RIGHT PLEURAL   Final   Special Requests Normal   Final   Acid Fast Smear     Final     Value: NO ACID FAST BACILLI SEEN     Performed at [facility]   Culture     Final   Value: CULTURE WILL BE EXAMINED FOR 6 WEEKS BEFORE ISSUING A FINAL REPORT     Performed at [facility]   Report Status PENDING   Incomplete  BODY FLUID CULTURE     Status: None   Collection Time    08/12/14  4:42 PM      Result Value Ref Range Status   Specimen Description FLUID RIGHT PLEURAL   Final   Special Requests Normal   Final   Gram Stain     Final   Value: RARE WBC PRESENT,BOTH PMN AND MONONUCLEAR     NO ORGANISMS SEEN     Performed at [facility]   Culture     Final   Value: NO GROWTH 3 DAYS     Performed at [facility]   Report Status 08/16/2014 FINAL   Final  FUNGUS CULTURE W SMEAR     Status: None   Collection Time    08/12/14  4:52 PM      Result Value Ref Range Status   Specimen Description FLUID RIGHT PLEURAL   Final   Special Requests NONE   Final   Fungal Smear     Final   Value: NO YEAST OR FUNGAL ELEMENTS SEEN     Performed at [facility]   Culture     Final   Value: CULTURE IN PROGRESS FOR FOUR WEEKS     Performed at [facility]   Report Status PENDING   Incomplete  BODY FLUID CULTURE     Status: None   Collection Time    08/13/14  3:00 PM      Result Value Ref Range Status   Specimen Description PLEURAL FLUID LEFT   Final   Special Requests NONE   Final   Gram Stain     Final   Value: NO WBC SEEN     NO ORGANISMS SEEN     Performed at [facility]   Culture     Final   Value: NO GROWTH 3 DAYS     Performed at [facility]   Report Status 08/16/2014 FINAL   Final  CULTURE, EXPECTORATED  SPUTUM-ASSESSMENT     Status: None   Collection Time    08/13/14  6:02 PM      Result Value Ref Range Status   Specimen Description SPUTUM   Final   Special Requests Immunocompromised   Final   Sputum evaluation     Final   Value: MICROSCOPIC FINDINGS SUGGEST THAT THIS SPECIMEN IS NOT REPRESENTATIVE OF  LOWER RESPIRATORY SECRETIONS. PLEASE RECOLLECT.     Loletha Carrow RN [phone] 08/13/14 A BROWNING   Report Status 08/13/2014 FINAL   Final  CULTURE, BLOOD (ROUTINE X 2)     Status: None   Collection Time    08/14/14 12:21 PM      Result Value Ref Range Status   Specimen Description BLOOD RIGHT FOREARM   Final   Special Requests BOTTLES DRAWN AEROBIC AND ANAEROBIC 10CC   Final   Culture  Setup Time     Final   Value: 08/14/2014 15:48     Performed at [facility]   Culture     Final   Value:        BLOOD CULTURE RECEIVED NO GROWTH TO DATE CULTURE WILL BE HELD FOR 5 DAYS BEFORE ISSUING A FINAL NEGATIVE REPORT     Performed at [facility]   Report Status PENDING   Incomplete  CULTURE, BLOOD (ROUTINE X 2)     Status: None   Collection Time    08/14/14  3:42 PM      Result Value Ref Range Status   Specimen Description BLOOD HEMODIALYSIS LINE   Final   Special Requests BOTTLES DRAWN AEROBIC AND ANAEROBIC 10CC   Final   Culture  Setup Time     Final   Value: 08/14/2014 18:57     Performed at [facility]   Culture     Final   Value:        BLOOD CULTURE RECEIVED NO GROWTH TO DATE CULTURE WILL BE HELD FOR 5 DAYS BEFORE ISSUING A FINAL NEGATIVE REPORT     Performed at [facility]   Report Status PENDING   Incomplete  MRSA PCR SCREENING     Status: None   Collection Time    08/17/14 11:42 AM      Result Value Ref Range Status   MRSA by PCR NEGATIVE  NEGATIVE Final   Comment:            The GeneXpert MRSA Assay (FDA     approved for NASAL specimens     only), is one component of a     comprehensive MRSA colonization     surveillance program. It is not     intended to diagnose MRSA     infection nor to guide or     monitor treatment for     MRSA infections.    Studies/Results: Dg Chest Port 1 View  08/18/2014   CLINICAL DATA:  Pleural effusions  EXAM: PORTABLE CHEST - 1 VIEW  COMPARISON:  August 17, 2014  FINDINGS: Pleural effusions bilaterally  are stable. There is cardiomegaly with pulmonary vascularity within normal limits. The interstitium is mildly prominent, probably representing mild edema. There is no airspace consolidation.  Central catheter tip is in the superior cava. Pacemaker leads are attached to the right atrium right ventricle. There is evident of prosthetic cardiac valve. There is a left atrial clamp. No adenopathy. No pneumothorax.  IMPRESSION: Evidence of a degree of congestive heart failure. Effusions appear stable. No pneumothorax.  Electronically Signed   By: Lowella Grip M.D.   On: 08/18/2014 12:14   Dg Chest Port 1 View  08/17/2014   CLINICAL DATA:  Follow-up of respiratory failure ; shortness of breath  EXAM: PORTABLE CHEST - 1 VIEW  COMPARISON:  Portable chest x-ray of August 15, 2014  FINDINGS: The lungs are adequately inflated. There are small bilateral pleural effusions greater on the right than on the left. There is no alveolar infiltrate. The cardiopericardial silhouette is top-normal in size. A valve ring is in place in the mitral position. A left atrial appendage clip is present. The patient has undergone previous CABG. A left internal jugular venous catheter tip lies in the proximal SVC and is stable. The permanent pacemaker is unchanged in position. The observed portions of the bony thorax are unremarkable.  IMPRESSION: There has been mild interval improvement in the appearance of the pulmonary interstitium suggesting resolving pulmonary edema. There remain small bilateral pleural effusions greater on the right than on the left.   Electronically Signed   By: David  Martinique   On: 08/17/2014 07:16     Assessment/Plan: Fungemia/ C parapsilosis  ESRD  AVR/MVR (08/03/2014) (tissue cx enterococcus)  Pacer placement (8-15)  Enterococcal endocarditis (07/19/2014) end date ceftriaxone/amp 08/30/2014  Protein-albumin malnutrition  Total days of antibiotics  8-3 Amp  8-23 Ceftriaxone  8-9 Gent 8-21  9-9 Micafungin  9-12  9-12 Fluconazole  Plan to complete his amp/ceftriaxone on 9-17 Plan for 1 month of fluconazole, this could be change to PO if he goes to rehab.  UCx yeast speciation is C albicans He appears to be making some progress.           Bobby Rumpf Infectious Diseases (pager) [phone] [email or web address] 08/18/2014, 1:25 PM  LOS: 43 days

## 2014-08-18 NOTE — Progress Notes (Signed)
CARDIAC REHAB PHASE I   PRE:  Rate/Rhythm: 60 pacing    BP: sitting 124/57    SaO2: 100 2L  MODE:  Ambulation: 250 ft   POST:  Rate/Rhythm: 60 pacing    BP: sitting 162/72     SaO2: 97 2L  Pt very anxious to walk. Used RW, 2L, gait belt, RN assist (x2). Pt weak. Rest x5 due to [employer] and for energy conservation. Pushed himself for 250 ft. To bed after walk, exhausted. Sts he doesn't think his SOB is worse than yesterday. Will f/u. [place], August, ACSM 08/18/2014 3:29 PM

## 2014-08-18 NOTE — Progress Notes (Signed)
ANTICOAGULATION CONSULT NOTE - Follow Up Consult  Pharmacy Consult for Heparin Indication: atrial fibrillation, recent tissue valve replacement  No Known Allergies  Patient Measurements: Height: 5\' 11"  (180.3 cm) Weight: 171 lb 8.3 oz (77.8 kg) IBW/kg (Calculated) : 75.3  Vital Signs: Temp: 98.6 F (37 C) (09/15 0400) Temp src: Oral (09/15 0400) BP: 144/91 mmHg (09/15 0400)  Labs:  Recent Labs  08/16/14 0515  08/17/14 0431 08/17/14 1500 08/17/14 1800 08/18/14 0400  HGB 9.7*  --  10.7*  --   --  10.0*  HCT 31.2*  --  34.4*  --   --  31.8*  PLT 78*  --  87*  --   --  113*  LABPROT 16.3*  --  15.8*  --   --  16.2*  INR 1.31  --  1.26  --   --  1.30  HEPARINUNFRC  --   --   --   --  0.31 0.45  CREATININE 1.68*  < > 1.72* 2.39*  --  2.81*  < > = values in this interval not displayed.  Estimated Creatinine Clearance: 22.7 ml/min (by C-G formula based on Cr of 2.81).   Medical History: Past Medical History  Diagnosis Date  . CAD (coronary artery disease) prior stenting 1999   . Dyslipidemia   . HTN (hypertension)   . WPW (Wolff-Parkinson-White syndrome) loss of preexcitation   . Atrial fibrillation   . AV block, Mobitz II   . Presyncope   . Myocardial infarction   . Pacemaker 04/07/2013  . Arthritis   . History of chicken pox   . Anemia   . Hypernatremia 06/03/2013  . Thrombocytopenia, unspecified 06/03/2013  . Leukopenia 06/03/2013  . Obstructive sleep apnea 06/03/2013    USES CPAP  . Urinary incontinence 06/03/2013  . Hyperglycemia 12/21/2013  . Pancytopenia 06/03/2013  . Iron deficiency anemia, unspecified 07/02/2014  . Malabsorption of iron 07/02/2014  . Hypotestosteronemia 07/02/2014  . ITP (idiopathic thrombocytopenic purpura) 07/02/2014    possible - although patient had bacterial endocarditis at the time of diagnosis  . Aortic insufficiency   . Chronic kidney disease   . Bacterial endocarditis      Enterococcus sepsis with aortic valve vegetation  . UTI (urinary  tract infection) 07/29/2014    ENTEROBACTER CLOACAE   . S/P aortic valve and mitral valve replacement 07/10/2014    21 mm Medtronic Freestyle porcine aortic root graft 29 mm [facility] Mitral bovine bioprosthetic mitral valve   . S/P aortic valve replacement with stentless valve 07/29/2014    21 mm Medtronic Freestyle porcine aortic root graft with reimplantation of left main and right coronary arteries  . S/P mitral valve replacement with bioprosthetic valve 07/23/2014    29 mm [facility] Mitral bovine bioprosthetic tissue valve  . S/P CABG x 1 07/20/2014    SVG to OM1 with EVH via right thigh  . S/P Maze operation for atrial fibrillation 07/10/2014    Complete bilateral atrial lesion set using cryothermy and bipolar radiofrequency ablation with clipping of LA appendage  . Acute renal failure superimposed on stage 3 chronic kidney disease 07/27/2014  . Acute on chronic diastolic heart failure 5/73/2202    Medications:  Prescriptions prior to admission  Medication Sig Dispense Refill  . atorvastatin (LIPITOR) 10 MG tablet Take 1 tablet (10 mg total) by mouth every evening.  90 tablet  3  . Azelaic Acid (FINACEA) 15 % cream Apply 1 application topically daily. After skin is thoroughly washed  and patted dry, gently but thoroughly massage a thin film of azelaic acid creame      . Cholecalciferol (VITAMIN D-3) 1000 UNITS CAPS Take 1 capsule by mouth daily.      . furosemide (LASIX) 20 MG tablet Take 3 tablets (60 mg total) by mouth daily.  90 tablet  12  . losartan (COZAAR) 50 MG tablet Take 1 tablet (50 mg total) by mouth daily.  90 tablet  3  . Multiple Vitamin (MULTIVITAMIN WITH MINERALS) TABS Take 1 tablet by mouth 2 (two) times a week.       . nadolol (CORGARD) 20 MG tablet Take 10 mg by mouth daily.      . nitroGLYCERIN (NITROSTAT) 0.4 MG SL tablet Place 1 tablet (0.4 mg total) under the tongue every 5 (five) minutes as needed for chest pain. x3 doses as needed for chest pain  25 tablet  12   . omeprazole (PRILOSEC) 20 MG capsule Take 20 mg by mouth daily as needed (for heartburn).       Vladimir Faster Glycol-Propyl Glycol (SYSTANE ULTRA OP) Apply 1 drop to eye 4 (four) times daily as needed (for dry eyes).      . predniSONE (DELTASONE) 20 MG tablet Take 80 mg by mouth daily with breakfast.      . Probiotic Product (PROBIOTIC DAILY PO) Take 1 capsule by mouth daily.       Marland Kitchen pyridOXINE (VITAMIN B-6) 100 MG tablet Take 50 mg by mouth every evening.       . tamsulosin (FLOMAX) 0.4 MG CAPS capsule Take 0.4 mg by mouth at bedtime.      . vitamin B-12 (CYANOCOBALAMIN) 1000 MCG tablet Take 1,000 mcg by mouth every evening.      . Vitamin E (VITA-PLUS E PO) Take 1 capsule by mouth daily.        Assessment: 80 yo M admitted 07/28/2014 with SOB, OHS 8/21: porcine aortic root/valve, Pericardial tissue MVR, CABG x 1, Maze pacer extraction. Post op sepsis with thrombocytopenia and ARF.  Pharmacy consulted to renally adjust antibiotics & resume anticoagulation for afib 9/14.  PMH: CAD s/p stenting 1999, HTN/HLD, WPW, PAF, PPM, arthritis, IDA, OSA, ITP, AVR/MVR, CKDIII  Coag/Heme - ITP - goal: reduce bleeding risk, PAF s/p MAZE; AVR/MVR - goal: rate/rhythm control, px TE  Plts improved; LDH elev 617, In and out of afib, now vpaced; H/H back up to 10.0 but stable, after 2u PRBC on 9/8  S/p 5 days IVIG + Nplate x 1 on 9/7, Aranesp qWed; 2/2 acute illness/h/o ITP, Reload with IV amio, coumadin on hold since 9/5 by Dr. 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 Heparin 1000 units/hr 2. Daily Heparin level, CBC 3. If remains off CVV will change Ampicillin to 2 g IV q12, continue fluconazole 400 mg IV daily given severity of illness will maintain aggressive dosing. 4. Follow up SCr, UOP, cultures, clinical course and adjust as clinically indicated.  Thank you for allowing pharmacy to be a part of this patients care team.  Rowe Robert Pharm.D., BCPS, AQ-Cardiology Clinical Pharmacist 08/18/2014 7:52 AM Pager: [phone] Phone: [phone]

## 2014-08-18 NOTE — Progress Notes (Signed)
Subjective:   CVVHD stopped yesterday. Weight up 5 lbs. Renal function trending up and 24 hr I/O 105 cc. Very weak, but ambulating in the halls with PT and CR. Denies orthopnea or CP. +DOE.  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 No obvious vegetation on TTE  Co-ox 77% of milrinone. Remains on amiodarone gtt.    Objective:   Scheduled Meds: . ampicillin (OMNIPEN) IV  2 g Intravenous 3 times per day  . antiseptic oral rinse  7 mL Mouth Rinse BID  . aspirin EC  81 mg Oral Daily  . atorvastatin  10 mg Oral QPM  . cefTRIAXone (ROCEPHIN)  IV  2 g Intravenous Q12H  . darbepoetin (ARANESP) injection - NON-DIALYSIS  100 mcg Subcutaneous Q Wed-HD  . feeding supplement (ENSURE COMPLETE)  237 mL Oral BID BM  . fluconazole (DIFLUCAN) IV  400 mg Intravenous Q24H  . folic acid  2 mg Oral Daily  . insulin aspart  0-15 Units Subcutaneous TID WC  . insulin aspart  0-5 Units Subcutaneous QHS  . ipratropium  0.5 mg Nebulization Q4H  . pantoprazole  40 mg Oral Daily  . pyridOXINE  100 mg Oral QPM  . vitamin B-12  1,000 mcg Oral QPM  . vitamin E  400 Units Oral Daily   Continuous Infusions: . amiodarone 30 mg/hr (08/18/14 0617)  . heparin 1,000 Units/hr (08/17/14 0903)   PRN Meds:.acetaminophen, heparin, levalbuterol, ondansetron (ZOFRAN) IV, ondansetron (ZOFRAN) IV, ondansetron, promethazine, traMADol, zolpidem  Filed Vitals:   08/18/14 0013 08/18/14 0339 08/18/14 0400 08/18/14 0401  BP:   144/91   Pulse:      Temp:   98.6 F (37 C)   TempSrc:   Oral   Resp:   16   Height:      Weight:  171 lb 8.3 oz (77.8 kg)    SpO2: 100%  99% 100%    Intake/Output from previous day:  Intake/Output Summary (Last 24 hours) at 08/18/14 0727 Last data filed at 08/18/14 0600  Gross per 24 hour  Intake 1416.6 ml  Output    105 ml  Net 1311.6 ml    Physical Exam:  General: Comfortable, sitting up in  bed Skin is warm and dry.  HEENT is normal.  Neck is supple. JVP 8  LIJ trialysis cath Chest clear. Pacemaker site with no hematoma Cardiovascular exam is regular rate and rhythm. 2/6 systolic murmur Abdominal exam nontender or distended. No masses palpated. Extremities show . no edema. TED hose in place. neuro grossly intact  V pacing   Lab Results: Basic Metabolic Panel:  Recent Labs  08/16/14 0515  08/17/14 0431 08/17/14 1500 08/18/14 0400  NA 131*  < > 134* 131* 130*  K 4.3  < > 4.6 5.1 5.3  CL 95*  < > 98 95* 92*  CO2 25  < > 26 25 23   GLUCOSE 82  < > 96 120* 78  BUN 22  < > 19 29* 36*  CREATININE 1.68*  < > 1.72* 2.39* 2.81*  CALCIUM 7.8*  < > 8.2* 8.0* 8.4  MG 2.3  --  2.4  --   --   PHOS 2.3  < > 2.6 2.9 4.1  < > = values in this interval not displayed. CBC:  Recent Labs  08/17/14 0431 08/18/14 0400  WBC 4.4 6.4  HGB 10.7* 10.0*  HCT 34.4* 31.8*  MCV 100.9* 99.4  PLT 87* 113*     Assessment/Plan:  1. Status post aortic root replacement, bioprosthetic aortic valve replacement, bioprosthetic mitral valve replacement, coronary artery bypass graft (to LCx system) and maze-Continue rehab. 2. Acute on chronic kidney failure-nephrology following. Significantly volume overloaded, getting CVVH. 3. Thrombocytopenia-improved. Suspect ITP.  Hematology following. 4. Paroxysmal atrial fibrillation-patient is in AV paced rhythm today. Continue amiodarone drip for now. Coumadin held for thrombocytopenia. 5. CAD-continue aspirin and statin. Had SVG-LCx system with recent cardiac surgery.  6. Status post pacemaker for CHB 7. Enterococcal endocarditis-continue antibiotics;  8. Acute diastolic CHF with prominent RV failure.  He is on milrinone gtt for RV support.  EF 50-55%-> 35-40%.  9. Acute respiratory failure 10. Fungemia/funguria 11. Pleural effusions: s/p thoracentesis on right.   PLAN/DISCUSSION   CVVHD stopped yesterday and unfortunately renal function is  trending up and he only had 105 cc of urine in 24 hrs. Concerned that his kidneys may not recover and he will need intermittent HD. Weight is up 7 lbs this am, however he only looks mildly volume overloaded.   Agree with Dr. Roxy Manns that he likely is not good TEE candidate currently and probably wouldn't change our management at this point as he is not a re-operative candidate. No obvious vegetations on TTE. Continue abx and antifungals. Appreciate IDs help.   Milrinone off now and co-ox remains good, 77% this am. SBP 130-140s, will hold off on any afterload reduction to allow kidneys BP to perfuse.   V pacing and remains on Amiodarone gtt, will discuss with Dr. Haroldine Laws about switching to PO amiodarone 200 mg BID. ICD interrogated and SR with V pacing, 12% afib.  Platelets trending up slowly. No bleeding  Junie Bame B NP-C 7:27 AM  Patient seen and examined with Junie Bame, NP. We discussed all aspects of the encounter. I agree with the assessment and plan as stated above.   Feels better. Now off CVVHD but urine output remains poor. Suspect he will need intermittent HD.   Co-ox ok off milrinone. Pacer interrogated with help from EP NP. He is in SR with PACs and AF 12% of time. Can stop IV amio.   Abx and antifungals per ID.  Needs aggressive rehab.   Breckyn Troyer,MD 9:32 AM

## 2014-08-18 NOTE — Progress Notes (Signed)
His platelet count is doing better. Today, his platelet count is 113,000.  Of note, he has candida in his blood. I much or how that got there. Unfortunately, I this may have been the reason that his platelet count went down. Again, is hard to know if he has a component of immune-based thrombocytopenia.  He probably did not get dialysis yesterday. His creatinine is up to 2.81.  His hemoglobin is 10.0. There is no bleeding.  He's really not getting out of bed all that much. I much or how much physical therapy he is getting.  Of note, he is on IV Diflucan for the Candida in his blood. I would think that he is going to need a few weeks of this given that he has a artificial valve. He also has a pacemaker.  Looks like he will finish up his enterococcal therapy on September 17.  He is somewhat hypotensive. He has a good and steady heart rate. He is afebrile.  Again, the platelet count is slowly coming up. He did get a dose of Nplate. He did get some IVIG.  I believe that the "history repeat itself". With the enterococcal bacteremia, he has a profound thrombocytopenia. I think that with the Candida in his blood, he also developed recurrent thrombocytopenia and this is improving.  I do not see any indication for Nplate or other intervention a right now.  Lum Keas

## 2014-08-18 NOTE — Progress Notes (Signed)
Subjective:   Off CRRT for 24 hours Weight up 2 kg?? Not sure why as obligatory intake not that high No urine output  Objective Vital signs in last 24 hours: Filed Vitals:   08/18/14 0339 08/18/14 0400 08/18/14 0401 08/18/14 0800  BP:  144/91  137/65  Pulse:      Temp:  98.6 F (37 C)  98.2 F (36.8 C)  TempSrc:  Oral  Oral  Resp:  16  19  Height:      Weight: 77.8 kg (171 lb 8.3 oz)     SpO2:  99% 100% 100%   Weight change: 2.5 kg (5 lb 8.2 oz)  Intake/Output Summary (Last 24 hours) at 08/18/14 1101 Last data filed at 08/18/14 1000  Gross per 24 hour  Intake 1417.4 ml  Output     60 ml  Net 1357.4 ml   Weight Trending 08/18/14 0339 77.8 kg (171 lb 8.3 oz)  (79.1 on standup scales) 08/17/14 0500 75.3 kg (166 lb 0.1 oz)   CRRT discontinued 08/16/14 0500 79.3 kg (174 lb 13.2 oz) 08/15/14 0446 84.3 kg (185 lb 13.6 oz) 08/14/14 0255 89.4 kg (197 lb 1.5 oz)  08/13/14 0500 89.2 kg (196 lb 10.4 oz) 08/12/14 0413 100.2 kg (220 lb 14.4 oz) (CRRT initiated 9/8)  Physical Exam: General: Very nice thin WM NAD Up in the chair Wife in room with him Heart: RRR Lungs: Coarse rhonchi Some crackles r>L Abdomen: non-distended, non-tender Extremities: 1+ pitting edema pre-tibial Access- Vas cath (placed 9/11) in left IJ position  Labs: Basic Metabolic Panel:  Recent Labs Lab 08/16/14 0515 08/16/14 1600 08/17/14 0431  NA 131* 133* 134*  K 4.3 4.9 4.6  CL 95* 97 98  CO2 25 27 26   GLUCOSE 82 150* 96  BUN 22 21 19   CREATININE 1.68* 1.57* 1.72*  CALCIUM 7.8* 7.9* 8.2*  PHOS 2.3 2.1* 2.6     Recent Labs Lab 08/12/14 1600  08/15/14 0444  08/16/14 0515 08/16/14 1600 08/17/14 0431  AST  --   --  57*  --   --   --   --   ALT  --   --  25  --   --   --   --   ALKPHOS  --   --  117  --   --   --   --   BILITOT  --   --  0.5  --   --   --   --   PROT 7.4  --  8.3  --   --   --   --   ALBUMIN 2.2*  < > 2.0*  < > 1.9* 1.9* 2.0*   Recent Labs Lab 08/13/14 0410  08/14/14 0645 08/15/14 0444 08/16/14 0515 08/17/14 0431  WBC 6.3 5.9 4.8 4.7 4.4  NEUTROABS  --  4.5 3.5  --   --   HGB 9.4* 9.4* 9.1* 9.7* 10.7*  HCT 30.0* 30.3* 29.3* 31.2* 34.4*  MCV 100.0 101.7* 102.8* 102.6* 100.9*  PLT 64* 65* 71* 78* 87*    Recent Labs Lab 08/16/14 0748 08/16/14 1123 08/16/14 1630 08/16/14 2136 08/17/14 0732  GLUCAP 89 160* 149* 141* 93   Studies/Results: Dg Chest Port 1 View  08/17/2014   CLINICAL DATA:  Follow-up of respiratory failure ; shortness of breath  EXAM: PORTABLE CHEST - 1 VIEW  COMPARISON:  Portable chest x-ray of August 15, 2014  FINDINGS: The lungs are adequately inflated. There are small bilateral pleural effusions  greater on the right than on the left. There is no alveolar infiltrate. The cardiopericardial silhouette is top-normal in size. A valve ring is in place in the mitral position. A left atrial appendage clip is present. The patient has undergone previous CABG. A left internal jugular venous catheter tip lies in the proximal SVC and is stable. The permanent pacemaker is unchanged in position. The observed portions of the bony thorax are unremarkable.  IMPRESSION: There has been mild interval improvement in the appearance of the pulmonary interstitium suggesting resolving pulmonary edema. There remain small bilateral pleural effusions greater on the right than on the left.   Electronically Signed   By: David  Martinique   On: 08/17/2014 07:16   Medications: . heparin 1,000 Units/hr (08/18/14 7846)    Scheduled Medications: . ampicillin (OMNIPEN) IV  2 g Intravenous 3 times per day  . antiseptic oral rinse  7 mL Mouth Rinse BID  . aspirin EC  81 mg Oral Daily  . atorvastatin  10 mg Oral QPM  . cefTRIAXone (ROCEPHIN)  IV  2 g Intravenous Q12H  . darbepoetin (ARANESP) injection - NON-DIALYSIS  100 mcg Subcutaneous Q Wed-HD  . feeding supplement (ENSURE COMPLETE)  237 mL Oral BID BM  . fluconazole (DIFLUCAN) IV  400 mg Intravenous Q24H   . folic acid  2 mg Oral Daily  . insulin aspart  0-15 Units Subcutaneous TID WC  . insulin aspart  0-5 Units Subcutaneous QHS  . pantoprazole  40 mg Oral Daily  . pyridOXINE  100 mg Oral QPM  . vitamin B-12  1,000 mcg Oral QPM  . vitamin E  400 Units Oral Daily   Background Pt is a 80 y.o. yo male who was admitted on 07/28/2014 with A on CRF (baseline around 1.5) s/p complicated cardiac surgery with complications - now fungemia  Plan Renal- CRRT  9/8 - 9/14 with reduction in weight by around 25 kg.  Held as of 9/14.  Minimal UOP and no evidence for return of function.. Labs are fine. I would like to see CXR (looked better as of yesterday) and if stable, plan a trial of intermittent hemodialysis to start tomorrow 9/16. If he does not tolerate may have to go back to CRRT  HTN/vol- massive volume overload responded well to CRRT and he is below his preop weight now. Todays weight inconsistent.  Anemia- gave aranesp- s/p transfusion- stable Cardiac -s/p Ao root replacement, AoVR, MVR, CABG and MAZE procedure/PAF - per cards and TCTS. Back on IV heparin Thrombocytopenia- improved. ID- original enterococcus veg/enterococcal endocarditis and subsequent fungemia (Candida parapsiliosis) plus funguria (not speciated) -  all lines changed out including HD cath - on amp/rocephin/fluconazole  Jamal Maes, MD [facility] [phone] Pager 08/18/2014, 11:01 AM

## 2014-08-18 NOTE — Progress Notes (Signed)
PULMONARY / CRITICAL CARE MEDICINE   Name: Justin [name] MRN: [id number] DOB: 05-24-1933    ADMISSION DATE:  07/13/2014 CONSULTATION DATE:  08/11/14  REFERRING MD :  Dr. Haroldine Laws   CHIEF COMPLAINT:  Volume overload, respiratory failure.   INITIAL PRESENTATION: 80 y/o M initially admitted 8/3 with dyspnea, fever.  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  Had CABG with AVR/MVR.  Developed acute respiratory failure and renal failure with hypervolemia.  PCCM consulted to assist with management of respiratory failure.  STUDIES:  8/03  CTA Chest >> no PE, bilateral effusions, extensive GGO, nodular opacities (largest 45mm) 8/04  ECHO >> EF 50-55%, Grade 2 DD, mod AI, mod-severe MR/TR, severe pulmonary HTN PA peak 65 8/06  CT ABD/Pelvis >> no source for bacteremia, anasarca, mod-large pleural effusions 8/06  CT Head >> negative for acute abnormality 8/11  TTE >> nml LV fxn, severe MR, large aortic valve vegetation w mod AI, no pacemaker vegetation identified 8/12  HIT >> neg  9/02  ECHO >> EF 50-55%, no wma, bioprosthesis of AV/MV, mod TR, PA peak 44  9/11 echo>>> EF 35-40%, mod reduced systolic function, PA press 104mmHg, worsened akinesis apex , no obvious veg   SIGNIFICANT EVENTS: 8/03  Admit for sepsis 8/11  Removal of Medtronic dual-chamber pacemaker secondary to enterococcal endoaortitis  8/14  LHC >> heavily calcified ostial circumflex with 70% stenosis, otherwise no critical stenosis 8/21  AVR, Aortic Root replacement, MVR, CABG x1, MAZE procedure per Dr. Roxy Manns 8/22  Extubation 8/28  Implantation of Medtronic dual-chamber pacemaker for symptomatic bradycardia / CHB 9/08  Worsening respiratory status, desaturations, tx to ICU, CVVHD started 9/9 improved after thoracentesis and neg balance on cvvhd 9/10- all lines removed, pos yeast in  Blood 9/11- new HD cath 9/12-14 CVVHD until filter clotted 9/14 AM  Subjective:  Off CVVHD, poor UOP, following commands and  rehab.  VITAL SIGNS: Temp:  [97.9 F (36.6 C)-98.6 F (37 C)] 98.2 F (36.8 C) (09/15 0800) Resp:  [16-25] 19 (09/15 0800) BP: (112-148)/(52-91) 137/65 mmHg (09/15 0800) SpO2:  [89 %-100 %] 100 % (09/15 0800) Weight:  [77.8 kg (171 lb 8.3 oz)] 77.8 kg (171 lb 8.3 oz) (09/15 0339)  HEMODYNAMICS:   VENTILATOR SETTINGS:   INTAKE / OUTPUT:  Intake/Output Summary (Last 24 hours) at 08/18/14 0940 Last data filed at 08/18/14 0800  Gross per 24 hour  Intake 1073.6 ml  Output    105 ml  Net  968.6 ml   PHYSICAL EXAMINATION:  Gen: sitting up in bed, no acute distress HEENT: NCAT, EOMi, PULM: few crackles in bases, otherwise clear CV: Irreg irreg, systolic murmur AB: BS+, soft, nontender Ext: warm, no leg edema, SCDs, no clubbing, no cyanosis Derm: no rash or skin breakdown Neuro: A&Ox4, MAEW  LABS:  CBC  Recent Labs Lab 08/16/14 0515 08/17/14 0431 08/18/14 0400  WBC 4.7 4.4 6.4  HGB 9.7* 10.7* 10.0*  HCT 31.2* 34.4* 31.8*  PLT 78* 87* 113*   Coag's  Recent Labs Lab 08/12/14 0410 08/13/14 0410  08/16/14 0515 08/17/14 0431 08/18/14 0400  APTT 45* 46*  --   --   --   --   INR 1.50* 1.42  < > 1.31 1.26 1.30  < > = values in this interval not displayed. BMET  Recent Labs Lab 08/17/14 0431 08/17/14 1500 08/18/14 0400  NA 134* 131* 130*  K 4.6 5.1 5.3  CL 98 95* 92*  CO2 26 25 23   BUN  19 29* 36*  CREATININE 1.72* 2.39* 2.81*  GLUCOSE 96 120* 78   Electrolytes  Recent Labs Lab 08/15/14 0444  08/16/14 0515  08/17/14 0431 08/17/14 1500 08/18/14 0400  CALCIUM 8.0*  < > 7.8*  < > 8.2* 8.0* 8.4  MG 2.3  --  2.3  --  2.4  --   --   PHOS  --   < > 2.3  < > 2.6 2.9 4.1  < > = values in this interval not displayed.  ABG No results found for this basename: PHART, PCO2ART, PO2ART,  in the last 168 hours  Liver Enzymes  Recent Labs Lab 08/15/14 0444  08/17/14 0431 08/17/14 1500 08/18/14 0400  AST 57*  --   --   --   --   ALT 25  --   --   --    --   ALKPHOS 117  --   --   --   --   BILITOT 0.5  --   --   --   --   ALBUMIN 2.0*  < > 2.0* 2.0* 2.0*  < > = values in this interval not displayed. Cardiac Enzymes No results found for this basename: TROPONINI, PROBNP,  in the last 168 hours Glucose  Recent Labs Lab 08/16/14 2136 08/17/14 0732 08/17/14 1104 08/17/14 1644 08/17/14 2053 08/18/14 0750  GLUCAP 141* 93 157* 153* 74 73   Imaging 9/14 CXR > improved interstitial edema, persistent small effusions R>L  ASSESSMENT / PLAN:  PULMONARY A: Acute Hypoxic Respiratory Failure - in setting of volume overload / CHF / b/l pleural effusions > resolved Bilateral pleural effusions > both tapped, felt to be due to volume overload Hx of OSA on CPAP as outpt. P:   Monitor CXR for effusions Would like to see more volume negative CPAP nocturnal  Out of bed and PT/OT.  CARDIOVASCULAR PICC 8/28 >> 9/10 HD 9/8>>>9/10 HD L IJ 9/11 >> A:  CAD s/p CABG x1 with MVR/AVR - 8/21 per Dr. Roxy Manns.  PAF - controlled, not on anticoagulation. CHB s/p Pacemaker - 8/28 per Dr. Lovena Le. P:  Amiodarone, ASA, Lipitor per cardiology, will defer to cards as to when to change to PO amiodarone. Milrinone gtts per CHF SVC, now off. Map goal 60. No tee planned - not re-operative candidate, no obvious veg on TTE  RENAL A:   Acute Kidney failure Mild Hyponatremia  P:   Per renal - ?need for HD. F/u chem.  GASTROINTESTINAL A:   Protein Calorie Malnutrition - in setting of critical illness. P:   Diet and advancement as tolerated. PPI for SUP.  HEMATOLOGIC A:   Anemia of critical illness. Thrombocytopenia - fungemia driven , ITP? (unlikely, No antiplatelet Ab sent) P:  Treat multiple medical problems. CBC daily. Cont hold off on sq heparin - monitor plt. SCD for DVT prophylaxis.  INFECTIOUS A:   Enterococcal Endocarditis. Fungemia, r/o picc vs urine as source, valves at risk UTI, foley related, yeast as well P:   UC 9/8 >>yeast 65  k  Blood 9/8 >>>yeast>>>candida parapsilosis Pleural rt 9/9>>>neg  afb neg Pleural 9/10 left>>>neg BC 9/11>>>  Ampicillin, start date 8/08>>> Rocephin, start date 8/23>>> Diflucan 9/10>>>9/10 >>>9/12>>> Caspofungin 9/10>>>9/12  F/u culture data  Per ID  ENDOCRINE A:   Hyperglycemia - in setting of critical illness + steroid administration  P:   SSI   NEUROLOGIC A:   TIA 8/06 >> resolved Mild delirium > resolved P: Mobilize as able  TODAY'S  SUMMARY: Overall improving, out of bed, walking around.  May transfer to SDU when primary service is ready.  PCCM will sign off, please call back if needed.  Rush Farmer, M.D. [facility] [place]. Pager: [phone]. After hours pager: [phone].

## 2014-08-18 NOTE — Progress Notes (Addendum)
TCTS DAILY ICU PROGRESS NOTE                   [address]            [place] [address]          [phone]   18 Days Post-Op Procedure(s) (LRB): PERMANENT PACEMAKER INSERTION (N/A)  Total Length of Stay:  LOS: 43 days   Subjective: "I'm feeling better and I want to walk more today."  Still gets dyspneic with minimal exertion. No new complaints.   Objective: Vital signs in last 24 hours: Temp:  [97.9 F (36.6 C)-98.6 F (37 C)] 98.2 F (36.8 C) (09/15 0800) Cardiac Rhythm:  [-] Ventricular paced (09/15 0800) Resp:  [16-25] 19 (09/15 0800) BP: (112-148)/(52-91) 137/65 mmHg (09/15 0800) SpO2:  [89 %-100 %] 100 % (09/15 0800) Weight:  [171 lb 8.3 oz (77.8 kg)] 171 lb 8.3 oz (77.8 kg) (09/15 0339)  Filed Weights   08/16/14 0500 08/17/14 0500 08/18/14 0339  Weight: 174 lb 13.2 oz (79.3 kg) 166 lb 0.1 oz (75.3 kg) 171 lb 8.3 oz (77.8 kg)    Weight change: 5 lb 8.2 oz (2.5 kg)   Hemodynamic parameters for last 24 hours:    Intake/Output from previous day: 09/14 0701 - 09/15 0700 In: 1443.3 [P.O.:720; I.V.:623.3; IV Piggyback:100] Out: 105 [Urine:105]  Intake/Output this shift: Total I/O In: 26.7 [I.V.:26.7] Out: -   Current Meds: Scheduled Meds: . ampicillin (OMNIPEN) IV  2 g Intravenous 3 times per day  . antiseptic oral rinse  7 mL Mouth Rinse BID  . aspirin EC  81 mg Oral Daily  . atorvastatin  10 mg Oral QPM  . cefTRIAXone (ROCEPHIN)  IV  2 g Intravenous Q12H  . darbepoetin (ARANESP) injection - NON-DIALYSIS  100 mcg Subcutaneous Q Wed-HD  . feeding supplement (ENSURE COMPLETE)  237 mL Oral BID BM  . fluconazole (DIFLUCAN) IV  400 mg Intravenous Q24H  . folic acid  2 mg Oral Daily  . insulin aspart  0-15 Units Subcutaneous TID WC  . insulin aspart  0-5 Units Subcutaneous QHS  . ipratropium  0.5 mg Nebulization Q4H  . pantoprazole  40 mg Oral Daily  . pyridOXINE  100 mg Oral QPM  . vitamin B-12  1,000 mcg Oral QPM  . vitamin E  400  Units Oral Daily   Continuous Infusions: . amiodarone 30 mg/hr (08/18/14 0617)  . heparin 1,000 Units/hr (08/17/14 0903)   PRN Meds:.acetaminophen, heparin, levalbuterol, ondansetron (ZOFRAN) IV, ondansetron (ZOFRAN) IV, ondansetron, promethazine, traMADol, zolpidem  Physical Exam: General appearance: alert, cooperative and no distress Heart: RRR, paced Lungs: Diminished BS in bases Extremities: No LE edema Wound: Clean and dry    Lab Results: CBC: Recent Labs  08/17/14 0431 08/18/14 0400  WBC 4.4 6.4  HGB 10.7* 10.0*  HCT 34.4* 31.8*  PLT 87* 113*   BMET:  Recent Labs  08/17/14 1500 08/18/14 0400  NA 131* 130*  K 5.1 5.3  CL 95* 92*  CO2 25 23  GLUCOSE 120* 78  BUN 29* 36*  CREATININE 2.39* 2.81*  CALCIUM 8.0* 8.4    PT/INR:  Recent Labs  08/18/14 0400  LABPROT 16.2*  INR 1.30   Radiology: Dg Chest Port 1 View  08/17/2014   CLINICAL DATA:  Follow-up of respiratory failure ; shortness of breath  EXAM: PORTABLE CHEST - 1 VIEW  COMPARISON:  Portable chest x-ray of August 15, 2014  FINDINGS: The lungs are  adequately inflated. There are small bilateral pleural effusions greater on the right than on the left. There is no alveolar infiltrate. The cardiopericardial silhouette is top-normal in size. A valve ring is in place in the mitral position. A left atrial appendage clip is present. The patient has undergone previous CABG. A left internal jugular venous catheter tip lies in the proximal SVC and is stable. The permanent pacemaker is unchanged in position. The observed portions of the bony thorax are unremarkable.  IMPRESSION: There has been mild interval improvement in the appearance of the pulmonary interstitium suggesting resolving pulmonary edema. There remain small bilateral pleural effusions greater on the right than on the left.   Electronically Signed   By: David  Martinique   On: 08/17/2014 07:16     Assessment/Plan: S/P Procedure(s) (LRB): PERMANENT  PACEMAKER INSERTION (N/A) CV- Acute diastolic CHF with RV failure, off Milrinone. Hopefully to switch Amio to po soon.   AHF team managing. ID- Candida parapsilosis fungemia, on Ceftriaxone/Fluconozole. ID following. WBC stable.  Acute/chronic renal failure-  Renal following.  Cr up slightly this am. UOP 105 ml/past 24 hours. Off CRRT for now. Thrombocytopenia- plts slowly trending up. Continue to watch.  Acute resp failure- pulm status stable, improving. Sats stable on 2 L O2. Continue current care. Deconditioning- CRPI, [place] H 08/18/2014 8:57 AM   I have seen and examined the patient and agree with the assessment and plan as outlined.  Overall looks considerably better although he remains oliguric.  Resume CVVHD or try conventional HD today?  Platelet count continues to rise despite anticoagulation w/ heparin - profound thrombocytopenia last week likely related to sepsis.  Resp status stable - will check CXR tomorrow to f/u effusions.  Maintaining AV paced rhythm for the most part at present.  Could potentially convert amiodarone to oral - will defer to Cardiology - Advanced Heart Failure team.  Extremely weak and deconditioned.  Continue PT and nutritional support.  Horris Speros H 08/18/2014 9:03 AM

## 2014-08-19 ENCOUNTER — Inpatient Hospital Stay (HOSPITAL_COMMUNITY): Payer: Medicare Other

## 2014-08-19 LAB — PROTIME-INR
INR: 1.28 (ref 0.00–1.49)
PROTHROMBIN TIME: 16 s — AB (ref 11.6–15.2)

## 2014-08-19 LAB — RENAL FUNCTION PANEL
ALBUMIN: 1.9 g/dL — AB (ref 3.5–5.2)
ANION GAP: 14 (ref 5–15)
BUN: 49 mg/dL — ABNORMAL HIGH (ref 6–23)
CHLORIDE: 93 meq/L — AB (ref 96–112)
CO2: 24 mEq/L (ref 19–32)
Calcium: 8.3 mg/dL — ABNORMAL LOW (ref 8.4–10.5)
Creatinine, Ser: 3.06 mg/dL — ABNORMAL HIGH (ref 0.50–1.35)
GFR calc non Af Amer: 18 mL/min — ABNORMAL LOW (ref >90)
GFR, EST AFRICAN AMERICAN: 21 mL/min — AB (ref >90)
Glucose, Bld: 73 mg/dL (ref 70–99)
POTASSIUM: 4.9 meq/L (ref 3.7–5.3)
Phosphorus: 4.7 mg/dL — ABNORMAL HIGH (ref 2.3–4.6)
SODIUM: 131 meq/L — AB (ref 137–147)

## 2014-08-19 LAB — GLUCOSE, CAPILLARY
GLUCOSE-CAPILLARY: 109 mg/dL — AB (ref 70–99)
Glucose-Capillary: 126 mg/dL — ABNORMAL HIGH (ref 70–99)
Glucose-Capillary: 113 mg/dL — ABNORMAL HIGH (ref 70–99)
Glucose-Capillary: 106 mg/dL — ABNORMAL HIGH (ref 70–99)
Glucose-Capillary: 156 mg/dL — ABNORMAL HIGH (ref 70–99)

## 2014-08-19 LAB — RETICULOCYTES
RBC.: 3.12 MIL/uL — ABNORMAL LOW (ref 4.22–5.81)
Retic Count, Absolute: 131 10*3/uL (ref 19.0–186.0)
Retic Ct Pct: 4.2 % — ABNORMAL HIGH (ref 0.4–3.1)

## 2014-08-19 LAB — CBC
HEMATOCRIT: 30.7 % — AB (ref 39.0–52.0)
HEMATOCRIT: 29.6 % — AB (ref 39.0–52.0)
HEMOGLOBIN: 9.7 g/dL — AB (ref 13.0–17.0)
Hemoglobin: 9.5 g/dL — ABNORMAL LOW (ref 13.0–17.0)
MCH: 31.1 pg (ref 26.0–34.0)
MCH: 31.6 pg (ref 26.0–34.0)
MCHC: 31.6 g/dL (ref 30.0–36.0)
MCHC: 32.1 g/dL (ref 30.0–36.0)
MCV: 98.4 fL (ref 78.0–100.0)
MCV: 98.3 fL (ref 78.0–100.0)
Platelets: 125 10*3/uL — ABNORMAL LOW (ref 150–400)
Platelets: 132 10*3/uL — ABNORMAL LOW (ref 150–400)
RBC: 3.12 MIL/uL — AB (ref 4.22–5.81)
RBC: 3.01 MIL/uL — ABNORMAL LOW (ref 4.22–5.81)
RDW: 22.1 % — ABNORMAL HIGH (ref 11.5–15.5)
RDW: 22.4 % — AB (ref 11.5–15.5)
WBC: 5.7 10*3/uL (ref 4.0–10.5)
WBC: 4.8 10*3/uL (ref 4.0–10.5)

## 2014-08-19 LAB — ANA, BODY FLUID: ANTI-NUCLEAR AB, IGG: NOT DETECTED

## 2014-08-19 LAB — LACTATE DEHYDROGENASE: LDH: 656 U/L — ABNORMAL HIGH (ref 94–250)

## 2014-08-19 LAB — CARBOXYHEMOGLOBIN
Carboxyhemoglobin: 1.9 % — ABNORMAL HIGH (ref 0.5–1.5)
METHEMOGLOBIN: 0.6 % (ref 0.0–1.5)
O2 Saturation: 62.9 %
Total hemoglobin: 12 g/dL — ABNORMAL LOW (ref 13.5–18.0)

## 2014-08-19 LAB — HEPARIN LEVEL (UNFRACTIONATED): HEPARIN UNFRACTIONATED: 0.47 [IU]/mL (ref 0.30–0.70)

## 2014-08-19 LAB — HEPATITIS B SURFACE ANTIBODY,QUALITATIVE: Hep B S Ab: POSITIVE — AB

## 2014-08-19 MED ORDER — DARBEPOETIN ALFA-POLYSORBATE 100 MCG/0.5ML IJ SOLN
100.0000 ug | INTRAMUSCULAR | Status: DC
  Administered 2014-08-21 – 2014-08-28 (×2): 100 ug via SUBCUTANEOUS
  Filled 2014-08-19 (×3): qty 0.5

## 2014-08-19 NOTE — Progress Notes (Signed)
Subjective:   Off CRRT for 48 hours MAY be making a little more urine  345 recorded Creatinine continues to rise in interdialytic interval Dyspneic with exertion (moving around in the bed) Weight variable but up some  Objective Vital signs in last 24 hours: Filed Vitals:   08/19/14 0400 08/19/14 0415 08/19/14 0500 08/19/14 0600  BP: 150/64 150/64  122/50  Pulse:  76    Temp:  97.9 F (36.6 C)    TempSrc:  Oral    Resp: 21 18  26   Height:      Weight:   79.7 kg (175 lb 11.3 oz)   SpO2: 100% 100%  100%   Weight change: 1.3 kg (2 lb 13.9 oz)  Intake/Output Summary (Last 24 hours) at 08/19/14 0740 Last data filed at 08/19/14 0700  Gross per 24 hour  Intake 1753.4 ml  Output    345 ml  Net 1408.4 ml   Weight Trending 08/19/14 0500 79.7 kg (175 lb 11.3 oz) 08/18/14 0339 77.8 kg (171 lb 8.3 oz)  (79.1 on standup scales) 08/17/14 0500 75.3 kg (166 lb 0.1 oz)   CRRT discontinued 08/16/14 0500 79.3 kg (174 lb 13.2 oz) 08/15/14 0446 84.3 kg (185 lb 13.6 oz) 08/14/14 0255 89.4 kg (197 lb 1.5 oz)  08/13/14 0500 89.2 kg (196 lb 10.4 oz) 08/12/14 0413 100.2 kg (220 lb 14.4 oz) (CRRT initiated 9/8)  Physical Exam: General: Very nice thin WM NAD  Seems somewhat dyspneic Heart:S1S2 No def s3  Lungs: Coarse rhonchi Bilat crackles Abdomen: non-distended, non-tender Extremities: 1+ pitting edema pre-tibial Access- Vas cath (placed 9/11) in left IJ position  Labs: Basic Metabolic Panel: Basic Metabolic Panel:  Recent Labs  08/17/14 0431  08/18/14 1540 08/19/14 0435  NA 134*  < > 131* 131*  K 4.6  < > 5.0 4.9  CL 98  < > 93* 93*  CO2 26  < > 23 24  GLUCOSE 96  < > 110* 73  BUN 19  < > 43* 49*  CREATININE 1.72*  < > 3.09* 3.06*  CALCIUM 8.2*  < > 8.4 8.3*  MG 2.4  --   --   --   PHOS 2.6  < > 5.0* 4.7*  < > = values in this interval not displayed. Liver Function Tests:  Recent Labs  08/18/14 1540 08/19/14 0435  ALBUMIN 2.0* 1.9*   CBC:  Recent Labs   08/18/14 0400 08/19/14 0435  WBC 6.4 5.7  HGB 10.0* 9.7*  HCT 31.8* 30.7*  MCV 99.4 98.4  PLT 113* 125*   Anemia Panel:  Recent Labs  08/19/14 0435  RETICCTPCT 4.2*   Studies/Results: Dg Chest Port 1 View  08/17/2014   CLINICAL DATA:  Follow-up of respiratory failure ; shortness of breath  EXAM: PORTABLE CHEST - 1 VIEW  COMPARISON:  Portable chest x-ray of August 15, 2014  FINDINGS: The lungs are adequately inflated. There are small bilateral pleural effusions greater on the right than on the left. There is no alveolar infiltrate. The cardiopericardial silhouette is top-normal in size. A valve ring is in place in the mitral position. A left atrial appendage clip is present. The patient has undergone previous CABG. A left internal jugular venous catheter tip lies in the proximal SVC and is stable. The permanent pacemaker is unchanged in position. The observed portions of the bony thorax are unremarkable.  IMPRESSION: There has been mild interval improvement in the appearance of the pulmonary interstitium suggesting resolving pulmonary edema. There  remain small bilateral pleural effusions greater on the right than on the left.   Electronically Signed   By: David  Martinique     9/16 There is slight interval increase in the conspicuity of the  bilateral pleural effusions. This may be at least in part due to the  patient's semi erect positioning today as compared to yesterday's  upright positioning. There mild pulmonary interstitial edema  persists especially inferiorly.  Medications: . heparin 1,000 Units/hr (08/19/14 0700)    Scheduled Medications: . ampicillin (OMNIPEN) IV  2 g Intravenous 3 times per day  . antiseptic oral rinse  7 mL Mouth Rinse BID  . aspirin EC  81 mg Oral Daily  . atorvastatin  10 mg Oral QPM  . cefTRIAXone (ROCEPHIN)  IV  2 g Intravenous Q12H  . darbepoetin (ARANESP) injection - NON-DIALYSIS  100 mcg Subcutaneous Q Wed-1800  . feeding supplement (ENSURE  COMPLETE)  237 mL Oral BID BM  . fluconazole (DIFLUCAN) IV  400 mg Intravenous Q24H  . folic acid  2 mg Oral Daily  . insulin aspart  0-15 Units Subcutaneous TID WC  . insulin aspart  0-5 Units Subcutaneous QHS  . pantoprazole  40 mg Oral Daily  . pyridOXINE  100 mg Oral QPM  . vitamin B-12  1,000 mcg Oral QPM  . vitamin E  400 Units Oral Daily   Background Pt is a 80 y.o. yo male who was admitted on 07/16/2014 with A on CRF (baseline around 1.5) s/p complicated cardiac surgery with complications - now fungemia  Plan AKI on CKD d/t ATN -  CRRT  9/8 - 9/14 with reduction in weight by around 25 kg (was massively overloaded).  Held as of 9/14.  Slight increase in UOP. Vol up some. CXR with persistent edema and effusions. BP stable enough (I think) for trial of intermittent HD planned for today which hopefully he will tolerate. Current temp cath placed 9/11. If we are not seeing any appreciable evidence for return of function by first of next week should probably proceed with tunnelled catheter.  Anemia- Aranesp 100 QWed. Also s/p transfusion- stable Cardiac -s/p Ao root replacement, AoVR, MVR, CABG and MAZE procedure/PAF - per cards and TCTS. Back on IV heparin Thrombocytopenia- improving ID- original enterococcus veg/enterococcal endocarditis and subsequent fungemia (Candida parapsiliosis) plus funguria (not speciated) -  all lines changed out including HD cath - on amp/rocephin/fluconazole with ID following.  Jamal Maes, MD [facility] [phone] Pager 08/19/2014, 7:40 AM

## 2014-08-19 NOTE — Progress Notes (Signed)
ANTICOAGULATION CONSULT NOTE - Follow Up Consult  Pharmacy Consult for Heparin Indication: atrial fibrillation, recent tissue valve replacement  No Known Allergies  Patient Measurements: Height: 5\' 11"  (180.3 cm) Weight: 175 lb 11.3 oz (79.7 kg) IBW/kg (Calculated) : 75.3  Vital Signs: Temp: 98.3 F (36.8 C) (09/16 0745) Temp src: Oral (09/16 0745) BP: 131/91 mmHg (09/16 0800) Pulse Rate: 76 (09/16 0415)  Labs:  Recent Labs  08/17/14 0431  08/17/14 1800 08/18/14 0400 08/18/14 1540 08/19/14 0435  HGB 10.7*  --   --  10.0*  --  9.7*  HCT 34.4*  --   --  31.8*  --  30.7*  PLT 87*  --   --  113*  --  125*  LABPROT 15.8*  --   --  16.2*  --  16.0*  INR 1.26  --   --  1.30  --  1.28  HEPARINUNFRC  --   --  0.31 0.45  --  0.47  CREATININE 1.72*  < >  --  2.81* 3.09* 3.06*  < > = values in this interval not displayed.  Estimated Creatinine Clearance: 20.8 ml/min (by C-G formula based on Cr of 3.06).   Medical History: Past Medical History  Diagnosis Date  . CAD (coronary artery disease) prior stenting 1999   . Dyslipidemia   . HTN (hypertension)   . WPW (Wolff-Parkinson-White syndrome) loss of preexcitation   . Atrial fibrillation   . AV block, Mobitz II   . Presyncope   . Myocardial infarction   . Pacemaker 04/07/2013  . Arthritis   . History of chicken pox   . Anemia   . Hypernatremia 06/03/2013  . Thrombocytopenia, unspecified 06/03/2013  . Leukopenia 06/03/2013  . Obstructive sleep apnea 06/03/2013    USES CPAP  . Urinary incontinence 06/03/2013  . Hyperglycemia 12/21/2013  . Pancytopenia 06/03/2013  . Iron deficiency anemia, unspecified 07/02/2014  . Malabsorption of iron 07/02/2014  . Hypotestosteronemia 07/02/2014  . ITP (idiopathic thrombocytopenic purpura) 07/02/2014    possible - although patient had bacterial endocarditis at the time of diagnosis  . Aortic insufficiency   . Chronic kidney disease   . Bacterial endocarditis      Enterococcus sepsis with aortic  valve vegetation  . UTI (urinary tract infection) 08/03/2014    ENTEROBACTER CLOACAE   . S/P aortic valve and mitral valve replacement 07/06/2014    21 mm Medtronic Freestyle porcine aortic root graft 29 mm [facility] Mitral bovine bioprosthetic mitral valve   . S/P aortic valve replacement with stentless valve 07/20/2014    21 mm Medtronic Freestyle porcine aortic root graft with reimplantation of left main and right coronary arteries  . S/P mitral valve replacement with bioprosthetic valve 07/23/2014    29 mm [facility] Mitral bovine bioprosthetic tissue valve  . S/P CABG x 1 07/29/2014    SVG to OM1 with EVH via right thigh  . S/P Maze operation for atrial fibrillation 08/01/2014    Complete bilateral atrial lesion set using cryothermy and bipolar radiofrequency ablation with clipping of LA appendage  . Acute renal failure superimposed on stage 3 chronic kidney disease 07/27/2014  . Acute on chronic diastolic heart failure 12/28/5807    Medications:  Prescriptions prior to admission  Medication Sig Dispense Refill  . atorvastatin (LIPITOR) 10 MG tablet Take 1 tablet (10 mg total) by mouth every evening.  90 tablet  3  . Azelaic Acid (FINACEA) 15 % cream Apply 1 application topically daily. After skin  is thoroughly washed and patted dry, gently but thoroughly massage a thin film of azelaic acid creame      . Cholecalciferol (VITAMIN D-3) 1000 UNITS CAPS Take 1 capsule by mouth daily.      . furosemide (LASIX) 20 MG tablet Take 3 tablets (60 mg total) by mouth daily.  90 tablet  12  . losartan (COZAAR) 50 MG tablet Take 1 tablet (50 mg total) by mouth daily.  90 tablet  3  . Multiple Vitamin (MULTIVITAMIN WITH MINERALS) TABS Take 1 tablet by mouth 2 (two) times a week.       . nadolol (CORGARD) 20 MG tablet Take 10 mg by mouth daily.      . nitroGLYCERIN (NITROSTAT) 0.4 MG SL tablet Place 1 tablet (0.4 mg total) under the tongue every 5 (five) minutes as needed for chest pain. x3 doses as  needed for chest pain  25 tablet  12  . omeprazole (PRILOSEC) 20 MG capsule Take 20 mg by mouth daily as needed (for heartburn).       Justin [name]-Propyl [name] (SYSTANE ULTRA OP) Apply 1 drop to eye 4 (four) times daily as needed (for dry eyes).      . predniSONE (DELTASONE) 20 MG tablet Take 80 mg by mouth daily with breakfast.      . Probiotic Product (PROBIOTIC DAILY PO) Take 1 capsule by mouth daily.       Marland Kitchen pyridOXINE (VITAMIN B-6) 100 MG tablet Take 50 mg by mouth every evening.       . tamsulosin (FLOMAX) 0.4 MG CAPS capsule Take 0.4 mg by mouth at bedtime.      . vitamin B-12 (CYANOCOBALAMIN) 1000 MCG tablet Take 1,000 mcg by mouth every evening.      . Vitamin E (VITA-PLUS E PO) Take 1 capsule by mouth daily.       . heparin 1,000 Units/hr (08/19/14 0700)    Assessment: 80 yo M admitted 07/25/2014 with SOB, OHS 8/21: porcine aortic root/valve, Pericardial tissue MVR, CABG x 1, Maze pacer extraction. Post op sepsis with thrombocytopenia and ARF.  Pharmacy consulted to renally adjust antibiotics & resume anticoagulation for afib 9/14.  PMH: CAD s/p stenting 1999, HTN/HLD, WPW, PAF, PPM, arthritis, IDA, OSA, ITP, AVR/MVR, CKDIII  Coag/Heme - ITP - goal: reduce bleeding risk, PAF s/p MAZE; AVR/MVR - goal: rate/rhythm control, px TE  Plts improved; LDH elev 617, In and out of afib, now vpaced; H/H 9.7 but stable, after 2u PRBC on 9/8  S/p 5 days IVIG + Nplate x 1 on 9/7, Aranesp qWed; 2/2 acute illness/h/o ITP, Reload with IV amio, coumadin on hold since 9/5 by Dr. Marin Olp; started on hep gtt on 9/14, now therapeutic on 1000 units/hr.  No bleeding or complications noted.  Goal of Therapy:  Heparin level 0.3-0.7 units/ml Monitor platelets by anticoagulation protocol: Yes Renal adjustment of antibiotics   Plan:  1. Continue heparin 1000 units/hr. 2. Daily heparin level, CBC  Thank you for allowing pharmacy to be a part of this patient's care team.  Uvaldo Rising,  BCPS  Clinical Pharmacist Pager [phone]  08/19/2014 8:55 AM

## 2014-08-19 NOTE — Progress Notes (Signed)
Pt ran 36 min of a 4hr hemodialysis Tx. Tx was stopped due to mechanical problems with the machine. Will get a different machine and resume Tx later today.

## 2014-08-19 NOTE — Progress Notes (Signed)
Subjective:   CVVHD stopped (9/14). Weight up 1 lb. Renal function stable and he is making more urine, 24 hr I/O trending up and 24 hr I/O -345 cc.  Very weak, but ambulating in the halls with PT and CR. Denies orthopnea or CP. +DOE.  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 No obvious vegetation on TTE  Co-ox 63% off milrinone. Amiodarone stopped yesterday.   Objective:   Scheduled Meds: . ampicillin (OMNIPEN) IV  2 g Intravenous 3 times per day  . antiseptic oral rinse  7 mL Mouth Rinse BID  . aspirin EC  81 mg Oral Daily  . atorvastatin  10 mg Oral QPM  . cefTRIAXone (ROCEPHIN)  IV  2 g Intravenous Q12H  . darbepoetin (ARANESP) injection - NON-DIALYSIS  100 mcg Subcutaneous Q Wed-1800  . feeding supplement (ENSURE COMPLETE)  237 mL Oral BID BM  . fluconazole (DIFLUCAN) IV  400 mg Intravenous Q24H  . folic acid  2 mg Oral Daily  . insulin aspart  0-15 Units Subcutaneous TID WC  . insulin aspart  0-5 Units Subcutaneous QHS  . pantoprazole  40 mg Oral Daily  . pyridOXINE  100 mg Oral QPM  . vitamin B-12  1,000 mcg Oral QPM  . vitamin E  400 Units Oral Daily   Continuous Infusions: . heparin 1,000 Units/hr (08/18/14 0907)   PRN Meds:.acetaminophen, heparin, levalbuterol, ondansetron (ZOFRAN) IV, ondansetron (ZOFRAN) IV, ondansetron, promethazine, traMADol, zolpidem  Filed Vitals:   08/19/14 0200 08/19/14 0415 08/19/14 0500 08/19/14 0600  BP: 96/47 150/64  122/50  Pulse:  76    Temp:  97.9 F (36.6 C)    TempSrc:  Oral    Resp: 25 18  26   Height:      Weight:   175 lb 11.3 oz (79.7 kg)   SpO2: 100% 100%  100%    Intake/Output from previous day:  Intake/Output Summary (Last 24 hours) at 08/19/14 0725 Last data filed at 08/19/14 0600  Gross per 24 hour  Intake 1743.4 ml  Output    345 ml  Net 1398.4 ml    Physical Exam:  General: Comfortable, sitting up in bed Skin is  warm and dry.  HEENT is normal.  Neck is supple. JVP difficult to assess but appears 9-10; LIJ trialysis cath Chest clear. Pacemaker site with no hematoma Cardiovascular exam is regular rate and rhythm. 2/6 systolic murmur Abdominal exam nontender or distended. No masses palpated. Extremities show . no edema. TED hose in place. neuro grossly intact  V pacing   Lab Results: Basic Metabolic Panel:  Recent Labs  08/17/14 0431  08/18/14 1540 08/19/14 0435  NA 134*  < > 131* 131*  K 4.6  < > 5.0 4.9  CL 98  < > 93* 93*  CO2 26  < > 23 24  GLUCOSE 96  < > 110* 73  BUN 19  < > 43* 49*  CREATININE 1.72*  < > 3.09* 3.06*  CALCIUM 8.2*  < > 8.4 8.3*  MG 2.4  --   --   --   PHOS 2.6  < > 5.0* 4.7*  < > = values in this interval not displayed. CBC:  Recent Labs  08/18/14 0400 08/19/14 0435  WBC 6.4 5.7  HGB 10.0* 9.7*  HCT 31.8* 30.7*  MCV 99.4 98.4  PLT 113* 125*     Assessment/Plan:  1. Status post aortic  root replacement, bioprosthetic aortic valve replacement, bioprosthetic mitral valve replacement, coronary artery bypass graft (to LCx system) and maze-Continue rehab. 2. Acute on chronic kidney failure-nephrology following. Significantly volume overloaded, getting CVVH. 3. Thrombocytopenia-improved. Suspect ITP.  Hematology following. 4. Paroxysmal atrial fibrillation-patient is in AV paced rhythm today. Continue amiodarone drip for now. Coumadin held for thrombocytopenia. 5. CAD-continue aspirin and statin. Had SVG-LCx system with recent cardiac surgery.  6. Status post pacemaker for CHB 7. Enterococcal endocarditis-continue antibiotics;  8. Acute diastolic CHF with prominent RV failure.  He is on milrinone gtt for RV support.  EF 50-55%-> 35-40%.  9. Acute respiratory failure 10. Fungemia/funguria 11. Pleural effusions: s/p thoracentesis on right.   PLAN/DISCUSSION   CVVHD stopped (9/14) and renal function maybe plateauing. He has had increased UOP in the last 24  hrs, 395 cc and weight is up 1 lb. Still major concern here is if his kidneys will recover. Plan is for him to undergo HD today.   Agree with Dr. Roxy Manns that he likely is not good TEE candidate currently and probably wouldn't change our management at this point as he is not a re-operative candidate. No obvious vegetations on TTE. Continue abx and antifungals. Appreciate IDs help.   Co-ox stable off milrinone. SBP 130-150s, and will hold off on any afterload reduction to allow kidneys BP to perfuse.   ICD interrogated yesterday and V pacing with SR majority of the time with about 12% of afib. He is now off amiodarone. Will continue to follow.   Platelets trending up slowly. No bleeding  Rande Brunt NP-C 7:25 AM  Patient seen and examined with Junie Bame, NP. We discussed all aspects of the encounter. I agree with the assessment and plan as stated above.   Stable from respiratory status. Weight trending back up. Still waiting on kidneys to recover. For HD today.   Daniel Bensimhon,MD 8:04 AM

## 2014-08-19 NOTE — Progress Notes (Signed)
PT Cancellation Note  Patient Details Name: Justin [name] MRN: [id number] DOB: Feb 09, 1933   Cancelled Treatment:    Reason Eval/Treat Not Completed: Other (comment). Pt with increased fatigue today. Planned for continued dialysis this afternoon due to mechanical problems with machine this morning. Will re-attempt to see pt at next available time.    Elie Confer [place], [place] 08/19/2014, 2:12 PM

## 2014-08-19 NOTE — Progress Notes (Addendum)
[facility] DAILY ICU PROGRESS NOTE                   [address]            [place] [address]          [phone]   19 Days Post-Op Procedure(s) (LRB): PERMANENT PACEMAKER INSERTION (N/A)  Total Length of Stay:  LOS: 44 days   Subjective: A little more dyspneic with exertion today.  Appetite still marginal, but he is trying to eat.   Objective: Vital signs in last 24 hours: Temp:  [97.9 F (36.6 C)-98.6 F (37 C)] 98.3 F (36.8 C) (09/16 0745) Pulse Rate:  [60-76] 76 (09/16 0415) Cardiac Rhythm:  [-] Ventricular paced (09/16 0800) Resp:  [18-28] 19 (09/16 0800) BP: (86-162)/(47-91) 131/91 mmHg (09/16 0800) SpO2:  [93 %-100 %] 96 % (09/16 0800) Weight:  [174 lb 6.1 oz (79.1 kg)-175 lb 11.3 oz (79.7 kg)] 175 lb 11.3 oz (79.7 kg) (09/16 0500)  Filed Weights   08/18/14 0339 08/18/14 1100 08/19/14 0500  Weight: 171 lb 8.3 oz (77.8 kg) 174 lb 6.1 oz (79.1 kg) 175 lb 11.3 oz (79.7 kg)    Weight change: 2 lb 13.9 oz (1.3 kg)   Hemodynamic parameters for last 24 hours:    Intake/Output from previous day: 09/15 0701 - 09/16 0700 In: 1753.4 [P.O.:1180; I.V.:273.4; IV Piggyback:300] Out: 345 [Urine:345]  Intake/Output this shift: Total I/O In: 10 [I.V.:10] Out: -   Current Meds: Scheduled Meds: . ampicillin (OMNIPEN) IV  2 g Intravenous 3 times per day  . antiseptic oral rinse  7 mL Mouth Rinse BID  . aspirin EC  81 mg Oral Daily  . atorvastatin  10 mg Oral QPM  . cefTRIAXone (ROCEPHIN)  IV  2 g Intravenous Q12H  . darbepoetin (ARANESP) injection - NON-DIALYSIS  100 mcg Subcutaneous Q Wed-1800  . feeding supplement (ENSURE COMPLETE)  237 mL Oral BID BM  . fluconazole (DIFLUCAN) IV  400 mg Intravenous Q24H  . folic acid  2 mg Oral Daily  . insulin aspart  0-15 Units Subcutaneous TID WC  . insulin aspart  0-5 Units Subcutaneous QHS  . pantoprazole  40 mg Oral Daily  . pyridOXINE  100 mg Oral QPM  . vitamin B-12  1,000 mcg Oral QPM  . vitamin E  400  Units Oral Daily   Continuous Infusions: . heparin 1,000 Units/hr (08/19/14 0700)   PRN Meds:.acetaminophen, heparin, levalbuterol, ondansetron (ZOFRAN) IV, ondansetron (ZOFRAN) IV, ondansetron, promethazine, traMADol, zolpidem  Physical Exam: General appearance: alert, cooperative and no distress Heart: regular rate and rhythm Lungs: Few crackles in bases bilaterally Extremities: Mild LE edema Wound: Clean and dry  Lab Results: CBC: Recent Labs  08/18/14 0400 08/19/14 0435  WBC 6.4 5.7  HGB 10.0* 9.7*  HCT 31.8* 30.7*  PLT 113* 125*   BMET:  Recent Labs  08/18/14 1540 08/19/14 0435  NA 131* 131*  K 5.0 4.9  CL 93* 93*  CO2 23 24  GLUCOSE 110* 73  BUN 43* 49*  CREATININE 3.09* 3.06*  CALCIUM 8.4 8.3*    PT/INR:  Recent Labs  08/19/14 0435  LABPROT 16.0*  INR 1.28   Radiology: Dg Chest Port 1 View  08/19/2014   CLINICAL DATA:  Follow-up of the fusions  EXAM: PORTABLE CHEST - 1 VIEW  COMPARISON:  Portable chest x-ray of August 18, 2014  FINDINGS: The lungs are adequately inflated. The pleural effusions have increased in conspicuity  bilaterally. The pulmonary interstitial markings remain mildly increased. The cardiopericardial silhouette is top-normal in size. A left atrial appendage clip is present. A mitral valve ring is present. The patient has undergone previous CABG. The permanent pacemaker is unchanged in position. The left internal jugular venous catheter tip lies overlies the midportion of the SVC  IMPRESSION: There is slight interval increase in the conspicuity of the bilateral pleural effusions. This may be at least in part due to the patient's semi erect positioning today as compared to yesterday's upright positioning. There mild pulmonary interstitial edema persists especially inferiorly.   Electronically Signed   By: David  Martinique   On: 08/19/2014 07:34   Dg Chest Port 1 View  08/18/2014   CLINICAL DATA:  Pleural effusions  EXAM: PORTABLE CHEST - 1 VIEW   COMPARISON:  August 17, 2014  FINDINGS: Pleural effusions bilaterally are stable. There is cardiomegaly with pulmonary vascularity within normal limits. The interstitium is mildly prominent, probably representing mild edema. There is no airspace consolidation.  Central catheter tip is in the superior cava. Pacemaker leads are attached to the right atrium right ventricle. There is evident of prosthetic cardiac valve. There is a left atrial clamp. No adenopathy. No pneumothorax.  IMPRESSION: Evidence of a degree of congestive heart failure. Effusions appear stable. No pneumothorax.   Electronically Signed   By: Lowella Grip M.D.   On: 08/18/2014 12:14     Assessment/Plan: S/P Procedure(s) (LRB): PERMANENT PACEMAKER INSERTION (N/A) CV- Acute diastolic CHF with RV failure, s/p PPM.   AHF team managing.   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 Continue Amp/Ceftriaxone through 9/17, Fluconazole x 1 month.  Appreciate ID's input.  Acute/chronic renal failure- making a little more urine (345/24 hrs), but more dyspneic with increased effusions on CXR and with increased edema on exam today. Per renal, will try HD today. Thrombocytopenia- plts stable, appreciate hematology input. Acute resp failure- pulm status stable.  Watch effusions. Hopefully will improve with volume removal. Deconditioning- CRPI, PT.  COLLINS,GINA H 08/19/2014 9:55 AM  I have seen and examined the patient and agree with the assessment and plan as outlined.  More dyspneic today.  Some interval increase size of bilateral pleural effusions R>L.  Will get repeat pCXR in am.  If effusions continue to increase in size and/or patient continues to experience SOB despite resuming dialysis therapy will consider Pleur-X catheter placement.  Zuzu Befort H 08/19/2014 4:25 PM

## 2014-08-19 NOTE — Progress Notes (Signed)
[name] seems to be in good spirits today. He is a very alert.  His blood count is 125,000. She is to continue to go up slowly.  His hemoglobin is 9.7. His creatinine is now up to 3.06. Looks like he will need some dialysis again. His LDH is 636.  There is no nausea vomiting. He's had no bleeding. He's had no fever.  He did do some physical therapy.  The one question is whether or not he will be moved over to the rehabilitation unit.  His vital signs are all stable. Blood pressure 122/50. Temperature 97.9. Pulse is 76. His lungs are clear. Cardiac exam regular in rhythm. He has no murmurs rubs or bruits. Abdomen soft. Good bowel sounds. There is no fluid wave. There is no palpable liver or spleen tip.   He did have a his x-ray today. I do not have the results. He did have a chest x-ray done yesterday. It shows some degree of congestive heart failure. Stable effusions.  He still continues treatment for the fungal blood infection.  You will finish up his treatments for the endocarditis with enterococcus tomorrow.  Again, his platelets count is slowly coming up. His hemoglobin will always be a lower side because of his renal insufficiency. I don't think he is hemolyzing. His corrected reticulocyte count is normal.  We will continue to help out in any way possible.  I think everybody further outstanding care that they are giving him.  Pete E.  Psalm 27:1

## 2014-08-20 ENCOUNTER — Inpatient Hospital Stay (HOSPITAL_COMMUNITY): Payer: Medicare Other | Admitting: Anesthesiology

## 2014-08-20 ENCOUNTER — Inpatient Hospital Stay (HOSPITAL_COMMUNITY): Payer: Medicare Other

## 2014-08-20 ENCOUNTER — Encounter (HOSPITAL_COMMUNITY): Payer: Medicare Other | Admitting: Anesthesiology

## 2014-08-20 ENCOUNTER — Encounter (HOSPITAL_COMMUNITY)
Admission: EM | Disposition: E | Payer: Self-pay | Source: Home / Self Care | Attending: Thoracic Surgery (Cardiothoracic Vascular Surgery)

## 2014-08-20 ENCOUNTER — Encounter (HOSPITAL_COMMUNITY): Payer: Self-pay | Admitting: Thoracic Surgery (Cardiothoracic Vascular Surgery)

## 2014-08-20 DIAGNOSIS — I5033 Acute on chronic diastolic (congestive) heart failure: Secondary | ICD-10-CM | POA: Diagnosis present

## 2014-08-20 DIAGNOSIS — J90 Pleural effusion, not elsewhere classified: Secondary | ICD-10-CM

## 2014-08-20 HISTORY — PX: CHEST TUBE INSERTION: SHX231

## 2014-08-20 LAB — RENAL FUNCTION PANEL
ANION GAP: 13 (ref 5–15)
Albumin: 2 g/dL — ABNORMAL LOW (ref 3.5–5.2)
BUN: 25 mg/dL — ABNORMAL HIGH (ref 6–23)
CALCIUM: 8 mg/dL — AB (ref 8.4–10.5)
CO2: 25 mEq/L (ref 19–32)
Chloride: 95 mEq/L — ABNORMAL LOW (ref 96–112)
Creatinine, Ser: 2.14 mg/dL — ABNORMAL HIGH (ref 0.50–1.35)
GFR, EST AFRICAN AMERICAN: 32 mL/min — AB (ref >90)
GFR, EST NON AFRICAN AMERICAN: 28 mL/min — AB (ref >90)
Glucose, Bld: 93 mg/dL (ref 70–99)
PHOSPHORUS: 4 mg/dL (ref 2.3–4.6)
POTASSIUM: 4 meq/L (ref 3.7–5.3)
SODIUM: 133 meq/L — AB (ref 137–147)

## 2014-08-20 LAB — GLUCOSE, CAPILLARY
GLUCOSE-CAPILLARY: 86 mg/dL (ref 70–99)
GLUCOSE-CAPILLARY: 133 mg/dL — AB (ref 70–99)
Glucose-Capillary: 85 mg/dL (ref 70–99)
Glucose-Capillary: 67 mg/dL — ABNORMAL LOW (ref 70–99)

## 2014-08-20 LAB — CBC
HCT: 30.6 % — ABNORMAL LOW (ref 39.0–52.0)
Hemoglobin: 9.5 g/dL — ABNORMAL LOW (ref 13.0–17.0)
MCH: 30.8 pg (ref 26.0–34.0)
MCHC: 31 g/dL (ref 30.0–36.0)
MCV: 99.4 fL (ref 78.0–100.0)
Platelets: 78 10*3/uL — ABNORMAL LOW (ref 150–400)
RBC: 3.08 MIL/uL — ABNORMAL LOW (ref 4.22–5.81)
RDW: 22.2 % — AB (ref 11.5–15.5)
WBC: 5.2 10*3/uL (ref 4.0–10.5)

## 2014-08-20 LAB — CULTURE, BLOOD (ROUTINE X 2)
CULTURE: NO GROWTH
Culture: NO GROWTH

## 2014-08-20 LAB — PROTIME-INR
INR: 1.29 (ref 0.00–1.49)
Prothrombin Time: 16.1 seconds — ABNORMAL HIGH (ref 11.6–15.2)

## 2014-08-20 LAB — CARBOXYHEMOGLOBIN
Carboxyhemoglobin: 2 % — ABNORMAL HIGH (ref 0.5–1.5)
Methemoglobin: 0.5 % (ref 0.0–1.5)
O2 SAT: 66.3 %
Total hemoglobin: 9.7 g/dL — ABNORMAL LOW (ref 13.5–18.0)

## 2014-08-20 LAB — RETICULOCYTES
RBC.: 3.08 MIL/uL — ABNORMAL LOW (ref 4.22–5.81)
RETIC CT PCT: 4.8 % — AB (ref 0.4–3.1)
Retic Count, Absolute: 147.8 10*3/uL (ref 19.0–186.0)

## 2014-08-20 LAB — HEPARIN LEVEL (UNFRACTIONATED): HEPARIN UNFRACTIONATED: 0.59 [IU]/mL (ref 0.30–0.70)

## 2014-08-20 LAB — LACTATE DEHYDROGENASE: LDH: 722 U/L — AB (ref 94–250)

## 2014-08-20 SURGERY — INSERTION, PLEURAL DRAINAGE CATHETER
Anesthesia: Monitor Anesthesia Care | Laterality: Bilateral

## 2014-08-20 MED ORDER — PROPOFOL INFUSION 10 MG/ML OPTIME
INTRAVENOUS | Status: DC | PRN
  Administered 2014-08-20: 50 ug/kg/min via INTRAVENOUS

## 2014-08-20 MED ORDER — PROPOFOL 10 MG/ML IV BOLUS
INTRAVENOUS | Status: DC | PRN
  Administered 2014-08-20: 20 mg via INTRAVENOUS

## 2014-08-20 MED ORDER — MIDAZOLAM HCL 2 MG/2ML IJ SOLN
INTRAMUSCULAR | Status: AC
  Filled 2014-08-20: qty 2

## 2014-08-20 MED ORDER — FLUCONAZOLE IN SODIUM CHLORIDE 200-0.9 MG/100ML-% IV SOLN: 200.0000 mg | INTRAVENOUS | Status: DC

## 2014-08-20 MED ORDER — FLUCONAZOLE IN SODIUM CHLORIDE 200-0.9 MG/100ML-% IV SOLN
200.0000 mg | INTRAVENOUS | Status: DC
  Filled 2014-08-20: qty 100

## 2014-08-20 MED ORDER — HYDROMORPHONE HCL 1 MG/ML IJ SOLN: 0.2500 mg | INTRAMUSCULAR | Status: DC | PRN

## 2014-08-20 MED ORDER — FLUCONAZOLE IN SODIUM CHLORIDE 200-0.9 MG/100ML-% IV SOLN
200.0000 mg | INTRAVENOUS | Status: DC
  Administered 2014-08-20 – 2014-08-26 (×7): 200 mg via INTRAVENOUS
  Filled 2014-08-20 (×7): qty 100

## 2014-08-20 MED ORDER — 0.9 % SODIUM CHLORIDE (POUR BTL) OPTIME
TOPICAL | Status: DC | PRN
  Administered 2014-08-20: 1000 mL

## 2014-08-20 MED ORDER — HEPARIN (PORCINE) IN NACL 100-0.45 UNIT/ML-% IJ SOLN
1000.0000 [IU]/h | INTRAMUSCULAR | Status: DC
  Administered 2014-08-20: 1000 [IU]/h via INTRAVENOUS
  Filled 2014-08-20: qty 250

## 2014-08-20 MED ORDER — PROPOFOL 10 MG/ML IV BOLUS
INTRAVENOUS | Status: AC
  Filled 2014-08-20: qty 20

## 2014-08-20 MED ORDER — PHENYLEPHRINE HCL 10 MG/ML IJ SOLN
INTRAMUSCULAR | Status: DC | PRN
  Administered 2014-08-20 (×3): 80 ug via INTRAVENOUS

## 2014-08-20 MED ORDER — ONDANSETRON HCL 4 MG/2ML IJ SOLN: 4.0000 mg | Freq: Once | INTRAMUSCULAR | Status: DC | PRN

## 2014-08-20 MED ORDER — SODIUM CHLORIDE 0.9 % IV SOLN
INTRAVENOUS | Status: DC | PRN
  Administered 2014-08-20: 13:00:00 via INTRAVENOUS

## 2014-08-20 MED ORDER — FENTANYL CITRATE 0.05 MG/ML IJ SOLN
INTRAMUSCULAR | Status: DC | PRN
  Administered 2014-08-20: 50 ug via INTRAVENOUS

## 2014-08-20 MED ORDER — FENTANYL CITRATE 0.05 MG/ML IJ SOLN
INTRAMUSCULAR | Status: AC
  Filled 2014-08-20: qty 5

## 2014-08-20 SURGICAL SUPPLY — 27 items
BRUSH SCRUB EZ PLAIN DRY (MISCELLANEOUS) ×8 IMPLANT
CANISTER SUCTION 2500CC (MISCELLANEOUS) ×2 IMPLANT
COVER SURGICAL LIGHT HANDLE (MISCELLANEOUS) ×2 IMPLANT
DERMABOND ADVANCED (GAUZE/BANDAGES/DRESSINGS) ×1
DERMABOND ADVANCED .7 DNX12 (GAUZE/BANDAGES/DRESSINGS) ×1 IMPLANT
DRAPE C-ARM 42X72 X-RAY (DRAPES) ×2 IMPLANT
DRAPE LAPAROSCOPIC ABDOMINAL (DRAPES) ×4 IMPLANT
GLOVE BIOGEL M 6.5 STRL (GLOVE) ×2 IMPLANT
GLOVE BIOGEL PI IND STRL 6.5 (GLOVE) ×1 IMPLANT
GLOVE BIOGEL PI INDICATOR 6.5 (GLOVE) ×1
GLOVE ECLIPSE 6.5 STRL STRAW (GLOVE) ×2 IMPLANT
GLOVE ORTHO TXT STRL SZ7.5 (GLOVE) ×2 IMPLANT
GOWN STRL REUS W/ TWL LRG LVL3 (GOWN DISPOSABLE) ×3 IMPLANT
GOWN STRL REUS W/TWL LRG LVL3 (GOWN DISPOSABLE) ×3
KIT BASIN OR (CUSTOM PROCEDURE TRAY) ×2 IMPLANT
KIT PLEURX DRAIN CATH 1000ML (MISCELLANEOUS) ×8 IMPLANT
KIT PLEURX DRAIN CATH 15.5FR (DRAIN) ×4 IMPLANT
KIT ROOM TURNOVER OR (KITS) ×2 IMPLANT
NS IRRIG 1000ML POUR BTL (IV SOLUTION) ×2 IMPLANT
PACK GENERAL/GYN (CUSTOM PROCEDURE TRAY) ×2 IMPLANT
PAD ARMBOARD 7.5X6 YLW CONV (MISCELLANEOUS) ×4 IMPLANT
SET DRAINAGE LINE (MISCELLANEOUS) IMPLANT
SUT ETHILON 3 0 FSL (SUTURE) ×4 IMPLANT
SUT VIC AB 3-0 X1 27 (SUTURE) ×4 IMPLANT
TOWEL OR 17X24 6PK STRL BLUE (TOWEL DISPOSABLE) ×2 IMPLANT
TOWEL OR 17X26 10 PK STRL BLUE (TOWEL DISPOSABLE) ×2 IMPLANT
WATER STERILE IRR 1000ML POUR (IV SOLUTION) ×2 IMPLANT

## 2014-08-20 NOTE — Anesthesia Preprocedure Evaluation (Addendum)
Anesthesia Evaluation  Patient identified by MRN, date of birth, ID band Patient awake    Reviewed: Allergy & Precautions, H&P , NPO status , Patient's Chart, lab work & pertinent test results  Airway Mallampati: II      Dental  (+) Dental Advidsory Given   Pulmonary shortness of breath and at rest, sleep apnea , former smoker,          Cardiovascular hypertension, On Medications + CAD, + Past MI, + CABG and +CHF - dysrhythmias + pacemaker + Valvular Problems/Murmurs Rhythm:regular     Neuro/Psych TIA   GI/Hepatic   Endo/Other    Renal/GU Renal disease     Musculoskeletal  (+) Arthritis -,   Abdominal   Peds  Hematology  (+) anemia ,   Anesthesia Other Findings Recurrent pleural effusions  Reproductive/Obstetrics                          Anesthesia Physical Anesthesia Plan  ASA: IV  Anesthesia Plan: MAC   Post-op Pain Management:    Induction: Intravenous  Airway Management Planned: Mask  Additional Equipment:   Intra-op Plan:   Post-operative Plan:   Informed Consent: I have reviewed the patients History and Physical, chart, labs and discussed the procedure including the risks, benefits and alternatives for the proposed anesthesia with the patient or authorized representative who has indicated his/her understanding and acceptance.   Dental Advisory Given  Plan Discussed with: Anesthesiologist, CRNA and Surgeon  Anesthesia Plan Comments:        Anesthesia Quick Evaluation

## 2014-08-20 NOTE — Progress Notes (Signed)
CARE MANAGEMENT NOTE 08/26/2014  Patient:  Justin [name], Justin [name]   Account Number:  [id number]  Date Initiated:  07/07/2014  Documentation initiated by:  [facility]  Subjective/Objective Assessment:   Admitted with sepsis     Action/Plan:   Anticipated DC Date:  08/19/2014   Anticipated DC Plan:  SKILLED NURSING FACILITY  In-house referral  Clinical Social Worker      DC Planning Services  CM consult      Choice offered to / List presented to:  C-1 Patient           Status of service:  Completed, signed off Medicare Important Message given?  YES (If response is "NO", the following Medicare IM given date fields will be blank) Date Medicare IM given:  08/13/2014 Medicare IM given by:  Elissa Hefty Date Additional Medicare IM given:  08/10/2014 Additional Medicare IM given by:  [facility]  Discharge Disposition:    Per UR Regulation:  Reviewed for med. necessity/level of care/duration of stay  If discussed at [place] of Stay Meetings, dates discussed:   07/05/2014  07/16/2014  07/21/2014  07/23/2014  08/11/2014  08/13/2014  08/18/2014  08/11/2014    Comments:  [id number] Elly Haffey,RN,BSN,CCM: hemodialysis on [id number]/tolerated well/remains hypotensive and weak/for pleurx-cath insertions today to increase lung expansion/last dose of iv ampicillin later today and iv fluconazole continued-may go home with x1 month/iv heparin. Poss.  good ltach candidate due to ppm,iv needs and pleurx cath/ PICC line is patent. [id number] Dereke Neumann,RN,BSN,CCM: Patient continues to have difficulty with CRRT clotting/iv inotrope continuing/pod42/stable at this point/iv heparin started.    08/04/14 Ellan Lambert, RN, BSN [phone] CSW cont to follow for dc to SNF when medically stable. Will follow progress.  07/29/2014 [place] MSN BSN CCM To cath lab for pacer.  07/29/14 1012 [place] MSN BSN CCM Pt is a resident of [facility] (I) living - notified CSW that pt will need  SNF for rehab - can d/c to [facility] when medically stable.  Talked with son who agrees.  Pt ambulated short distance with PT, PTA.  07/27/14 1201 [place] MSN BSN CCM Admitted to 2S s/p CABG/AVR/ARR; extubated-on 4L [place], remains on dopamine gtt.  Crystal Hutchinson RN, BSN, MSHL, CCM  Nurse - Case Manager,  ([facility] [place])  [phone]  07/10/2014 PT RECS:  HH PT Dispositon Plan:  HHS:  PT and possible IV ABX pending and will need PICC prior to d/c.    Contact:  Eller,Dolores Spouse [phone]   [phone]

## 2014-08-20 NOTE — OR Nursing (Addendum)
1600 mL pleural fluid drained from right side, 1200 mL pleural  fluid drained from left side.  Lidocaine 1% used from Pleurex catheter kits exp. Dates 09/12/2016 & 05/04/2017 79mL injected in right side, 16ml injected in left side.

## 2014-08-20 NOTE — Progress Notes (Signed)
Pt transferred to OR on monitor by myself and transporter. Emotional support given to pt & to wife.

## 2014-08-20 NOTE — Anesthesia Postprocedure Evaluation (Signed)
  Anesthesia Post-op Note  Patient: Justin [name]  Procedure(s) Performed: Procedure(s) with comments: INSERTION PLEURAL DRAINAGE CATHETER (Bilateral) - bilateral Pleur-X catheter placement  Patient Location: PACU  Anesthesia Type:MAC  Level of Consciousness: awake, oriented, sedated and patient cooperative  Airway and Oxygen Therapy: Patient Spontanous Breathing  Post-op Pain: none  Post-op Assessment: Post-op Vital signs reviewed, Patient's Cardiovascular Status Stable, Respiratory Function Stable, Patent Airway, No signs of Nausea or vomiting and Pain level controlled  Post-op Vital Signs: stable  Last Vitals:  Filed Vitals:   08/27/2014 1200  BP: 135/66  Pulse:   Temp:   Resp: 24    Complications: No apparent anesthesia complications

## 2014-08-20 NOTE — Progress Notes (Signed)
ANTIFUNGAL CONSULT NOTE - FOLLOW UP  80 yo M admitted 07/09/2014 with SOB, now s/p porcine aortic root/valve, pericardial tissue MVR, CABG x 1, MAZE pacer extraction. Post-op sepsis with thrombocytopenia and ARF. Pharmacy consulted to renally adjust antibiotics & resume anticoagulation for afib 9/14.  Patient is on last day of amp/CTX for enterococcal endocarditis and continues on fluconazole for candida parapsilosis fungemia and candida albicans UTI. Patient has now been transitioned to [facility] so will adjust dose.   Plan: - Change fluconazole to 200 mg IV q24h (to continue x 1 month per ID)  - Monitor plans for continued iHD  Harolyn Rutherford, PharmD Clinical Pharmacist - Resident Pager: [phone] Pharmacy: [phone] 08/18/2014 11:05 AM

## 2014-08-20 NOTE — Progress Notes (Addendum)
ANTICOAGULATION CONSULT NOTE - Follow Up Consult  Pharmacy Consult for Heparin Indication: atrial fibrillation, recent tissue valve replacement  No Known Allergies  Patient Measurements: Height: 5\' 11"  (180.3 cm) Weight: 166 lb 11.2 oz (75.615 kg) IBW/kg (Calculated) : 75.3  Vital Signs: Temp: 98.3 F (36.8 C) (09/17 0735) Temp src: Oral (09/17 0735) BP: 115/65 mmHg (09/17 0600) Pulse Rate: 99 (09/17 0400)  Labs:  Recent Labs  08/18/14 0400 08/18/14 1540 08/19/14 0435 08/19/14 1130 08/17/2014 0440  HGB 10.0*  --  9.7* 9.5*  --   HCT 31.8*  --  30.7* 29.6*  --   PLT 113*  --  125* 132*  --   LABPROT 16.2*  --  16.0*  --  16.1*  INR 1.30  --  1.28  --  1.29  HEPARINUNFRC 0.45  --  0.47  --  0.59  CREATININE 2.81* 3.09* 3.06*  --  2.14*    Estimated Creatinine Clearance: 29.8 ml/min (by C-G formula based on Cr of 2.14).   Medical History: Past Medical History  Diagnosis Date  . CAD (coronary artery disease) prior stenting 1999   . Dyslipidemia   . HTN (hypertension)   . WPW (Wolff-Parkinson-White syndrome) loss of preexcitation   . Atrial fibrillation   . AV block, Mobitz II   . Presyncope   . Myocardial infarction   . Pacemaker 04/07/2013  . Arthritis   . History of chicken pox   . Anemia   . Hypernatremia 06/03/2013  . Thrombocytopenia, unspecified 06/03/2013  . Leukopenia 06/03/2013  . Obstructive sleep apnea 06/03/2013    USES CPAP  . Urinary incontinence 06/03/2013  . Hyperglycemia 12/21/2013  . Pancytopenia 06/03/2013  . Iron deficiency anemia, unspecified 07/02/2014  . Malabsorption of iron 07/02/2014  . Hypotestosteronemia 07/02/2014  . ITP (idiopathic thrombocytopenic purpura) 07/02/2014    possible - although patient had bacterial endocarditis at the time of diagnosis  . Aortic insufficiency   . Chronic kidney disease   . Bacterial endocarditis      Enterococcus sepsis with aortic valve vegetation  . UTI (urinary tract infection) 08/02/2014    ENTEROBACTER  CLOACAE   . S/P aortic valve and mitral valve replacement 08/01/2014    21 mm Med[employer] Freestyle porcine aortic root graft 29 mm [facility] Mitral bovine bioprosthetic mitral valve   . S/P aortic valve replacement with stentless valve 07/10/2014    21 mm Medtronic Freestyle porcine aortic root graft with reimplantation of left main and right coronary arteries  . S/P mitral valve replacement with bioprosthetic valve 07/25/2014    29 mm [facility] Mitral bovine bioprosthetic tissue valve  . S/P CABG x 1 07/17/2014    SVG to OM1 with EVH via right thigh  . S/P Maze operation for atrial fibrillation 07/10/2014    Complete bilateral atrial lesion set using cryothermy and bipolar radiofrequency ablation with clipping of LA appendage  . Acute renal failure superimposed on stage 3 chronic kidney disease 07/27/2014  . Acute on chronic diastolic heart failure 07/12/9832  . Pleural effusions, bilateral, recurrent     Medications:  Prescriptions prior to admission  Medication Sig Dispense Refill  . atorvastatin (LIPITOR) 10 MG tablet Take 1 tablet (10 mg total) by mouth every evening.  90 tablet  3  . Azelaic Acid (FINACEA) 15 % cream Apply 1 application topically daily. After skin is thoroughly washed and patted dry, gently but thoroughly massage a thin film of azelaic acid creame      .  Cholecalciferol (VITAMIN D-3) 1000 UNITS CAPS Take 1 capsule by mouth daily.      . furosemide (LASIX) 20 MG tablet Take 3 tablets (60 mg total) by mouth daily.  90 tablet  12  . losartan (COZAAR) 50 MG tablet Take 1 tablet (50 mg total) by mouth daily.  90 tablet  3  . Multiple Vitamin (MULTIVITAMIN WITH MINERALS) TABS Take 1 tablet by mouth 2 (two) times a week.       . nadolol (CORGARD) 20 MG tablet Take 10 mg by mouth daily.      . nitroGLYCERIN (NITROSTAT) 0.4 MG SL tablet Place 1 tablet (0.4 mg total) under the tongue every 5 (five) minutes as needed for chest pain. x3 doses as needed for chest pain  25 tablet   12  . omeprazole (PRILOSEC) 20 MG capsule Take 20 mg by mouth daily as needed (for heartburn).       Vladimir Faster Glycol-Propyl Glycol (SYSTANE ULTRA OP) Apply 1 drop to eye 4 (four) times daily as needed (for dry eyes).      . predniSONE (DELTASONE) 20 MG tablet Take 80 mg by mouth daily with breakfast.      . Probiotic Product (PROBIOTIC DAILY PO) Take 1 capsule by mouth daily.       Marland Kitchen pyridOXINE (VITAMIN B-6) 100 MG tablet Take 50 mg by mouth every evening.       . tamsulosin (FLOMAX) 0.4 MG CAPS capsule Take 0.4 mg by mouth at bedtime.      . vitamin B-12 (CYANOCOBALAMIN) 1000 MCG tablet Take 1,000 mcg by mouth every evening.      . Vitamin E (VITA-PLUS E PO) Take 1 capsule by mouth daily.          Assessment: 80 yo M admitted 07/14/2014 with SOB, OHS 8/21: porcine aortic root/valve, Pericardial tissue MVR, CABG x 1, Maze pacer extraction. Post op sepsis with thrombocytopenia and ARF.  Pharmacy consulted to renally adjust antibiotics & resume anticoagulation for afib 9/14.  PMH: CAD s/p stenting 1999, HTN/HLD, WPW, PAF, PPM, arthritis, IDA, OSA, ITP, AVR/MVR, CKDIII  Coag/Heme - ITP - goal: reduce bleeding risk, PAF s/p MAZE; AVR/MVR - goal: rate/rhythm control, px TE  Plts improved 29> 133; LDH continues to climb 722, In and out of afib, now vpaced; H/H stable, after 2u PRBC on 9/8  S/p 5 days IVIG + Nplate x 1 on 9/7, Aranesp qWed; 2/2 acute illness/h/o ITP, Reload with IV amio now off, coumadin on hold since 9/5 by Dr. Marin Olp; started on hep gtt on 9/14,  Therapeutic HL 0.59 on 1000 units/hr.  No bleeding or complications noted.  Goal of Therapy:  Heparin level 0.3-0.7 units/ml Monitor platelets by anticoagulation protocol: Yes Renal adjustment of antibiotics   Plan:  1. Continue heparin 1000 units/hr. 2. Daily heparin level, CBC  Bonnita Nasuti Pharm.D. CPP, BCPS Clinical Pharmacist [phone] 08/21/2014 7:48 AM     Addendum:   Patient now s/p bilateral pleur-x drains, heparin  has been off since 630am. Dr. Roxy Manns ok with restarting anticoagulation now, will not bolus and will restart at previous rate. Patient has been stable on this dose previously will recheck heparin level in am.  Erin Hearing PharmD., BCPS Clinical Pharmacist Pager [phone] 08/09/2014 4:38 PM

## 2014-08-20 NOTE — Progress Notes (Signed)
Subjective:   Tolerated 4 hours of HD surprisingly well yesterday with 2.5 liters off and only a single drop in BP Made 585 of urine on his own! Feels more SOB however - plan is for bilateral Pleurex tubes today which I suspect will help his breathing substantially  Objective Vital signs in last 24 hours: Filed Vitals:   08/15/2014 0600 08/31/2014 0700 08/23/2014 0735 08/25/2014 0800  BP: 115/65 116/83  130/52  Pulse:      Temp:   98.3 F (36.8 C)   TempSrc:   Oral   Resp: 27 23  23   Height:      Weight:      SpO2: 100% 100%  99%   Weight change: -0.9 kg (-1 lb 15.7 oz)  Intake/Output Summary (Last 24 hours) at 08/24/2014 0852 Last data filed at 08/16/2014 0630  Gross per 24 hour  Intake   1665 ml  Output   3103 ml  Net  -1438 ml   UOP 585 YESTERDAY!!!  Weight Trending 08/19/14 0500 79.7 kg (175 lb 11.3 oz) 08/18/14 0339 77.8 kg (171 lb 8.3 oz)  (79.1 on standup scales) 08/17/14 0500 75.3 kg (166 lb 0.1 oz)   CRRT discontinued 08/16/14 0500 79.3 kg (174 lb 13.2 oz) 08/15/14 0446 84.3 kg (185 lb 13.6 oz) 08/14/14 0255 89.4 kg (197 lb 1.5 oz)  08/13/14 0500 89.2 kg (196 lb 10.4 oz) 08/12/14 0413 100.2 kg (220 lb 14.4 oz) (CRRT initiated 9/8)  Physical Exam: General: Very nice thin WM NAD  Seems somewhat dyspneic Heart:S1S2 No def s3  Lungs: Very diminished breath sounds at bases Abdomen: non-distended, non-tender Extremities: Trace to 1+ pitting edema pre-tibial Access- Vas cath (placed 9/11) in left IJ position with loose dressing  Basic Metabolic Panel:  Recent Labs  08/19/14 0435 08/24/2014 0440  NA 131* 133*  K 4.9 4.0  CL 93* 95*  CO2 24 25  GLUCOSE 73 93  BUN 49* 25*  CREATININE 3.06* 2.14*  CALCIUM 8.3* 8.0*  PHOS 4.7* 4.0   Liver Function Tests:  Recent Labs  08/19/14 0435 08/30/2014 0440  ALBUMIN 1.9* 2.0*   CBC:  Recent Labs  08/19/14 1130 08/17/2014 0440  WBC 4.8 5.2  HGB 9.5* 9.5*  HCT 29.6* 30.6*  MCV 98.3 99.4  PLT 132* 78*    Recent  Labs  08/10/2014 0440  RETICCTPCT 4.8*   Studies/Results: Dg Chest Port 1 View  08/17/2014   CLINICAL DATA:  Follow-up of respiratory failure ; shortness of breath  EXAM: PORTABLE CHEST - 1 VIEW  COMPARISON:  Portable chest x-ray of August 15, 2014  FINDINGS: The lungs are adequately inflated. There are small bilateral pleural effusions greater on the right than on the left. There is no alveolar infiltrate. The cardiopericardial silhouette is top-normal in size. A valve ring is in place in the mitral position. A left atrial appendage clip is present. The patient has undergone previous CABG. A left internal jugular venous catheter tip lies in the proximal SVC and is stable. The permanent pacemaker is unchanged in position. The observed portions of the bony thorax are unremarkable.  IMPRESSION: There has been mild interval improvement in the appearance of the pulmonary interstitium suggesting resolving pulmonary edema. There remain small bilateral pleural effusions greater on the right than on the left.   Electronically Signed   By: David  Martinique     9/16 There is slight interval increase in the conspicuity of the  bilateral pleural effusions. This may be at  least in part due to the  patient's semi erect positioning today as compared to yesterday's  upright positioning. There mild pulmonary interstitial edema  persists especially inferiorly.  Medications:    Scheduled Medications: . ampicillin (OMNIPEN) IV  2 g Intravenous 3 times per day  . antiseptic oral rinse  7 mL Mouth Rinse BID  . aspirin EC  81 mg Oral Daily  . atorvastatin  10 mg Oral QPM  . cefTRIAXone (ROCEPHIN)  IV  2 g Intravenous Q12H  . [START ON 08/21/2014] darbepoetin (ARANESP) injection - NON-DIALYSIS  100 mcg Subcutaneous Q Fri-1800  . feeding supplement (ENSURE COMPLETE)  237 mL Oral BID BM  . fluconazole (DIFLUCAN) IV  200 mg Intravenous Q24H  . folic acid  2 mg Oral Daily  . insulin aspart  0-15 Units Subcutaneous TID  WC  . insulin aspart  0-5 Units Subcutaneous QHS  . pantoprazole  40 mg Oral Daily  . pyridOXINE  100 mg Oral QPM  . vitamin B-12  1,000 mcg Oral QPM  . vitamin E  400 Units Oral Daily   Background Pt is a 80 y.o. yo male who was admitted on 07/16/2014 with A on CRF (baseline around 1.5) s/p complicated cardiac surgery with complications - now fungemia  Plan AKI on CKD d/t ATN -  CRRT  9/8 - 9/14 with reduction in weight by around 25 kg (was massively overloaded).  Tolerated intermittent HD yesterday better than I expected.  More dyspneic but this may be related to his pleural effusions and is for bilat tubes today which I suspect will help a lot. No absolute indication for HD today.  He may be trying to recover some function, with 585 of UOP yesterday! Will reassess in the AM for dialysis.  If he is still HD dep at beginning of next week should probably proceed with tunnelled catheter.  Anemia- Aranesp 100 QWed. Also s/p transfusion- stable Cardiac -s/p Ao root replacement, AoVR, MVR, CABG and MAZE procedure/PAF - per cards and TCTS. 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   Jamal Maes, MD [facility] [phone] Pager 08/22/2014, 8:52 AM

## 2014-08-20 NOTE — Progress Notes (Signed)
Subjective:   CVVHD stopped (9/14). Underwent HD yesterday and weight 6 lbs down. 24 hr I/O increasing 585 cc. Creatinine 2.14. Remains weak. More SOB.    Today is last day of ampicillic /cetriaxone for enterrococcus and will remain on fluconazole due to Candida parapsilosis fungemia/funguira   Echo 9/11: EF 35-40%. No obvious vegetation on TTE  Co-ox stable off milrinone.    Objective:   Scheduled Meds: . ampicillin (OMNIPEN) IV  2 g Intravenous 3 times per day  . antiseptic oral rinse  7 mL Mouth Rinse BID  . aspirin EC  81 mg Oral Daily  . atorvastatin  10 mg Oral QPM  . cefTRIAXone (ROCEPHIN)  IV  2 g Intravenous Q12H  . [START ON 08/21/2014] darbepoetin (ARANESP) injection - NON-DIALYSIS  100 mcg Subcutaneous Q Fri-1800  . feeding supplement (ENSURE COMPLETE)  237 mL Oral BID BM  . fluconazole (DIFLUCAN) IV  400 mg Intravenous Q24H  . folic acid  2 mg Oral Daily  . insulin aspart  0-15 Units Subcutaneous TID WC  . insulin aspart  0-5 Units Subcutaneous QHS  . pantoprazole  40 mg Oral Daily  . pyridOXINE  100 mg Oral QPM  . vitamin B-12  1,000 mcg Oral QPM  . vitamin E  400 Units Oral Daily   Continuous Infusions:   PRN Meds:.acetaminophen, heparin, levalbuterol, ondansetron (ZOFRAN) IV, ondansetron (ZOFRAN) IV, ondansetron, promethazine, traMADol, zolpidem  Filed Vitals:   08/10/2014 0300 08/08/2014 0400 08/12/2014 0402 08/10/2014 0600  BP: 115/86 112/63  115/65  Pulse:  99    Temp:   98.2 F (36.8 C)   TempSrc:   Oral   Resp: 29 26  27   Height:      Weight:   166 lb 11.2 oz (75.615 kg)   SpO2: 100% 100%  100%    Intake/Output from previous day:  Intake/Output Summary (Last 24 hours) at 08/14/2014 0731 Last data filed at 08/09/2014 0630  Gross per 24 hour  Intake   1675 ml  Output   3103 ml  Net  -1428 ml    Physical Exam:  General: Comfortable, sitting up in bed Skin is warm and dry.  HEENT is normal.  Neck is supple. JVP difficult to assess but appears  9-10; LIJ trialysis cath Chest clear. Pacemaker site with no hematoma Lungs: diminished in the bases R>L Cardiovascular exam is regular rate and rhythm. 2/6 systolic murmur Abdominal exam nontender or distended. No masses palpated. Extremities show . no edema. TED hose in place. neuro grossly intact  V pacing   Lab Results: Basic Metabolic Panel:  Recent Labs  08/19/14 0435 08/31/2014 0440  NA 131* 133*  K 4.9 4.0  CL 93* 95*  CO2 24 25  GLUCOSE 73 93  BUN 49* 25*  CREATININE 3.06* 2.14*  CALCIUM 8.3* 8.0*  PHOS 4.7* 4.0   CBC:  Recent Labs  08/19/14 0435 08/19/14 1130  WBC 5.7 4.8  HGB 9.7* 9.5*  HCT 30.7* 29.6*  MCV 98.4 98.3  PLT 125* 132*     Assessment/Plan:  1. Status post aortic root replacement, bioprosthetic aortic valve replacement, bioprosthetic mitral valve replacement, coronary artery bypass graft (to LCx system) and maze. 2. Acute on chronic kidney failure-nephrology following.  3. Thrombocytopenia-improved. ITP.  Hematology following. 4. Paroxysmal atrial fibrillation- 5. CAD-continue aspirin and statin. Had SVG-LCx system with recent cardiac surgery.  6. 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 8. Acute  diastolic CHF with prominent RV failure.  EF 50-55%-> 35-40%.  9. Acute respiratory failure 10. Fungemia/funguria 11. Pleural effusions: s/p thoracentesis on right.   PLAN/DISCUSSION   CVVHD stopped (9/14). Renal function started trending up as well as fluid and underwent HD yesterday. Weight down 6 lbs. 24 hr I/O increased to 585 cc.  Still major concern here is if his kidneys will recover.   Agree with Dr. Roxy Manns that he likely is not good TEE candidate currently and probably wouldn't change our management at this point as he is not a re-operative candidate. No obvious vegetations on TTE. Today is last day of abx but will continue on antifungals for at least a month. Appreciate  IDs help.   Remains off milrinone and co-ox stable. SBP 100-120s will not titrate anything.  ICD interrogated (9/14) and V pacing with SR majority of the time with about 12% of afib. He is now off amiodarone. Will continue to follow.   Platelets trending up slowly. No bleeding  Heparin off currently and may undergo pleurex catheter today for R pleural effusion.   Junie Bame B NP-C 7:31 AM  Patient seen and examined with Junie Bame, NP. We discussed all aspects of the encounter. I agree with the assessment and plan as stated above.   A bit more dyspneic today despite HD yesterday. Effusions seem to be worse. Possible bilateral pleurex tubes today. Co-ox fine off milrinone. Continue with current plan.   Rayjon Wery,MD 9:11 AM

## 2014-08-20 NOTE — Progress Notes (Signed)
PT Cancellation Note  Patient Details Name: ELIAZ FOUT MRN: [id number] DOB: [birth year]   Cancelled Treatment:    Reason Eval/Treat Not Completed: Patient at procedure or test/unavailable--to surgery for bil pleurex catheters to be placed.    Emika Tiano 08/07/2014, 12:54 PM Pager [phone]

## 2014-08-20 NOTE — Transfer of Care (Signed)
Immediate Anesthesia Transfer of Care Note  Patient: Justin [name]  Procedure(s) Performed: Procedure(s) with comments: INSERTION PLEURAL DRAINAGE CATHETER (Bilateral) - bilateral Pleur-X catheter placement  Patient Location: PACU  Anesthesia Type:MAC  Level of Consciousness: awake, alert  and oriented  Airway & Oxygen Therapy: Patient Spontanous Breathing and Patient connected to nasal cannula oxygen  Post-op Assessment: Report given to PACU RN, Post -op Vital signs reviewed and stable and Patient moving all extremities X 4  Post vital signs: Reviewed and stable  Complications: No apparent anesthesia complications

## 2014-08-20 NOTE — Op Note (Signed)
CARDIOTHORACIC SURGERY OPERATIVE NOTE  Date of Procedure:  08/25/2014  Preoperative Diagnosis: Bilateral Recurrent Pleural Effusions  Postoperative Diagnosis: Same  Procedure: Placement of Bilateral Pleur-X Catheters  Surgeon: Valentina Gu. Roxy Manns, MD  Anesthesia: 1% lidocaine local with intravenous sedation (MAC)     DETAILS OF THE OPERATIVE PROCEDURE  Following full informed consent the patient was brought to the operating room where intravenous sedation was administered and the patient continuously monitored by the anesthesia staff. The right lateral chest was prepared and draped in a sterile manner. A timeout procedure was performed.  1% lidocaine was utilized to anesthetize the skin and subcutaneous tissues. A small stab incision was made and an angiocath placed into the right pleural space near the posterior axillary line. A guidewire was advanced through the angiocath and positioned in the posterior pleural space using fluoroscopic guidance.  A Pleur-X catheter was tunneled from a second stab incision located more anteriorly to the posterior stab incision.  Dilating catheters and an introducing sheath were passed over the guidewire.  The guidewire and dilating catheter were removed and the Pleur-X catheter advanced through the introducing sheath into the pleural space.  The sheath was removed and the final position of the catheter verified using fluoroscopy.  The catheter was secured to the skin and connected to a closed suction vacuum bottle for drainage. A total of 1600 mL of serous fluid was evacuated.   The left chest was prepared and draped in a sterile manner. A timeout procedure was performed.  1% lidocaine was utilized to anesthetize the skin and subcutaneous tissues. A small stab incision was made and an angiocath placed into the left pleural space near the posterior axillary line. A guidewire was advanced through the angiocath and positioned in the posterior pleural space using  fluoroscopic guidance.  A Pleur-X catheter was tunneled from a second stab incision located more anteriorly to the posterior stab incision.  Dilating catheters and an introducing sheath were passed over the guidewire.  The guidewire and dilating catheter were removed and the Pleur-X catheter advanced through the introducing sheath into the pleural space.  The sheath was removed and the final position of the catheter verified using fluoroscopy.  The catheter was secured to the skin and connected to a closed suction vacuum bottle for drainage. A total of 1250 mL of serous fluid was evacuated.    The patient tolerated the procedure well and was transported to the PACU in stable condition.  There were no complications.  Estimated blood loss was trivial.   Valentina Gu. Roxy Manns, MD 08/13/2014 2:23 PM

## 2014-08-20 NOTE — Progress Notes (Signed)
TCTS BRIEF PROGRESS NOTE  Day of Surgery  S/P Procedure(s) (LRB): INSERTION PLEURAL DRAINAGE CATHETER (Bilateral)   Doing well following return from OR Breathing much improved  Plan: Will resume IV heparin.  I would favor starting coumadin unless the patient will need tunneled HD catheter placed in the near future - will discuss w/ Nephrology team.  Rexene Alberts 08/16/2014 4:54 PM

## 2014-08-20 NOTE — Progress Notes (Addendum)
[facility] DAILY ICU PROGRESS NOTE                   [address]            ,[place] [address]          [phone]   20 Days Post-Op Procedure(s) (LRB): PERMANENT PACEMAKER INSERTION (N/A)  Total Length of Stay:  LOS: 45 days   Subjective: Discouraged.  "I just keep losing ground."  Weak after HD yesterday. +DOE   Objective: Vital signs in last 24 hours: Temp:  [97.9 F (36.6 C)-98.8 F (37.1 C)] 98.3 F (36.8 C) (09/17 0735) Pulse Rate:  [77-99] 99 (09/17 0400) Cardiac Rhythm:  [-] Ventricular paced (09/17 0800) Resp:  [19-45] 23 (09/17 0800) BP: (85-143)/(45-86) 130/52 mmHg (09/17 0800) SpO2:  [94 %-100 %] 99 % (09/17 0800) Weight:  [166 lb 11.2 oz (75.615 kg)-172 lb 6.4 oz (78.2 kg)] 166 lb 11.2 oz (75.615 kg) (09/17 0402)  Filed Weights   08/19/14 0500 08/19/14 1115 08/31/2014 0402  Weight: 175 lb 11.3 oz (79.7 kg) 172 lb 6.4 oz (78.2 kg) 166 lb 11.2 oz (75.615 kg)    Weight change: -1 lb 15.7 oz (-0.9 kg)   Hemodynamic parameters for last 24 hours:    Intake/Output from previous day: 09/16 0701 - 09/17 0700 In: 1675 [P.O.:600; I.V.:225; IV Piggyback:850] Out: 3103 [Urine:585]  Intake/Output this shift:    Current Meds: Scheduled Meds: . ampicillin (OMNIPEN) IV  2 g Intravenous 3 times per day  . antiseptic oral rinse  7 mL Mouth Rinse BID  . aspirin EC  81 mg Oral Daily  . atorvastatin  10 mg Oral QPM  . cefTRIAXone (ROCEPHIN)  IV  2 g Intravenous Q12H  . [START ON 08/21/2014] darbepoetin (ARANESP) injection - NON-DIALYSIS  100 mcg Subcutaneous Q Fri-1800  . feeding supplement (ENSURE COMPLETE)  237 mL Oral BID BM  . fluconazole (DIFLUCAN) IV  200 mg Intravenous Q24H  . folic acid  2 mg Oral Daily  . insulin aspart  0-15 Units Subcutaneous TID WC  . insulin aspart  0-5 Units Subcutaneous QHS  . pantoprazole  40 mg Oral Daily  . pyridOXINE  100 mg Oral QPM  . vitamin B-12  1,000 mcg Oral QPM  . vitamin E  400 Units Oral Daily    Continuous Infusions:  PRN Meds:.acetaminophen, heparin, levalbuterol, ondansetron (ZOFRAN) IV, ondansetron (ZOFRAN) IV, ondansetron, promethazine, traMADol, zolpidem   Physical Exam: General appearance: alert, cooperative and no distress Heart: regular rate and rhythm Lungs: Dull in bases Extremities: No edema Wound: Clean and dry    Lab Results: CBC: Recent Labs  08/19/14 1130 08/29/2014 0440  WBC 4.8 5.2  HGB 9.5* 9.5*  HCT 29.6* 30.6*  PLT 132* PENDING   BMET:  Recent Labs  08/19/14 0435 08/14/2014 0440  NA 131* 133*  K 4.9 4.0  CL 93* 95*  CO2 24 25  GLUCOSE 73 93  BUN 49* 25*  CREATININE 3.06* 2.14*  CALCIUM 8.3* 8.0*    PT/INR:  Recent Labs  08/30/2014 0440  LABPROT 16.1*  INR 1.29   Radiology: Dg Chest Port 1 View  08/04/2014   CLINICAL DATA:  Pleural effusions.  EXAM: PORTABLE CHEST - 1 VIEW  COMPARISON:  August 19, 2014.  FINDINGS: Stable cardiomediastinal silhouette. Status post cardiac valve repair and coronary artery bypass graft. Right-sided pacemaker is unchanged in position. Left internal jugular dialysis catheter is noted with distal tip in the expected  position of the SVC. No pneumothorax is noted. Stable bilateral pleural effusions are noted with probable associated underlying atelectasis.  IMPRESSION: Stable support apparatus.  Stable bilateral pleural effusions.   Electronically Signed   By: Sabino Dick M.D.   On: 08/17/2014 07:43   Dg Chest Port 1 View  08/19/2014   CLINICAL DATA:  Follow-up of the fusions  EXAM: PORTABLE CHEST - 1 VIEW  COMPARISON:  Portable chest x-ray of August 18, 2014  FINDINGS: The lungs are adequately inflated. The pleural effusions have increased in conspicuity bilaterally. The pulmonary interstitial markings remain mildly increased. The cardiopericardial silhouette is top-normal in size. A left atrial appendage clip is present. A mitral valve ring is present. The patient has undergone previous CABG. The permanent  pacemaker is unchanged in position. The left internal jugular venous catheter tip lies overlies the midportion of the SVC  IMPRESSION: There is slight interval increase in the conspicuity of the bilateral pleural effusions. This may be at least in part due to the patient's semi erect positioning today as compared to yesterday's upright positioning. There mild pulmonary interstitial edema persists especially inferiorly.   Electronically Signed   By: David  Martinique   On: 08/19/2014 07:34   Dg Chest Port 1 View  08/18/2014   CLINICAL DATA:  Pleural effusions  EXAM: PORTABLE CHEST - 1 VIEW  COMPARISON:  August 17, 2014  FINDINGS: Pleural effusions bilaterally are stable. There is cardiomegaly with pulmonary vascularity within normal limits. The interstitium is mildly prominent, probably representing mild edema. There is no airspace consolidation.  Central catheter tip is in the superior cava. Pacemaker leads are attached to the right atrium right ventricle. There is evident of prosthetic cardiac valve. There is a left atrial clamp. No adenopathy. No pneumothorax.  IMPRESSION: Evidence of a degree of congestive heart failure. Effusions appear stable. No pneumothorax.   Electronically Signed   By: Lowella Grip M.D.   On: 08/18/2014 12:14     Assessment/Plan: S/P Procedure(s) (LRB): PERMANENT PACEMAKER INSERTION (N/A) CV- Acute diastolic CHF with RV failure, s/p PPM. AHF team managing. 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 Continue Amp/Ceftriaxone through 9/17, Fluconazole x 1 month.  Acute/chronic renal failure- HD yesterday with removal of around 2L, Cr down some this am.  Edema improved and wt down.  UOP picking up slightly, 585 ml documented over past 24 hours.  Continue care per renal. Thrombocytopenia- plts stable.  Continue to monitor.  Bilateral pleural effusions- not really improving with volume removal and pt more symptomatic.  Will likely benefit from  PleuRx catheter placement. Deconditioning- CRPI, PT.   COLLINS,GINA H 08/29/2014 8:23 AM   I have seen and examined the patient and agree with the assessment and plan as outlined.  Pleural effusions continue to re-accumulate and patient remains dyspneic with minimal activity.  Will proceed with bilateral Pleur-X catheter placement today.  I have reviewed the indications, risks and potential benefits with the patient.  All questions answered.  Heparin drip stopped at 6:30am this morning.  [name],[name] 08/19/2014 10:05 AM

## 2014-08-21 LAB — HEPARIN LEVEL (UNFRACTIONATED): HEPARIN UNFRACTIONATED: 0.3 [IU]/mL (ref 0.30–0.70)

## 2014-08-21 LAB — GLUCOSE, CAPILLARY
GLUCOSE-CAPILLARY: 92 mg/dL (ref 70–99)
Glucose-Capillary: 90 mg/dL (ref 70–99)
Glucose-Capillary: 227 mg/dL — ABNORMAL HIGH (ref 70–99)
Glucose-Capillary: 116 mg/dL — ABNORMAL HIGH (ref 70–99)

## 2014-08-21 LAB — RENAL FUNCTION PANEL
Albumin: 2 g/dL — ABNORMAL LOW (ref 3.5–5.2)
Anion gap: 12 (ref 5–15)
BUN: 36 mg/dL — ABNORMAL HIGH (ref 6–23)
CALCIUM: 8.2 mg/dL — AB (ref 8.4–10.5)
CO2: 25 meq/L (ref 19–32)
Chloride: 94 mEq/L — ABNORMAL LOW (ref 96–112)
Creatinine, Ser: 2.56 mg/dL — ABNORMAL HIGH (ref 0.50–1.35)
GFR calc Af Amer: 26 mL/min — ABNORMAL LOW (ref >90)
GFR calc non Af Amer: 22 mL/min — ABNORMAL LOW (ref >90)
GLUCOSE: 80 mg/dL (ref 70–99)
Phosphorus: 4.2 mg/dL (ref 2.3–4.6)
Potassium: 4.4 mEq/L (ref 3.7–5.3)
SODIUM: 131 meq/L — AB (ref 137–147)

## 2014-08-21 LAB — CBC
HCT: 30.7 % — ABNORMAL LOW (ref 39.0–52.0)
HEMOGLOBIN: 9.7 g/dL — AB (ref 13.0–17.0)
MCH: 31.1 pg (ref 26.0–34.0)
MCHC: 31.6 g/dL (ref 30.0–36.0)
MCV: 98.4 fL (ref 78.0–100.0)
Platelets: 96 10*3/uL — ABNORMAL LOW (ref 150–400)
RBC: 3.12 MIL/uL — AB (ref 4.22–5.81)
RDW: 22.1 % — ABNORMAL HIGH (ref 11.5–15.5)
WBC: 6 10*3/uL (ref 4.0–10.5)

## 2014-08-21 LAB — RETICULOCYTES
RBC.: 3.12 MIL/uL — ABNORMAL LOW (ref 4.22–5.81)
Retic Count, Absolute: 159.1 10*3/uL (ref 19.0–186.0)
Retic Ct Pct: 5.1 % — ABNORMAL HIGH (ref 0.4–3.1)

## 2014-08-21 LAB — PROTIME-INR
INR: 1.31 (ref 0.00–1.49)
Prothrombin Time: 16.3 seconds — ABNORMAL HIGH (ref 11.6–15.2)

## 2014-08-21 LAB — RHEUMATOID FACTORS, FLUID: Cortisol #1 (Base): NEGATIVE

## 2014-08-21 NOTE — Progress Notes (Signed)
Patient has been discussed with RNCM; possible referral to LTAC due to severity is patient's condition at this time.  CSW will continue to monitor for appropriate disposition.  RNCM will follow up with MD re: above.   Justin [name]. Justin [name], [place]  .

## 2014-08-21 NOTE — Progress Notes (Signed)
ANTICOAGULATION CONSULT NOTE - Follow Up Consult  Pharmacy Consult for Heparin Indication: atrial fibrillation, recent MVR  No Known Allergies  Patient Measurements: Height: 5\' 11"  (180.3 cm) Weight: 166 lb 14.2 oz (75.7 kg) IBW/kg (Calculated) : 75.3  Vital Signs: Temp: 98.4 F (36.9 C) (09/18 1125) Temp src: Oral (09/18 1125) BP: 100/52 mmHg (09/18 1125)  Labs:  Recent Labs  08/19/14 0435 08/19/14 1130 08/13/2014 0440 08/21/14 0308 08/21/14 0309  HGB 9.7* 9.5* 9.5* 9.7*  --   HCT 30.7* 29.6* 30.6* 30.7*  --   PLT 125* 132* 78* 96*  --   LABPROT 16.0*  --  16.1* 16.3*  --   INR 1.28  --  1.29 1.31  --   HEPARINUNFRC 0.47  --  0.59  --  0.30  CREATININE 3.06*  --  2.14*  --  2.56*    Estimated Creatinine Clearance: 24.9 ml/min (by C-G formula based on Cr of 2.56).   Medical History: Past Medical History  Diagnosis Date  . CAD (coronary artery disease) prior stenting 1999   . Dyslipidemia   . HTN (hypertension)   . WPW (Wolff-Parkinson-White syndrome) loss of preexcitation   . Atrial fibrillation   . AV block, Mobitz II   . Presyncope   . Myocardial infarction   . Pacemaker 04/07/2013  . Arthritis   . History of chicken pox   . Anemia   . Hypernatremia 06/03/2013  . Thrombocytopenia, unspecified 06/03/2013  . Leukopenia 06/03/2013  . Obstructive sleep apnea 06/03/2013    USES CPAP  . Urinary incontinence 06/03/2013  . Hyperglycemia 12/21/2013  . Pancytopenia 06/03/2013  . Iron deficiency anemia, unspecified 07/02/2014  . Malabsorption of iron 07/02/2014  . Hypotestosteronemia 07/02/2014  . ITP (idiopathic thrombocytopenic purpura) 07/02/2014    possible - although patient had bacterial endocarditis at the time of diagnosis  . Aortic insufficiency   . Chronic kidney disease   . Bacterial endocarditis      Enterococcus sepsis with aortic valve vegetation  . UTI (urinary tract infection) 07/21/2014    ENTEROBACTER CLOACAE   . S/P aortic valve and mitral valve  replacement 07/12/2014    21 mm Medtronic Freestyle porcine aortic root graft 29 mm [facility] Mitral bovine bioprosthetic mitral valve   . S/P aortic valve replacement with stentless valve 07/22/2014    21 mm Medtronic Freestyle porcine aortic root graft with reimplantation of left main and right coronary arteries  . S/P mitral valve replacement with bioprosthetic valve 07/27/2014    29 mm [facility] Mitral bovine bioprosthetic tissue valve  . S/P CABG x 1 07/17/2014    SVG to OM1 with EVH via right thigh  . S/P Maze operation for atrial fibrillation 07/12/2014    Complete bilateral atrial lesion set using cryothermy and bipolar radiofrequency ablation with clipping of LA appendage  . Acute renal failure superimposed on stage 3 chronic kidney disease 07/27/2014  . Acute on chronic diastolic heart failure 0/73/7106  . Pleural effusions, bilateral, recurrent     Medications:  Prescriptions prior to admission  Medication Sig Dispense Refill  . atorvastatin (LIPITOR) 10 MG tablet Take 1 tablet (10 mg total) by mouth every evening.  90 tablet  3  . Azelaic Acid (FINACEA) 15 % cream Apply 1 application topically daily. After skin is thoroughly washed and patted dry, gently but thoroughly massage a thin film of azelaic acid creame      . Cholecalciferol (VITAMIN D-3) 1000 UNITS CAPS Take 1 capsule by mouth  daily.      . furosemide (LASIX) 20 MG tablet Take 3 tablets (60 mg total) by mouth daily.  90 tablet  12  . losartan (COZAAR) 50 MG tablet Take 1 tablet (50 mg total) by mouth daily.  90 tablet  3  . Multiple Vitamin (MULTIVITAMIN WITH MINERALS) TABS Take 1 tablet by mouth 2 (two) times a week.       . nadolol (CORGARD) 20 MG tablet Take 10 mg by mouth daily.      . nitroGLYCERIN (NITROSTAT) 0.4 MG SL tablet Place 1 tablet (0.4 mg total) under the tongue every 5 (five) minutes as needed for chest pain. x3 doses as needed for chest pain  25 tablet  12  . omeprazole (PRILOSEC) 20 MG capsule Take  20 mg by mouth daily as needed (for heartburn).       Vladimir Faster Glycol-Propyl Glycol (SYSTANE ULTRA OP) Apply 1 drop to eye 4 (four) times daily as needed (for dry eyes).      . predniSONE (DELTASONE) 20 MG tablet Take 80 mg by mouth daily with breakfast.      . Probiotic Product (PROBIOTIC DAILY PO) Take 1 capsule by mouth daily.       Marland Kitchen pyridOXINE (VITAMIN B-6) 100 MG tablet Take 50 mg by mouth every evening.       . tamsulosin (FLOMAX) 0.4 MG CAPS capsule Take 0.4 mg by mouth at bedtime.      . vitamin B-12 (CYANOCOBALAMIN) 1000 MCG tablet Take 1,000 mcg by mouth every evening.      . Vitamin E (VITA-PLUS E PO) Take 1 capsule by mouth daily.          Assessment: 80 yo M admitted 07/26/2014 with SOB, OHS 8/21: porcine aortic root/valve, Pericardial tissue MVR, CABG x 1, Maze pacer extraction. Post op sepsis with thrombocytopenia and ARF.  Pharmacy consulted to renally adjust antibiotics & resume anticoagulation for afib 9/14. S/p 5 days IVIG + Nplate x 1 on 9/7, Aranesp qFri; 2/2 acute illness/h/o ITP  Coumadin on hold since 9/5 by Dr. Marin Olp; started on hep gtt on 9/14, now therapeutic on 1000 units/hr. Plts trending down again, now at 96; LDH elev, now vpaced; H/H 9.7 but stable, after 2u PRBC on 9/8. Noted bleeding from Pleur-X catheter site so will hold heparin for at least 24 hour until no further bleeding noted.    Goal of Therapy:  Heparin level 0.3-0.7 units/ml Monitor platelets by anticoagulation protocol: Yes Renal adjustment of antibiotics   Plan:  - Hold heparin gtt - F/u restart in am   Thank you for allowing pharmacy to be a part of this patient's care team. Harolyn Rutherford, PharmD Clinical Pharmacist - Resident Pager: [phone] Pharmacy: [phone] 08/21/2014 1:16 PM

## 2014-08-21 NOTE — Progress Notes (Signed)
CARDIAC REHAB PHASE I   PRE:  Rate/Rhythm: 86 PAcing  BP:  Supine: 90/63  Sitting: 85/49  Standing: 100/32   SaO2: 100 2L  MODE:  Ambulation: 100 ft   POST:  Rate/Rhythm: 92 Pacing  BP:  Supine:  Sitting: 99/49  Standing:    SaO2: 95 2L [id number] Assisted X  2 used O2 2L, walker and gait belt to ambulate. Pt weak with standing and leaned backward. Pt denies any dizziness with standing.His legs are very wobbly and he is jittery. He was able to walk 100 feet. Pt c/o of shooting pains in his chest and back, question spasms. Pt to recliner after walk with call light in reach. Deon Pilling, RN 08/21/2014 9:35 AM    Rodney Langton RN 08/21/2014 9:29 AM

## 2014-08-21 NOTE — Progress Notes (Signed)
Subjective:   CVVHD stopped (9/14). Underwent HD 9/16. Yesterday L and R pleurx catheters placed with R = 1600 cc out and L  1250 cc out. Weight stable and 24 hr I/O - 350 cc. Renal function trending up.   Finished ampicillic /cetriaxone for enterrococcus (9/17) and will remain on fluconazole due to Candida parapsilosis fungemia/funguira.  Echo 9/11: EF 35-40%. No obvious vegetation on TTE  Remains weak. SOB improved.   Objective:   Scheduled Meds: . antiseptic oral rinse  7 mL Mouth Rinse BID  . aspirin EC  81 mg Oral Daily  . atorvastatin  10 mg Oral QPM  . darbepoetin (ARANESP) injection - NON-DIALYSIS  100 mcg Subcutaneous Q Fri-1800  . feeding supplement (ENSURE COMPLETE)  237 mL Oral BID BM  . fluconazole (DIFLUCAN) IV  200 mg Intravenous Q24H  . folic acid  2 mg Oral Daily  . insulin aspart  0-15 Units Subcutaneous TID WC  . insulin aspart  0-5 Units Subcutaneous QHS  . pantoprazole  40 mg Oral Daily  . pyridOXINE  100 mg Oral QPM  . vitamin B-12  1,000 mcg Oral QPM  . vitamin E  400 Units Oral Daily   Continuous Infusions: . heparin 1,000 Units/hr (08/31/2014 1645)   PRN Meds:.acetaminophen, heparin, levalbuterol, ondansetron (ZOFRAN) IV, ondansetron (ZOFRAN) IV, ondansetron, promethazine, traMADol, zolpidem  Filed Vitals:   08/21/14 0300 08/21/14 0400 08/21/14 0500 08/21/14 0600  BP: 126/65 130/59 125/76 137/63  Pulse:      Temp:      TempSrc:      Resp: 25 21 15 29   Height:      Weight:   166 lb 14.2 oz (75.7 kg)   SpO2: 97% 100% 100% 100%    Intake/Output from previous day:  Intake/Output Summary (Last 24 hours) at 08/21/14 7858 Last data filed at 08/21/14 0600  Gross per 24 hour  Intake  842.5 ml  Output   3400 ml  Net -2557.5 ml    Physical Exam:  General: Comfortable, sitting up in bed Skin is warm and dry.  HEENT is normal.  Neck is supple. JVP difficult to assess but appears 10; LIJ trialysis cath Chest clear. Pacemaker site with no  hematoma Lungs: diminished in the bases R>L Cardiovascular exam is regular rate and rhythm. 2/6 systolic murmur Abdominal exam nontender or distended. No masses palpated. Extremities show . no edema. TED hose in place. neuro grossly intact  V pacing   Lab Results: Basic Metabolic Panel:  Recent Labs  08/30/2014 0440 08/21/14 0309  NA 133* 131*  K 4.0 4.4  CL 95* 94*  CO2 25 25  GLUCOSE 93 80  BUN 25* 36*  CREATININE 2.14* 2.56*  CALCIUM 8.0* 8.2*  PHOS 4.0 4.2   CBC:  Recent Labs  08/11/2014 0440 08/21/14 0308  WBC 5.2 6.0  HGB 9.5* 9.7*  HCT 30.6* 30.7*  MCV 99.4 98.4  PLT 78* 96*     Assessment/Plan:  1. Status post aortic root replacement, bioprosthetic aortic valve replacement, bioprosthetic mitral valve replacement, coronary artery bypass graft (to LCx system) and maze. 2. Acute on chronic kidney failure-nephrology following.  3. Thrombocytopenia-improved. ITP.  Hematology following. 4. Paroxysmal atrial fibrillation- 5. CAD-continue aspirin and statin. Had SVG-LCx system with recent cardiac surgery.  6. 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 8. Acute diastolic CHF with prominent RV failure.  EF 50-55%-> 35-40%.  9. Acute respiratory failure 10. Fungemia/funguria  11. Pleural effusions: s/p thoracentesis on right.   PLAN/DISCUSSION   CVVHD stopped (9/14) and underwent HD (9/16). Renal function continues to trend up, however weight stable. 24 hr I/O -350 cc.  Still major concern here is if his kidneys will recover. Volume status slightly elevated and will continue to assess breathing.   Agree with Dr. Roxy Manns that he likely is not good TEE candidate currently and probably wouldn't change our management at this point as he is not a re-operative candidate. No obvious vegetations on TTE. Today is last day of abx but will continue on antifungals for at least a month. Appreciate IDs help.   SBP  stable 112-130s will not titrate any medications in order to allow kidneys perfusion. Underwent pleurx catheter placement yesterday bilaterally for pleural effusions. Heparin gtt back on.   ICD interrogated (9/14) and V pacing with SR majority of the time with about 12% of afib. He is now off amiodarone. Will continue to follow.   Junie Bame B NP-C 7:02 AM  Patient seen and examined with Junie Bame, NP. We discussed all aspects of the encounter. I agree with the assessment and plan as stated above.   Improving steadily. Now off milrinone. Breathing better after Pleurex tubes placed. Making some urine though creatinine trending back up. Would hold off on ACE-I or b-blocker due to renal failure and recent low output.  Anti-infective agents per ID.   Will continue to follow. Continue heparin.   Deunta Beneke,MD 7:27 AM

## 2014-08-21 NOTE — Progress Notes (Signed)
[address]       [place] [address]             [phone]        CARDIOTHORACIC SURGERY PROGRESS NOTE  R28 Days Post-Op Procedure(s) (LRB):  ROOT REPLACEMENT WITH BIOPROSTHETIC PORCINE AORTIC ROOT REIMPLANTATION OF LEFT MAIN AND RIGHT CORONARY ARTERIES (N/A)  INTRAOPERATIVE TRANSESOPHAGEAL ECHOCARDIOGRAM (N/A)  MAZE (N/A)  CORONARY ARTERY BYPASS GRAFTING (CABG), on pump, times one, using right greater saphenous vein harvested endoscopically. (N/A)  MITRAL VALVE (MV) REPLACEMENT (N/A)   21 Days Post-Op  Procedure(s) (LRB):  PERMANENT PACEMAKER INSERTION (N/A)   R1 Day Post-Op Procedure(s) (LRB): INSERTION PLEURAL DRAINAGE CATHETER (Bilateral)  Subjective: Feels much better.  Wants to try walking today.  Developed significant bloody drainage around left Pleur-X catheter after heparin started yesterday  Objective: Vital signs: BP Readings from Last 1 Encounters:  08/21/14 112/57   Pulse Readings from Last 1 Encounters:  08/10/2014 78   Resp Readings from Last 1 Encounters:  08/21/14 19   Temp Readings from Last 1 Encounters:  08/21/14 98.3 F (36.8 C) Oral    Hemodynamics:    Physical Exam:  Rhythm:   Afib  Breath sounds: clear  Heart sounds:  irreg  Incisions:  Left Pleur-x catheter dressing saturated with blood  Abdomen:  soft  Extremities:  Warm, no edema   Intake/Output from previous day: 09/17 0701 - 09/18 0700 In: 852.5 [I.V.:652.5; IV Piggyback:200] Out: 3400 [Urine:350] Intake/Output this shift:    Lab Results:  CBC: Recent Labs  08/12/2014 0440 08/21/14 0308  WBC 5.2 6.0  HGB 9.5* 9.7*  HCT 30.6* 30.7*  PLT 78* 96*    BMET:  Recent Labs  08/05/2014 0440 08/21/14 0309  NA 133* 131*  K 4.0 4.4  CL 95* 94*  CO2 25 25  GLUCOSE 93 80  BUN 25* 36*  CREATININE 2.14* 2.56*  CALCIUM 8.0* 8.2*     CBG (last 3)   Recent Labs  08/18/2014 1148 08/19/2014 1612 08/08/2014 2217  GLUCAP 85 67* 133*    ABG      Component Value Date/Time   PHART 7.395 07/25/2014 2059   PCO2ART 31.8* 07/25/2014 2059   PO2ART 65.0* 07/25/2014 2059   HCO3 19.5* 07/25/2014 2059   TCO2 20 07/25/2014 2059   ACIDBASEDEF 5.0* 07/25/2014 2059   O2SAT 66.3 08/21/2014 0435    CXR: PORTABLE CHEST - 1 VIEW  COMPARISON: 08/21/2014  FINDINGS:  Cardiomegaly, cardiac valve replacement changes, right-sided  pacemaker and left IJ central venous catheter with tip overlying the  mid SVC again noted.  Bilateral thoracostomy tubes are now noted with resolution of  bilateral pleural effusions. There is no evidence of pneumothorax.  Significantly improved bilateral lower lung aeration noted.  Pulmonary vascular congestion again noted.  IMPRESSION:  Bilateral thoracostomy tube/catheter placement with interval  resolution of bilateral pleural effusions and significantly improved  bilateral lower lung aeration. No evidence of pneumothorax.  Cardiomegaly and pulmonary vascular congestion.  Electronically Signed  By: Hassan Rowan M.D.  On: 08/10/2014 15:18   Assessment/Plan:  Overall improving but remains in oliguric renal failure. Hemodynamics stable off milrinone for several days.   Rhythm primarily sinus/paced w/ some intermittent rate-controlled Afib Will hold heparin at least 24 hours until no further bleeding from Pleur-X catheter site. Plan for HD per Nephrology team - should we place tunneled dialysis catheter?  If not we will need to start coumadin eventually. Need to work on mobility,  PT  Justin [name] H 08/21/2014 7:47 AM

## 2014-08-21 NOTE — Progress Notes (Signed)
Physical Therapy Treatment Patient Details Name: Justin [name] MRN: [id number] DOB: 01-01-1933 Today's Date: 08/21/2014    History of Present Illness Pt is a 80 y.o. male with history of CAD status post stents, hypertension, dyslipidemia, a-fib, pacemaker, ITP currently being treated with IVIG, who presents to the emergency department with complaints of fever and shortness of breath. 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 Pt underwent pacer placement on 8/28. Placed on CRRT 9/8-9/13.    PT Comments    Pt encouraged by his progress and eager to regain his strength and mobility. Slightly limited by pain today due to recent bil pleurex catheter placement. Pt also intermittently experiencing "stabbing," fleeting pain in Rt posterior thorax during activity.  Encouraged to continue to perform AROM exercises throughout the day and handout provided.    Follow Up Recommendations  CIR     Equipment Recommendations  Rolling walker with 5" wheels    Recommendations for Other Services       Precautions / Restrictions Precautions Precautions: Sternal;Fall;ICD/Pacemaker Precaution Comments: bil pleurex catheters Restrictions Weight Bearing Restrictions: No    Mobility  Bed Mobility Overal bed mobility: Needs Assistance Bed Mobility: Supine to Sit     Supine to sit: Mod assist;HOB elevated     General bed mobility comments: unable to roll due to pain from recent pleurex tubes placed; required incr assist to come up from supine due to weakness  Transfers Overall transfer level: Needs assistance Equipment used: Rolling walker (2 wheeled) Transfers: Sit to/from Stand Sit to Stand: +2 physical assistance;Min assist;From elevated surface         General transfer comment: from ICU bed (tall even at lowest height); required assist to control descent into normal height chair  Ambulation/Gait Ambulation/Gait assistance:  Min assist;+2 safety/equipment Ambulation Distance (Feet): 100 Feet Assistive device: Rolling walker (2 wheeled) Gait Pattern/deviations: Step-through pattern;Decreased stride length;Trunk flexed Gait velocity: Decreased   General Gait Details: vc for safe use of RW and upright posture; very slow cadence; vc and occasional assist to maneuver RW   Stairs            Wheelchair Mobility    Modified Rankin (Stroke Patients Only)       Balance                                    Cognition Arousal/Alertness: Awake/alert Behavior During Therapy: WFL for tasks assessed/performed Overall Cognitive Status: Within Functional Limits for tasks assessed                      Exercises Other Exercises Other Exercises: provided handout for general exercises (supine and sitting)    General Comments        Pertinent Vitals/Pain Pain Assessment: 0-10 Pain Score: 3  Pain Location: Rt and Lt sides Pain Intervention(s): Limited activity within patient's tolerance;Repositioned;Monitored during session     Home Living                      Prior Function            PT Goals (current goals can now be found in the care plan section) Acute Rehab PT Goals Patient Stated Goal: To return home with wife PT Goal Formulation: With patient Time For Goal Achievement: 08/31/14 Potential to Achieve Goals: Good Progress towards PT goals: Progressing toward  goals    Frequency  Min 3X/week    PT Plan Current plan remains appropriate    Co-evaluation             End of Session Equipment Utilized During Treatment: Gait belt;Oxygen Activity Tolerance: Patient tolerated treatment well Patient left: in chair;with call bell/phone within reach;with family/visitor present     Time: 0454-0981 PT Time Calculation (min): 23 min  Charges:  $Gait Training: 8-22 mins $Therapeutic Activity: 8-22 mins                    G Codes:       Jawann Urbani 2014/08/30, 5:20 PM Pager [phone]

## 2014-08-21 NOTE — Progress Notes (Addendum)
Subjective:   Pleurx tubes yesterday with 3 liters total pleural fluid drained Only 350 of urine and creatinine still trending up since dialysis on 9/16  Objective Vital signs in last 24 hours: Filed Vitals:   08/21/14 0500 08/21/14 0600 08/21/14 0700 08/21/14 0730  BP: 125/76 137/63 112/57   Pulse:      Temp:    98.3 F (36.8 C)  TempSrc:    Oral  Resp: 15 29 19    Height:      Weight: 75.7 kg (166 lb 14.2 oz)     SpO2: 100% 100% 96%    Weight change: -2.5 kg (-5 lb 8.2 oz)  Intake/Output Summary (Last 24 hours) at 08/21/14 0855 Last data filed at 08/21/14 0700  Gross per 24 hour  Intake  852.5 ml  Output   3400 ml  Net -2547.5 ml  (urine 350)  Weight Trending 08/21/14 0500 75.7 kg (166 lb 14.2 oz) 08/19/14 0500 79.7 kg (175 lb 11.3 oz) 08/18/14 0339 77.8 kg (171 lb 8.3 oz)  (79.1 on standup scales) 08/17/14 0500 75.3 kg (166 lb 0.1 oz)   CRRT discontinued 08/16/14 0500 79.3 kg (174 lb 13.2 oz) 08/15/14 0446 84.3 kg (185 lb 13.6 oz) 08/14/14 0255 89.4 kg (197 lb 1.5 oz)  08/13/14 0500 89.2 kg (196 lb 10.4 oz) 08/12/14 0413 100.2 kg (220 lb 14.4 oz) (CRRT initiated 9/8)  Physical Exam: General: Very nice thin WM NAD  Breathing much better Heart:S1S2 No def s3  Lungs: Improved air movement Abdomen: non-distended, non-tender Extremities: Trace if any peripheral edema Access- Vas cath (placed 9/11) in left IJ position  Basic Metabolic Panel:  Recent Labs  08/06/2014 0440 08/21/14 0309  NA 133* 131*  K 4.0 4.4  CL 95* 94*  CO2 25 25  GLUCOSE 93 80  BUN 25* 36*  CREATININE 2.14* 2.56*  CALCIUM 8.0* 8.2*  PHOS 4.0 4.2     Recent Labs  08/18/2014 0440 08/21/14 0309  ALBUMIN 2.0* 2.0*   CBC:  Recent Labs  08/27/2014 0440 08/21/14 0308  WBC 5.2 6.0  HGB 9.5* 9.7*  HCT 30.6* 30.7*  MCV 99.4 98.4  PLT 78* 96*    Recent Labs  08/21/14 0308  RETICCTPCT 5.1*   Studies/Results: Dg Chest Port 1 View  08/17/2014   CLINICAL DATA:  Follow-up of  respiratory failure ; shortness of breath  EXAM: PORTABLE CHEST - 1 VIEW  COMPARISON:  Portable chest x-ray of August 15, 2014  FINDINGS: The lungs are adequately inflated. There are small bilateral pleural effusions greater on the right than on the left. There is no alveolar infiltrate. The cardiopericardial silhouette is top-normal in size. A valve ring is in place in the mitral position. A left atrial appendage clip is present. The patient has undergone previous CABG. A left internal jugular venous catheter tip lies in the proximal SVC and is stable. The permanent pacemaker is unchanged in position. The observed portions of the bony thorax are unremarkable.  IMPRESSION: There has been mild interval improvement in the appearance of the pulmonary interstitium suggesting resolving pulmonary edema. There remain small bilateral pleural effusions greater on the right than on the left.   Electronically Signed   By: David  Martinique     9/16 There is slight interval increase in the conspicuity of the  bilateral pleural effusions. This may be at least in part due to the  patient's semi erect positioning today as compared to yesterday's  upright positioning. There mild pulmonary interstitial  edema  persists especially inferiorly.  9/17 Bilateral thoracostomy tube/catheter placement with interval  resolution of bilateral pleural effusions and significantly improved  bilateral lower lung aeration. No evidence of pneumothorax.  Cardiomegaly and pulmonary vascular congestion     Scheduled Medications: . antiseptic oral rinse  7 mL Mouth Rinse BID  . aspirin EC  81 mg Oral Daily  . atorvastatin  10 mg Oral QPM  . darbepoetin (ARANESP) injection - NON-DIALYSIS  100 mcg Subcutaneous Q Fri-1800  . feeding supplement (ENSURE COMPLETE)  237 mL Oral BID BM  . fluconazole (DIFLUCAN) IV  200 mg Intravenous Q24H  . folic acid  2 mg Oral Daily  . insulin aspart  0-15 Units Subcutaneous TID WC  . insulin aspart  0-5  Units Subcutaneous QHS  . pantoprazole  40 mg Oral Daily  . pyridOXINE  100 mg Oral QPM  . vitamin B-12  1,000 mcg Oral QPM  . vitamin E  400 Units Oral Daily   Background Pt is a 80 y.o. yo male who was admitted on 08/01/2014 with A on CRF (baseline around 1.5) in setting of enterococcal endocarditis, s/p complicated cardiac surgery, pressor dependent hypotension and subsequent candidemia  Plan AKI on CKD d/t ATN -  CRRT  9/8 - 9/14 with reduction in weight by around 25 kg (was massively overloaded).  Tolerated intermittent HD 9/16. Creatinine continues to slowly rise since his HD on 9/16. Remains without absolute indication today but if creatinine has risen further tomorrow, will plan for HD tomorrow. I would recommend replacement of the temporary cath with a tunnelled catheter first of the week unless he opens up over the weekend.  Anemia- Aranesp 100 QWed. Also s/p transfusion- stable Cardiac -s/p Ao root replacement, AoVR, MVR, CABG and MAZE procedure/PAF - per cards and TCTS.  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  Bilateral pleural effusions - s/p pleurex tubes bilat 9/17  Jamal Maes, MD [facility] [phone] Pager 08/21/2014, 8:55 AM

## 2014-08-21 NOTE — Progress Notes (Signed)
Left pluer-x catheter insertion site oozing serosanguinous drainage. Dressing saturated and reinforced three times throughout the night.

## 2014-08-22 LAB — RENAL FUNCTION PANEL
ALBUMIN: 1.9 g/dL — AB (ref 3.5–5.2)
ANION GAP: 13 (ref 5–15)
BUN: 45 mg/dL — ABNORMAL HIGH (ref 6–23)
CHLORIDE: 96 meq/L (ref 96–112)
CO2: 24 mEq/L (ref 19–32)
Calcium: 8.2 mg/dL — ABNORMAL LOW (ref 8.4–10.5)
Creatinine, Ser: 2.68 mg/dL — ABNORMAL HIGH (ref 0.50–1.35)
GFR calc Af Amer: 24 mL/min — ABNORMAL LOW (ref >90)
GFR, EST NON AFRICAN AMERICAN: 21 mL/min — AB (ref >90)
Glucose, Bld: 90 mg/dL (ref 70–99)
PHOSPHORUS: 4.2 mg/dL (ref 2.3–4.6)
POTASSIUM: 4.7 meq/L (ref 3.7–5.3)
Sodium: 133 mEq/L — ABNORMAL LOW (ref 137–147)

## 2014-08-22 LAB — GLUCOSE, CAPILLARY
GLUCOSE-CAPILLARY: 111 mg/dL — AB (ref 70–99)
Glucose-Capillary: 86 mg/dL (ref 70–99)
Glucose-Capillary: 152 mg/dL — ABNORMAL HIGH (ref 70–99)
Glucose-Capillary: 164 mg/dL — ABNORMAL HIGH (ref 70–99)

## 2014-08-22 LAB — CBC
HCT: 29.1 % — ABNORMAL LOW (ref 39.0–52.0)
Hemoglobin: 9.3 g/dL — ABNORMAL LOW (ref 13.0–17.0)
MCH: 31.3 pg (ref 26.0–34.0)
MCHC: 32 g/dL (ref 30.0–36.0)
MCV: 98 fL (ref 78.0–100.0)
Platelets: 74 10*3/uL — ABNORMAL LOW (ref 150–400)
RBC: 2.97 MIL/uL — ABNORMAL LOW (ref 4.22–5.81)
RDW: 21.5 % — AB (ref 11.5–15.5)
WBC: 5.6 10*3/uL (ref 4.0–10.5)

## 2014-08-22 LAB — RETICULOCYTES
RBC.: 2.97 MIL/uL — ABNORMAL LOW (ref 4.22–5.81)
RETIC COUNT ABSOLUTE: 133.7 10*3/uL (ref 19.0–186.0)
RETIC CT PCT: 4.5 % — AB (ref 0.4–3.1)

## 2014-08-22 LAB — PROTIME-INR
INR: 1.13 (ref 0.00–1.49)
Prothrombin Time: 14.5 seconds (ref 11.6–15.2)

## 2014-08-22 NOTE — Progress Notes (Signed)
Subjective:   Pleurx tubes 9/17 Last HD 9/16 Creatinine trending up but rate of rise is slower Making some urine  Objective Vital signs in last 24 hours: Filed Vitals:   08/22/14 0400 08/22/14 0500 08/22/14 0600 08/22/14 0732  BP: 115/69 127/65 132/60 122/68  Pulse:      Temp:    97.9 F (36.6 C)  TempSrc:    Oral  Resp: 21 13 27 16   Height:      Weight:      SpO2: 100% 100% 99% 100%   Weight change: -0.8 kg (-1 lb 12.2 oz)  Intake/Output Summary (Last 24 hours) at 08/22/14 0813 Last data filed at 08/22/14 0600  Gross per 24 hour  Intake    580 ml  Output   1025 ml  Net   -445 ml  (urine 350)  Weight Trending 08/22/14 0301 74.9 kg (165 lb 2 oz) 08/21/14 0500 75.7 kg (166 lb 14.2 oz) 08/19/14 0500 79.7 kg (175 lb 11.3 oz)  Hemodialysis 4 hours 08/18/14 0339 77.8 kg (171 lb 8.3 oz)  (79.1 on standup scales) 08/17/14 0500 75.3 kg (166 lb 0.1 oz)   CRRT discontinued 08/16/14 0500 79.3 kg (174 lb 13.2 oz) 08/15/14 0446 84.3 kg (185 lb 13.6 oz) 08/14/14 0255 89.4 kg (197 lb 1.5 oz)  08/13/14 0500 89.2 kg (196 lb 10.4 oz) 08/12/14 0413 100.2 kg (220 lb 14.4 oz) (CRRT initiated 9/8)  Physical Exam: General: Very nice thin WM NAD  Breathing much better Heart:S1S2 No def s3  Lungs: Improved air movement Abdomen: non-distended, non-tender Extremities: Trace if any peripheral edema Access- Vas cath ( 9/11) in left IJ position  Basic Metabolic Panel:  Recent Labs  08/21/14 0309 08/22/14 0311  NA 131* 133*  K 4.4 4.7  CL 94* 96  CO2 25 24  GLUCOSE 80 90  BUN 36* 45*  CREATININE 2.56* 2.68*  CALCIUM 8.2* 8.2*  PHOS 4.2 4.2     Recent Labs  08/21/14 0309 08/22/14 0311  ALBUMIN 2.0* 1.9*   CBC:  Recent Labs  08/21/14 0308 08/22/14 0311  WBC 6.0 5.6  HGB 9.7* 9.3*  HCT 30.7* 29.1*  MCV 98.4 98.0  PLT 96* 74*    Recent Labs  08/22/14 0311  RETICCTPCT 4.5*   Studies/Results: Dg Chest Port 1 View  08/17/2014   CLINICAL DATA:  Follow-up of  respiratory failure ; shortness of breath  EXAM: PORTABLE CHEST - 1 VIEW  COMPARISON:  Portable chest x-ray of August 15, 2014  FINDINGS: The lungs are adequately inflated. There are small bilateral pleural effusions greater on the right than on the left. There is no alveolar infiltrate. The cardiopericardial silhouette is top-normal in size. A valve ring is in place in the mitral position. A left atrial appendage clip is present. The patient has undergone previous CABG. A left internal jugular venous catheter tip lies in the proximal SVC and is stable. The permanent pacemaker is unchanged in position. The observed portions of the bony thorax are unremarkable.  IMPRESSION: There has been mild interval improvement in the appearance of the pulmonary interstitium suggesting resolving pulmonary edema. There remain small bilateral pleural effusions greater on the right than on the left.   Electronically Signed   By: David  Martinique     9/16 There is slight interval increase in the conspicuity of the  bilateral pleural effusions. This may be at least in part due to the  patient's semi erect positioning today as compared to yesterday's  upright positioning. There mild pulmonary interstitial edema  persists especially inferiorly.  9/17 Bilateral thoracostomy tube/catheter placement with interval  resolution of bilateral pleural effusions and significantly improved  bilateral lower lung aeration. No evidence of pneumothorax.  Cardiomegaly and pulmonary vascular congestion     Scheduled Medications: . antiseptic oral rinse  7 mL Mouth Rinse BID  . aspirin EC  81 mg Oral Daily  . atorvastatin  10 mg Oral QPM  . darbepoetin (ARANESP) injection - NON-DIALYSIS  100 mcg Subcutaneous Q Fri-1800  . feeding supplement (ENSURE COMPLETE)  237 mL Oral BID BM  . fluconazole (DIFLUCAN) IV  200 mg Intravenous Q24H  . folic acid  2 mg Oral Daily  . insulin aspart  0-15 Units Subcutaneous TID WC  . insulin aspart  0-5  Units Subcutaneous QHS  . pantoprazole  40 mg Oral Daily  . pyridOXINE  100 mg Oral QPM  . vitamin B-12  1,000 mcg Oral QPM  . vitamin E  400 Units Oral Daily   Background Pt is a 80 y.o. yo male who was admitted on 08/01/2014 with A on CRF (baseline around 1.5) in setting of enterococcal endocarditis, s/p complicated cardiac surgery (AoVR, MVR, CABG, MAZE), pressor dependent hypotension and subsequent candidemia; Renal replacement therapy 9/8-9/14. HD 9/16.  Plan AKI on CKD d/t ATN -  CRRT  9/8 - 9/14 with reduction in weight by 25 kg (was massively overloaded).  HD 9/16. Creatinine continues to slowly rise since his HD on 9/16 but rate of rise slowing. Weight stable, K and acid base are fine. Remains without absolute indication for HD and I am inclined to continue to watch today. May still want to replace temp cath with tunnelled cath first of week but will withhold that final recommendation pending how his renal function does through the weekend. (I too would like to get the current temp catheter out in a timely fashion)                             Anemia- Aranesp 100 QWed. S/p feraheme 8/27 Also s/p transfusion- stable Cardiac -s/p Ao root replacement, AoVR, MVR, CABG and MAZE procedure/PAF - per cards and TCTS.  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  Bilateral pleural effusions - s/p pleurex tubes bilat 9/17  Jamal Maes, MD [facility] [phone] Pager 08/22/2014, 8:13 AM

## 2014-08-22 NOTE — Progress Notes (Addendum)
TCTS DAILY ICU PROGRESS NOTE                   [address]            [place] [id number]          [phone]   2 Days Post-Op Procedure(s) (LRB): INSERTION PLEURAL DRAINAGE CATHETER (Bilateral)  Total Length of Stay:  LOS: 47 days   Subjective: Feels a little better today, breathing stable,.  Objective: Vital signs in last 24 hours: Temp:  [97.9 F (36.6 C)-98.6 F (37 C)] 97.9 F (36.6 C) (09/19 0732) Pulse Rate:  [79-82] 79 (09/19 0300) Cardiac Rhythm:  [-] Ventricular paced (09/19 0400) Resp:  [13-35] 16 (09/19 0732) BP: (83-132)/(37-75) 122/68 mmHg (09/19 0732) SpO2:  [94 %-100 %] 100 % (09/19 0732) Weight:  [165 lb 2 oz (74.9 kg)] 165 lb 2 oz (74.9 kg) (09/19 0301)  Filed Weights   08/07/2014 0402 08/21/14 0500 08/22/14 0301  Weight: 166 lb 11.2 oz (75.615 kg) 166 lb 14.2 oz (75.7 kg) 165 lb 2 oz (74.9 kg)    Weight change: -1 lb 12.2 oz (-0.8 kg)   Hemodynamic parameters for last 24 hours:    Intake/Output from previous day: 09/18 0701 - 09/19 0700 In: 710 [P.O.:600; I.V.:10; IV Piggyback:100] Out: 1075 [Urine:300; Chest Tube:775]  Intake/Output this shift:    Current Meds: Scheduled Meds: . antiseptic oral rinse  7 mL Mouth Rinse BID  . aspirin EC  81 mg Oral Daily  . atorvastatin  10 mg Oral QPM  . darbepoetin (ARANESP) injection - NON-DIALYSIS  100 mcg Subcutaneous Q Fri-1800  . feeding supplement (ENSURE COMPLETE)  237 mL Oral BID BM  . fluconazole (DIFLUCAN) IV  200 mg Intravenous Q24H  . folic acid  2 mg Oral Daily  . insulin aspart  0-15 Units Subcutaneous TID WC  . insulin aspart  0-5 Units Subcutaneous QHS  . pantoprazole  40 mg Oral Daily  . pyridOXINE  100 mg Oral QPM  . vitamin B-12  1,000 mcg Oral QPM  . vitamin E  400 Units Oral Daily   Continuous Infusions:  PRN Meds:.acetaminophen, heparin, levalbuterol, ondansetron (ZOFRAN) IV, ondansetron (ZOFRAN) IV, ondansetron, promethazine, traMADol, zolpidem  Physical  Exam: General appearance: alert, cooperative and no distress Heart: regular rate and rhythm Lungs: Slightly decreased in bases Extremities: Minimal LE edema Wound: Healing well.  L PleuRx catheter with small amount serosanguinous drainage around site.   Lab Results: CBC: Recent Labs  08/21/14 0308 08/22/14 0311  WBC 6.0 5.6  HGB 9.7* 9.3*  HCT 30.7* 29.1*  PLT 96* 74*   BMET:  Recent Labs  08/21/14 0309 08/22/14 0311  NA 131* 133*  K 4.4 4.7  CL 94* 96  CO2 25 24  GLUCOSE 80 90  BUN 36* 45*  CREATININE 2.56* 2.68*  CALCIUM 8.2* 8.2*    PT/INR:  Recent Labs  08/22/14 0311  LABPROT 14.5  INR 1.13   Radiology: Dg Chest Port 1 View  08/09/2014   CLINICAL DATA:  80 year old male with placement of bilateral Pleurex catheters.  EXAM: PORTABLE CHEST - 1 VIEW  COMPARISON:  08/31/2014  FINDINGS: Cardiomegaly, cardiac valve replacement changes, right-sided pacemaker and left IJ central venous catheter with tip overlying the mid SVC again noted.  Bilateral thoracostomy tubes are now noted with resolution of bilateral pleural effusions. There is no evidence of pneumothorax.  Significantly improved bilateral lower lung aeration noted.  Pulmonary vascular congestion again noted.  IMPRESSION: Bilateral thoracostomy tube/catheter placement with interval resolution of bilateral pleural effusions and significantly improved bilateral lower lung aeration. No evidence of pneumothorax.  Cardiomegaly and pulmonary vascular congestion.   Electronically Signed   By: Hassan Rowan M.D.   On: 08/30/2014 15:18   Dg C-arm 1-60 Min-no Report  08/22/2014   CLINICAL DATA: Bilateral Pleurex Catheter placement   C-ARM 1-60 MINUTES  Fluoroscopy was utilized by the requesting physician.  No radiographic  interpretation.    PleuRx output= R 425, L350  Assessment/Plan: S/P Procedure(s) (LRB): INSERTION PLEURAL DRAINAGE CATHETER (Bilateral) CV- Acute diastolic CHF with RV failure, s/p PPM. AHF team managing.    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 Continue Fluconazole x 1 month.  Acute/chronic renal failure-Cr up slightly this am. UOP remains marginal. Continue to monitor per renal.  May need tunneled cath next week if HD indicated. Thrombocytopenia- plts decreased further today. Monitor.  Hematology following.  S/p bilateral PleuRx catheters- Continue daily drainage for now. Deconditioning- CRPI, [place] 08/22/2014 8:41 AM  Patient seen and examined, agree with above Pleural catheters have not been drained yet today  Platelet count down from 96k to 74k- will hold off one more day on restarting heparin

## 2014-08-22 NOTE — Progress Notes (Signed)
CARDIAC REHAB PHASE I   PRE:  Rate/Rhythm: 78 paced  BP:  Supine:   Sitting: 120/63  Standing:    SaO2: 100% 2L  MODE:  Ambulation: 150 ft   POST:  Rate/Rhythm: 83 paced  BP:  Supine:   Sitting: 138/62  Standing:    SaO2: 96 2L Tolerated well with rolling walker, oxygen, and assistance x2.  Slightly stronger than yesterday.  Encouraged to walk with staff the rest of the weekend.  Discussed ambulation with nurse. [phone]  Liliane Channel RN, BSN 08/22/2014 3:40 PM   78 paced

## 2014-08-22 NOTE — Progress Notes (Signed)
Subjective:   CVVHD stopped (9/14). Underwent HD 9/16. Yesterday L and R pleurx catheters placed with R = 1600 cc out and L  1250 cc out. Weight stable and 24 hr I/O - 350 cc. Renal function trending up.   Finished ampicillic /cetriaxone for enterrococcus (9/17) and will remain on fluconazole due to Candida parapsilosis fungemia/funguira.  Echo 9/11: EF 35-40%. No obvious vegetation on TTE  Feeling better after Pleuex tubes. Denies dyspnea.  Objective:   Scheduled Meds: . antiseptic oral rinse  7 mL Mouth Rinse BID  . aspirin EC  81 mg Oral Daily  . atorvastatin  10 mg Oral QPM  . darbepoetin (ARANESP) injection - NON-DIALYSIS  100 mcg Subcutaneous Q Fri-1800  . feeding supplement (ENSURE COMPLETE)  237 mL Oral BID BM  . fluconazole (DIFLUCAN) IV  200 mg Intravenous Q24H  . folic acid  2 mg Oral Daily  . insulin aspart  0-15 Units Subcutaneous TID WC  . insulin aspart  0-5 Units Subcutaneous QHS  . pantoprazole  40 mg Oral Daily  . pyridOXINE  100 mg Oral QPM  . vitamin B-12  1,000 mcg Oral QPM  . vitamin E  400 Units Oral Daily   Continuous Infusions:   PRN Meds:.acetaminophen, heparin, levalbuterol, ondansetron (ZOFRAN) IV, ondansetron (ZOFRAN) IV, ondansetron, promethazine, traMADol, zolpidem  Filed Vitals:   08/22/14 0400 08/22/14 0500 08/22/14 0600 08/22/14 0732  BP: 115/69 127/65 132/60 122/68  Pulse:      Temp:    97.9 F (36.6 C)  TempSrc:    Oral  Resp: 21 13 27 16   Height:      Weight:      SpO2: 100% 100% 99% 100%    Intake/Output from previous day:  Intake/Output Summary (Last 24 hours) at 08/22/14 0751 Last data filed at 08/22/14 0600  Gross per 24 hour  Intake    710 ml  Output   1075 ml  Net   -365 ml    Physical Exam:  General: Comfortable, sitting up in bed Skin is warm and dry.  HEENT is normal.  Neck is supple. Mild JVD Chest clear. Pacemaker site with no hematoma Lungs: improved aeration. Clear.  Cor Regualr rate and rhythm. 2/6  systolic murmur Abdominal exam nontender or distended. No masses palpated. Extremities show . no edema. TED hose in place. neuro grossly intact  Tele : NSR 70-80s V pacing   Lab Results: Basic Metabolic Panel:  Recent Labs  08/21/14 0309 08/22/14 0311  NA 131* 133*  K 4.4 4.7  CL 94* 96  CO2 25 24  GLUCOSE 80 90  BUN 36* 45*  CREATININE 2.56* 2.68*  CALCIUM 8.2* 8.2*  PHOS 4.2 4.2   CBC:  Recent Labs  08/21/14 0308 08/22/14 0311  WBC 6.0 5.6  HGB 9.7* 9.3*  HCT 30.7* 29.1*  MCV 98.4 98.0  PLT 96* 74*     Assessment/Plan:  1. Status post aortic root replacement, bioprosthetic aortic valve replacement, bioprosthetic mitral valve replacement, coronary artery bypass graft (to LCx system) and maze. 2. Acute on chronic kidney failure-nephrology following.  3. Thrombocytopenia-improved. ITP.  Hematology following. 4. Paroxysmal atrial fibrillation- 5. CAD-continue aspirin and statin. Had SVG-LCx system with recent cardiac surgery.  6. 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 8. Acute diastolic CHF with prominent RV failure.  EF 50-55%-> 35-40%.  9. Acute respiratory failure 10. Fungemia/funguria 11. Pleural effusions: s/p thoracentesis on right.  PLAN/DISCUSSION   CVVHD stopped (9/14) and underwent HD (9/16). Remains oliguric but renal function seems to be picking up. Creatinine seems to be plateauing and weight stable. Hopefully renal function will improve.   Would hold off on ACE-I or b-blocker due to renal failure and recent low output.  Remains in NSR. Now off milrinone. Continue heparin.   Anti-infective agents per ID.   Will continue to follow. Continue heparin.   Justin Trahan,MD 7:51 AM

## 2014-08-22 NOTE — Progress Notes (Signed)
Mr. Frix is doing okay today. He's had no bleeding. There's been some oozing with his central line. She was on heparin. This is been stopped. His platelets have gone down. It is 74,000 today. I will send off a HIT assay on him to make sure it is a not have HIT His appetite is okay. He is trying to do some physical therapy.  He is off dialysis right now. His be followed closely by nephrology.  He is off antibiotics for the endocarditis now. He continues on antibiotics for the Candida blood infection.  It is possible that the thrombocytopenia could be from immune thrombo-cytopenia. If so, then I probably would consider him for endplate. I would avoid steroids because of the Candida in his blood.  He's had no obvious bleeding.  His vital signs look okay. His blood pressure is 114/68. His pulse is 79. His temperature is 98.1.  We will have to see how his platelet count trends. We may need to consider Nplate for him.  Pete E.  Isaiah 41:10

## 2014-08-23 LAB — RETICULOCYTES
RBC.: 2.91 MIL/uL — AB (ref 4.22–5.81)
Retic Count, Absolute: 154.2 10*3/uL (ref 19.0–186.0)
Retic Ct Pct: 5.3 % — ABNORMAL HIGH (ref 0.4–3.1)

## 2014-08-23 LAB — RENAL FUNCTION PANEL
Albumin: 1.9 g/dL — ABNORMAL LOW (ref 3.5–5.2)
Anion gap: 12 (ref 5–15)
BUN: 54 mg/dL — ABNORMAL HIGH (ref 6–23)
CO2: 25 meq/L (ref 19–32)
CREATININE: 2.68 mg/dL — AB (ref 0.50–1.35)
Calcium: 8.1 mg/dL — ABNORMAL LOW (ref 8.4–10.5)
Chloride: 93 mEq/L — ABNORMAL LOW (ref 96–112)
GFR, EST AFRICAN AMERICAN: 24 mL/min — AB (ref >90)
GFR, EST NON AFRICAN AMERICAN: 21 mL/min — AB (ref >90)
Glucose, Bld: 83 mg/dL (ref 70–99)
PHOSPHORUS: 4.7 mg/dL — AB (ref 2.3–4.6)
Potassium: 4.6 mEq/L (ref 3.7–5.3)
SODIUM: 130 meq/L — AB (ref 137–147)

## 2014-08-23 LAB — GLUCOSE, CAPILLARY
GLUCOSE-CAPILLARY: 160 mg/dL — AB (ref 70–99)
GLUCOSE-CAPILLARY: 105 mg/dL — AB (ref 70–99)
Glucose-Capillary: 79 mg/dL (ref 70–99)
Glucose-Capillary: 125 mg/dL — ABNORMAL HIGH (ref 70–99)

## 2014-08-23 LAB — CBC
HCT: 28.5 % — ABNORMAL LOW (ref 39.0–52.0)
Hemoglobin: 9.2 g/dL — ABNORMAL LOW (ref 13.0–17.0)
MCH: 31.6 pg (ref 26.0–34.0)
MCHC: 32.3 g/dL (ref 30.0–36.0)
MCV: 97.9 fL (ref 78.0–100.0)
PLATELETS: 64 10*3/uL — AB (ref 150–400)
RBC: 2.91 MIL/uL — AB (ref 4.22–5.81)
RDW: 21.5 % — AB (ref 11.5–15.5)
WBC: 5.7 10*3/uL (ref 4.0–10.5)

## 2014-08-23 LAB — PROTIME-INR
INR: 1.16 (ref 0.00–1.49)
Prothrombin Time: 14.8 seconds (ref 11.6–15.2)

## 2014-08-23 MED ORDER — DOCUSATE SODIUM 100 MG PO CAPS
100.0000 mg | ORAL_CAPSULE | Freq: Every day | ORAL | Status: DC | PRN
  Filled 2014-08-23 (×2): qty 1

## 2014-08-23 NOTE — Progress Notes (Signed)
Subjective:   Pleurx tubes 9/17 Last HD 9/16 with weight variable but essentially unchanged since then Creatinine may be reaching plateau Some increase in UOP yesterday to 550 cc (600 out from chest tubes) Thinks his walking went a little better yesterday in terms of breathing though stamina still quite limited  Objective Vital signs in last 24 hours: Filed Vitals:   08/23/14 0400 08/23/14 0500 08/23/14 0724 08/23/14 0800  BP: 121/72  132/59 121/63  Pulse:      Temp:   97.6 F (36.4 C)   TempSrc:   Oral   Resp: 21 16 21 20   Height:      Weight:      SpO2: 100% 100% 100% 99%  I/O last 3 completed shifts: In: 1180 [P.O.:1080; IV Piggyback:100] Out: 1150 [Urine:550; Chest Tube:600] Total I/O In: 120 [P.O.:120] Out: -   Weight Trending 08/23/14 0351 75.8 kg (167 lb 1.7 oz 08/22/14 0301 74.9 kg (165 lb 2 oz) 08/21/14 0500 75.7 kg (166 lb 14.2 oz) 08/19/14 0500 79.7 kg (175 lb 11.3 oz)  Hemodialysis 4 hours 08/18/14 0339 78.0 kg (171 lb 8.3 oz)  (79.1 on standup scales) 08/17/14 0500 75.3 kg (166 lb 0.1 oz)   CRRT discontinued 08/16/14 0500 79.3 kg (174 lb 13.2 oz) 08/15/14 0446 84.3 kg (185 lb 13.6 oz) 08/14/14 0255 89.4 kg (197 lb 1.5 oz)  08/13/14 0500 89.2 kg (196 lb 10.4 oz) 08/12/14 0413 100.2 kg (220 lb 14.4 oz) (CRRT initiated 9/8)  Physical Exam: General: Very nice thin WM NAD Finishing breakfast Left IJ temp cath with non-occlusib=ve dressing Pacer site non-inflamed, non,tender Heart:S1S2 No def s3  Lungs: Improved air movement though still with reduced BS and some crackles on the left posteriorly Abdomen: non-distended, non-tender Extremities: Trace peripheral edema RLE, none in LLE Access- Vas cath ( 9/11) in left IJ position as noted above  Basic Metabolic Panel:  Recent Labs  08/22/14 0311 08/23/14 0405  NA 133* 130*  K 4.7 4.6  CL 96 93*  CO2 24 25  GLUCOSE 90 83  BUN 45* 54*  CREATININE 2.68* 2.68*  CALCIUM 8.2* 8.1*  PHOS 4.2 4.7*      Recent Labs  08/22/14 0311 08/23/14 0405  ALBUMIN 1.9* 1.9*   CBC:  Recent Labs  08/22/14 0311 08/23/14 0415  WBC 5.6 5.7  HGB 9.3* 9.2*  HCT 29.1* 28.5*  MCV 98.0 97.9  PLT 74* 64*    Recent Labs  08/23/14 0415  RETICCTPCT 5.3*   Studies/Results: Dg Chest Port 1 View  08/17/2014   CLINICAL DATA:  Follow-up of respiratory failure ; shortness of breath  EXAM: PORTABLE CHEST - 1 VIEW  COMPARISON:  Portable chest x-ray of August 15, 2014  FINDINGS: The lungs are adequately inflated. There are small bilateral pleural effusions greater on the right than on the left. There is no alveolar infiltrate. The cardiopericardial silhouette is top-normal in size. A valve ring is in place in the mitral position. A left atrial appendage clip is present. The patient has undergone previous CABG. A left internal jugular venous catheter tip lies in the proximal SVC and is stable. The permanent pacemaker is unchanged in position. The observed portions of the bony thorax are unremarkable.  IMPRESSION: There has been mild interval improvement in the appearance of the pulmonary interstitium suggesting resolving pulmonary edema. There remain small bilateral pleural effusions greater on the right than on the left.   Electronically Signed   By: David  Martinique  9/16 There is slight interval increase in the conspicuity of the  bilateral pleural effusions. This may be at least in part due to the  patient's semi erect positioning today as compared to yesterday's  upright positioning. There mild pulmonary interstitial edema  persists especially inferiorly.  9/17 Bilateral thoracostomy tube/catheter placement with interval  resolution of bilateral pleural effusions and significantly improved  bilateral lower lung aeration. No evidence of pneumothorax.  Cardiomegaly and pulmonary vascular congestion     Scheduled Medications: . antiseptic oral rinse  7 mL Mouth Rinse BID  . aspirin EC  81 mg Oral  Daily  . atorvastatin  10 mg Oral QPM  . darbepoetin (ARANESP) injection - NON-DIALYSIS  100 mcg Subcutaneous Q Fri-1800  . feeding supplement (ENSURE COMPLETE)  237 mL Oral BID BM  . fluconazole (DIFLUCAN) IV  200 mg Intravenous Q24H  . folic acid  2 mg Oral Daily  . insulin aspart  0-15 Units Subcutaneous TID WC  . insulin aspart  0-5 Units Subcutaneous QHS  . pantoprazole  40 mg Oral Daily  . pyridOXINE  100 mg Oral QPM  . vitamin B-12  1,000 mcg Oral QPM  . vitamin E  400 Units Oral Daily   Background Pt is a 80 y.o. yo male who was admitted on 07/27/2014 with A on CRF (baseline around 1.5) in setting of enterococcal endocarditis, s/p complicated cardiac surgery (AoVR, MVR, CABG, MAZE), pressor dependent hypotension and subsequent candidemia; Renal replacement therapy 9/8-9/14. HD 9/16.  Plan AKI on CKD d/t ATN -  CRRT  9/8 - 9/14 with reduction in weight by 25 kg (was massively overloaded).  Conventional HD 9/16. Creatinine appears to have reached a plateau since his 9/16 dialysis with stable weight, potassium, acid base.  Although the jury isn't out yet as to whether he will require additional dialysis, I would favor removal of the temporary HD catheter (in since 9/11) since I don't anticipate use in the next few days and is an infection risk that can be eliminated at this time. Will then follow closely for any need to re-initiate dialysis.                                                                                                           Anemia- Aranesp 100 QWed. S/p feraheme 8/27 Also s/p transfusion- stable Cardiac -s/p Ao root replacement, AoVR, MVR, CABG and MAZE procedure/PAF - per cards and TCTS.  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 Duration of therapy.  Bilateral pleural effusions - s/p pleurex tubes bilat 9/17 with continued fairly significant output (600 cc past 24 hours)  Jamal Maes, MD [facility] [phone] Pager 08/23/2014, 8:42 AM

## 2014-08-23 NOTE — Progress Notes (Signed)
Subjective:   CVVHD stopped (9/14). Underwent HD 9/16. Yesterday L and R pleurx catheters placed with R = 1600 cc out and L  1250 cc out. Weight stable and 24 hr I/O - 350 cc. Renal function trending up.   Finished ampicillic /cetriaxone for enterrococcus (9/17) and will remain on fluconazole due to Candida parapsilosis fungemia/funguira.  Echo 9/11: EF 35-40%. No obvious vegetation on TTE  Feeling better after Pleuex tubes. Denies dyspnea. Feels better - eating breakfast when I cam in   Objective:   Scheduled Meds: . antiseptic oral rinse  7 mL Mouth Rinse BID  . aspirin EC  81 mg Oral Daily  . atorvastatin  10 mg Oral QPM  . darbepoetin (ARANESP) injection - NON-DIALYSIS  100 mcg Subcutaneous Q Fri-1800  . feeding supplement (ENSURE COMPLETE)  237 mL Oral BID BM  . fluconazole (DIFLUCAN) IV  200 mg Intravenous Q24H  . folic acid  2 mg Oral Daily  . insulin aspart  0-15 Units Subcutaneous TID WC  . insulin aspart  0-5 Units Subcutaneous QHS  . pantoprazole  40 mg Oral Daily  . pyridOXINE  100 mg Oral QPM  . vitamin B-12  1,000 mcg Oral QPM  . vitamin E  400 Units Oral Daily   Continuous Infusions:   PRN Meds:.acetaminophen, heparin, levalbuterol, ondansetron (ZOFRAN) IV, ondansetron (ZOFRAN) IV, ondansetron, promethazine, traMADol, zolpidem  Filed Vitals:   08/23/14 0400 08/23/14 0500 08/23/14 0724 08/23/14 0800  BP: 121/72  132/59 121/63  Pulse:      Temp:   97.6 F (36.4 C)   TempSrc:   Oral   Resp: 21 16 21 20   Height:      Weight:      SpO2: 100% 100% 100% 99%    Intake/Output from previous day:  Intake/Output Summary (Last 24 hours) at 08/23/14 0824 Last data filed at 08/23/14 0800  Gross per 24 hour  Intake   1300 ml  Output   1050 ml  Net    250 ml    Physical Exam:  General: Comfortable, sitting up in bed Skin is warm and dry.  HEENT is normal.  Neck is supple. Mild JVD Chest clear. Pacemaker site with no hematoma Lungs: improved aeration.  Clear.  Cor Regualr rate and rhythm. 2/6 systolic murmur Abdominal exam nontender or distended. No masses palpated. Extremities show . no edema. TED hose in place. neuro grossly intact  Tele : NSR 70-80s V pacing   Lab Results: Basic Metabolic Panel:  Recent Labs  08/22/14 0311 08/23/14 0405  NA 133* 130*  K 4.7 4.6  CL 96 93*  CO2 24 25  GLUCOSE 90 83  BUN 45* 54*  CREATININE 2.68* 2.68*  CALCIUM 8.2* 8.1*  PHOS 4.2 4.7*   CBC:  Recent Labs  08/22/14 0311 08/23/14 0415  WBC 5.6 5.7  HGB 9.3* 9.2*  HCT 29.1* 28.5*  MCV 98.0 97.9  PLT 74* 64*     Assessment/Plan:  1. Status post aortic root replacement, bioprosthetic aortic valve replacement, bioprosthetic mitral valve replacement, coronary artery bypass graft (to LCx system) and maze. 2. Acute on chronic kidney failure-nephrology following.  3. Thrombocytopenia-improved. ITP.  Hematology following. 4. Paroxysmal atrial fibrillation- 5. CAD-continue aspirin and statin. Had SVG-LCx system with recent cardiac surgery.  6. 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 8. Acute diastolic CHF with prominent RV failure.  EF 50-55%-> 35-40%.  9. Acute respiratory failure 10.  Fungemia/funguria 11. Pleural effusions: s/p thoracentesis on right.   PLAN/DISCUSSION   CVVHD stopped (9/14) and underwent HD (9/16). Remains oliguric but renal function seems to be picking up. Creatinine seems to be plateauing and weight stable. Hopefully renal function will improve.   Would hold off on ACE-I or b-blocker due to renal failure and recent low output.  Remains in NSR. Now off milrinone.   Anti-infective agents per ID.   Off heparin -  To start coumadin at some point.   Thayer Headings, Brooke Bonito., MD, [facility] 08/23/2014, 8:30 AM [address],  [place] Pager [phone]

## 2014-08-23 NOTE — Progress Notes (Addendum)
TCTS DAILY ICU PROGRESS NOTE                   [address]            ,[place] [address]          [phone]   3 Days Post-Op Procedure(s) (LRB): INSERTION PLEURAL DRAINAGE CATHETER (Bilateral)  Total Length of Stay:  LOS: 48 days   Subjective: Feels better today, breathing stable. No new complaints.   Objective: Vital signs in last 24 hours: Temp:  [97.6 F (36.4 C)-99.1 F (37.3 C)] 97.6 F (36.4 C) (09/20 0724) Cardiac Rhythm:  [-] Ventricular paced (09/20 0800) Resp:  [16-25] 20 (09/20 0800) BP: (104-141)/(59-75) 121/63 mmHg (09/20 0800) SpO2:  [96 %-100 %] 99 % (09/20 0800) Weight:  [167 lb 1.7 oz (75.8 kg)] 167 lb 1.7 oz (75.8 kg) (09/20 0351)  Filed Weights   08/21/14 0500 08/22/14 0301 08/23/14 0351  Weight: 166 lb 14.2 oz (75.7 kg) 165 lb 2 oz (74.9 kg) 167 lb 1.7 oz (75.8 kg)    Weight change: 1 lb 15.8 oz (0.9 kg)   Hemodynamic parameters for last 24 hours:    Intake/Output from previous day: 09/19 0701 - 09/20 0700 In: 1180 [P.O.:1080; IV Piggyback:100] Out: 1050 [Urine:450; Chest Tube:600]  Intake/Output this shift: Total I/O In: 120 [P.O.:120] Out: -   Current Meds: Scheduled Meds: . antiseptic oral rinse  7 mL Mouth Rinse BID  . aspirin EC  81 mg Oral Daily  . atorvastatin  10 mg Oral QPM  . darbepoetin (ARANESP) injection - NON-DIALYSIS  100 mcg Subcutaneous Q Fri-1800  . feeding supplement (ENSURE COMPLETE)  237 mL Oral BID BM  . fluconazole (DIFLUCAN) IV  200 mg Intravenous Q24H  . folic acid  2 mg Oral Daily  . insulin aspart  0-15 Units Subcutaneous TID WC  . insulin aspart  0-5 Units Subcutaneous QHS  . pantoprazole  40 mg Oral Daily  . pyridOXINE  100 mg Oral QPM  . vitamin B-12  1,000 mcg Oral QPM  . vitamin E  400 Units Oral Daily   Continuous Infusions:  PRN Meds:.acetaminophen, heparin, levalbuterol, ondansetron (ZOFRAN) IV, ondansetron (ZOFRAN) IV, ondansetron, promethazine, traMADol, zolpidem  Physical  Exam: General appearance: alert, cooperative and no distress Heart: regular rate and rhythm Lungs: Diminished BS in bases, L>R, no crackles today Extremities: No significant LE edema Wound: Clean and dry, no drainage around PleuRx site today    Lab Results: CBC: Recent Labs  08/22/14 0311 08/23/14 0415  WBC 5.6 5.7  HGB 9.3* 9.2*  HCT 29.1* 28.5*  PLT 74* 64*   BMET:  Recent Labs  08/22/14 0311 08/23/14 0405  NA 133* 130*  K 4.7 4.6  CL 96 93*  CO2 24 25  GLUCOSE 90 83  BUN 45* 54*  CREATININE 2.68* 2.68*  CALCIUM 8.2* 8.1*    PT/INR:  Recent Labs  08/23/14 0415  LABPROT 14.8  INR 1.16   Radiology: No results found.   Assessment/Plan: S/P Procedure(s) (LRB): INSERTION PLEURAL DRAINAGE CATHETER (Bilateral)  CV- Acute diastolic CHF with RV failure, s/p PPM. AHF team managing.   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 Continue Fluconazole x 1 month.   Acute/chronic renal failure-Cr has stabilized for now. UOP 450/past 24 hrs.. Continue to monitor per renal. To have temporary catheter d/c'ed today.  Thrombocytopenia- plts decreased further today, 74->64K. HIT panel ordered. Hematology following. Will continue off Heparin for now.  S/p bilateral  PleuRx catheters- output yesterday 300 apiece. Continue daily drainage for now.   Deconditioning- CRPI, PT.   COLLINS,GINA H 08/23/2014 9:10 AM  Patient seen and examined, agree with above

## 2014-08-24 ENCOUNTER — Encounter (HOSPITAL_COMMUNITY): Payer: Self-pay | Admitting: Thoracic Surgery (Cardiothoracic Vascular Surgery)

## 2014-08-24 ENCOUNTER — Inpatient Hospital Stay (HOSPITAL_COMMUNITY): Payer: Medicare Other

## 2014-08-24 DIAGNOSIS — E46 Unspecified protein-calorie malnutrition: Secondary | ICD-10-CM

## 2014-08-24 LAB — PROTIME-INR
INR: 1.17 (ref 0.00–1.49)
Prothrombin Time: 14.9 seconds (ref 11.6–15.2)

## 2014-08-24 LAB — GLUCOSE, CAPILLARY
GLUCOSE-CAPILLARY: 88 mg/dL (ref 70–99)
Glucose-Capillary: 152 mg/dL — ABNORMAL HIGH (ref 70–99)
Glucose-Capillary: 127 mg/dL — ABNORMAL HIGH (ref 70–99)

## 2014-08-24 LAB — CBC
HEMATOCRIT: 29.5 % — AB (ref 39.0–52.0)
HEMOGLOBIN: 9.3 g/dL — AB (ref 13.0–17.0)
MCH: 31.5 pg (ref 26.0–34.0)
MCHC: 31.5 g/dL (ref 30.0–36.0)
MCV: 100 fL (ref 78.0–100.0)
Platelets: 72 10*3/uL — ABNORMAL LOW (ref 150–400)
RBC: 2.95 MIL/uL — ABNORMAL LOW (ref 4.22–5.81)
RDW: 21.5 % — ABNORMAL HIGH (ref 11.5–15.5)
WBC: 6.3 10*3/uL (ref 4.0–10.5)

## 2014-08-24 LAB — RETICULOCYTES
RBC.: 2.95 MIL/uL — ABNORMAL LOW (ref 4.22–5.81)
Retic Count, Absolute: 188.8 10*3/uL — ABNORMAL HIGH (ref 19.0–186.0)
Retic Ct Pct: 6.4 % — ABNORMAL HIGH (ref 0.4–3.1)

## 2014-08-24 LAB — RENAL FUNCTION PANEL
ANION GAP: 14 (ref 5–15)
Albumin: 1.9 g/dL — ABNORMAL LOW (ref 3.5–5.2)
BUN: 63 mg/dL — AB (ref 6–23)
CO2: 24 mEq/L (ref 19–32)
Calcium: 8.3 mg/dL — ABNORMAL LOW (ref 8.4–10.5)
Chloride: 94 mEq/L — ABNORMAL LOW (ref 96–112)
Creatinine, Ser: 2.82 mg/dL — ABNORMAL HIGH (ref 0.50–1.35)
GFR calc Af Amer: 23 mL/min — ABNORMAL LOW (ref >90)
GFR calc non Af Amer: 20 mL/min — ABNORMAL LOW (ref >90)
GLUCOSE: 83 mg/dL (ref 70–99)
PHOSPHORUS: 4.3 mg/dL (ref 2.3–4.6)
POTASSIUM: 4.6 meq/L (ref 3.7–5.3)
Sodium: 132 mEq/L — ABNORMAL LOW (ref 137–147)

## 2014-08-24 MED ORDER — DEXTROSE 5 % IV SOLN
2.0000 g | Freq: Two times a day (BID) | INTRAVENOUS | Status: DC
  Administered 2014-08-24 – 2014-08-26 (×6): 2 g via INTRAVENOUS
  Filled 2014-08-24 (×7): qty 2

## 2014-08-24 MED ORDER — SODIUM CHLORIDE 0.9 % IV SOLN
2.0000 g | Freq: Three times a day (TID) | INTRAVENOUS | Status: DC
  Administered 2014-08-24 – 2014-08-27 (×9): 2 g via INTRAVENOUS
  Filled 2014-08-24 (×11): qty 2000

## 2014-08-24 NOTE — Progress Notes (Addendum)
NUTRITION FOLLOW UP  INTERVENTION:  Recommend liberalize diet to Regular until eating better  Continue Ensure Complete po BID, each supplement provides 350 kcal and 13 grams of protein  Magic Cup TID with meals.   NUTRITION DIAGNOSIS: Increased nutrient needs related to post-op healing as evidenced by estimated nutrition needs, ongoing  Goal: Pt to meet >/= 90% of their estimated nutrition needs, not meeting  Monitor:  PO & supplemental intake, weight, labs, I/O's  ASSESSMENT: 80 y.o. Male with history of CAD status post stents, HTN, dyslipidemia, atrial fibrillation no longer on anticoagulation secondary to recent GI bleed, pacemaker, ITP currently being treated with IVIG by Dr. Marin Olp (most recently 8/3) and prednisone 80mg  daily, who presented to the emergency department with complaints of fever and shortness of breath that started 8/2. In ED found to have leukocytosis, hypotension, and lactic acidosis.    Patient s/p procedures 8/21: AORTIC ROOT REPLACEMENT MITRAL VALVE REPLACEMENT CORONARY BYPASS GRAFTING MAZE PROCEDURE  Pt started CVVHD 9/8 due to fluid overload. CVVHD stopped 9/14 am due to clotting and has not resumed. Pt's last HD on 9/16. Thoracentesis 9/21.  Labs reviewed: Na low High BUN & Creatinine   Pt with bilateral pleuex tubes. Wife and patient report pt is eating about 50% of his meals. He consumes two ensures per day. Pt is agreeable to trying magic cups for additional kcal.    Height: Ht Readings from Last 1 Encounters:  07/08/14 5\' 11"  (1.803 m)    Weight: Wt Readings from Last 1 Encounters:  08/24/14 166 lb 0.1 oz (75.3 kg)  Weight has been variable due to fluid status  BMI:  Body mass index is 23.16 kg/(m^2).  Estimated Nutritional Needs: Kcal: 2000-2300 Protein: 115-130 gm Fluid: 1.2 L  Skin: chest surgical incision, +1 generalized edema  Diet Order: Regular Meal Completion: 50%   Intake/Output Summary (Last 24 hours) at 08/24/14  1140 Last data filed at 08/24/14 1000  Gross per 24 hour  Intake    580 ml  Output   1745 ml  Net  -1165 ml   BM: 9/16  Labs:   Recent Labs Lab 08/22/14 0311 08/23/14 0405 08/24/14 0342  NA 133* 130* 132*  K 4.7 4.6 4.6  CL 96 93* 94*  CO2 24 25 24   BUN 45* 54* 63*  CREATININE 2.68* 2.68* 2.82*  CALCIUM 8.2* 8.1* 8.3*  PHOS 4.2 4.7* 4.3  GLUCOSE 90 83 83    CBG (last 3)   Recent Labs  08/23/14 1609 08/23/14 2213 08/24/14 0737  GLUCAP 160* 105* 88    Scheduled Meds: . ampicillin (OMNIPEN) IV  2 g Intravenous Q8H  . antiseptic oral rinse  7 mL Mouth Rinse BID  . aspirin EC  81 mg Oral Daily  . atorvastatin  10 mg Oral QPM  . cefTRIAXone (ROCEPHIN)  IV  2 g Intravenous Q12H  . darbepoetin (ARANESP) injection - NON-DIALYSIS  100 mcg Subcutaneous Q Fri-1800  . feeding supplement (ENSURE COMPLETE)  237 mL Oral BID BM  . fluconazole (DIFLUCAN) IV  200 mg Intravenous Q24H  . folic acid  2 mg Oral Daily  . insulin aspart  0-15 Units Subcutaneous TID WC  . insulin aspart  0-5 Units Subcutaneous QHS  . pantoprazole  40 mg Oral Daily  . pyridOXINE  100 mg Oral QPM  . vitamin B-12  1,000 mcg Oral QPM  . vitamin E  400 Units Oral Daily    Continuous Infusions:    Maylon Peppers RD,  [place], [place] Pager [phone] After Hours Pager

## 2014-08-24 NOTE — Progress Notes (Signed)
Subjective:  CVVHD stopped (9/14). Underwent HD 9/16. Has bilateral  pleurex catheters with decreased output.  R =300 cc L 250 cc.   Weight down 1 pound. Renal function trending up. 2.6>2.8. Over the weekend Heparin stopped due to low platelets. Plts to 72.   Finished ampicillic /cetriaxone for enterrococcus (9/17) and will remain on fluconazole due to Candida parapsilosis fungemia/funguira.  Echo 9/11: EF 35-40%. No obvious vegetation on TTE  Bilateral Pleuex tubes. R 300 cc L 250 cc  Denies SOB  Objective:   Scheduled Meds: . antiseptic oral rinse  7 mL Mouth Rinse BID  . aspirin EC  81 mg Oral Daily  . atorvastatin  10 mg Oral QPM  . darbepoetin (ARANESP) injection - NON-DIALYSIS  100 mcg Subcutaneous Q Fri-1800  . feeding supplement (ENSURE COMPLETE)  237 mL Oral BID BM  . fluconazole (DIFLUCAN) IV  200 mg Intravenous Q24H  . folic acid  2 mg Oral Daily  . insulin aspart  0-15 Units Subcutaneous TID WC  . insulin aspart  0-5 Units Subcutaneous QHS  . pantoprazole  40 mg Oral Daily  . pyridOXINE  100 mg Oral QPM  . vitamin B-12  1,000 mcg Oral QPM  . vitamin E  400 Units Oral Daily   Continuous Infusions:   PRN Meds:.acetaminophen, docusate sodium, heparin, levalbuterol, ondansetron (ZOFRAN) IV, ondansetron (ZOFRAN) IV, ondansetron, promethazine, traMADol, zolpidem  Filed Vitals:   08/24/14 0200 08/24/14 0400 08/24/14 0500 08/24/14 0600  BP: 129/55 130/60  92/62  Pulse:  79    Temp:  98.5 F (36.9 C)    TempSrc:  Oral    Resp: 16 17  19   Height:      Weight:   166 lb 0.1 oz (75.3 kg)   SpO2: 96% 96%  88%    Intake/Output from previous day:  Intake/Output Summary (Last 24 hours) at 08/24/14 0707 Last data filed at 08/24/14 0300  Gross per 24 hour  Intake    460 ml  Output   1175 ml  Net   -715 ml    Physical Exam:  General: Comfortable, Lying in bed.  Skin is warm and dry.  HEENT is normal.  Neck is supple. Mild JVD Chest clear. Pacemaker site with  no hematoma Lungs: improved aeration. Clear.  Cor Regualr rate and rhythm. 2/6 systolic murmur Abdominal exam nontender or distended. No masses palpated. Extremities show . no edema. TED hose in place. BIlateral SCDs  neuro grossly intact  Tele : NSR 70-80s V pacing   Lab Results: Basic Metabolic Panel:  Recent Labs  08/23/14 0405 08/24/14 0342  NA 130* 132*  K 4.6 4.6  CL 93* 94*  CO2 25 24  GLUCOSE 83 83  BUN 54* 63*  CREATININE 2.68* 2.82*  CALCIUM 8.1* 8.3*  PHOS 4.7* 4.3   CBC:  Recent Labs  08/23/14 0415 08/24/14 0342  WBC 5.7 6.3  HGB 9.2* 9.3*  HCT 28.5* 29.5*  MCV 97.9 100.0  PLT 64* 72*     Assessment/Plan:  1. Status post aortic root replacement, bioprosthetic aortic valve replacement, bioprosthetic mitral valve replacement, coronary artery bypass graft (to LCx system) and maze. 2. Acute on chronic kidney failure-nephrology following.  3. Thrombocytopenia-improved. ITP.  Hematology following. 4. Paroxysmal atrial fibrillation- 5. CAD-continue aspirin and statin. Had SVG-LCx system with recent cardiac surgery.  6. 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 8. Acute diastolic CHF with prominent RV failure.  EF 50-55%-> 35-40%.  9. Acute respiratory failure 10. Fungemia/funguria 11. Pleural effusions: s/p thoracentesis on right. R and L pleurex catheters in place.  12. Immobility   PLAN/DISCUSSION   CVVHD stopped (9/14) and underwent HD (9/16). Remains oliguric but renal function seems to be picking up. Creatinine seems to be plateauing and weight stable. Hopefully renal function will improve.   Would hold off on ACE-I or b-blocker due to renal failure and recent low output.  Remains in NSR. Now off milrinone.   Anti-infective agents per ID.   Off heparin due to low platelets and bleeding.   To start coumadin at some point.   Continue to mobilize.   CLEGG,AMY NP-C  7:15  AM   Advanced Heart Failure Team Pager [phone] (M-F; 7a - 4p)  Please contact [facility] for night-coverage after hours (4p -7a ) and weekends on [email or web address]  Patient seen and examined with Darrick Grinder, NP. We discussed all aspects of the encounter. I agree with the assessment and plan as stated above. Continues to make slow but steady progress. Creatinine slightly worse again but urine output seems to be picking up. BP still too low for b-blocker. No ace or arb with renal failure.   Agree with SDU.   Niamya Vittitow,MD 9:51 AM

## 2014-08-24 NOTE — Progress Notes (Signed)
Physical Therapy Treatment Patient Details Name: Justin [name] MRN: [id number] DOB: 1932-12-16 Today's Date: 08/24/2014    History of Present Illness Pt is a 80 y.o. male with history of CAD status post stents, hypertension, dyslipidemia, a-fib, pacemaker, ITP currently being treated with IVIG, who presents to the emergency department with complaints of fever and shortness of breath. 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 Pt underwent pacer placement on 8/28. Placed on CRRT 9/8-9/13.    PT Comments    Pt progressing very well, however continues to need education on sternal precautions and how to transfer and maintain precautions. Very little support needed via RW today and patient ultimately wants to not need RW. Progression to hand-held assist appropriate on next visit.  Follow Up Recommendations  CIR     Equipment Recommendations  Rolling walker with 5" wheels    Recommendations for Other Services OT consult     Precautions / Restrictions Precautions Precautions: Sternal;Fall;ICD/Pacemaker Precaution Comments: bil pleurex catheters Restrictions Weight Bearing Restrictions: No    Mobility  Bed Mobility Overal bed mobility: Needs Assistance Bed Mobility: Supine to Sit     Supine to sit: HOB elevated;Min assist     General bed mobility comments: pt continues to not want to roll onto his side to come to sit due to pleurex catheters; HOB 20 and required less assist to pull up from supine  Transfers Overall transfer level: Needs assistance Equipment used: Rolling walker (2 wheeled) Transfers: Sit to/from Stand Sit to Stand: +2 physical assistance;Min assist;From elevated surface (progressed to one person)         General transfer comment: x 3; elevated bed and toilet; required assist to control descent into normal height chair  Ambulation/Gait Ambulation/Gait assistance: Min guard Ambulation Distance  (Feet): 150 Feet Assistive device: Rolling walker (2 wheeled) Gait Pattern/deviations: Step-through pattern;Decreased stride length;Trunk flexed Gait velocity: Decreased   General Gait Details: required standing rest x 3 due to dyspnea (SaO2 94% on RA); no assist for maneuvering RW and maintained at a proper distance   Stairs            Wheelchair Mobility    Modified Rankin (Stroke Patients Only)       Balance                                    Cognition Arousal/Alertness: Awake/alert Behavior During Therapy: WFL for tasks assessed/performed Overall Cognitive Status: Impaired/Different from baseline Area of Impairment: Memory     Memory: Decreased short-term memory;Decreased recall of precautions         General Comments: Not recalling sternal precautions    Exercises General Exercises - Lower Extremity Long Arc Quad: AROM;Both;10 reps Other Exercises Other Exercises: educated on exercise handout for general exercises (supine and sitting)    General Comments General comments (skin integrity, edema, etc.): Pt's goal is to not need RW. Feel he is ready to begin working on this.      Pertinent Vitals/Pain Pain Assessment: No/denies pain    Home Living                      Prior Function            PT Goals (current goals can now be found in the care plan section) Acute Rehab PT Goals Patient Stated Goal: To return home with wife  Frequency  Min 3X/week    PT Plan Current plan remains appropriate    Co-evaluation             End of Session Equipment Utilized During Treatment: Gait belt Activity Tolerance: Patient tolerated treatment well Patient left: in chair;with call bell/phone within reach;with family/visitor present     Time: 0383-3383 PT Time Calculation (min): 40 min  Charges:  $Gait Training: 8-22 mins $Therapeutic Exercise: 23-37 mins                    G Codes:      Thi Klich 09/18/2014, 4:30  PM Pager [phone]

## 2014-08-24 NOTE — Progress Notes (Signed)
Admit: 07/22/2014 LOS: 67  66M w/ AoCKD 2/2 ATN (BL SCr 1.5) in setting of enterococcal IE, candidemia, s/p: AVR, MVR, CABG, MAZE on CRRT 9/8-9/14/15, last HD 08/19/14  Subjective:  No new events o/n Stable UOP, GFR Eating, No foley No c/o Cont Pleurex HD Catheter removed  09/20 0701 - 09/21 0700 In: 460 [P.O.:360; IV Piggyback:100] Out: 1175 [Urine:625; Chest Tube:550]  Filed Weights   08/22/14 0301 08/23/14 0351 08/24/14 0500  Weight: 74.9 kg (165 lb 2 oz) 75.8 kg (167 lb 1.7 oz) 75.3 kg (166 lb 0.1 oz)    Scheduled Meds: . ampicillin (OMNIPEN) IV  2 g Intravenous Q8H  . antiseptic oral rinse  7 mL Mouth Rinse BID  . aspirin EC  81 mg Oral Daily  . atorvastatin  10 mg Oral QPM  . cefTRIAXone (ROCEPHIN)  IV  2 g Intravenous Q12H  . darbepoetin (ARANESP) injection - NON-DIALYSIS  100 mcg Subcutaneous Q Fri-1800  . feeding supplement (ENSURE COMPLETE)  237 mL Oral BID BM  . fluconazole (DIFLUCAN) IV  200 mg Intravenous Q24H  . folic acid  2 mg Oral Daily  . insulin aspart  0-15 Units Subcutaneous TID WC  . insulin aspart  0-5 Units Subcutaneous QHS  . pantoprazole  40 mg Oral Daily  . pyridOXINE  100 mg Oral QPM  . vitamin B-12  1,000 mcg Oral QPM  . vitamin E  400 Units Oral Daily   Continuous Infusions:  PRN Meds:.acetaminophen, docusate sodium, heparin, levalbuterol, ondansetron (ZOFRAN) IV, ondansetron (ZOFRAN) IV, ondansetron, promethazine, traMADol, zolpidem   Physical Exam:  Blood pressure 128/59, pulse 80, temperature 98.2 F (36.8 C), temperature source Oral, resp. rate 18, height 5\' 11"  (1.803 m), weight 75.3 kg (166 lb 0.1 oz), SpO2 96.00%. NAD, in bed eating breakfast Nl S1S2 L Neck bandaged S/nt/nd Minimal LEE  Assessment 1. AoCKD (BL SCr 1.5) 2/2 ATN, off HD since 08/19/14; improving UOP 2. Anemia, multifactorial, on aranesp 100 qFri 3. S/p AVR, MVR, CABG, MAZE: per TCTS and Cardiology 4. Enterococcal IE and Fungemia: [facility] following 5. Pleural  effusiosn w/ pleurex catheters  Plan 1. Cont to follow closely, no strong RRT inidications.   2. Vol status and BP seem stable, partly 2/2 pleural drainage  Pearson Grippe MD 08/24/2014, 10:46 AM   Recent Labs Lab 08/22/14 0311 08/23/14 0405 08/24/14 0342  NA 133* 130* 132*  K 4.7 4.6 4.6  CL 96 93* 94*  CO2 24 25 24   GLUCOSE 90 83 83  BUN 45* 54* 63*  CREATININE 2.68* 2.68* 2.82*  CALCIUM 8.2* 8.1* 8.3*  PHOS 4.2 4.7* 4.3    Recent Labs Lab 08/22/14 0311 08/23/14 0415 08/24/14 0342  WBC 5.6 5.7 6.3  HGB 9.3* 9.2* 9.3*  HCT 29.1* 28.5* 29.5*  MCV 98.0 97.9 100.0  PLT 74* 64* 72*

## 2014-08-24 NOTE — Progress Notes (Signed)
[address]       [place] [address]             [phone]     CARDIOTHORACIC SURGERY PROGRESS NOTE  31 Days Post-Op Procedure(s) (LRB):  ROOT REPLACEMENT WITH BIOPROSTHETIC PORCINE AORTIC ROOT REIMPLANTATION OF LEFT MAIN AND RIGHT CORONARY ARTERIES (N/A)  INTRAOPERATIVE TRANSESOPHAGEAL ECHOCARDIOGRAM (N/A)  MAZE (N/A)  CORONARY ARTERY BYPASS GRAFTING (CABG), on pump, times one, using right greater saphenous vein harvested endoscopically. (N/A)  MITRAL VALVE (MV) REPLACEMENT (N/A)   24 Days Post-Op  Procedure(s) (LRB):  PERMANENT PACEMAKER INSERTION (N/A)  4 Days Post-Op  S/P Procedure(s) (LRB): INSERTION PLEURAL DRAINAGE CATHETER (Bilateral)  Subjective: Feels okay.  Very weak.  Appetite fair.  Denies SOB.  No BM for 3-4 days  Objective: Vital signs in last 24 hours: Temp:  [98 F (36.7 C)-98.5 F (36.9 C)] 98.2 F (36.8 C) (09/21 0730) Pulse Rate:  [79-80] 80 (09/21 0730) Cardiac Rhythm:  [-] Ventricular paced (09/21 0740) Resp:  [16-23] 18 (09/21 0800) BP: (92-142)/(39-66) 128/59 mmHg (09/21 0800) SpO2:  [88 %-99 %] 96 % (09/21 0800) Weight:  [75.3 kg (166 lb 0.1 oz)] 75.3 kg (166 lb 0.1 oz) (09/21 0500)  Physical Exam:  Rhythm:   Sinus/paced  Breath sounds: clear  Heart sounds:  RRR w/out murmur  Incisions:  Healing well  Abdomen:  Soft, non-distended, non-tender  Extremities:  Warm, well-perfused, no edema   Intake/Output from previous day: 09/20 0701 - 09/21 0700 In: 460 [P.O.:360; IV Piggyback:100] Out: 1175 [Urine:625; Chest Tube:550] Intake/Output this shift: Total I/O In: -  Out: 20 [Urine:20]  Lab Results:  Recent Labs  08/23/14 0415 08/24/14 0342  WBC 5.7 6.3  HGB 9.2* 9.3*  HCT 28.5* 29.5*  PLT 64* 72*   BMET:  Recent Labs  08/23/14 0405 08/24/14 0342  NA 130* 132*  K 4.6 4.6  CL 93* 94*  CO2 25 24  GLUCOSE 83 83  BUN 54* 63*  CREATININE 2.68* 2.82*  CALCIUM 8.1* 8.3*    CBG (last 3)   Recent  Labs  08/23/14 1609 08/23/14 2213 08/24/14 0737  GLUCAP 160* 105* 88   PT/INR:   Recent Labs  08/24/14 0342  LABPROT 14.9  INR 1.17    CXR:  PORTABLE CHEST - 1 VIEW  COMPARISON: 08/12/2014.  FINDINGS:  No significant change in enlargement of the cardiac silhouette and  prominence of the pulmonary vasculature. Increased prominence of the  interstitial markings with an interval small left pleural effusion  and probable small right pleural effusion. Interval small amount of  patchy opacity in the left mid and lower lung zones and right lower  lung zone. The underlying lungs remain hyperexpanded. Stable post  CABG changes, prosthetic heart valve and right subclavian pacemaker  leads. Diffuse osteopenia.  IMPRESSION:  Mild worsening of congestive heart failure superimposed on COPD.  Electronically Signed  By: Enrique Sack M.D.  On: 08/24/2014 07:32   Assessment/Plan:  Overall Mr Staver seems to be slowly improving, although his renal function remains tenuous.  He has been doing fairly well from a CV standpoint, and his breathing has been markedly improved since Pleur-X catheters were placed.  Platelet count has again stabilized for now.  I would favor starting coumadin soon, but it's still unclear whether or not he may need a tunneled HD catheter placed.  He is currently off all anticoagulation other than ASA due to thrombocytopenia and bleeding with IV heparin.  Antibiotics for his original problem (Enterococcus bacterial endocarditis) were stopped a few days ago - this seems a bit premature to me given that he's just 4 weeks out from surgery - will ask ID team to review.  H remains on IV Diflucan for fungemia, presumably related to catheter infection.  He appears stable enough for transfer to Step-down.  Continue PT and nutritional support.  Watch renal function closely.  Trellis Vanoverbeke H 08/24/2014 9:37 AM

## 2014-08-24 NOTE — Progress Notes (Signed)
Justin [name] is about the same. He still is keeping his pleural effusions drained.  Been no bleeding. He had the dialysis catheter taken out of his neck yesterday. There is no bleeding with this.  He's had a decreased appetite. There is no nausea or vomiting. I am not sure as to how much he is able to do with physical therapy.  His platelet count today is 72. This is actually holding pretty steady. Since she is now bleeding, either we can hold off on doing any intervention right now.  He's not had any fever. He continues on Diflucan for the Candida in his blood.  He is on low-dose aspirin.  On his physical exam, his vital signs look okay. Blood pressure is a little low at 92/62. Temperature 98.5. Pulse is 80. His lungs sound pretty good. There may be some decreased sounds at the bases. Cardiac exam regular in rhythm. No murmurs are noted. Abdomen is soft. There is no palpable fluid wave. There is no palpable liver or spleen tip. Extremities shows no edema in his legs. Skin exam shows no rashes.  Again, his white count is stable for right now. I will hold off on doing any intervention. I suspect that he may have immune mediated thrombocytopenia. However, I would just watch this for right now.  Hopefully, he can get to rehabilitation or go home soon.  Justin E.  2 Corinthians 12:9-10

## 2014-08-24 NOTE — Progress Notes (Signed)
INFECTIOUS DISEASE PROGRESS NOTE  ID: Justin [name] is a 80 y.o. 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 Active Problems:   CAD (coronary artery disease)   HTN (hypertension)   Atrial fibrillation   Anemia   Thrombocytopenia   Obstructive sleep apnea   Chronotropic incompetence with sinus node dysfunction   Mitral regurgitation   SOB (shortness of breath)   CHF (congestive heart failure)   Diastolic congestive heart failure   Sepsis   Enterococcal bacteremia   TIA (transient ischemic attack)   Aortic insufficiency   Chronic kidney disease   S/P aortic valve replacement with stentless valve   S/P mitral valve replacement with bioprosthetic valve   S/P CABG x 1   S/P Maze operation for atrial fibrillation   Acute renal failure superimposed on stage 3 chronic kidney disease   Acute on chronic diastolic heart failure   Fungemia   Pleural effusions, bilateral, recurrent   Acute on chronic diastolic ACC/AHA stage C congestive heart failure  Subjective: Feels better, had thoracentesis this AM.   Abtx:  Anti-infectives   Start     Dose/Rate Route Frequency Ordered Stop   08/21/14 1800  fluconazole (DIFLUCAN) IVPB 200 mg  Status:  Discontinued     200 mg 100 mL/hr over 60 Minutes Intravenous Every 24 hours 08/31/2014 1626 08/27/2014 1628   08/31/2014 1800  fluconazole (DIFLUCAN) IVPB 200 mg     200 mg 100 mL/hr over 60 Minutes Intravenous Every 24 hours 08/09/2014 1628 09/14/14 1759   08/12/2014 1500  fluconazole (DIFLUCAN) IVPB 200 mg  Status:  Discontinued     200 mg 100 mL/hr over 60 Minutes Intravenous Every 24 hours 08/24/2014 0757 08/15/2014 1626   08/16/14 1500  fluconazole (DIFLUCAN) IVPB 200 mg  Status:  Discontinued     200 mg 100 mL/hr over 60 Minutes Intravenous Every 24 hours 08/15/14 1428 08/16/14 0857   08/16/14 1500  fluconazole (DIFLUCAN) IVPB 400 mg  Status:  Discontinued     400 mg 100 mL/hr over  120 Minutes Intravenous Every 24 hours 08/16/14 0857 08/31/2014 0757   08/15/14 1500  fluconazole (DIFLUCAN) IVPB 400 mg     400 mg 100 mL/hr over 120 Minutes Intravenous  Once 08/15/14 1428 08/15/14 1841   08/13/14 1500  micafungin (MYCAMINE) 100 mg in sodium chloride 0.9 % 100 mL IVPB  Status:  Discontinued     100 mg 100 mL/hr over 1 Hours Intravenous Every 24 hours 08/13/14 1421 08/15/14 1428   08/13/14 1430  micafungin (MYCAMINE) 100 mg in sodium chloride 0.9 % 100 mL IVPB  Status:  Discontinued     100 mg 100 mL/hr over 1 Hours Intravenous Daily 08/13/14 1421 08/13/14 1422   08/13/14 1000  fluconazole (DIFLUCAN) IVPB 100 mg  Status:  Discontinued     100 mg 50 mL/hr over 60 Minutes Intravenous Every 24 hours 08/13/14 0856 08/13/14 1421   07/16/2014 2200  ceFAZolin (ANCEF) IVPB 1 g/50 mL premix  Status:  Discontinued     1 g 100 mL/hr over 30 Minutes Intravenous 3 times per day 07/30/2014 1904 08/01/14 1252   07/16/2014 1915  gentamicin (GARAMYCIN) 80 mg in sodium chloride irrigation 0.9 % 500 mL irrigation  Status:  Discontinued     80 mg Irrigation On call 07/05/2014 1904 07/11/2014 1913   07/26/2014 1915  ceFAZolin (ANCEF) IVPB 2 g/50 mL premix  Status:  Discontinued     2  g 100 mL/hr over 30 Minutes Intravenous On call 07/05/2014 1904 07/06/2014 1913   07/29/2014 1315  gentamicin (GARAMYCIN) 80 mg in sodium chloride irrigation 0.9 % 500 mL irrigation  Status:  Discontinued      Irrigation To Cath Lab 08/03/2014 1313 08/01/14 0955   07/07/2014 1306  ceFAZolin (ANCEF) 2-3 GM-% IVPB SOLR    Comments:  Nadal, Cindy   : cabinet override      07/08/2014 1306 07/20/2014 1306   07/27/14 1500  ampicillin (OMNIPEN) 2 g in sodium chloride 0.9 % 50 mL IVPB     2 g 150 mL/hr over 20 Minutes Intravenous 3 times per day 07/27/14 1116 08/25/2014 2205   07/26/14 1200  cefTRIAXone (ROCEPHIN) 2 g in dextrose 5 % 50 mL IVPB     2 g 100 mL/hr over 30 Minutes Intravenous Every 12 hours 07/26/14 1131 08/04/2014 2215   07/25/14  0000  cefUROXime (ZINACEF) 1.5 g in dextrose 5 % 50 mL IVPB  Status:  Discontinued     1.5 g 100 mL/hr over 30 Minutes Intravenous Every 12 hours 07/28/2014 2004 07/26/14 1131   07/17/2014 2200  vancomycin (VANCOCIN) IVPB 1000 mg/200 mL premix     1,000 mg 200 mL/hr over 60 Minutes Intravenous  Once 07/08/2014 2004 07/18/2014 2346   07/22/2014 1745  vancomycin (VANCOCIN) 1,000 mg in sodium chloride 0.9 % 1,000 mL irrigation      Irrigation To Surgery 07/19/2014 1731 07/20/2014 1802   07/31/2014 0400  vancomycin (VANCOCIN) 1,250 mg in sodium chloride 0.9 % 250 mL IVPB     1,250 mg 166.7 mL/hr over 90 Minutes Intravenous To Surgery 07/23/14 1114 07/10/2014 0740   07/31/2014 0400  cefUROXime (ZINACEF) 1.5 g in dextrose 5 % 50 mL IVPB     1.5 g 100 mL/hr over 30 Minutes Intravenous To Surgery 07/23/14 1114 07/07/2014 1509   07/23/2014 0400  cefUROXime (ZINACEF) 750 mg in dextrose 5 % 50 mL IVPB  Status:  Discontinued     750 mg 100 mL/hr over 30 Minutes Intravenous To Surgery 07/23/14 1114 07/20/2014 1944   07/21/2014 0400  vancomycin (VANCOCIN) 1,000 mg in sodium chloride 0.9 % 1,000 mL irrigation      Irrigation To Surgery 07/23/14 1921 07/07/2014 1058   07/16/14 1300  [MAR Hold]  gentamicin (GARAMYCIN) IVPB 100 mg  Status:  Discontinued     (On MAR Hold since 08/01/2014 0627)   100 mg 200 mL/hr over 30 Minutes Intravenous Every 24 hours 07/15/14 1917 07/29/2014 1944   07/17/2014 1045  gentamicin (GARAMYCIN) 80 mg in sodium chloride irrigation 0.9 % 500 mL irrigation  Status:  Discontinued     80 mg Irrigation On call 07/19/2014 1031 07/13/2014 1805   07/12/14 1300  gentamicin (GARAMYCIN) IVPB 80 mg  Status:  Discontinued     80 mg 100 mL/hr over 30 Minutes Intravenous Every 24 hours 07/12/14 1200 07/15/14 1917   07/11/14 1200  ampicillin (OMNIPEN) 2 g in sodium chloride 0.9 % 50 mL IVPB  Status:  Discontinued     2 g 150 mL/hr over 20 Minutes Intravenous 4 times per day 07/11/14 0945 07/27/14 1116   07/07/14 0030   piperacillin-tazobactam (ZOSYN) IVPB 3.375 g  Status:  Discontinued     3.375 g 12.5 mL/hr over 240 Minutes Intravenous Every 8 hours 07/24/2014 2042 07/08/14 1602   07/30/2014 2300  levofloxacin (LEVAQUIN) IVPB 750 mg  Status:  Discontinued     750 mg 100 mL/hr over 90 Minutes Intravenous  Every 48 hours 07/19/2014 2232 07/08/14 1022   07/07/2014 1615  piperacillin-tazobactam (ZOSYN) IVPB 3.375 g     3.375 g 100 mL/hr over 30 Minutes Intravenous  Once 07/26/2014 1604 07/13/2014 1755   07/14/2014 1615  vancomycin (VANCOCIN) IVPB 1000 mg/200 mL premix     1,000 mg 200 mL/hr over 60 Minutes Intravenous  Once 07/23/2014 1604 07/23/14 0709   07/04/2014 0030  vancomycin (VANCOCIN) IVPB 750 mg/150 ml premix  Status:  Discontinued     750 mg 150 mL/hr over 60 Minutes Intravenous Every 12 hours 07/15/2014 2042 07/11/14 0945      Medications:  Scheduled: . antiseptic oral rinse  7 mL Mouth Rinse BID  . aspirin EC  81 mg Oral Daily  . atorvastatin  10 mg Oral QPM  . darbepoetin (ARANESP) injection - NON-DIALYSIS  100 mcg Subcutaneous Q Fri-1800  . feeding supplement (ENSURE COMPLETE)  237 mL Oral BID BM  . fluconazole (DIFLUCAN) IV  200 mg Intravenous Q24H  . folic acid  2 mg Oral Daily  . insulin aspart  0-15 Units Subcutaneous TID WC  . insulin aspart  0-5 Units Subcutaneous QHS  . pantoprazole  40 mg Oral Daily  . pyridOXINE  100 mg Oral QPM  . vitamin B-12  1,000 mcg Oral QPM  . vitamin E  400 Units Oral Daily    Objective: Vital signs in last 24 hours: Temp:  [98 F (36.7 C)-98.5 F (36.9 C)] 98.2 F (36.8 C) (09/21 0730) Pulse Rate:  [79-80] 80 (09/21 0730) Resp:  [16-23] 18 (09/21 0800) BP: (92-142)/(39-66) 128/59 mmHg (09/21 0800) SpO2:  [88 %-99 %] 96 % (09/21 0800) Weight:  [75.3 kg (166 lb 0.1 oz)] 75.3 kg (166 lb 0.1 oz) (09/21 0500)   General appearance: alert, cooperative and no distress Resp: diminished breath sounds anterior - bilateral Cardio: regular rate and rhythm and systolic  murmur: systolic ejection 2/6, crescendo at 2nd left intercostal space GI: normal findings: bowel sounds normal and soft, non-tender Extremities: edema 2-3+ RLE, greater tha LLE  Lab Results  Recent Labs  08/23/14 0405 08/23/14 0415 08/24/14 0342  WBC  --  5.7 6.3  HGB  --  9.2* 9.3*  HCT  --  28.5* 29.5*  NA 130*  --  132*  K 4.6  --  4.6  CL 93*  --  94*  CO2 25  --  24  BUN 54*  --  63*  CREATININE 2.68*  --  2.82*   Liver Panel  Recent Labs  08/23/14 0405 08/24/14 0342  ALBUMIN 1.9* 1.9*   Sedimentation Rate No results found for this basename: ESRSEDRATE,  in the last 72 hours C-Reactive Protein No results found for this basename: CRP,  in the last 72 hours  Microbiology: Recent Results (from the past 240 hour(s))  CULTURE, BLOOD (ROUTINE X 2)     Status: None   Collection Time    08/14/14 12:21 PM      Result Value Ref Range Status   Specimen Description BLOOD RIGHT FOREARM   Final   Special Requests BOTTLES DRAWN AEROBIC AND ANAEROBIC 10CC   Final   Culture  Setup Time     Final   Value: 08/14/2014 15:48     Performed at [facility]   Culture     Final   Value: NO GROWTH 5 DAYS     Performed at [facility]   Report Status 08/16/2014 FINAL   Final  CULTURE, BLOOD (  ROUTINE X 2)     Status: None   Collection Time    08/14/14  3:42 PM      Result Value Ref Range Status   Specimen Description BLOOD HEMODIALYSIS LINE   Final   Special Requests BOTTLES DRAWN AEROBIC AND ANAEROBIC 10CC   Final   Culture  Setup Time     Final   Value: 08/14/2014 18:57     Performed at [facility]   Culture     Final   Value: NO GROWTH 5 DAYS     Performed at [facility]   Report Status 08/22/2014 FINAL   Final  MRSA PCR SCREENING     Status: None   Collection Time    08/17/14 11:42 AM      Result Value Ref Range Status   MRSA by PCR NEGATIVE  NEGATIVE Final   Comment:            The GeneXpert MRSA Assay (FDA     approved for NASAL  specimens     only), is one component of a     comprehensive MRSA colonization     surveillance program. It is not     intended to diagnose MRSA     infection nor to guide or     monitor treatment for     MRSA infections.    Studies/Results: Dg Chest Port 1 View  08/24/2014   CLINICAL DATA:  Followup CHF and pleural effusions.  EXAM: PORTABLE CHEST - 1 VIEW  COMPARISON:  08/31/2014.  FINDINGS: No significant change in enlargement of the cardiac silhouette and prominence of the pulmonary vasculature. Increased prominence of the interstitial markings with an interval small left pleural effusion and probable small right pleural effusion. Interval small amount of patchy opacity in the left mid and lower lung zones and right lower lung zone. The underlying lungs remain hyperexpanded. Stable post CABG changes, prosthetic heart valve and right subclavian pacemaker leads. Diffuse osteopenia.  IMPRESSION: Mild worsening of congestive heart failure superimposed on COPD.   Electronically Signed   By: Enrique Sack M.D.   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 Would ammend this to treat him for 6 weeks from the date of his valve replacement.   Still plan for 1 month of fluconazole, this could be change to PO if he goes to rehab.  He appears to be making some progress- out of 2H today?           Bobby Rumpf Infectious Diseases (pager) [phone] [email or web address] 08/24/2014, 10:10 AM  LOS: 49 days

## 2014-08-24 NOTE — Progress Notes (Signed)
CARDIAC REHAB PHASE I   PRE:  Rate/Rhythm: 83 paced    BP: sitting 129/47    SaO2: 95 RA  MODE:  Ambulation: 222 ft   POST:  Rate/Rhythm: 92 pacing    BP: sitting 143/63     SaO2: 93 RA  Pt slightly stronger. Used RW, gait belt, assist x2. No O2 today. DOE, rests x4. Continues to be shaky/weak legged. Will continue to follow.  [id number]   Josephina Shih Edwards CES, ACSM 08/24/2014 9:54 AM

## 2014-08-25 LAB — RENAL FUNCTION PANEL
Albumin: 1.9 g/dL — ABNORMAL LOW (ref 3.5–5.2)
Anion gap: 13 (ref 5–15)
BUN: 64 mg/dL — AB (ref 6–23)
CO2: 23 meq/L (ref 19–32)
Calcium: 8.1 mg/dL — ABNORMAL LOW (ref 8.4–10.5)
Chloride: 97 mEq/L (ref 96–112)
Creatinine, Ser: 2.65 mg/dL — ABNORMAL HIGH (ref 0.50–1.35)
GFR calc Af Amer: 25 mL/min — ABNORMAL LOW (ref >90)
GFR calc non Af Amer: 21 mL/min — ABNORMAL LOW (ref >90)
GLUCOSE: 171 mg/dL — AB (ref 70–99)
POTASSIUM: 4.7 meq/L (ref 3.7–5.3)
Phosphorus: 4 mg/dL (ref 2.3–4.6)
Sodium: 133 mEq/L — ABNORMAL LOW (ref 137–147)

## 2014-08-25 LAB — RETICULOCYTES
RBC.: 3.02 MIL/uL — ABNORMAL LOW (ref 4.22–5.81)
RETIC CT PCT: 7 % — AB (ref 0.4–3.1)
Retic Count, Absolute: 211.4 10*3/uL — ABNORMAL HIGH (ref 19.0–186.0)

## 2014-08-25 LAB — CBC
HEMATOCRIT: 30.5 % — AB (ref 39.0–52.0)
HEMOGLOBIN: 9.5 g/dL — AB (ref 13.0–17.0)
MCH: 31.5 pg (ref 26.0–34.0)
MCHC: 31.1 g/dL (ref 30.0–36.0)
MCV: 101 fL — ABNORMAL HIGH (ref 78.0–100.0)
Platelets: 51 10*3/uL — ABNORMAL LOW (ref 150–400)
RBC: 3.02 MIL/uL — ABNORMAL LOW (ref 4.22–5.81)
RDW: 21.7 % — ABNORMAL HIGH (ref 11.5–15.5)
WBC: 7.1 10*3/uL (ref 4.0–10.5)

## 2014-08-25 LAB — GLUCOSE, CAPILLARY
GLUCOSE-CAPILLARY: 149 mg/dL — AB (ref 70–99)
GLUCOSE-CAPILLARY: 178 mg/dL — AB (ref 70–99)
GLUCOSE-CAPILLARY: 161 mg/dL — AB (ref 70–99)
Glucose-Capillary: 114 mg/dL — ABNORMAL HIGH (ref 70–99)
Glucose-Capillary: 127 mg/dL — ABNORMAL HIGH (ref 70–99)

## 2014-08-25 LAB — PROTIME-INR
INR: 1.14 (ref 0.00–1.49)
Prothrombin Time: 14.6 seconds (ref 11.6–15.2)

## 2014-08-25 MED ORDER — WARFARIN - PHYSICIAN DOSING INPATIENT
Freq: Every day | Status: DC
  Administered 2014-08-26 – 2014-08-30 (×4)

## 2014-08-25 MED ORDER — FUROSEMIDE 10 MG/ML IJ SOLN
80.0000 mg | Freq: Two times a day (BID) | INTRAMUSCULAR | Status: DC
  Administered 2014-08-25 – 2014-08-26 (×4): 80 mg via INTRAVENOUS
  Filled 2014-08-25 (×6): qty 8

## 2014-08-25 MED ORDER — WARFARIN SODIUM 2.5 MG PO TABS: 2.5000 mg | ORAL_TABLET | Freq: Every day | ORAL | Status: DC

## 2014-08-25 MED ORDER — WARFARIN SODIUM 1 MG PO TABS
1.0000 mg | ORAL_TABLET | Freq: Every day | ORAL | Status: DC
  Administered 2014-08-25 – 2014-08-27 (×3): 1 mg via ORAL
  Filled 2014-08-25 (×4): qty 1

## 2014-08-25 NOTE — Progress Notes (Signed)
CARDIAC REHAB PHASE I   PRE:  Rate/Rhythm: 87 paced    BP: sitting 138/71    SaO2: 96 RA  MODE:  Ambulation: 250 ft   POST:  Rate/Rhythm: 92 pacing    BP: sitting 147/68     SaO2: 97 RA  Pt continues to be weak and needed x2 assist with gait belt to stand. Pt wanted to place hands on arms of recliner to stand but we encouraged him to place hands on knees which he did not like (he couldn't push). Used RW, assist x2 with gait belt. Becoming stronger but still wobbly/shaky at times. Rest x2 due to DOE. Return to recliner, put pillows under him to heighten his chair for next time he stands. Can be x1 next walk with RW and gait belt.  [phone]   Josephina Shih [place] CES, ACSM 08/25/2014 10:19 AM

## 2014-08-25 NOTE — Progress Notes (Signed)
Subjective:  CVVHD stopped (9/14). Underwent HD 9/16 last.   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  Echo 9/11: EF 35-40%. No obvious vegetation on TTE  Urine output picking up slowly. Cr down 2.8->2.6 Pleurex catheters continue to drain. Weight up a pound. No dyspnea. Progressing well with PT. Remains in NSR. Platelet count pending.    Objective:   Scheduled Meds: . ampicillin (OMNIPEN) IV  2 g Intravenous Q8H  . antiseptic oral rinse  7 mL Mouth Rinse BID  . aspirin EC  81 mg Oral Daily  . atorvastatin  10 mg Oral QPM  . cefTRIAXone (ROCEPHIN)  IV  2 g Intravenous Q12H  . darbepoetin (ARANESP) injection - NON-DIALYSIS  100 mcg Subcutaneous Q Fri-1800  . feeding supplement (ENSURE COMPLETE)  237 mL Oral BID BM  . fluconazole (DIFLUCAN) IV  200 mg Intravenous Q24H  . folic acid  2 mg Oral Daily  . insulin aspart  0-15 Units Subcutaneous TID WC  . insulin aspart  0-5 Units Subcutaneous QHS  . pantoprazole  40 mg Oral Daily  . pyridOXINE  100 mg Oral QPM  . vitamin B-12  1,000 mcg Oral QPM  . vitamin E  400 Units Oral Daily  . warfarin  1 mg Oral q1800  . Warfarin - Physician Dosing Inpatient   Does not apply q1800   Continuous Infusions:   PRN Meds:.acetaminophen, docusate sodium, heparin, levalbuterol, ondansetron (ZOFRAN) IV, ondansetron (ZOFRAN) IV, ondansetron, promethazine, traMADol, zolpidem  Filed Vitals:   08/24/14 2200 08/25/14 0200 08/25/14 0300 08/25/14 0400  BP: 125/65 126/66 102/84 144/50  Pulse:  81 81   Temp:  98.3 F (36.8 C) 98.5 F (36.9 C)   TempSrc:  Oral Oral   Resp: 23 21 24 23   Height:      Weight:   76.6 kg (168 lb 14 oz)   SpO2:   96%     Intake/Output from previous day:  Intake/Output Summary (Last 24 hours) at 08/25/14 0748 Last data filed at 08/25/14 0416  Gross per 24 hour  Intake   1070 ml  Output   1070 ml  Net      0 ml     Physical Exam:  General: Comfortable, Lying in bed.  Skin is warm and dry.  HEENT is normal.  Neck is supple. Mild JVD Chest clear. Pacemaker site with no hematoma Lungs: improved aeration. Clear.  Cor Regualr rate and rhythm. 2/6 systolic murmur Abdominal exam nontender or distended. No masses palpated. Extremities show . no edema. TED hose in place. BIlateral SCDs  neuro grossly intact  Tele : NSR 70-80s V pacing   Lab Results: Basic Metabolic Panel:  Recent Labs  08/24/14 0342 08/25/14 0527  NA 132* 133*  K 4.6 4.7  CL 94* 97  CO2 24 23  GLUCOSE 83 171*  BUN 63* 64*  CREATININE 2.82* 2.65*  CALCIUM 8.3* 8.1*  PHOS 4.3 4.0   CBC:  Recent Labs  08/24/14 0342 08/25/14 0527  WBC 6.3 7.1  HGB 9.3* 9.5*  HCT 29.5* 30.5*  MCV 100.0 101.0*  PLT 72* PENDING     Assessment/Plan:  1. Status post aortic root replacement, bioprosthetic aortic valve replacement, bioprosthetic mitral valve replacement, coronary artery bypass graft (to LCx system) and maze. 2. Acute on chronic kidney failure-nephrology following.  3. Thrombocytopenia-improved. ITP.  Hematology following. 4. Paroxysmal atrial fibrillation- 5. CAD-continue aspirin and statin. Had SVG-LCx system  with recent cardiac surgery.  6. 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 8. Acute diastolic CHF with prominent RV failure.  EF 50-55%-> 35-40%.  9. Acute respiratory failure 10. Fungemia/funguria 11. Pleural effusions: s/p thoracentesis on right. R and L pleurex catheters in place.  12. Immobility   PLAN/DISCUSSION   CVVHD stopped (9/14) and underwent HD (9/16). Renal function seems to be picking up. I am hopeful kidneys will recover. Will ask renal about starting lasix today.  Otherwise continues to make slow but steady progress. BP still too low for b-blocker. No ace or arb with renal failure.  Coumadin being restarted today.    Justin Tokar,MD 7:48 AM

## 2014-08-25 NOTE — Progress Notes (Addendum)
[address]       [place] [address]             [phone]   32 Days Post-Op Procedure(s) (LRB):  ROOT REPLACEMENT WITH BIOPROSTHETIC PORCINE AORTIC ROOT REIMPLANTATION OF LEFT MAIN AND RIGHT CORONARY ARTERIES (N/A)  INTRAOPERATIVE TRANSESOPHAGEAL ECHOCARDIOGRAM (N/A)  MAZE (N/A)  CORONARY ARTERY BYPASS GRAFTING (CABG), on pump, times one, using right greater saphenous vein harvested endoscopically. (N/A)  MITRAL VALVE (MV) REPLACEMENT (N/A)   25 Days Post-Op  Procedure(s) (LRB):  PERMANENT PACEMAKER INSERTION (N/A)   5 Days Post-Op Procedure(s) (LRB): INSERTION PLEURAL DRAINAGE CATHETER (Bilateral)  Subjective:  Justin [name] has no specific complaints.  States he is doing better, but remains weak overall.  He is getting ready to ambulate with cardiac rehab.  Objective: Vital signs in last 24 hours: Temp:  [98.3 F (36.8 C)-98.7 F (37.1 C)] 98.6 F (37 C) (09/22 0752) Pulse Rate:  [77-82] 82 (09/22 0752) Cardiac Rhythm:  [-] Ventricular paced (09/22 0807) Resp:  [18-24] 23 (09/22 0800) BP: (102-150)/(50-84) 112/64 mmHg (09/22 0800) SpO2:  [94 %-99 %] 97 % (09/22 0752) Weight:  [168 lb 14 oz (76.6 kg)] 168 lb 14 oz (76.6 kg) (09/22 0300)  Intake/Output from previous day: 09/21 0701 - 09/22 0700 In: 1070 [P.O.:720; IV Piggyback:350] Out: 1170 [Urine:620; Chest Tube:550]  General appearance: alert, cooperative and no distress Heart: regular rate and rhythm Lungs: clear to auscultation bilaterally Abdomen: soft, non-tender; bowel sounds normal; no masses,  no organomegaly Extremities: edema trace Wound: clean and dry  Lab Results:  Recent Labs  08/24/14 0342 08/25/14 0527  WBC 6.3 7.1  HGB 9.3* 9.5*  HCT 29.5* 30.5*  PLT 72* 51*   BMET:  Recent Labs  08/24/14 0342 08/25/14 0527  NA 132* 133*  K 4.6 4.7  CL 94* 97  CO2 24 23  GLUCOSE 83 171*  BUN 63* 64*  CREATININE 2.82* 2.65*  CALCIUM 8.3* 8.1*    PT/INR:  Recent Labs  08/25/14 0527  LABPROT 14.6  INR 1.14   ABG    Component Value Date/Time   PHART 7.395 07/25/2014 2059   HCO3 19.5* 07/25/2014 2059   TCO2 20 07/25/2014 2059   ACIDBASEDEF 5.0* 07/25/2014 2059   O2SAT 66.3 08/23/2014 0435   CBG (last 3)   Recent Labs  08/24/14 1230 08/24/14 1735 08/25/14 0754  GLUCAP 152* 127* 114*    Assessment/Plan: S/P Procedure(s) (LRB): INSERTION PLEURAL DRAINAGE CATHETER (Bilateral)  1. CV- Acute Diastolic CHF, pressure remains labile- no Beta Blocker for now, Advance Heart Failure Team monitoring 2. Pulm- S/P Pleur-x catheter placement, remain in place continue to drain 550 out yesterday 3. Renal- Acute on Chronic CKD, creatinine trending down, last dialysis 9/16, Lasix restarted today, Nephrology following  4. Endocarditis- ABX originally stopped 9/17, ID asked to reassess who recommended 6 weeks of therapy from date of surgery which is 8/21, Amp/Rochephin were restarted yesterday  5. Fungemia- continue Diflucan, okay to transition to oral regimen at discharge per ID 6. CBgs- controlled, patient with no history of DM continue SSIP 7. INr 1.14, coumadin is ordered at 1 mg daily 8. Deconditioning- patient with need SNF vs CIR at discharge 9. Dispo- patient progressing, continue current care  LOS: 50 days    BARRETT, ERIN 08/25/2014  I have seen and examined the patient and agree with the assessment and plan as outlined.  Will start coumadin.  Continue to hold heparin for now.  Leeroy Lovings H 08/25/2014 8:10 am

## 2014-08-25 NOTE — Progress Notes (Signed)
Admit: 07/16/2014 LOS: 37  89M w/ AoCKD 2/2 ATN (BL SCr 1.5) in setting of enterococcal IE, candidemia, s/p: AVR, MVR, CABG, MAZE on CRRT 9/8-9/14/15, last HD 08/19/14  Subjective:  Stable UOP, unmeasured void x2 Stable GFR Feels well, has some appetite  09/21 0701 - 09/22 0700 In: 1070 [P.O.:720; IV Piggyback:350] Out: 1170 [Urine:620; Chest Tube:550]  Filed Weights   08/23/14 0351 08/24/14 0500 08/25/14 0300  Weight: 75.8 kg (167 lb 1.7 oz) 75.3 kg (166 lb 0.1 oz) 76.6 kg (168 lb 14 oz)    Scheduled Meds: . ampicillin (OMNIPEN) IV  2 g Intravenous Q8H  . antiseptic oral rinse  7 mL Mouth Rinse BID  . aspirin EC  81 mg Oral Daily  . atorvastatin  10 mg Oral QPM  . cefTRIAXone (ROCEPHIN)  IV  2 g Intravenous Q12H  . darbepoetin (ARANESP) injection - NON-DIALYSIS  100 mcg Subcutaneous Q Fri-1800  . feeding supplement (ENSURE COMPLETE)  237 mL Oral BID BM  . fluconazole (DIFLUCAN) IV  200 mg Intravenous Q24H  . folic acid  2 mg Oral Daily  . furosemide  80 mg Intravenous BID  . insulin aspart  0-15 Units Subcutaneous TID WC  . insulin aspart  0-5 Units Subcutaneous QHS  . pantoprazole  40 mg Oral Daily  . pyridOXINE  100 mg Oral QPM  . vitamin B-12  1,000 mcg Oral QPM  . vitamin E  400 Units Oral Daily  . warfarin  1 mg Oral q1800  . Warfarin - Physician Dosing Inpatient   Does not apply q1800   Continuous Infusions:  PRN Meds:.acetaminophen, docusate sodium, heparin, levalbuterol, ondansetron (ZOFRAN) IV, ondansetron (ZOFRAN) IV, ondansetron, promethazine, traMADol, zolpidem   Physical Exam:  Blood pressure 131/54, pulse 82, temperature 98.6 F (37 C), temperature source Oral, resp. rate 22, height 5\' 11"  (1.803 m), weight 76.6 kg (168 lb 14 oz), SpO2 97.00%. NAD, in bed eating breakfast Nl S1S2 L Neck bandaged S/nt/nd Minimal LEE  Assessment 1. AoCKD (BL SCr 1.5) 2/2 ATN, off HD since 08/19/14; improving UOP 2. Anemia, multifactorial, on aranesp 100 qFri 3. S/p AVR,  MVR, CABG, MAZE: per TCTS and Cardiology 4. Enterococcal IE and Fungemia: RCID following 5. Pleural effusiosn w/ pleurex catheters 6. TCP, hematology following  Plan 1. Cont to follow closely, no strong RRT inidications.   2. Start lasix 80 IV BID today, follow for response  Pearson Grippe MD 08/25/2014, 8:00 AM   Recent Labs Lab 08/23/14 0405 08/24/14 0342 08/25/14 0527  NA 130* 132* 133*  K 4.6 4.6 4.7  CL 93* 94* 97  CO2 25 24 23   GLUCOSE 83 83 171*  BUN 54* 63* 64*  CREATININE 2.68* 2.82* 2.65*  CALCIUM 8.1* 8.3* 8.1*  PHOS 4.7* 4.3 4.0    Recent Labs Lab 08/23/14 0415 08/24/14 0342 08/25/14 0527  WBC 5.7 6.3 7.1  HGB 9.2* 9.3* 9.5*  HCT 28.5* 29.5* 30.5*  MCV 97.9 100.0 101.0*  PLT 64* 72* 51*

## 2014-08-26 LAB — GLUCOSE, CAPILLARY
GLUCOSE-CAPILLARY: 182 mg/dL — AB (ref 70–99)
GLUCOSE-CAPILLARY: 104 mg/dL — AB (ref 70–99)
GLUCOSE-CAPILLARY: 114 mg/dL — AB (ref 70–99)
Glucose-Capillary: 91 mg/dL (ref 70–99)

## 2014-08-26 LAB — URINALYSIS, ROUTINE W REFLEX MICROSCOPIC
Bilirubin Urine: NEGATIVE
Glucose, UA: NEGATIVE mg/dL
HGB URINE DIPSTICK: NEGATIVE
Ketones, ur: NEGATIVE mg/dL
Leukocytes, UA: NEGATIVE
Nitrite: NEGATIVE
PROTEIN: NEGATIVE mg/dL
Specific Gravity, Urine: 1.012 (ref 1.005–1.030)
UROBILINOGEN UA: 0.2 mg/dL (ref 0.0–1.0)
pH: 5 (ref 5.0–8.0)

## 2014-08-26 LAB — CBC
HEMATOCRIT: 30.1 % — AB (ref 39.0–52.0)
HEMOGLOBIN: 9.5 g/dL — AB (ref 13.0–17.0)
MCH: 32.1 pg (ref 26.0–34.0)
MCHC: 31.6 g/dL (ref 30.0–36.0)
MCV: 101.7 fL — ABNORMAL HIGH (ref 78.0–100.0)
Platelets: 35 10*3/uL — ABNORMAL LOW (ref 150–400)
RBC: 2.96 MIL/uL — ABNORMAL LOW (ref 4.22–5.81)
RDW: 22 % — AB (ref 11.5–15.5)
WBC: 5.4 10*3/uL (ref 4.0–10.5)

## 2014-08-26 LAB — HEPARIN INDUCED THROMBOCYTOPENIA PNL
HEPARIN INDUCED PLT AB: NEGATIVE
Patient O.D.: 0.2
UFH HIGH DOSE UFH H: 0 %
UFH Low Dose 0.1 IU/mL: 0 % Release
UFH Low Dose 0.5 IU/mL: 0 % Release
UFH SRA RESULT: NEGATIVE

## 2014-08-26 LAB — RENAL FUNCTION PANEL
Albumin: 1.9 g/dL — ABNORMAL LOW (ref 3.5–5.2)
Anion gap: 12 (ref 5–15)
BUN: 64 mg/dL — ABNORMAL HIGH (ref 6–23)
CHLORIDE: 99 meq/L (ref 96–112)
CO2: 26 meq/L (ref 19–32)
Calcium: 8.3 mg/dL — ABNORMAL LOW (ref 8.4–10.5)
Creatinine, Ser: 2.55 mg/dL — ABNORMAL HIGH (ref 0.50–1.35)
GFR calc Af Amer: 26 mL/min — ABNORMAL LOW (ref >90)
GFR calc non Af Amer: 22 mL/min — ABNORMAL LOW (ref >90)
GLUCOSE: 86 mg/dL (ref 70–99)
Phosphorus: 4.2 mg/dL (ref 2.3–4.6)
Potassium: 4.3 mEq/L (ref 3.7–5.3)
SODIUM: 137 meq/L (ref 137–147)

## 2014-08-26 LAB — PROTIME-INR
INR: 1.33 (ref 0.00–1.49)
Prothrombin Time: 16.5 seconds — ABNORMAL HIGH (ref 11.6–15.2)

## 2014-08-26 LAB — RETICULOCYTES
RBC.: 2.96 MIL/uL — ABNORMAL LOW (ref 4.22–5.81)
Retic Count, Absolute: 227.9 10*3/uL — ABNORMAL HIGH (ref 19.0–186.0)
Retic Ct Pct: 7.7 % — ABNORMAL HIGH (ref 0.4–3.1)

## 2014-08-26 MED ORDER — FUROSEMIDE 10 MG/ML IJ SOLN
INTRAMUSCULAR | Status: AC
  Filled 2014-08-26: qty 2

## 2014-08-26 MED ORDER — ROMIPLOSTIM 250 MCG ~~LOC~~ SOLR
400.0000 ug | Freq: Once | SUBCUTANEOUS | Status: AC
  Administered 2014-08-26: 400 ug via SUBCUTANEOUS
  Filled 2014-08-26: qty 0.8

## 2014-08-26 NOTE — Progress Notes (Signed)
Justin [name] has been moved to a different room. He has not had dialysis for several days. He's gaining a little more weight. He has more edema in his lower extremity is.  His platelet count is going down. It was 51,000 yesterday. The day before it was 72,000. Lab work is not yet back.  We will go ahead and give him a dose of Nplate.  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 I really don't think this is from medications. I don't think this is from his fungal bacteremia.  I do not want to use steroids because of the Emidio compromised state that he is in.  I will give him a dose of Nplate. I will make the dose basis 5 mcg per kilogram.  He's had no bleeding. Most of the catheters were out of him.  He is trying to do some physical therapy. I do not know if he will be a candidate for inpatient rehabilitation.Marland Kitchen  His appetite is okay. He has had no nausea or vomiting.  There has been no bleeding. He's had no bruising.  His vital signs look pretty stable. His blood pressure is 135/74. Temperature is 98.4. His lungs sound clear. Cardiac exam regular rate and rhythm. Abdomen is soft. There is no palpable liver or spleen tip. Extremities shows 1+ edema in his lower legs.  Again, I will give him a dose of Nplate. I have to believe that we are looking at immune-based thrombocytopenia.  His anemia is pretty stable. I think this is all part of his renal insufficiency. Aranesp should help with this.  Justin [name] 3:23

## 2014-08-26 NOTE — Progress Notes (Signed)
Daily draining of pleur-X drains were done this AM at 430 and recorded in I/Os.  Will assess tomorrow.

## 2014-08-26 NOTE — Progress Notes (Signed)
Subjective:  CVVHD stopped (9/14). Underwent HD 9/16 last.   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  Echo 9/11: EF 35-40%. No obvious vegetation on TTE  Urine output picking up with lasix.  Cr down 2.8->2.6->2.5  Pleurex catheters continue to drain. No dyspnea. Progressing well with PT. Remains in NSR. Platelets back down. Getting NPlate.    Objective:   Scheduled Meds: . ampicillin (OMNIPEN) IV  2 g Intravenous Q8H  . antiseptic oral rinse  7 mL Mouth Rinse BID  . aspirin EC  81 mg Oral Daily  . atorvastatin  10 mg Oral QPM  . cefTRIAXone (ROCEPHIN)  IV  2 g Intravenous Q12H  . darbepoetin (ARANESP) injection - NON-DIALYSIS  100 mcg Subcutaneous Q Fri-1800  . feeding supplement (ENSURE COMPLETE)  237 mL Oral BID BM  . fluconazole (DIFLUCAN) IV  200 mg Intravenous Q24H  . folic acid  2 mg Oral Daily  . furosemide  80 mg Intravenous BID  . insulin aspart  0-15 Units Subcutaneous TID WC  . insulin aspart  0-5 Units Subcutaneous QHS  . pantoprazole  40 mg Oral Daily  . pyridOXINE  100 mg Oral QPM  . romiPLOStim  400 mcg Subcutaneous Once  . vitamin B-12  1,000 mcg Oral QPM  . vitamin E  400 Units Oral Daily  . warfarin  1 mg Oral q1800  . Warfarin - Physician Dosing Inpatient   Does not apply q1800   Continuous Infusions:   PRN Meds:.acetaminophen, docusate sodium, heparin, levalbuterol, ondansetron (ZOFRAN) IV, ondansetron (ZOFRAN) IV, ondansetron, promethazine, traMADol, zolpidem  Filed Vitals:   08/25/14 2300 08/26/14 0300 08/26/14 0400 08/26/14 0746  BP: 135/74   110/72  Pulse: 84 82 92 83  Temp: 98.2 F (36.8 C) 98.4 F (36.9 C)  98.6 F (37 C)  TempSrc: Oral Oral  Oral  Resp: 16 18  19   Height:  5\' 11"  (1.803 m)    Weight:  81.6 kg (179 lb 14.3 oz)    SpO2: 96% 95% 96% 95%    Intake/Output from previous day:  Intake/Output Summary (Last 24 hours) at 08/26/14  0856 Last data filed at 08/26/14 0500  Gross per 24 hour  Intake    910 ml  Output   2025 ml  Net  -1115 ml    Physical Exam:  General: Comfortable, Lying in bed.  Skin is warm and dry.  HEENT is normal.  Neck is supple. Mild JVD Chest clear. Pacemaker site with no hematoma Lungs: improved aeration. Clear.  Cor Regualr rate and rhythm. 2/6 systolic murmur Abdominal exam nontender or distended. No masses palpated. Extremities show . no edema. TED hose in place. BIlateral SCDs  neuro grossly intact  Tele : NSR 70-80s V pacing   Lab Results: Basic Metabolic Panel:  Recent Labs  08/25/14 0527 08/26/14 0650  NA 133* 137  K 4.7 4.3  CL 97 99  CO2 23 26  GLUCOSE 171* 86  BUN 64* 64*  CREATININE 2.65* 2.55*  CALCIUM 8.1* 8.3*  PHOS 4.0 4.2   CBC:  Recent Labs  08/25/14 0527 08/26/14 0650  WBC 7.1 5.4  HGB 9.5* 9.5*  HCT 30.5* 30.1*  MCV 101.0* 101.7*  PLT 51* PENDING     Assessment/Plan:  1. Status post aortic root replacement, bioprosthetic aortic valve replacement, bioprosthetic mitral valve replacement, coronary artery bypass graft (to LCx system) and maze. 2. Acute on chronic kidney failure-nephrology  following.  3. Thrombocytopenia-improved. ITP.  Hematology following. 4. Paroxysmal atrial fibrillation- 5. CAD-continue aspirin and statin. Had SVG-LCx system with recent cardiac surgery.  6. 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 8. Acute diastolic CHF with prominent RV failure.  EF 50-55%-> 35-40%.  9. Acute respiratory failure 10. Fungemia/funguria 11. Pleural effusions: s/p thoracentesis on right. R and L pleurex catheters in place.  12. Immobility   PLAN/DISCUSSION   CVVHD stopped (9/14) and underwent HD (9/16). Renal function seems to be picking up. Continue lasix.   Otherwise continues to make slow but steady progress. BP improved. May be able to start low-dose carvedilol. No  ace or arb with renal failure.  Coumadin restarted.   No new recs at this point.   Daniel Bensimhon,MD 8:56 AM

## 2014-08-26 NOTE — Progress Notes (Signed)
      [address]       [place] [address]             [phone]     CARDIOTHORACIC SURGERY PROGRESS NOTE  33 Days Post-Op Procedure(s) (LRB):  ROOT REPLACEMENT WITH BIOPROSTHETIC PORCINE AORTIC ROOT REIMPLANTATION OF LEFT MAIN AND RIGHT CORONARY ARTERIES (N/A)  INTRAOPERATIVE TRANSESOPHAGEAL ECHOCARDIOGRAM (N/A)  MAZE (N/A)  CORONARY ARTERY BYPASS GRAFTING (CABG), on pump, times one, using right greater saphenous vein harvested endoscopically. (N/A)  MITRAL VALVE (MV) REPLACEMENT (N/A)   26 Days Post-Op  Procedure(s) (LRB):  PERMANENT PACEMAKER INSERTION (N/A)   6 Days Post-Op  S/P Procedure(s) (LRB): INSERTION PLEURAL DRAINAGE CATHETER (Bilateral)  Subjective: Continues to make slow but steady improvement.  Reports feeling better.  Did a little better w/ ambulation yesterday  Objective: Vital signs in last 24 hours: Temp:  [98 F (36.7 C)-98.6 F (37 C)] 98.6 F (37 C) (09/23 0746) Pulse Rate:  [82-92] 83 (09/23 0746) Cardiac Rhythm:  [-] Ventricular paced (09/23 0746) Resp:  [16-26] 19 (09/23 0746) BP: (110-135)/(59-96) 110/72 mmHg (09/23 0746) SpO2:  [95 %-96 %] 95 % (09/23 0746) Weight:  [81.6 kg (179 lb 14.3 oz)] 81.6 kg (179 lb 14.3 oz) (09/23 0300)  Physical Exam:  Rhythm:   Sinus/paced  Breath sounds: clear  Heart sounds:  RRR  Incisions:  Clean and dry  Abdomen:  soft  Extremities:  Warm, LE edema slightly increased today   Intake/Output from previous day: 09/22 0701 - 09/23 0700 In: 1110 [P.O.:960; IV Piggyback:150] Out: 2025 [Urine:1325; Chest Tube:700] Intake/Output this shift:    Lab Results:  Recent Labs  08/25/14 0527 08/26/14 0650  WBC 7.1 5.4  HGB 9.5* 9.5*  HCT 30.5* 30.1*  PLT 51* 35*   BMET:  Recent Labs  08/25/14 0527 08/26/14 0650  NA 133* 137  K 4.7 4.3  CL 97 99  CO2 23 26  GLUCOSE 171* 86  BUN 64* 64*  CREATININE 2.65* 2.55*  CALCIUM 8.1* 8.3*    CBG (last 3)   Recent Labs  08/25/14 1737  08/25/14 2126 08/26/14 0749  GLUCAP 127* 161* 91   PT/INR:   Recent Labs  08/26/14 0650  LABPROT 16.5*  INR 1.33    CXR:  N/A  Assessment/Plan:  Overall everything seems to be slowly improving with exception of his platelet count.  In the past severe thrombocytopenia has coincided with sepsis, but he clearly had a component of thrombocytopenia to begin with consistent with ITP.  Receiving Nplate per Dr Marin Olp.  No other clinical signs of infection at this point.  Renal function seems to be improving.  Still extremely weak and debilitated.  Perhaps he might be a candidate for d/c to inpatient rehab service?  Pierre Cumpton H 08/26/2014 9:34 AM

## 2014-08-26 NOTE — Progress Notes (Signed)
CARDIAC REHAB PHASE I   PRE:  Rate/Rhythm: 81 pacing    BP: sitting 122/63    SaO2: 96 Ra  MODE:  Ambulation: 250 ft   POST:  Rate/Rhythm: 89 pacing    BP: sitting 138/65     SaO2: 93 RA  Pt tired but motivated. Needs max assist x1 from gait belt to stand. Would benefit from taller seat. Weak but able to walk with RW. Rest x2 for SOB. Return to recliner. Pt wants to be progressing faster. [place], [place], ACSM 08/26/2014 12:20 PM

## 2014-08-26 NOTE — Progress Notes (Signed)
Thank you for consult on [name]. Chart reviewed and patient examined. He is severely deconditioned from prolonged hospitalization as well as multiple medical issues. He continues to be limited by SOB as well as sternal precautions. I discussed rehab options. He lives in [place] living at [facility] and would prefer to continue rehab at [facility] care. Will defer CIR consult.

## 2014-08-26 NOTE — Progress Notes (Signed)
Admit: 08/03/2014 LOS: 68  30M w/ AoCKD 2/2 ATN (BL SCr 1.5) in setting of enterococcal IE, candidemia, s/p: AVR, MVR, CABG, MAZE on CRRT 9/8-9/14/15, last HD 08/19/14  Subjective:  Started lasix yesterday, UOP further increased Labs this AM pending AM weight seems erroneous, yesterday I<O   09/22 0701 - 09/23 0700 In: 1110 [P.O.:960; IV Piggyback:150] Out: 2025 [Urine:1325; Chest Tube:700]  Filed Weights   08/24/14 0500 08/25/14 0300 08/26/14 0300  Weight: 75.3 kg (166 lb 0.1 oz) 76.6 kg (168 lb 14 oz) 81.6 kg (179 lb 14.3 oz)    Scheduled Meds: . ampicillin (OMNIPEN) IV  2 g Intravenous Q8H  . antiseptic oral rinse  7 mL Mouth Rinse BID  . aspirin EC  81 mg Oral Daily  . atorvastatin  10 mg Oral QPM  . cefTRIAXone (ROCEPHIN)  IV  2 g Intravenous Q12H  . darbepoetin (ARANESP) injection - NON-DIALYSIS  100 mcg Subcutaneous Q Fri-1800  . feeding supplement (ENSURE COMPLETE)  237 mL Oral BID BM  . fluconazole (DIFLUCAN) IV  200 mg Intravenous Q24H  . folic acid  2 mg Oral Daily  . furosemide  80 mg Intravenous BID  . insulin aspart  0-15 Units Subcutaneous TID WC  . insulin aspart  0-5 Units Subcutaneous QHS  . pantoprazole  40 mg Oral Daily  . pyridOXINE  100 mg Oral QPM  . romiPLOStim  400 mcg Subcutaneous Once  . vitamin B-12  1,000 mcg Oral QPM  . vitamin E  400 Units Oral Daily  . warfarin  1 mg Oral q1800  . Warfarin - Physician Dosing Inpatient   Does not apply q1800   Continuous Infusions:  PRN Meds:.acetaminophen, docusate sodium, heparin, levalbuterol, ondansetron (ZOFRAN) IV, ondansetron (ZOFRAN) IV, ondansetron, promethazine, traMADol, zolpidem   Physical Exam:  Blood pressure 110/72, pulse 83, temperature 98.6 F (37 C), temperature source Oral, resp. rate 19, height 5\' 11"  (1.803 m), weight 81.6 kg (179 lb 14.3 oz), SpO2 95.00%. NAD, in bed eating breakfast Nl S1S2 CTAB Ant auscultation S/nt/nd Minimal LEE  Assessment 1. AoCKD (BL SCr 1.5) 2/2 ATN, off  HD since 08/19/14; improving UOP 2. Anemia, multifactorial, on aranesp 100 qFri 3. S/p AVR, MVR, CABG, MAZE: per TCTS and Cardiology 4. Enterococcal IE and Fungemia: RCID following 5. Pleural effusiosn w/ pleurex catheters 6. TCP, hematology following  Plan 1. Cont to follow closely, no strong RRT inidications.   2. Cont lasix at current dosing  Pearson Grippe MD 08/26/2014, 7:51 AM   Recent Labs Lab 08/23/14 0405 08/24/14 0342 08/25/14 0527  NA 130* 132* 133*  K 4.6 4.6 4.7  CL 93* 94* 97  CO2 25 24 23   GLUCOSE 83 83 171*  BUN 54* 63* 64*  CREATININE 2.68* 2.82* 2.65*  CALCIUM 8.1* 8.3* 8.1*  PHOS 4.7* 4.3 4.0    Recent Labs Lab 08/23/14 0415 08/24/14 0342 08/25/14 0527  WBC 5.7 6.3 7.1  HGB 9.2* 9.3* 9.5*  HCT 28.5* 29.5* 30.5*  MCV 97.9 100.0 101.0*  PLT 64* 72* 51*

## 2014-08-26 NOTE — Progress Notes (Signed)
Upon assessment this AM, noticed that pt's urine was fowl smelling,and pt started to complain of painful urination.  PA Gold notified.  UA and culture ordered and retrieved.  Will continue to monitor closely and update as needed.

## 2014-08-26 NOTE — Progress Notes (Signed)
Physical Therapy Treatment Patient Details Name: Justin [name] MRN: [id number] DOB: November 07, 1933 Today's Date: 08/26/2014    History of Present Illness Pt is a 80 y.o. male with history of CAD status post stents, hypertension, dyslipidemia, a-fib, pacemaker, ITP currently being treated with IVIG, who presents to the emergency department with complaints of fever and shortness of breath. 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 Pt underwent pacer placement on 8/28. Placed on CRRT 9/8-9/13.    PT Comments    Pt progressing towards physical therapy goals. Was fatigued during session due to ambulating with cardiac rehab this morning, and using the [facility] prior to session. Pt appeared frustrated with his performance during session. PT provided emotional support and encouraged him to focus on what he has accomplished. Pt continues to require frequent cues for safety, especially to maintain sternal precautions.   Follow Up Recommendations  CIR     Equipment Recommendations  Rolling walker with 5" wheels    Recommendations for Other Services OT consult     Precautions / Restrictions Precautions Precautions: Sternal;Fall;ICD/Pacemaker Precaution Comments: bil pleurex catheters Restrictions Weight Bearing Restrictions: No    Mobility  Bed Mobility Overal bed mobility: Needs Assistance Bed Mobility: Sit to Supine       Sit to supine: Mod assist   General bed mobility comments: VC's for sequencing and technique. Pt was able to somewhat adjust hips once supine, however required mod assist to elevate LE's onto bed.   Transfers Overall transfer level: Needs assistance Equipment used: Rolling walker (2 wheeled) Transfers: Sit to/from Stand Sit to Stand: Min assist;+2 physical assistance;From elevated surface         General transfer comment: Attempted sit>stand x3 unsuccessfully. Pt not confident in his ability to perform  transfer, and had difficulty shifting weight anteriorly for weight bearing through feet. With +2 assist pt was able to complete transfer without difficulty.   Ambulation/Gait Ambulation/Gait assistance: Min guard Ambulation Distance (Feet): 100 Feet Assistive device: Rolling walker (2 wheeled) Gait Pattern/deviations: Step-through pattern;Decreased stride length;Trunk flexed;Narrow base of support Gait velocity: Decreased Gait velocity interpretation: Below normal speed for age/gender General Gait Details: Standing rest break x2 utilized as pt was becoming SOB and fatigued with ambulation.    Stairs            Wheelchair Mobility    Modified Rankin (Stroke Patients Only)       Balance Overall balance assessment: Needs assistance Sitting-balance support: Feet supported;No upper extremity supported Sitting balance-Leahy Scale: Good     Standing balance support: Bilateral upper extremity supported;During functional activity Standing balance-Leahy Scale: Poor Standing balance comment: Pt requires UE assist to maintain standing balance at this time.                     Cognition Arousal/Alertness: Awake/alert Behavior During Therapy: WFL for tasks assessed/performed Overall Cognitive Status: Impaired/Different from baseline Area of Impairment: Memory     Memory: Decreased short-term memory;Decreased recall of precautions         General Comments: Not recalling sternal precautions    Exercises      General Comments General comments (skin integrity, edema, etc.): Discussed previously stated goal of walking without the walker. Pt states he is very fatigued from previous walk with cardiac rehab and from using the [facility] prior to session, and does not feel he is ready to walk without the RW just yet.       Pertinent  Vitals/Pain Pain Assessment: No/denies pain    Home Living                      Prior Function            PT Goals (current goals  can now be found in the care plan section) Acute Rehab PT Goals Patient Stated Goal: To return home with wife PT Goal Formulation: With patient Time For Goal Achievement: 08/31/14 Potential to Achieve Goals: Good Progress towards PT goals: Progressing toward goals    Frequency  Min 3X/week    PT Plan Current plan remains appropriate    Co-evaluation             End of Session Equipment Utilized During Treatment: Gait belt Activity Tolerance: Patient limited by fatigue Patient left: in chair;with call bell/phone within reach;with family/visitor present     Time: 3428-7681 PT Time Calculation (min): 30 min  Charges:  $Gait Training: 23-37 mins                    G Codes:      Jolyn Lent 2014-09-17, 5:13 PM  Jolyn Lent, PT, DPT Acute Rehabilitation Services Pager: [phone]

## 2014-08-27 ENCOUNTER — Ambulatory Visit: Payer: Medicare Other | Admitting: Family Medicine

## 2014-08-27 LAB — RENAL FUNCTION PANEL
ANION GAP: 14 (ref 5–15)
Albumin: 1.9 g/dL — ABNORMAL LOW (ref 3.5–5.2)
BUN: 70 mg/dL — AB (ref 6–23)
CO2: 27 meq/L (ref 19–32)
Calcium: 8.2 mg/dL — ABNORMAL LOW (ref 8.4–10.5)
Chloride: 97 mEq/L (ref 96–112)
Creatinine, Ser: 2.73 mg/dL — ABNORMAL HIGH (ref 0.50–1.35)
GFR calc non Af Amer: 21 mL/min — ABNORMAL LOW (ref >90)
GFR, EST AFRICAN AMERICAN: 24 mL/min — AB (ref >90)
GLUCOSE: 83 mg/dL (ref 70–99)
POTASSIUM: 4.2 meq/L (ref 3.7–5.3)
Phosphorus: 4.8 mg/dL — ABNORMAL HIGH (ref 2.3–4.6)
SODIUM: 138 meq/L (ref 137–147)

## 2014-08-27 LAB — RETICULOCYTES
RBC.: 2.91 MIL/uL — ABNORMAL LOW (ref 4.22–5.81)
Retic Count, Absolute: 209.5 10*3/uL — ABNORMAL HIGH (ref 19.0–186.0)
Retic Ct Pct: 7.2 % — ABNORMAL HIGH (ref 0.4–3.1)

## 2014-08-27 LAB — GLUCOSE, CAPILLARY
GLUCOSE-CAPILLARY: 90 mg/dL (ref 70–99)
GLUCOSE-CAPILLARY: 135 mg/dL — AB (ref 70–99)
Glucose-Capillary: 155 mg/dL — ABNORMAL HIGH (ref 70–99)
Glucose-Capillary: 73 mg/dL (ref 70–99)

## 2014-08-27 LAB — CBC
HEMATOCRIT: 29.1 % — AB (ref 39.0–52.0)
Hemoglobin: 9.1 g/dL — ABNORMAL LOW (ref 13.0–17.0)
MCH: 31.3 pg (ref 26.0–34.0)
MCHC: 31.3 g/dL (ref 30.0–36.0)
MCV: 100 fL (ref 78.0–100.0)
Platelets: 29 10*3/uL — CL (ref 150–400)
RBC: 2.91 MIL/uL — AB (ref 4.22–5.81)
RDW: 22 % — ABNORMAL HIGH (ref 11.5–15.5)
WBC: 5.2 10*3/uL (ref 4.0–10.5)

## 2014-08-27 LAB — URINE CULTURE
COLONY COUNT: NO GROWTH
Culture: NO GROWTH

## 2014-08-27 LAB — PROTIME-INR
INR: 1.24 (ref 0.00–1.49)
Prothrombin Time: 15.6 seconds — ABNORMAL HIGH (ref 11.6–15.2)

## 2014-08-27 MED ORDER — IMMUNE GLOBULIN (HUMAN) 10 GM/100ML IV SOLN
400.0000 mg/kg | INTRAVENOUS | Status: DC
  Administered 2014-08-27: 35 g via INTRAVENOUS
  Filled 2014-08-27 (×2): qty 350

## 2014-08-27 MED ORDER — FLUCONAZOLE 200 MG PO TABS
200.0000 mg | ORAL_TABLET | Freq: Every day | ORAL | Status: DC
  Administered 2014-08-27: 200 mg via ORAL
  Filled 2014-08-27 (×3): qty 1

## 2014-08-27 MED ORDER — SODIUM CHLORIDE 0.9 % IV SOLN
2.0000 g | Freq: Three times a day (TID) | INTRAVENOUS | Status: DC
  Administered 2014-08-27 – 2014-08-31 (×13): 2 g via INTRAVENOUS
  Filled 2014-08-27 (×15): qty 2000

## 2014-08-27 MED ORDER — FUROSEMIDE 80 MG PO TABS
120.0000 mg | ORAL_TABLET | Freq: Two times a day (BID) | ORAL | Status: DC
  Administered 2014-08-27 (×2): 120 mg via ORAL
  Filled 2014-08-27 (×5): qty 1

## 2014-08-27 MED ORDER — DEXTROSE 5 % IV SOLN
2.0000 g | Freq: Two times a day (BID) | INTRAVENOUS | Status: DC
  Administered 2014-08-27 – 2014-08-31 (×9): 2 g via INTRAVENOUS
  Filled 2014-08-27 (×10): qty 2

## 2014-08-27 MED ORDER — CEFTRIAXONE SODIUM 2 G IJ SOLR: 2.0000 g | Freq: Two times a day (BID) | INTRAMUSCULAR | Status: DC

## 2014-08-27 NOTE — Progress Notes (Signed)
Subjective:  CVVHD stopped (9/14). Underwent HD 9/16 last.   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  Echo 9/11: EF 35-40%. No obvious vegetation on TTE  Urine output > 1L.  Cr trending up 2.5>2.7. Weight recorded as up 10 pounds - not sure this is accurate. Pleurex catheters continue to drain ~700cc yesterday. No dyspnea. Progressing well with PT. Remains in NSR. Platelets continue to trend down.  Now 29k. Starting IVIG   Objective:   Scheduled Meds: . ampicillin (OMNIPEN) IV  2 g Intravenous Q8H  . antiseptic oral rinse  7 mL Mouth Rinse BID  . aspirin EC  81 mg Oral Daily  . atorvastatin  10 mg Oral QPM  . cefTRIAXone (ROCEPHIN)  IV  2 g Intravenous Q12H  . darbepoetin (ARANESP) injection - NON-DIALYSIS  100 mcg Subcutaneous Q Fri-1800  . feeding supplement (ENSURE COMPLETE)  237 mL Oral BID BM  . fluconazole (DIFLUCAN) IV  200 mg Intravenous Q24H  . folic acid  2 mg Oral Daily  . furosemide  80 mg Intravenous BID  . insulin aspart  0-15 Units Subcutaneous TID WC  . insulin aspart  0-5 Units Subcutaneous QHS  . pantoprazole  40 mg Oral Daily  . pyridOXINE  100 mg Oral QPM  . vitamin B-12  1,000 mcg Oral QPM  . vitamin E  400 Units Oral Daily  . warfarin  1 mg Oral q1800  . Warfarin - Physician Dosing Inpatient   Does not apply q1800   Continuous Infusions:   PRN Meds:.acetaminophen, docusate sodium, heparin, levalbuterol, ondansetron (ZOFRAN) IV, ondansetron (ZOFRAN) IV, ondansetron, promethazine, traMADol, zolpidem  Filed Vitals:   08/26/14 1953 08/26/14 2338 08/27/14 0300 08/27/14 0331  BP: 120/67 113/65  110/65  Pulse:      Temp: 98.3 F (36.8 C) 98.5 F (36.9 C)  98.5 F (36.9 C)  TempSrc: Oral Oral  Oral  Resp:    22  Height:      Weight:   179 lb 14.3 oz (81.6 kg)   SpO2: 97% 95%  96%    Intake/Output from previous day:  Intake/Output Summary (Last 24  hours) at 08/27/14 1610 Last data filed at 08/27/14 0545  Gross per 24 hour  Intake   1070 ml  Output   1575 ml  Net   -505 ml    Physical Exam:  General: Comfortable, Lying in bed.  Skin is warm and dry.  HEENT is normal.  Neck is supple. Mild JVD Chest clear.  Lungs: improved aeration. Clear.  Cor Regualr rate and rhythm. 2/6 systolic murmur Abdominal exam nontender or distended. No masses palpated. Extremities show . no edema. TED hose in place. BIlateral SCDs  neuro grossly intact  Tele : NSR 70-80s V pacing   Lab Results: Basic Metabolic Panel:  Recent Labs  08/26/14 0650 08/27/14 0357  NA 137 138  K 4.3 4.2  CL 99 97  CO2 26 27  GLUCOSE 86 83  BUN 64* 70*  CREATININE 2.55* 2.73*  CALCIUM 8.3* 8.2*  PHOS 4.2 4.8*   CBC:  Recent Labs  08/26/14 0650 08/27/14 0357  WBC 5.4 5.2  HGB 9.5* 9.1*  HCT 30.1* 29.1*  MCV 101.7* 100.0  PLT 35* 29*     Assessment/Plan:  1. Status post aortic root replacement, bioprosthetic aortic valve replacement, bioprosthetic mitral valve replacement, coronary artery bypass graft (to LCx system) and maze. 2. Acute on chronic kidney  failure-nephrology following.  3. Thrombocytopenia-improved. ITP.  Hematology following. 4. Paroxysmal atrial fibrillation- 5. CAD-continue aspirin and statin. Had SVG-LCx system with recent cardiac surgery.  6. 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 8. Acute diastolic CHF with prominent RV failure.  EF 50-55%-> 35-40%.  9. Acute respiratory failure 10. Fungemia/funguria 11. Pleural effusions: s/p thoracentesis on right. R and L pleurex catheters in place.  12. Immobility   PLAN/DISCUSSION   CVVHD stopped (9/14) and underwent HD (9/16). Urine output is good but creatinine on the rise. Renal following. No indication for HD at this point. Diuretics per Renal.    Otherwise continues to make slow but steady progress. BP  improved.Will check co-ox and if ox will add low dose carvedilol. No ace or arb with renal failure.  Coumadin restarted.   No new recs at this point.   CLEGG,AMY,NP-C  7:12 AM  Patient seen and examined with Darrick Grinder, NP. We discussed all aspects of the encounter. I agree with the assessment and plan as stated above.   Creatinine continue to creep up. Renal following. Back on IVIG for low platelets. Will check co-ox. Likely start low-dose carvedilol.   Magaline Steinberg,MD 7:54 AM

## 2014-08-27 NOTE — Progress Notes (Signed)
Notified Amy Clegg, NP of critical platelet value of 29. No orders at this time.

## 2014-08-27 NOTE — Progress Notes (Addendum)
[facility] DAILY ICU PROGRESS NOTE                   [address]            ,[place] [id number]          [phone]   7 Days Post-Op Procedure(s) (LRB): INSERTION PLEURAL DRAINAGE CATHETER (Bilateral)  Total Length of Stay:  LOS: 52 days   Subjective: Feels ok, weak but improving  Objective: Vital signs in last 24 hours: Temp:  [97.7 F (36.5 C)-98.7 F (37.1 C)] 97.8 F (36.6 C) (09/24 1120) Pulse Rate:  [78-82] 78 (09/24 1050) Cardiac Rhythm:  [-] Ventricular paced (09/24 0800) Resp:  [19-28] 23 (09/24 1050) BP: (110-138)/(53-71) 138/71 mmHg (09/24 1120) SpO2:  [95 %-98 %] 98 % (09/24 0736) Weight:  [179 lb 14.3 oz (81.6 kg)] 179 lb 14.3 oz (81.6 kg) (09/24 0300)  Filed Weights   08/25/14 0300 08/26/14 0300 08/27/14 0300  Weight: 168 lb 14 oz (76.6 kg) 179 lb 14.3 oz (81.6 kg) 179 lb 14.3 oz (81.6 kg)    Weight change: 0 lb (0 kg)   Hemodynamic parameters for last 24 hours:    Intake/Output from previous day: 09/23 0701 - 09/24 0700 In: 1070 [P.O.:720; IV Piggyback:350] Out: 9937 [Urine:1025; Chest Tube:550]  Intake/Output this shift: Total I/O In: 360 [P.O.:360] Out: 125 [Urine:125]  Current Meds: Scheduled Meds: . antiseptic oral rinse  7 mL Mouth Rinse BID  . aspirin EC  81 mg Oral Daily  . atorvastatin  10 mg Oral QPM  . darbepoetin (ARANESP) injection - NON-DIALYSIS  100 mcg Subcutaneous Q Fri-1800  . feeding supplement (ENSURE COMPLETE)  237 mL Oral BID BM  . fluconazole  200 mg Oral Daily  . folic acid  2 mg Oral Daily  . furosemide  120 mg Oral BID  . IMMUNE GLOBULIN 10% (HUMAN) IV - For Fluid Restriction Only  400 mg/kg Intravenous Q24H  . insulin aspart  0-15 Units Subcutaneous TID WC  . insulin aspart  0-5 Units Subcutaneous QHS  . pantoprazole  40 mg Oral Daily  . pyridOXINE  100 mg Oral QPM  . vitamin B-12  1,000 mcg Oral QPM  . vitamin E  400 Units Oral Daily  . warfarin  1 mg Oral q1800  . Warfarin - Physician Dosing  Inpatient   Does not apply q1800   Continuous Infusions:  PRN Meds:.acetaminophen, docusate sodium, heparin, levalbuterol, ondansetron (ZOFRAN) IV, ondansetron (ZOFRAN) IV, ondansetron, promethazine, traMADol, zolpidem  General appearance: alert, cooperative, fatigued and no distress Heart: regular rate and rhythm Lungs: clear to auscultation bilaterally Abdomen: soft, nontender Extremities: no edema Wound: incis healing well  Lab Results: CBC: Recent Labs  08/26/14 0650 08/27/14 0357  WBC 5.4 5.2  HGB 9.5* 9.1*  HCT 30.1* 29.1*  PLT 35* 29*   BMET:  Recent Labs  08/26/14 0650 08/27/14 0357  NA 137 138  K 4.3 4.2  CL 99 97  CO2 26 27  GLUCOSE 86 83  BUN 64* 70*  CREATININE 2.55* 2.73*  CALCIUM 8.3* 8.2*    PT/INR:  Recent Labs  08/27/14 0357  LABPROT 15.6*  INR 1.24   Radiology: No results found.   Assessment/Plan: S/P Procedure(s) (LRB): INSERTION PLEURAL DRAINAGE CATHETER (Bilateral)  1 restart amp/rocephin- ID rec 6 weeks from dau of surgery. Stopped by Dr Marin Olp today. 2 may need to increase coumadin dose 3 ITP management per Dr Marin Olp 4 nephrology managing renal insuffic 5  hoping to progress to rehab soon GOLD,WAYNE E 08/27/2014 12:22 PM   I have seen and examined the patient and agree with the assessment and plan as outlined.  However, Mr Desaulniers has developed somewhat sudden onset dysarthria and weakness worrisome for the possibility that he might be having a stroke.  He is currently receiving ASA and coumadin, although he is not therapeutic on coumadin.  Will ask Stroke Team to evaluate.  I am not certain whether or not his pacemaker is MRI compatible.  He is far enough out from surgery that he otherwise could have an MRI.  Will increase dose of coumadin.  I remain reluctant to add lovenox or heparin due to profound thrombocytopenia.  Mr Kerkhoff needs to remain on ampicillin and Rocephin for his endocarditis - this was discussed w/ Dr Johnnye Sima from ID  team.  It is not clear why antibiotics were stopped earlier today by Dr Marin Olp.  We may need to consider TEE if he has suffered an embolic stroke.  [name],[name] 08/28/2014 8:57 AM

## 2014-08-27 NOTE — Progress Notes (Signed)
[name] is about the same. He has not had dialysis for several days. Looks like his renal function is stabilizing.  Am unsure as to why he still is on the ampicillin and Rocephin. He was supposed to stop these back on the 17th. We'll go ahead and stop these.  He continues on Diflucan for the Candida in his blood. I think impelling onto oral Diflucan.  His platelet count is going down. Again, I have to believe that this is immune-based thrombocytopenia. His heparin induced thrombocytopenia panel was negative.  He did get a dose of Nplate yesterday. I will get him back on IVIG. I want to try to avoid using steroids given his already immunocompromised state.  He might be going to inpatient rehabilitation or he could go back to where he lives. I think there might be a rehabilitation center where he and his wife live.  His appetite is okay. He's had no nausea or vomiting.  He's had no bleeding.   His vital signs look pretty good. Blood pressure is 150/53. Temperature 97.7. Heart rate 79. His lungs are clear bilaterally. Cardiac exam regular in rhythm. Abdomen is soft. There is no palpable liver or spleen tip. Extremities shows trace edema in his lower legs. Skin exam shows some scattered ecchymoses.  Again, I have to believe that the thrombocytopenia is reflective of immune-based issues. I think he probably does have ITP. I don't think we have to do a bone marrow test on him.  We will see what the IVIG can do.  Hopefully, he can come down off of some of his medications.  Justin [name] 139:13

## 2014-08-27 NOTE — Progress Notes (Signed)
CARDIAC REHAB PHASE I   PRE:  Rate/Rhythm: 78 pacing    BP: sitting 113/59    SaO2: 98 RA  MODE:  Ambulation: 350 ft   POST:  Rate/Rhythm: 95 pacing    BP: sitting 138/71     SaO2: 100 RA   Max assist with gait belt to stand. Once up pt able to walk with RW, fairly steady. Rest x4 with increased distance today. Discussed with pt importance of rest as well as activity/ex therefore to bed after walk for a nap before lunch. Pt is progressing, just slowly.  [id number] Josephina Shih [place] CES, ACSM 08/27/2014 11:36 AM

## 2014-08-27 NOTE — Progress Notes (Signed)
Admit: 07/05/2014 LOS: 12  12M w/ AoCKD 2/2 ATN (BL SCr 1.5) in setting of enterococcal IE, candidemia, s/p: AVR, MVR, CABG, MAZE on CRRT 9/8-9/14/15, last HD 08/19/14  Subjective:  Clinically stable SCr inching up On BID lasix IV To rec IVIG Walked x2 yesterday In chair, smiling  09/23 0701 - 09/24 0700 In: 1070 [P.O.:720; IV Piggyback:350] Out: 1575 [Urine:1025; Chest Tube:550]  Filed Weights   08/25/14 0300 08/26/14 0300 08/27/14 0300  Weight: 76.6 kg (168 lb 14 oz) 81.6 kg (179 lb 14.3 oz) 81.6 kg (179 lb 14.3 oz)    Scheduled Meds: . antiseptic oral rinse  7 mL Mouth Rinse BID  . aspirin EC  81 mg Oral Daily  . atorvastatin  10 mg Oral QPM  . darbepoetin (ARANESP) injection - NON-DIALYSIS  100 mcg Subcutaneous Q Fri-1800  . feeding supplement (ENSURE COMPLETE)  237 mL Oral BID BM  . fluconazole  200 mg Oral Daily  . folic acid  2 mg Oral Daily  . furosemide  80 mg Intravenous BID  . IMMUNE GLOBULIN 10% (HUMAN) IV - For Fluid Restriction Only  400 mg/kg Intravenous Q24H  . insulin aspart  0-15 Units Subcutaneous TID WC  . insulin aspart  0-5 Units Subcutaneous QHS  . pantoprazole  40 mg Oral Daily  . pyridOXINE  100 mg Oral QPM  . vitamin B-12  1,000 mcg Oral QPM  . vitamin E  400 Units Oral Daily  . warfarin  1 mg Oral q1800  . Warfarin - Physician Dosing Inpatient   Does not apply q1800   Continuous Infusions:  PRN Meds:.acetaminophen, docusate sodium, heparin, levalbuterol, ondansetron (ZOFRAN) IV, ondansetron (ZOFRAN) IV, ondansetron, promethazine, traMADol, zolpidem   Physical Exam:  Blood pressure 115/53, pulse 79, temperature 97.7 F (36.5 C), temperature source Oral, resp. rate 20, height 5\' 11"  (1.803 m), weight 81.6 kg (179 lb 14.3 oz), SpO2 98.00%. NAD, looks more energetic Nl S1S2 CTAB Ant auscultation S/nt/nd Minimal LEE  Assessment 1. AoCKD (BL SCr 1.5) 2/2 ATN, off HD since 08/19/14; improving UOP; some slight inc in SCr but no need to change plans  currently.   Anemia, multifactorial, on aranesp 100 qFri 2. S/p AVR, MVR, CABG, MAZE: per [facility] and Cardiology 3. Enterococcal IE and Fungemia: [facility] following 4. Pleural effusiosn w/ pleurex catheters 5. TCP, hematology following, for IVIG 08/27/14  Plan 1. Cont to follow closely, no strong RRT inidications.   2. Will switch to PO lasix today 80 IV BID --> 120 PO BID  Pearson Grippe MD 08/27/2014, 8:01 AM   Recent Labs Lab 08/25/14 0527 08/26/14 0650 08/27/14 0357  NA 133* 137 138  K 4.7 4.3 4.2  CL 97 99 97  CO2 23 26 27   GLUCOSE 171* 86 83  BUN 64* 64* 70*  CREATININE 2.65* 2.55* 2.73*  CALCIUM 8.1* 8.3* 8.2*  PHOS 4.0 4.2 4.8*    Recent Labs Lab 08/25/14 0527 08/26/14 0650 08/27/14 0357  WBC 7.1 5.4 5.2  HGB 9.5* 9.5* 9.1*  HCT 30.5* 30.1* 29.1*  MCV 101.0* 101.7* 100.0  PLT 51* 35* 29*

## 2014-08-27 NOTE — Evaluation (Signed)
Occupational Therapy Evaluation Patient Details Name: Justin [name] MRN: [id number] DOB: 09-25-1933 Today's Date: 08/27/2014    History of Present Illness Pt is a 80 y.o. male with history of CAD status post stents, hypertension, dyslipidemia, a-fib, pacemaker, ITP currently being treated with IVIG, who presents to the emergency department with complaints of fever and shortness of breath. 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 Pt underwent pacer placement on 8/28. Placed on CRRT 9/8-9/13.   Clinical Impression   Pt was a fully functional, active man prior to admission.  He now presents with generalized weakness, impaired balance and impaired memory interfering with ability to perform ADL and ADL transfers. Will follow acutely.  Pt will need a period of rehab prior to return home with his wife.  Prefers SNF as he lives at [facility], but pleurex drains may preclude this.   Follow Up Recommendations  CIR;Supervision/Assistance - 24 hour (pt may be precluded from SNF because of drains)    Equipment Recommendations       Recommendations for Other Services       Precautions / Restrictions Precautions Precautions: Sternal;Fall;ICD/Pacemaker Precaution Comments: bil pleurex catheters Restrictions Weight Bearing Restrictions: No      Mobility Bed Mobility Overal bed mobility: Needs Assistance Bed Mobility: Sit to Supine;Supine to Sit     Supine to sit: HOB elevated;Min assist Sit to supine: Mod assist   General bed mobility comments: VC's for sequencing and technique. Pt was able to somewhat adjust hips once supine, however required mod assist to elevate LE's onto bed.   Transfers Overall transfer level: Needs assistance Equipment used: Rolling walker (2 wheeled) Transfers: Stand Pivot Transfers Sit to Stand: Mod assist;From elevated surface Stand pivot transfers: Min guard       General transfer comment:  difficulty rising from low surface due to inability to use UEs to push to stand    Balance     Sitting balance-Leahy Scale: Good       Standing balance-Leahy Scale: Poor                              ADL Overall ADL's : Needs assistance/impaired Eating/Feeding: Independent;Sitting   Grooming: Wash/dry hands;Wash/dry face;Set up;Sitting   Upper Body Bathing: Minimal assitance;Sitting   Lower Body Bathing: Maximal assistance;Sit to/from stand   Upper Body Dressing : Minimal assistance;Sitting   Lower Body Dressing: Maximal assistance;Sit to/from stand   Toilet Transfer: Stand-pivot;BSC;RW;Min guard   Toileting- Water quality scientist and Hygiene: Maximal assistance;Sit to/from stand         General ADL Comments: Pt unable to release walker and balance in standing.     Vision                     Perception     Praxis      Pertinent Vitals/Pain Pain Assessment: No/denies pain     Hand Dominance Right   Extremity/Trunk Assessment Upper Extremity Assessment Upper Extremity Assessment: Overall WFL for tasks assessed (not formally assessed due to sternal precautions)   Lower Extremity Assessment Lower Extremity Assessment: Defer to PT evaluation   Cervical / Trunk Assessment Cervical / Trunk Assessment: Normal   Communication Communication Communication: No difficulties   Cognition Arousal/Alertness: Awake/alert Behavior During Therapy: WFL for tasks assessed/performed Overall Cognitive Status: Impaired/Different from baseline Area of Impairment: Memory     Memory: Decreased short-term memory;Decreased recall of  precautions         General Comments: losing his "train of thought" with conversation, not able to state sternal precautions   General Comments       Exercises       Shoulder Instructions      Home Living Family/patient expects to be discharged to:: Private residence (independent living at [facility]) Living  Arrangements: Spouse/significant other Available Help at Discharge: Family;Available 24 hours/day Type of Home: House Home Access: Level entry     Home Layout: One level     Bathroom Shower/Tub: Occupational psychologist: Standard     Home Equipment: Environmental consultant - 2 wheels;Cane - single point;Shower seat - built in;Grab bars - toilet;Grab bars - tub/shower;Toilet riser          Prior Functioning/Environment Level of Independence: Independent        Comments: Still driving, does most of the grocery shopping, minimal cooking - meals provided,.    OT Diagnosis: Generalized weakness;Cognitive deficits   OT Problem List: Decreased strength;Decreased activity tolerance;Impaired balance (sitting and/or standing);Decreased safety awareness;Decreased knowledge of use of DME or AE;Impaired UE functional use;Cardiopulmonary status limiting activity;Decreased cognition   OT Treatment/Interventions: Self-care/ADL training;Energy conservation;DME and/or AE instruction;Therapeutic activities;Cognitive remediation/compensation;Patient/family education;Balance training    OT Goals(Current goals can be found in the care plan section) Acute Rehab OT Goals Patient Stated Goal: return to PLOF OT Goal Formulation: With patient Time For Goal Achievement: 09/03/14 Potential to Achieve Goals: Good ADL Goals Pt Will Perform Grooming: with min guard assist;standing Pt Will Perform Lower Body Dressing: with min assist;sit to/from stand Pt Will Transfer to Toilet: with min guard assist;ambulating;bedside commode (over toilet) Pt Will Perform Toileting - Clothing Manipulation and hygiene: with min guard assist;sit to/from stand Additional ADL Goal #1: Pt will adhere to sternal precautions during ADL and mobility independently. Additional ADL Goal #2: Pt will employ energy conservation strategies in ADL and mobility with minimal verbal cues. Additional ADL Goal #3: Pt will perform bed mobility with  supervsion.  OT Frequency: Min 2X/week   Barriers to D/C:            Co-evaluation              End of Session    Activity Tolerance: Patient limited by fatigue Patient left: in bed;with call bell/phone within reach;with family/visitor present   Time: 1352-1420 OT Time Calculation (min): 28 min Charges:  OT General Charges $OT Visit: 1 Procedure OT Evaluation $Initial OT Evaluation Tier I: 1 Procedure OT Treatments $Self Care/Home Management : 8-22 mins G-Codes:    Malka So 08/27/2014, 3:01 PM [phone]

## 2014-08-28 ENCOUNTER — Encounter (HOSPITAL_COMMUNITY): Payer: Self-pay | Admitting: *Deleted

## 2014-08-28 ENCOUNTER — Inpatient Hospital Stay (HOSPITAL_COMMUNITY): Payer: Medicare Other

## 2014-08-28 DIAGNOSIS — I6350 Cerebral infarction due to unspecified occlusion or stenosis of unspecified cerebral artery: Secondary | ICD-10-CM

## 2014-08-28 LAB — CBC
HCT: 28.4 % — ABNORMAL LOW (ref 39.0–52.0)
HEMOGLOBIN: 9.1 g/dL — AB (ref 13.0–17.0)
MCH: 31.9 pg (ref 26.0–34.0)
MCHC: 32 g/dL (ref 30.0–36.0)
MCV: 99.6 fL (ref 78.0–100.0)
Platelets: 25 10*3/uL — CL (ref 150–400)
RBC: 2.85 MIL/uL — AB (ref 4.22–5.81)
RDW: 22 % — ABNORMAL HIGH (ref 11.5–15.5)
WBC: 4.4 10*3/uL (ref 4.0–10.5)

## 2014-08-28 LAB — RENAL FUNCTION PANEL
ANION GAP: 17 — AB (ref 5–15)
Albumin: 1.8 g/dL — ABNORMAL LOW (ref 3.5–5.2)
BUN: 68 mg/dL — ABNORMAL HIGH (ref 6–23)
CHLORIDE: 98 meq/L (ref 96–112)
CO2: 25 meq/L (ref 19–32)
Calcium: 8 mg/dL — ABNORMAL LOW (ref 8.4–10.5)
Creatinine, Ser: 2.69 mg/dL — ABNORMAL HIGH (ref 0.50–1.35)
GFR calc non Af Amer: 21 mL/min — ABNORMAL LOW (ref >90)
GFR, EST AFRICAN AMERICAN: 24 mL/min — AB (ref >90)
GLUCOSE: 84 mg/dL (ref 70–99)
POTASSIUM: 3.8 meq/L (ref 3.7–5.3)
Phosphorus: 4.7 mg/dL — ABNORMAL HIGH (ref 2.3–4.6)
SODIUM: 140 meq/L (ref 137–147)

## 2014-08-28 LAB — RETICULOCYTES
RBC.: 2.85 MIL/uL — ABNORMAL LOW (ref 4.22–5.81)
RETIC COUNT ABSOLUTE: 208.1 10*3/uL — AB (ref 19.0–186.0)
Retic Ct Pct: 7.3 % — ABNORMAL HIGH (ref 0.4–3.1)

## 2014-08-28 LAB — GLUCOSE, CAPILLARY
Glucose-Capillary: 97 mg/dL (ref 70–99)
Glucose-Capillary: 167 mg/dL — ABNORMAL HIGH (ref 70–99)
Glucose-Capillary: 163 mg/dL — ABNORMAL HIGH (ref 70–99)
Glucose-Capillary: 111 mg/dL — ABNORMAL HIGH (ref 70–99)

## 2014-08-28 LAB — PROTIME-INR
INR: 1.27 (ref 0.00–1.49)
Prothrombin Time: 15.9 seconds — ABNORMAL HIGH (ref 11.6–15.2)

## 2014-08-28 MED ORDER — WARFARIN SODIUM 2 MG PO TABS
2.0000 mg | ORAL_TABLET | Freq: Every day | ORAL | Status: DC
  Administered 2014-08-28 – 2014-08-30 (×3): 2 mg via ORAL
  Filled 2014-08-28 (×4): qty 1

## 2014-08-28 MED ORDER — FOLIC ACID 5 MG/ML IJ SOLN
2.0000 mg | Freq: Every day | INTRAMUSCULAR | Status: DC
  Administered 2014-08-28 – 2014-08-31 (×4): 2 mg via INTRAVENOUS
  Filled 2014-08-28 (×6): qty 0.4

## 2014-08-28 MED ORDER — IMMUNE GLOBULIN (HUMAN) 10 GM/100ML IV SOLN
400.0000 mg/kg | INTRAVENOUS | Status: DC
  Administered 2014-08-28 – 2014-08-30 (×3): 35 g via INTRAVENOUS
  Filled 2014-08-28 (×5): qty 350

## 2014-08-28 MED ORDER — FLUCONAZOLE IN SODIUM CHLORIDE 200-0.9 MG/100ML-% IV SOLN
200.0000 mg | INTRAVENOUS | Status: DC
  Administered 2014-08-28 – 2014-08-31 (×4): 200 mg via INTRAVENOUS
  Filled 2014-08-28 (×4): qty 100

## 2014-08-28 MED ORDER — PANTOPRAZOLE SODIUM 40 MG IV SOLR
40.0000 mg | INTRAVENOUS | Status: DC
  Administered 2014-08-28 – 2014-08-31 (×4): 40 mg via INTRAVENOUS
  Filled 2014-08-28 (×4): qty 40

## 2014-08-28 MED ORDER — FUROSEMIDE 10 MG/ML IJ SOLN
80.0000 mg | Freq: Two times a day (BID) | INTRAMUSCULAR | Status: DC
  Administered 2014-08-28 – 2014-08-29 (×4): 80 mg via INTRAVENOUS
  Filled 2014-08-28 (×6): qty 8

## 2014-08-28 NOTE — Progress Notes (Signed)
Dr. Roxy Manns at bedside assessing patient at 16. Code stroke initiated.  Will continue to monitor pt closely.

## 2014-08-28 NOTE — Progress Notes (Signed)
Dr. Roxy Manns notified of pt symptoms:  Slurred speech, difficulty getting words out, bilateral hand/lower arm tremors and inability to grip with hands.  Will continue to monitor pt closely.

## 2014-08-28 NOTE — Progress Notes (Signed)
Admit: 07/20/2014 LOS: 56  64M w/ AoCKD 2/2 ATN (BL SCr 1.5) in setting of enterococcal IE, candidemia, s/p: AVR, MVR, CABG, MAZE on CRRT 9/8-9/14/15, last HD 08/19/14  Subjective:  Stable GFR Now on PO Lasix Matching Is and Os Walking with PT For aranesp today On IVIG for TCP  09/24 0701 - 09/25 0700 In: 1100 [P.O.:600; I.V.:350; IV Piggyback:150] Out: 975 [Urine:975]  Filed Weights   08/26/14 0300 08/27/14 0300 08/28/14 0300  Weight: 81.6 kg (179 lb 14.3 oz) 81.6 kg (179 lb 14.3 oz) 81.8 kg (180 lb 5.4 oz)    Scheduled Meds: . ampicillin (OMNIPEN) IV  2 g Intravenous 3 times per day  . antiseptic oral rinse  7 mL Mouth Rinse BID  . aspirin EC  81 mg Oral Daily  . atorvastatin  10 mg Oral QPM  . cefTRIAXone (ROCEPHIN)  IV  2 g Intravenous Q12H  . darbepoetin (ARANESP) injection - NON-DIALYSIS  100 mcg Subcutaneous Q Fri-1800  . feeding supplement (ENSURE COMPLETE)  237 mL Oral BID BM  . fluconazole  200 mg Oral Daily  . folic acid  2 mg Oral Daily  . furosemide  120 mg Oral BID  . IMMUNE GLOBULIN 10% (HUMAN) IV - For Fluid Restriction Only  400 mg/kg Intravenous Q24H  . insulin aspart  0-15 Units Subcutaneous TID WC  . insulin aspart  0-5 Units Subcutaneous QHS  . pantoprazole  40 mg Oral Daily  . pyridOXINE  100 mg Oral QPM  . vitamin B-12  1,000 mcg Oral QPM  . vitamin E  400 Units Oral Daily  . warfarin  1 mg Oral q1800  . Warfarin - Physician Dosing Inpatient   Does not apply q1800   Continuous Infusions:  PRN Meds:.acetaminophen, docusate sodium, heparin, levalbuterol, ondansetron (ZOFRAN) IV, ondansetron (ZOFRAN) IV, ondansetron, promethazine, traMADol, zolpidem   Physical Exam:  Blood pressure 131/65, pulse 84, temperature 97.8 F (36.6 C), temperature source Oral, resp. rate 22, height 5\' 11"  (1.803 m), weight 81.8 kg (180 lb 5.4 oz), SpO2 98.00%. NAD, looks more energetic Nl S1S2 CTAB Ant auscultation S/nt/nd Minimal LEE  Assessment 1. AoCKD (BL SCr 1.5)  2/2 ATN, off HD since 08/19/14; Stable UOP on PO lasix and Stable GFR w/ good electrolyte control.  No changes 2. Anemia, multifactorial, on aranesp 100 qFri 3. S/p AVR, MVR, CABG, MAZE: per TCTS and Cardiology 4. Enterococcal IE and Fungemia: RCID following; on ampicillin/ceftriaxone 5. Pleural effusiosn w/ pleurex catheters 6. TCP, hematology following, started IVIG 08/27/14  Plan 1. Stable GFR, might be his new or at least current baseline   2. No change to diuretics  Pearson Grippe MD 08/28/2014, 8:23 AM   Recent Labs Lab 08/26/14 0650 08/27/14 0357 08/28/14 0320  NA 137 138 140  K 4.3 4.2 3.8  CL 99 97 98  CO2 26 27 25   GLUCOSE 86 83 84  BUN 64* 70* 68*  CREATININE 2.55* 2.73* 2.69*  CALCIUM 8.3* 8.2* 8.0*  PHOS 4.2 4.8* 4.7*    Recent Labs Lab 08/26/14 0650 08/27/14 0357 08/28/14 0320  WBC 5.4 5.2 4.4  HGB 9.5* 9.1* 9.1*  HCT 30.1* 29.1* 28.4*  MCV 101.7* 100.0 99.6  PLT 35* 29* 25*

## 2014-08-28 NOTE — Progress Notes (Signed)
It looks like Mr. No had a stroke this morning. There is some right-sided weakness. He has some speech difficulties. He had a CT scan of the brain. This did not show any bleed. It looks like he has had some and cerebellar infarcts.  His platelet count was only 25,000. He did get Nplate. This has not done much for his thrombocytopenia.Marland Kitchen His hemoglobin is stable at 9.1. His creatinine is 2.69.  He is on Coumadin. Is on 2 mg a day. He is IR is on 1.27.  I would think that given his low platelet count, that a thrombotic event would be unusual.  He is on Aranesp. This might be a risk factor. I probably would hold off on Aranesp in the future.  This has definitely has not helped him. I thought that he might be able to go to rehabilitation. Unfortunately, he is not going to be a candidate.  His albumin is down. I am sure that his pre-albumin is quite low.  He is still in a paced rhythm.  His vital signs are stable. His blood pressure is 136/65. Temperature 98.2. Pulse is 80. His speech is somewhat difficult to understand. He does have some weakness on his right side. His lungs sound clear. His cardiac exam is regular in rhythm.  One concern would be the possibility of embolic strokes from endocarditis. He is on ampicillin and Rocephin. He continues on Diflucan.  I suppose that an MRI of the brain might help.  We will continue follow along.  I will continue to pray hard for him.  Pete E.  Joshua 1:9

## 2014-08-28 NOTE — Evaluation (Signed)
Clinical/Bedside Swallow Evaluation Patient Details  Name: Justin [name] MRN: [id number] Date of Birth: 1932-12-28  Today's Date: 08/28/2014 Time: 9629-5284 SLP Time Calculation (min): 28 min  Past Medical History:  Past Medical History  Diagnosis Date  . CAD (coronary artery disease) prior stenting 1999   . Dyslipidemia   . HTN (hypertension)   . WPW (Wolff-Parkinson-White syndrome) loss of preexcitation   . Atrial fibrillation   . AV block, Mobitz II   . Presyncope   . Myocardial infarction   . Pacemaker 04/07/2013  . Arthritis   . History of chicken pox   . Anemia   . Hypernatremia 06/03/2013  . Thrombocytopenia, unspecified 06/03/2013  . Leukopenia 06/03/2013  . Obstructive sleep apnea 06/03/2013    USES CPAP  . Urinary incontinence 06/03/2013  . Hyperglycemia 12/21/2013  . Pancytopenia 06/03/2013  . Iron deficiency anemia, unspecified 07/02/2014  . Malabsorption of iron 07/02/2014  . Hypotestosteronemia 07/02/2014  . ITP (idiopathic thrombocytopenic purpura) 07/02/2014    possible - although patient had bacterial endocarditis at the time of diagnosis  . Aortic insufficiency   . Chronic kidney disease   . Bacterial endocarditis      Enterococcus sepsis with aortic valve vegetation  . UTI (urinary tract infection) 07/16/2014    ENTEROBACTER CLOACAE   . S/P aortic valve and mitral valve replacement 07/10/2014    21 mm Medtronic Freestyle porcine aortic root graft 29 mm [facility] Mitral bovine bioprosthetic mitral valve   . S/P aortic valve replacement with stentless valve 07/06/2014    21 mm Medtronic Freestyle porcine aortic root graft with reimplantation of left main and right coronary arteries  . S/P mitral valve replacement with bioprosthetic valve 07/30/2014    29 mm [facility] Mitral bovine bioprosthetic tissue valve  . S/P CABG x 1 07/28/2014    SVG to OM1 with EVH via right thigh  . S/P Maze operation for atrial fibrillation 08/02/2014    Complete bilateral atrial lesion  set using cryothermy and bipolar radiofrequency ablation with clipping of LA appendage  . Acute renal failure superimposed on stage 3 chronic kidney disease 07/27/2014  . Acute on chronic diastolic heart failure 1/32/4401  . Pleural effusions, bilateral, recurrent    Past Surgical History:  Past Surgical History  Procedure Laterality Date  . Coronary angioplasty with stent placement    . Vastectomy    . Insert / replace / remove pacemaker  04/07/2013  . Tee without cardioversion N/A 07/04/2014    Procedure: TRANSESOPHAGEAL ECHOCARDIOGRAM (TEE);  Surgeon: Lelon Perla, MD;  Location: [place];  Service: Cardiovascular;  Laterality: N/A;  . Aortic valve replacement N/A 08/02/2014    Procedure: ROOT REPLACEMENT WITH BIOPROSTHETIC PORCINE AORTIC ROOT REIMPLANTATION OF LEFT MAIN AND RIGHT CORONARY ARTERIES;  Surgeon: Rexene Alberts, MD;  Location: [place];  Service: Open Heart Surgery;  Laterality: N/A;  . Intraoperative transesophageal echocardiogram N/A 07/17/2014    Procedure: INTRAOPERATIVE TRANSESOPHAGEAL ECHOCARDIOGRAM;  Surgeon: Rexene Alberts, MD;  Location: [place];  Service: Open Heart Surgery;  Laterality: N/A;  . Maze N/A 07/04/2014    Procedure: MAZE;  Surgeon: Rexene Alberts, MD;  Location: [place];  Service: Open Heart Surgery;  Laterality: N/A;  . Coronary artery bypass graft N/A 07/08/2014    Procedure: CORONARY ARTERY BYPASS GRAFTING (CABG), on pump, times one, using right greater saphenous vein harvested endoscopically.;  Surgeon: Rexene Alberts, MD;  Location: [place];  Service: Open Heart Surgery;  Laterality: N/A;  .  Mitral valve replacement N/A 07/06/2014    Procedure: MITRAL VALVE (MV) REPLACEMENT;  Surgeon: Rexene Alberts, MD;  Location: [place];  Service: Open Heart Surgery;  Laterality: N/A;  . Chest tube insertion Bilateral 08/10/2014    Procedure: INSERTION PLEURAL DRAINAGE CATHETER;  Surgeon: Rexene Alberts, MD;  Location: [facility];  Service: Thoracic;  Laterality: Bilateral;   bilateral Pleur-X catheter placement   HPI:  Pt is a 80 y.o. male with history of CAD status post stents, hypertension, dyslipidemia, a-fib, pacemaker, ITP currently being treated with IVIG, who presents to the emergency department with complaints of fever and shortness of breath. 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 Pt underwent pacer placement on 8/28. Pt was found with new onset of slurred speech, difficulty expressing himself, and weakness that began in the morning of 9/25. Prior to this event patient was tolerating regular textures and thin liquids, however did not pass the RN stroke swallow screen due to coughing with thin liquids. Per protocol, SLP bedside swallow evaluation was ordered.   Assessment / Plan / Recommendation Clinical Impression  Pt presents with mild right-sided weakness that results in oral residuals with regular textures, which pt cleared with a lingual sweep with Mod I. Suspected delayed swallow initiation resulted in immediate cough s/p straw sip of thin liquids, with cough eliminated with SLP intervention for removal of straw. SLP provided Min cues for small, controlled cup sips. Given the above as well as patient fatigue, recommend Dys 3 diet and thin liquids by cup sips with SLP f/u for tolerance. Recommend that MD order SLP cognitive-linguistic evaluation for signs of aphasia as well.    Aspiration Risk  Moderate    Diet Recommendation Dysphagia 3 (Mechanical Soft);Thin liquid   Liquid Administration via: Cup;No straw Medication Administration: Whole meds with puree Supervision: Staff to assist with self feeding;Full supervision/cueing for compensatory strategies Compensations: Slow rate;Small sips/bites;Check for pocketing Postural Changes and/or Swallow Maneuvers: Seated upright 90 degrees    Other  Recommendations Oral Care Recommendations: Oral care BID   Follow Up Recommendations  Skilled  Nursing facility    Frequency and Duration min 2x/week  2 weeks   Pertinent Vitals/Pain n/a    SLP Swallow Goals     Swallow Study Prior Functional Status       General Date of Onset: 08/28/14 HPI: Pt is a 80 y.o. male with history of CAD status post stents, hypertension, dyslipidemia, a-fib, pacemaker, ITP currently being treated with IVIG, who presents to the emergency department with complaints of fever and shortness of breath. 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 Pt underwent pacer placement on 8/28. Pt was found with new onset of slurred speech, difficulty expressing himself, and weakness that began in the morning of 9/25. Prior to this event patient was tolerating regular textures and thin liquids, however did not pass the RN stroke swallow screen due to coughing with thin liquids. Per protocol, SLP bedside swallow evaluation was ordered. Type of Study: Bedside swallow evaluation Previous Swallow Assessment: none in chart Diet Prior to this Study: NPO Temperature Spikes Noted: No Respiratory Status: Room air Behavior/Cognition: Alert;Cooperative;Pleasant mood;Other (comment) (aphasia) Oral Cavity - Dentition: Adequate natural dentition;Missing dentition Self-Feeding Abilities: Needs assist Patient Positioning: Upright in bed Baseline Vocal Quality: Clear Volitional Cough: Weak Volitional Swallow: Able to elicit    Oral/Motor/Sensory Function Overall Oral Motor/Sensory Function: Impaired Labial ROM: Reduced right  Labial Symmetry: Abnormal symmetry right Labial Strength: Within Functional Limits Labial Sensation: Within Functional Limits Lingual ROM: Within Functional Limits Lingual Symmetry: Within Functional Limits Lingual Strength: Within Functional Limits Facial ROM: Reduced right Facial Symmetry: Right droop;Right drooping eyelid Facial Strength: Reduced Velum: Within Functional Limits Mandible:  Within Functional Limits   Ice Chips Ice chips: Not tested   Thin Liquid Thin Liquid: Impaired Presentation: Cup;Self Fed;Straw Pharyngeal  Phase Impairments: Suspected delayed Swallow;Cough - Immediate    Nectar Thick Nectar Thick Liquid: Not tested   Honey Thick Honey Thick Liquid: Not tested   Puree Puree: Within functional limits Presentation: Spoon   Solid   GO    Solid: Impaired Oral Phase Functional Implications: Oral residue;Other (comment) (pt utilized lingual sweep, liquid washq with Mod I)        Germain Osgood, M.A. CCC-SLP [phone]  Germain Osgood 08/28/2014,11:46 AM

## 2014-08-28 NOTE — Progress Notes (Addendum)
Subjective:  Patient has developed sudden onset dysarthria; no chest pain or dyspnea.   Objective:   Scheduled Meds: . ampicillin (OMNIPEN) IV  2 g Intravenous 3 times per day  . antiseptic oral rinse  7 mL Mouth Rinse BID  . aspirin EC  81 mg Oral Daily  . atorvastatin  10 mg Oral QPM  . cefTRIAXone (ROCEPHIN)  IV  2 g Intravenous Q12H  . darbepoetin (ARANESP) injection - NON-DIALYSIS  100 mcg Subcutaneous Q Fri-1800  . feeding supplement (ENSURE COMPLETE)  237 mL Oral BID BM  . fluconazole  200 mg Oral Daily  . folic acid  2 mg Oral Daily  . furosemide  120 mg Oral BID  . IMMUNE GLOBULIN 10% (HUMAN) IV - For Fluid Restriction Only  400 mg/kg Intravenous Q24H  . insulin aspart  0-15 Units Subcutaneous TID WC  . insulin aspart  0-5 Units Subcutaneous QHS  . pantoprazole  40 mg Oral Daily  . pyridOXINE  100 mg Oral QPM  . vitamin B-12  1,000 mcg Oral QPM  . vitamin E  400 Units Oral Daily  . warfarin  2 mg Oral q1800  . Warfarin - Physician Dosing Inpatient   Does not apply q1800   Continuous Infusions:   PRN Meds:.acetaminophen, docusate sodium, levalbuterol, ondansetron (ZOFRAN) IV, ondansetron (ZOFRAN) IV, ondansetron, promethazine, traMADol, zolpidem  Filed Vitals:   08/28/14 0200 08/28/14 0300 08/28/14 0400 08/28/14 0753  BP: 128/56  123/58 131/65  Pulse:  84    Temp:  98.5 F (36.9 C)  97.8 F (36.6 C)  TempSrc:  Oral  Oral  Resp: 20 24 24 22   Height:      Weight:  180 lb 5.4 oz (81.8 kg)    SpO2:  96%  98%    Intake/Output from previous day:  Intake/Output Summary (Last 24 hours) at 08/28/14 0945 Last data filed at 08/28/14 0500  Gross per 24 hour  Intake    740 ml  Output    975 ml  Net   -235 ml    Physical Exam:  General: Frail, NAD Skin is warm and dry.  HEENT is normal.  Neck is supple Chest diminished BS bases Cor Regualr rate and rhythm.  Abdominal exam nontender or distended. No masses palpated. Extremities show 1+ edema neuro  Patient with dysarthria  Tele : NSR 70-80s V pacing   Lab Results: Basic Metabolic Panel:  Recent Labs  08/27/14 0357 08/28/14 0320  NA 138 140  K 4.2 3.8  CL 97 98  CO2 27 25  GLUCOSE 83 84  BUN 70* 68*  CREATININE 2.73* 2.69*  CALCIUM 8.2* 8.0*  PHOS 4.8* 4.7*   CBC:  Recent Labs  08/27/14 0357 08/28/14 0320  WBC 5.2 4.4  HGB 9.1* 9.1*  HCT 29.1* 28.4*  MCV 100.0 99.6  PLT 29* 25*     Assessment/Plan:  1. Status post aortic root replacement, bioprosthetic aortic valve replacement, bioprosthetic mitral valve replacement, coronary artery bypass graft (to LCx system) and maze. 2. Acute on chronic kidney failure-nephrology following.  3. Thrombocytopenia- ITP.  Hematology following. 4. Paroxysmal atrial fibrillation-Sinus this AM 5. CAD-continue aspirin and statin. Had SVG-LCx system with recent cardiac surgery.  6. Status post pacemaker for CHB 7. Enterococcal endocarditis 8. Acute diastolic CHF with prominent RV failure.  9. Acute respiratory failure-resolved 10. Fungemia/funguria 11. Pleural effusions 12. Probable new CVA  PLAN/DISCUSSION  Patient with dysarthria this AM consistent with new CVA; stroke team evaluating;  await CT results (preliminary shows no bleed). This is most likely related to embolic event either from PAF or vegetation on AVR or MVR. Options are limited. Agree with Dr Roxy Manns that heparin/lovenox should be avoided given severe thrombocytopenia. Continue coumadin (discussed with neurology; CVA at this point not felt to be large with associated increased risk of hemorrhagic transformation). Continue antibiotics. TEE could be performed for prognostic purposes but not likely to alter plan (patient clearly not a surgical candidate if he has recurrent vegetations and already receiving antibiotics and anticoagulation). Continue antibiotics for recent enterococcal antibiotics and fungemia. Nephrology following and managing diuretics. Volume status at  present is reasonable. Hematology continues to follow for thrombocytopenia.  Justin [name] 9:45 AM  Discussed with Dr Roxy Manns; agree that TEE would be helpful if vegetations identified as this would affect duration of antibiotics. Will try and arrange next week. Kirk Ruths

## 2014-08-28 NOTE — Progress Notes (Signed)
Justin [name], [place] notified of pt difficulty getting words out, bilateral hand tremors, unable to grip items, and right hand slightly weaker than left.  Instructed to page/notify Dr. Roxy Manns.  Will continue to monitor pt closely.

## 2014-08-28 NOTE — Progress Notes (Signed)
Referring Physician: Roxy Manns    Chief Complaint: Code stroke  HPI:                                                                                                                                         Justin [name] is an 80 y.o. male admitted to  hospital on 07/10/2014 for heart failure and hypotension.  He is currently being followed by Dr. Marin Olp for ITP and treated with systemic steroids and was treated with IVIG. While in hospital he was noted to have both enterococcus and Enterobacter in his urine and blood. 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 On 07/18/2014 he underwent a Aortic root replacement, MV replacement, CABG and Maze procedure. While hospitalized he developed ATN and required hemodialysis.  His kidney function improved and has been of HD since 08-19-2014.  Patient was improving slowly.  This AM night nurse check on him and placed him I the chair at 0700 hours.  At that time he was his baseline.  Morning nurse came to check on him and noted he was having difficulty expressing himself and on exam she noted new onset of right arm weakness.  Code stroke was called. Patient was brought to CT where no acute infarct was found but did show bilateral cerebellar lacunar infarcts increased in number since August. Patient was not a tPA candidate due to PT >15 and PLT count of 25.   Date last known well: Date: 08/28/2014 Time last known well: Time: 07:00 tPA Given: No: PLT 25, PT >15.9  Past Medical History  Diagnosis Date  . CAD (coronary artery disease) prior stenting 1999   . Dyslipidemia   . HTN (hypertension)   . WPW (Wolff-Parkinson-White syndrome) loss of preexcitation   . Atrial fibrillation   . AV block, Mobitz II   . Presyncope   . Myocardial infarction   . Pacemaker 04/07/2013  .  Arthritis   . History of chicken pox   . Anemia   . Hypernatremia 06/03/2013  . Thrombocytopenia, unspecified 06/03/2013  . Leukopenia 06/03/2013  . Obstructive sleep apnea 06/03/2013    USES CPAP  . Urinary incontinence 06/03/2013  . Hyperglycemia 12/21/2013  . Pancytopenia 06/03/2013  . Iron deficiency anemia, unspecified 07/02/2014  . Malabsorption of iron 07/02/2014  . Hypotestosteronemia 07/02/2014  . ITP (idiopathic thrombocytopenic purpura) 07/02/2014    possible - although patient had bacterial endocarditis at the time of diagnosis  . Aortic insufficiency   . Chronic kidney disease   . Bacterial endocarditis      Enterococcus sepsis with aortic valve vegetation  . UTI (urinary tract infection) 07/19/2014    ENTEROBACTER CLOACAE   . S/P aortic valve and mitral valve replacement 07/28/2014    21 mm Medtronic Freestyle porcine  aortic root graft 29 mm [facility] Mitral bovine bioprosthetic mitral valve   . S/P aortic valve replacement with stentless valve 08/02/2014    21 mm Medtronic Freestyle porcine aortic root graft with reimplantation of left main and right coronary arteries  . S/P mitral valve replacement with bioprosthetic valve 07/20/2014    29 mm [facility] Mitral bovine bioprosthetic tissue valve  . S/P CABG x 1 08/02/2014    SVG to OM1 with EVH via right thigh  . S/P Maze operation for atrial fibrillation 07/21/2014    Complete bilateral atrial lesion set using cryothermy and bipolar radiofrequency ablation with clipping of LA appendage  . Acute renal failure superimposed on stage 3 chronic kidney disease 07/27/2014  . Acute on chronic diastolic heart failure 6/76/7209  . Pleural effusions, bilateral, recurrent     Past Surgical History  Procedure Laterality Date  . Coronary angioplasty with stent placement    . Vastectomy    . Insert / replace / remove pacemaker  04/07/2013  . Tee without cardioversion N/A 07/25/2014    Procedure: TRANSESOPHAGEAL ECHOCARDIOGRAM (TEE);  Surgeon:  Lelon Perla, MD;  Location: [place];  Service: Cardiovascular;  Laterality: N/A;  . Aortic valve replacement N/A 07/26/2014    Procedure: ROOT REPLACEMENT WITH BIOPROSTHETIC PORCINE AORTIC ROOT REIMPLANTATION OF LEFT MAIN AND RIGHT CORONARY ARTERIES;  Surgeon: Rexene Alberts, MD;  Location: [place];  Service: Open Heart Surgery;  Laterality: N/A;  . Intraoperative transesophageal echocardiogram N/A 07/14/2014    Procedure: INTRAOPERATIVE TRANSESOPHAGEAL ECHOCARDIOGRAM;  Surgeon: Rexene Alberts, MD;  Location: [place];  Service: Open Heart Surgery;  Laterality: N/A;  . Maze N/A 07/16/2014    Procedure: MAZE;  Surgeon: Rexene Alberts, MD;  Location: [place];  Service: Open Heart Surgery;  Laterality: N/A;  . Coronary artery bypass graft N/A 07/13/2014    Procedure: CORONARY ARTERY BYPASS GRAFTING (CABG), on pump, times one, using right greater saphenous vein harvested endoscopically.;  Surgeon: Rexene Alberts, MD;  Location: [place];  Service: Open Heart Surgery;  Laterality: N/A;  . Mitral valve replacement N/A 07/23/2014    Procedure: MITRAL VALVE (MV) REPLACEMENT;  Surgeon: Rexene Alberts, MD;  Location: [place];  Service: Open Heart Surgery;  Laterality: N/A;  . Chest tube insertion Bilateral 08/05/2014    Procedure: INSERTION PLEURAL DRAINAGE CATHETER;  Surgeon: Rexene Alberts, MD;  Location[facility];  Service: Thoracic;  Laterality: Bilateral;  bilateral Pleur-X catheter placement    Family History  Problem Relation Age of Onset  . Stroke Mother   . Hypertension Mother   . Stroke Sister   . Arthritis Sister     rheumatoid  . Heart attack Sister   . Anemia Sister   . Other Brother     tube put in aorta  . Anemia Brother   . Other Son     cortisone deficiency  . Arthritis Son   . Stroke Brother   . Alcohol abuse Brother   . Barrett's esophagus Son   . Colon cancer Maternal Grandmother   . Colon cancer Maternal Grandfather    Social History:  reports that he quit smoking about 22  years ago. His smoking use included Cigarettes. He started smoking about 59 years ago. He has a 36 pack-year smoking history. He has never used smokeless tobacco. He reports that he drinks alcohol. He reports that he does not use illicit drugs.  Allergies: No Known Allergies  Medications:  Prior to Admission:  Prescriptions prior to admission  Medication Sig Dispense Refill  . atorvastatin (LIPITOR) 10 MG tablet Take 1 tablet (10 mg total) by mouth every evening.  90 tablet  3  . Azelaic Acid (FINACEA) 15 % cream Apply 1 application topically daily. After skin is thoroughly washed and patted dry, gently but thoroughly massage a thin film of azelaic acid creame      . Cholecalciferol (VITAMIN D-3) 1000 UNITS CAPS Take 1 capsule by mouth daily.      . furosemide (LASIX) 20 MG tablet Take 3 tablets (60 mg total) by mouth daily.  90 tablet  12  . losartan (COZAAR) 50 MG tablet Take 1 tablet (50 mg total) by mouth daily.  90 tablet  3  . Multiple Vitamin (MULTIVITAMIN WITH MINERALS) TABS Take 1 tablet by mouth 2 (two) times a week.       . nadolol (CORGARD) 20 MG tablet Take 10 mg by mouth daily.      . nitroGLYCERIN (NITROSTAT) 0.4 MG SL tablet Place 1 tablet (0.4 mg total) under the tongue every 5 (five) minutes as needed for chest pain. x3 doses as needed for chest pain  25 tablet  12  . omeprazole (PRILOSEC) 20 MG capsule Take 20 mg by mouth daily as needed (for heartburn).       Vladimir Faster Glycol-Propyl Glycol (SYSTANE ULTRA OP) Apply 1 drop to eye 4 (four) times daily as needed (for dry eyes).      . predniSONE (DELTASONE) 20 MG tablet Take 80 mg by mouth daily with breakfast.      . Probiotic Product (PROBIOTIC DAILY PO) Take 1 capsule by mouth daily.       Marland Kitchen pyridOXINE (VITAMIN B-6) 100 MG tablet Take 50 mg by mouth every evening.       . tamsulosin (FLOMAX) 0.4 MG CAPS  capsule Take 0.4 mg by mouth at bedtime.      . vitamin B-12 (CYANOCOBALAMIN) 1000 MCG tablet Take 1,000 mcg by mouth every evening.      . Vitamin E (VITA-PLUS E PO) Take 1 capsule by mouth daily.       Scheduled: . ampicillin (OMNIPEN) IV  2 g Intravenous 3 times per day  . antiseptic oral rinse  7 mL Mouth Rinse BID  . aspirin EC  81 mg Oral Daily  . atorvastatin  10 mg Oral QPM  . cefTRIAXone (ROCEPHIN)  IV  2 g Intravenous Q12H  . darbepoetin (ARANESP) injection - NON-DIALYSIS  100 mcg Subcutaneous Q Fri-1800  . feeding supplement (ENSURE COMPLETE)  237 mL Oral BID BM  . fluconazole  200 mg Oral Daily  . folic acid  2 mg Oral Daily  . furosemide  120 mg Oral BID  . IMMUNE GLOBULIN 10% (HUMAN) IV - For Fluid Restriction Only  400 mg/kg Intravenous Q24H  . insulin aspart  0-15 Units Subcutaneous TID WC  . insulin aspart  0-5 Units Subcutaneous QHS  . pantoprazole  40 mg Oral Daily  . pyridOXINE  100 mg Oral QPM  . vitamin B-12  1,000 mcg Oral QPM  . vitamin E  400 Units Oral Daily  . warfarin  2 mg Oral q1800  . Warfarin - Physician Dosing Inpatient   Does not apply q1800    ROS:  History obtained from the patient  General ROS: negative for - chills, fatigue, fever, night sweats, weight gain or weight loss Psychological ROS: negative for - behavioral disorder, hallucinations, memory difficulties, mood swings or suicidal ideation Ophthalmic ROS: negative for - blurry vision, double vision, eye pain or loss of vision ENT ROS: negative for - epistaxis, nasal discharge, oral lesions, sore throat, tinnitus or vertigo Allergy and Immunology ROS: negative for - hives or itchy/watery eyes Hematological and Lymphatic ROS: negative for - bleeding problems, bruising or swollen lymph nodes Endocrine ROS: negative for - galactorrhea, hair pattern changes,  polydipsia/polyuria or temperature intolerance Respiratory ROS: negative for - cough, hemoptysis, shortness of breath or wheezing Cardiovascular ROS: negative for - chest pain, dyspnea on exertion, edema or irregular heartbeat Gastrointestinal ROS: negative for - abdominal pain, diarrhea, hematemesis, nausea/vomiting or stool incontinence Genito-Urinary ROS: negative for - dysuria, hematuria, incontinence or urinary frequency/urgency Musculoskeletal ROS: negative for - joint swelling or muscular weakness Neurological ROS: as noted in HPI Dermatological ROS: negative for rash and skin lesion changes  Neurologic Examination:                                                                                                      Blood pressure 131/65, pulse 84, temperature 98.2 F (36.8 C), temperature source Oral, resp. rate 22, height 5\' 11"  (1.803 m), weight 81.8 kg (180 lb 5.4 oz), SpO2 98.00%.   General: NAD Mental Status: Alert, oriented, thought content appropriate.  Speech dysarthric and shows expressive aphasia.  Able to follow 3 step commands without difficulty. Cranial Nerves: II: Discs flat bilaterally; Visual fields grossly normal, pupils equal, round, reactive to light and accommodation III,IV, VI: ptosis not present, extra-ocular motions intact bilaterally V,VII: smile asymmetric on the right, facial light touch sensation normal bilaterally VIII: hearing normal bilaterally IX,X: gag reflex present XI: bilateral shoulder shrug XII: midline tongue extension without atrophy or fasciculations  Motor: Right : Upper extremity   5/5    Left:     Upper extremity   5/5  Lower extremity   5/5     Lower extremity   5/5 Tone and bulk:normal tone throughout; no atrophy noted Sensory: Pinprick and light touch intact throughout, bilaterally Deep Tendon Reflexes:  1+ throughout with non AJ  Plantars: Right: downgoing   Left: downgoing Cerebellar: normal finger-to-nose,  normal  heel-to-shin test Gait: not tested.  CV: pulses palpable throughout    Lab Results: Basic Metabolic Panel:  Recent Labs Lab 08/24/14 0342 08/25/14 0527 08/26/14 0650 08/27/14 0357 08/28/14 0320  NA 132* 133* 137 138 140  K 4.6 4.7 4.3 4.2 3.8  CL 94* 97 99 97 98  CO2 24 23 26 27 25   GLUCOSE 83 171* 86 83 84  BUN 63* 64* 64* 70* 68*  CREATININE 2.82* 2.65* 2.55* 2.73* 2.69*  CALCIUM 8.3* 8.1* 8.3* 8.2* 8.0*  PHOS 4.3 4.0 4.2 4.8* 4.7*    Liver Function Tests:  Recent Labs Lab 08/24/14 0342 08/25/14 0527 08/26/14 0650 08/27/14 0357 08/28/14 0320  ALBUMIN 1.9* 1.9* 1.9* 1.9* 1.8*  No results found for this basename: LIPASE, AMYLASE,  in the last 168 hours No results found for this basename: AMMONIA,  in the last 168 hours  CBC:  Recent Labs Lab 08/24/14 0342 08/25/14 0527 08/26/14 0650 08/27/14 0357 08/28/14 0320  WBC 6.3 7.1 5.4 5.2 4.4  HGB 9.3* 9.5* 9.5* 9.1* 9.1*  HCT 29.5* 30.5* 30.1* 29.1* 28.4*  MCV 100.0 101.0* 101.7* 100.0 99.6  PLT 72* 51* 35* 29* 25*    Cardiac Enzymes: No results found for this basename: CKTOTAL, CKMB, CKMBINDEX, TROPONINI,  in the last 168 hours  Lipid Panel: No results found for this basename: CHOL, TRIG, HDL, CHOLHDL, VLDL, LDLCALC,  in the last 168 hours  CBG:  Recent Labs Lab 08/27/14 0732 08/27/14 1155 08/27/14 1817 08/27/14 2135 08/28/14 0756  GLUCAP 90 155* 135* 73 97    Microbiology: Results for orders placed during the hospital encounter of 07/20/2014  CULTURE, BLOOD (ROUTINE X 2)     Status: None   Collection Time    07/27/2014  3:30 PM      Result Value Ref Range Status   Specimen Description BLOOD LEFT ARM   Final   Special Requests BOTTLES DRAWN AEROBIC AND ANAEROBIC [facility]   Final   Culture  Setup Time     Final   Value: 07/31/2014 19:23     Performed at [facility]   Culture     Final   Value: ENTEROCOCCUS SPECIES     Note: SUSCEPTIBILITIES PERFORMED ON PREVIOUS CULTURE WITHIN THE LAST  5 DAYS.     Note: Gram Stain Report Called to,Read Back By and Verified With: JAMES ARTIS 07/07/14 AT 0730 RIDK     Performed at [facility]   Report Status 07/09/2014 FINAL   Final  URINE CULTURE     Status: None   Collection Time    07/21/2014  4:18 PM      Result Value Ref Range Status   Specimen Description URINE, CLEAN CATCH   Final   Special Requests NONE   Final   Culture  Setup Time     Final   Value: 07/07/2014 02:46     Performed at [place]     Final   Value: 35,000 COLONIES/ML     Performed at [facility]   Culture     Final   Value: ENTEROBACTER CLOACAE     Performed at [facility]   Report Status 07/09/2014 FINAL   Final   Organism ID, Bacteria ENTEROBACTER CLOACAE   Final  CULTURE, BLOOD (ROUTINE X 2)     Status: None   Collection Time    07/26/2014  4:30 PM      Result Value Ref Range Status   Specimen Description BLOOD R ARM   Final   Special Requests BOTTLES DRAWN AEROBIC AND ANAEROBIC [place]   Final   Culture  Setup Time     Final   Value: 07/31/2014 19:22     Performed at [facility]   Culture     Final   Value: ENTEROCOCCUS SPECIES     Note: Gram Stain Report Called to,Read Back By and Verified With: JAMES ARTIS 07/07/14 AT 0730 [place]     Performed at [facility]   Report Status 07/09/2014 FINAL   Final   Organism ID, Bacteria ENTEROCOCCUS SPECIES   Final  MRSA PCR SCREENING     Status: None   Collection  Time    07/30/2014  7:52 PM      Result Value Ref Range Status   MRSA by PCR NEGATIVE  NEGATIVE Final   Comment:            The GeneXpert MRSA Assay (FDA     approved for NASAL specimens     only), is one component of a     comprehensive MRSA colonization     surveillance program. It is not     intended to diagnose MRSA     infection nor to guide or     monitor treatment for     MRSA infections.  CULTURE, BLOOD (ROUTINE X 2)     Status: None   Collection Time    07/08/14 12:00 PM       Result Value Ref Range Status   Specimen Description BLOOD RIGHT HAND   Final   Special Requests BOTTLES DRAWN AEROBIC ONLY [facility]   Final   Culture  Setup Time     Final   Value: 07/08/2014 17:14     Performed at [facility]   Culture     Final   Value: NO GROWTH 5 DAYS     Performed at [facility]   Report Status 07/30/2014 FINAL   Final  CULTURE, BLOOD (ROUTINE X 2)     Status: None   Collection Time    07/08/14 12:10 PM      Result Value Ref Range Status   Specimen Description BLOOD LEFT HAND   Final   Special Requests BOTTLES DRAWN AEROBIC ONLY [place]   Final   Culture  Setup Time     Final   Value: 07/08/2014 17:05     Performed at [facility]   Culture     Final   Value: NO GROWTH 5 DAYS     Performed at [facility]   Report Status 07/23/2014 FINAL   Final  MRSA PCR SCREENING     Status: None   Collection Time    07/23/2014  6:17 PM      Result Value Ref Range Status   MRSA by PCR NEGATIVE  NEGATIVE Final   Comment:            The GeneXpert MRSA Assay (FDA     approved for NASAL specimens     only), is one component of a     comprehensive MRSA colonization     surveillance program. It is not     intended to diagnose MRSA     infection nor to guide or     monitor treatment for     MRSA infections.  SURGICAL PCR SCREEN     Status: None   Collection Time    07/21/14 11:05 AM      Result Value Ref Range Status   MRSA, PCR NEGATIVE  NEGATIVE Final   Staphylococcus aureus NEGATIVE  NEGATIVE Final   Comment:            The Xpert SA Assay (FDA     approved for NASAL specimens     in patients over 48 years of age),     is one component of     a comprehensive surveillance     program.  Test performance has     been validated by [employer] for patients greater     than or equal to 25 year old.     It is not  intended     to diagnose infection nor to     guide or monitor treatment.  GRAM STAIN     Status: None   Collection  Time    07/16/2014  9:36 AM      Result Value Ref Range Status   Specimen Description TISSUE   Final   Special Requests AORTIC VALVE VEGETATION HEART PT ON ZINACEF VANCO   Final   Gram Stain     Final   Value: FEW WBC PRESENT,BOTH PMN AND MONONUCLEAR     ABUNDANT GRAM POSITIVE COCCI IN PAIRS   Report Status 08/02/2014 FINAL   Final  TISSUE CULTURE     Status: None   Collection Time    07/06/2014  9:36 AM      Result Value Ref Range Status   Specimen Description TISSUE OTHER   Final   Special Requests AORTIC VALVE VEGETATION   Final   Gram Stain     Final   Value: NO WBC SEEN     NO SQUAMOUS EPITHELIAL CELLS SEEN     NO ORGANISMS SEEN     Performed at [facility]   Culture     Final   Value: FEW ENTEROCOCCUS SPECIES     Performed at [facility]   Report Status 07/29/2014 FINAL   Final   Organism ID, Bacteria ENTEROCOCCUS SPECIES   Final  ANAEROBIC CULTURE     Status: None   Collection Time    07/11/2014  9:36 AM      Result Value Ref Range Status   Specimen Description TISSUE OTHER   Final   Special Requests AORTIC VALVE VEGETATION   Final   Gram Stain     Final   Value: NO WBC SEEN     NO SQUAMOUS EPITHELIAL CELLS SEEN     NO ORGANISMS SEEN     Performed at [facility]   Culture     Final   Value: NO ANAEROBES ISOLATED     Performed at [facility]   Report Status 07/29/2014 FINAL   Final  CLOSTRIDIUM DIFFICILE BY PCR     Status: None   Collection Time    07/19/2014  6:18 AM      Result Value Ref Range Status   C difficile by pcr NEGATIVE  NEGATIVE Final  CULTURE, BLOOD (ROUTINE X 2)     Status: None   Collection Time    08/11/14  8:35 AM      Result Value Ref Range Status   Specimen Description BLOOD RIGHT HAND   Final   Special Requests BOTTLES DRAWN AEROBIC ONLY [facility]   Final   Culture  Setup Time     Final   Value: 08/11/2014 14:03     Performed at [facility]   Culture     Final   Value: NO GROWTH 5 DAYS     Performed at  [facility]   Report Status 08/17/2014 FINAL   Final  CULTURE, BLOOD (ROUTINE X 2)     Status: None   Collection Time    08/11/14  8:45 AM      Result Value Ref Range Status   Specimen Description BLOOD RIGHT HAND   Final   Special Requests BOTTLES DRAWN AEROBIC ONLY [facility]   Final   Culture  Setup Time     Final   Value: 08/11/2014 14:03     Performed at [facility]  Final   Value: CANDIDA PARAPSILOSIS     Note: Gram Stain Report Called to,Read Back By and Verified With: CRYSTAL RICE 08/13/14 1120 BY SMITHERSJ     Performed at [facility]   Report Status 08/15/2014 FINAL   Final  URINE CULTURE     Status: None   Collection Time    08/11/14 11:44 AM      Result Value Ref Range Status   Specimen Description URINE, CATHETERIZED   Final   Special Requests Immunocompromised   Final   Culture  Setup Time     Final   Value: 08/11/2014 17:21     Performed at [place]     Final   Value: 65,000 COLONIES/ML     Performed at [facility]   Culture     Final   Value: CANDIDA ALBICANS     Performed at [facility]   Report Status 08/18/2014 FINAL   Final  AFB CULTURE WITH SMEAR     Status: None   Collection Time    08/12/14  4:33 PM      Result Value Ref Range Status   Specimen Description FLUID RIGHT PLEURAL   Final   Special Requests Normal   Final   Acid Fast Smear     Final   Value: NO ACID FAST BACILLI SEEN     Performed at [facility]   Culture     Final   Value: CULTURE WILL BE EXAMINED FOR 6 WEEKS BEFORE ISSUING A FINAL REPORT     Performed at [facility]   Report Status PENDING   Incomplete  BODY FLUID CULTURE     Status: None   Collection Time    08/12/14  4:42 PM      Result Value Ref Range Status   Specimen Description FLUID RIGHT PLEURAL   Final   Special Requests Normal   Final   Gram Stain     Final   Value: RARE WBC PRESENT,BOTH PMN AND MONONUCLEAR     NO ORGANISMS  SEEN     Performed at [facility]   Culture     Final   Value: NO GROWTH 3 DAYS     Performed at [facility]   Report Status 08/16/2014 FINAL   Final  FUNGUS CULTURE W SMEAR     Status: None   Collection Time    08/12/14  4:52 PM      Result Value Ref Range Status   Specimen Description FLUID RIGHT PLEURAL   Final   Special Requests NONE   Final   Fungal Smear     Final   Value: NO YEAST OR FUNGAL ELEMENTS SEEN     Performed at [facility]   Culture     Final   Value: CULTURE IN PROGRESS FOR FOUR WEEKS     Performed at [facility]   Report Status PENDING   Incomplete  BODY FLUID CULTURE     Status: None   Collection Time    08/13/14  3:00 PM      Result Value Ref Range Status   Specimen Description PLEURAL FLUID LEFT   Final   Special Requests NONE   Final   Gram Stain     Final   Value: NO WBC SEEN     NO ORGANISMS SEEN     Performed at [facility]  Final   Value: NO GROWTH 3 DAYS     Performed at [facility]   Report Status 08/16/2014 FINAL   Final  CULTURE, EXPECTORATED SPUTUM-ASSESSMENT     Status: None   Collection Time    08/13/14  6:02 PM      Result Value Ref Range Status   Specimen Description SPUTUM   Final   Special Requests Immunocompromised   Final   Sputum evaluation     Final   Value: MICROSCOPIC FINDINGS SUGGEST THAT THIS SPECIMEN IS NOT REPRESENTATIVE OF LOWER RESPIRATORY SECRETIONS. PLEASE RECOLLECT.     Loletha Carrow RN 2094 08/13/14 A BROWN[facility]   Report Status 08/13/2014 FINAL   Final  CULTURE, BLOOD (ROUTINE X 2)     Status: None   Collection Time    08/14/14 12:21 PM      Result Value Ref Range Status   Specimen Description BLOOD RIGHT FOREARM   Final   Special Requests BOTTLES DRAWN AEROBIC AND ANAEROBIC [facility]   Final   Culture  Setup Time     Final   Value: 08/14/2014 15:48     Performed at [facility]   Culture     Final   Value: NO GROWTH 5 DAYS     Performed at  [facility]   Report Status 08/19/2014 FINAL   Final  CULTURE, BLOOD (ROUTINE X 2)     Status: None   Collection Time    08/14/14  3:42 PM      Result Value Ref Range Status   Specimen Description BLOOD HEMODIALYSIS LINE   Final   Special Requests BOTTLES DRAWN AEROBIC AND ANAEROBIC [facility]   Final   Culture  Setup Time     Final   Value: 08/14/2014 18:57     Performed at [facility]   Culture     Final   Value: NO GROWTH 5 DAYS     Performed at [facility]   Report Status 08/11/2014 FINAL   Final  MRSA PCR SCREENING     Status: None   Collection Time    08/17/14 11:42 AM      Result Value Ref Range Status   MRSA by PCR NEGATIVE  NEGATIVE Final   Comment:            The GeneXpert MRSA Assay (FDA     approved for NASAL specimens     only), is one component of a     comprehensive MRSA colonization     surveillance program. It is not     intended to diagnose MRSA     infection nor to guide or     monitor treatment for     MRSA infections.  URINE CULTURE     Status: None   Collection Time    08/26/14  2:09 PM      Result Value Ref Range Status   Specimen Description URINE, RANDOM   Final   Special Requests NONE   Final   Culture  Setup Time     Final   Value: 08/26/2014 23:43     Performed at [facility]     Final   Value: NO GROWTH     Performed at [facility]   Culture     Final   Value: NO GROWTH     Performed at [facility]   Report Status 08/27/2014 FINAL   Final  Coagulation Studies:  Recent Labs  08/26/14 0650 08/27/14 0357 08/28/14 0320  LABPROT 16.5* 15.6* 15.9*  INR 1.33 1.24 1.27    Imaging: Ct Head Wo Contrast  08/28/2014   ADDENDUM REPORT: 08/28/2014 09:45  ADDENDUM: Study discussed by telephone with Dr. Alexis Goodell On 08/28/2014 at 0938 hrs.   Electronically Signed   By: Lars Pinks M.D.   On: 08/28/2014 09:45   08/28/2014   CLINICAL DATA:  80 year old male code stroke with  right side weakness and abnormal speech. Initial encounter.  EXAM: CT HEAD WITHOUT CONTRAST  TECHNIQUE: Contiguous axial images were obtained from the base of the skull through the vertex without intravenous contrast.  COMPARISON:  07/09/2014.  FINDINGS: Visualized paranasal sinuses and mastoids are clear. No acute osseous abnormality identified. Calcified atherosclerosis at the skull base. Visualized orbits and scalp soft tissues are within normal limits.  Stable cerebral volume, normal for age. No ventriculomegaly. No midline shift, mass effect, or evidence of intracranial mass lesion. No acute intracranial hemorrhage identified.  Multiple bilateral small peripheral lacunar infarcts in the cerebellar hemispheres greater on the left. These do appear increased in number since August (series 2, images 8 and 9). Other posterior fossa gray-white matter differentiation is within normal limits. No supratentorial acute cortically based infarct identified. No suspicious intracranial vascular hyperdensity. Supratentorial gray-white matter differentiation is within normal limits for age.  IMPRESSION: 1. Bilateral cerebellar lacunar infarcts, appear increased in number since August. 2. No other acute intracranial abnormality identified.  Electronically Signed: By: Lars Pinks M.D. On: 08/28/2014 09:34    Echo 9/11: EF 35-40%. No obvious vegetation on TTE  Carotid doppler 07/11/2014 Summary:  - The vertebral arteries appear patent with antegrade flow. - Findings consistent with 1- 39 percent stenosis involving the right internal carotid artery and the left internal carotid artery. - Suggestive of 50-99% stenosis of the right subclavian artery, possibly also more proximal into the innominate artery, based on turbulent flow. Left subclavian artery is within normal limits.   Etta Quill PA-C [facility] [phone]  08/28/2014, 10:18 AM  Patient seen and examined.  Clinical course and management  discussed.  Necessary edits performed.  I agree with the above.  Assessment and plan of care developed and discussed below.     Assessment: 80 y.o. male with acute onset of expressive aphasia, dysarthria,and right facial droop.  CT of the brain was reviewed and shows no acute changes but does show what appears to be multiple lacunar infarcts that are new since 8/2.   Expect a MCA branch occlusion.  Likely cardiac in origin.  Patient is not a tPA candidate due to PT>15 and PLT count of 25.  Patient was not a CTA candidate due to low NIHSS.     Stroke Risk Factors - atrial fibrillation, hyperlipidemia and hypertension  Recommendations: 1.  Continue Coumadin 2.  Continue telemetry monitoring  3.  Neuro checks q 2 hours 4.  Speech therapy   Alexis Goodell, MD [facility] [phone]  08/28/2014  2:15 PM

## 2014-08-28 NOTE — Code Documentation (Signed)
80yo male admitted to the hospital with endocarditis and underwent pacemaker extraction and replacement as well as aortic valve replacement.  Patient in the cardiac stepdown unit.  Staff assisted patient to the chair at 0700 this morning and he was reportedly at his baseline.  Patient's bedside nurse assessed patient at [phone] and found him to have tremors that became progressively worse along with difficulty forming his words.  When attempting to drink he was unable to hold the cup in his right hand.  RN notified Dr. Roxy Manns and Code Stroke was called at [place].  Stroke team to the bedside.  LKW 0700.  Patient transported to CT.  Patient's speech worsened with head elevation so head of bed placed flat.  Patient transported back to 2H28.  NIHSS 4, see documentation for details and code stroke times.  Dr.  Doy Mince to bedside to assess.  Patient is contraindicated for tPA d/t PLT of 25,000 today.  Patient is taking Coumadin, INR 1.27 today.  Patient continues to have expressive aphasia and dysarthria and mild facial droop.  Bedside handoff with bedside RN Fraser Din.

## 2014-08-28 NOTE — Progress Notes (Addendum)
      [address]       [place] [address]             [phone]      8 Days Post-Op Procedure(s) (LRB): INSERTION PLEURAL DRAINAGE CATHETER (Bilateral)  Subjective:  Patient with new onset dysarthria this morning.  Stroke page was initiated.  Objective: Vital signs in last 24 hours: Temp:  [97.8 F (36.6 C)-98.7 F (37.1 C)] 98.2 F (36.8 C) (09/25 1011) Pulse Rate:  [77-84] 84 (09/25 0300) Cardiac Rhythm:  [-] Ventricular paced (09/25 0830) Resp:  [18-31] 20 (09/25 1000) BP: (114-142)/(46-100) 114/58 mmHg (09/25 1000) SpO2:  [96 %-98 %] 98 % (09/25 0753) Weight:  [180 lb 5.4 oz (81.8 kg)] 180 lb 5.4 oz (81.8 kg) (09/25 0300)  Intake/Output from previous day: 09/24 0701 - 09/25 0700 In: 1100 [P.O.:600; I.V.:350; IV Piggyback:150] Out: 975 [Urine:975]  General appearance: alert, cooperative and no distress Heart: regular rate and rhythm Lungs: diminished breath sounds bibasilar Abdomen: soft, non-tender; bowel sounds normal; no masses,  no organomegaly Extremities: edema 1+ Wound: clean and dry  Lab Results:  Recent Labs  08/27/14 0357 08/28/14 0320  WBC 5.2 4.4  HGB 9.1* 9.1*  HCT 29.1* 28.4*  PLT 29* 25*   BMET:  Recent Labs  08/27/14 0357 08/28/14 0320  NA 138 140  K 4.2 3.8  CL 97 98  CO2 27 25  GLUCOSE 83 84  BUN 70* 68*  CREATININE 2.73* 2.69*  CALCIUM 8.2* 8.0*    PT/INR:  Recent Labs  08/28/14 0320  LABPROT 15.9*  INR 1.27   ABG    Component Value Date/Time   PHART 7.395 07/25/2014 2059   HCO3 19.5* 07/25/2014 2059   TCO2 20 07/25/2014 2059   ACIDBASEDEF 5.0* 07/25/2014 2059   O2SAT 66.3 08/10/2014 0435   CBG (last 3)   Recent Labs  08/27/14 1817 08/27/14 2135 08/28/14 0756  GLUCAP 135* 73 97    Assessment/Plan: S/P Procedure(s) (LRB): INSERTION PLEURAL DRAINAGE CATHETER (Bilateral)  1. CV- hemodynamically stable 2. INR 1.27, minimal response to 1 mg of Coumadin, dose increased yesterday will  monitor and increase as needed 3. Renal- creatinine remains stable, good diuresis, Nephrology following 4. Neuro- new onset dysarthria this morning, stroke page called, Neurology following 5. ITP- Dr. Arelia Sneddon managing, platelet count 25 this morning, continue to hold Heparin, Lovenox 6. ID- needs to complete six week of IV ABX per ID from date of surgery continue Ampicillin, Rocephin 7. Dispo- patient with new stroke this morning, continue IV ABX, continue current care  LOS: 53 days    Justin [name], Justin [name] 08/28/2014  I have seen and examined the patient and agree with the assessment and plan as outlined.  Given the concern for possible embolic stroke I feel that TEE should be performed at some point to look for signs of vegetations.  Although the patient would clearly NOT be a candidate for surgical intervention if vegetations are present, the duration of therapy with antibiotics would be changed.  Intravenous antibiotics to treat both his original Enterococcus and more recent fungal sepsis should be continued INDEFINITELY until this issue has been clarified.  Justin [name] 08/28/2014 1:21 PM

## 2014-08-28 NOTE — Progress Notes (Signed)
NUTRITION FOLLOW UP  INTERVENTION:   Continue Ensure Complete po BID, each supplement provides 350 kcal and 13 grams of protein   Provide Magic Cup TID with meals.    Will continue to monitor  NUTRITION DIAGNOSIS: Increased nutrient needs related to post-op healing as evidenced by estimated nutrition needs, ongoing  Goal: Pt to meet >/= 90% of their estimated nutrition needs, not meeting  Monitor:  PO & supplemental intake, weight, labs, I/O's  ASSESSMENT: 80 y.o. Male with history of CAD status post stents, HTN, dyslipidemia, atrial fibrillation no longer on anticoagulation secondary to recent GI bleed, pacemaker, ITP currently being treated with IVIG by Dr. Marin Olp (most recently 8/3) and prednisone 80mg  daily, who presented to the emergency department with complaints of fever and shortness of breath that started 8/2. In ED found to have leukocytosis, hypotension, and lactic acidosis.    Patient s/p procedures 8/21: AORTIC ROOT REPLACEMENT MITRAL VALVE REPLACEMENT CORONARY BYPASS GRAFTING MAZE PROCEDURE Pt started CVVHD 9/8 due to fluid overload. CVVHD stopped 9/14 am due to clotting and has not resumed. Pt's last HD on 9/16. Thoracentesis 9/21.   Per RN, pt did not receive Ensure supplement this AM d/t a code. RN states that he ate most of his breakfast. Stroke Swallow Screen performed this AM. SLP recommends Dys 3  Diet with thin liquids. Pt needs assistance with eating. Appetite is adequate.  Per pt's family, pt was not receiving magic cups on his tray. Will check to see if order is placed and will send TID. Pt is still drinking Ensure BID when provided.  Labs reviewed: High BUN & Creatinine   Height: Ht Readings from Last 1 Encounters:  08/26/14 5\' 11"  (1.803 m)    Weight: Wt Readings from Last 1 Encounters:  08/28/14 180 lb 5.4 oz (81.8 kg)  Weight has been variable due to fluid status  BMI:  Body mass index is 25.16 kg/(m^2).  Estimated Nutritional  Needs: Kcal: 2000-2300 Protein: 115-130 gm Fluid: 1.2 L  Skin: chest surgical incision, +1 generalized edema  Diet Order: Dys 3, thin liquids, fluid restriction Meal Completion: 40%   Intake/Output Summary (Last 24 hours) at 08/28/14 0908 Last data filed at 08/28/14 0500  Gross per 24 hour  Intake   1100 ml  Output    975 ml  Net    125 ml   BM: 9/23  Labs:   Recent Labs Lab 08/26/14 0650 08/27/14 0357 08/28/14 0320  NA 137 138 140  K 4.3 4.2 3.8  CL 99 97 98  CO2 26 27 25   BUN 64* 70* 68*  CREATININE 2.55* 2.73* 2.69*  CALCIUM 8.3* 8.2* 8.0*  PHOS 4.2 4.8* 4.7*  GLUCOSE 86 83 84    CBG (last 3)   Recent Labs  08/27/14 1817 08/27/14 2135 08/28/14 0756  GLUCAP 135* 73 97    Scheduled Meds: . ampicillin (OMNIPEN) IV  2 g Intravenous 3 times per day  . antiseptic oral rinse  7 mL Mouth Rinse BID  . aspirin EC  81 mg Oral Daily  . atorvastatin  10 mg Oral QPM  . cefTRIAXone (ROCEPHIN)  IV  2 g Intravenous Q12H  . darbepoetin (ARANESP) injection - NON-DIALYSIS  100 mcg Subcutaneous Q Fri-1800  . feeding supplement (ENSURE COMPLETE)  237 mL Oral BID BM  . fluconazole  200 mg Oral Daily  . folic acid  2 mg Oral Daily  . furosemide  120 mg Oral BID  . IMMUNE GLOBULIN 10% (HUMAN) IV -  For Fluid Restriction Only  400 mg/kg Intravenous Q24H  . insulin aspart  0-15 Units Subcutaneous TID WC  . insulin aspart  0-5 Units Subcutaneous QHS  . pantoprazole  40 mg Oral Daily  . pyridOXINE  100 mg Oral QPM  . vitamin B-12  1,000 mcg Oral QPM  . vitamin E  400 Units Oral Daily  . warfarin  2 mg Oral q1800  . Warfarin - Physician Dosing Inpatient   Does not apply q1800    Continuous Infusions:   Clayton Bibles, MS, RD, [facility] Provisionally Licensed Dietitian Nutritionist Pager: [phone]

## 2014-08-28 NOTE — Progress Notes (Signed)
PT Cancellation Note  Patient Details Name: Justin [name] MRN: [id number] DOB: 25-May-1933   Cancelled Treatment:    Reason Eval/Treat Not Completed: Medical issues which prohibited therapy. Noted events of this morning (new onset stroke-like symptoms) with Stroke team consult pending. Noted in RN note that pt's symptoms worsened with HOB elevated and returned to [facility] flat. Will defer PT today and resume 9/26 if appropriate.   Mikail Goostree 08/28/2014, 10:35 AM Pager [phone]

## 2014-08-28 NOTE — Progress Notes (Signed)
Stroke Swallow Screen performed on patient at 1011.  Bedside RN at bedside.  Patient coughing with sips of water from the cup.  Bedside RN reports that this is a change from this morning as he had no difficulties while she assisted him with breakfast.  SSS failed d/t coughing.  Bedside RN to inform patient's physician of results.  Order placed for Speech Therapy swallow evaluation per protocol.

## 2014-08-28 NOTE — Progress Notes (Signed)
Etta Quill PA notified of pt inability to express self clearly.  Speech garbled, not understandable.  No orders received at present.  Will continue to monitor pt closely.

## 2014-08-29 LAB — CBC
HEMATOCRIT: 29 % — AB (ref 39.0–52.0)
Hemoglobin: 9.2 g/dL — ABNORMAL LOW (ref 13.0–17.0)
MCH: 31.6 pg (ref 26.0–34.0)
MCHC: 31.7 g/dL (ref 30.0–36.0)
MCV: 99.7 fL (ref 78.0–100.0)
Platelets: 21 10*3/uL — CL (ref 150–400)
RBC: 2.91 MIL/uL — ABNORMAL LOW (ref 4.22–5.81)
RDW: 22 % — ABNORMAL HIGH (ref 11.5–15.5)
WBC: 4.5 10*3/uL (ref 4.0–10.5)

## 2014-08-29 LAB — RENAL FUNCTION PANEL
Albumin: 1.8 g/dL — ABNORMAL LOW (ref 3.5–5.2)
Anion gap: 15 (ref 5–15)
BUN: 68 mg/dL — AB (ref 6–23)
CHLORIDE: 97 meq/L (ref 96–112)
CO2: 26 mEq/L (ref 19–32)
Calcium: 8.2 mg/dL — ABNORMAL LOW (ref 8.4–10.5)
Creatinine, Ser: 2.72 mg/dL — ABNORMAL HIGH (ref 0.50–1.35)
GFR calc Af Amer: 24 mL/min — ABNORMAL LOW (ref >90)
GFR calc non Af Amer: 21 mL/min — ABNORMAL LOW (ref >90)
Glucose, Bld: 101 mg/dL — ABNORMAL HIGH (ref 70–99)
PHOSPHORUS: 4.8 mg/dL — AB (ref 2.3–4.6)
Potassium: 3.5 mEq/L — ABNORMAL LOW (ref 3.7–5.3)
Sodium: 138 mEq/L (ref 137–147)

## 2014-08-29 LAB — PROTIME-INR
INR: 1.29 (ref 0.00–1.49)
PROTHROMBIN TIME: 16.1 s — AB (ref 11.6–15.2)

## 2014-08-29 LAB — RETICULOCYTES
RBC.: 2.91 MIL/uL — AB (ref 4.22–5.81)
RETIC COUNT ABSOLUTE: 209.5 10*3/uL — AB (ref 19.0–186.0)
RETIC CT PCT: 7.2 % — AB (ref 0.4–3.1)

## 2014-08-29 LAB — GLUCOSE, CAPILLARY
GLUCOSE-CAPILLARY: 95 mg/dL (ref 70–99)
Glucose-Capillary: 110 mg/dL — ABNORMAL HIGH (ref 70–99)
Glucose-Capillary: 105 mg/dL — ABNORMAL HIGH (ref 70–99)
Glucose-Capillary: 118 mg/dL — ABNORMAL HIGH (ref 70–99)

## 2014-08-29 MED ORDER — SALINE SPRAY 0.65 % NA SOLN
1.0000 | NASAL | Status: DC | PRN
  Administered 2014-08-29: 1 via NASAL
  Filled 2014-08-29 (×2): qty 44

## 2014-08-29 NOTE — Evaluation (Signed)
Speech Language Pathology Evaluation Patient Details Name: Justin [name] MRN: [id number] DOB: Jul 22, 1933 Today's Date: 08/29/2014 Time: 3267-1245 SLP Time Calculation (min): 40 min  Problem List:  Patient Active Problem List   Diagnosis Date Noted  . Acute on chronic diastolic ACC/AHA stage C congestive heart failure 08/22/2014  . Pleural effusions, bilateral, recurrent   . Fungemia 08/14/2014  . Acute renal failure superimposed on stage 3 chronic kidney disease 07/27/2014  . Acute on chronic diastolic heart failure 80/99/8338  . S/P aortic valve replacement with stentless valve 07/16/2014  . S/P mitral valve replacement with bioprosthetic valve 07/12/2014  . S/P CABG x 1 07/17/2014  . S/P Maze operation for atrial fibrillation 08/01/2014  . Aortic insufficiency   . Chronic kidney disease   . Bacterial endocarditis - Enterococcus sepsis with aortic valve vegetation   . TIA (transient ischemic attack) 07/10/2014  . Enterococcal bacteremia 07/08/2014  . Sepsis 07/09/2014  . Iron deficiency anemia, unspecified 07/02/2014  . Malabsorption of iron 07/02/2014  . Hypotestosteronemia 07/02/2014  . Diastolic congestive heart failure 06/03/2014  . CHF (congestive heart failure) 05/24/2014  . Acute bronchitis 05/18/2014  . SOB (shortness of breath) 05/18/2014  . Hearing loss 12/21/2013  . Hyperglycemia 12/21/2013  . Mitral regurgitation 08/13/2013  . Chronotropic incompetence with sinus node dysfunction 07/22/2013  . Thrombocytopenia 06/03/2013  . Obstructive sleep apnea 06/03/2013  . BPH (benign prostatic hyperplasia) 06/03/2013  . Hyperlipidemia   . Anemia   . Atrial fibrillation   . Near syncope 03/05/2013  . Cerebrovascular disease 08/13/2012  . CAD (coronary artery disease)   . HTN (hypertension)    Past Medical History:  Past Medical History  Diagnosis Date  . CAD (coronary artery disease) prior stenting 1999   . Dyslipidemia   . HTN (hypertension)   . WPW  (Wolff-Parkinson-White syndrome) loss of preexcitation   . Atrial fibrillation   . AV block, Mobitz II   . Presyncope   . Myocardial infarction   . Pacemaker 04/07/2013  . Arthritis   . History of chicken pox   . Anemia   . Hypernatremia 06/03/2013  . Thrombocytopenia, unspecified 06/03/2013  . Leukopenia 06/03/2013  . Obstructive sleep apnea 06/03/2013    USES CPAP  . Urinary incontinence 06/03/2013  . Hyperglycemia 12/21/2013  . Pancytopenia 06/03/2013  . Iron deficiency anemia, unspecified 07/02/2014  . Malabsorption of iron 07/02/2014  . Hypotestosteronemia 07/02/2014  . ITP (idiopathic thrombocytopenic purpura) 07/02/2014    possible - although patient had bacterial endocarditis at the time of diagnosis  . Aortic insufficiency   . Chronic kidney disease   . Bacterial endocarditis      Enterococcus sepsis with aortic valve vegetation  . UTI (urinary tract infection) 07/08/2014    ENTEROBACTER CLOACAE   . S/P aortic valve and mitral valve replacement 07/23/2014    21 mm Medtronic Freestyle porcine aortic root graft 29 mm [facility] Mitral bovine bioprosthetic mitral valve   . S/P aortic valve replacement with stentless valve 07/12/2014    21 mm Medtronic Freestyle porcine aortic root graft with reimplantation of left main and right coronary arteries  . S/P mitral valve replacement with bioprosthetic valve 07/11/2014    29 mm [facility] Mitral bovine bioprosthetic tissue valve  . S/P CABG x 1 07/29/2014    SVG to OM1 with EVH via right thigh  . S/P Maze operation for atrial fibrillation 07/26/2014    Complete bilateral atrial lesion set using cryothermy and bipolar radiofrequency ablation with clipping of  LA appendage  . Acute renal failure superimposed on stage 3 chronic kidney disease 07/27/2014  . Acute on chronic diastolic heart failure 02/16/1760  . Pleural effusions, bilateral, recurrent    Past Surgical History:  Past Surgical History  Procedure Laterality Date  . Coronary  angioplasty with stent placement    . Vastectomy    . Insert / replace / remove pacemaker  04/07/2013  . Tee without cardioversion N/A 08/01/2014    Procedure: TRANSESOPHAGEAL ECHOCARDIOGRAM (TEE);  Surgeon: Lelon Perla, MD;  Location: [facility];  Service: Cardiovascular;  Laterality: N/A;  . Aortic valve replacement N/A 07/04/2014    Procedure: ROOT REPLACEMENT WITH BIOPROSTHETIC PORCINE AORTIC ROOT REIMPLANTATION OF LEFT MAIN AND RIGHT CORONARY ARTERIES;  Surgeon: Rexene Alberts, MD;  Location: [facility];  Service: Open Heart Surgery;  Laterality: N/A;  . Intraoperative transesophageal echocardiogram N/A 07/26/2014    Procedure: INTRAOPERATIVE TRANSESOPHAGEAL ECHOCARDIOGRAM;  Surgeon: Rexene Alberts, MD;  Location: [facility];  Service: Open Heart Surgery;  Laterality: N/A;  . Maze N/A 07/17/2014    Procedure: MAZE;  Surgeon: Rexene Alberts, MD;  Location: [facility];  Service: Open Heart Surgery;  Laterality: N/A;  . Coronary artery bypass graft N/A 07/12/2014    Procedure: CORONARY ARTERY BYPASS GRAFTING (CABG), on pump, times one, using right greater saphenous vein harvested endoscopically.;  Surgeon: Rexene Alberts, MD;  Location: [facility];  Service: Open Heart Surgery;  Laterality: N/A;  . Mitral valve replacement N/A 07/30/2014    Procedure: MITRAL VALVE (MV) REPLACEMENT;  Surgeon: Rexene Alberts, MD;  Location: [facility];  Service: Open Heart Surgery;  Laterality: N/A;  . Chest tube insertion Bilateral 08/07/2014    Procedure: INSERTION PLEURAL DRAINAGE CATHETER;  Surgeon: Rexene Alberts, MD;  Location: [facility];  Service: Thoracic;  Laterality: Bilateral;  bilateral Pleur-X catheter placement   HPI:  Pt is a 80 y.o. male with history of CAD status post stents, hypertension, dyslipidemia, a-fib, pacemaker, ITP currently being treated with IVIG, who presents to the emergency department with complaints of fever and shortness of breath. Pt found to have bacterial endocarditis and underwent MVR, Aortic root  replacement, and CABG on 07/20/2014. Pt underwent pacer placement on 8/28. Pt was found with new onset of slurred speech, difficulty expressing himself, and weakness that began in the morning of 9/25. Prior to this event patient was tolerating regular textures and thin liquids, however did not pass the RN stroke swallow screen due to coughing with thin liquids. Per protocol, SLP bedside swallow evaluation was ordered.   Assessment / Plan / Recommendation Clinical Impression  Pt demonstrates adequate auditory comprehension but significantly impaired speech intelligibility, presenting as ataxic dysarthria or possible motor planning disorder. Pt is able to name objects and formulate language at phrase level, repeat phrases though speech is characterized by long hesitations and imprecise, inconsistent misarticulations, possibly groping behavior, increasing with stress and time pressure. Pt is able to self correct at phrase level with a patient listener and extra time. Muscle weakness of lips, tongue, face on the right also impair speech intelligibility.  There are also overt visual deficits that need to be explored further.  Pt is motivated to participate in therapy and would benefit from ongoing SLP therapy at acute level and f/u with CIR.     SLP Assessment  Patient needs continued Speech Lanaguage Pathology Services    Follow Up Recommendations  Inpatient Rehab    Frequency and Duration min 2x/week  2 weeks  Pertinent Vitals/Pain Pain Assessment: No/denies pain   SLP Goals  Progression toward goals: Progressing toward goals Patient/Family Stated Goal: none stated Potential to Achieve Goals: Good  SLP Evaluation Prior Functioning  Cognitive/Linguistic Baseline: Information not available Type of Home: House  Lives With: Spouse Available Help at Discharge: Family;Available 24 hours/day   Cognition  Overall Cognitive Status: Within Functional Limits for tasks assessed Arousal/Alertness:  Awake/alert Orientation Level: Oriented X4 Attention: Alternating Alternating Attention: Appears intact Memory: Appears intact Awareness: Impaired Awareness Impairment: Emergent impairment (due to visual and physical deficits) Problem Solving: Impaired Problem Solving Impairment: Functional complex;Functional basic (developing with assist) Safety/Judgment: Appears intact    Comprehension  Auditory Comprehension Overall Auditory Comprehension: Appears within functional limits for tasks assessed Reading Comprehension Reading Status: Impaired Interfering Components: Visual scanning;Visual acuity;Visual perceptual    Expression Verbal Expression Overall Verbal Expression: Impaired Initiation: Impaired Automatic Speech: Name;Social Response;Counting Level of Generative/Spontaneous Verbalization: Phrase Repetition: Impaired Level of Impairment: Phrase level Naming: Impairment Responsive: Not tested Confrontation: Impaired Verbal Errors: Phonemic paraphasias;Aware of errors Pragmatics: No impairment Interfering Components: Speech intelligibility Effective Techniques: Phonemic cues;Articulatory cues Written Expression Dominant Hand: Right   Oral / Motor Oral Motor/Sensory Function Overall Oral Motor/Sensory Function: Impaired Labial ROM: Reduced right Labial Symmetry: Abnormal symmetry right Labial Strength: Reduced Labial Sensation: Reduced Lingual ROM: Within Functional Limits Lingual Symmetry: Within Functional Limits Lingual Strength: Reduced Lingual Sensation: Reduced Facial ROM: Reduced right Facial Symmetry: Right droop;Right drooping eyelid Facial Strength: Reduced Facial Sensation: Reduced Velum: Within Functional Limits Mandible: Within Functional Limits Motor Speech Overall Motor Speech: Impaired Respiration: Impaired Level of Impairment: Phrase Phonation: Low vocal intensity Resonance: Within functional limits Articulation: Impaired Level of Impairment:  Word Intelligibility: Intelligibility reduced Word: 50-74% accurate Phrase: 50-74% accurate Conversation: 25-49% accurate Motor Planning: Impaired Level of Impairment: Word Motor Speech Errors: Aware;Inconsistent Interfering Components: Hearing loss   GO    Herbie Baltimore, MA CCC-SLP [phone]  Lynann Beaver 08/29/2014, 8:58 AM

## 2014-08-29 NOTE — Progress Notes (Signed)
Admit: 08/02/2014 LOS: 59  31M w/ AoCKD 2/2 ATN (BL SCr 1.5) in setting of enterococcal IE, candidemia, s/p: AVR, MVR, CABG, MAZE on CRRT 9/8-9/14/15, last HD 08/19/14  Subjective:  Likey CVA yesterday, dysarthria, CT w/o hemorrhage. Stable GFR Back on IV lasix 80 BID Matching Is and Os ESA stopped On IVIG, s/p Nplate for TCP  25/49 0701 - 09/26 0700 In: 990 [P.O.:640; IV Piggyback:350] Out: 1525 [Urine:850; Chest Tube:675]  Filed Weights   08/27/14 0300 08/28/14 0300 08/29/14 0315  Weight: 81.6 kg (179 lb 14.3 oz) 81.8 kg (180 lb 5.4 oz) 81.8 kg (180 lb 5.4 oz)    Scheduled Meds: . ampicillin (OMNIPEN) IV  2 g Intravenous 3 times per day  . antiseptic oral rinse  7 mL Mouth Rinse BID  . aspirin EC  81 mg Oral Daily  . atorvastatin  10 mg Oral QPM  . cefTRIAXone (ROCEPHIN)  IV  2 g Intravenous Q12H  . feeding supplement (ENSURE COMPLETE)  237 mL Oral BID BM  . fluconazole (DIFLUCAN) IV  200 mg Intravenous Q24H  . folic acid  2 mg Intravenous Daily  . furosemide  80 mg Intravenous BID  . IMMUNE GLOBULIN 10% (HUMAN) IV - For Fluid Restriction Only  400 mg/kg Intravenous Q24H  . insulin aspart  0-15 Units Subcutaneous TID WC  . insulin aspart  0-5 Units Subcutaneous QHS  . pantoprazole (PROTONIX) IV  40 mg Intravenous Q24H  . pyridOXINE  100 mg Oral QPM  . vitamin B-12  1,000 mcg Oral QPM  . vitamin E  400 Units Oral Daily  . warfarin  2 mg Oral q1800  . Warfarin - Physician Dosing Inpatient   Does not apply q1800   Continuous Infusions:  PRN Meds:.acetaminophen, docusate sodium, levalbuterol, ondansetron (ZOFRAN) IV, ondansetron (ZOFRAN) IV, ondansetron, promethazine, sodium chloride, traMADol, zolpidem   Physical Exam:  Blood pressure 138/57, pulse 84, temperature 99.5 F (37.5 C), temperature source Oral, resp. rate 22, height 5\' 11"  (1.803 m), weight 81.8 kg (180 lb 5.4 oz), SpO2 94.00%. NAD, in bed, confused Nl S1S2 CTAB Ant  auscultation S/nt/nd 1+LEE  Assessment 1. AoCKD (BL SCr 1.5) 2/2 ATN, off HD since 08/19/14; Stable UOP on IV lasix with Stable GFR w/ good electrolyte control.  No changes 2. Anemia, multifactorial, off ESA 2/2 acute CVA 08/28/14 3. S/p AVR, MVR, CABG, MAZE: per TCTS and Cardiology 4. Enterococcal IE and Fungemia: RCID following; on ampicillin/ceftriaxone 5. Pleural effusiosn w/ pleurex catheter 6. TCP, hematology following, started IVIG 08/27/14; s/p NPlate 7. 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 Stable GFR, might be his new or at least current baseline   2. No change to diuretics; volume status, weight, and BP ok  Pearson Grippe MD 08/29/2014, 6:50 AM   Recent Labs Lab 08/27/14 0357 08/28/14 0320 08/29/14 0415  NA 138 140 138  K 4.2 3.8 3.5*  CL 97 98 97  CO2 27 25 26   GLUCOSE 83 84 101*  BUN 70* 68* 68*  CREATININE 2.73* 2.69* 2.72*  CALCIUM 8.2* 8.0* 8.2*  PHOS 4.8* 4.7* 4.8*    Recent Labs Lab 08/27/14 0357 08/28/14 0320 08/29/14 0415  WBC 5.2 4.4 4.5  HGB 9.1* 9.1* 9.2*  HCT 29.1* 28.4* 29.0*  MCV 100.0 99.6 99.7  PLT 29* 25* 21*

## 2014-08-29 NOTE — Progress Notes (Signed)
CARDIAC REHAB PHASE I  Assessed pt appropriateness for ambulation.  Per pt primary RN, pt is unable to ambulate or be transferred to recliner this morning and to follow up with patient on Monday.   Lillia Dallas MS, ACSM RCEP 9:03 AM 08/29/2014

## 2014-08-29 NOTE — Progress Notes (Signed)
Patient experiencing intermittent drops in oxygen saturation.  Has maintained saturations of 91-92, but now is dropping into the high 80s.  Oxygen via nasal cannula started.  Will continue to monitor pt closely.

## 2014-08-29 NOTE — Progress Notes (Signed)
STROKE TEAM PROGRESS NOTE   HISTORY  Justin [name] is an 80 y.o. male admitted to hospital on 07/12/2014 for heart failure and hypotension. He is currently being followed by Dr. Marin Olp for ITP and treated with systemic steroids and was treated with IVIG. While in hospital he was noted to have both enterococcus and Enterobacter in his urine and blood. 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 On 07/18/2014 he underwent a Aortic root replacement, MV replacement, CABG and Maze procedure. While hospitalized he developed ATN and required hemodialysis. His kidney function improved and has been of HD since 08-19-2014. Patient was improving slowly. This AM night nurse check on him and placed him I the chair at 0700 hours. At that time he was his baseline. Morning nurse came to check on him and noted he was having difficulty expressing himself and on exam she noted new onset of right arm weakness. Code stroke was called. Patient was brought to CT where no acute infarct was found but did show bilateral cerebellar lacunar infarcts increased in number since August. Patient was not a tPA candidate due to PT >15 and PLT count of 25.   Date last known well: Date: 08/28/2014  Time last known well: Time: 07:00  tPA Given: No: PLT 25, PT >15.9      SUBJECTIVE (INTERVAL HISTORY) Daughter in-law at bedside. Dr Merlene Laughter  Discussed case w family    OBJECTIVE Temp:  [98.2 F (36.8 C)-99.5 F (37.5 C)] 98.8 F (37.1 C) (09/26 0820) Pulse Rate:  [78-88] 86 (09/26 0820) Cardiac Rhythm:  [-] Ventricular paced (09/25 2000) Resp:  [14-51] 31 (09/26 0820) BP: (114-151)/(49-100) 114/67 mmHg (09/26 0820) SpO2:  [90 %-100 %] 94 % (09/26 0820) Weight:  [180 lb 5.4 oz (81.8 kg)] 180 lb 5.4 oz (81.8 kg) (09/26 0315)   Recent  Labs Lab 08/28/14 0756 08/28/14 1152 08/28/14 1542 08/28/14 2131 08/29/14 0824  GLUCAP 97 167* 163* 111* 95    Recent Labs Lab 08/25/14 0527 08/26/14 0650 08/27/14 0357 08/28/14 0320 08/29/14 0415  NA 133* 137 138 140 138  K 4.7 4.3 4.2 3.8 3.5*  CL 97 99 97 98 97  CO2 23 26 27 25 26   GLUCOSE 171* 86 83 84 101*  BUN 64* 64* 70* 68* 68*  CREATININE 2.65* 2.55* 2.73* 2.69* 2.72*  CALCIUM 8.1* 8.3* 8.2* 8.0* 8.2*  PHOS 4.0 4.2 4.8* 4.7* 4.8*    Recent Labs Lab 08/25/14 0527 08/26/14 0650 08/27/14 0357 08/28/14 0320 08/29/14 0415  ALBUMIN 1.9* 1.9* 1.9* 1.8* 1.8*    Recent Labs Lab 08/25/14 0527 08/26/14 0650 08/27/14 0357 08/28/14 0320 08/29/14 0415  WBC 7.1 5.4 5.2 4.4 4.5  HGB 9.5* 9.5* 9.1* 9.1* 9.2*  HCT 30.5* 30.1* 29.1* 28.4* 29.0*  MCV 101.0* 101.7* 100.0 99.6 99.7  PLT 51* 35* 29* 25* 21*   No results found for this basename: CKTOTAL, CKMB, CKMBINDEX, TROPONINI,  in the last 168 hours  Recent Labs  08/27/14 0357 08/28/14 0320 08/29/14 0415  LABPROT 15.6* 15.9* 16.1*  INR 1.24 1.27 1.29    Recent Labs  08/26/14 1408  COLORURINE YELLOW  LABSPEC 1.012  PHURINE 5.0  GLUCOSEU NEGATIVE  HGBUR NEGATIVE  BILIRUBINUR NEGATIVE  KETONESUR NEGATIVE  PROTEINUR NEGATIVE  UROBILINOGEN 0.2  NITRITE NEGATIVE  LEUKOCYTESUR NEGATIVE       Component Value Date/Time  CHOL 125 08/12/2014 1600   TRIG 66 01/13/2014 0849   HDL 49 01/13/2014 0849   CHOLHDL 2.6 01/13/2014 0849   VLDL 13 01/13/2014 0849   LDLCALC 64 01/13/2014 0849   Lab Results  Component Value Date   HGBA1C 5.4 07/21/2014   No results found for this basename: labopia, cocainscrnur, labbenz, amphetmu, thcu, labbarb    No results found for this basename: ETH,  in the last 168 hours  Ct Head Wo Contrast 08/28/2014 1. Bilateral cerebellar lacunar infarcts, appear increased in number since August.  2. No other acute intracranial abnormality identified.      PHYSICAL EXAM GENERAL: No  distress. Appears globally weak  HEENT: normal  ABDOMEN: soft  EXTREMITIES: No edema   SKIN: Normal by inspection.    MENTAL STATUS: Alert. Marked dysarthria. Follows commands.    CRANIAL NERVES: Pupils are equal, round and reactive to light and accomodation; extra ocular movements are full, there is no significant nystagmus; dense L homonomous hemianopia;  there is flattening of the nasolabial fold-R; tongue is midline.  MOTOR: Atrophy -R FDI both arms 4-/5; legs 5/5  COORDINATION: Left finger to nose is normal, right finger to nose is normal, No rest tremor; no intention tremor; no postural tremor; no bradykinesia.    ASSESSMENT/PLAN  Justin [name] is a 80 y.o. male with history of coronary artery disease, dyslipidemia, hypertension, atrial fibrillation, permanent pacemaker, and obstructive sleep apnea presenting with right upper extremity weakness.Marland Kitchen He did not receive TPA secondary to thrombocytopenia.  Imaging confirms bilateral cerebellar lacunar infarcts, appear increased in number since August felt to be embolic.  Stroke - bilateral cerebellar lacunar infarcts believed to be embolic secondary to atrial fibrillation  R large vessel hemispheric infarct with dense L homonomous hemianopia -- due to Afib   MRI  not ordered secondary to permanent pacemaker  MRA  not ordered secondary to permanent pacemaker Carotid Doppler - 07/11/2014 - The vertebral arteries appear patent with antegrade flow.- Findings consistent with 1- 39 percent stenosis involving the right internal carotid artery and the left internal carotid artery. Suggestive of 50-99% stenosis of the right subclavian artery, possibly also more proximal into the innominate artery, based on turbulent flow. Left subclavian artery is within normal limits.                                 2D Echo - 08/14/2014 - ejection fraction 35-40%    no antithrombotics prior to admission, now on warfarin... Increase risk due  thrombocytopenia but w multiple strokes already.    LDL 64 - on Lipitor prior to admission .  HgbA1c 5.4 WNL  SCDs / Warfarin for VTE prophylaxis  Dysphagia 3 with thin liquids.   Out of bed   Resultant dysarthria and L field CUT  Therapy recommendations: - CIR  Ongoing aggressive risk factor management  Risk factor education  Patient counseled to be compliant with his antithrombotic medications  Disposition:  Pending  Hypertension   Permissive hypertension <220/120 for 24-48 hours and then gradually normalize within 5-7 days  BP goal long term normotensive  BP (114-151)/(49-100) 114/67 mmHg (09/26 0820) past 24h (08/29/2014 @ 8:32 AM)  Stable  Patient counseled to be compliant with his blood pressure medications  Hyperlipidemia  Home meds:  Lipitor - resumed in hospital.  LDL 64, goal < 100 (<70 for diabetics)  Continue statin at discharge  Diabetes  HgbA1c 5.4, Goal <  7.0  Controlled  Other Stroke Risk Factors Advanced age ETOH use    Family hx stroke - mother and brother   Coronary artery disease  Other Active Problems  Anemia  Renal insufficiency  Other Pertinent History  ITP - (idiopathic thrombocytopenic purpura) Dr Marin Olp - Plts 21K  WPW  On 07/25/2014 he underwent a Aortic root replacement, MV replacement, CABG and Maze procedure.  Hospital day # [address] PA-C [facility] Pager [phone] 08/29/2014, 8:32 AM     To contact Stroke Continuity provider, please refer to [email or web address] After hours, contact General Neurology

## 2014-08-29 NOTE — Progress Notes (Addendum)
      [address]       [place] [address]             [phone]    36 Days Post-Op Procedure(s) (LRB):  ROOT REPLACEMENT WITH BIOPROSTHETIC PORCINE AORTIC ROOT REIMPLANTATION OF LEFT MAIN AND RIGHT CORONARY ARTERIES   INTRAOPERATIVE TRANSESOPHAGEAL ECHOCARDIOGRAM   MAZE   CORONARY ARTERY BYPASS GRAFTING (CABG), on pump, times one, using right greater saphenous vein harvested endoscopically.  MITRAL VALVE (MV) REPLACEMENT  29 Days Post-Op  Procedure(s) (LRB):  PERMANENT PACEMAKER INSERTION    9 Days Post-Op Procedure(s) (LRB): INSERTION PLEURAL DRAINAGE CATHETER (Bilateral)  Subjective: Speech garbled but more alert this am.  Objective: Vital signs in last 24 hours: Temp:  [98.2 F (36.8 C)-99.5 F (37.5 C)] 98.8 F (37.1 C) (09/26 0820) Pulse Rate:  [78-88] 86 (09/26 0820) Cardiac Rhythm:  [-] Ventricular paced (09/25 2000) Resp:  [14-51] 31 (09/26 0820) BP: (114-151)/(53-89) 114/67 mmHg (09/26 0820) SpO2:  [90 %-100 %] 94 % (09/26 0820) Weight:  [180 lb 5.4 oz (81.8 kg)] 180 lb 5.4 oz (81.8 kg) (09/26 0315)   Current Weight  08/29/14 180 lb 5.4 oz (81.8 kg)       Intake/Output from previous day: 09/25 0701 - 09/26 0700 In: 22 [P.O.:640; IV Piggyback:350] Out: 1525 [Urine:850; Chest Tube:675]   Physical Exam:  Cardiovascular: Paced Pulmonary: Slightly diminished at bases; no rales, wheezes, or rhonchi. Abdomen: Soft, non tender, bowel sounds present. Extremities: SCDs in place Wounds: Clean and dry.  No erythema or signs of infection.  Lab Results: CBC: Recent Labs  08/28/14 0320 08/29/14 0415  WBC 4.4 4.5  HGB 9.1* 9.2*  HCT 28.4* 29.0*  PLT 25* 21*   BMET:  Recent Labs  08/28/14 0320 08/29/14 0415  NA 140 138  K 3.8 3.5*  CL 98 97  CO2 25 26  GLUCOSE 84 101*  BUN 68* 68*  CREATININE 2.69* 2.72*  CALCIUM 8.0* 8.2*    PT/INR:  Lab Results  Component Value Date   INR 1.29 08/29/2014   INR 1.27 08/28/2014   INR  1.24 08/27/2014   ABG:  INR: Will add last result for INR, ABG once components are confirmed Will add last 4 CBG results once components are confirmed  Assessment/Plan:  1. CV - Paced. On ecasa 81 daily and Coumadin. INR 1.41. 2.  Pulmonary - s/p bilateral Pleur X catheters.  3. Volume Overload - on Lasix 80 IV daily 4.  Anemia-H and H stable at 9.6 and 31. Etiology multifactorial.  Was on Aranesp but stopped per Dr. Marin Olp 5. Neuro-had new dysarthria yesterday am. Likely CVA (embolic). CT head showed bilateral cerebellar lunar infarcts (appear increased in number since August), no acute hemorrhage. Will need TEE eventually. 6.AoCKD (baseline creatinine 1.5), ATN. Creatinine this am 2.72. Has not had HD since 9/16. Per nephrology 7.Thrombocytopenia-Platelets  24,000. On IVIG, s/p Nplate. Per hematology 8.ID-Enterococcus endocarditis. On Ampicillin and Rocephin. 9. CBGs 105/118/103. Pre op HGA1C 5.1. On dysphagia 3 diet. SS PRN.  ZIMMERMAN,DONIELLE MPA-C 08/29/2014,10:12 AM     Chart reviewed, patient examined, agree with above.

## 2014-08-29 NOTE — Progress Notes (Signed)
Patient ID: Justin [name], male   DOB: 04/16/1933, 80 y.o.   MRN: [id number]  I had a discussion with the patient's wife and children concerning his status. They said that he has a living will and would not want to be resuscitated if he should suffer cardiac arrest and would not want to be re-intubated. They had that discussion with him again this am and would like to make him a DNR. They would like to continue current treatment otherwise. I will write the order.

## 2014-08-29 NOTE — Progress Notes (Signed)
Dr. Cyndia Bent notified of family (wife's) request to change code status to DNR.

## 2014-08-29 NOTE — Progress Notes (Signed)
Subjective:  Events reviewed. Currently not SOB, no pain.  Dysarthria continues  Objective:  Vital Signs in the last 24 hours: BP 147/66  Pulse 88  Temp(Src) 98.3 F (36.8 C) (Oral)  Resp 30  Ht 5\' 11"  (1.803 m)  Wt 81.8 kg (180 lb 5.4 oz)  BMI 25.16 kg/m2  SpO2 92%  Physical Exam: Elderly WM dysarthric. Lungs:  Clear  Cardiac:  Regular rhythm, normal S1 and S2, no S3, 1/6 systolic murmur Extremities:  1+ edema present Neuro:  Able to raise arms, moderate dysarthria  Intake/Output from previous day: 09/25 0701 - 09/26 0700 In: 990 [P.O.:640; IV Piggyback:350] Out: 1525 [Urine:850; Chest Tube:675] Weight Filed Weights   08/27/14 0300 08/28/14 0300 08/29/14 0315  Weight: 81.6 kg (179 lb 14.3 oz) 81.8 kg (180 lb 5.4 oz) 81.8 kg (180 lb 5.4 oz)    Lab Results: Basic Metabolic Panel:  Recent Labs  08/28/14 0320 08/29/14 0415  NA 140 138  K 3.8 3.5*  CL 98 97  CO2 25 26  GLUCOSE 84 101*  BUN 68* 68*  CREATININE 2.69* 2.72*    CBC:  Recent Labs  08/28/14 0320 08/29/14 0415  WBC 4.4 4.5  HGB 9.1* 9.2*  HCT 28.4* 29.0*  MCV 99.6 99.7  PLT 25* 21*    BNP    Component Value Date/Time   PROBNP 8213.0* 08/05/2014 0445    PROTIME: Lab Results  Component Value Date   INR 1.29 08/29/2014   INR 1.27 08/28/2014   INR 1.24 08/27/2014    Telemetry:  Paced rhythm  Assessment/Plan:  1. Acute CVA yesterday with dysarthria, not candidate for TEE  Likely embolic 2. Acute on chronic kidney failure-nephrology following.  3. Thrombocytopenia- ITP. Hematology following.  4. Paroxysmal atrial fibrillation-Sinus this AM  5. CAD-continue aspirin and statin. Had SVG-LCx system with recent cardiac surgery.  6. Status post pacemaker for CHB  7. Enterococcal endocarditis  8. Acute diastolic CHF with prominent RV failure. Appears to be improving 9.  Status post aortic root replacement, bioprosthetic aortic valve replacement, bioprosthetic mitral valve replacement,  coronary artery bypass graft (to LCx system) and maze.   Rec:  Volume status looks good.  Still some dysarthria.  His daughter in law wants a family conference Monday.  TEE next week some time to help with durationm of antibiotics     W. Doristine Church  MD [facility] Cardiology  08/29/2014, 12:33 PM

## 2014-08-29 NOTE — Progress Notes (Signed)
Speech Language Pathology Treatment: Dysphagia  Patient Details Name: Justin [name] MRN: [id number] DOB: 01/06/1933 Today's Date: 08/29/2014 Time: 9323-5573 SLP Time Calculation (min): 40 min  Assessment / Plan / Recommendation Clinical Impression  SLP assisted and provided cueing as needed for self feeding and safety during am meal. Pt demonstrates both oral neuromotor impairment on right side leaving moderate residuals with solids, but also a mild respiratory based dysphagia with difficulty coordinating breathing and swallow given SOB. Hand over hand assist for small single sips with appropriate rest in between sips (no straws) is best method for intake with reduced risk of aspiration. Pt struggles to sweep right buccal cavity, particularly with ground textures. He was more successful with soft, but whole bites, such as a biscuit. Recommend pt continue current diet with max assist.    HPI HPI: Pt is a 80 y.o. male with history of CAD status post stents, hypertension, dyslipidemia, a-fib, pacemaker, ITP currently being treated with IVIG, who presents to the emergency department with complaints of fever and shortness of breath. Pt found to have bacterial endocarditis and underwent MVR, Aortic root replacement, and CABG on 07/09/2014. Pt underwent pacer placement on 8/28. Pt was found with new onset of slurred speech, difficulty expressing himself, and weakness that began in the morning of 9/25. Prior to this event patient was tolerating regular textures and thin liquids, however did not pass the RN stroke swallow screen due to coughing with thin liquids. Per protocol, SLP bedside swallow evaluation was ordered.   Pertinent Vitals Pain Assessment: No/denies pain  SLP Plan  Continue with current plan of care    Recommendations Diet recommendations: Dysphagia 3 (mechanical soft);Thin liquid Liquids provided via: Cup;No straw Medication Administration: Whole meds with puree Supervision: Staff to assist  with self feeding;Full supervision/cueing for compensatory strategies Compensations: Slow rate;Small sips/bites;Check for pocketing;Follow solids with liquid Postural Changes and/or Swallow Maneuvers: Seated upright 90 degrees              General recommendations: Rehab consult Oral Care Recommendations: Oral care BID Follow up Recommendations: Inpatient Rehab Plan: Continue with current plan of care    GO    [facility] MA CCC-SLP [phone]  Lynann Beaver 08/29/2014, 8:38 AM

## 2014-08-30 LAB — CBC
HCT: 31 % — ABNORMAL LOW (ref 39.0–52.0)
HEMOGLOBIN: 9.6 g/dL — AB (ref 13.0–17.0)
MCH: 31.5 pg (ref 26.0–34.0)
MCHC: 31 g/dL (ref 30.0–36.0)
MCV: 101.6 fL — AB (ref 78.0–100.0)
Platelets: 24 10*3/uL — CL (ref 150–400)
RBC: 3.05 MIL/uL — ABNORMAL LOW (ref 4.22–5.81)
RDW: 22.5 % — ABNORMAL HIGH (ref 11.5–15.5)
WBC: 6 10*3/uL (ref 4.0–10.5)

## 2014-08-30 LAB — GLUCOSE, CAPILLARY
GLUCOSE-CAPILLARY: 116 mg/dL — AB (ref 70–99)
GLUCOSE-CAPILLARY: 145 mg/dL — AB (ref 70–99)
Glucose-Capillary: 103 mg/dL — ABNORMAL HIGH (ref 70–99)
Glucose-Capillary: 142 mg/dL — ABNORMAL HIGH (ref 70–99)
Glucose-Capillary: 148 mg/dL — ABNORMAL HIGH (ref 70–99)

## 2014-08-30 LAB — RENAL FUNCTION PANEL
Albumin: 1.7 g/dL — ABNORMAL LOW (ref 3.5–5.2)
Anion gap: 18 — ABNORMAL HIGH (ref 5–15)
BUN: 69 mg/dL — ABNORMAL HIGH (ref 6–23)
CALCIUM: 8 mg/dL — AB (ref 8.4–10.5)
CO2: 23 meq/L (ref 19–32)
CREATININE: 2.88 mg/dL — AB (ref 0.50–1.35)
Chloride: 95 mEq/L — ABNORMAL LOW (ref 96–112)
GFR calc Af Amer: 22 mL/min — ABNORMAL LOW (ref >90)
GFR, EST NON AFRICAN AMERICAN: 19 mL/min — AB (ref >90)
GLUCOSE: 104 mg/dL — AB (ref 70–99)
PHOSPHORUS: 5.8 mg/dL — AB (ref 2.3–4.6)
Potassium: 3.9 mEq/L (ref 3.7–5.3)
Sodium: 136 mEq/L — ABNORMAL LOW (ref 137–147)

## 2014-08-30 LAB — RETICULOCYTES
RBC.: 3.05 MIL/uL — ABNORMAL LOW (ref 4.22–5.81)
RETIC COUNT ABSOLUTE: 213.5 10*3/uL — AB (ref 19.0–186.0)
RETIC CT PCT: 7 % — AB (ref 0.4–3.1)

## 2014-08-30 LAB — PROTIME-INR
INR: 1.41 (ref 0.00–1.49)
PROTHROMBIN TIME: 17.3 s — AB (ref 11.6–15.2)

## 2014-08-30 MED ORDER — FUROSEMIDE 10 MG/ML IJ SOLN
80.0000 mg | Freq: Every day | INTRAMUSCULAR | Status: DC
  Administered 2014-08-30: 80 mg via INTRAVENOUS
  Filled 2014-08-30: qty 8

## 2014-08-30 NOTE — Progress Notes (Signed)
10 Days Post-Op Procedure(s) (LRB): INSERTION PLEURAL DRAINAGE CATHETER (Bilateral) Subjective:  No specific complaints. A little more verbal today. Family says he ate a little.  Objective: Vital signs in last 24 hours: Temp:  [98.1 F (36.7 C)-98.6 F (37 C)] 98.6 F (37 C) (09/27 1639) Pulse Rate:  [57-100] 76 (09/27 1639) Cardiac Rhythm:  [-] Ventricular paced (09/27 0800) Resp:  [19-47] 27 (09/27 1639) BP: (120-160)/(60-80) 120/60 mmHg (09/27 1639) SpO2:  [92 %-100 %] 99 % (09/27 1639) Weight:  [81.2 kg (179 lb 0.2 oz)] 81.2 kg (179 lb 0.2 oz) (09/27 0500)  Hemodynamic parameters for last 24 hours:    Intake/Output from previous day: 09/26 0701 - 09/27 0700 In: 1230 [P.O.:530; I.V.:350; IV Piggyback:350] Out: 950 [Urine:375; Chest Tube:575] Intake/Output this shift: Total I/O In: 200 [IV Piggyback:200] Out: -   General appearance: slowed mentation Neurologic: dysarthric, follows commands. good strength in ext Heart: regular rate and rhythm, S1, S2 normal, no murmur, click, rub or gallop Lungs: diminished breath sounds bibasilar Abdomen: soft, non-tender; bowel sounds normal; no masses,  no organomegaly Extremities: extremities normal, atraumatic, no cyanosis or edema Wound: incision well-healed.  Lab Results:  Recent Labs  08/29/14 0415 08/30/14 0307  WBC 4.5 6.0  HGB 9.2* 9.6*  HCT 29.0* 31.0*  PLT 21* 24*   BMET:  Recent Labs  08/29/14 0415 08/30/14 0307  NA 138 136*  K 3.5* 3.9  CL 97 95*  CO2 26 23  GLUCOSE 101* 104*  BUN 68* 69*  CREATININE 2.72* 2.88*  CALCIUM 8.2* 8.0*    PT/INR:  Recent Labs  08/30/14 0307  LABPROT 17.3*  INR 1.41   ABG    Component Value Date/Time   PHART 7.395 07/25/2014 2059   HCO3 19.5* 07/25/2014 2059   TCO2 20 07/25/2014 2059   ACIDBASEDEF 5.0* 07/25/2014 2059   O2SAT 66.3 08/31/2014 0435   CBG (last 3)   Recent Labs  08/30/14 0802 08/30/14 1200 08/30/14 1642  GLUCAP 103* 116* 145*     Assessment/Plan: S/P Procedure(s) (LRB): INSERTION PLEURAL DRAINAGE CATHETER (Bilateral)  1. CV - Paced. On ecasa 81 daily and Coumadin. INR 1.41.  2. Pulmonary - s/p bilateral Pleur X catheters.  3. Volume Overload - Lasix decreased to daily by nephrology.  4. Anemia-H and H stable at 9.6 and 31. Etiology multifactorial. Was on Aranesp but stopped per Dr. Marin Olp  5. Neuro-had new dysarthria Friday  am. Likely CVA (embolic). CT head showed bilateral cerebellar lunar infarcts (appear increased in number since August), no acute hemorrhage. He seemed worse yesterday but a little better today. Will need TEE eventually.  6.AoCKD (baseline creatinine 1.5), ATN. Creatinine this am 2.88. Has not had HD since 9/16. Per nephrology  7.Thrombocytopenia-Platelets 24,000. On IVIG, s/p Nplate. Per hematology  8.ID-Enterococcus endocarditis. On Ampicillin and Rocephin.  9. CBGs 105/118/103. Pre op HGA1C 5.1. On dysphagia 3 diet. SS PRN.    LOS: 55 days    Bingham Millette K 08/30/2014

## 2014-08-30 NOTE — Progress Notes (Signed)
Admit: 07/27/2014 LOS: 28  Justin [name] w/ AoCKD 2/2 ATN (BL SCr 1.5) in setting of enterococcal IE, candidemia, s/p: AVR, MVR, CABG, MAZE on CRRT 9/8-9/14/15, last HD 08/19/14  Subjective:  Poor PO, made DNR yesterday Family meeting tomorrow Daughter in [place] updated at bedside Several unmeasured voids  09/26 0701 - 09/27 0700 In: 1230 [P.O.:530; I.V.:350; IV Piggyback:350] Out: 950 [Urine:375; Chest Tube:575]  Filed Weights   08/28/14 0300 08/29/14 0315 08/30/14 0500  Weight: 81.8 kg (180 lb 5.4 oz) 81.8 kg (180 lb 5.4 oz) 81.2 kg (179 lb 0.2 oz)    Scheduled Meds: . ampicillin (OMNIPEN) IV  2 g Intravenous 3 times per day  . antiseptic oral rinse  7 mL Mouth Rinse BID  . aspirin EC  81 mg Oral Daily  . atorvastatin  10 mg Oral QPM  . cefTRIAXone (ROCEPHIN)  IV  2 g Intravenous Q12H  . feeding supplement (ENSURE COMPLETE)  237 mL Oral BID BM  . fluconazole (DIFLUCAN) IV  200 mg Intravenous Q24H  . folic acid  2 mg Intravenous Daily  . furosemide  80 mg Intravenous Daily  . IMMUNE GLOBULIN 10% (HUMAN) IV - For Fluid Restriction Only  400 mg/kg Intravenous Q24H  . insulin aspart  0-15 Units Subcutaneous TID WC  . insulin aspart  0-5 Units Subcutaneous QHS  . pantoprazole (PROTONIX) IV  40 mg Intravenous Q24H  . pyridOXINE  100 mg Oral QPM  . vitamin B-12  1,000 mcg Oral QPM  . vitamin E  400 Units Oral Daily  . warfarin  2 mg Oral q1800  . Warfarin - Physician Dosing Inpatient   Does not apply q1800   Continuous Infusions:  PRN Meds:.acetaminophen, docusate sodium, levalbuterol, ondansetron (ZOFRAN) IV, ondansetron (ZOFRAN) IV, ondansetron, promethazine, sodium chloride, traMADol, zolpidem   Physical Exam:  Blood pressure 123/67, pulse 81, temperature 98.6 F (37 C), temperature source Oral, resp. rate 19, height 5\' 11"  (1.803 m), weight 81.2 kg (179 lb 0.2 oz), SpO2 98.00%. NAD, in bed, confused Nl S1S2 CTAB Ant auscultation S/nt/nd 1+LEE  Assessment 1. AoCKD (BL SCr  1.5) 2/2 ATN, off HD since 08/19/14; Stable UOP on IV lasix with Stable GFR w/ good electrolyte control.   2. Anemia, multifactorial, off ESA 2/2 acute CVA 08/28/14 3. S/p AVR, MVR, CABG, MAZE: per TCTS and Cardiology 4. Enterococcal IE and Fungemia: RCID following; on ampicillin/ceftriaxone 5. Pleural effusiosn w/ pleurex catheter 6. TCP, hematology following, started IVIG 08/27/14; s/p NPlate 7. 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 Poor PO intake after CVA  Plan 1. Stable GFR, might be his new or at least current baseline   2. Reduce lasix to daily because of poor intake; might need to hold altogether  Pearson Grippe MD 08/30/2014, 8:58 AM   Recent Labs Lab 08/28/14 0320 08/29/14 0415 08/30/14 0307  NA 140 138 136*  K 3.8 3.5* 3.9  CL 98 97 95*  CO2 25 26 23   GLUCOSE 84 101* 104*  BUN 68* 68* 69*  CREATININE 2.69* 2.72* 2.88*  CALCIUM 8.0* 8.2* 8.0*  PHOS 4.7* 4.8* 5.8*    Recent Labs Lab 08/28/14 0320 08/29/14 0415 08/30/14 0307  WBC 4.4 4.5 6.0  HGB 9.1* 9.2* 9.6*  HCT 28.4* 29.0* 31.0*  MCV 99.6 99.7 101.6*  PLT 25* 21* 24*

## 2014-08-30 NOTE — Progress Notes (Signed)
Chart briefly reviewed Plan is for care conference in am No acute issues  Will see again in am

## 2014-08-30 NOTE — Progress Notes (Signed)
STROKE TEAM PROGRESS NOTE   HISTORY  Justin [name] is an 80 y.o. male admitted to hospital on 07/15/2014 for heart failure and hypotension. He is currently being followed by Dr. Marin Olp for ITP and treated with systemic steroids and was treated with IVIG. While in hospital he was noted to have both enterococcus and Enterobacter in his urine and blood. 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 On 07/29/2014 he underwent a Aortic root replacement, MV replacement, CABG and Maze procedure. While hospitalized he developed ATN and required hemodialysis. His kidney function improved and has been of HD since 08-19-2014. Patient was improving slowly. This AM night nurse check on him and placed him I the chair at 0700 hours. At that time he was his baseline. Morning nurse came to check on him and noted he was having difficulty expressing himself and on exam she noted new onset of right arm weakness. Code stroke was called. Patient was brought to CT where no acute infarct was found but did show bilateral cerebellar lacunar infarcts increased in number since August. Patient was not a tPA candidate due to PT >15 and PLT count of 25.   Date last known well: Date: 08/28/2014  Time last known well: Time: 07:00  tPA Given: No: PLT 25, PT >15.9      SUBJECTIVE (INTERVAL HISTORY) Daughter in-law at bedside. Dr Merlene Laughter  Discussed case w family. She thinks he is more respsonsive.     OBJECTIVE Temp:  [98.2 F (36.8 C)-99.5 F (37.5 C)] 98.8 F (37.1 C) (09/26 0820) Pulse Rate:  [78-88] 86 (09/26 0820) Cardiac Rhythm:  [-] Ventricular paced (09/25 2000) Resp:  [14-51] 31 (09/26 0820) BP: (114-151)/(49-100) 114/67 mmHg (09/26 0820) SpO2:  [90 %-100 %] 94 % (09/26 0820) Weight:  [180 lb 5.4 oz (81.8 kg)] 180 lb 5.4 oz  (81.8 kg) (09/26 0315)   Recent Labs Lab 08/28/14 2131 08/29/14 0824 08/29/14 1203 08/29/14 1649 08/29/14 2132  GLUCAP 111* 95 110* 105* 118*    Recent Labs Lab 08/26/14 0650 08/27/14 0357 08/28/14 0320 08/29/14 0415 08/30/14 0307  NA 137 138 140 138 136*  K 4.3 4.2 3.8 3.5* 3.9  CL 99 97 98 97 95*  CO2 26 27 25 26 23   GLUCOSE 86 83 84 101* 104*  BUN 64* 70* 68* 68* 69*  CREATININE 2.55* 2.73* 2.69* 2.72* 2.88*  CALCIUM 8.3* 8.2* 8.0* 8.2* 8.0*  PHOS 4.2 4.8* 4.7* 4.8* 5.8*    Recent Labs Lab 08/26/14 0650 08/27/14 0357 08/28/14 0320 08/29/14 0415 08/30/14 0307  ALBUMIN 1.9* 1.9* 1.8* 1.8* 1.7*    Recent Labs Lab 08/26/14 0650 08/27/14 0357 08/28/14 0320 08/29/14 0415 08/30/14 0307  WBC 5.4 5.2 4.4 4.5 6.0  HGB 9.5* 9.1* 9.1* 9.2* 9.6*  HCT 30.1* 29.1* 28.4* 29.0* 31.0*  MCV 101.7* 100.0 99.6 99.7 101.6*  PLT 35* 29* 25* 21* 24*   No results found for this basename: CKTOTAL, CKMB, CKMBINDEX, TROPONINI,  in the last 168 hours  Recent Labs  08/28/14 0320 08/29/14 0415 08/30/14 0307  LABPROT 15.9* 16.1* 17.3*  INR 1.27 1.29 1.41   No results found for this basename: COLORURINE, APPERANCEUR, LABSPEC, PHURINE, GLUCOSEU, HGBUR, BILIRUBINUR, KETONESUR, PROTEINUR, UROBILINOGEN, NITRITE, LEUKOCYTESUR,  in the last 72 hours     Component Value Date/Time   CHOL 125 08/12/2014 1600   TRIG 66 01/13/2014  0849   HDL 49 01/13/2014 0849   CHOLHDL 2.6 01/13/2014 0849   VLDL 13 01/13/2014 0849   LDLCALC 64 01/13/2014 0849   Lab Results  Component Value Date   HGBA1C 5.4 07/21/2014   No results found for this basename: labopia,  cocainscrnur,  labbenz,  amphetmu,  thcu,  labbarb    No results found for this basename: ETH,  in the last 168 hours  Ct Head Wo Contrast 08/28/2014 1. Bilateral cerebellar lacunar infarcts, appear increased in number since August.  2. No other acute intracranial abnormality identified.      PHYSICAL EXAM GENERAL: No distress.  Appears globally weak  HEENT: normal  ABDOMEN: soft  EXTREMITIES: No edema   SKIN: Normal by inspection.    MENTAL STATUS: Alert. Marked dysarthria. Follows commands.    CRANIAL NERVES: Pupils are equal, round and reactive to light and accomodation; extra ocular movements are full, there is no significant nystagmus; dense L homonomous hemianopia;  there is flattening of the nasolabial fold-R; tongue is midline.  MOTOR: Atrophy -R FDI both arms 4-/5; legs 5/5  COORDINATION: Left finger to nose is normal, right finger to nose is normal, No rest tremor; no intention tremor; no postural tremor; no bradykinesia.    ASSESSMENT/PLAN  Justin [name] is a 80 y.o. male with history of coronary artery disease, dyslipidemia, hypertension, atrial fibrillation, permanent pacemaker, and obstructive sleep apnea presenting with right upper extremity weakness.Marland Kitchen He did not receive TPA secondary to thrombocytopenia.  Imaging confirms bilateral cerebellar lacunar infarcts, appear increased in number since August felt to be embolic.  Stroke - bilateral cerebellar lacunar infarcts believed to be embolic secondary to atrial fibrillation  R large vessel hemispheric infarct with dense L homonomous hemianopia -- due to Afib   MRI  not ordered secondary to permanent pacemaker  MRA  not ordered secondary to permanent pacemaker Carotid Doppler - 07/11/2014 - The vertebral arteries appear patent with antegrade flow.- Findings consistent with 1- 39 percent stenosis involving the right internal carotid artery and the left internal carotid artery. Suggestive of 50-99% stenosis of the right subclavian artery, possibly also more proximal into the innominate artery, based on turbulent flow. Left subclavian artery is within normal limits.                                 2D Echo - 08/14/2014 - ejection fraction 35-40%    no antithrombotics prior to admission, now on warfarin... Increase risk due thrombocytopenia  but w multiple strokes already.    LDL 64 - on Lipitor prior to admission .  HgbA1c 5.4 WNL  SCDs / Warfarin for VTE prophylaxis  Dysphagia 3 with thin liquids.   Out of bed   Resultant dysarthria and L field CUT  Therapy recommendations: - CIR recommended  Ongoing aggressive risk factor management  Risk factor education  Patient counseled to be compliant with his antithrombotic medications  Disposition:  Pending  Hypertension   Permissive hypertension <220/120 for 24-48 hours and then gradually normalize within 5-7 days  BP goal long term normotensive  BP (114-151)/(49-100) 114/67 mmHg (09/26 0820) past 24h (08/30/2014 @ 7:55 AM)  Stable  Patient counseled to be compliant with his blood pressure medications  Hyperlipidemia  Home meds:  Lipitor - resumed in hospital.  LDL 64, goal < 100 (<70 for diabetics)  Continue statin at discharge  Diabetes  HgbA1c 5.4, Goal < 7.0  Controlled  Other Stroke Risk Factors Advanced age ETOH use    Family hx stroke - mother and brother   Coronary artery disease  Other Active Problems  Anemia - stable -    H/H 9.6/31  Renal insufficiency  Other Pertinent History  ITP - (idiopathic thrombocytopenic purpura) Dr Marin Olp - Plts 21K  WPW  On 07/17/2014 he underwent a Aortic root replacement, MV replacement, CABG and Maze procedure.  History of endocarditis  Permanent pacemaker  Warfarin therapy - INR 1.41 on Sunday 08/30/2014  Hospital day # [id number] PA-C [facility] Pager [phone] 08/30/2014, 7:55 AM  The patient was seen and examined by me; notes, chart and tests reviewed and discussed with midlevel provider, other providers, patient, and family.      To contact Stroke Continuity provider, please refer to [email or web address] After hours, contact General Neurology

## 2014-08-31 ENCOUNTER — Inpatient Hospital Stay (HOSPITAL_COMMUNITY): Payer: Medicare Other

## 2014-08-31 DIAGNOSIS — R5381 Other malaise: Secondary | ICD-10-CM

## 2014-08-31 LAB — CBC
HCT: 29.6 % — ABNORMAL LOW (ref 39.0–52.0)
HEMOGLOBIN: 9.1 g/dL — AB (ref 13.0–17.0)
MCH: 32.3 pg (ref 26.0–34.0)
MCHC: 30.7 g/dL (ref 30.0–36.0)
MCV: 105 fL — AB (ref 78.0–100.0)
Platelets: 21 10*3/uL — CL (ref 150–400)
RBC: 2.82 MIL/uL — AB (ref 4.22–5.81)
RDW: 22.5 % — ABNORMAL HIGH (ref 11.5–15.5)
WBC: 7.2 10*3/uL (ref 4.0–10.5)

## 2014-08-31 LAB — RENAL FUNCTION PANEL
ALBUMIN: 1.8 g/dL — AB (ref 3.5–5.2)
ANION GAP: 17 — AB (ref 5–15)
BUN: 74 mg/dL — ABNORMAL HIGH (ref 6–23)
CO2: 24 mEq/L (ref 19–32)
CREATININE: 3.16 mg/dL — AB (ref 0.50–1.35)
Calcium: 8 mg/dL — ABNORMAL LOW (ref 8.4–10.5)
Chloride: 96 mEq/L (ref 96–112)
GFR calc Af Amer: 20 mL/min — ABNORMAL LOW (ref >90)
GFR calc non Af Amer: 17 mL/min — ABNORMAL LOW (ref >90)
Glucose, Bld: 118 mg/dL — ABNORMAL HIGH (ref 70–99)
POTASSIUM: 4 meq/L (ref 3.7–5.3)
Phosphorus: 6 mg/dL — ABNORMAL HIGH (ref 2.3–4.6)
Sodium: 137 mEq/L (ref 137–147)

## 2014-08-31 LAB — GLUCOSE, CAPILLARY
GLUCOSE-CAPILLARY: 101 mg/dL — AB (ref 70–99)
Glucose-Capillary: 108 mg/dL — ABNORMAL HIGH (ref 70–99)
Glucose-Capillary: 94 mg/dL (ref 70–99)

## 2014-08-31 LAB — PROTIME-INR
INR: 1.67 — ABNORMAL HIGH (ref 0.00–1.49)
Prothrombin Time: 19.7 seconds — ABNORMAL HIGH (ref 11.6–15.2)

## 2014-08-31 LAB — RETICULOCYTES
RBC.: 2.82 MIL/uL — AB (ref 4.22–5.81)
RETIC COUNT ABSOLUTE: 203 10*3/uL — AB (ref 19.0–186.0)
Retic Ct Pct: 7.2 % — ABNORMAL HIGH (ref 0.4–3.1)

## 2014-08-31 MED ORDER — MORPHINE SULFATE 2 MG/ML IJ SOLN
2.0000 mg | INTRAMUSCULAR | Status: DC | PRN
  Administered 2014-08-31 – 2014-09-01 (×5): 2 mg via INTRAVENOUS
  Filled 2014-08-31 (×5): qty 1

## 2014-08-31 MED ORDER — ASPIRIN 81 MG PO CHEW
81.0000 mg | CHEWABLE_TABLET | Freq: Every day | ORAL | Status: DC
  Administered 2014-08-31: 81 mg via ORAL
  Filled 2014-08-31: qty 1

## 2014-08-31 NOTE — Evaluation (Signed)
Physical Therapy Re-Evaluation Patient Details Name: Justin [name] MRN: [id number] DOB: 07/02/1933 Today's Date: 08/31/2014   History of Present Illness  Pt is a 80 y.o. male with history of CAD status post stents, hypertension, dyslipidemia, a-fib, pacemaker, ITP currently being treated with IVIG, who presents to the emergency department with complaints of fever and shortness of breath. 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 Pt underwent pacer placement on 8/28. Placed on CRRT 9/8-9/13. Pt with CVA on fri 9/25, and area of infarct has expanded over the weekend per RN.   Clinical Impression  Pt admitted with the above. Pt currently with functional limitations due to the deficits listed below (see PT Problem List). At the time of PT re-eval focus of session was to reposition patient onto his R side as the RN states she is concerned for skin breakdown. All movement was with max/total assist. No movement noted on R side, however pt with elbow extension when attempting to squeeze therapist's hand on the L side. Pt occasionally moving LE's in bed. Pt will benefit from skilled PT to increase their independence and safety with mobility to allow discharge to the venue listed below. Pt's family to have a goals of care meeting this afternoon, and PT will follow up as appropriate.      Follow Up Recommendations LTACH;Other (comment) (Possibly hospice)    Equipment Recommendations  Hospital bed;Wheelchair (measurements PT);Wheelchair cushion (measurements PT);Other (comment) Harrel Lemon lift)    Recommendations for Other Services       Precautions / Restrictions Precautions Precautions: Fall;Sternal;ICD/Pacemaker Restrictions Weight Bearing Restrictions: No      Mobility  Bed Mobility               General bed mobility comments: Pt was repositioned in bed to off-weight sacrum. Total assist to roll and place pillows, with assist for  LE positioning (feet on pillow against foot of bed), and pillows under arms for shoulder comfort. Total assist +2 to scoot pt up to [facility].   Transfers                 General transfer comment: Deferred - not appropriate at this time.   Ambulation/Gait                Stairs            Wheelchair Mobility    Modified Rankin (Stroke Patients Only)       Balance                                             Pertinent Vitals/Pain Pain Assessment: Faces Faces Pain Scale: Hurts little more Pain Location: During coughing Pain Intervention(s): Monitored during session;Repositioned    Home Living                        Prior Function                 Hand Dominance        Extremity/Trunk Assessment                         Communication      Cognition Arousal/Alertness: Lethargic Behavior During Therapy: Restless Overall Cognitive Status: Impaired/Different from baseline  General Comments: Pt answered "yes" to RN once throughout session, and otherwise did not speak.     General Comments General comments (skin integrity, edema, etc.): Pt demonstrates elbow extension to try and squeeze therapist's hands, mainly on L side. Noted no UE movement on the R side. Pt with "restless legs", moving and adjusting his leg position throughout session.     Exercises        Assessment/Plan    PT Assessment    PT Diagnosis     PT Problem List    PT Treatment Interventions     PT Goals (Current goals can be found in the Care Plan section) Acute Rehab PT Goals Patient Stated Goal: Pt unable to state goals. Family's goal is positioning and comfort.  PT Goal Formulation: Patient unable to participate in goal setting Time For Goal Achievement: 09/07/14 Potential to Achieve Goals: Poor    Frequency Min 3X/week   Barriers to discharge        Co-evaluation               End of Session    Activity Tolerance: Patient limited by lethargy;Treatment limited secondary to medical complications (Comment) Patient left: in bed;with call bell/phone within reach;with nursing/sitter in room;with family/visitor present Nurse Communication: Mobility status         Time: 7939-0300 PT Time Calculation (min): 15 min   Charges:   PT Evaluation $PT Re-evaluation: 1 Procedure PT Treatments $Therapeutic Activity: 8-22 mins   PT G Codes:          Jolyn Lent 08/31/2014, 4:02 PM  Jolyn Lent, PT, DPT Acute Rehabilitation Services Pager: [phone]

## 2014-08-31 NOTE — Progress Notes (Signed)
Mr. Corpus looks a lot worse. He has clearly declined. He had a stroke on Friday. It looks like he might be "completely" the stroke. This patient wars. He is more confused. He does not have a lot of movement on the right side.  I would think that if he continues like this, he was not going to make it through this week. I don't think that dialysis going to change this. I don't think that transfusing him is going to change this.  He is a DO NOT RESUSCITATE now. I certainly would not argue with just comfort care for him. He's been in the hospital now for over 2 months. He's been through quite a lot. This this is just another event that he is not going to overcome.  His platelet count is 21. His hemoglobin is 9.1. BUN 74 and creatinine 3.14. His blood sugar is 118.  He really did not know who I was. Again, he just looks worse than when I saw him on Friday. He just stares a lot.  On his physical exam, temperature 90.8. Pulse 89. Blood pressure 132/65. Oxygen saturations 98%. His lungs show somewhat decreased at the bases. Has some scattered crackles and wheezes. Cardiac exam regular in rhythm. Abdomen soft. Bowel sounds decreased. There is no obvious liver or spleen tip. Head and exam shows some conjunctival inflammation. His pupils reacted. Extremities shows some trace edema in his legs.  Again, I think this  cerebrovascular event will be the "final" event for him. This am surprised how he has declined. Again he just did not know who I was. His speech is a lot worse.  I think there may be some family meeting today. Again, comfort care so I would not be a bad option.  I will continue pray hard for him and his family.   Pete E.  Romans 5:3-5

## 2014-08-31 NOTE — Progress Notes (Addendum)
TCTS BRIEF PROGRESS NOTE   I had a lengthy conversation with the patient's wife and family this evening.  Given the patient's dramatic deterioration since he suffered a recurrent stroke 3 days ago we all agree that his chances for a meaningful recovery have evaporated.  The patient's wife and family specifically request that he be transitioned to palliative care only.  I completely support this request and discussed options.  We will administer medications for comfort only, stop all other medications, and ask the Palliative Care Team to take over his care and transfer him to the Inpatient Hospice unit.  Rexene Alberts 08/31/2014 5:38 PM

## 2014-08-31 NOTE — Progress Notes (Signed)
Chart and note reviewed. Agree with note.  Kimberly Harris, RD, LDN, CNSC Pager [phone] After Hours Pager [phone]   

## 2014-08-31 NOTE — Progress Notes (Addendum)
Speech Language Pathology Treatment: Dysphagia  Patient Details Name: Justin [name] MRN: [id number] DOB: February 28, 1933 Today's Date: 08/31/2014 Time: 7867-6720 SLP Time Calculation (min): 11 min  Assessment / Plan / Recommendation Clinical Impression  Pt. Appears to have declined over past 2 days per comparison with assessments over the weekend (speech-lang-cognition and swallow treatment).  Pt.'s eyes open, unable to visually focus, follows simple commands with repetition.  Speech completely unintelligible at present.  Oral care revealed dried blood on posterior portion of hard palate; unable to remove due to pt.'s significant gag reflex.  Several ice chips given to moisten oral mucosa with poor oral control and questionable if swallow was even initiated.  He is at high aspiration risk and should not eat/drink unless fully alert and aware.  Family meeting scheduled today (comfort care?).  He may benefit from modified diet of   to promote increased bolus cohesion, control and manipulation.  ST will downgrade diet to Dys 1 and nectar thick liquids.   HPI HPI: Pt is a 80 y.o. male with history of CAD status post stents, hypertension, dyslipidemia, a-fib, pacemaker, ITP currently being treated with IVIG, who presents to the emergency department with complaints of fever and shortness of breath. Pt found to have bacterial endocarditis and underwent MVR, Aortic root replacement, and CABG on 07/22/2014. Pt underwent pacer placement on 8/28. Pt was found with new onset of slurred speech, difficulty expressing himself, and weakness that began in the morning of 9/25. Prior to this event patient was tolerating regular textures and thin liquids, however did not pass the RN stroke swallow screen due to coughing with thin liquids. Per protocol, SLP bedside swallow evaluation was ordered.   Pertinent Vitals Pain Assessment:  (appears in pain, unable to determine specifics) Pain Intervention(s): Repositioned  SLP Plan  Continue with current plan of care    Recommendations Diet recommendations: Dysphagia 3 (mechanical soft); Nectar thick liquids Liquids provided via: Cup;No straw Medication Administration: Crushed with puree Supervision: Staff to assist with self feeding;Full supervision/cueing for compensatory strategies Compensations: Slow rate;Small sips/bites;Check for pocketing;Follow solids with liquid (only when alert) Postural Changes and/or Swallow Maneuvers: Seated upright 90 degrees              Oral Care Recommendations: Oral care BID Follow up Recommendations: 24 hour supervision/assistance;Skilled Nursing facility Plan: Continue with current plan of care    GO     Houston Siren 08/31/2014, 9:46 AM  Orbie Pyo Colvin Caroli.Ed [employer] [phone]

## 2014-08-31 NOTE — Progress Notes (Signed)
Subjective:  Patient confused this AM; does not respond to questions appropriately   Objective:   Scheduled Meds: . ampicillin (OMNIPEN) IV  2 g Intravenous 3 times per day  . antiseptic oral rinse  7 mL Mouth Rinse BID  . aspirin EC  81 mg Oral Daily  . atorvastatin  10 mg Oral QPM  . cefTRIAXone (ROCEPHIN)  IV  2 g Intravenous Q12H  . feeding supplement (ENSURE COMPLETE)  237 mL Oral BID BM  . fluconazole (DIFLUCAN) IV  200 mg Intravenous Q24H  . folic acid  2 mg Intravenous Daily  . IMMUNE GLOBULIN 10% (HUMAN) IV - For Fluid Restriction Only  400 mg/kg Intravenous Q24H  . insulin aspart  0-15 Units Subcutaneous TID WC  . insulin aspart  0-5 Units Subcutaneous QHS  . pantoprazole (PROTONIX) IV  40 mg Intravenous Q24H  . pyridOXINE  100 mg Oral QPM  . vitamin B-12  1,000 mcg Oral QPM  . vitamin E  400 Units Oral Daily  . warfarin  2 mg Oral q1800  . Warfarin - Physician Dosing Inpatient   Does not apply q1800   Continuous Infusions:   PRN Meds:.acetaminophen, docusate sodium, levalbuterol, ondansetron (ZOFRAN) IV, ondansetron (ZOFRAN) IV, ondansetron, promethazine, sodium chloride, traMADol, zolpidem  Filed Vitals:   08/31/14 0600 08/31/14 0700 08/31/14 0800 08/31/14 0900  BP: 132/65 126/78 148/87   Pulse: 89 89 92 93  Temp:  97.9 F (36.6 C)    TempSrc:  Oral    Resp: 29 22 28 24   Height:      Weight:      SpO2: 98% 96% 97% 96%    Intake/Output from previous day:  Intake/Output Summary (Last 24 hours) at 08/31/14 1042 Last data filed at 08/31/14 0521  Gross per 24 hour  Intake    870 ml  Output    700 ml  Net    170 ml    Physical Exam:  General: Frail Skin is warm and dry.  HEENT is normal.  Neck is supple Chest diminished BS bases Cor Regualr rate and rhythm.  Abdominal exam nontender or distended. No masses palpated. Extremities show trace edema neuro Patient with dysarthria and deceased strength/movement on right side; confused  Tele : V  pacing   Lab Results: Basic Metabolic Panel:  Recent Labs  08/30/14 0307 08/31/14 0220  NA 136* 137  K 3.9 4.0  CL 95* 96  CO2 23 24  GLUCOSE 104* 118*  BUN 69* 74*  CREATININE 2.88* 3.16*  CALCIUM 8.0* 8.0*  PHOS 5.8* 6.0*   CBC:  Recent Labs  08/30/14 0307 08/31/14 0220  WBC 6.0 7.2  HGB 9.6* 9.1*  HCT 31.0* 29.6*  MCV 101.6* 105.0*  PLT 24* 21*     Assessment/Plan:  1. Status post aortic root replacement, bioprosthetic aortic valve replacement, bioprosthetic mitral valve replacement, coronary artery bypass graft (to LCx system) and maze. 2. Acute on chronic kidney failure-nephrology following.  3. Thrombocytopenia- ITP.  Hematology following. 4. Paroxysmal atrial fibrillation 5. CAD-Had SVG-LCx system with recent cardiac surgery.  6. Status post pacemaker for CHB 7. Enterococcal endocarditis 8. Acute diastolic CHF with prominent RV failure.  9. Acute respiratory failure-resolved 10. Fungemia/funguria 11. Pleural effusions 12. Recent CVA  PLAN/DISCUSSION  Patient has had a significant CVA. This is most likely related to embolic event either from PAF or vegetation on AVR or MVR. He has continued to deteriorate over the last 3 days. He is confused this morning  with worsening right-sided weakness. Not a candidate for TEE at this point. Continue antibiotics for recent enterococcal antibiotics and fungemia. Nephrology following and managing diuretics; renal function worse. Hematology continues to follow for thrombocytopenia. Given continued deterioration with significant CVA I agree prognosis is poor. I also agree that palliative care/hospice is appropriate. Apparently family meeting has been scheduled for later today.  Aaron Edelman Crenshaw,MD 10:42 AM

## 2014-08-31 NOTE — Progress Notes (Signed)
Met with family to discuss comfort measures for patient.  Family requests that they be able to stay with patient at all times. One family member will stay at night with the patient. Support given to family.

## 2014-08-31 NOTE — Progress Notes (Addendum)
[facility] DAILY ICU PROGRESS NOTE                   [address]            [place] [address]          [phone]   11 Days Post-Op Procedure(s) (LRB): INSERTION PLEURAL DRAINAGE CATHETER (Bilateral)  Total Length of Stay:  LOS: 56 days   Subjective: Agitated, moaning, speech unintelligible.    Objective: Vital signs in last 24 hours: Temp:  [97.9 F (36.6 C)-98.6 F (37 C)] 97.9 F (36.6 C) (09/28 0700) Pulse Rate:  [76-89] 89 (09/28 0700) Cardiac Rhythm:  [-] Ventricular paced (09/28 0400) Resp:  [16-29] 22 (09/28 0700) BP: (117-139)/(60-81) 126/78 mmHg (09/28 0700) SpO2:  [94 %-100 %] 96 % (09/28 0700) Weight:  [179 lb 14.3 oz (81.6 kg)] 179 lb 14.3 oz (81.6 kg) (09/28 0300)  Filed Weights   08/29/14 0315 08/30/14 0500 08/31/14 0300  Weight: 180 lb 5.4 oz (81.8 kg) 179 lb 0.2 oz (81.2 kg) 179 lb 14.3 oz (81.6 kg)    Weight change: 14.1 oz (0.4 kg)   Hemodynamic parameters for last 24 hours:    Intake/Output from previous day: 09/27 0701 - 09/28 0700 In: 1090 [P.O.:490; I.V.:250; IV Piggyback:350] Out: 850 [Urine:400; Chest Tube:450]  Intake/Output this shift:    Current Meds: Scheduled Meds: . ampicillin (OMNIPEN) IV  2 g Intravenous 3 times per day  . antiseptic oral rinse  7 mL Mouth Rinse BID  . aspirin EC  81 mg Oral Daily  . atorvastatin  10 mg Oral QPM  . cefTRIAXone (ROCEPHIN)  IV  2 g Intravenous Q12H  . feeding supplement (ENSURE COMPLETE)  237 mL Oral BID BM  . fluconazole (DIFLUCAN) IV  200 mg Intravenous Q24H  . folic acid  2 mg Intravenous Daily  . furosemide  80 mg Intravenous Daily  . IMMUNE GLOBULIN 10% (HUMAN) IV - For Fluid Restriction Only  400 mg/kg Intravenous Q24H  . insulin aspart  0-15 Units Subcutaneous TID WC  . insulin aspart  0-5 Units Subcutaneous QHS  . pantoprazole (PROTONIX) IV  40 mg Intravenous Q24H  . pyridOXINE  100 mg Oral QPM  . vitamin B-12  1,000 mcg Oral QPM  . vitamin E  400 Units Oral Daily    . warfarin  2 mg Oral q1800  . Warfarin - Physician Dosing Inpatient   Does not apply q1800   Continuous Infusions:  PRN Meds:.acetaminophen, docusate sodium, levalbuterol, ondansetron (ZOFRAN) IV, ondansetron (ZOFRAN) IV, ondansetron, promethazine, sodium chloride, traMADol, zolpidem   Physical Exam: Neurologic: Alert, significant dysarthria, moves extremities spontaneously but not following commands for me Heart: RRR, paced at 90 Lungs: coarse bilateral BS Abdomen: soft, non-tender; bowel sounds normal; no masses,  no organomegaly Extremities: extremities normal, atraumatic, no cyanosis or edema   Lab Results: CBC: Recent Labs  08/30/14 0307 08/31/14 0220  WBC 6.0 7.2  HGB 9.6* 9.1*  HCT 31.0* 29.6*  PLT 24* 21*   BMET:  Recent Labs  08/30/14 0307 08/31/14 0220  NA 136* 137  K 3.9 4.0  CL 95* 96  CO2 23 24  GLUCOSE 104* 118*  BUN 69* 74*  CREATININE 2.88* 3.16*  CALCIUM 8.0* 8.0*    PT/INR:  Recent Labs  08/31/14 0220  LABPROT 19.7*  INR 1.67*   Radiology: Dg Chest Port 1 View  08/31/2014   CLINICAL DATA:  Followup pleural effusions.  EXAM: PORTABLE CHEST -  1 VIEW  COMPARISON:  08/24/2014  FINDINGS: Right pacer remains in place, unchanged. Left pleural catheter is unchanged. Heart is borderline in size. Mild perihilar airspace opacities have slightly increased since prior study and may reflect early edema. No effusions or pneumothorax. No acute bony abnormality.  IMPRESSION: Slight increase mild perihilar airspace opacities, question edema.   Electronically Signed   By: Rolm Baptise M.D.   On: 08/31/2014 07:12     Assessment/Plan: S/P Procedure(s) (LRB): INSERTION PLEURAL DRAINAGE CATHETER (Bilateral)  CVA-  Family to have care conference today. Neuro/stroke team following. DNR.  CV- stable at present  A/CKD- Cr worsening. Renal following. Lasix decreased yesterday, may need to d/c altogether.  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  In light of new onset CVA, may need repeat TEE to determine course of therapy.  Bilateral effusions/edema- s/p bilateral PleuRx catheters. R=300, L=150 yesterday. Continue daily drainage.  Thrombocytopenia- plts 21K, heme following.   COLLINS,GINA H 08/31/2014 8:56 AM   I have seen and examined the patient and agree with the assessment and plan as outlined.  Mr Vachon's neurologic status has deteriorated substantially over the last 3 days.  Will try to meet with patient's family later today.  He has already been made NO CODE BLUE but I would support transitioning to palliative care only at this point.  I think his chances for a reasonable recovery are unfortunately non-existent given the severity of his stroke.  [name],[name] 08/31/2014 9:36 AM

## 2014-08-31 NOTE — Progress Notes (Signed)
[facility] ROUNDING NOTE   Subjective:   Interval History: Reviewed notes of consulting services and concur with the conclusions drawn  Objective:  Vital signs in last 24 hours:  Temp:  [97.9 F (36.6 C)-98.6 F (37 C)] 97.9 F (36.6 C) (09/28 0700) Pulse Rate:  [76-93] 93 (09/28 0900) Resp:  [16-29] 24 (09/28 0900) BP: (117-148)/(60-87) 148/87 mmHg (09/28 0800) SpO2:  [94 %-100 %] 96 % (09/28 0900) Weight:  [81.6 kg (179 lb 14.3 oz)] 81.6 kg (179 lb 14.3 oz) (09/28 0300)  Weight change: 0.4 kg (14.1 oz) Filed Weights   08/29/14 0315 08/30/14 0500 08/31/14 0300  Weight: 81.8 kg (180 lb 5.4 oz) 81.2 kg (179 lb 0.2 oz) 81.6 kg (179 lb 14.3 oz)    Intake/Output: I/O last 3 completed shifts: In: 1740 [P.O.:640; I.V.:600; IV Piggyback:500] Out: 2355 [Urine:525; Chest Tube:1025]   Intake/Output this shift:    Confused and not following commands, appears agitated CVS- RRR RS- CTA ABD- BS present soft non-distended EXT- trace edema   Basic Metabolic Panel:  Recent Labs Lab 08/27/14 0357 08/28/14 0320 08/29/14 0415 08/30/14 0307 08/31/14 0220  NA 138 140 138 136* 137  K 4.2 3.8 3.5* 3.9 4.0  CL 97 98 97 95* 96  CO2 27 25 26 23 24   GLUCOSE 83 84 101* 104* 118*  BUN 70* 68* 68* 69* 74*  CREATININE 2.73* 2.69* 2.72* 2.88* 3.16*  CALCIUM 8.2* 8.0* 8.2* 8.0* 8.0*  PHOS 4.8* 4.7* 4.8* 5.8* 6.0*    Liver Function Tests:  Recent Labs Lab 08/27/14 0357 08/28/14 0320 08/29/14 0415 08/30/14 0307 08/31/14 0220  ALBUMIN 1.9* 1.8* 1.8* 1.7* 1.8*   No results found for this basename: LIPASE, AMYLASE,  in the last 168 hours No results found for this basename: AMMONIA,  in the last 168 hours  CBC:  Recent Labs Lab 08/27/14 0357 08/28/14 0320 08/29/14 0415 08/30/14 0307 08/31/14 0220  WBC 5.2 4.4 4.5 6.0 7.2  HGB 9.1* 9.1* 9.2* 9.6* 9.1*  HCT 29.1* 28.4* 29.0* 31.0* 29.6*  MCV 100.0 99.6 99.7 101.6* 105.0*  PLT 29* 25* 21* 24* 21*     Cardiac Enzymes: No results found for this basename: CKTOTAL, CKMB, CKMBINDEX, TROPONINI,  in the last 168 hours  BNP: No components found with this basename: POCBNP,   CBG:  Recent Labs Lab 08/30/14 1200 08/30/14 1642 08/30/14 2109 08/30/14 2113 08/31/14 0756  GLUCAP 116* 145* 148* 142* 108*    Microbiology: Results for orders placed during the hospital encounter of 07/07/2014  CULTURE, BLOOD (ROUTINE X 2)     Status: None   Collection Time    07/28/2014  3:30 PM      Result Value Ref Range Status   Specimen Description BLOOD LEFT ARM   Final   Special Requests BOTTLES DRAWN AEROBIC AND ANAEROBIC [facility]   Final   Culture  Setup Time     Final   Value: 07/25/2014 19:23     Performed at [facility]   Culture     Final   Value: ENTEROCOCCUS SPECIES     Note: SUSCEPTIBILITIES PERFORMED ON PREVIOUS CULTURE WITHIN THE LAST 5 DAYS.     Note: Gram Stain Report Called to,Read Back By and Verified With: JAMES ARTIS 07/07/14 AT 0730 [place]     Performed at [facility]   Report Status 07/09/2014 FINAL   Final  URINE CULTURE     Status: None   Collection Time    07/31/2014  4:18 PM      Result Value Ref Range Status   Specimen Description URINE, CLEAN CATCH   Final   Special Requests NONE   Final   Culture  Setup Time     Final   Value: 07/07/2014 02:46     Performed at [place]     Final   Value: 35,000 COLONIES/ML     Performed at [facility]   Culture     Final   Value: ENTEROBACTER CLOACAE     Performed at [facility]   Report Status 07/09/2014 FINAL   Final   Organism ID, Bacteria ENTEROBACTER CLOACAE   Final  CULTURE, BLOOD (ROUTINE X 2)     Status: None   Collection Time    07/07/2014  4:30 PM      Result Value Ref Range Status   Specimen Description BLOOD R ARM   Final   Special Requests BOTTLES DRAWN AEROBIC AND ANAEROBIC [facility] EACH   Final   Culture  Setup Time     Final   Value: 08/01/2014 19:22      Performed at [facility]   Culture     Final   Value: ENTEROCOCCUS SPECIES     Note: Gram Stain Report Called to,Read Back By and Verified With: JAMES ARTIS 07/07/14 AT 0730 [place]     Performed at [facility]   Report Status 07/09/2014 FINAL   Final   Organism ID, Bacteria ENTEROCOCCUS SPECIES   Final  MRSA PCR SCREENING     Status: None   Collection Time    07/08/2014  7:52 PM      Result Value Ref Range Status   MRSA by PCR NEGATIVE  NEGATIVE Final   Comment:            The GeneXpert MRSA Assay (FDA     approved for NASAL specimens     only), is one component of a     comprehensive MRSA colonization     surveillance program. It is not     intended to diagnose MRSA     infection nor to guide or     monitor treatment for     MRSA infections.  CULTURE, BLOOD (ROUTINE X 2)     Status: None   Collection Time    07/08/14 12:00 PM      Result Value Ref Range Status   Specimen Description BLOOD RIGHT HAND   Final   Special Requests BOTTLES DRAWN AEROBIC ONLY [facility]   Final   Culture  Setup Time     Final   Value: 07/08/2014 17:14     Performed at [facility]   Culture     Final   Value: NO GROWTH 5 DAYS     Performed at [facility]   Report Status 07/23/2014 FINAL   Final  CULTURE, BLOOD (ROUTINE X 2)     Status: None   Collection Time    07/08/14 12:10 PM      Result Value Ref Range Status   Specimen Description BLOOD LEFT HAND   Final   Special Requests BOTTLES DRAWN AEROBIC ONLY [facility]   Final   Culture  Setup Time     Final   Value: 07/08/2014 17:05     Performed at [facility]   Culture     Final   Value: NO GROWTH 5 DAYS     Performed at [facility]   Report Status 08/02/2014 FINAL   Final  MRSA PCR SCREENING     Status: None   Collection Time    08/03/2014  6:17 PM      Result Value Ref Range Status   MRSA by PCR NEGATIVE  NEGATIVE Final   Comment:            The GeneXpert MRSA Assay (FDA     approved for NASAL specimens      only), is one component of a     comprehensive MRSA colonization     surveillance program. It is not     intended to diagnose MRSA     infection nor to guide or     monitor treatment for     MRSA infections.  SURGICAL PCR SCREEN     Status: None   Collection Time    07/21/14 11:05 AM      Result Value Ref Range Status   MRSA, PCR NEGATIVE  NEGATIVE Final   Staphylococcus aureus NEGATIVE  NEGATIVE Final   Comment:            The Xpert SA Assay (FDA     approved for NASAL specimens     in patients over 59 years of age),     is one component of     a comprehensive surveillance     program.  Test performance has     been validated by [employer] for patients greater     than or equal to 22 year old.     It is not intended     to diagnose infection nor to     guide or monitor treatment.  GRAM STAIN     Status: None   Collection Time    07/17/2014  9:36 AM      Result Value Ref Range Status   Specimen Description TISSUE   Final   Special Requests AORTIC VALVE VEGETATION HEART PT ON ZINACEF VANCO   Final   Gram Stain     Final   Value: FEW WBC PRESENT,BOTH PMN AND MONONUCLEAR     ABUNDANT GRAM POSITIVE COCCI IN PAIRS   Report Status 07/25/2014 FINAL   Final  TISSUE CULTURE     Status: None   Collection Time    07/15/2014  9:36 AM      Result Value Ref Range Status   Specimen Description TISSUE OTHER   Final   Special Requests AORTIC VALVE VEGETATION   Final   Gram Stain     Final   Value: NO WBC SEEN     NO SQUAMOUS EPITHELIAL CELLS SEEN     NO ORGANISMS SEEN     Performed at [facility]   Culture     Final   Value: FEW ENTEROCOCCUS SPECIES     Performed at [facility]   Report Status 07/29/2014 FINAL   Final   Organism ID, Bacteria ENTEROCOCCUS SPECIES   Final  ANAEROBIC CULTURE     Status: None   Collection Time    07/05/2014  9:36 AM      Result Value Ref Range Status   Specimen Description TISSUE OTHER   Final   Special Requests AORTIC VALVE  VEGETATION   Final   Gram Stain     Final   Value: NO WBC SEEN     NO SQUAMOUS EPITHELIAL CELLS SEEN     NO ORGANISMS SEEN  Performed at [facility]     Final   Value: NO ANAEROBES ISOLATED     Performed at [facility]   Report Status 07/29/2014 FINAL   Final  CLOSTRIDIUM DIFFICILE BY PCR     Status: None   Collection Time    07/21/2014  6:18 AM      Result Value Ref Range Status   C difficile by pcr NEGATIVE  NEGATIVE Final  CULTURE, BLOOD (ROUTINE X 2)     Status: None   Collection Time    08/11/14  8:35 AM      Result Value Ref Range Status   Specimen Description BLOOD RIGHT HAND   Final   Special Requests BOTTLES DRAWN AEROBIC ONLY [facility]   Final   Culture  Setup Time     Final   Value: 08/11/2014 14:03     Performed at [facility]   Culture     Final   Value: NO GROWTH 5 DAYS     Performed at [facility]   Report Status 08/17/2014 FINAL   Final  CULTURE, BLOOD (ROUTINE X 2)     Status: None   Collection Time    08/11/14  8:45 AM      Result Value Ref Range Status   Specimen Description BLOOD RIGHT HAND   Final   Special Requests BOTTLES DRAWN AEROBIC ONLY [facility]   Final   Culture  Setup Time     Final   Value: 08/11/2014 14:03     Performed at [facility]   Culture     Final   Value: CANDIDA PARAPSILOSIS     Note: Gram Stain Report Called to,Read Back By and Verified With: CRYSTAL RICE 08/13/14 1120 BY SMITHERSJ     Performed at [facility]   Report Status 08/15/2014 FINAL   Final  URINE CULTURE     Status: None   Collection Time    08/11/14 11:44 AM      Result Value Ref Range Status   Specimen Description URINE, CATHETERIZED   Final   Special Requests Immunocompromised   Final   Culture  Setup Time     Final   Value: 08/11/2014 17:21     Performed at [place]     Final   Value: 65,000 COLONIES/ML     Performed at [facility]   Culture     Final   Value: CANDIDA  ALBICANS     Performed at [facility]   Report Status 08/18/2014 FINAL   Final  AFB CULTURE WITH SMEAR     Status: None   Collection Time    08/12/14  4:33 PM      Result Value Ref Range Status   Specimen Description FLUID RIGHT PLEURAL   Final   Special Requests Normal   Final   Acid Fast Smear     Final   Value: NO ACID FAST BACILLI SEEN     Performed at [facility]   Culture     Final   Value: CULTURE WILL BE EXAMINED FOR 6 WEEKS BEFORE ISSUING A FINAL REPORT     Performed at [facility]   Report Status PENDING   Incomplete  BODY FLUID CULTURE     Status: None   Collection Time    08/12/14  4:42 PM      Result Value Ref Range Status  Specimen Description FLUID RIGHT PLEURAL   Final   Special Requests Normal   Final   Gram Stain     Final   Value: RARE WBC PRESENT,BOTH PMN AND MONONUCLEAR     NO ORGANISMS SEEN     Performed at [facility]   Culture     Final   Value: NO GROWTH 3 DAYS     Performed at [facility]   Report Status 08/16/2014 FINAL   Final  FUNGUS CULTURE W SMEAR     Status: None   Collection Time    08/12/14  4:52 PM      Result Value Ref Range Status   Specimen Description FLUID RIGHT PLEURAL   Final   Special Requests NONE   Final   Fungal Smear     Final   Value: NO YEAST OR FUNGAL ELEMENTS SEEN     Performed at [facility]   Culture     Final   Value: CULTURE IN PROGRESS FOR FOUR WEEKS     Performed at [facility]   Report Status PENDING   Incomplete  BODY FLUID CULTURE     Status: None   Collection Time    08/13/14  3:00 PM      Result Value Ref Range Status   Specimen Description PLEURAL FLUID LEFT   Final   Special Requests NONE   Final   Gram Stain     Final   Value: NO WBC SEEN     NO ORGANISMS SEEN     Performed at [facility]   Culture     Final   Value: NO GROWTH 3 DAYS     Performed at [facility]   Report Status 08/16/2014 FINAL   Final  CULTURE,  EXPECTORATED SPUTUM-ASSESSMENT     Status: None   Collection Time    08/13/14  6:02 PM      Result Value Ref Range Status   Specimen Description SPUTUM   Final   Special Requests Immunocompromised   Final   Sputum evaluation     Final   Value: MICROSCOPIC FINDINGS SUGGEST THAT THIS SPECIMEN IS NOT REPRESENTATIVE OF LOWER RESPIRATORY SECRETIONS. PLEASE RECOLLECT.     Loletha Carrow RN 0102 08/13/14 [facility]   Report Status 08/13/2014 FINAL   Final  CULTURE, BLOOD (ROUTINE X 2)     Status: None   Collection Time    08/14/14 12:21 PM      Result Value Ref Range Status   Specimen Description BLOOD RIGHT FOREARM   Final   Special Requests BOTTLES DRAWN AEROBIC AND ANAEROBIC [facility]   Final   Culture  Setup Time     Final   Value: 08/14/2014 15:48     Performed at [facility]   Culture     Final   Value: NO GROWTH 5 DAYS     Performed at [facility]   Report Status 08/28/2014 FINAL   Final  CULTURE, BLOOD (ROUTINE X 2)     Status: None   Collection Time    08/14/14  3:42 PM      Result Value Ref Range Status   Specimen Description BLOOD HEMODIALYSIS LINE   Final   Special Requests BOTTLES DRAWN AEROBIC AND ANAEROBIC [facility]   Final   Culture  Setup Time     Final   Value: 08/14/2014 18:57     Performed at [facility]  Final   Value: NO GROWTH 5 DAYS     Performed at [facility]   Report Status 08/18/2014 FINAL   Final  MRSA PCR SCREENING     Status: None   Collection Time    08/17/14 11:42 AM      Result Value Ref Range Status   MRSA by PCR NEGATIVE  NEGATIVE Final   Comment:            The GeneXpert MRSA Assay (FDA     approved for NASAL specimens     only), is one component of a     comprehensive MRSA colonization     surveillance program. It is not     intended to diagnose MRSA     infection nor to guide or     monitor treatment for     MRSA infections.  URINE CULTURE     Status: None   Collection Time    08/26/14  2:09  PM      Result Value Ref Range Status   Specimen Description URINE, RANDOM   Final   Special Requests NONE   Final   Culture  Setup Time     Final   Value: 08/26/2014 23:43     Performed at [place]     Final   Value: NO GROWTH     Performed at [facility]   Culture     Final   Value: NO GROWTH     Performed at [facility]   Report Status 08/27/2014 FINAL   Final    Coagulation Studies:  Recent Labs  08/29/14 0415 08/30/14 0307 08/31/14 0220  LABPROT 16.1* 17.3* 19.7*  INR 1.29 1.41 1.67*    Urinalysis: No results found for this basename: COLORURINE, APPERANCEUR, LABSPEC, PHURINE, GLUCOSEU, HGBUR, BILIRUBINUR, KETONESUR, PROTEINUR, UROBILINOGEN, NITRITE, LEUKOCYTESUR,  in the last 72 hours    Imaging: Dg Chest Port 1 View  08/31/2014   CLINICAL DATA:  Followup pleural effusions.  EXAM: PORTABLE CHEST - 1 VIEW  COMPARISON:  08/24/2014  FINDINGS: Right pacer remains in place, unchanged. Left pleural catheter is unchanged. Heart is borderline in size. Mild perihilar airspace opacities have slightly increased since prior study and may reflect early edema. No effusions or pneumothorax. No acute bony abnormality.  IMPRESSION: Slight increase mild perihilar airspace opacities, question edema.   Electronically Signed   By: Rolm Baptise M.D.   On: 08/31/2014 07:12     Medications:     . ampicillin (OMNIPEN) IV  2 g Intravenous 3 times per day  . antiseptic oral rinse  7 mL Mouth Rinse BID  . aspirin EC  81 mg Oral Daily  . atorvastatin  10 mg Oral QPM  . cefTRIAXone (ROCEPHIN)  IV  2 g Intravenous Q12H  . feeding supplement (ENSURE COMPLETE)  237 mL Oral BID BM  . fluconazole (DIFLUCAN) IV  200 mg Intravenous Q24H  . folic acid  2 mg Intravenous Daily  . IMMUNE GLOBULIN 10% (HUMAN) IV - For Fluid Restriction Only  400 mg/kg Intravenous Q24H  . insulin aspart  0-15 Units Subcutaneous TID WC  . insulin aspart  0-5 Units Subcutaneous  QHS  . pantoprazole (PROTONIX) IV  40 mg Intravenous Q24H  . pyridOXINE  100 mg Oral QPM  . vitamin B-12  1,000 mcg Oral QPM  . vitamin E  400 Units Oral Daily  . warfarin  2 mg Oral q1800  . Warfarin -  Physician Dosing Inpatient   Does not apply q1800   acetaminophen, docusate sodium, levalbuterol, ondansetron (ZOFRAN) IV, ondansetron (ZOFRAN) IV, ondansetron, promethazine, sodium chloride, traMADol, zolpidem  Assessment/ Plan:  Assessment  1. AoCKD (BL SCr 1.5) 2/2 ATN, off HD since 08/19/14; Stable UOP will stop IV lasix with Stable GFR w/ good electrolyte control.  2. Anemia, multifactorial, off ESA 2/2 acute CVA 08/28/14 3. S/p AVR, MVR, CABG, MAZE: per TCTS and Cardiology 4. Enterococcal IE and Fungemia: [facility] following; on ampicillin/ceftriaxone 5. Pleural effusiosn w/ pleurex catheter 6. TCP, hematology following, started IVIG 08/27/14; s/p NPlate 7. 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 Poor PO intake after CVA  Will sign off and happy to be available for re consultation or  helpful suggestions   Edrick Oh  [phone]   LOS: 14 Liliauna Santoni W @TODAY @10 :40 AM

## 2014-08-31 NOTE — Progress Notes (Signed)
STROKE TEAM PROGRESS NOTE   HISTORY  Justin [name] is an 80 y.o. male admitted to hospital on 07/25/2014 for heart failure and hypotension. He is currently being followed by Dr. Marin Olp for ITP and treated with systemic steroids and was treated with IVIG. While in hospital he was noted to have both enterococcus and Enterobacter in his urine and blood. 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 On 07/21/2014 he underwent a Aortic root replacement, MV replacement, CABG and Maze procedure. While hospitalized he developed ATN and required hemodialysis. His kidney function improved and has been of HD since 08-19-2014. Patient was improving slowly. This AM night nurse check on him and placed him I the chair at 0700 hours. At that time he was his baseline. Morning nurse came to check on him and noted he was having difficulty expressing himself and on exam she noted new onset of right arm weakness. Code stroke was called. Patient was brought to CT where no acute infarct was found but did show bilateral cerebellar lacunar infarcts increased in number since August. Patient was not a tPA candidate due to PT >15 and PLT count of 25.   Date last known well: Date: 08/28/2014  Time last known well: Time: 07:00  tPA Given: No: PLT 25, PT >15.9    SUBJECTIVE (INTERVAL HISTORY) Family member are at bedside. He had difficulties with airway and not able to have PO access. Pt has living will, do not want to be intubated. TCTS is going to discuss with palliative care.     OBJECTIVE Temp:  [98.2 F (36.8 C)-99.5 F (37.5 C)] 98.8 F (37.1 C) (09/26 0820) Pulse Rate:  [78-88] 86 (09/26 0820) Cardiac Rhythm:  [-] Ventricular paced (09/25 2000) Resp:  [14-51] 31 (09/26 0820) BP: (114-151)/(49-100) 114/67 mmHg (09/26  0820) SpO2:  [90 %-100 %] 94 % (09/26 0820) Weight:  [180 lb 5.4 oz (81.8 kg)] 180 lb 5.4 oz (81.8 kg) (09/26 0315)   Recent Labs Lab 08/30/14 2109 08/30/14 2113 08/31/14 0756 08/31/14 1142 08/31/14 1624  GLUCAP 148* 142* 108* 101* 94    Recent Labs Lab 08/27/14 0357 08/28/14 0320 08/29/14 0415 08/30/14 0307 08/31/14 0220  NA 138 140 138 136* 137  K 4.2 3.8 3.5* 3.9 4.0  CL 97 98 97 95* 96  CO2 27 25 26 23 24   GLUCOSE 83 84 101* 104* 118*  BUN 70* 68* 68* 69* 74*  CREATININE 2.73* 2.69* 2.72* 2.88* 3.16*  CALCIUM 8.2* 8.0* 8.2* 8.0* 8.0*  PHOS 4.8* 4.7* 4.8* 5.8* 6.0*    Recent Labs Lab 08/27/14 0357 08/28/14 0320 08/29/14 0415 08/30/14 0307 08/31/14 0220  ALBUMIN 1.9* 1.8* 1.8* 1.7* 1.8*    Recent Labs Lab 08/27/14 0357 08/28/14 0320 08/29/14 0415 08/30/14 0307 08/31/14 0220  WBC 5.2 4.4 4.5 6.0 7.2  HGB 9.1* 9.1* 9.2* 9.6* 9.1*  HCT 29.1* 28.4* 29.0* 31.0* 29.6*  MCV 100.0 99.6 99.7 101.6* 105.0*  PLT 29* 25* 21* 24* 21*   No results found for this basename: CKTOTAL, CKMB, CKMBINDEX, TROPONINI,  in the last 168 hours  Recent Labs  08/29/14 0415 08/30/14 0307 08/31/14 0220  LABPROT 16.1* 17.3* 19.7*  INR 1.29 1.41 1.67*   No results found for this basename: COLORURINE, APPERANCEUR, LABSPEC, PHURINE, GLUCOSEU, HGBUR, BILIRUBINUR, KETONESUR, PROTEINUR, UROBILINOGEN, NITRITE, LEUKOCYTESUR,  in the last 72 hours  Component Value Date/Time   CHOL 125 08/12/2014 1600   TRIG 66 01/13/2014 0849   HDL 49 01/13/2014 0849   CHOLHDL 2.6 01/13/2014 0849   VLDL 13 01/13/2014 0849   LDLCALC 64 01/13/2014 0849   Lab Results  Component Value Date   HGBA1C 5.4 07/21/2014   No results found for this basename: labopia,  cocainscrnur,  labbenz,  amphetmu,  thcu,  labbarb    No results found for this basename: ETH,  in the last 168 hours  Ct Head Wo Contrast 08/28/2014 1. Bilateral cerebellar lacunar infarcts, appear increased in number since August.  2. No  other acute intracranial abnormality identified.     PHYSICAL EXAM GENERAL: restless, breathing heavy, globally weak  HEENT: normal  Lung: bilateral coarse rhonchi  ABDOMEN: soft  EXTREMITIES: No edema   SKIN: Normal by inspection.    MENTAL STATUS: Awake but restless. Marked dysarthria. Not following commands.    CRANIAL NERVES: Pupils are equal, round and reactive to light and accomodation; extra ocular movements are full, there is no significant nystagmus; difficulty with visual field testing, eyes not fully open.  there is flattening of the nasolabial fold-R; tongue is midline.  MOTOR: Atrophy -R FDI, both arms 4-/5; legs 5/5  COORDINATION: Left finger to nose is normal, right finger to nose is normal, No rest tremor; no intention tremor; no postural tremor; no bradykinesia.   ASSESSMENT/PLAN  [name] is a 80 y.o. male with history of coronary artery disease, dyslipidemia, hypertension, atrial fibrillation, permanent pacemaker, and obstructive sleep apnea presenting with right upper extremity weakness.Marland Kitchen He did not receive TPA secondary to thrombocytopenia.  Imaging confirms bilateral cerebellar lacunar infarcts, appear increased in number since August felt to be embolic. Pt has multiple complicated medical condition and clinically deteriorating, pt has living wills and does not want to be intubated. Family is moving forward for hospice care.   Stroke - bilateral cerebellar lacunar infarcts believed to be embolic secondary to atrial fibrillation. On coumadin but INR subtherapeutic, however no po access. Pt does not want intubation or tube insertion.   CT repeat not able to be done due to moving forward to palliative care  Carotid Doppler - 07/11/2014 - The vertebral arteries appear patent with antegrade flow.- Findings consistent with 1- 39 percent stenosis involving the right internal carotid artery and the left internal carotid artery. Suggestive of 50-99% stenosis of  the right subclavian artery, possibly also more proximal into the innominate artery, based on turbulent flow. Left subclavian artery is within normal limits.                                 2D Echo - 08/14/2014 - ejection fraction 35-40%  no antithrombotics prior to admission, now on warfarin... But no po access.    LDL 64 - on Lipitor prior to admission  HgbA1c 5.4 WNL  Hypertension   Permissive hypertension <220/120 for 24-48 hours and then gradually normalize within 5-7 days  BP goal long term normotensive  Stable  Hyperlipidemia  Home meds:  Lipitor - resumed in hospital.  LDL 64, goal < 100 (<70 for diabetics)  Continue statin at discharge  Diabetes  HgbA1c 5.4, Goal < 7.0  Controlled  Other Stroke Risk Factors Advanced age ETOH use    Family hx stroke - mother and brother   Coronary artery disease  Other Active Problems  Anemia - stable -  H/H 9.6/31  Renal insufficiency  Other Pertinent History  ITP - (idiopathic thrombocytopenic purpura) Dr Marin Olp - Plts 21K  WPW  On 07/31/2014 he underwent a Aortic root replacement, MV replacement, CABG and Maze procedure.  History of endocarditis  Permanent pacemaker  Warfarin therapy - INR not at goal but no po access  Pt currently clinically deteriorating and he has living will. Family request palliative care as per pt wishes. Primary service is arranging for inpt hospice care.   Neurology will sign off. Please call with questions.  Hospital day # 77  Rosalin Hawking, MD PhD Stroke Neurology 08/31/2014 8:09 PM   To contact Stroke Continuity provider, please refer to [email or web address] After hours, contact General Neurology

## 2014-09-03 NOTE — Progress Notes (Signed)
Advised daughter about 1 am that patients HR had decreased from 90's to 60's with decrease in oxygen sats. Pt unresponsive. 0120 At patient bedside with family when patient died. Julien Girt, RN and I listened for 2 minutes with no heart or breath sounds auscultated. Asystole on monitor. Emotional support provided to family at bedside.

## 2014-09-03 NOTE — Discharge Summary (Signed)
[address]       [place] [address]             [phone]              Death Summary  Name: Justin [name] DOB: Apr 16, 1933 80 y.o. MRN: [id number]   Admission Date: 07/12/2014 Discharge Date: Sep 29, 2014    Admitting Diagnosis: Principal Problem:   Bacterial endocarditis - Enterococcus sepsis with aortic valve vegetation Active Problems:   CAD (coronary artery disease)   HTN (hypertension)   Atrial fibrillation   Anemia   Thrombocytopenia   Obstructive sleep apnea   Chronotropic incompetence with sinus node dysfunction   Mitral regurgitation   SOB (shortness of breath)   CHF (congestive heart failure)   Diastolic congestive heart failure   Sepsis   Enterococcal bacteremia   TIA (transient ischemic attack)   Aortic insufficiency   Chronic kidney disease   S/P aortic valve replacement with stentless valve   S/P mitral valve replacement with bioprosthetic valve   S/P CABG x 1   S/P Maze operation for atrial fibrillation   Acute renal failure superimposed on stage 3 chronic kidney disease   Acute on chronic diastolic heart failure   Fungemia   Pleural effusions, bilateral, recurrent   Acute on chronic diastolic ACC/AHA stage C congestive heart failure    Final Diagnoses:  Principal Problem:   Bacterial endocarditis - Enterococcus sepsis with aortic valve vegetation Active Problems:   CAD (coronary artery disease)   HTN (hypertension)   Atrial fibrillation   Anemia   Thrombocytopenia   Obstructive sleep apnea   Chronotropic incompetence with sinus node dysfunction   Mitral regurgitation   SOB (shortness of breath)   CHF (congestive heart failure)   Diastolic congestive heart failure   Sepsis   Enterococcal bacteremia   TIA (transient ischemic attack)   Aortic insufficiency   Chronic kidney disease   S/P aortic valve replacement with stentless valve   S/P mitral valve replacement with bioprosthetic valve   S/P CABG x 1  S/P Maze operation for atrial fibrillation   Acute renal failure superimposed on stage 3 chronic kidney disease   Acute on chronic diastolic heart failure   Candida parapsilosis/Candida albicans fungemia   Pleural effusions, bilateral, recurrent   Acute on chronic diastolic ACC/AHA stage C congestive heart failure Postoperative complete heart block Expected postoperative blood loss anemia Bilateral recurrent pleural effusions Bilateral cerebellar lacunar infarcts     Procedures: Aortic Root Replacement   - 07/26/2014  Medtronic Freestyle Aortic Root Heart Valve (size 42mm)  Reimplantation of Left Main and Right Coronary Arteries  Mitral Valve Replacement   Edwards Magna Mitral Bovine Bioprosthetic Tissue Valve (size 47mm) Coronary Artery Bypass Grafting   Reversed Saphenous Vein Graft to First Obtuse Marginal Branch of Left Circumflex Coronary Artery   Endoscopic Vein Harvest from Right Thigh  Maze Procedure   Complete bilateral atrial atrial lesion set using bipolar radiofrequency and cryothermy ablation   Clipping of left atrial appendage (Atriclip size 61mm)  Extraction of dual chamber pacing system -  07/25/2014  Placement of Medtronic dual chamber pacemaker - 07/30/2014  Placement of Bilateral PleurX Catheters  -  08/08/2014   HPI:  The patient is a 80 y.o. male with a complicated past medical history, including known history of chronic mitral regurgitation, atrial fibrillation, sinus node dysfunction and tachybrady syndrome with previous pacemaker placement, and WPW syndrome.  Prior to admission to the hospital, the  patient had a prolonged illness. He initially noted dark stools and underwent colonoscopy which demonstrated some small polyps which were removed. Upper endoscopy was unremarkable. The patient then developed symptoms of shortness of breath and productive cough and was given a course of antibiotics. His appetite worsened and he developed intermittent fevers,  weight loss, then progressive to severe thrombocytopenia. A bone marrow biopsy was performed which was unremarkable. The patient presented to the ER on the date of admission after acutely developing fever and hypotension with his IV Ig treatment.  He was found to have a leukocytosis and lactic acidosis and was admitted to the MICU with suspected sepsis.    Hospital Course:  The patient was admitted to [facility] on 07/07/2014. He was started on broad spectrum antibiotic therapy.  Blood cultures ultimately grew out Enterococcus, and urine cultures were positive for Enterobacter.  CTs of the brain, chest and abdomen/pelvis were negative.   An echocardiogram was performed and revealed moderate to severe mitral regurgitation with no obvious vegetation.  It was suspected that his permanent pacemaker could be the source of infection, and he was seen by EP cardiology for consideration of removal.  A TEE was performed which revealed normal LV function, severe MR, large aortic valve vegetation with moderate AI, and no pacemaker vegetation.  TCTS was consulted to evaluate his endocarditis.  It was felt that he would benefit from aortic valve replacement and mitral valve repair/replacement after further antibiotic therapy, after removal of his pacemaker, and once his platelets had improved.  The patient underwent pacemaker extraction on 07/24/2014. Repeat blood cultures and dental evaluation were negative.  Cardiac catheterization was performed on 8/14 and revealed single vessel coronary artery disease. He improved clinically with antibiotic therapy and platelet transfusion, and it was felt safe to proceed with surgery. All risks, benefits and alternatives of surgery were explained in detail, and the patient agreed to proceed.   The patient was taken to the operating room and underwent the above procedure. Intraoperative findings included active endocarditis with a large vegetation adherent to the aortic and mitral valves,  with perforation of the left cusp of the aortic valve, iIncomplete perforation of anterior leaflet of the mitral valve, and a small aortic root requiring porcine stentless root replacement. He was severely coagulopathic at the conclusion of the procedure.  The postoperative course was complicated and prolonged by multiple issues.  The patient developed pacer dependent complete heart block. He was evaluated by EP cardiology and a permanent pacemaker was replaced on 07/15/2014.  He developed oliguric acute on chronic renal failure and was followed by nephrology. He was treated conservatively initially with diuresis and renal dose Dopamine and renal function and urine output slowly improved. Lasix and Dopamine were both weaned and discontinued.  He was transferred to the floor on postop day 8.  He was continued on Ampicillin and Rocephin for endocarditis per Infectious Disease recommendations.    He initially made slow but steady progress, but his weight began to trend upward with increased lower extremity edema and worsening creatinine. He was restarted on IV Lasix without much improvement.  A repeat echo was performed and revealed normal LV function, normal bioprosthetic valves, normal RV size and function with moderate tricuspid regurgitation. The advanced heart failure team was consulted to assist with management and he was transferred back to the stepdown unit for monitoring. He was started on milrinone and dopamine drips as well as continued aggressive diuresis. Despite these measures, he remained massively volume overloaded and  was started on CRRT via a tunneled catheter. He developed large bilateral pleural effusions and underwent right thoracentesis at the bedside on 9/9, yielding 1350 ml bloody fluid. A left thoracentesis was performed on 9/10 yielding 1050 ml serosanguinous fluid.  Urine culture and 1 of 2 sets of blood cultures grew candida parapsilosis and candida albicans, presumably a catheter related  infection, and the patient was started on antifungals. His Foley was removed and all lines were changed. The patient had recurrent atrial fibrillation requiring an Amiodarone drip. Platelet count waxed and waned over his entire hospital course. He remained extremely weak and deconditioned despite physical therapy. Bilateral pleural effusions recurred and bilateral PleuRx catheters were placed on 9/17. Renal function stabilized. He actually began to feel symptomatically better though he remained extremely weak and debilitated.  On 9/25, the patient developed acute onset dysarthria and right arm weakness.  He was seen by the stroke team and a CT was performed which did not show an acute infarct, but did show bilateral cerebellar lacunar infarcts increased since August.  He was not felt to be a tpa candidate due to thrombocytopenia, and was continued on Coumadin. His neurologic condition continued to deteriorate, and he was made a DNR on 9/26. Dr. Roxy Manns met with the family on 9/28, and after a lengthy discussion, it was elected to transition him to palliative care only, and he expired at 1:20 am on 2014-09-11.  The final cause of death was acute stroke.     Recent vital signs:  Filed Vitals:   2014-09-11 0120  BP:   Pulse:   Temp:   Resp: 0    Recent laboratory studies:  CBC: Recent Labs  08/30/14 0307 08/31/14 0220  WBC 6.0 7.2  HGB 9.6* 9.1*  HCT 31.0* 29.6*  PLT 24* 21*   BMET:  Recent Labs  08/30/14 0307 08/31/14 0220  NA 136* 137  K 3.9 4.0  CL 95* 96  CO2 23 24  GLUCOSE 104* 118*  BUN 69* 74*  CREATININE 2.88* 3.16*  CALCIUM 8.0* 8.0*    PT/INR:  Recent Labs  08/31/14 0220  LABPROT 19.7*  INR 1.67*      COLLINS,GINA H 09-11-14, 10:05 AM   I agree with the above discharge summary.  [name],[name]

## 2014-09-03 DEATH — deceased

## 2014-09-07 LAB — FUNGUS CULTURE W SMEAR: Fungal Smear: NONE SEEN

## 2014-09-27 LAB — AFB CULTURE WITH SMEAR (NOT AT ARMC)
Acid Fast Smear: NONE SEEN
Special Requests: NORMAL

## 2014-11-12 ENCOUNTER — Encounter (HOSPITAL_COMMUNITY): Payer: Self-pay | Admitting: Internal Medicine

## 2014-12-17 ENCOUNTER — Encounter (HOSPITAL_COMMUNITY): Payer: Self-pay | Admitting: Internal Medicine

## 2015-02-25 ENCOUNTER — Other Ambulatory Visit: Payer: Self-pay | Admitting: Family

## 2015-10-03 IMAGING — CR DG CHEST 1V PORT
1 series · 1 of 1 positions shown · non-contrast
Comparison: 08/11/2014; 08/10/2014; 08/03/2014

CLINICAL DATA: CHF, wheezing, shortness of breath

EXAM:
PORTABLE CHEST - 1 VIEW

[AP]
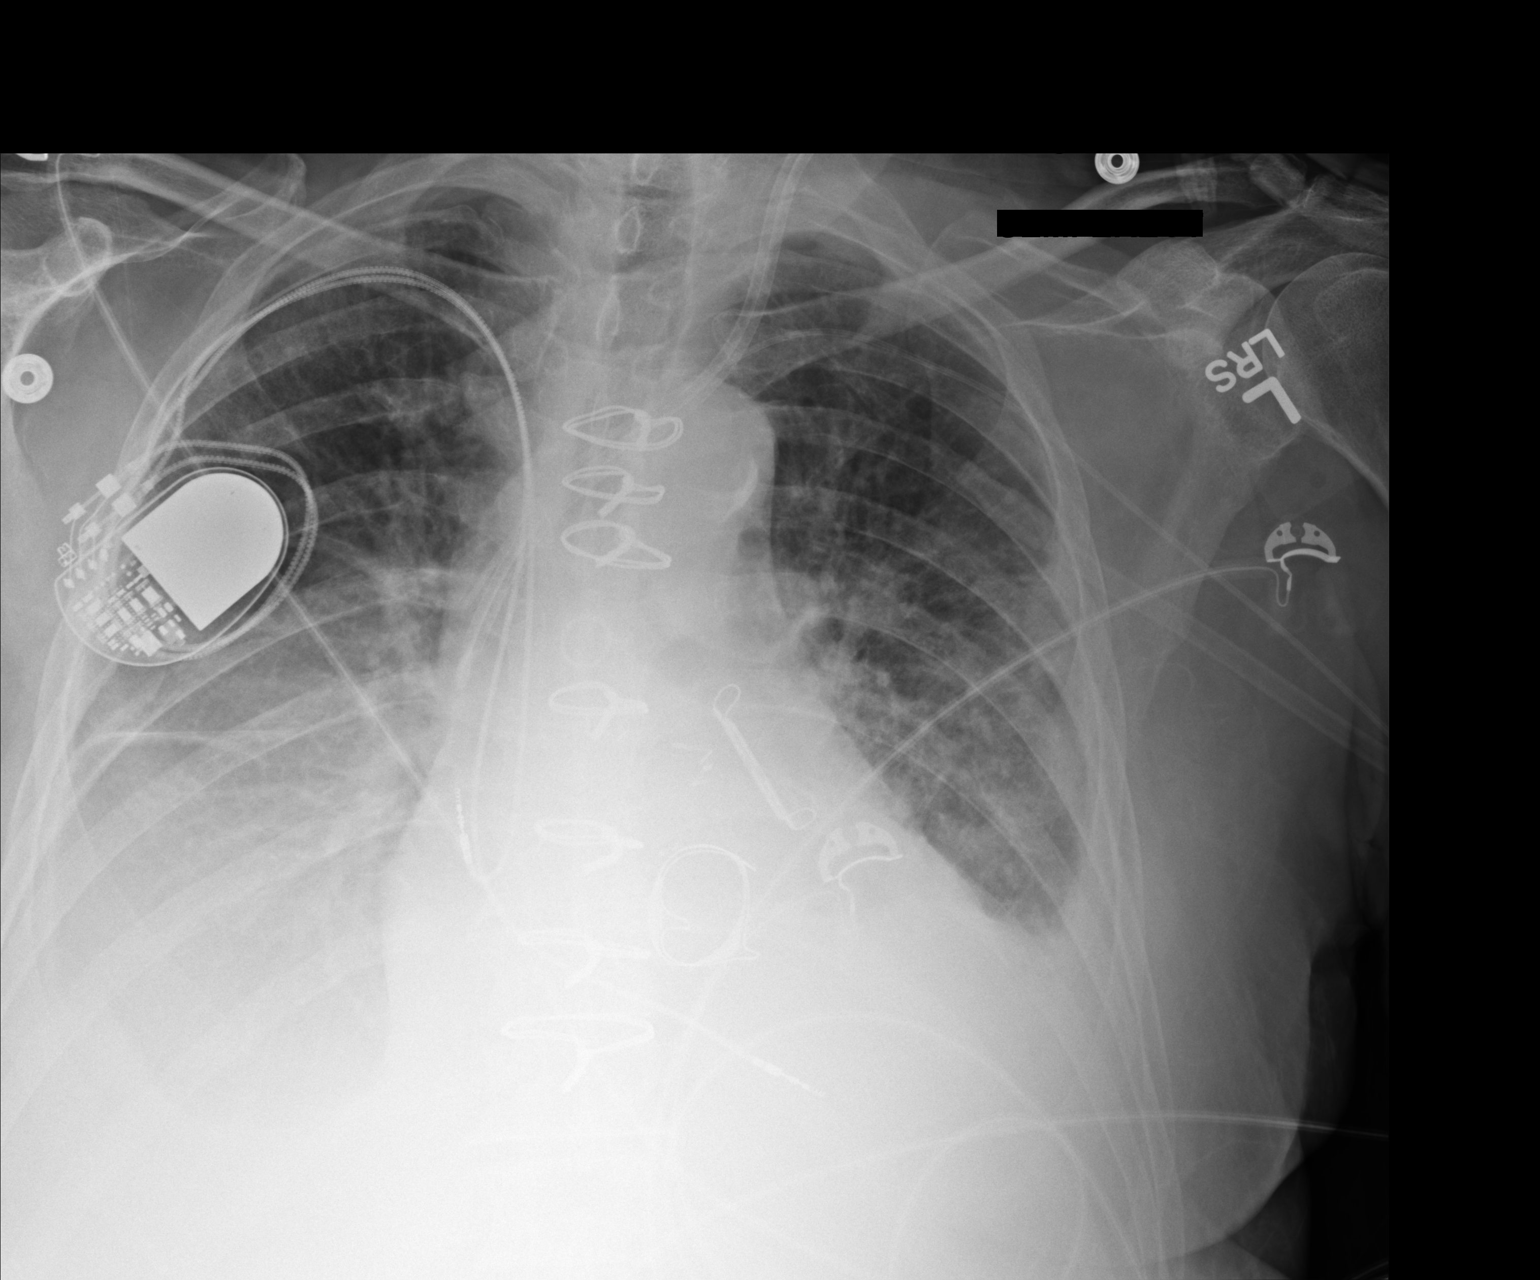

[1 of 1 positions shown; findings below may reference images not displayed]

FINDINGS: Grossly unchanged enlarged cardiac silhouette and mediastinal
contours with atherosclerotic plaque within the thoracic aorta. Post
median sternotomy, CABG and aortic valve replacement. Stable
position of support apparatus. Minimally improved aeration of the
lungs with persistent cephalization of flow. Unchanged layering
effusions and bibasilar opacities, left greater than right,
atelectasis versus infiltrate. No new focal airspace opacities.
Unchanged bones.
IMPRESSION: 1.  Stable positioning of support apparatus.  No pneumothorax.
2. Minimally improved aeration of lungs with persistent findings of
pulmonary venous congestion, layering bilateral effusions and
associated bibasilar opacities, atelectasis versus infiltrate.
